# Patient Record
Sex: Male | Born: 1970 | Race: White | Hispanic: No | Marital: Married | Smoking: Never smoker
Health system: Southern US, Community
[De-identification: ages and names within clinical notes are randomized; demographics above are authoritative.]

## PROBLEM LIST (undated history)

## (undated) DIAGNOSIS — E119 Type 2 diabetes mellitus without complications: Secondary | ICD-10-CM

## (undated) DIAGNOSIS — F32A Depression, unspecified: Secondary | ICD-10-CM

## (undated) DIAGNOSIS — K219 Gastro-esophageal reflux disease without esophagitis: Secondary | ICD-10-CM

## (undated) DIAGNOSIS — N189 Chronic kidney disease, unspecified: Secondary | ICD-10-CM

## (undated) DIAGNOSIS — J189 Pneumonia, unspecified organism: Secondary | ICD-10-CM

## (undated) DIAGNOSIS — F419 Anxiety disorder, unspecified: Secondary | ICD-10-CM

## (undated) DIAGNOSIS — D649 Anemia, unspecified: Secondary | ICD-10-CM

## (undated) DIAGNOSIS — Z8489 Family history of other specified conditions: Secondary | ICD-10-CM

## (undated) DIAGNOSIS — Z9289 Personal history of other medical treatment: Secondary | ICD-10-CM

## (undated) DIAGNOSIS — I10 Essential (primary) hypertension: Secondary | ICD-10-CM

## (undated) DIAGNOSIS — I4891 Unspecified atrial fibrillation: Secondary | ICD-10-CM

## (undated) DIAGNOSIS — U071 COVID-19: Secondary | ICD-10-CM

## (undated) HISTORY — PX: VITRECTOMY: SHX106

## (undated) HISTORY — PX: TRACHEOSTOMY: SUR1362

---

## 2008-11-12 ENCOUNTER — Encounter: Admission: RE | Admit: 2008-11-12 | Discharge: 2008-11-12 | Payer: Self-pay | Admitting: Emergency Medicine

## 2021-05-19 ENCOUNTER — Emergency Department (HOSPITAL_COMMUNITY): Payer: BC Managed Care – PPO

## 2021-05-19 ENCOUNTER — Inpatient Hospital Stay (HOSPITAL_COMMUNITY)
Admission: EM | Admit: 2021-05-19 | Discharge: 2021-05-21 | DRG: 065 | Disposition: A | Discharge status: Home / Self Care | Payer: BC Managed Care – PPO | Attending: Internal Medicine | Admitting: Internal Medicine

## 2021-05-19 DIAGNOSIS — E119 Type 2 diabetes mellitus without complications: Secondary | ICD-10-CM

## 2021-05-19 DIAGNOSIS — E872 Acidosis: Secondary | ICD-10-CM | POA: Diagnosis present

## 2021-05-19 DIAGNOSIS — I639 Cerebral infarction, unspecified: Secondary | ICD-10-CM

## 2021-05-19 DIAGNOSIS — I6381 Other cerebral infarction due to occlusion or stenosis of small artery: Principal | ICD-10-CM | POA: Diagnosis present

## 2021-05-19 DIAGNOSIS — E785 Hyperlipidemia, unspecified: Secondary | ICD-10-CM | POA: Diagnosis present

## 2021-05-19 DIAGNOSIS — D649 Anemia, unspecified: Secondary | ICD-10-CM | POA: Diagnosis present

## 2021-05-19 DIAGNOSIS — I6521 Occlusion and stenosis of right carotid artery: Secondary | ICD-10-CM | POA: Diagnosis present

## 2021-05-19 DIAGNOSIS — E669 Obesity, unspecified: Secondary | ICD-10-CM | POA: Diagnosis present

## 2021-05-19 DIAGNOSIS — N179 Acute kidney failure, unspecified: Secondary | ICD-10-CM | POA: Diagnosis present

## 2021-05-19 DIAGNOSIS — Z6832 Body mass index (BMI) 32.0-32.9, adult: Secondary | ICD-10-CM

## 2021-05-19 DIAGNOSIS — E871 Hypo-osmolality and hyponatremia: Secondary | ICD-10-CM | POA: Diagnosis present

## 2021-05-19 DIAGNOSIS — Z20822 Contact with and (suspected) exposure to covid-19: Secondary | ICD-10-CM | POA: Diagnosis present

## 2021-05-19 DIAGNOSIS — R29700 NIHSS score 0: Secondary | ICD-10-CM | POA: Diagnosis present

## 2021-05-19 DIAGNOSIS — G8194 Hemiplegia, unspecified affecting left nondominant side: Secondary | ICD-10-CM | POA: Diagnosis present

## 2021-05-19 DIAGNOSIS — E1159 Type 2 diabetes mellitus with other circulatory complications: Secondary | ICD-10-CM

## 2021-05-19 DIAGNOSIS — Z7984 Long term (current) use of oral hypoglycemic drugs: Secondary | ICD-10-CM

## 2021-05-19 DIAGNOSIS — N289 Disorder of kidney and ureter, unspecified: Secondary | ICD-10-CM

## 2021-05-19 DIAGNOSIS — R29810 Facial weakness: Secondary | ICD-10-CM | POA: Diagnosis present

## 2021-05-19 DIAGNOSIS — Z7982 Long term (current) use of aspirin: Secondary | ICD-10-CM

## 2021-05-19 DIAGNOSIS — R809 Proteinuria, unspecified: Secondary | ICD-10-CM | POA: Diagnosis present

## 2021-05-19 DIAGNOSIS — Z823 Family history of stroke: Secondary | ICD-10-CM

## 2021-05-19 DIAGNOSIS — R1310 Dysphagia, unspecified: Secondary | ICD-10-CM | POA: Diagnosis present

## 2021-05-19 DIAGNOSIS — Z79899 Other long term (current) drug therapy: Secondary | ICD-10-CM

## 2021-05-19 DIAGNOSIS — I10 Essential (primary) hypertension: Secondary | ICD-10-CM | POA: Diagnosis present

## 2021-05-19 HISTORY — DX: Type 2 diabetes mellitus without complications: E11.9

## 2021-05-19 HISTORY — DX: Essential (primary) hypertension: I10

## 2021-05-19 HISTORY — DX: Cerebral infarction, unspecified: I63.9

## 2021-05-19 LAB — COMPREHENSIVE METABOLIC PANEL
ALT: 20 U/L (ref 0–44)
AST: 19 U/L (ref 15–41)
Albumin: 3.4 g/dL — ABNORMAL LOW (ref 3.5–5.0)
Alkaline Phosphatase: 94 U/L (ref 38–126)
Anion gap: 9 (ref 5–15)
BUN: 44 mg/dL — ABNORMAL HIGH (ref 6–20)
CO2: 20 mmol/L — ABNORMAL LOW (ref 22–32)
Calcium: 8.9 mg/dL (ref 8.9–10.3)
Chloride: 103 mmol/L (ref 98–111)
Creatinine, Ser: 4.6 mg/dL — ABNORMAL HIGH (ref 0.61–1.24)
GFR, Estimated: 15 mL/min — ABNORMAL LOW (ref >60)
Glucose, Bld: 174 mg/dL — ABNORMAL HIGH (ref 70–99)
Potassium: 4.6 mmol/L (ref 3.5–5.1)
Sodium: 132 mmol/L — ABNORMAL LOW (ref 135–145)
Total Bilirubin: 0.6 mg/dL (ref 0.3–1.2)
Total Protein: 6.9 g/dL (ref 6.5–8.1)

## 2021-05-19 LAB — I-STAT CHEM 8, ED
BUN: 43 mg/dL — ABNORMAL HIGH (ref 6–20)
Calcium, Ion: 1.22 mmol/L (ref 1.15–1.40)
Chloride: 106 mmol/L (ref 98–111)
Creatinine, Ser: 5 mg/dL — ABNORMAL HIGH (ref 0.61–1.24)
Glucose, Bld: 173 mg/dL — ABNORMAL HIGH (ref 70–99)
HCT: 32 % — ABNORMAL LOW (ref 39.0–52.0)
Hemoglobin: 10.9 g/dL — ABNORMAL LOW (ref 13.0–17.0)
Potassium: 4.7 mmol/L (ref 3.5–5.1)
Sodium: 136 mmol/L (ref 135–145)
TCO2: 20 mmol/L — ABNORMAL LOW (ref 22–32)

## 2021-05-19 LAB — URINALYSIS, ROUTINE W REFLEX MICROSCOPIC
Bacteria, UA: NONE SEEN
Bilirubin Urine: NEGATIVE
Glucose, UA: 50 mg/dL — AB
Hgb urine dipstick: NEGATIVE
Ketones, ur: NEGATIVE mg/dL
Leukocytes,Ua: NEGATIVE
Nitrite: NEGATIVE
Protein, ur: >=300 mg/dL — AB
Specific Gravity, Urine: 1.018 (ref 1.005–1.030)
pH: 5 (ref 5.0–8.0)

## 2021-05-19 LAB — DIFFERENTIAL
Abs Immature Granulocytes: 0.06 10*3/uL (ref 0.00–0.07)
Basophils Absolute: 0.1 10*3/uL (ref 0.0–0.1)
Basophils Relative: 1 %
Eosinophils Absolute: 0.8 10*3/uL — ABNORMAL HIGH (ref 0.0–0.5)
Eosinophils Relative: 8 %
Immature Granulocytes: 1 %
Lymphocytes Relative: 20 %
Lymphs Abs: 2 10*3/uL (ref 0.7–4.0)
Monocytes Absolute: 0.7 10*3/uL (ref 0.1–1.0)
Monocytes Relative: 7 %
Neutro Abs: 6.1 10*3/uL (ref 1.7–7.7)
Neutrophils Relative %: 63 %

## 2021-05-19 LAB — CBC
HCT: 32.1 % — ABNORMAL LOW (ref 39.0–52.0)
Hemoglobin: 11 g/dL — ABNORMAL LOW (ref 13.0–17.0)
MCH: 29.3 pg (ref 26.0–34.0)
MCHC: 34.3 g/dL (ref 30.0–36.0)
MCV: 85.4 fL (ref 80.0–100.0)
Platelets: 280 10*3/uL (ref 150–400)
RBC: 3.76 MIL/uL — ABNORMAL LOW (ref 4.22–5.81)
RDW: 12.4 % (ref 11.5–15.5)
WBC: 9.7 10*3/uL (ref 4.0–10.5)
nRBC: 0 % (ref 0.0–0.2)

## 2021-05-19 LAB — RAPID URINE DRUG SCREEN, HOSP PERFORMED
Amphetamines: NOT DETECTED
Barbiturates: NOT DETECTED
Benzodiazepines: NOT DETECTED
Cocaine: NOT DETECTED
Opiates: NOT DETECTED
Tetrahydrocannabinol: NOT DETECTED

## 2021-05-19 LAB — PROTIME-INR
INR: 0.9 (ref 0.8–1.2)
Prothrombin Time: 11.9 seconds (ref 11.4–15.2)

## 2021-05-19 LAB — CBG MONITORING, ED: Glucose-Capillary: 177 mg/dL — ABNORMAL HIGH (ref 70–99)

## 2021-05-19 LAB — ETHANOL: Alcohol, Ethyl (B): <10 mg/dL (ref <10)

## 2021-05-19 LAB — APTT: aPTT: 25 seconds (ref 24–36)

## 2021-05-19 MED ORDER — LORAZEPAM 1 MG PO TABS
1.0000 mg | ORAL_TABLET | Freq: Once | ORAL | Status: AC
  Administered 2021-05-19: 1 mg via ORAL
  Filled 2021-05-19: qty 1

## 2021-05-19 NOTE — ED Triage Notes (Signed)
Pt c/o weakness since yesterday, worsening since this AM 0500; states L side feels weaker than R. Slight L droop noted to mouth, pt feels he is slurring.No drop/drift noted in extremities. Familial hx stroke  HTN, compliant w medication

## 2021-05-19 NOTE — ED Provider Notes (Signed)
Emergency Medicine Provider Triage Evaluation Note  Dakwan Redondo Muscat , a 50 y.o. male  was evaluated in triage.  Pt complains of left sided weakness. Pt states he woke up with his sx around 5 am. He states his "head was racing". He noticed his speech felt slurred and he felt more weak than normal in his left leg and arm. He states he slept all day but his sx didn't resolve so he came to the ED. No numbness, tingling, visual changes.   Physical Exam  BP (!) 178/109 (BP Location: Right Arm)   Pulse (!) 101   Temp 98.3 F (36.8 C) (Oral)   Resp 16   Ht '6\' 2"'$  (1.88 m)   Wt 113.4 kg   SpO2 100%   BMI 32.10 kg/m  Gen:   Awake and alert Resp:  Normal effort  MSK:   Moves extremities without difficulty  Other:  Mild droop noted at the left corner of the mouth. EOMI. Strength is 5/5 is all four extremities. Visual fields grossly intact.   Medical Decision Making  Medically screening exam initiated at 7:45 PM.  Appropriate orders placed.  Manley B Doto was informed that the remainder of the evaluation will be completed by another provider, this initial triage assessment does not replace that evaluation, and the importance of remaining in the ED until their evaluation is complete.  Only objective finding at this time is small amount of droop at left corner of mouth. Outside of TPA window. Exam not consistent with LVO.  Will obtain blood work as well as an MRI of the brain. Discussed with attending physician who is in agreement.    Rayna Sexton, PA-C 05/19/21 1955    Lucrezia Starch, MD 05/23/21 1116

## 2021-05-20 ENCOUNTER — Inpatient Hospital Stay (HOSPITAL_COMMUNITY): Payer: BC Managed Care – PPO

## 2021-05-20 ENCOUNTER — Encounter (HOSPITAL_COMMUNITY): Payer: Self-pay | Admitting: Internal Medicine

## 2021-05-20 DIAGNOSIS — R29700 NIHSS score 0: Secondary | ICD-10-CM | POA: Diagnosis present

## 2021-05-20 DIAGNOSIS — Z7982 Long term (current) use of aspirin: Secondary | ICD-10-CM | POA: Diagnosis not present

## 2021-05-20 DIAGNOSIS — E1159 Type 2 diabetes mellitus with other circulatory complications: Secondary | ICD-10-CM

## 2021-05-20 DIAGNOSIS — I10 Essential (primary) hypertension: Secondary | ICD-10-CM | POA: Diagnosis present

## 2021-05-20 DIAGNOSIS — Z823 Family history of stroke: Secondary | ICD-10-CM | POA: Diagnosis not present

## 2021-05-20 DIAGNOSIS — Z79899 Other long term (current) drug therapy: Secondary | ICD-10-CM | POA: Diagnosis not present

## 2021-05-20 DIAGNOSIS — I639 Cerebral infarction, unspecified: Secondary | ICD-10-CM

## 2021-05-20 DIAGNOSIS — E785 Hyperlipidemia, unspecified: Secondary | ICD-10-CM | POA: Diagnosis present

## 2021-05-20 DIAGNOSIS — I6521 Occlusion and stenosis of right carotid artery: Secondary | ICD-10-CM | POA: Diagnosis present

## 2021-05-20 DIAGNOSIS — E872 Acidosis: Secondary | ICD-10-CM | POA: Diagnosis present

## 2021-05-20 DIAGNOSIS — N289 Disorder of kidney and ureter, unspecified: Secondary | ICD-10-CM

## 2021-05-20 DIAGNOSIS — R809 Proteinuria, unspecified: Secondary | ICD-10-CM | POA: Diagnosis present

## 2021-05-20 DIAGNOSIS — E871 Hypo-osmolality and hyponatremia: Secondary | ICD-10-CM | POA: Diagnosis present

## 2021-05-20 DIAGNOSIS — R1310 Dysphagia, unspecified: Secondary | ICD-10-CM | POA: Diagnosis present

## 2021-05-20 DIAGNOSIS — I6389 Other cerebral infarction: Secondary | ICD-10-CM

## 2021-05-20 DIAGNOSIS — N179 Acute kidney failure, unspecified: Secondary | ICD-10-CM | POA: Diagnosis present

## 2021-05-20 DIAGNOSIS — E119 Type 2 diabetes mellitus without complications: Secondary | ICD-10-CM | POA: Diagnosis present

## 2021-05-20 DIAGNOSIS — I160 Hypertensive urgency: Secondary | ICD-10-CM | POA: Diagnosis not present

## 2021-05-20 DIAGNOSIS — I6381 Other cerebral infarction due to occlusion or stenosis of small artery: Secondary | ICD-10-CM | POA: Diagnosis present

## 2021-05-20 DIAGNOSIS — G8194 Hemiplegia, unspecified affecting left nondominant side: Secondary | ICD-10-CM | POA: Diagnosis present

## 2021-05-20 DIAGNOSIS — Z20822 Contact with and (suspected) exposure to covid-19: Secondary | ICD-10-CM | POA: Diagnosis present

## 2021-05-20 DIAGNOSIS — R29810 Facial weakness: Secondary | ICD-10-CM | POA: Diagnosis present

## 2021-05-20 DIAGNOSIS — D649 Anemia, unspecified: Secondary | ICD-10-CM | POA: Diagnosis present

## 2021-05-20 DIAGNOSIS — Z7984 Long term (current) use of oral hypoglycemic drugs: Secondary | ICD-10-CM | POA: Diagnosis not present

## 2021-05-20 DIAGNOSIS — Z6832 Body mass index (BMI) 32.0-32.9, adult: Secondary | ICD-10-CM | POA: Diagnosis not present

## 2021-05-20 DIAGNOSIS — E669 Obesity, unspecified: Secondary | ICD-10-CM | POA: Diagnosis present

## 2021-05-20 HISTORY — DX: Cerebral infarction, unspecified: I63.9

## 2021-05-20 LAB — CBG MONITORING, ED
Glucose-Capillary: 180 mg/dL — ABNORMAL HIGH (ref 70–99)
Glucose-Capillary: 221 mg/dL — ABNORMAL HIGH (ref 70–99)
Glucose-Capillary: 224 mg/dL — ABNORMAL HIGH (ref 70–99)

## 2021-05-20 LAB — ECHOCARDIOGRAM COMPLETE
Height: 74 in
S' Lateral: 3.9 cm
Weight: 4000 oz

## 2021-05-20 LAB — HEMOGLOBIN A1C
Hgb A1c MFr Bld: 7.4 % — ABNORMAL HIGH (ref 4.8–5.6)
Mean Plasma Glucose: 165.68 mg/dL

## 2021-05-20 LAB — HIV ANTIBODY (ROUTINE TESTING W REFLEX): HIV Screen 4th Generation wRfx: NONREACTIVE

## 2021-05-20 LAB — SODIUM, URINE, RANDOM: Sodium, Ur: 73 mmol/L

## 2021-05-20 LAB — PROTEIN / CREATININE RATIO, URINE
Creatinine, Urine: 68.97 mg/dL
Protein Creatinine Ratio: 4.36 mg/mg{Cre} — ABNORMAL HIGH (ref 0.00–0.15)
Total Protein, Urine: 301 mg/dL

## 2021-05-20 LAB — PROTEIN, URINE, RANDOM: Total Protein, Urine: 291 mg/dL

## 2021-05-20 LAB — CREATININE, URINE, RANDOM: Creatinine, Urine: 70.34 mg/dL

## 2021-05-20 LAB — GLUCOSE, CAPILLARY: Glucose-Capillary: 199 mg/dL — ABNORMAL HIGH (ref 70–99)

## 2021-05-20 LAB — CK: Total CK: 253 U/L (ref 49–397)

## 2021-05-20 MED ORDER — ACETAMINOPHEN 160 MG/5ML PO SOLN: 650.0000 mg | ORAL | Status: DC | PRN

## 2021-05-20 MED ORDER — LABETALOL HCL 5 MG/ML IV SOLN
5.0000 mg | INTRAVENOUS | Status: DC | PRN
  Administered 2021-05-21: 5 mg via INTRAVENOUS
  Filled 2021-05-20: qty 4

## 2021-05-20 MED ORDER — CLOPIDOGREL BISULFATE 75 MG PO TABS
75.0000 mg | ORAL_TABLET | Freq: Every day | ORAL | Status: DC
  Administered 2021-05-20: 75 mg via ORAL
  Filled 2021-05-20: qty 1

## 2021-05-20 MED ORDER — SODIUM CHLORIDE 0.9 % IV BOLUS
1000.0000 mL | Freq: Once | INTRAVENOUS | Status: AC
  Administered 2021-05-20: 1000 mL via INTRAVENOUS

## 2021-05-20 MED ORDER — STROKE: EARLY STAGES OF RECOVERY BOOK
Freq: Once | Status: AC
  Filled 2021-05-20: qty 1

## 2021-05-20 MED ORDER — PERFLUTREN LIPID MICROSPHERE
1.0000 mL | INTRAVENOUS | Status: AC | PRN
Start: 2021-05-20 — End: 2021-05-20
  Administered 2021-05-20: 2 mL via INTRAVENOUS
  Filled 2021-05-20: qty 10

## 2021-05-20 MED ORDER — HEPARIN SODIUM (PORCINE) 5000 UNIT/ML IJ SOLN
5000.0000 [IU] | Freq: Three times a day (TID) | INTRAMUSCULAR | Status: DC
  Administered 2021-05-20 – 2021-05-21 (×5): 5000 [IU] via SUBCUTANEOUS
  Filled 2021-05-20 (×5): qty 1

## 2021-05-20 MED ORDER — ASPIRIN 300 MG RE SUPP: 300.0000 mg | Freq: Every day | RECTAL | Status: DC

## 2021-05-20 MED ORDER — LIRAGLUTIDE 18 MG/3ML ~~LOC~~ SOPN: 1.8000 mg | PEN_INJECTOR | Freq: Every day | SUBCUTANEOUS | Status: DC

## 2021-05-20 MED ORDER — HYDROXYZINE HCL 25 MG PO TABS: 12.5000 mg | ORAL_TABLET | Freq: Three times a day (TID) | ORAL | Status: DC | PRN

## 2021-05-20 MED ORDER — ASPIRIN 325 MG PO TABS
325.0000 mg | ORAL_TABLET | Freq: Every day | ORAL | Status: DC
  Administered 2021-05-20: 325 mg via ORAL
  Filled 2021-05-20: qty 1

## 2021-05-20 MED ORDER — ZOLPIDEM TARTRATE 5 MG PO TABS: 5.0000 mg | ORAL_TABLET | Freq: Every evening | ORAL | Status: DC | PRN

## 2021-05-20 MED ORDER — ACETAMINOPHEN 325 MG PO TABS: 650.0000 mg | ORAL_TABLET | ORAL | Status: DC | PRN

## 2021-05-20 MED ORDER — ADULT MULTIVITAMIN W/MINERALS CH
1.0000 | ORAL_TABLET | Freq: Every day | ORAL | Status: DC
  Administered 2021-05-20 – 2021-05-21 (×2): 1 via ORAL
  Filled 2021-05-20 (×2): qty 1

## 2021-05-20 MED ORDER — ENOXAPARIN SODIUM 40 MG/0.4ML IJ SOSY: 40.0000 mg | PREFILLED_SYRINGE | INTRAMUSCULAR | Status: DC

## 2021-05-20 MED ORDER — ASPIRIN 81 MG PO CHEW
324.0000 mg | CHEWABLE_TABLET | Freq: Once | ORAL | Status: AC
  Administered 2021-05-20: 324 mg via ORAL
  Filled 2021-05-20: qty 4

## 2021-05-20 MED ORDER — ALBUTEROL SULFATE (2.5 MG/3ML) 0.083% IN NEBU: 2.5000 mg | INHALATION_SOLUTION | Freq: Four times a day (QID) | RESPIRATORY_TRACT | Status: DC | PRN

## 2021-05-20 MED ORDER — ACETAMINOPHEN 650 MG RE SUPP: 650.0000 mg | RECTAL | Status: DC | PRN

## 2021-05-20 MED ORDER — ASPIRIN EC 81 MG PO TBEC
81.0000 mg | DELAYED_RELEASE_TABLET | Freq: Every day | ORAL | Status: DC
  Administered 2021-05-21: 81 mg via ORAL
  Filled 2021-05-20: qty 1

## 2021-05-20 MED ORDER — INSULIN ASPART 100 UNIT/ML IJ SOLN
0.0000 [IU] | Freq: Three times a day (TID) | INTRAMUSCULAR | Status: DC
  Administered 2021-05-20 (×3): 3 [IU] via SUBCUTANEOUS
  Administered 2021-05-21 (×2): 2 [IU] via SUBCUTANEOUS

## 2021-05-20 MED ORDER — PANTOPRAZOLE SODIUM 40 MG PO TBEC
40.0000 mg | DELAYED_RELEASE_TABLET | Freq: Every day | ORAL | Status: DC
  Administered 2021-05-20 – 2021-05-21 (×2): 40 mg via ORAL
  Filled 2021-05-20 (×2): qty 1

## 2021-05-20 NOTE — ED Notes (Signed)
Pt provided snack bag per request

## 2021-05-20 NOTE — ED Notes (Signed)
Patient last urinated at 1400 hours with 700 ml out. 24 hour urine collection started at 1400 hours.

## 2021-05-20 NOTE — Consult Note (Addendum)
NEURO HOSPITALIST CONSULT NOTE   Requestig physician: Dr. Sedonia Small  Reason for Consult: Left sided weakness  History obtained from:  Patient and Chart     HPI:                                                                                                                                          Bruce Little is an 50 y.o. male with a PMHx of DM2 and HTN who presented to the ED on Monday evening with left sided weakness since Sunday. He stated that the weakness started to further worsen starting at about 0500 on Monday. He also complained of slurred speech. In Triage, he was noted to have a slight left facial droop. He tried to "sleep it off" during a portion of the day, and also did some shopping and drove to [place], but the symptoms persisted, so he came to the ED for evaluation. He denied visual changes, numbness or tingling. BP was 178/109 on presentation, with pulse of 101, RR 16 and O2 saturation of 100%.   Of note, the patient's BUN is 43 with an elevated Cr of 5 and eGFR of 15. Calcium and Na are normal. Blood glucose is 173. Transaminases are normal. Normal white count. Mild anemia. Tox screen is negative. EtOH < 10.   The patient states that he has not been as focused on his DM and HTN as he should have been, but after this stroke he has decided to put in a high level of effort towards improving his health.   PMHx DM2 HTN  FHx Stroke (mother)            Social History:  has no history on file for tobacco use, alcohol use, and drug use.  No Known Allergies  HOME MEDICATIONS:                                                                                                                     No current facility-administered medications on file prior to encounter.   Current Outpatient Medications on File Prior to Encounter  Medication Sig Dispense Refill   albuterol (VENTOLIN HFA) 108 (90 Base) MCG/ACT inhaler Inhale 1-2 puffs into the lungs every 6 (six) hours  as needed for wheezing or shortness of  breath.     aspirin EC 81 MG tablet Take 81 mg by mouth daily. Swallow whole.     glyBURIDE-metformin (GLUCOVANCE) 5-500 MG tablet Take 2 tablets by mouth 2 (two) times daily.     hydrOXYzine (ATARAX/VISTARIL) 25 MG tablet Take 12.5-25 mg by mouth 3 (three) times daily as needed for anxiety.     losartan (COZAAR) 100 MG tablet Take 100 mg by mouth daily.     multivitamin (ONE-A-DAY MEN'S) TABS tablet Take 1 tablet by mouth daily.     omeprazole (PRILOSEC OTC) 20 MG tablet Take 20 mg by mouth daily.     VICTOZA 18 MG/3ML SOPN Inject 1.8 mg into the skin daily.     zolpidem (AMBIEN) 10 MG tablet Take 5-10 mg by mouth at bedtime as needed for sleep.       ROS:                                                                                                                                       Positive for difficulty swallowing. No headache. Other symptoms as per HPI, with comprehensive ROS otherwise negative.     Blood pressure (!) 195/133, pulse 96, temperature 98.1 F (36.7 C), temperature source Oral, resp. rate 18, height _0  (1.88 m), weight 113.4 kg, SpO2 100 %.   General Examination:                                                                                                       Physical Exam  HEENT-  [place]/AT   Lungs- Respirations unlabored Extremities- No edema. Varicose veins are noted to BLE.   Neurological Examination Mental Status: Alert, fully oriented, thought content appropriate.  Speech fluent without evidence of aphasia.  Able to follow all commands without difficulty. Subtle dysarthria.  Cranial Nerves: II: Visual fields intact with no extinction to DSS.   III,IV, VI: Mild left ptosis. EOMI. No nystagmus. Saccadic pursuits noted when gazing to the left.  V,VII: Temp sensation equal bilaterally. Subtle left facial droop.  VIII: Hearing intact to conversation IX,X: No hypophonia XI: Head is midline XII: Midline tongue  extension Motor: RUE and RLE 5/5 LUE 4/5 LLE 4+/5 Sensory: Temp and light touch intact throughout, bilaterally. No extinction to DSS.  Deep Tendon Reflexes: 2+ and symmetric throughout Plantars: Right: downgoing   Left: downgoing Cerebellar: Right FNF normal. Left FNF with mild dystaxia.  Gait: Deferred   Lab Results: Basic Metabolic Panel: Recent Labs  Lab  05/19/21 1950 05/19/21 2044  NA 132* 136  K 4.6 4.7  CL 103 106  CO2 20*  --   GLUCOSE 174* 173*  BUN 44* 43*  CREATININE 4.60* 5.00*  CALCIUM 8.9  --     CBC: Recent Labs  Lab 05/19/21 1950 05/19/21 2044  WBC 9.7  --   NEUTROABS 6.1  --   HGB 11.0* 10.9*  HCT 32.1* 32.0*  MCV 85.4  --   PLT 280  --     Cardiac Enzymes: No results for input(s): CKTOTAL, CKMB, CKMBINDEX, TROPONINI in the last 168 hours.  Lipid Panel: No results for input(s): CHOL, TRIG, HDL, CHOLHDL, VLDL, LDLCALC in the last 168 hours.  Imaging: MR ANGIO HEAD WO CONTRAST  Result Date: 05/20/2021 CLINICAL DATA:  Left facial droop EXAM: MRI HEAD WITHOUT CONTRAST MRA HEAD WITHOUT CONTRAST TECHNIQUE: Multiplanar, multi-echo pulse sequences of the brain and surrounding structures were acquired without intravenous contrast. Angiographic images of the Circle of Willis were acquired using MRA technique without intravenous contrast. COMPARISON:  No pertinent prior exam. FINDINGS: MRI HEAD FINDINGS Brain: Small acute/early subacute infarct the right caudate body. 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 There is multifocal hyperintense T2-weighted signal within the white matter. Parenchymal volume and CSF spaces are normal. 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 The midline structures are normal. Vascular: Major flow voids are preserved. Skull and upper cervical spine: Normal calvarium and skull base. Visualized upper cervical spine and soft tissues are normal. Sinuses/Orbits:No paranasal sinus fluid levels  or advanced mucosal thickening. No mastoid or middle ear effusion. Normal orbits. MRA HEAD FINDINGS POSTERIOR CIRCULATION: --Vertebral arteries: Normal --Inferior cerebellar arteries: Normal. --Basilar artery: Normal. --Superior cerebellar arteries: Normal. --Posterior cerebral arteries: Mild stenosis of the proximal left P2 segment. Normal right PCA. ANTERIOR CIRCULATION: --Intracranial internal carotid arteries: Normal. --Anterior cerebral arteries (ACA): Normal. --Middle cerebral arteries (MCA): Normal. ANATOMIC VARIANTS: None IMPRESSION: 1. Small acute/early subacute infarct of the right caudate body. No hemorrhage or mass effect. 2. 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 3. Mild stenosis of the proximal left P2 segment. Electronically Signed   By: Ulyses Jarred M.D.   On: 05/20/2021 00:08   MR BRAIN WO CONTRAST  Result Date: 05/20/2021 CLINICAL DATA:  Left facial droop EXAM: MRI HEAD WITHOUT CONTRAST MRA HEAD WITHOUT CONTRAST TECHNIQUE: Multiplanar, multi-echo pulse sequences of the brain and surrounding structures were acquired without intravenous contrast. Angiographic images of the Circle of Willis were acquired using MRA technique without intravenous contrast. COMPARISON:  No pertinent prior exam. FINDINGS: MRI HEAD FINDINGS Brain: Small acute/early subacute infarct the right caudate body. 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 There is multifocal hyperintense T2-weighted signal within the white matter. Parenchymal volume and CSF spaces are normal. 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 The midline structures are normal. Vascular: Major flow voids are preserved. Skull and upper cervical spine: Normal calvarium and skull base. Visualized upper cervical spine and soft tissues are normal. Sinuses/Orbits:No paranasal sinus fluid levels or advanced mucosal thickening. No mastoid or middle ear effusion. Normal orbits. MRA HEAD FINDINGS  POSTERIOR CIRCULATION: --Vertebral arteries: Normal --Inferior cerebellar arteries: Normal. --Basilar artery: Normal. --Superior cerebellar arteries: Normal. --Posterior cerebral arteries: Mild stenosis of the proximal left P2 segment. Normal right PCA. ANTERIOR CIRCULATION: --Intracranial internal carotid arteries: Normal. --Anterior cerebral arteries (ACA): Normal. --Middle cerebral arteries (MCA): Normal. ANATOMIC VARIANTS: None IMPRESSION: 1. Small acute/early subacute  infarct of the right caudate body. No hemorrhage or mass effect. 2. 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 3. Mild stenosis of the proximal left P2 segment. Electronically Signed   By: Ulyses Jarred M.D.   On: 05/20/2021 00:08      Assessment: 50 year old male presenting with left sided numbness and weakness. MRI reveals a subacute ischemic infarction in the right caudate body.  1. Exam reveals left sided motor deficits.  2. MRI brain: Small early subacute infarct of the right caudate body. 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 Chronic small vessel ischemic changes. 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  3. MRA head: Mild stenosis of the proximal left P2 segment. 4. EKG: Sinus tachycardia. Minimal voltage criteria for LVH, may be normal variant.  5. BUN/Cr most consistent with AKI, possibly on undiagnosed CKD 6. NIHSS: 4  Recommendations: 1. TTE 2. Carotid ultrasound 3. PT consult, OT consult, Speech consult 4. HgbA1c, fasting lipid panel 5. Start atorvastatin 40 mg po qd 6. Prophylactic therapy- Add Plavix to ASA  7. Risk factor modification 8. Telemetry monitoring 9. Frequent neuro checks 10. NPO until passes stroke swallow screen 11. Management of AKI and work up for underlying etiology.    Electronically signed: Dr. Kerney Elbe 05/20/2021, 5:34 AM

## 2021-05-20 NOTE — H&P (Signed)
History and Physical    Bruce Little [id number] DOB: 14-Jun-1971 DOA: 05/19/2021  PCP: [facility]  Patient coming from: Home  I have personally briefly reviewed patient's old medical records in [place]  Chief Complaint: L sided weakness  HPI: Bruce Little is a 50 y.o. male with medical history significant of DM2, HTN.  Pt woke up 5AM Monday morning with L sided numbness and weakness.  Symptoms were mild.  Decided to monitor symptoms and go about his day.  Symptoms persisted without improving, began to develop intermittent fatigue.  Presents to ED for eval.  No pain, no headache, no vision change.   ED Course: Pt with acute ischemic stroke on MRI.  BPs running [id number] systolic.  Also has creat of 5, no PMH of renal issues and pt is on metformin.   Review of Systems: As per HPI, otherwise all review of systems negative.  Past Medical History:  Diagnosis Date   DM2 (diabetes mellitus, type 2) ([place])    HTN (hypertension)     History reviewed. No pertinent surgical history.   reports current alcohol use. He reports that he does not use drugs. No history on file for tobacco use.  No Known Allergies  Family History  Problem Relation Age of Onset   Stroke Mother      Prior to Admission medications   Medication Sig Start Date End Date Taking? Authorizing Provider  albuterol (VENTOLIN HFA) 108 (90 Base) MCG/ACT inhaler Inhale 1-2 puffs into the lungs every 6 (six) hours as needed for wheezing or shortness of breath. 12/09/20  Yes [provider]  aspirin EC 81 MG tablet Take 81 mg by mouth daily. Swallow whole.   Yes [provider]  glyBURIDE-metformin (GLUCOVANCE) 5-500 MG tablet Take 2 tablets by mouth 2 (two) times daily. 03/01/21  Yes [provider]  hydrOXYzine (ATARAX/VISTARIL) 25 MG tablet Take 12.5-25 mg by mouth 3 (three) times daily as needed for anxiety. 03/30/21  Yes [provider]  losartan (COZAAR)  100 MG tablet Take 100 mg by mouth daily. 03/01/21  Yes [provider]  multivitamin (ONE-A-DAY MEN'S) TABS tablet Take 1 tablet by mouth daily.   Yes [provider]  omeprazole (PRILOSEC OTC) 20 MG tablet Take 20 mg by mouth daily.   Yes [provider]  VICTOZA 18 MG/3ML SOPN Inject 1.8 mg into the skin daily. 05/07/21  Yes [provider]  zolpidem (AMBIEN) 10 MG tablet Take 5-10 mg by mouth at bedtime as needed for sleep. 11/28/20  Yes [provider]    Physical Exam: Vitals:   05/20/21 0419 05/20/21 0430 05/20/21 0445 05/20/21 0500  BP: (!) 201/109 (!) 200/122 (!) 203/115 (!) 195/133  Pulse: 98 92 91 96  Resp: '18 18 20 18  '$ Temp:      TempSrc:      SpO2: 98% 98% 100% 100%  Weight:      Height:        Constitutional: NAD, calm, comfortable Eyes: PERRL, lids and conjunctivae normal ENMT: Mucous membranes are moist. Posterior pharynx clear of any exudate or lesions.Normal dentition.  Neck: normal, supple, no masses, no thyromegaly Respiratory: clear to auscultation bilaterally, no wheezing, no crackles. Normal respiratory effort. No accessory muscle use.  Cardiovascular: Regular rate and rhythm, no murmurs / rubs / gallops. No extremity edema. 2+ pedal pulses. No carotid bruits.  Abdomen: no tenderness, no masses palpated. No hepatosplenomegaly. Bowel sounds positive.  Musculoskeletal: no clubbing /  cyanosis. No joint deformity upper and lower extremities. Good ROM, no contractures. Normal muscle tone.  Skin: no rashes, lesions, ulcers. No induration Neurologic: Mild L facial droop, Mild LUE and LLE weakness and mildly decreased sensation. Psychiatric: Normal judgment and insight. Alert and oriented x 3. Normal mood.    Labs on Admission: I have personally reviewed following labs and imaging studies  CBC: Recent Labs  Lab 05/19/21 1950 05/19/21 2044  WBC 9.7  --   NEUTROABS 6.1  --   HGB 11.0* 10.9*  HCT 32.1* 32.0*  MCV 85.4   --   PLT 280  --    Basic Metabolic Panel: Recent Labs  Lab 05/19/21 1950 05/19/21 2044  NA 132* 136  K 4.6 4.7  CL 103 106  CO2 20*  --   GLUCOSE 174* 173*  BUN 44* 43*  CREATININE 4.60* 5.00*  CALCIUM 8.9  --    GFR: Estimated Creatinine Clearance: 24.2 mL/min (A) (by C-G formula based on SCr of 5 mg/dL (H)). Liver Function Tests: Recent Labs  Lab 05/19/21 1950  AST 19  ALT 20  ALKPHOS 94  BILITOT 0.6  PROT 6.9  ALBUMIN 3.4*   No results for input(s): LIPASE, AMYLASE in the last 168 hours. No results for input(s): AMMONIA in the last 168 hours. Coagulation Profile: Recent Labs  Lab 05/19/21 1950  INR 0.9   Cardiac Enzymes: No results for input(s): CKTOTAL, CKMB, CKMBINDEX, TROPONINI in the last 168 hours. BNP (last 3 results) No results for input(s): PROBNP in the last 8760 hours. HbA1C: No results for input(s): HGBA1C in the last 72 hours. CBG: Recent Labs  Lab 05/19/21 1949  GLUCAP 177*   Lipid Profile: No results for input(s): CHOL, HDL, LDLCALC, TRIG, CHOLHDL, LDLDIRECT in the last 72 hours. Thyroid Function Tests: No results for input(s): TSH, T4TOTAL, FREET4, T3FREE, THYROIDAB in the last 72 hours. Anemia Panel: No results for input(s): VITAMINB12, FOLATE, FERRITIN, TIBC, IRON, RETICCTPCT in the last 72 hours. Urine analysis:    Component Value Date/Time   COLORURINE YELLOW 05/19/2021 1950   APPEARANCEUR HAZY (A) 05/19/2021 1950   LABSPEC 1.018 05/19/2021 1950   PHURINE 5.0 05/19/2021 1950   GLUCOSEU 50 (A) 05/19/2021 1950   HGBUR NEGATIVE 05/19/2021 1950   BILIRUBINUR NEGATIVE 05/19/2021 1950   KETONESUR NEGATIVE 05/19/2021 1950   PROTEINUR >=300 (A) 05/19/2021 1950   NITRITE NEGATIVE 05/19/2021 1950   LEUKOCYTESUR NEGATIVE 05/19/2021 1950    Radiological Exams on Admission: MR ANGIO HEAD WO CONTRAST  Result Date: 05/20/2021 CLINICAL DATA:  Left facial droop EXAM: MRI HEAD WITHOUT CONTRAST MRA HEAD WITHOUT CONTRAST TECHNIQUE:  Multiplanar, multi-echo pulse sequences of the brain and surrounding structures were acquired without intravenous contrast. Angiographic images of the Circle of Willis were acquired using MRA technique without intravenous contrast. COMPARISON:  No pertinent prior exam. FINDINGS: MRI HEAD FINDINGS Brain: Small acute/early subacute infarct the right caudate body. 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 There is multifocal hyperintense T2-weighted signal within the white matter. Parenchymal volume and CSF spaces are normal. 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 The midline structures are normal. Vascular: Major flow voids are preserved. Skull and upper cervical spine: Normal calvarium and skull base. Visualized upper cervical spine and soft tissues are normal. Sinuses/Orbits:No paranasal sinus fluid levels or advanced mucosal thickening. No mastoid or middle ear effusion. Normal orbits. MRA HEAD FINDINGS POSTERIOR CIRCULATION: --Vertebral arteries: Normal --Inferior cerebellar arteries: Normal. --Basilar artery: Normal. --Superior  cerebellar arteries: Normal. --Posterior cerebral arteries: Mild stenosis of the proximal left P2 segment. Normal right PCA. ANTERIOR CIRCULATION: --Intracranial internal carotid arteries: Normal. --Anterior cerebral arteries (ACA): Normal. --Middle cerebral arteries (MCA): Normal. ANATOMIC VARIANTS: None IMPRESSION: 1. Small acute/early subacute infarct of the right caudate body. No hemorrhage or mass effect. 2. 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 3. Mild stenosis of the proximal left P2 segment. Electronically Signed   By: Ulyses Jarred M.D.   On: 05/20/2021 00:08   MR BRAIN WO CONTRAST  Result Date: 05/20/2021 CLINICAL DATA:  Left facial droop EXAM: MRI HEAD WITHOUT CONTRAST MRA HEAD WITHOUT CONTRAST TECHNIQUE: Multiplanar, multi-echo pulse sequences of the brain and surrounding structures were acquired without  intravenous contrast. Angiographic images of the Circle of Willis were acquired using MRA technique without intravenous contrast. COMPARISON:  No pertinent prior exam. FINDINGS: MRI HEAD FINDINGS Brain: Small acute/early subacute infarct the right caudate body. 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 There is multifocal hyperintense T2-weighted signal within the white matter. Parenchymal volume and CSF spaces are normal. 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 The midline structures are normal. Vascular: Major flow voids are preserved. Skull and upper cervical spine: Normal calvarium and skull base. Visualized upper cervical spine and soft tissues are normal. Sinuses/Orbits:No paranasal sinus fluid levels or advanced mucosal thickening. No mastoid or middle ear effusion. Normal orbits. MRA HEAD FINDINGS POSTERIOR CIRCULATION: --Vertebral arteries: Normal --Inferior cerebellar arteries: Normal. --Basilar artery: Normal. --Superior cerebellar arteries: Normal. --Posterior cerebral arteries: Mild stenosis of the proximal left P2 segment. Normal right PCA. ANTERIOR CIRCULATION: --Intracranial internal carotid arteries: Normal. --Anterior cerebral arteries (ACA): Normal. --Middle cerebral arteries (MCA): Normal. ANATOMIC VARIANTS: None IMPRESSION: 1. Small acute/early subacute infarct of the right caudate body. No hemorrhage or mass effect. 2. 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 3. Mild stenosis of the proximal left P2 segment. Electronically Signed   By: Ulyses Jarred M.D.   On: 05/20/2021 00:08    EKG: Independently reviewed.  Assessment/Plan Principal Problem:   Acute ischemic stroke [facility]) Active Problems:   Renal insufficiency   DM2 (diabetes mellitus, type 2) (HCC)    Acute ischemic stroke - Stroke pathway Neuro consult Tele monitor 2d echo MRA head, US carotid dopplers PT/OT/SLP ASA 325 for the moment Renal insufficiency  - Never been told he had renal insufficiency in past Not sure how much of this is acute vs chronic. UA shows proteinuria Getting renal US Strict intake and output Consult nephrology in AM Hold Losartan and metformin DM2 - Hold metformin Cont victoza Sensitive SSI AC HTN - Holding home meds and allowing permissive HTN in setting of acute stroke PRN labetalol if SBP > 220 or DBP > 110  DVT prophylaxis: Heparin [place] Code Status: Full Family Communication: No family in room Disposition Plan: Home after stroke work up and Renal work up / treatment Consults called: EDP called neuro for consult Admission status: Admit to inpatient  Severity of Illness: The appropriate patient status for this patient is INPATIENT. Inpatient status is judged to be reasonable and necessary in order to provide the required intensity of service to ensure the patient's safety. The patient's presenting symptoms, physical exam findings, and initial radiographic and laboratory data in the context of their chronic comorbidities is felt to place them at high risk for further clinical deterioration. Furthermore, it is not anticipated that the patient will be medically stable for discharge from the hospital  within 2 midnights of admission. The following factors support the patient status of inpatient.   Patient has acute kidney injury.  Patient has one of the following: Increase in Serum Creatinine >0.3 mg/dL within 48h Increase in Serum Creatinine > 1.5 times baseline known or presumed to have been within the last 7 days Urine volume < 0.5 ml/kg/hr for 6 hours  Pt with acute ischemic stroke demonstrated on MRI.   * I certify that at the point of admission it is my clinical judgment that the patient will require inpatient hospital care spanning beyond 2 midnights from the point of admission due to high intensity of service, high risk for further deterioration and high frequency of surveillance required.*   Sohum Delillo,  Maxden Naji M. DO [facility]  How to contact the [facility] Attending or Consulting provider [place] or covering provider during after hours [place], for this patient?  Check the care team in [facility] and look for a) attending/consulting [facility] provider listed and b) the [facility] team listed Log into [email or web address]  Amion Physician Scheduling and messaging for groups and whole hospitals  On call and physician scheduling software for group practices, residents, hospitalists and other medical providers for call, clinic, rotation and shift schedules. OnCall [employer] is a hospital-wide system for scheduling doctors and paging doctors on call. EasyPlot is for scientific plotting and data analysis.  [email or web address]  and use [place]'s universal password to access. If you do not have the password, please contact the hospital operator.  Locate the [facility] provider you are looking for under [facility] and page to a number that you can be directly reached. If you still have difficulty reaching the provider, please page the [facility] (Director on Call) for the Hospitalists listed on amion for assistance.  05/20/2021, 5:36 AM

## 2021-05-20 NOTE — ED Provider Notes (Signed)
[facility] Emergency Department Provider Note MRN:  [id number]  Arrival date & time: 05/20/21     Chief Complaint   Weakness   History of Present Illness   Bruce Little is a 50 y.o. year-old male with no pertinent past medical history presenting to the ED with chief complaint of weakness.  Went to bed Sunday evening at midnight.  Woke up at 5 AM Monday morning with left sided numbness and weakness, symptoms mild.  Decided to monitor symptoms and go on with his day, did some shopping, drove to [place].  Symptoms not improving.  Intermittently having some general malaise and fatigue.  Here for evaluation.  Denies any pain, no headache.  Had some trouble swallowing earlier in the day.  No vision change.  Some left facial droop.  Review of Systems  A complete 10 system review of systems was obtained and all systems are negative except as noted in the HPI and PMH.   Patient's Health History   No past medical history on file.    No family history on file.  Social History   Socioeconomic History   Marital status: Unknown    Spouse name: Not on file   Number of children: Not on file   Years of education: Not on file   Highest education level: Not on file  Occupational History   Not on file  Tobacco Use   Smoking status: Not on file   Smokeless tobacco: Not on file  Substance and Sexual Activity   Alcohol use: Not on file   Drug use: Not on file   Sexual activity: Not on file  Other Topics Concern   Not on file  Social History Narrative   Not on file   Social Determinants of Health   Financial Resource Strain: Not on file  Food Insecurity: Not on file  Transportation Needs: Not on file  Physical Activity: Not on file  Stress: Not on file  Social Connections: Not on file  Intimate Partner Violence: Not on file     Physical Exam   Vitals:   05/20/21 0419 05/20/21 0430  BP: (!) 201/109 (!) 200/122  Pulse: 98 92  Resp: 18 18  Temp:     SpO2: 98% 98%    CONSTITUTIONAL: Well-appearing, NAD NEURO:  Alert and oriented x 3, left facial droop is mild, left arm and leg weakness is mild, left arm and leg decreased sensation is also mild EYES:  eyes equal and reactive ENT/NECK:  no LAD, no JVD CARDIO: Regular rate, well-perfused, normal S1 and S2 PULM:  CTAB no wheezing or rhonchi GI/GU:  normal bowel sounds, non-distended, non-tender MSK/SPINE:  No gross deformities, no edema SKIN:  no rash, atraumatic PSYCH:  Appropriate speech and behavior  *Additional and/or pertinent findings included in MDM below  Diagnostic and Interventional Summary    EKG Interpretation  Date/Time:  Monday May 19 2021 19:44:49 EDT Ventricular Rate:  103 PR Interval:  152 QRS Duration: 82 QT Interval:  340 QTC Calculation: 445 R Axis:   28 Text Interpretation: Sinus tachycardia Minimal voltage criteria for LVH, may be normal variant ( R in aVL ) Borderline ECG Confirmed by Gerlene Fee [phone]) on 05/20/2021 4:11:55 AM        Labs Reviewed  CBC - Abnormal; Notable for the following components:      Result Value   RBC 3.76 (*)    Hemoglobin 11.0 (*)    HCT 32.1 (*)    All  other components within normal limits  DIFFERENTIAL - Abnormal; Notable for the following components:   Eosinophils Absolute 0.8 (*)    All other components within normal limits  COMPREHENSIVE METABOLIC PANEL - Abnormal; Notable for the following components:   Sodium 132 (*)    CO2 20 (*)    Glucose, Bld 174 (*)    BUN 44 (*)    Creatinine, Ser 4.60 (*)    Albumin 3.4 (*)    GFR, Estimated 15 (*)    All other components within normal limits  URINALYSIS, ROUTINE W REFLEX MICROSCOPIC - Abnormal; Notable for the following components:   APPearance HAZY (*)    Glucose, UA 50 (*)    Protein, ur >=300 (*)    All other components within normal limits  CBG MONITORING, ED - Abnormal; Notable for the following components:   Glucose-Capillary 177 (*)    All other  components within normal limits  I-STAT CHEM 8, ED - Abnormal; Notable for the following components:   BUN 43 (*)    Creatinine, Ser 5.00 (*)    Glucose, Bld 173 (*)    TCO2 20 (*)    Hemoglobin 10.9 (*)    HCT 32.0 (*)    All other components within normal limits  ETHANOL  PROTIME-INR  APTT  RAPID URINE DRUG SCREEN, HOSP PERFORMED    MR BRAIN WO CONTRAST  Final Result    MR ANGIO HEAD WO CONTRAST  Final Result      Medications  sodium chloride 0.9 % bolus 1,000 mL (has no administration in time range)  aspirin chewable tablet 324 mg (has no administration in time range)  LORazepam (ATIVAN) tablet 1 mg (1 mg Oral Given 05/19/21 2208)     Procedures  /  Critical Care .Critical Care  Date/Time: 05/20/2021 4:57 AM Performed by: Maudie Flakes, MD Authorized by: Maudie Flakes, MD   Critical care provider statement:    Critical care time (minutes):  32   Critical care was necessary to treat or prevent imminent or life-threatening deterioration of the following conditions: Acute ischemic stroke.   Critical care was time spent personally by me on the following activities:  Discussions with consultants, evaluation of patient's response to treatment, examination of patient, ordering and performing treatments and interventions, ordering and review of laboratory studies, ordering and review of radiographic studies, pulse oximetry, re-evaluation of patient's condition, obtaining history from patient or surrogate and review of old charts  ED Course and Medical Decision Making  I have reviewed the triage vital signs, the nursing notes, and pertinent available records from the EMR.  Listed above are laboratory and imaging tests that I personally ordered, reviewed, and interpreted and then considered in my medical decision making (see below for details).  History concerning for stroke, outside of any timetable for intervention.  Confirmed on MRI, will consult neurology and admit to  medicine.  Notably, patient also has creatinine of 5, no priors in our system, denies any history of CKD.       Barth Kirks. [place]onia Small, [place] [email or web address]  Final Clinical Impressions(s) / ED Diagnoses     ICD-10-CM   1. Acute ischemic stroke (HCC)  I63.9     2. Impaired renal function  N28.9       ED Discharge Orders     None        Discharge Instructions Discussed with and Provided to Patient:   Discharge Instructions  None       Maudie Flakes, MD 05/20/21 [phone]

## 2021-05-20 NOTE — Progress Notes (Signed)
Carotid duplex has been completed.   Preliminary results in CV Proc.   Abram Sander 05/20/2021 10:02 AM

## 2021-05-20 NOTE — Progress Notes (Signed)
  Echocardiogram 2D Echocardiogram has been performed.  Bruce Little 05/20/2021, 11:36 AM

## 2021-05-20 NOTE — Consult Note (Addendum)
[facility]  RESIDENT INPATIENT CONSULTATION  Reason for Consultation: AKI Requesting Provider: Dr Domenic Polite  HPI: Bruce Little is an 50 y.o. male with PMHx of diabetes mellitus and hypertension admitted for subacute ischemic infarction in the right caudate body noted to have acute kidney injury with serum creatinine up to 5 on admission for which nephrology consulted.  Patient reports that he was previously taking an antihypertensive previously that he was informed was also to help his kidneys but is unable to recall the name of this medication. He does note that he has not taken this "for a while". He was in his usual state of health until Sunday evening when he started having left sided numbness and weakness that persisted through Monday, prompting him to seek medical care. He denies any other symptoms; however, does note decreased PO intake over the past two days. 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 No other concerns at this time.   PMH: Past Medical History:  Diagnosis Date   DM2 (diabetes mellitus, type 2) ([place])    HTN (hypertension)     Past Medical History:  Diagnosis Date   DM2 (diabetes mellitus, type 2) ([place])    HTN (hypertension)     Medications: No current facility-administered medications on file prior to encounter.   Current Outpatient Medications on File Prior to Encounter  Medication Sig Dispense Refill   albuterol (VENTOLIN HFA) 108 (90 Base) MCG/ACT inhaler Inhale 1-2 puffs into the lungs every 6 (six) hours as needed for wheezing or shortness of breath.     aspirin EC 81 MG tablet Take 81 mg by mouth daily. Swallow whole.     glyBURIDE-metformin (GLUCOVANCE) 5-500 MG tablet Take 2 tablets by mouth 2 (two) times daily.     hydrOXYzine (ATARAX/VISTARIL) 25 MG tablet Take 12.5-25 mg by mouth 3 (three) times daily as needed for  anxiety.     losartan (COZAAR) 100 MG tablet Take 100 mg by mouth daily.     multivitamin (ONE-A-DAY MEN'S) TABS tablet Take 1 tablet by mouth daily.     omeprazole (PRILOSEC OTC) 20 MG tablet Take 20 mg by mouth daily.     VICTOZA 18 MG/3ML SOPN Inject 1.8 mg into the skin daily.     zolpidem (AMBIEN) 10 MG tablet Take 5-10 mg by mouth at bedtime as needed for sleep.      ALLERGIES:  No Known Allergies  FAM HX: Family History  Problem Relation Age of Onset   Stroke Mother   Mother - renal cancer  Father - heart disease, hypertension  Grandfather - colon cancer  Social History:  No history of tobacco use. Occasional alcohol use. No illicit drug use.   ROS: Negative except as stated in HPI.   Blood pressure (!) 159/77, pulse 96, temperature 98.1 F (36.7 C), temperature source Oral, resp. rate (!) 9, height '6\' 2"'$  (1.88 m), weight 113.4 kg, SpO2 99 %. PHYSICAL EXAM: Gen: middle aged male, resting comfortably in bed, no acute distress  Eyes: EOMI, anicteric sclerae, PERRL ENT: MMM, normal dentition, posterior pharynx without exudate or lesions Neck: supple, no thyromegaly CV: RRR, S1 and S2 present, no m/r/g Abd: Distended, soft, nontender, +BS Extr: no peripheral edema noted; distal pulses present  Neuro: Aox3, CNII-XII intact, strength 5/5 in all extremities Skin:no rashes or lesions noted    Results for orders placed or  performed during the hospital encounter of 05/19/21 (from the past 48 hour(s))  CBG monitoring, ED     Status: Abnormal   Collection Time: 05/19/21  7:49 PM  Result Value Ref Range   Glucose-Capillary 177 (H) 70 - 99 mg/dL    Comment: Glucose reference range applies only to samples taken after fasting for at least 8 hours.  Ethanol     Status: None   Collection Time: 05/19/21  7:50 PM  Result Value Ref Range   Alcohol, Ethyl (B) <10 <10 mg/dL    Comment: (NOTE) Lowest detectable limit for serum alcohol is 10 mg/dL.  For medical purposes  only. Performed at [facility], [place] [address] [place], [place] [address]   Protime-INR     Status: None   Collection Time: 05/19/21  7:50 PM  Result Value Ref Range   Prothrombin Time 11.9 11.4 - 15.2 seconds   INR 0.9 0.8 - 1.2    Comment: (NOTE) INR goal varies based on device and disease states. Performed at [facility], [place] [address], [place], [place] [address]   APTT     Status: None   Collection Time: 05/19/21  7:50 PM  Result Value Ref Range   aPTT 25 24 - 36 seconds    Comment: Performed at [facility] [address] [place], [place] [address]  CBC     Status: Abnormal   Collection Time: 05/19/21  7:50 PM  Result Value Ref Range   WBC 9.7 4.0 - 10.5 K/uL   RBC 3.76 (L) 4.22 - 5.81 MIL/uL   Hemoglobin 11.0 (L) 13.0 - 17.0 g/dL   HCT 32.1 (L) 39.0 - 52.0 %   MCV 85.4 80.0 - 100.0 fL   MCH 29.3 26.0 - 34.0 pg   MCHC 34.3 30.0 - 36.0 g/dL   RDW 12.4 11.5 - 15.5 %   Platelets 280 150 - 400 K/uL   nRBC 0.0 0.0 - 0.2 %    Comment: Performed at [facility], [place] [address], [place], [place] [address]  Differential     Status: Abnormal   Collection Time: 05/19/21  7:50 PM  Result Value Ref Range   Neutrophils Relative % 63 %   Neutro Abs 6.1 1.7 - 7.7 K/uL   Lymphocytes Relative 20 %   Lymphs Abs 2.0 0.7 - 4.0 K/uL   Monocytes Relative 7 %   Monocytes Absolute 0.7 0.1 - 1.0 K/uL   Eosinophils Relative 8 %   Eosinophils Absolute 0.8 (H) 0.0 - 0.5 K/uL   Basophils Relative 1 %   Basophils Absolute 0.1 0.0 - 0.1 K/uL   Immature Granulocytes 1 %   Abs Immature Granulocytes 0.06 0.00 - 0.07 K/uL    Comment: Performed at [facility] [address], [place], [place] [address]  Comprehensive metabolic panel     Status: Abnormal   Collection Time: 05/19/21  7:50 PM  Result Value Ref Range   Sodium 132 (L) 135 - 145 mmol/L   Potassium 4.6 3.5 - 5.1 mmol/L   Chloride 103 98 - 111 mmol/L   CO2 20 (L) 22 - 32 mmol/L   Glucose, Bld 174  (H) 70 - 99 mg/dL    Comment: Glucose reference range applies only to samples taken after fasting for at least 8 hours.   BUN 44 (H) 6 - 20 mg/dL   Creatinine, Ser 4.60 (H) 0.61 - 1.24 mg/dL   Calcium 8.9 8.9 -  10.3 mg/dL   Total Protein 6.9 6.5 - 8.1 g/dL   Albumin 3.4 (L) 3.5 - 5.0 g/dL   AST 19 15 - 41 U/L   ALT 20 0 - 44 U/L   Alkaline Phosphatase 94 38 - 126 U/L   Total Bilirubin 0.6 0.3 - 1.2 mg/dL   GFR, Estimated 15 (L) >60 mL/min    Comment: (NOTE) Calculated using the CKD-EPI Creatinine Equation (2021)    Anion gap 9 5 - 15    Comment: Performed at [facility] [address] [place], [place] [address]  Urine rapid drug screen (hosp performed)     Status: None   Collection Time: 05/19/21  7:50 PM  Result Value Ref Range   Opiates NONE DETECTED NONE DETECTED   Cocaine NONE DETECTED NONE DETECTED   Benzodiazepines NONE DETECTED NONE DETECTED   Amphetamines NONE DETECTED NONE DETECTED   Tetrahydrocannabinol NONE DETECTED NONE DETECTED   Barbiturates NONE DETECTED NONE DETECTED    Comment: (NOTE) DRUG SCREEN FOR MEDICAL PURPOSES ONLY.  IF CONFIRMATION IS NEEDED FOR ANY PURPOSE, NOTIFY LAB WITHIN 5 DAYS.  LOWEST DETECTABLE LIMITS FOR URINE DRUG SCREEN Drug Class                     Cutoff (ng/mL) Amphetamine and metabolites    1000 Barbiturate and metabolites    200 Benzodiazepine                 [id number] Tricyclics and metabolites     300 Opiates and metabolites        300 Cocaine and metabolites        300 THC                            50 Performed at [facility], [place] [address] [place], [place] [address]   Urinalysis, Routine w reflex microscopic Urine, Clean Catch     Status: Abnormal   Collection Time: 05/19/21  7:50 PM  Result Value Ref Range   Color, Urine YELLOW YELLOW   APPearance HAZY (A) CLEAR   Specific Gravity, Urine 1.018 1.005 - 1.030   pH 5.0 5.0 - 8.0   Glucose, UA 50 (A) NEGATIVE mg/dL   Hgb urine dipstick NEGATIVE NEGATIVE    Bilirubin Urine NEGATIVE NEGATIVE   Ketones, ur NEGATIVE NEGATIVE mg/dL   Protein, ur >=300 (A) NEGATIVE mg/dL   Nitrite NEGATIVE NEGATIVE   Leukocytes,Ua NEGATIVE NEGATIVE   RBC / HPF 0-5 0 - 5 RBC/hpf   WBC, UA 0-5 0 - 5 WBC/hpf   Bacteria, UA NONE SEEN NONE SEEN   Squamous Epithelial / LPF 0-5 0 - 5    Comment: Performed at [facility], [place] [address], [place], [place] [address]  I-stat chem 8, ED     Status: Abnormal   Collection Time: 05/19/21  8:44 PM  Result Value Ref Range   Sodium 136 135 - 145 mmol/L   Potassium 4.7 3.5 - 5.1 mmol/L   Chloride 106 98 - 111 mmol/L   BUN 43 (H) 6 - 20 mg/dL   Creatinine, Ser 5.00 (H) 0.61 - 1.24 mg/dL   Glucose, Bld 173 (H) 70 - 99 mg/dL    Comment: Glucose reference range applies only to samples taken after fasting for at least 8 hours.   Calcium, Ion 1.22 1.15 - 1.40 mmol/L   TCO2 20 (L) 22 - 32 mmol/L  Hemoglobin 10.9 (L) 13.0 - 17.0 g/dL   HCT 32.0 (L) 39.0 - 52.0 %  Hemoglobin A1c     Status: Abnormal   Collection Time: 05/20/21  5:27 AM  Result Value Ref Range   Hgb A1c MFr Bld 7.4 (H) 4.8 - 5.6 %    Comment: (NOTE) Pre diabetes:          5.7%-6.4%  Diabetes:              >6.4%  Glycemic control for   <7.0% adults with diabetes    Mean Plasma Glucose 165.68 mg/dL    Comment: Performed at [facility] [address], [place], [place] [address]  CBG monitoring, ED     Status: Abnormal   Collection Time: 05/20/21  7:41 AM  Result Value Ref Range   Glucose-Capillary 180 (H) 70 - 99 mg/dL    Comment: Glucose reference range applies only to samples taken after fasting for at least 8 hours.  CBG monitoring, ED     Status: Abnormal   Collection Time: 05/20/21 11:54 AM  Result Value Ref Range   Glucose-Capillary 221 (H) 70 - 99 mg/dL    Comment: Glucose reference range applies only to samples taken after fasting for at least 8 hours.   Comment 1 Notify RN    Comment 2 Document in Chart     MR ANGIO HEAD WO  CONTRAST  Result Date: 05/20/2021 CLINICAL DATA:  Left facial droop EXAM: MRI HEAD WITHOUT CONTRAST MRA HEAD WITHOUT CONTRAST TECHNIQUE: Multiplanar, multi-echo pulse sequences of the brain and surrounding structures were acquired without intravenous contrast. Angiographic images of the Circle of Willis were acquired using MRA technique without intravenous contrast. COMPARISON:  No pertinent prior exam. FINDINGS: MRI HEAD FINDINGS Brain: Small acute/early subacute infarct the right caudate body. 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 There is multifocal hyperintense T2-weighted signal within the white matter. Parenchymal volume and CSF spaces are normal. 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 The midline structures are normal. Vascular: Major flow voids are preserved. Skull and upper cervical spine: Normal calvarium and skull base. Visualized upper cervical spine and soft tissues are normal. Sinuses/Orbits:No paranasal sinus fluid levels or advanced mucosal thickening. No mastoid or middle ear effusion. Normal orbits. MRA HEAD FINDINGS POSTERIOR CIRCULATION: --Vertebral arteries: Normal --Inferior cerebellar arteries: Normal. --Basilar artery: Normal. --Superior cerebellar arteries: Normal. --Posterior cerebral arteries: Mild stenosis of the proximal left P2 segment. Normal right PCA. ANTERIOR CIRCULATION: --Intracranial internal carotid arteries: Normal. --Anterior cerebral arteries (ACA): Normal. --Middle cerebral arteries (MCA): Normal. ANATOMIC VARIANTS: None IMPRESSION: 1. Small acute/early subacute infarct of the right caudate body. No hemorrhage or mass effect. 2. 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 3. Mild stenosis of the proximal left P2 segment. Electronically Signed   By: Ulyses Jarred M.D.   On: 05/20/2021 00:08   MR BRAIN WO CONTRAST  Result Date: 05/20/2021 CLINICAL DATA:  Left facial droop EXAM: MRI HEAD WITHOUT CONTRAST MRA  HEAD WITHOUT CONTRAST TECHNIQUE: Multiplanar, multi-echo pulse sequences of the brain and surrounding structures were acquired without intravenous contrast. Angiographic images of the Circle of Willis were acquired using MRA technique without intravenous contrast. COMPARISON:  No pertinent prior exam. FINDINGS: MRI HEAD FINDINGS Brain: Small acute/early subacute infarct the right caudate body. 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 There is multifocal hyperintense T2-weighted signal within the white matter. Parenchymal volume and CSF spaces  are normal. 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 The midline structures are normal. Vascular: Major flow voids are preserved. Skull and upper cervical spine: Normal calvarium and skull base. Visualized upper cervical spine and soft tissues are normal. Sinuses/Orbits:No paranasal sinus fluid levels or advanced mucosal thickening. No mastoid or middle ear effusion. Normal orbits. MRA HEAD FINDINGS POSTERIOR CIRCULATION: --Vertebral arteries: Normal --Inferior cerebellar arteries: Normal. --Basilar artery: Normal. --Superior cerebellar arteries: Normal. --Posterior cerebral arteries: Mild stenosis of the proximal left P2 segment. Normal right PCA. ANTERIOR CIRCULATION: --Intracranial internal carotid arteries: Normal. --Anterior cerebral arteries (ACA): Normal. --Middle cerebral arteries (MCA): Normal. ANATOMIC VARIANTS: None IMPRESSION: 1. Small acute/early subacute infarct of the right caudate body. No hemorrhage or mass effect. 2. 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 3. Mild stenosis of the proximal left P2 segment. Electronically Signed   By: Ulyses Jarred M.D.   On: 05/20/2021 00:08   US RENAL  Result Date: 05/20/2021 CLINICAL DATA:  50 year old male with renal insufficiency. Diabetes and hypertension. EXAM: RENAL / URINARY TRACT ULTRASOUND COMPLETE COMPARISON:  None. FINDINGS: Right Kidney: Renal  measurements: 13.4 x 5.3 x 5.4 cm = volume: 202 mL. Echogenicity within normal limits. No mass or hydronephrosis visualized. Left Kidney: Renal measurements: 13.5 x 7.5 x 7.1 cm = volume: 375 mL. Echogenicity within normal limits. No mass or hydronephrosis visualized. Bladder: Appears normal for degree of bladder distention. Both ureteral jets detected with Doppler. Other: Echogenic liver (image 2). IMPRESSION: 1. Normal ultrasound appearance of both kidneys and the urinary bladder. 2. Hepatic steatosis. Electronically Signed   By: Genevie Ann M.D.   On: 05/20/2021 06:45   ECHOCARDIOGRAM COMPLETE  Result Date: 05/20/2021    ECHOCARDIOGRAM REPORT   Patient Name:   Bruce Little Date of Exam: 05/20/2021 Medical Rec #:  [id number]    Height:       74.0 in Accession #:    [id number]   Weight:       250.0 lb Date of Birth:  1971-07-24    BSA:          2.390 m Patient Age:    82 years     BP:           183/119 mmHg Patient Gender: M            HR:           94 bpm. Exam Location:  Inpatient Procedure: 2D Echo, Color Doppler, Cardiac Doppler and Intracardiac            Opacification Agent Indications:    Stroke I63.9  History:        Patient has no prior history of Echocardiogram examinations.                 Risk Factors:Diabetes and Hypertension.  Sonographer:    Bernadene Person RDCS Referring Phys: [place]  1. Left ventricular ejection fraction, by estimation, is 60 to 65%. The left ventricle has normal function. The left ventricle has no regional wall motion abnormalities. There is mild concentric left ventricular hypertrophy. Left ventricular diastolic function could not be evaluated.  2. Right ventricular systolic function is normal. The right ventricular size is normal. Tricuspid regurgitation signal is inadequate for assessing PA pressure.  3. Left atrial size was mildly dilated.  4. The mitral valve is normal in structure. Trivial mitral valve regurgitation. No evidence of mitral stenosis.  5.  The aortic valve is normal in structure. Aortic  valve regurgitation is not visualized. No aortic stenosis is present.  6. The inferior vena cava is normal in size with <50% respiratory variability, suggesting right atrial pressure of 8 mmHg. FINDINGS  Left Ventricle: Left ventricular ejection fraction, by estimation, is 60 to 65%. The left ventricle has normal function. The left ventricle has no regional wall motion abnormalities. Definity contrast agent was given IV to delineate the left ventricular  endocardial borders. The left ventricular internal cavity size was normal in size. There is mild concentric left ventricular hypertrophy. Left ventricular diastolic function could not be evaluated. Right Ventricle: The right ventricular size is normal. No increase in right ventricular wall thickness. Right ventricular systolic function is normal. Tricuspid regurgitation signal is inadequate for assessing PA pressure. Left Atrium: Left atrial size was mildly dilated. Right Atrium: Right atrial size was normal in size. Pericardium: There is no evidence of pericardial effusion. Mitral Valve: The mitral valve is normal in structure. Trivial mitral valve regurgitation. No evidence of mitral valve stenosis. Tricuspid Valve: The tricuspid valve is normal in structure. Tricuspid valve regurgitation is trivial. No evidence of tricuspid stenosis. Aortic Valve: The aortic valve is normal in structure. Aortic valve regurgitation is not visualized. No aortic stenosis is present. Pulmonic Valve: The pulmonic valve was normal in structure. Pulmonic valve regurgitation is not visualized. No evidence of pulmonic stenosis. Aorta: The aortic root is normal in size and structure. Venous: The inferior vena cava is normal in size with less than 50% respiratory variability, suggesting right atrial pressure of 8 mmHg. IAS/Shunts: No atrial level shunt detected by color flow Doppler.  LEFT VENTRICLE PLAX 2D LVIDd:         5.30 cm  Diastology  LVIDs:         3.90 cm  LV e' lateral: 8.76 cm/s LV PW:         1.20 cm LV IVS:        1.20 cm LVOT diam:     2.30 cm LV SV:         80 LV SV Index:   34 LVOT Area:     4.15 cm  RIGHT VENTRICLE RV S prime:     10.40 cm/s TAPSE (M-mode): 2.1 cm LEFT ATRIUM             Index       RIGHT ATRIUM           Index LA diam:        4.70 cm 1.97 cm/m  RA Area:     13.00 cm LA Vol (A2C):   85.5 ml 35.78 ml/m RA Volume:   26.10 ml  10.92 ml/m LA Vol (A4C):   89.6 ml 37.49 ml/m LA Biplane Vol: 93.3 ml 39.04 ml/m  AORTIC VALVE LVOT Vmax:   95.60 cm/s LVOT Vmean:  67.000 cm/s LVOT VTI:    0.193 m  AORTA Ao Root diam: 3.40 cm Ao Asc diam:  3.50 cm  SHUNTS Systemic VTI:  0.19 m Systemic Diam: 2.30 cm Fransico Him MD Electronically signed by Fransico Him MD Signature Date/Time: 05/20/2021/11:42:26 AM    Final    VAS US CAROTID (at [facility] and [facility] only)  Result Date: 05/20/2021 Carotid Arterial Duplex Study Patient Name:  Bruce Little  Date of Exam:   05/20/2021 Medical Rec #: [id number]     Accession #:    [id number] Date of Birth: 1971/01/02     Patient Gender: M Patient Age:   17Y Exam Location:  [facility] Procedure:      VAS US CAROTID Referring Phys: [id number] JARED M GARDNER --------------------------------------------------------------------------------  Indications:       CVA. Risk Factors:      Hyperlipidemia, Diabetes. Comparison Study:  no prior Performing Technologist: Archie Patten RVS  Examination Guidelines: A complete evaluation includes B-mode imaging, spectral Doppler, color Doppler, and power Doppler as needed of all accessible portions of each vessel. Bilateral testing is considered an integral part of a complete examination. Limited examinations for reoccurring indications may be performed as noted.  Right Carotid Findings: +----------+--------+--------+--------+------------------+--------+           PSV cm/sEDV cm/sStenosisPlaque DescriptionComments  +----------+--------+--------+--------+------------------+--------+ CCA Prox  54      10              heterogenous               +----------+--------+--------+--------+------------------+--------+ CCA Distal71      16              heterogenous               +----------+--------+--------+--------+------------------+--------+ ICA Prox  126     51      40-59%  heterogenous               +----------+--------+--------+--------+------------------+--------+ ICA Mid   108     42                                         +----------+--------+--------+--------+------------------+--------+ ICA Distal90      35                                         +----------+--------+--------+--------+------------------+--------+ ECA       151                                                +----------+--------+--------+--------+------------------+--------+ +----------+--------+-------+--------+-------------------+           PSV cm/sEDV cmsDescribeArm Pressure (mmHG) +----------+--------+-------+--------+-------------------+ [id number]                                         +----------+--------+-------+--------+-------------------+ +---------+--------+--+--------+--+---------+ VertebralPSV cm/s38EDV cm/s10Antegrade +---------+--------+--+--------+--+---------+  Left Carotid Findings: +----------+--------+--------+--------+------------------+--------+           PSV cm/sEDV cm/sStenosisPlaque DescriptionComments +----------+--------+--------+--------+------------------+--------+ CCA Prox  74      10              heterogenous               +----------+--------+--------+--------+------------------+--------+ CCA Distal50      12              heterogenous               +----------+--------+--------+--------+------------------+--------+ ICA Prox  70      22      1-39%   heterogenous                +----------+--------+--------+--------+------------------+--------+ ICA Distal81      29                                         +----------+--------+--------+--------+------------------+--------+  ECA       113     8                                          +----------+--------+--------+--------+------------------+--------+ +----------+--------+--------+--------+-------------------+           PSV cm/sEDV cm/sDescribeArm Pressure (mmHG) +----------+--------+--------+--------+-------------------+ [id number]                                          +----------+--------+--------+--------+-------------------+ +---------+--------+--+--------+--+---------+ VertebralPSV cm/s29EDV cm/s10Antegrade +---------+--------+--+--------+--+---------+   Summary: Right Carotid: Velocities in the right ICA are consistent with a 40-59%                stenosis. Left Carotid: Velocities in the left ICA are consistent with a 1-39% stenosis. Vertebrals: Bilateral vertebral arteries demonstrate antegrade flow. *See table(s) above for measurements and observations.     Preliminary     Assessment/Plan Mr Bruce Little is a 50 year old male with PMHx of diabetes mellitus and hypertension admitted for subacute ischemic infarct in R caudate body and renal insufficiency.   Elevated sCr, presumably AKI: Patient presented with BUN/sCr 43/5.0. Unknown baseline; however, patient does not recall any history of CKD. Renal US without any acute findings noted. UA without evidence of infection but does show isolated proteinuria (see below). Obtaining further urine studies; however, suspect that this is pre-renal in setting of decreased oral intake over the past two days due to travel. Will start fluid resuscitation and trend renal function.  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 He does have a history of diabetes and hypertension which could be contributing; however,  notes that his chronic illnesses are generally well controlled. No history of abnormal rashes. Will order further work up including ANA, C3/C4 complement, HIV, RPR, Hep B/C labs, and anti-PLA2R antibodies, SPEP/UPEP/IFE.  Acute ischemic stroke - symptoms are resolved at this time. Currently on DAPT. Stroke work up per neurology and primary team.  Hypertension - currently allowing for permissive hypertension given CVA.  Avoid resumption of ARB in setting of AKI.  Diabetes: HbA1c 7.4. Continued on Victoza. Metformin held in setting of AKI as noted above. On SSI.    Harvie Heck, MD Internal Medicine, PGY-3 05/20/21 11:56 AM Pager # [phone]  I personally saw and evaluated the patient and agree with the assessment and plan by Dr. Wilfred Curtis well controlled uncomplicated DM, HTN who presents with subacute CVA found to have Cr 5.  Has nephrotic range proteinuria on UP/C, serum albumin 3.4 but no edema.  Renal US unremarkable.  We will see how his renal function trends with volume expansion given the report of decreased PO intake a few days prior.  If it doesn't improve quickly I favor a renal biopsy.  Serologic eval underway for the proteinuria.  A renal biopsy may be challenging with his dual antiplatelet therapy.     Jannifer Hick MD [facility] Pager [phone]

## 2021-05-20 NOTE — Progress Notes (Signed)
Patient seen and examined, admitted earlier this morning by Dr. 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 in the ER blood pressure was in the 200s, MRI was positive for acute ischemic infarct, creatinine was noted to be 5.  Acute ischemic stroke -Symptoms improving -Neurology consulting -Follow-up carotid duplex and echo -Received aspirin 325 mg yesterday, neuro recommended to add Plavix -Hemoglobin A1c 7.4, LDL is pending  Renal failure -Chronicity unknown -Patient does not recall knowing about abnormal kidney function in the past -Will request records from PCPs office at [facility] in [place] -Losartan held, history of intermittent ibuprofen use -Urinalysis with 300+ proteinuria, suggesting diabetic nephropathy -Will request nephrology input  Type 2 diabetes mellitus -Glyburide, metformin on hold -Continue sensitive sliding scale  Hypertension -Permissive hypertension in the setting of acute stroke   Domenic Polite, MD

## 2021-05-20 NOTE — ED Notes (Signed)
Messaged Dr. Erlinda Hong in regards to patient's BP 190/110. Still allowing for permissive HTN at this time. No new orders at this time.

## 2021-05-21 ENCOUNTER — Inpatient Hospital Stay (HOSPITAL_COMMUNITY): Payer: BC Managed Care – PPO

## 2021-05-21 ENCOUNTER — Other Ambulatory Visit: Payer: Self-pay | Admitting: Neurology

## 2021-05-21 DIAGNOSIS — I160 Hypertensive urgency: Secondary | ICD-10-CM

## 2021-05-21 DIAGNOSIS — I639 Cerebral infarction, unspecified: Secondary | ICD-10-CM

## 2021-05-21 LAB — CBC
HCT: 28.8 % — ABNORMAL LOW (ref 39.0–52.0)
Hemoglobin: 10 g/dL — ABNORMAL LOW (ref 13.0–17.0)
MCH: 29.1 pg (ref 26.0–34.0)
MCHC: 34.7 g/dL (ref 30.0–36.0)
MCV: 83.7 fL (ref 80.0–100.0)
Platelets: 222 10*3/uL (ref 150–400)
RBC: 3.44 MIL/uL — ABNORMAL LOW (ref 4.22–5.81)
RDW: 12.3 % (ref 11.5–15.5)
WBC: 8.9 10*3/uL (ref 4.0–10.5)
nRBC: 0 % (ref 0.0–0.2)

## 2021-05-21 LAB — HEPATITIS B SURFACE ANTIGEN: Hepatitis B Surface Ag: NONREACTIVE

## 2021-05-21 LAB — C4 COMPLEMENT: Complement C4, Body Fluid: 20 mg/dL (ref 12–38)

## 2021-05-21 LAB — SARS CORONAVIRUS 2 (TAT 6-24 HRS): SARS Coronavirus 2: NEGATIVE

## 2021-05-21 LAB — RPR: RPR Ser Ql: NONREACTIVE

## 2021-05-21 LAB — RENAL FUNCTION PANEL
Albumin: 2.9 g/dL — ABNORMAL LOW (ref 3.5–5.0)
Anion gap: 10 (ref 5–15)
BUN: 45 mg/dL — ABNORMAL HIGH (ref 6–20)
CO2: 21 mmol/L — ABNORMAL LOW (ref 22–32)
Calcium: 8.9 mg/dL (ref 8.9–10.3)
Chloride: 104 mmol/L (ref 98–111)
Creatinine, Ser: 4.4 mg/dL — ABNORMAL HIGH (ref 0.61–1.24)
GFR, Estimated: 16 mL/min — ABNORMAL LOW (ref >60)
Glucose, Bld: 208 mg/dL — ABNORMAL HIGH (ref 70–99)
Phosphorus: 4.5 mg/dL (ref 2.5–4.6)
Potassium: 4.5 mmol/L (ref 3.5–5.1)
Sodium: 135 mmol/L (ref 135–145)

## 2021-05-21 LAB — HIV ANTIBODY (ROUTINE TESTING W REFLEX): HIV Screen 4th Generation wRfx: NONREACTIVE

## 2021-05-21 LAB — LIPID PANEL
Cholesterol: 271 mg/dL — ABNORMAL HIGH (ref 0–200)
HDL: 31 mg/dL — ABNORMAL LOW (ref >40)
LDL Cholesterol: UNDETERMINED mg/dL (ref 0–99)
Total CHOL/HDL Ratio: 8.7 RATIO
Triglycerides: 456 mg/dL — ABNORMAL HIGH (ref <150)
VLDL: UNDETERMINED mg/dL (ref 0–40)

## 2021-05-21 LAB — GLUCOSE, CAPILLARY
Glucose-Capillary: 160 mg/dL — ABNORMAL HIGH (ref 70–99)
Glucose-Capillary: 200 mg/dL — ABNORMAL HIGH (ref 70–99)

## 2021-05-21 LAB — UREA NITROGEN, URINE: Urea Nitrogen, Ur: 368 mg/dL

## 2021-05-21 LAB — LDL CHOLESTEROL, DIRECT: Direct LDL: 122.9 mg/dL — ABNORMAL HIGH (ref 0–99)

## 2021-05-21 LAB — C3 COMPLEMENT: C3 Complement: 103 mg/dL (ref 82–167)

## 2021-05-21 LAB — ANA W/REFLEX IF POSITIVE: Anti Nuclear Antibody (ANA): NEGATIVE

## 2021-05-21 MED ORDER — PANTOPRAZOLE SODIUM 40 MG PO TBEC: 40.0000 mg | DELAYED_RELEASE_TABLET | Freq: Every day | ORAL | 0 refills | Status: AC

## 2021-05-21 MED ORDER — CLOPIDOGREL BISULFATE 75 MG PO TABS: 75.0000 mg | ORAL_TABLET | Freq: Every day | ORAL | 0 refills | Status: DC

## 2021-05-21 MED ORDER — AMLODIPINE BESYLATE 5 MG PO TABS
5.0000 mg | ORAL_TABLET | Freq: Every day | ORAL | Status: DC
  Administered 2021-05-21: 5 mg via ORAL
  Filled 2021-05-21: qty 1

## 2021-05-21 MED ORDER — HYDRALAZINE HCL 20 MG/ML IJ SOLN
10.0000 mg | Freq: Once | INTRAMUSCULAR | Status: AC
  Administered 2021-05-21: 10 mg via INTRAVENOUS
  Filled 2021-05-21: qty 1

## 2021-05-21 MED ORDER — CLOPIDOGREL BISULFATE 75 MG PO TABS: 75.0000 mg | ORAL_TABLET | Freq: Every day | ORAL | Status: DC

## 2021-05-21 MED ORDER — ATORVASTATIN CALCIUM 80 MG PO TABS: 80.0000 mg | ORAL_TABLET | Freq: Every day | ORAL | 0 refills | Status: DC

## 2021-05-21 MED ORDER — FENOFIBRATE 160 MG PO TABS: 160.0000 mg | ORAL_TABLET | Freq: Every day | ORAL | 0 refills | Status: DC

## 2021-05-21 MED ORDER — AMLODIPINE BESYLATE 10 MG PO TABS: 10.0000 mg | ORAL_TABLET | Freq: Every day | ORAL | 0 refills | Status: DC

## 2021-05-21 MED ORDER — ACETAMINOPHEN 325 MG PO TABS: 650.0000 mg | ORAL_TABLET | ORAL | 0 refills | Status: DC | PRN

## 2021-05-21 MED ORDER — GLIPIZIDE 5 MG PO TABS: 5.0000 mg | ORAL_TABLET | Freq: Every day | ORAL | 0 refills | Status: DC

## 2021-05-21 MED ORDER — FENOFIBRATE 160 MG PO TABS
160.0000 mg | ORAL_TABLET | Freq: Every day | ORAL | Status: DC
  Administered 2021-05-21: 160 mg via ORAL
  Filled 2021-05-21: qty 1

## 2021-05-21 MED ORDER — ATORVASTATIN CALCIUM 80 MG PO TABS
80.0000 mg | ORAL_TABLET | Freq: Every day | ORAL | Status: DC
  Administered 2021-05-21: 80 mg via ORAL
  Filled 2021-05-21: qty 1

## 2021-05-21 NOTE — Discharge Summary (Signed)
Physician Discharge Summary  Bruce Little [id number] DOB: 04-13-1971 DOA: 05/19/2021  PCP: [facility] [place] date: 05/19/2021 Discharge date: 05/21/2021  Admitted From: Home Disposition: Home  Recommendations for Outpatient Follow-up:  Follow up with PCP in 1-2 weeks Follow up with Nephrology as an outpatient for Renal Bx Follow up with Neurology in 4 weeks  Follow up with Vascular Surgery as an outpatient for Right ICA Stenosis  Please obtain BMP/CBC in one week Please follow up on the following pending results: ANA, C3/C4 complement wnl, HIV negative. Hepatitis labs pending. Anti-PLA2R Ab and SPEP pending.  Home Health: No Equipment/Devices: None  Discharge Condition: Stable CODE STATUS: FULL CODE Diet recommendation: Heart Healthy Carb Modified Diet  Brief/Interim Summary: The patient is a 50 year old obese Caucasian male with a past medical history significant for but not limited to diabetes mellitus type 2 and hypertension who woke up 5 AM Monday morning with left-sided numbness and weakness.  Symptoms were mild and he just decided to monitor symptoms and go over the day but since his symptoms persisted and he began to develop intermittent fatigue he presented to the ED for further evaluation.  Is noted to have an ischemic stroke on MRI and blood pressure was elevated.  Also had a creatinine of 5 with no real past medical history of renal issues but he was on metformin which has been held.  Nephrology and neurology have both been consulted and nephrology recommending outpatient renal biopsy and give the patient a fluid hydration challenge with improved his creatinine mildly.  Neurology signed off the case and recommended dual antiplatelet therapy given that they felt that he has small vessel disease.  He has been deemed stable to be discharged only to follow-up with PCP, vascular surgery, nephrology, neurology in the outpatient setting and IR is going to schedule him  renal biopsy on the outpatient setting for the patient.  Discharge Diagnoses:  Principal Problem:   Acute ischemic stroke [facility]) Active Problems:   Renal insufficiency   DM2 (diabetes mellitus, type 2) (HCC)   HTN (hypertension)  Acute ischemic stroke -Symptoms improving -MRI Brain and MRA head done and showed "Small acute/early subacute infarct of the right caudate body. No hemorrhage or mass effect. 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 Mild stenosis of the proximal left P2 segment." -Neurology consulting -Follow-up carotid duplex and echo; Carotid Duplex Showed "Right Carotid: Velocities in the right ICA are consistent with a 40-59% stenosis. Left Carotid: Velocities in the left ICA are consistent with a 1-39%  stenosis. Vertebrals: Bilateral vertebral arteries demonstrate antegrade flow.  -ECHO done and showed EF of 60-65% with a Normal Right Ventricular Systolic Fxn -Received aspirin 325 mg yesterday, neuro recommended to add Plavix; Now on ASA and Plavix x3 weeks and then just Plavix daily  -Hemoglobin A1c 7.4, LDL is unable to be calculated -Now on Atorvastatin 80 mg po Daily  -PT/OT recommending no follow up  HLD -Lipid panel done and showed total cholesterol/HDL ratio of 8.7, cholesterol of 271, HDL 31, LDL unable to be calculated however direct LDL is 122.9 and triglycerides were 456 with VLDL not be able to be calculated for -C/w Statin and Fenofibrate 160 mg po Daily   Right ICA Stenosis -Noted to be 50-59% on the Right  -Neurology recommending CTA Head and Neck and outpatient Vascular Follow up    Renal failure, Suspect AKI  Proteinuria Metabolic acidosis -Chronicity unknown -Patient does not recall knowing about abnormal kidney function in  the past -Will request records from PCPs office at [facility] in [place] -Losartan held, history of intermittent ibuprofen use -Urinalysis with 300+ proteinuria, suggesting diabetic  nephropathy -Patient does have a mild metabolic acidosis with a CO2 21, anion gap of 10, chloride level of 104 -Nephrology has been consulted and his BUN/creatinine is improved s slightly and has gone from 43/5.0 is now 45/4.40 -His Fina is 3.9% suggestive of intrinsic etiology but possible concern for glomerulonephritis in the setting of his proteinuria -Renal ultrasound showed no acute findings and vascular ultrasound renal arteries showed no stenosis -Nephrology recommending renal biopsy however patient is to be off of antiplatelet therapy for 5 days prior to procedure and they have scheduled him for outpatient.  Given his good urine output nephrology recommends outpatient follow-up and discharging home -For proteinuria he does have nephrotic range proteinuria with urine protein/creatinine ratio 4.36.  Given his history of diabetes and hypertension this could be contributing but nephrology is doing further work-up with ANA, C3-C4 complement levels which have been normal, HIV and RPR also been obtained which was negative.  Hepatitis labs are negative so far but rest are still pending given that his hepatitis B surface antigen was nonreactive.  He also has an intact PLA 2R antibody and SPEP pending   Type 2 diabetes mellitus -Glyburide, metformin on hold given renal function will discontinue at discharge and start glipizide 5 mg daily and have PCP titrate up -Continue Victoza in outpatient setting -Continue sensitive sliding scale while hospitalized -Hemoglobin A1c was 7.4 -Follow-up with PCP -CBGs ranging from 160-224   Hypertension -Permissive hypertension in the setting of acute stroke -Gradually allow blood pressures to trend back down to normal and he is started on amlodipine 5 mg daily will titrate up to 10 tomorrow -Have PCP add hydralazine if necessary but avoid ARB in setting of his elevated creatinine -Blood pressure prior to discharge was 199/127 but he was given IV hydralazine and on  repeat was improved  Normocytic anemia -Patient's hemoglobin/hematocrit is now 10.0/28.8 -In the setting of possible renal dysfunction-continue monitor for signs or symptoms of bleeding; currently no overt bleeding noted -Repeat CBC within 1 week    Obesity -Complicates overall prognosis and care -Estimated body mass index is 32.1 kg/m as calculated from the following:   Height as of this encounter: '6\' 2"'$  (1.88 m).   Weight as of this encounter: 113.4 kg. -Weight Loss and Dietary Counseling given   Discharge Instructions  Discharge Instructions     Call MD for:  difficulty breathing, headache or visual disturbances   Complete by: As directed    Call MD for:  extreme fatigue   Complete by: As directed    Call MD for:  hives   Complete by: As directed    Call MD for:  persistant dizziness or light-headedness   Complete by: As directed    Call MD for:  persistant nausea and vomiting   Complete by: As directed    Call MD for:  redness, tenderness, or signs of infection (pain, swelling, redness, odor or green/yellow discharge around incision site)   Complete by: As directed    Call MD for:  severe uncontrolled pain   Complete by: As directed    Call MD for:  temperature >100.4   Complete by: As directed    Diet - low sodium heart healthy   Complete by: As directed    Diet Carb Modified   Complete by: As directed  Discharge instructions   Complete by: As directed    You were cared for by a hospitalist during your hospital stay. If you have any questions about your discharge medications or the care you received while you were in the hospital after you are discharged, you can call the unit and ask to speak with the hospitalist on call if the hospitalist that took care of you is not available. Once you are discharged, your primary care physician will handle any further medical issues. Please note that NO REFILLS for any discharge medications will be authorized once you are  discharged, as it is imperative that you return to your primary care physician (or establish a relationship with a primary care physician if you do not have one) for your aftercare needs so that they can reassess your need for medications and monitor your lab values.  Follow up with PCP, Nephrology, Neurology, and Vascular Surgery as an outpatient. Take all medications as prescribed. If symptoms change or worsen please return to the ED for evaluation   Increase activity slowly   Complete by: As directed       Allergies as of 05/21/2021   No Known Allergies      Medication List     STOP taking these medications    glyBURIDE-metformin 5-500 MG tablet Commonly known as: GLUCOVANCE   losartan 100 MG tablet Commonly known as: COZAAR   omeprazole 20 MG tablet Commonly known as: PRILOSEC OTC Replaced by: pantoprazole 40 MG tablet       TAKE these medications    acetaminophen 325 MG tablet Commonly known as: TYLENOL Take 2 tablets (650 mg total) by mouth every 4 (four) hours as needed for mild pain (or temp > 37.5 C (99.5 F)).   albuterol 108 (90 Base) MCG/ACT inhaler Commonly known as: VENTOLIN HFA Inhale 1-2 puffs into the lungs every 6 (six) hours as needed for wheezing or shortness of breath.   amLODipine 10 MG tablet Commonly known as: NORVASC Take 1 tablet (10 mg total) by mouth daily.   aspirin EC 81 MG tablet Take 81 mg by mouth daily. Swallow whole.   atorvastatin 80 MG tablet Commonly known as: LIPITOR Take 1 tablet (80 mg total) by mouth daily. Start taking on: May 22, 2021   clopidogrel 75 MG tablet Commonly known as: PLAVIX Take 1 tablet (75 mg total) by mouth daily. Start taking on: May 22, 2021   fenofibrate 160 MG tablet Take 1 tablet (160 mg total) by mouth daily.   glipiZIDE 5 MG tablet Commonly known as: Glucotrol Take 1 tablet (5 mg total) by mouth daily.   hydrOXYzine 25 MG tablet Commonly known as: ATARAX/VISTARIL Take 12.5-25 mg by  mouth 3 (three) times daily as needed for anxiety.   multivitamin Tabs tablet Take 1 tablet by mouth daily.   pantoprazole 40 MG tablet Commonly known as: PROTONIX Take 1 tablet (40 mg total) by mouth daily. Start taking on: May 22, 2021 Replaces: omeprazole 20 MG tablet   Victoza 18 MG/3ML Sopn Generic drug: liraglutide Inject 1.8 mg into the skin daily.   zolpidem 10 MG tablet Commonly known as: AMBIEN Take 5-10 mg by mouth at bedtime as needed for sleep.        No Known Allergies  Consultations: Nephrology Neurology  Procedures/Studies: MR ANGIO HEAD WO CONTRAST  Result Date: 05/20/2021 CLINICAL DATA:  Left facial droop EXAM: MRI HEAD WITHOUT CONTRAST MRA HEAD WITHOUT CONTRAST TECHNIQUE: Multiplanar, multi-echo pulse sequences of the brain and  surrounding structures were acquired without intravenous contrast. Angiographic images of the Circle of Willis were acquired using MRA technique without intravenous contrast. COMPARISON:  No pertinent prior exam. FINDINGS: MRI HEAD FINDINGS Brain: Small acute/early subacute infarct the right caudate body. 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 There is multifocal hyperintense T2-weighted signal within the white matter. Parenchymal volume and CSF spaces are normal. 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 The midline structures are normal. Vascular: Major flow voids are preserved. Skull and upper cervical spine: Normal calvarium and skull base. Visualized upper cervical spine and soft tissues are normal. Sinuses/Orbits:No paranasal sinus fluid levels or advanced mucosal thickening. No mastoid or middle ear effusion. Normal orbits. MRA HEAD FINDINGS POSTERIOR CIRCULATION: --Vertebral arteries: Normal --Inferior cerebellar arteries: Normal. --Basilar artery: Normal. --Superior cerebellar arteries: Normal. --Posterior cerebral arteries: Mild stenosis of the proximal left P2 segment. Normal right  PCA. ANTERIOR CIRCULATION: --Intracranial internal carotid arteries: Normal. --Anterior cerebral arteries (ACA): Normal. --Middle cerebral arteries (MCA): Normal. ANATOMIC VARIANTS: None IMPRESSION: 1. Small acute/early subacute infarct of the right caudate body. No hemorrhage or mass effect. 2. 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 3. Mild stenosis of the proximal left P2 segment. Electronically Signed   By: Ulyses Jarred M.D.   On: 05/20/2021 00:08   MR BRAIN WO CONTRAST  Result Date: 05/20/2021 CLINICAL DATA:  Left facial droop EXAM: MRI HEAD WITHOUT CONTRAST MRA HEAD WITHOUT CONTRAST TECHNIQUE: Multiplanar, multi-echo pulse sequences of the brain and surrounding structures were acquired without intravenous contrast. Angiographic images of the Circle of Willis were acquired using MRA technique without intravenous contrast. COMPARISON:  No pertinent prior exam. FINDINGS: MRI HEAD FINDINGS Brain: Small acute/early subacute infarct the right caudate body. 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 There is multifocal hyperintense T2-weighted signal within the white matter. Parenchymal volume and CSF spaces are normal. 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 The midline structures are normal. Vascular: Major flow voids are preserved. Skull and upper cervical spine: Normal calvarium and skull base. Visualized upper cervical spine and soft tissues are normal. Sinuses/Orbits:No paranasal sinus fluid levels or advanced mucosal thickening. No mastoid or middle ear effusion. Normal orbits. MRA HEAD FINDINGS POSTERIOR CIRCULATION: --Vertebral arteries: Normal --Inferior cerebellar arteries: Normal. --Basilar artery: Normal. --Superior cerebellar arteries: Normal. --Posterior cerebral arteries: Mild stenosis of the proximal left P2 segment. Normal right PCA. ANTERIOR CIRCULATION: --Intracranial internal carotid arteries: Normal. --Anterior cerebral arteries  (ACA): Normal. --Middle cerebral arteries (MCA): Normal. ANATOMIC VARIANTS: None IMPRESSION: 1. Small acute/early subacute infarct of the right caudate body. No hemorrhage or mass effect. 2. 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 3. Mild stenosis of the proximal left P2 segment. Electronically Signed   By: Ulyses Jarred M.D.   On: 05/20/2021 00:08   US RENAL  Result Date: 05/20/2021 CLINICAL DATA:  50 year old male with renal insufficiency. Diabetes and hypertension. EXAM: RENAL / URINARY TRACT ULTRASOUND COMPLETE COMPARISON:  None. FINDINGS: Right Kidney: Renal measurements: 13.4 x 5.3 x 5.4 cm = volume: 202 mL. Echogenicity within normal limits. No mass or hydronephrosis visualized. Left Kidney: Renal measurements: 13.5 x 7.5 x 7.1 cm = volume: 375 mL. Echogenicity within normal limits. No mass or hydronephrosis visualized. Bladder: Appears normal for degree of bladder distention. Both ureteral jets detected with Doppler. Other: Echogenic liver (image 2). IMPRESSION: 1. Normal ultrasound appearance of both kidneys and the urinary bladder. 2. Hepatic steatosis. Electronically Signed  By: Genevie Ann M.D.   On: 05/20/2021 06:45   ECHOCARDIOGRAM COMPLETE  Result Date: 05/20/2021    ECHOCARDIOGRAM REPORT   Patient Name:   KAMDEN LEAPER Turpin Date of Exam: 05/20/2021 Medical Rec #:  [id number]    Height:       74.0 in Accession #:    [id number]   Weight:       250.0 lb Date of Birth:  1971/02/05    BSA:          2.390 m Patient Age:    49 years     BP:           183/119 mmHg Patient Gender: M            HR:           94 bpm. Exam Location:  Inpatient Procedure: 2D Echo, Color Doppler, Cardiac Doppler and Intracardiac            Opacification Agent Indications:    Stroke I63.9  History:        Patient has no prior history of Echocardiogram examinations.                 Risk Factors:Diabetes and Hypertension.  Sonographer:    Bernadene Person RDCS Referring Phys: Myrtle  1. Left  ventricular ejection fraction, by estimation, is 60 to 65%. The left ventricle has normal function. The left ventricle has no regional wall motion abnormalities. There is mild concentric left ventricular hypertrophy. Left ventricular diastolic function could not be evaluated.  2. Right ventricular systolic function is normal. The right ventricular size is normal. Tricuspid regurgitation signal is inadequate for assessing PA pressure.  3. Left atrial size was mildly dilated.  4. The mitral valve is normal in structure. Trivial mitral valve regurgitation. No evidence of mitral stenosis.  5. The aortic valve is normal in structure. Aortic valve regurgitation is not visualized. No aortic stenosis is present.  6. The inferior vena cava is normal in size with <50% respiratory variability, suggesting right atrial pressure of 8 mmHg. FINDINGS  Left Ventricle: Left ventricular ejection fraction, by estimation, is 60 to 65%. The left ventricle has normal function. The left ventricle has no regional wall motion abnormalities. Definity contrast agent was given IV to delineate the left ventricular  endocardial borders. The left ventricular internal cavity size was normal in size. There is mild concentric left ventricular hypertrophy. Left ventricular diastolic function could not be evaluated. Right Ventricle: The right ventricular size is normal. No increase in right ventricular wall thickness. Right ventricular systolic function is normal. Tricuspid regurgitation signal is inadequate for assessing PA pressure. Left Atrium: Left atrial size was mildly dilated. Right Atrium: Right atrial size was normal in size. Pericardium: There is no evidence of pericardial effusion. Mitral Valve: The mitral valve is normal in structure. Trivial mitral valve regurgitation. No evidence of mitral valve stenosis. Tricuspid Valve: The tricuspid valve is normal in structure. Tricuspid valve regurgitation is trivial. No evidence of tricuspid  stenosis. Aortic Valve: The aortic valve is normal in structure. Aortic valve regurgitation is not visualized. No aortic stenosis is present. Pulmonic Valve: The pulmonic valve was normal in structure. Pulmonic valve regurgitation is not visualized. No evidence of pulmonic stenosis. Aorta: The aortic root is normal in size and structure. Venous: The inferior vena cava is normal in size with less than 50% respiratory variability, suggesting right atrial pressure of 8 mmHg. IAS/Shunts: No atrial level shunt detected by color  flow Doppler.  LEFT VENTRICLE PLAX 2D LVIDd:         5.30 cm  Diastology LVIDs:         3.90 cm  LV e' lateral: 8.76 cm/s LV PW:         1.20 cm LV IVS:        1.20 cm LVOT diam:     2.30 cm LV SV:         80 LV SV Index:   34 LVOT Area:     4.15 cm  RIGHT VENTRICLE RV S prime:     10.40 cm/s TAPSE (M-mode): 2.1 cm LEFT ATRIUM             Index       RIGHT ATRIUM           Index LA diam:        4.70 cm 1.97 cm/m  RA Area:     13.00 cm LA Vol (A2C):   85.5 ml 35.78 ml/m RA Volume:   26.10 ml  10.92 ml/m LA Vol (A4C):   89.6 ml 37.49 ml/m LA Biplane Vol: 93.3 ml 39.04 ml/m  AORTIC VALVE LVOT Vmax:   95.60 cm/s LVOT Vmean:  67.000 cm/s LVOT VTI:    0.193 m  AORTA Ao Root diam: 3.40 cm Ao Asc diam:  3.50 cm  SHUNTS Systemic VTI:  0.19 m Systemic Diam: 2.30 cm Fransico Him MD Electronically signed by Fransico Him MD Signature Date/Time: 05/20/2021/11:42:26 AM    Final    VAS US CAROTID (at [facility] and [facility] only)  Result Date: 05/20/2021 Carotid Arterial Duplex Study Patient Name:  LAZERICK SCHULZE  Date of Exam:   05/20/2021 Medical Rec #: [id number]     Accession #:    [id number] Date of Birth: 04/30/1971     Patient Gender: M Patient Age:   63Y Exam Location:  [facility] Procedure:      VAS US CAROTID Referring Phys: [id number] [facility] --------------------------------------------------------------------------------  Indications:       CVA. Risk Factors:      Hyperlipidemia, Diabetes.  Comparison Study:  no prior Performing Technologist: Archie Patten RVS  Examination Guidelines: A complete evaluation includes B-mode imaging, spectral Doppler, color Doppler, and power Doppler as needed of all accessible portions of each vessel. Bilateral testing is considered an integral part of a complete examination. Limited examinations for reoccurring indications may be performed as noted.  Right Carotid Findings: +----------+--------+--------+--------+------------------+--------+           PSV cm/sEDV cm/sStenosisPlaque DescriptionComments +----------+--------+--------+--------+------------------+--------+ CCA Prox  54      10              heterogenous               +----------+--------+--------+--------+------------------+--------+ CCA Distal71      16              heterogenous               +----------+--------+--------+--------+------------------+--------+ ICA Prox  126     51      40-59%  heterogenous               +----------+--------+--------+--------+------------------+--------+ ICA Mid   108     42                                         +----------+--------+--------+--------+------------------+--------+ ICA  Distal90      35                                         +----------+--------+--------+--------+------------------+--------+ ECA       151                                                +----------+--------+--------+--------+------------------+--------+ +----------+--------+-------+--------+-------------------+           PSV cm/sEDV cmsDescribeArm Pressure (mmHG) +----------+--------+-------+--------+-------------------+ [id number]                                         +----------+--------+-------+--------+-------------------+ +---------+--------+--+--------+--+---------+ VertebralPSV cm/s38EDV cm/s10Antegrade +---------+--------+--+--------+--+---------+  Left Carotid Findings:  +----------+--------+--------+--------+------------------+--------+           PSV cm/sEDV cm/sStenosisPlaque DescriptionComments +----------+--------+--------+--------+------------------+--------+ CCA Prox  74      10              heterogenous               +----------+--------+--------+--------+------------------+--------+ CCA Distal50      12              heterogenous               +----------+--------+--------+--------+------------------+--------+ ICA Prox  70      22      1-39%   heterogenous               +----------+--------+--------+--------+------------------+--------+ ICA Distal81      29                                         +----------+--------+--------+--------+------------------+--------+ ECA       113     8                                          +----------+--------+--------+--------+------------------+--------+ +----------+--------+--------+--------+-------------------+           PSV cm/sEDV cm/sDescribeArm Pressure (mmHG) +----------+--------+--------+--------+-------------------+ [id number]                                          +----------+--------+--------+--------+-------------------+ +---------+--------+--+--------+--+---------+ VertebralPSV cm/s29EDV cm/s10Antegrade +---------+--------+--+--------+--+---------+   Summary: Right Carotid: Velocities in the right ICA are consistent with a 40-59%                stenosis. Left Carotid: Velocities in the left ICA are consistent with a 1-39% stenosis. Vertebrals: Bilateral vertebral arteries demonstrate antegrade flow. *See table(s) above for measurements and observations.     Preliminary    VAS US RENAL ARTERY DUPLEX  Result Date: 05/21/2021 ABDOMINAL VISCERAL Patient Name:  JAYVIAN PESTA  Date of Exam:   05/21/2021 Medical Rec #: [id number]     Accession #:    [id number] Date of Birth: [birth year]     Patient Gender: M Patient Age:   8Y Exam Location:  [facility]  Procedure:  VAS US RENAL ARTERY DUPLEX Referring Phys: [id number] [facility] -------------------------------------------------------------------------------- Indications: Hypertension High Risk Factors: Diabetes. Limitations: Air/bowel gas. Comparison Study: No prior study Performing Technologist: Maudry Mayhew MHA, RDMS, RVT, RDCS  Examination Guidelines: A complete evaluation includes B-mode imaging, spectral Doppler, color Doppler, and power Doppler as needed of all accessible portions of each vessel. Bilateral testing is considered an integral part of a complete examination. Limited examinations for reoccurring indications may be performed as noted.  Duplex Findings: +--------------------+--------+--------+------+--------+ Mesenteric          PSV cm/sEDV cm/sPlaqueComments +--------------------+--------+--------+------+--------+ Aorta Prox             91                          +--------------------+--------+--------+------+--------+ Celiac Artery Origin  338      94                  +--------------------+--------+--------+------+--------+ SMA Proximal          235      24                  +--------------------+--------+--------+------+--------+    +------------------+--------+--------+-------+ Right Renal ArteryPSV cm/sEDV cm/sComment +------------------+--------+--------+-------+ Origin              103      15           +------------------+--------+--------+-------+ Proximal             69      9            +------------------+--------+--------+-------+ Mid                  43      9            +------------------+--------+--------+-------+ Distal               42      10           +------------------+--------+--------+-------+ +-----------------+--------+--------+-------+ Left Renal ArteryPSV cm/sEDV cm/sComment +-----------------+--------+--------+-------+ Origin             106      27            +-----------------+--------+--------+-------+ Proximal            17      5            +-----------------+--------+--------+-------+ Mid                 23      9            +-----------------+--------+--------+-------+ Distal              20      5            +-----------------+--------+--------+-------+ +------------+--------+--------+----+-----------+--------+--------+----+ Right KidneyPSV cm/sEDV cm/sRI  Left KidneyPSV cm/sEDV cm/sRI   +------------+--------+--------+----+-----------+--------+--------+----+ Upper Pole  20      8       0.62Upper Pole 34      6       0.82 +------------+--------+--------+----+-----------+--------+--------+----+ Mid         38      7       0.82Mid        21      5       0.78 +------------+--------+--------+----+-----------+--------+--------+----+ Lower Pole  68      8       0.88Lower Pole 27  5       0.82 +------------+--------+--------+----+-----------+--------+--------+----+ Hilar       21      5       0.78Hilar      24      6       0.75 +------------+--------+--------+----+-----------+--------+--------+----+ +------------------+---------+------------------+---------+ Right Kidney               Left Kidney                 +------------------+---------+------------------+---------+ RAR                        RAR                         +------------------+---------+------------------+---------+ RAR (manual)      1.13     RAR (manual)      1.16      +------------------+---------+------------------+---------+ Cortex            10/5 cm/sCortex            17/4 cm/s +------------------+---------+------------------+---------+ Cortex thickness           Corex thickness             +------------------+---------+------------------+---------+ Kidney length (cm)12.60    Kidney length (cm)12.80     +------------------+---------+------------------+---------+  Summary: Renal:  Right: No evidence of  right renal artery stenosis. RRV flow present. Left:  No evidence of left renal artery stenosis. LRV flow present. Mesenteric: Normal Superior Mesenteric artery findings. 70 to 99% stenosis in the celiac artery.  *See table(s) above for measurements and observations.     Preliminary     Subjective: Seen and examined at bedside and was wanting to go home.  Denies any chest pain, lightheadedness or dizziness.  No shortness of breath.  Felt okay and is urinating well.  Nephrology cleared the patient for discharge and recommend outpatient renal biopsy and neurology also cleared the patient for discharge given that the patient will need to be on dual antiplatelet therapy for 3 weeks and then just Plavix alone.  All questions were answered to the patient's satisfaction and he is deemed stable to be discharged at this time  Discharge Exam: Vitals:   05/21/21 1103 05/21/21 1513  BP: (!) 193/108 (!) 199/127  Pulse: 94 96  Resp: 18   Temp: 98 F (36.7 C) 98.5 F (36.9 C)  SpO2:  100%   Vitals:   05/21/21 0503 05/21/21 0731 05/21/21 1103 05/21/21 1513  BP: (!) 196/123 (!) 188/113 (!) 193/108 (!) 199/127  Pulse: (!) 105 96 94 96  Resp: '20 18 18   '$ Temp: 98.5 F (36.9 C) 98.2 F (36.8 C) 98 F (36.7 C) 98.5 F (36.9 C)  TempSrc: Oral Oral Oral Oral  SpO2: 99% 98%  100%  Weight:      Height:       General: Pt is alert, awake, not in acute distress Cardiovascular: RRR, S1/S2 +, no rubs, no gallops Respiratory: Diminished  bilaterally, no wheezing, no rhonchi Abdominal: Soft, NT, distended secondary to body habitus, bowel sounds + Extremities: Mild 1+ edema, no cyanosis  The results of significant diagnostics from this hospitalization (including imaging, microbiology, ancillary and laboratory) are listed below for reference.    Microbiology: Recent Results (from the past 240 hour(s))  SARS CORONAVIRUS 2 (TAT 6-24 HRS) Nasopharyngeal Nasopharyngeal Swab     Status: None   Collection Time:  05/20/21  9:11 PM  Specimen: Nasopharyngeal Swab  Result Value Ref Range Status   SARS Coronavirus 2 NEGATIVE NEGATIVE Final    Comment: (NOTE) SARS-CoV-2 target nucleic acids are NOT DETECTED.  The SARS-CoV-2 RNA is generally detectable in upper and lower respiratory specimens during the acute phase of infection. Negative results do not preclude SARS-CoV-2 infection, do not rule out co-infections with other pathogens, and should not be used as the sole basis for treatment or other patient management decisions. Negative results must be combined with clinical observations, patient history, and epidemiological information. The expected result is Negative.  Fact Sheet for Patients: SugarRoll.be  Fact Sheet for Healthcare Providers: https://www.woods-mathews.com/  This test is not yet approved or cleared by the [place] FDA and  has been authorized for detection and/or diagnosis of SARS-CoV-2 by FDA under an Emergency Use Authorization (EUA). This EUA will remain  in effect (meaning this test can be used) for the duration of the COVID-19 declaration under Se ction 564(b)(1) of the Act, 21 U.S.C. section 360bbb-3(b)(1), unless the authorization is terminated or revoked sooner.  Performed at [facility], [place] [address], [place], [place] [address]      Labs: BNP (last 3 results) No results for input(s): BNP in the last 8760 hours. Basic Metabolic Panel: Recent Labs  Lab 05/19/21 1950 05/19/21 2044 05/21/21 0257  NA 132* 136 135  K 4.6 4.7 4.5  CL 103 106 104  CO2 20*  --  21*  GLUCOSE 174* 173* 208*  BUN 44* 43* 45*  CREATININE 4.60* 5.00* 4.40*  CALCIUM 8.9  --  8.9  PHOS  --   --  4.5   Liver Function Tests: Recent Labs  Lab 05/19/21 1950 05/21/21 0257  AST 19  --   ALT 20  --   ALKPHOS 94  --   BILITOT 0.6  --   PROT 6.9  --   ALBUMIN 3.4* 2.9*   No results for input(s): LIPASE, AMYLASE in the last 168  hours. No results for input(s): AMMONIA in the last 168 hours. CBC: Recent Labs  Lab 05/19/21 1950 05/19/21 2044 05/21/21 0257  WBC 9.7  --  8.9  NEUTROABS 6.1  --   --   HGB 11.0* 10.9* 10.0*  HCT 32.1* 32.0* 28.8*  MCV 85.4  --  83.7  PLT 280  --  222   Cardiac Enzymes: Recent Labs  Lab 05/20/21 1417  CKTOTAL 253   BNP: Invalid input(s): POCBNP CBG: Recent Labs  Lab 05/20/21 1154 05/20/21 1848 05/20/21 2140 05/21/21 0604 05/21/21 1215  GLUCAP 221* 224* 199* 160* 200*   D-Dimer No results for input(s): DDIMER in the last 72 hours. Hgb A1c Recent Labs    05/20/21 0527  HGBA1C 7.4*   Lipid Profile Recent Labs    05/21/21 0257  CHOL 271*  HDL 31*  LDLCALC UNABLE TO CALCULATE IF TRIGLYCERIDE OVER 400 mg/dL  TRIG 456*  CHOLHDL 8.7  LDLDIRECT 122.9*   Thyroid function studies No results for input(s): TSH, T4TOTAL, T3FREE, THYROIDAB in the last 72 hours.  Invalid input(s): FREET3 Anemia work up No results for input(s): VITAMINB12, FOLATE, FERRITIN, TIBC, IRON, RETICCTPCT in the last 72 hours. Urinalysis    Component Value Date/Time   COLORURINE YELLOW 05/19/2021 1950   APPEARANCEUR HAZY (A) 05/19/2021 1950   LABSPEC 1.018 05/19/2021 1950   PHURINE 5.0 05/19/2021 1950   GLUCOSEU 50 (A) 05/19/2021 1950   HGBUR NEGATIVE 05/19/2021 [place] NEGATIVE 05/19/2021 1950   KETONESUR  NEGATIVE 05/19/2021 1950   PROTEINUR >=300 (A) 05/19/2021 1950   NITRITE NEGATIVE 05/19/2021 1950   LEUKOCYTESUR NEGATIVE 05/19/2021 1950   Sepsis Labs Invalid input(s): PROCALCITONIN,  WBC,  LACTICIDVEN Microbiology Recent Results (from the past 240 hour(s))  SARS CORONAVIRUS 2 (TAT 6-24 HRS) Nasopharyngeal Nasopharyngeal Swab     Status: None   Collection Time: 05/20/21  9:11 PM   Specimen: Nasopharyngeal Swab  Result Value Ref Range Status   SARS Coronavirus 2 NEGATIVE NEGATIVE Final    Comment: (NOTE) SARS-CoV-2 target nucleic acids are NOT DETECTED.  The  SARS-CoV-2 RNA is generally detectable in upper and lower respiratory specimens during the acute phase of infection. Negative results do not preclude SARS-CoV-2 infection, do not rule out co-infections with other pathogens, and should not be used as the sole basis for treatment or other patient management decisions. Negative results must be combined with clinical observations, patient history, and epidemiological information. The expected result is Negative.  Fact Sheet for Patients: SugarRoll.be  Fact Sheet for Healthcare Providers: https://www.woods-mathews.com/  This test is not yet approved or cleared by the [place] FDA and  has been authorized for detection and/or diagnosis of SARS-CoV-2 by FDA under an Emergency Use Authorization (EUA). This EUA will remain  in effect (meaning this test can be used) for the duration of the COVID-19 declaration under Se ction 564(b)(1) of the Act, 21 U.S.C. section 360bbb-3(b)(1), unless the authorization is terminated or revoked sooner.  Performed at [facility], [place] [address] [place], [place] [address]    Time coordinating discharge: 35 minutes  SIGNED:  Kerney Elbe, DO [facility] 05/21/2021, 4:11 PM Pager is on [phone]  If 7PM-7AM, please contact night-coverage [email or web address]

## 2021-05-21 NOTE — Progress Notes (Signed)
[name] Resident Progress Note   Subjective:   HD 1 Overnight, no acute events reported. Continues to be hypertensive.   This morning, patient evaluated at bedside. Reports overall improvement in symptoms but is concerned about persistent hypertension. He expressed strong desires to go home today.   Objective Vitals:   05/20/21 2139 05/21/21 0011 05/21/21 0503 05/21/21 0731  BP: (!) 198/113 (!) 194/112 (!) 196/123 (!) 188/113  Pulse: (!) 102 97 (!) 105 96  Resp: '17 20 20 18  '$ Temp: 98.3 F (36.8 C) 98.5 F (36.9 C) 98.5 F (36.9 C) 98.2 F (36.8 C)  TempSrc: Oral Oral Oral Oral  SpO2: 100% 99% 99% 98%  Weight:      Height:       Physical Exam General: middle aged male, no acute distress Heart: RRR, nl S1 and S2 present, no m/r/g Lungs: CTAB, on room air Abdomen: obese, soft, nontender, +BS Extremities: no peripheral edema, no rashes or lesions, distal pulses present; strength 5/5 in all extremities   Additional Objective Labs: Basic Metabolic Panel: Recent Labs  Lab 05/19/21 1950 05/19/21 2044 05/21/21 0257  NA 132* 136 135  K 4.6 4.7 4.5  CL 103 106 104  CO2 20*  --  21*  GLUCOSE 174* 173* 208*  BUN 44* 43* 45*  CREATININE 4.60* 5.00* 4.40*  CALCIUM 8.9  --  8.9  PHOS  --   --  4.5   Liver Function Tests: Recent Labs  Lab 05/19/21 1950 05/21/21 0257  AST 19  --   ALT 20  --   ALKPHOS 94  --   BILITOT 0.6  --   PROT 6.9  --   ALBUMIN 3.4* 2.9*   No results for input(s): LIPASE, AMYLASE in the last 168 hours. CBC: Recent Labs  Lab 05/19/21 1950 05/19/21 2044 05/21/21 0257  WBC 9.7  --  8.9  NEUTROABS 6.1  --   --   HGB 11.0* 10.9* 10.0*  HCT 32.1* 32.0* 28.8*  MCV 85.4  --  83.7  PLT 280  --  222    Cardiac Enzymes: Recent Labs  Lab 05/20/21 1417  CKTOTAL 253   CBG: Recent Labs  Lab 05/20/21 0741 05/20/21 1154 05/20/21 1848 05/20/21 2140 05/21/21 0604  GLUCAP 180* 221* 224* 199* 160*     Studies/Results: MR ANGIO HEAD WO CONTRAST  Result Date: 05/20/2021 CLINICAL DATA:  Left facial droop EXAM: MRI HEAD WITHOUT CONTRAST MRA HEAD WITHOUT CONTRAST TECHNIQUE: Multiplanar, multi-echo pulse sequences of the brain and surrounding structures were acquired without intravenous contrast. Angiographic images of the Circle of Willis were acquired using MRA technique without intravenous contrast. COMPARISON:  No pertinent prior exam. FINDINGS: MRI HEAD FINDINGS Brain: Small acute/early subacute infarct the right caudate body. 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 There is multifocal hyperintense T2-weighted signal within the white matter. Parenchymal volume and CSF spaces are normal. 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 The midline structures are normal. Vascular: Major flow voids are preserved. Skull and upper cervical spine: Normal calvarium and skull base. Visualized upper cervical spine and soft tissues are normal. Sinuses/Orbits:No paranasal sinus fluid levels or advanced mucosal thickening. No mastoid or middle ear effusion. Normal orbits. MRA HEAD FINDINGS POSTERIOR CIRCULATION: --Vertebral arteries: Normal --Inferior cerebellar arteries: Normal. --Basilar artery: Normal. --Superior cerebellar arteries: Normal. --Posterior cerebral arteries: Mild stenosis of the proximal left P2 segment. Normal right PCA. ANTERIOR CIRCULATION: --Intracranial internal carotid arteries: Normal. --Anterior cerebral  arteries (ACA): Normal. --Middle cerebral arteries (MCA): Normal. ANATOMIC VARIANTS: None IMPRESSION: 1. Small acute/early subacute infarct of the right caudate body. No hemorrhage or mass effect. 2. 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 3. Mild stenosis of the proximal left P2 segment. Electronically Signed   By: Ulyses Jarred M.D.   On: 05/20/2021 00:08   MR BRAIN WO CONTRAST  Result Date: 05/20/2021 CLINICAL DATA:  Left facial  droop EXAM: MRI HEAD WITHOUT CONTRAST MRA HEAD WITHOUT CONTRAST TECHNIQUE: Multiplanar, multi-echo pulse sequences of the brain and surrounding structures were acquired without intravenous contrast. Angiographic images of the Circle of Willis were acquired using MRA technique without intravenous contrast. COMPARISON:  No pertinent prior exam. FINDINGS: MRI HEAD FINDINGS Brain: Small acute/early subacute infarct the right caudate body. 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 There is multifocal hyperintense T2-weighted signal within the white matter. Parenchymal volume and CSF spaces are normal. 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 The midline structures are normal. Vascular: Major flow voids are preserved. Skull and upper cervical spine: Normal calvarium and skull base. Visualized upper cervical spine and soft tissues are normal. Sinuses/Orbits:No paranasal sinus fluid levels or advanced mucosal thickening. No mastoid or middle ear effusion. Normal orbits. MRA HEAD FINDINGS POSTERIOR CIRCULATION: --Vertebral arteries: Normal --Inferior cerebellar arteries: Normal. --Basilar artery: Normal. --Superior cerebellar arteries: Normal. --Posterior cerebral arteries: Mild stenosis of the proximal left P2 segment. Normal right PCA. ANTERIOR CIRCULATION: --Intracranial internal carotid arteries: Normal. --Anterior cerebral arteries (ACA): Normal. --Middle cerebral arteries (MCA): Normal. ANATOMIC VARIANTS: None IMPRESSION: 1. Small acute/early subacute infarct of the right caudate body. No hemorrhage or mass effect. 2. 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 3. Mild stenosis of the proximal left P2 segment. Electronically Signed   By: Ulyses Jarred M.D.   On: 05/20/2021 00:08   US RENAL  Result Date: 05/20/2021 CLINICAL DATA:  51 year old male with renal insufficiency. Diabetes and hypertension. EXAM: RENAL / URINARY TRACT ULTRASOUND COMPLETE COMPARISON:   None. FINDINGS: Right Kidney: Renal measurements: 13.4 x 5.3 x 5.4 cm = volume: 202 mL. Echogenicity within normal limits. No mass or hydronephrosis visualized. Left Kidney: Renal measurements: 13.5 x 7.5 x 7.1 cm = volume: 375 mL. Echogenicity within normal limits. No mass or hydronephrosis visualized. Bladder: Appears normal for degree of bladder distention. Both ureteral jets detected with Doppler. Other: Echogenic liver (image 2). IMPRESSION: 1. Normal ultrasound appearance of both kidneys and the urinary bladder. 2. Hepatic steatosis. Electronically Signed   By: Genevie Ann M.D.   On: 05/20/2021 06:45   ECHOCARDIOGRAM COMPLETE  Result Date: 05/20/2021    ECHOCARDIOGRAM REPORT   Patient Name:   Bruce Little Mclin Date of Exam: 05/20/2021 Medical Rec #:  [id number]    Height:       74.0 in Accession #:    [id number]   Weight:       250.0 lb Date of Birth:  27-Dec-1970    BSA:          2.390 m Patient Age:    59 years     BP:           183/119 mmHg Patient Gender: M            HR:           94 bpm. Exam Location:  Inpatient Procedure: 2D Echo, Color Doppler, Cardiac Doppler and Intracardiac  Opacification Agent Indications:    Stroke I63.9  History:        Patient has no prior history of Echocardiogram examinations.                 Risk Factors:Diabetes and Hypertension.  Sonographer:    Bernadene Person RDCS Referring Phys: [place]  1. Left ventricular ejection fraction, by estimation, is 60 to 65%. The left ventricle has normal function. The left ventricle has no regional wall motion abnormalities. There is mild concentric left ventricular hypertrophy. Left ventricular diastolic function could not be evaluated.  2. Right ventricular systolic function is normal. The right ventricular size is normal. Tricuspid regurgitation signal is inadequate for assessing PA pressure.  3. Left atrial size was mildly dilated.  4. The mitral valve is normal in structure. Trivial mitral valve regurgitation.  No evidence of mitral stenosis.  5. The aortic valve is normal in structure. Aortic valve regurgitation is not visualized. No aortic stenosis is present.  6. The inferior vena cava is normal in size with <50% respiratory variability, suggesting right atrial pressure of 8 mmHg. FINDINGS  Left Ventricle: Left ventricular ejection fraction, by estimation, is 60 to 65%. The left ventricle has normal function. The left ventricle has no regional wall motion abnormalities. Definity contrast agent was given IV to delineate the left ventricular  endocardial borders. The left ventricular internal cavity size was normal in size. There is mild concentric left ventricular hypertrophy. Left ventricular diastolic function could not be evaluated. Right Ventricle: The right ventricular size is normal. No increase in right ventricular wall thickness. Right ventricular systolic function is normal. Tricuspid regurgitation signal is inadequate for assessing PA pressure. Left Atrium: Left atrial size was mildly dilated. Right Atrium: Right atrial size was normal in size. Pericardium: There is no evidence of pericardial effusion. Mitral Valve: The mitral valve is normal in structure. Trivial mitral valve regurgitation. No evidence of mitral valve stenosis. Tricuspid Valve: The tricuspid valve is normal in structure. Tricuspid valve regurgitation is trivial. No evidence of tricuspid stenosis. Aortic Valve: The aortic valve is normal in structure. Aortic valve regurgitation is not visualized. No aortic stenosis is present. Pulmonic Valve: The pulmonic valve was normal in structure. Pulmonic valve regurgitation is not visualized. No evidence of pulmonic stenosis. Aorta: The aortic root is normal in size and structure. Venous: The inferior vena cava is normal in size with less than 50% respiratory variability, suggesting right atrial pressure of 8 mmHg. IAS/Shunts: No atrial level shunt detected by color flow Doppler.  LEFT VENTRICLE PLAX 2D  LVIDd:         5.30 cm  Diastology LVIDs:         3.90 cm  LV e' lateral: 8.76 cm/s LV PW:         1.20 cm LV IVS:        1.20 cm LVOT diam:     2.30 cm LV SV:         80 LV SV Index:   34 LVOT Area:     4.15 cm  RIGHT VENTRICLE RV S prime:     10.40 cm/s TAPSE (M-mode): 2.1 cm LEFT ATRIUM             Index       RIGHT ATRIUM           Index LA diam:        4.70 cm 1.97 cm/m  RA Area:     13.00 cm  LA Vol (A2C):   85.5 ml 35.78 ml/m RA Volume:   26.10 ml  10.92 ml/m LA Vol (A4C):   89.6 ml 37.49 ml/m LA Biplane Vol: 93.3 ml 39.04 ml/m  AORTIC VALVE LVOT Vmax:   95.60 cm/s LVOT Vmean:  67.000 cm/s LVOT VTI:    0.193 m  AORTA Ao Root diam: 3.40 cm Ao Asc diam:  3.50 cm  SHUNTS Systemic VTI:  0.19 m Systemic Diam: 2.30 cm Fransico Him MD Electronically signed by Fransico Him MD Signature Date/Time: 05/20/2021/11:42:26 AM    Final    VAS US CAROTID (at [facility] and [facility] only)  Result Date: 05/20/2021 Carotid Arterial Duplex Study Patient Name:  Bruce Little  Date of Exam:   05/20/2021 Medical Rec #: [id number]     Accession #:    [id number] Date of Birth: December 09, 1970     Patient Gender: M Patient Age:   43Y Exam Location:  [facility] Procedure:      VAS US CAROTID Referring Phys: [id number] [employer] [facility] --------------------------------------------------------------------------------  Indications:       CVA. Risk Factors:      Hyperlipidemia, Diabetes. Comparison Study:  no prior Performing Technologist: Archie Patten RVS  Examination Guidelines: A complete evaluation includes B-mode imaging, spectral Doppler, color Doppler, and power Doppler as needed of all accessible portions of each vessel. Bilateral testing is considered an integral part of a complete examination. Limited examinations for reoccurring indications may be performed as noted.  Right Carotid Findings: +----------+--------+--------+--------+------------------+--------+           PSV cm/sEDV cm/sStenosisPlaque DescriptionComments  +----------+--------+--------+--------+------------------+--------+ CCA Prox  54      10              heterogenous               +----------+--------+--------+--------+------------------+--------+ CCA Distal71      16              heterogenous               +----------+--------+--------+--------+------------------+--------+ ICA Prox  126     51      40-59%  heterogenous               +----------+--------+--------+--------+------------------+--------+ ICA Mid   108     42                                         +----------+--------+--------+--------+------------------+--------+ ICA Distal90      35                                         +----------+--------+--------+--------+------------------+--------+ ECA       151                                                +----------+--------+--------+--------+------------------+--------+ +----------+--------+-------+--------+-------------------+           PSV cm/sEDV cmsDescribeArm Pressure (mmHG) +----------+--------+-------+--------+-------------------+ [id number]                                         +----------+--------+-------+--------+-------------------+ +---------+--------+--+--------+--+---------+ VertebralPSV  cm/s38EDV cm/s10Antegrade +---------+--------+--+--------+--+---------+  Left Carotid Findings: +----------+--------+--------+--------+------------------+--------+           PSV cm/sEDV cm/sStenosisPlaque DescriptionComments +----------+--------+--------+--------+------------------+--------+ CCA Prox  74      10              heterogenous               +----------+--------+--------+--------+------------------+--------+ CCA Distal50      12              heterogenous               +----------+--------+--------+--------+------------------+--------+ ICA Prox  70      22      1-39%   heterogenous                +----------+--------+--------+--------+------------------+--------+ ICA Distal81      29                                         +----------+--------+--------+--------+------------------+--------+ ECA       113     8                                          +----------+--------+--------+--------+------------------+--------+ +----------+--------+--------+--------+-------------------+           PSV cm/sEDV cm/sDescribeArm Pressure (mmHG) +----------+--------+--------+--------+-------------------+ [id number]                                          +----------+--------+--------+--------+-------------------+ +---------+--------+--+--------+--+---------+ VertebralPSV cm/s29EDV cm/s10Antegrade +---------+--------+--+--------+--+---------+   Summary: Right Carotid: Velocities in the right ICA are consistent with a 40-59%                stenosis. Left Carotid: Velocities in the left ICA are consistent with a 1-39% stenosis. Vertebrals: Bilateral vertebral arteries demonstrate antegrade flow. *See table(s) above for measurements and observations.     Preliminary    Medications:   aspirin EC  81 mg Oral Daily   clopidogrel  75 mg Oral Daily   heparin  5,000 Units Subcutaneous Q8H   insulin aspart  0-9 Units Subcutaneous TID WC   liraglutide  1.8 mg Subcutaneous Daily   multivitamin with minerals  1 tablet Oral Daily   pantoprazole  40 mg Oral Daily   Assessment/Plan: 1. Elevated sCr, presumably AKI: Presented with BUN/Cr 43/5.0 (unknown baseline) that only slightly improved to 45/4.4 with fluid resuscitation. FeNa 3.9% suggestive of intrinsic etiology, concerning for possible glomerulonephritis in setting of proteinuria. Renal US without any acute findings noted. Vascular US of renal arteries also without stenosis. Patient would benefit from renal biopsy for further evaluation; however, will need to be off antiplatelet therapy for 5 days prior to procedure. Will have him  scheduled for outpatient IR guided renal biopsy. Continuing to have good urine output. 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 He does have a history of diabetes and hypertension which could be contributing; however, notes that his chronic illnesses are generally well controlled. No abnormal rashes noted. ANA, C3/C4 complement wnl, HIV negative. Hepatitis labs pending. Anti-PLA2R Ab and SPEP pending. Recommend outpatient renal biopsy as above.  Acute ischemic stroke - symptoms are resolved at this  time. Currently on DAPT. Stroke work up per neurology and primary team. Hypertension - Started on amlodipine '5mg'$  daily. Would recommend holding ARB in setting of elevated sCr as above.  Hyperlipidemia: Atorvastatin '80mg'$  daily.  Diabetes: HbA1c 7.4. Continued on Victoza. Metformin held in setting of AKI as noted above. On SSI.   Harvie Heck, MD Internal Medicine, PGY-2 05/21/21 4:32 PM Pager # [phone]

## 2021-05-21 NOTE — Progress Notes (Signed)
OT Cancellation Note  Patient Details Name: Bruce Little MRN: [id number] DOB: [birth year]   Cancelled Treatment:    Reason Eval/Treat Not Completed: OT screened, no needs identified, will sign off  Metta Clines 05/21/2021, 11:18 AM 05/21/2021  Denice Paradise, OTR/L  Acute Rehabilitation Services  Office:  [phone]

## 2021-05-21 NOTE — Progress Notes (Signed)
neuro

## 2021-05-21 NOTE — Evaluation (Signed)
Physical Therapy Evaluation & Discharge Patient Details Name: Bruce Little MRN: [id number] DOB: 03/17/1971 Today's Date: 05/21/2021   History of Present Illness  50 y/o male presented to ED on 7/11 for L sided weakness and slight L facial droop since 7/10. MRI showed small acute/early subacute infarct of R caudate body. PMH: DM, HTN, renal insufficiency.  Clinical Impression  PTA, patient lives with wife and teenage daughter and reports independence with mobility. Patient currently functioning at independent level with no AD. Educated patient on importance of mobility at discharge to assist with return to [facility], patient verbalized understanding. No further skilled PT needs required acutely. No PT follow up recommended at this time.     Follow Up Recommendations No PT follow up    Equipment Recommendations  None recommended by PT    Recommendations for Other Services       Precautions / Restrictions Precautions Precautions: None Restrictions Weight Bearing Restrictions: No      Mobility  Bed Mobility Overal bed mobility: Independent                  Transfers Overall transfer level: Independent                  Ambulation/Gait Ambulation/Gait assistance: Independent Gait Distance (Feet): 200 Feet Assistive device: None Gait Pattern/deviations: WFL(Within Functional Limits);Drifts right/left   Gait velocity interpretation: >4.37 ft/sec, indicative of normal walking speed    Stairs Stairs: Yes Stairs assistance: Independent Stair Management: One rail Right;Alternating pattern;Forwards Number of Stairs: 12    Wheelchair Mobility    Modified Rankin (Stroke Patients Only) Modified Rankin (Stroke Patients Only) Pre-Morbid Rankin Score: No symptoms Modified Rankin: No symptoms     Balance Overall balance assessment: No apparent balance deficits (not formally assessed)                                           Pertinent  Vitals/Pain Pain Assessment: No/denies pain    Home Living Family/patient expects to be discharged to:: Private residence Living Arrangements: Spouse/significant other;Children Available Help at Discharge: Family;Available 24 hours/day Type of Home: House Home Access: Stairs to enter   [employer] of Steps: 1 Home Layout: Two level Home Equipment: None      Prior Function Level of Independence: Independent         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     Hand Dominance        Extremity/Trunk Assessment   Upper Extremity Assessment Upper Extremity Assessment: Overall WFL for tasks assessed    Lower Extremity Assessment Lower Extremity Assessment: Overall WFL for tasks assessed    Cervical / Trunk Assessment Cervical / Trunk Assessment: Normal  Communication   Communication: No difficulties  Cognition Arousal/Alertness: Awake/alert Behavior During Therapy: WFL for tasks assessed/performed Overall Cognitive Status: Within Functional Limits for tasks assessed                                        General Comments      Exercises     Assessment/Plan    PT Assessment Patent does not need any further PT services  PT Problem List         PT Treatment Interventions      PT Goals (Current goals can be found in the Care  Plan section)  Acute Rehab PT Goals Patient Stated Goal: to go home PT Goal Formulation: All assessment and education complete, DC therapy    Frequency     Barriers to discharge        Co-evaluation               AM-PAC PT "6 Clicks" Mobility  Outcome Measure Help needed turning from your back to your side while in a flat bed without using bedrails?: None Help needed moving from lying on your back to sitting on the side of a flat bed without using bedrails?: None Help needed moving to and from a bed to a chair (including a wheelchair)?: None Help needed standing up from a chair using your arms (e.g.,  wheelchair or bedside chair)?: None Help needed to walk in hospital room?: None Help needed climbing 3-5 steps with a railing? : None 6 Click Score: 24    End of Session   Activity Tolerance: Patient tolerated treatment well Patient left: in bed;with call bell/phone within reach Nurse Communication: Mobility status PT Visit Diagnosis: Muscle weakness (generalized) (M62.81)    Time: [id number] PT Time Calculation (min) (ACUTE ONLY): 10 min   Charges:   PT Evaluation $PT Eval Low Complexity: 1 Low          Alysah Carton A. Gilford Rile PT, DPT Acute Rehabilitation Services Pager [phone] Office [phone]   Linna Hoff 05/21/2021, 9:45 AM

## 2021-05-21 NOTE — Evaluation (Addendum)
Speech Language Pathology Evaluation Patient Details Name: Bruce Little MRN: [id number] DOB: 1971/04/15 Today's Date: 05/21/2021 Time: [id number] SLP Time Calculation (min) (ACUTE ONLY): 22 min  Problem List:  Patient Active Problem List   Diagnosis Date Noted   Renal insufficiency 05/20/2021   DM2 (diabetes mellitus, type 2) ([place]) 05/20/2021   Acute ischemic stroke ([place]) 05/20/2021   HTN (hypertension) 05/20/2021   Past Medical History:  Past Medical History:  Diagnosis Date   DM2 (diabetes mellitus, type 2) ([place])    HTN (hypertension)    Past Surgical History: History reviewed. No pertinent surgical history. HPI:  Bruce Little is a 50 y.o. male with medical history significant of DM2, HTN.     Pt woke up 5AM Monday morning with L sided numbness and weakness.  Symptoms were mild.  Decided to monitor symptoms and go about his day.  Symptoms persisted without improving, began to develop intermittent fatigue.  Presents to ED for eval. MRI on 05/20/21 indicated small early subacute infarct of R caudate body; no hemorrhage or mass effect. Passed [facility] swallow screen on 05/20/21.  Speech/language evaluation initiated.  Assessment / Plan / Recommendation Clinical Impression  Pt assessed via SLUMS ([place] Mental Status Examination) with a score obtained of 30/30 indicating cognitive functioning within a typical range for this assessment.  No deficits noted in areas of orientation, attention, memory and/or graphic expression/visuospatial skills.  Pt's speech was intelligible within complex conversation.  Slight left facial asymmetry noted at rest, but this is not impacting pt's swallow function and/or speech intelligibility.  ST will s/o at this time as pt is functioning WNL for cognition.  Thank you for this consult.    SLP Assessment  SLP Recommendation/Assessment: Patient does not need any further Speech Language Pathology Services SLP Visit Diagnosis: Cognitive communication  deficit (R41.841)    Follow Up Recommendations  None    Frequency and Duration Other (Comment) (evaluation only)         SLP Evaluation Cognition  Overall Cognitive Status: Within Functional Limits for tasks assessed Arousal/Alertness: Awake/alert Orientation Level: Oriented X4 Attention: Sustained Sustained Attention: Appears intact Memory: Appears intact Immediate Memory Recall: Sock;Blue;Bed Memory Recall Sock: Without Cue Memory Recall Blue: Without Cue Memory Recall Bed: Without Cue Awareness: Appears intact Problem Solving: Appears intact Safety/Judgment: Appears intact       Comprehension  Auditory Comprehension Overall Auditory Comprehension: Appears within functional limits for tasks assessed Conversation: Complex Visual Recognition/Discrimination Discrimination: Within Function Limits Reading Comprehension Reading Status: Within funtional limits    Expression Expression Primary Mode of Expression: Verbal Verbal Expression Overall Verbal Expression: Appears within functional limits for tasks assessed Initiation: No impairment Level of Generative/Spontaneous Verbalization: Conversation Repetition: No impairment Naming: No impairment Pragmatics: No impairment Non-Verbal Means of Communication: Not applicable Written Expression Dominant Hand: Right Written Expression: Within Functional Limits   Oral / Motor  Oral Motor/Sensory Function Overall Oral Motor/Sensory Function: Mild impairment Facial ROM: Within Functional Limits Facial Symmetry: Abnormal symmetry left (slight at rest) Facial Strength: Within Functional Limits Facial Sensation: Within Functional Limits Lingual ROM: Within Functional Limits Lingual Symmetry: Within Functional Limits Lingual Strength: Within Functional Limits Lingual Sensation: Within Functional Limits Velum: Within Functional Limits Mandible: Within Functional Limits Motor Speech Overall Motor Speech: Appears within  functional limits for tasks assessed Respiration: Within functional limits Phonation: Normal Resonance: Within functional limits Articulation: Within functional limitis Intelligibility: Intelligible Motor Planning: Witnin functional limits Motor Speech Errors: Not applicable  Elvina Sidle, M.S., [place] 05/21/2021, 11:05 AM

## 2021-05-21 NOTE — Progress Notes (Signed)
Per request, IR has placed an outpatient order for a random renal biopsy. A scheduler from our office will call the patient with the date/time of procedure.   Soyla Dryer, [place] [phone] 05/21/2021, 4:43 PM

## 2021-05-21 NOTE — Progress Notes (Addendum)
STROKE TEAM PROGRESS NOTE   INTERVAL HISTORY No one is at the bedside. Nephrology consulted as Cr continues to rise. Left side symptoms have improved. Reviewed stroke finding and stroke risks with pt as well as tc plan thus far.   Vitals:   05/20/21 2139 05/21/21 0011 05/21/21 0503 05/21/21 0731  BP: (!) 198/113 (!) 194/112 (!) 196/123 (!) 188/113  Pulse: (!) 102 97 (!) 105 96  Resp: '17 20 20 18  '$ Temp: 98.3 F (36.8 C) 98.5 F (36.9 C) 98.5 F (36.9 C) 98.2 F (36.8 C)  TempSrc: Oral Oral Oral Oral  SpO2: 100% 99% 99% 98%  Weight:      Height:       CBC:  Recent Labs  Lab 05/19/21 1950 05/19/21 2044 05/21/21 0257  WBC 9.7  --  8.9  NEUTROABS 6.1  --   --   HGB 11.0* 10.9* 10.0*  HCT 32.1* 32.0* 28.8*  MCV 85.4  --  83.7  PLT 280  --  [id number]   Basic Metabolic Panel:  Recent Labs  Lab 05/19/21 1950 05/19/21 2044 05/21/21 0257  NA 132* 136 135  K 4.6 4.7 4.5  CL 103 106 104  CO2 20*  --  21*  GLUCOSE 174* 173* 208*  BUN 44* 43* 45*  CREATININE 4.60* 5.00* 4.40*  CALCIUM 8.9  --  8.9  PHOS  --   --  4.5   Lipid Panel:  Recent Labs  Lab 05/21/21 0257  CHOL 271*  TRIG 456*  HDL 31*  CHOLHDL 8.7  VLDL UNABLE TO CALCULATE IF TRIGLYCERIDE OVER 400 mg/dL  LDLCALC UNABLE TO CALCULATE IF TRIGLYCERIDE OVER 400 mg/dL   HgbA1c:  Recent Labs  Lab 05/20/21 0527  HGBA1C 7.4*   Urine Drug Screen:  Recent Labs  Lab 05/19/21 1950  LABOPIA NONE DETECTED  COCAINSCRNUR NONE DETECTED  LABBENZ NONE DETECTED  AMPHETMU NONE DETECTED  THCU NONE DETECTED  LABBARB NONE DETECTED    Alcohol Level  Recent Labs  Lab 05/19/21 1950  ETH <10    IMAGING past 24 hours ECHOCARDIOGRAM COMPLETE  Result Date: 05/20/2021    ECHOCARDIOGRAM REPORT   Patient Name:   Bruce Little Date of Exam: 05/20/2021 Medical Rec #:  [id number]    Height:       74.0 in Accession #:    [id number]   Weight:       250.0 lb Date of Birth:  04/07/1971    BSA:          2.390 m Patient Age:    50 years      BP:           183/119 mmHg Patient Gender: M            HR:           94 bpm. Exam Location:  Inpatient Procedure: 2D Echo, Color Doppler, Cardiac Doppler and Intracardiac            Opacification Agent Indications:    Stroke I63.9  History:        Patient has no prior history of Echocardiogram examinations.                 Risk Factors:Diabetes and Hypertension.  Sonographer:    Bernadene Person RDCS Referring Phys: [place]  1. Left ventricular ejection fraction, by estimation, is 60 to 65%. The left ventricle has normal function. The left ventricle has no regional wall motion abnormalities. There is  mild concentric left ventricular hypertrophy. Left ventricular diastolic function could not be evaluated.  2. Right ventricular systolic function is normal. The right ventricular size is normal. Tricuspid regurgitation signal is inadequate for assessing PA pressure.  3. Left atrial size was mildly dilated.  4. The mitral valve is normal in structure. Trivial mitral valve regurgitation. No evidence of mitral stenosis.  5. The aortic valve is normal in structure. Aortic valve regurgitation is not visualized. No aortic stenosis is present.  6. The inferior vena cava is normal in size with <50% respiratory variability, suggesting right atrial pressure of 8 mmHg. FINDINGS  Left Ventricle: Left ventricular ejection fraction, by estimation, is 60 to 65%. The left ventricle has normal function. The left ventricle has no regional wall motion abnormalities. Definity contrast agent was given IV to delineate the left ventricular  endocardial borders. The left ventricular internal cavity size was normal in size. There is mild concentric left ventricular hypertrophy. Left ventricular diastolic function could not be evaluated. Right Ventricle: The right ventricular size is normal. No increase in right ventricular wall thickness. Right ventricular systolic function is normal. Tricuspid regurgitation signal is  inadequate for assessing PA pressure. Left Atrium: Left atrial size was mildly dilated. Right Atrium: Right atrial size was normal in size. Pericardium: There is no evidence of pericardial effusion. Mitral Valve: The mitral valve is normal in structure. Trivial mitral valve regurgitation. No evidence of mitral valve stenosis. Tricuspid Valve: The tricuspid valve is normal in structure. Tricuspid valve regurgitation is trivial. No evidence of tricuspid stenosis. Aortic Valve: The aortic valve is normal in structure. Aortic valve regurgitation is not visualized. No aortic stenosis is present. Pulmonic Valve: The pulmonic valve was normal in structure. Pulmonic valve regurgitation is not visualized. No evidence of pulmonic stenosis. Aorta: The aortic root is normal in size and structure. Venous: The inferior vena cava is normal in size with less than 50% respiratory variability, suggesting right atrial pressure of 8 mmHg. IAS/Shunts: No atrial level shunt detected by color flow Doppler.  LEFT VENTRICLE PLAX 2D LVIDd:         5.30 cm  Diastology LVIDs:         3.90 cm  LV e' lateral: 8.76 cm/s LV PW:         1.20 cm LV IVS:        1.20 cm LVOT diam:     2.30 cm LV SV:         80 LV SV Index:   34 LVOT Area:     4.15 cm  RIGHT VENTRICLE RV S prime:     10.40 cm/s TAPSE (M-mode): 2.1 cm LEFT ATRIUM             Index       RIGHT ATRIUM           Index LA diam:        4.70 cm 1.97 cm/m  RA Area:     13.00 cm LA Vol (A2C):   85.5 ml 35.78 ml/m RA Volume:   26.10 ml  10.92 ml/m LA Vol (A4C):   89.6 ml 37.49 ml/m LA Biplane Vol: 93.3 ml 39.04 ml/m  AORTIC VALVE LVOT Vmax:   95.60 cm/s LVOT Vmean:  67.000 cm/s LVOT VTI:    0.193 m  AORTA Ao Root diam: 3.40 cm Ao Asc diam:  3.50 cm  SHUNTS Systemic VTI:  0.19 m Systemic Diam: 2.30 cm Fransico Him MD Electronically signed by Fransico Him MD Signature Date/Time:  05/20/2021/11:42:26 AM    Final    VAS US CAROTID (at [facility] and [facility] only)  Result Date: 05/20/2021 Carotid  Arterial Duplex Study Patient Name:  Bruce Little  Date of Exam:   05/20/2021 Medical Rec #: [id number]     Accession #:    [id number] Date of Birth: [birth year]     Patient Gender: M Patient Age:   66Y Exam Location:  [facility] Procedure:      VAS US CAROTID Referring Phys: [id number] [employer] [facility] --------------------------------------------------------------------------------  Indications:       CVA. Risk Factors:      Hyperlipidemia, Diabetes. Comparison Study:  no prior Performing Technologist: Archie Patten RVS  Examination Guidelines: A complete evaluation includes B-mode imaging, spectral Doppler, color Doppler, and power Doppler as needed of all accessible portions of each vessel. Bilateral testing is considered an integral part of a complete examination. Limited examinations for reoccurring indications may be performed as noted.  Right Carotid Findings: +----------+--------+--------+--------+------------------+--------+           PSV cm/sEDV cm/sStenosisPlaque DescriptionComments +----------+--------+--------+--------+------------------+--------+ CCA Prox  54      10              heterogenous               +----------+--------+--------+--------+------------------+--------+ CCA Distal71      16              heterogenous               +----------+--------+--------+--------+------------------+--------+ ICA Prox  126     51      40-59%  heterogenous               +----------+--------+--------+--------+------------------+--------+ ICA Mid   108     42                                         +----------+--------+--------+--------+------------------+--------+ ICA Distal90      35                                         +----------+--------+--------+--------+------------------+--------+ ECA       151                                                +----------+--------+--------+--------+------------------+--------+  +----------+--------+-------+--------+-------------------+           PSV cm/sEDV cmsDescribeArm Pressure (mmHG) +----------+--------+-------+--------+-------------------+ [id number]                                         +----------+--------+-------+--------+-------------------+ +---------+--------+--+--------+--+---------+ VertebralPSV cm/s38EDV cm/s10Antegrade +---------+--------+--+--------+--+---------+  Left Carotid Findings: +----------+--------+--------+--------+------------------+--------+           PSV cm/sEDV cm/sStenosisPlaque DescriptionComments +----------+--------+--------+--------+------------------+--------+ CCA Prox  74      10              heterogenous               +----------+--------+--------+--------+------------------+--------+ CCA Distal50      12              heterogenous               +----------+--------+--------+--------+------------------+--------+  ICA Prox  70      22      1-39%   heterogenous               +----------+--------+--------+--------+------------------+--------+ ICA Distal81      29                                         +----------+--------+--------+--------+------------------+--------+ ECA       113     8                                          +----------+--------+--------+--------+------------------+--------+ +----------+--------+--------+--------+-------------------+           PSV cm/sEDV cm/sDescribeArm Pressure (mmHG) +----------+--------+--------+--------+-------------------+ [id number]                                          +----------+--------+--------+--------+-------------------+ +---------+--------+--+--------+--+---------+ VertebralPSV cm/s29EDV cm/s10Antegrade +---------+--------+--+--------+--+---------+   Summary: Right Carotid: Velocities in the right ICA are consistent with a 40-59%                stenosis. Left Carotid: Velocities in the left ICA are consistent with a  1-39% stenosis. Vertebrals: Bilateral vertebral arteries demonstrate antegrade flow. *See table(s) above for measurements and observations.     Preliminary     PHYSICAL EXAM General: Appears well-developed; no acute distress. Psych: Affect appropriate to situation Eyes: No scleral injection HENT: No OP obstrucion Head: Normocephalic.  Cardiovascular: Normal rate and regular rhythm.  Respiratory: Effort normal and breath sounds normal to anterior ascultation GI: Soft.  No distension. There is no tenderness.  Skin: WDI    Neurological Examination Mental Status: Alert, oriented, thought content appropriate.  Speech fluent without evidence of aphasia. Able to follow 3 step commands without difficulty. Cranial Nerves: II: Visual fields grossly normal,  III,IV, VI: ptosis seen in left eye slightly. Extra-ocular motions intact bilaterally, pupils equal, round, reactive to light and accommodation V,VII: smile symmetric, but some flattening on left nasolabial fold noted. facial light touch sensation normal bilaterally VIII: hearing normal bilaterally IX,X: uvula rises symmetrically XI: bilateral shoulder shrug XII: midline tongue extension Motor: Right : Upper extremity   5/5    Left:     Upper extremity   5/5  Lower extremity   5/5     Lower extremity   5/5 Tone and bulk:normal tone throughout; no atrophy noted Sensory: Pinprick and light touch intact throughout, bilaterally Deep Tendon Reflexes: 2+ and symmetric throughout Plantars: Right: downgoing   Left: downgoing Cerebellar: normal finger-to-nose, normal rapid alternating movements and normal heel-to-shin test Gait: normal gait and station   ASSESSMENT/PLAN Bruce Little is a 50 y.o. male with history of DM2, HTN, presenting with Left side weakness and numbness since Saturday. Out of time window for any acute stroke tx's.   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  2D Echo  LDL UNABLE TO CALCULATE IF TRIGLYCERIDE OVER 400 mg/dL HgbA1c 7.4 VTE prophylaxis - heparin Wink    Diet   Diet Carb Modified Fluid consistency: Thin; Room service appropriate? Yes   aspirin 81 mg daily prior to admission, now on aspirin 81 mg daily and clopidogrel 75 mg daily. For 3 weeks, then monotherapy with Plavix alone since he failed ASA alone.  Therapy recommendations:  pending Disposition:  pending  Hypertension Home meds:  Hydroxyzine, cozaar Stable Permissive hypertension (OK if < 220/120) but gradually normalize in 5-7 days Long-term BP goal normotensive  Hyperlipidemia Home meds:  none LDL UNABLE TO CALCULATE IF TRIGLYCERIDE OVER 400 mg/dL, goal < 70 Add Lipitor '80mg'$  + Finofibrate '160mg'$   High intensity statin  Continue statin at discharge  Diabetes type II Controlled Home meds:  Glucovance, Victoza HgbA1c 7.4, goal < 7.0 CBGs Recent Labs    05/20/21 1848 05/20/21 2140 05/21/21 0604  GLUCAP 224* 199* 160*    SSI  Other Stroke Risk Factors Obesity, Body mass index is 32.1 kg/m., BMI >/= 30 associated with increased stroke risk, recommend weight loss, diet and exercise as appropriate  Hx stroke/TIA Family hx stroke    Other Active Problems Insomnia- ok to continue home Ambien GERD- ok to continue home PPI AKI? Cr as hight as 5; Nephrology consulted  Hospital day # 1  Desiree Metzger-Cihelka, ARNP-C, ANVP-BC Pager: [phone]   ATTENDING NOTE: I reviewed above note and agree with the assessment and plan. Pt was seen and examined.   50 year old male with history of diabetes, hypertension admitted for left facial droop, left-sided weakness and slurred speech.   Currently symptoms resolved.  MRI showed right BG/CR infarct, but also old right thalamus infarct and right BG ICH.  MRA head showed left P2 mild stenosis.  Carotid Doppler right ICA 40 to 59% stenosis.  EF 60 to 65%.  LDL 122.9, TG 456, A1c 7.4.  However, patient was found to have severe elevated creatinine, 5.0-4.4.  Also continue to have significantly elevated BP.  On exam, patient neurologically intact, no focal deficit.  Etiology patient stroke likely due to vessel disease.  Risk factor including uncontrolled diabetes, hypertension, hyperlipidemia and severe CKD.  Nephrology on board, recommend renal biopsy by IR team.  However, patient eager to go home, will do outpatient renal biopsy.  Per IR team, patient will be off aspirin and Plavix for 5 days prior to biopsy, and able to resume aspirin and Plavix 2 days after biopsy.  That will be arranged as outpatient.  Regarding patient right ICA 40 to 59% stenosis on carotid Doppler, ideally will recommend CT head and neck to further evaluation, however patient elevated creatinine, will recommend follow-up as outpatient with neurology and vascular surgery to monitor carotid stenosis.  Continue Lipitor 80 on discharge.  For detailed assessment and plan, please refer to above as I have made changes wherever appropriate.   Neurology will sign off. Please call with questions. Pt will follow up with stroke clinic NP at [facility] in about 4 weeks. Thanks for the consult.   Rosalin Hawking, MD PhD Stroke Neurology 05/21/2021 8:44 PM    To contact Stroke Continuity provider, please refer to [email or web address] After hours, contact General Neurology

## 2021-05-21 NOTE — Plan of Care (Signed)
  Problem: Education: Goal: Knowledge of disease or condition will improve Outcome: Adequate for Discharge Goal: Knowledge of secondary prevention will improve Outcome: Adequate for Discharge Goal: Knowledge of patient specific risk factors addressed and post discharge goals established will improve Outcome: Adequate for Discharge Goal: Individualized Educational Video(s) Outcome: Adequate for Discharge   Problem: Health Behavior/Discharge Planning: Goal: Ability to manage health-related needs will improve Outcome: Adequate for Discharge   Problem: Health Behavior/Discharge Planning: Goal: Ability to manage health-related needs will improve Outcome: Adequate for Discharge   Problem: Clinical Measurements: Goal: Ability to maintain clinical measurements within normal limits will improve Outcome: Adequate for Discharge Goal: Will remain free from infection Outcome: Adequate for Discharge Goal: Diagnostic test results will improve Outcome: Adequate for Discharge Goal: Respiratory complications will improve Outcome: Adequate for Discharge Goal: Cardiovascular complication will be avoided Outcome: Adequate for Discharge   Problem: Skin Integrity: Goal: Risk for impaired skin integrity will decrease Outcome: Adequate for Discharge

## 2021-05-21 NOTE — Progress Notes (Signed)
Renal artery duplex completed. Refer to "CV Proc" under chart review to view preliminary results.  05/21/2021 2:18 PM Kelby Aline., MHA, RVT, RDCS, RDMS

## 2021-05-22 ENCOUNTER — Telehealth (HOSPITAL_COMMUNITY): Payer: Self-pay

## 2021-05-22 LAB — HEPATITIS B SURFACE ANTIBODY, QUANTITATIVE: Hep B S AB Quant (Post): <3.1 m[IU]/mL — ABNORMAL LOW (ref >9.9)

## 2021-05-22 LAB — HCV AB W REFLEX TO QUANT PCR: HCV Ab: 0.1 s/co ratio (ref 0.0–0.9)

## 2021-05-22 LAB — HCV INTERPRETATION

## 2021-05-22 NOTE — ED Notes (Signed)
Pt seen drinking water from home, no difficulty noted w swallowing

## 2021-05-22 NOTE — Telephone Encounter (Signed)
Called to schedule renal biopsy, no answer, left vm. AW

## 2021-05-23 LAB — MISC LABCORP TEST (SEND OUT): Labcorp test code: 141330

## 2021-05-23 LAB — PROTEIN ELECTROPHORESIS, SERUM
A/G Ratio: 1 (ref 0.7–1.7)
Albumin ELP: 3.1 g/dL (ref 2.9–4.4)
Alpha-1-Globulin: 0.2 g/dL (ref 0.0–0.4)
Alpha-2-Globulin: 1.2 g/dL — ABNORMAL HIGH (ref 0.4–1.0)
Beta Globulin: 0.8 g/dL (ref 0.7–1.3)
Gamma Globulin: 1.1 g/dL (ref 0.4–1.8)
Globulin, Total: 3.2 g/dL (ref 2.2–3.9)
M-Spike, %: 0.3 g/dL — ABNORMAL HIGH
Total Protein ELP: 6.3 g/dL (ref 6.0–8.5)

## 2021-06-03 ENCOUNTER — Other Ambulatory Visit: Payer: Self-pay | Admitting: Radiology

## 2021-06-03 ENCOUNTER — Other Ambulatory Visit: Payer: Self-pay | Admitting: Student

## 2021-06-03 DIAGNOSIS — R809 Proteinuria, unspecified: Secondary | ICD-10-CM

## 2021-06-03 NOTE — H&P (Signed)
Chief Complaint: Patient was seen in consultation today for image guided random renal biopsy at the request of Dr. Vale Haven. L.    Referring Physician(s): Dr. Vale Haven. Bruce Little    Supervising Physician: Sandi Mariscal  Patient Status: [facility] - In-pt  History of Present Illness: Bruce Little is a 50 y.o. male with PMH of HTN and T2DM, who was recently hospitalized from 05/20/21 to 05/21/21 due to subacute CVA and renal insufficiency. Renal function test showed BUN 44, Creatinine 4.6, and GFR 15 on 05/19/21.  Patient underwent US renal on 05/20/21 which showed normal kidneys and urinay bladder. Vascular US for renal artery showed no renal artery stenosis bilaterally. Patient was ultimately discharged in sable condition with outpatient follow up with PCP, nephrology, neurology, vascular surgery and IR.   IR was requested for image guided random renal biopsy.   Patient laying in bed, not in acute distress.  Denise headache, fever, chills, shortness of breath, cough, chest pain, abdominal pain, nausea ,vomiting, and bleeding.   Past Medical History:  Diagnosis Date   DM2 (diabetes mellitus, type 2) ([place])    HTN (hypertension)     History reviewed. No pertinent surgical history.  Allergies: Patient has no known allergies.  Medications: Prior to Admission medications   Medication Sig Start Date End Date Taking? Authorizing Provider  acetaminophen (TYLENOL) 325 MG tablet Take 2 tablets (650 mg total) by mouth every 4 (four) hours as needed for mild pain (or temp > 37.5 C (99.5 F)). 05/21/21  Yes Sheikh, Omair Latif, DO  amLODipine (NORVASC) 10 MG tablet Take 1 tablet (10 mg total) by mouth daily. 05/21/21 06/20/21 Yes Sheikh, Omair Latif, DO  atorvastatin (LIPITOR) 80 MG tablet Take 1 tablet (80 mg total) by mouth daily. 05/22/21  Yes Sheikh, Omair Latif, DO  clopidogrel (PLAVIX) 75 MG tablet Take 1 tablet (75 mg total) by mouth daily. 05/22/21  Yes Sheikh, Omair Latif, DO  fenofibrate 160 MG tablet  Take 1 tablet (160 mg total) by mouth daily. 05/21/21  Yes Sheikh, Omair Latif, DO  glipiZIDE (GLUCOTROL) 5 MG tablet Take 1 tablet (5 mg total) by mouth daily. 05/21/21 05/21/22 Yes Sheikh, Omair Latif, DO  hydrocortisone cream 1 % Apply 1 application topically 2 (two) times daily as needed for itching (rash).   Yes [provider]  hydrOXYzine (ATARAX/VISTARIL) 25 MG tablet Take 12.5-25 mg by mouth daily as needed for anxiety. 03/30/21  Yes [provider]  loratadine (CLARITIN) 10 MG tablet Take 10 mg by mouth daily as needed for allergies.   Yes [provider]  multivitamin (ONE-A-DAY MEN'S) TABS tablet Take 1 tablet by mouth daily.   Yes [provider]  pantoprazole (PROTONIX) 40 MG tablet Take 1 tablet (40 mg total) by mouth daily. 05/22/21  Yes Sheikh, Omair Latif, DO  VICTOZA 18 MG/3ML SOPN Inject 1.8 mg into the skin daily. 05/07/21  Yes [provider]  zolpidem (AMBIEN) 10 MG tablet Take 5-10 mg by mouth at bedtime as needed for sleep. 11/28/20  Yes [provider]     Family History  Problem Relation Age of Onset   Stroke Mother     Social History   Socioeconomic History   Marital status: Unknown    Spouse name: Not on file   Number of children: Not on file   Years of education: Not on file   Highest education level: Not on file  Occupational History   Not on file  Tobacco Use   Smoking  status: Never   Smokeless tobacco: Not on file  Substance and Sexual Activity   Alcohol use: Yes    Comment: occ   Drug use: Never   Sexual activity: Not on file  Other Topics Concern   Not on file  Social History Narrative   Not on file   Social Determinants of Health   Financial Resource Strain: Not on file  Food Insecurity: Not on file  Transportation Needs: Not on file  Physical Activity: Not on file  Stress: Not on file  Social Connections: Not on file     Review of Systems: A 12 point ROS discussed and pertinent  positives are indicated in the HPI above.  All other systems are negative.   Vital Signs: BP (!) 156/88   Pulse 99   Temp 98.6 F (37 C) (Oral)   Ht '6\' 2"'$  (1.88 m)   Wt 250 lb (113.4 kg)   SpO2 100%   BMI 32.10 kg/m   Physical Exam Vitals reviewed.  Constitutional:      General: He is not in acute distress.    Appearance: Normal appearance. He is not ill-appearing.  HENT:     Head: Normocephalic and atraumatic.     Mouth/Throat:     Mouth: Mucous membranes are moist.  Cardiovascular:     Rate and Rhythm: Normal rate and regular rhythm.     Pulses: Normal pulses.     Heart sounds: Normal heart sounds.  Pulmonary:     Effort: Pulmonary effort is normal.     Breath sounds: Normal breath sounds.  Abdominal:     General: Abdomen is flat.     Palpations: Abdomen is soft.  Musculoskeletal:        General: No swelling.     Cervical back: Neck supple.  Skin:    General: Skin is warm and dry.     Coloration: Skin is not jaundiced or pale.  Neurological:     Mental Status: He is alert and oriented to person, place, and time.  Psychiatric:        Mood and Affect: Mood normal.        Behavior: Behavior normal.        Judgment: Judgment normal.    MD Evaluation Airway: WNL Heart: WNL Abdomen: WNL Chest/ Lungs: WNL ASA  Classification: 2 Mallampati/Airway Score: One  Imaging: MR ANGIO HEAD WO CONTRAST  Result Date: 05/20/2021 CLINICAL DATA:  Left facial droop EXAM: MRI HEAD WITHOUT CONTRAST MRA HEAD WITHOUT CONTRAST TECHNIQUE: Multiplanar, multi-echo pulse sequences of the brain and surrounding structures were acquired without intravenous contrast. Angiographic images of the Circle of Willis were acquired using MRA technique without intravenous contrast. COMPARISON:  No pertinent prior exam. FINDINGS: MRI HEAD FINDINGS Brain: Small acute/early subacute infarct the right caudate body. 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 There is  multifocal hyperintense T2-weighted signal within the white matter. Parenchymal volume and CSF spaces are normal. 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 The midline structures are normal. Vascular: Major flow voids are preserved. Skull and upper cervical spine: Normal calvarium and skull base. Visualized upper cervical spine and soft tissues are normal. Sinuses/Orbits:No paranasal sinus fluid levels or advanced mucosal thickening. No mastoid or middle ear effusion. Normal orbits. MRA HEAD FINDINGS POSTERIOR CIRCULATION: --Vertebral arteries: Normal --Inferior cerebellar arteries: Normal. --Basilar artery: Normal. --Superior cerebellar arteries: Normal. --Posterior cerebral arteries: Mild stenosis of the proximal left P2 segment. Normal right PCA. ANTERIOR CIRCULATION: --  Intracranial internal carotid arteries: Normal. --Anterior cerebral arteries (ACA): Normal. --Middle cerebral arteries (MCA): Normal. ANATOMIC VARIANTS: None IMPRESSION: 1. Small acute/early subacute infarct of the right caudate body. No hemorrhage or mass effect. 2. 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 3. Mild stenosis of the proximal left P2 segment. Electronically Signed   By: Ulyses Jarred M.D.   On: 05/20/2021 00:08   MR BRAIN WO CONTRAST  Result Date: 05/20/2021 CLINICAL DATA:  Left facial droop EXAM: MRI HEAD WITHOUT CONTRAST MRA HEAD WITHOUT CONTRAST TECHNIQUE: Multiplanar, multi-echo pulse sequences of the brain and surrounding structures were acquired without intravenous contrast. Angiographic images of the Circle of Willis were acquired using MRA technique without intravenous contrast. COMPARISON:  No pertinent prior exam. FINDINGS: MRI HEAD FINDINGS Brain: Small acute/early subacute infarct the right caudate body. 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 There is multifocal hyperintense T2-weighted signal within the white matter. Parenchymal volume and CSF spaces are  normal. 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 The midline structures are normal. Vascular: Major flow voids are preserved. Skull and upper cervical spine: Normal calvarium and skull base. Visualized upper cervical spine and soft tissues are normal. Sinuses/Orbits:No paranasal sinus fluid levels or advanced mucosal thickening. No mastoid or middle ear effusion. Normal orbits. MRA HEAD FINDINGS POSTERIOR CIRCULATION: --Vertebral arteries: Normal --Inferior cerebellar arteries: Normal. --Basilar artery: Normal. --Superior cerebellar arteries: Normal. --Posterior cerebral arteries: Mild stenosis of the proximal left P2 segment. Normal right PCA. ANTERIOR CIRCULATION: --Intracranial internal carotid arteries: Normal. --Anterior cerebral arteries (ACA): Normal. --Middle cerebral arteries (MCA): Normal. ANATOMIC VARIANTS: None IMPRESSION: 1. Small acute/early subacute infarct of the right caudate body. No hemorrhage or mass effect. 2. 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 3. Mild stenosis of the proximal left P2 segment. Electronically Signed   By: Ulyses Jarred M.D.   On: 05/20/2021 00:08   US RENAL  Result Date: 05/20/2021 CLINICAL DATA:  50 year old male with renal insufficiency. Diabetes and hypertension. EXAM: RENAL / URINARY TRACT ULTRASOUND COMPLETE COMPARISON:  None. FINDINGS: Right Kidney: Renal measurements: 13.4 x 5.3 x 5.4 cm = volume: 202 mL. Echogenicity within normal limits. No mass or hydronephrosis visualized. Left Kidney: Renal measurements: 13.5 x 7.5 x 7.1 cm = volume: 375 mL. Echogenicity within normal limits. No mass or hydronephrosis visualized. Bladder: Appears normal for degree of bladder distention. Both ureteral jets detected with Doppler. Other: Echogenic liver (image 2). IMPRESSION: 1. Normal ultrasound appearance of both kidneys and the urinary bladder. 2. Hepatic steatosis. Electronically Signed   By: Genevie Ann M.D.   On: 05/20/2021 06:45   ECHOCARDIOGRAM  COMPLETE  Result Date: 05/20/2021    ECHOCARDIOGRAM REPORT   Patient Name:   MADDEN CAVIN Donn Date of Exam: 05/20/2021 Medical Rec #:  [id number]    Height:       74.0 in Accession #:    [id number]   Weight:       250.0 lb Date of Birth:  [birth year]    BSA:          2.390 m Patient Age:    41 years     BP:           183/119 mmHg Patient Gender: M            HR:           94 bpm. Exam Location:  Inpatient Procedure: 2D Echo, Color Doppler, Cardiac Doppler and Intracardiac  Opacification Agent Indications:    Stroke I63.9  History:        Patient has no prior history of Echocardiogram examinations.                 Risk Factors:Diabetes and Hypertension.  Sonographer:    Bernadene Person RDCS Referring Phys: [place]  1. Left ventricular ejection fraction, by estimation, is 60 to 65%. The left ventricle has normal function. The left ventricle has no regional wall motion abnormalities. There is mild concentric left ventricular hypertrophy. Left ventricular diastolic function could not be evaluated.  2. Right ventricular systolic function is normal. The right ventricular size is normal. Tricuspid regurgitation signal is inadequate for assessing PA pressure.  3. Left atrial size was mildly dilated.  4. The mitral valve is normal in structure. Trivial mitral valve regurgitation. No evidence of mitral stenosis.  5. The aortic valve is normal in structure. Aortic valve regurgitation is not visualized. No aortic stenosis is present.  6. The inferior vena cava is normal in size with <50% respiratory variability, suggesting right atrial pressure of 8 mmHg. FINDINGS  Left Ventricle: Left ventricular ejection fraction, by estimation, is 60 to 65%. The left ventricle has normal function. The left ventricle has no regional wall motion abnormalities. Definity contrast agent was given IV to delineate the left ventricular  endocardial borders. The left ventricular internal cavity size was normal in size.  There is mild concentric left ventricular hypertrophy. Left ventricular diastolic function could not be evaluated. Right Ventricle: The right ventricular size is normal. No increase in right ventricular wall thickness. Right ventricular systolic function is normal. Tricuspid regurgitation signal is inadequate for assessing PA pressure. Left Atrium: Left atrial size was mildly dilated. Right Atrium: Right atrial size was normal in size. Pericardium: There is no evidence of pericardial effusion. Mitral Valve: The mitral valve is normal in structure. Trivial mitral valve regurgitation. No evidence of mitral valve stenosis. Tricuspid Valve: The tricuspid valve is normal in structure. Tricuspid valve regurgitation is trivial. No evidence of tricuspid stenosis. Aortic Valve: The aortic valve is normal in structure. Aortic valve regurgitation is not visualized. No aortic stenosis is present. Pulmonic Valve: The pulmonic valve was normal in structure. Pulmonic valve regurgitation is not visualized. No evidence of pulmonic stenosis. Aorta: The aortic root is normal in size and structure. Venous: The inferior vena cava is normal in size with less than 50% respiratory variability, suggesting right atrial pressure of 8 mmHg. IAS/Shunts: No atrial level shunt detected by color flow Doppler.  LEFT VENTRICLE PLAX 2D LVIDd:         5.30 cm  Diastology LVIDs:         3.90 cm  LV e' lateral: 8.76 cm/s LV PW:         1.20 cm LV IVS:        1.20 cm LVOT diam:     2.30 cm LV SV:         80 LV SV Index:   34 LVOT Area:     4.15 cm  RIGHT VENTRICLE RV S prime:     10.40 cm/s TAPSE (M-mode): 2.1 cm LEFT ATRIUM             Index       RIGHT ATRIUM           Index LA diam:        4.70 cm 1.97 cm/m  RA Area:     13.00 cm LA  Vol [facility]):   85.5 ml 35.78 ml/m RA Volume:   26.10 ml  10.92 ml/m LA Vol (A4C):   89.6 ml 37.49 ml/m LA Biplane Vol: 93.3 ml 39.04 ml/m  AORTIC VALVE LVOT Vmax:   95.60 cm/s LVOT Vmean:  67.000 cm/s LVOT VTI:     0.193 m  AORTA Ao Root diam: 3.40 cm Ao Asc diam:  3.50 cm  SHUNTS Systemic VTI:  0.19 m Systemic Diam: 2.30 cm Fransico Him MD Electronically signed by Fransico Him MD Signature Date/Time: 05/20/2021/11:42:26 AM    Final    VAS US CAROTID (at [facility] and [facility] only)  Result Date: 05/22/2021 Carotid Arterial Duplex Study Patient Name:  TIMMIE DINSDALE  Date of Exam:   05/20/2021 Medical Rec #: [id number]     Accession #:    [id number] Date of Birth: June 01, 1971     Patient Gender: M Patient Age:   10Y Exam Location:  [facility] Procedure:      VAS US CAROTID Referring Phys: [id number] [employer] [facility] --------------------------------------------------------------------------------  Indications:       CVA. Risk Factors:      Hyperlipidemia, Diabetes. Comparison Study:  no prior Performing Technologist: Archie Patten RVS  Examination Guidelines: A complete evaluation includes B-mode imaging, spectral Doppler, color Doppler, and power Doppler as needed of all accessible portions of each vessel. Bilateral testing is considered an integral part of a complete examination. Limited examinations for reoccurring indications may be performed as noted.  Right Carotid Findings: +----------+--------+--------+--------+------------------+--------+           PSV cm/sEDV cm/sStenosisPlaque DescriptionComments +----------+--------+--------+--------+------------------+--------+ CCA Prox  1       0               heterogenous               +----------+--------+--------+--------+------------------+--------+ CCA Distal1       0               heterogenous               +----------+--------+--------+--------+------------------+--------+ ICA Prox  1       1       40-59%  heterogenous               +----------+--------+--------+--------+------------------+--------+ ICA Mid   1       0                                          +----------+--------+--------+--------+------------------+--------+ ICA Distal1       0                                           +----------+--------+--------+--------+------------------+--------+ ECA       2                                                  +----------+--------+--------+--------+------------------+--------+ +----------+--------+-------+--------+-------------------+           PSV cm/sEDV cmsDescribeArm Pressure (mmHG) +----------+--------+-------+--------+-------------------+ Subclavian1                                          +----------+--------+-------+--------+-------------------+ +---------+--------+-+--------+-+---------+  VertebralPSV cm/s0EDV cm/s0Antegrade +---------+--------+-+--------+-+---------+  Left Carotid Findings: +----------+--------+--------+--------+------------------+--------+           PSV cm/sEDV cm/sStenosisPlaque DescriptionComments +----------+--------+--------+--------+------------------+--------+ CCA Prox  1       0               heterogenous               +----------+--------+--------+--------+------------------+--------+ CCA Distal0       0               heterogenous               +----------+--------+--------+--------+------------------+--------+ ICA Prox  1       0       1-39%   heterogenous               +----------+--------+--------+--------+------------------+--------+ ICA Distal1       0                                          +----------+--------+--------+--------+------------------+--------+ ECA       1       0                                          +----------+--------+--------+--------+------------------+--------+ +----------+--------+--------+--------+-------------------+           PSV cm/sEDV cm/sDescribeArm Pressure (mmHG) +----------+--------+--------+--------+-------------------+ Subclavian1                                           +----------+--------+--------+--------+-------------------+ +---------+--------+-+--------+-+---------+ VertebralPSV  cm/s0EDV cm/s0Antegrade +---------+--------+-+--------+-+---------+   Summary: Right Carotid: Velocities in the right ICA are consistent with a 40-59%                stenosis. Left Carotid: Velocities in the left ICA are consistent with a 1-39% stenosis. Vertebrals: Bilateral vertebral arteries demonstrate antegrade flow. *See table(s) above for measurements and observations.  Electronically signed by Antony Contras MD on 05/22/2021 at 8:16:44 AM.    Final    VAS US RENAL ARTERY DUPLEX  Result Date: 05/21/2021 ABDOMINAL VISCERAL Patient Name:  ROYD DEWING  Date of Exam:   05/21/2021 Medical Rec #: [id number]     Accession #:    [id number] Date of Birth: March 03, 1971     Patient Gender: M Patient Age:   16Y Exam Location:  [facility] Procedure:      VAS US RENAL ARTERY DUPLEX Referring Phys: [id number] Georgina Quint LATIF [facility] -------------------------------------------------------------------------------- Indications: Hypertension High Risk Factors: Diabetes. Limitations: Air/bowel gas. Comparison Study: No prior study Performing Technologist: Maudry Mayhew MHA, RDMS, RVT, RDCS  Examination Guidelines: A complete evaluation includes B-mode imaging, spectral Doppler, color Doppler, and power Doppler as needed of all accessible portions of each vessel. Bilateral testing is considered an integral part of a complete examination. Limited examinations for reoccurring indications may be performed as noted.  Duplex Findings: +--------------------+--------+--------+------+--------+ Mesenteric          PSV cm/sEDV cm/sPlaqueComments +--------------------+--------+--------+------+--------+ Aorta Prox             91                          +--------------------+--------+--------+------+--------+ Celiac Artery Origin  338      94                  +--------------------+--------+--------+------+--------+ SMA Proximal          235      24                   +--------------------+--------+--------+------+--------+    +------------------+--------+--------+-------+ Right Renal ArteryPSV cm/sEDV cm/sComment +------------------+--------+--------+-------+ Origin              103      15           +------------------+--------+--------+-------+ Proximal             69      9            +------------------+--------+--------+-------+ Mid                  43      9            +------------------+--------+--------+-------+ Distal               42      10           +------------------+--------+--------+-------+ +-----------------+--------+--------+-------+ Left Renal ArteryPSV cm/sEDV cm/sComment +-----------------+--------+--------+-------+ Origin             106      27           +-----------------+--------+--------+-------+ Proximal            17      5            +-----------------+--------+--------+-------+ Mid                 23      9            +-----------------+--------+--------+-------+ Distal              20      5            +-----------------+--------+--------+-------+ +------------+--------+--------+----+-----------+--------+--------+----+ Right KidneyPSV cm/sEDV cm/sRI  Left KidneyPSV cm/sEDV cm/sRI   +------------+--------+--------+----+-----------+--------+--------+----+ Upper Pole  20      8       0.62Upper Pole 34      6       0.82 +------------+--------+--------+----+-----------+--------+--------+----+ Mid         38      7       0.82Mid        21      5       0.78 +------------+--------+--------+----+-----------+--------+--------+----+ Lower Pole  68      8       0.88Lower Pole 27      5       0.82 +------------+--------+--------+----+-----------+--------+--------+----+ Hilar       21      5       0.78Hilar      24      6       0.75 +------------+--------+--------+----+-----------+--------+--------+----+ +------------------+---------+------------------+---------+  Right Kidney               Left Kidney                 +------------------+---------+------------------+---------+ RAR                        RAR                         +------------------+---------+------------------+---------+ RAR (manual)  1.13     RAR (manual)      1.16      +------------------+---------+------------------+---------+ Cortex            10/5 cm/sCortex            17/4 cm/s +------------------+---------+------------------+---------+ Cortex thickness           Corex thickness             +------------------+---------+------------------+---------+ Kidney length (cm)12.60    Kidney length (cm)12.80     +------------------+---------+------------------+---------+  Summary: Renal:  Right: No evidence of right renal artery stenosis. RRV flow present. Left:  No evidence of left renal artery stenosis. LRV flow present. Mesenteric: Normal Superior Mesenteric artery findings. 70 to 99% stenosis in the celiac artery.  *See table(s) above for measurements and observations.  Diagnosing physician: Jamelle Haring  Electronically signed by Jamelle Haring on 05/21/2021 at 7:22:17 PM.    Final     Labs:  CBC: Recent Labs    05/19/21 1950 05/19/21 2044 05/21/21 0257 06/04/21 0702  WBC 9.7  --  8.9 12.3*  HGB 11.0* 10.9* 10.0* 9.6*  HCT 32.1* 32.0* 28.8* 28.7*  PLT 280  --  222 247    COAGS: Recent Labs    05/19/21 1950 06/04/21 0702  INR 0.9 1.0  APTT 25  --     BMP: Recent Labs    05/19/21 1950 05/19/21 2044 05/21/21 0257 06/04/21 0702  NA 132* 136 135 133*  K 4.6 4.7 4.5 3.7  CL 103 106 104 102  CO2 20*  --  21* 22  GLUCOSE 174* 173* 208* 165*  BUN 44* 43* 45* 44*  CALCIUM 8.9  --  8.9 8.9  CREATININE 4.60* 5.00* 4.40* 5.28*  GFRNONAA 15*  --  16* 13*    LIVER FUNCTION TESTS: Recent Labs    05/19/21 1950 05/21/21 0257 06/04/21 0702  BILITOT 0.6  --   --   AST 19  --   --   ALT 20  --   --   ALKPHOS 94  --   --   PROT 6.9  --   --    ALBUMIN 3.4* 2.9* 2.9*    TUMOR MARKERS: No results for input(s): AFPTM, CEA, CA199, CHROMGRNA in the last 8760 hours.  Assessment and Plan: 50 y.o. male who was recently hospitalized from 05/20/21 to 05/21/21 due to subacute CVA and renal insufficiency. He had no kidney issues before, had a US renal and duplex for renal artery stenosis, which was all normal. Patient was discharged with nephrology f/u and referral to IR for random kidney bx.   Patient presents to [facility] IR today for the procedure.  Npo since midnight VSS CBC with mildly elevated WBC 12.3 - Dr. Pascal Lux notified. Mild anemia, stable  INR 1.0 RF worse than 7/13  Last Plavix and ASA 7/21   Risks and benefits of random renal biopsy  was discussed with the patient and/or patient's family including, but not limited to bleeding, infection, damage to adjacent structures or low yield requiring additional tests.  All of the questions were answered and there is agreement to proceed.  Consent signed and in chart.   Thank you for this interesting consult.  I greatly enjoyed meeting [employer] and look forward to participating in their care.  A copy of this report was sent to the requesting provider on this date.  Electronically Signed: Tera Mater, PA-C 06/04/2021, 8:14 AM   I spent a total of  30 Minutes   in face to face in clinical consultation, greater than 50% of which was counseling/coordinating care for random renal biopsy

## 2021-06-04 ENCOUNTER — Other Ambulatory Visit: Payer: Self-pay | Admitting: Student

## 2021-06-04 ENCOUNTER — Encounter (HOSPITAL_COMMUNITY): Payer: Self-pay

## 2021-06-04 ENCOUNTER — Ambulatory Visit (HOSPITAL_COMMUNITY)
Admission: RE | Admit: 2021-06-04 | Discharge: 2021-06-04 | Disposition: A | Discharge status: Home / Self Care | Payer: BC Managed Care – PPO | Source: Ambulatory Visit | Attending: Student | Admitting: Student

## 2021-06-04 DIAGNOSIS — R809 Proteinuria, unspecified: Secondary | ICD-10-CM | POA: Diagnosis present

## 2021-06-04 DIAGNOSIS — I7090 Unspecified atherosclerosis: Secondary | ICD-10-CM | POA: Insufficient documentation

## 2021-06-04 DIAGNOSIS — N269 Renal sclerosis, unspecified: Secondary | ICD-10-CM | POA: Insufficient documentation

## 2021-06-04 DIAGNOSIS — N289 Disorder of kidney and ureter, unspecified: Secondary | ICD-10-CM

## 2021-06-04 LAB — CBC
HCT: 28.7 % — ABNORMAL LOW (ref 39.0–52.0)
Hemoglobin: 9.6 g/dL — ABNORMAL LOW (ref 13.0–17.0)
MCH: 28.6 pg (ref 26.0–34.0)
MCHC: 33.4 g/dL (ref 30.0–36.0)
MCV: 85.4 fL (ref 80.0–100.0)
Platelets: 247 10*3/uL (ref 150–400)
RBC: 3.36 MIL/uL — ABNORMAL LOW (ref 4.22–5.81)
RDW: 12.1 % (ref 11.5–15.5)
WBC: 12.3 10*3/uL — ABNORMAL HIGH (ref 4.0–10.5)
nRBC: 0 % (ref 0.0–0.2)

## 2021-06-04 LAB — RENAL FUNCTION PANEL
Albumin: 2.9 g/dL — ABNORMAL LOW (ref 3.5–5.0)
Anion gap: 9 (ref 5–15)
BUN: 44 mg/dL — ABNORMAL HIGH (ref 6–20)
CO2: 22 mmol/L (ref 22–32)
Calcium: 8.9 mg/dL (ref 8.9–10.3)
Chloride: 102 mmol/L (ref 98–111)
Creatinine, Ser: 5.28 mg/dL — ABNORMAL HIGH (ref 0.61–1.24)
GFR, Estimated: 13 mL/min — ABNORMAL LOW (ref >60)
Glucose, Bld: 165 mg/dL — ABNORMAL HIGH (ref 70–99)
Phosphorus: 4.6 mg/dL (ref 2.5–4.6)
Potassium: 3.7 mmol/L (ref 3.5–5.1)
Sodium: 133 mmol/L — ABNORMAL LOW (ref 135–145)

## 2021-06-04 LAB — PROTIME-INR
INR: 1 (ref 0.8–1.2)
Prothrombin Time: 12.7 seconds (ref 11.4–15.2)

## 2021-06-04 LAB — GLUCOSE, CAPILLARY
Glucose-Capillary: 172 mg/dL — ABNORMAL HIGH (ref 70–99)
Glucose-Capillary: 167 mg/dL — ABNORMAL HIGH (ref 70–99)

## 2021-06-04 MED ORDER — FENTANYL CITRATE (PF) 100 MCG/2ML IJ SOLN
INTRAMUSCULAR | Status: AC
  Filled 2021-06-04: qty 2

## 2021-06-04 MED ORDER — MIDAZOLAM HCL 2 MG/2ML IJ SOLN
INTRAMUSCULAR | Status: AC | PRN
  Administered 2021-06-04: 1 mg via INTRAVENOUS

## 2021-06-04 MED ORDER — GELATIN ABSORBABLE 12-7 MM EX MISC
CUTANEOUS | Status: AC
  Filled 2021-06-04: qty 1

## 2021-06-04 MED ORDER — LIDOCAINE-EPINEPHRINE 1 %-1:100000 IJ SOLN
INTRAMUSCULAR | Status: AC
  Filled 2021-06-04: qty 1

## 2021-06-04 MED ORDER — SODIUM CHLORIDE 0.9 % IV SOLN: INTRAVENOUS | Status: DC

## 2021-06-04 MED ORDER — SODIUM CHLORIDE 0.9 % IV SOLN
INTRAVENOUS | Status: AC | PRN
  Administered 2021-06-04: 10 mL/h via INTRAVENOUS

## 2021-06-04 MED ORDER — FENTANYL CITRATE (PF) 100 MCG/2ML IJ SOLN
INTRAMUSCULAR | Status: AC | PRN
  Administered 2021-06-04: 50 ug via INTRAVENOUS

## 2021-06-04 MED ORDER — MIDAZOLAM HCL 2 MG/2ML IJ SOLN
INTRAMUSCULAR | Status: AC
  Filled 2021-06-04: qty 2

## 2021-06-04 NOTE — Procedures (Signed)
Pre Procedure Dx: Proteinuria Post Procedural Dx: Same  Technically successful US guided biopsy of inferior pole of the left kidney.  EBL: None  No immediate complications.   Jay Jove Beyl, MD Pager #: [phone]   

## 2021-06-04 NOTE — Sedation Documentation (Signed)
Dr Pascal Lux aware current BP 171/104 and elevated white count12.3, normal BP

## 2021-06-05 LAB — KAPPA/LAMBDA LIGHT CHAINS
Kappa free light chain: 118.6 mg/L — ABNORMAL HIGH (ref 3.3–19.4)
Kappa, lambda light chain ratio: 1.61 (ref 0.26–1.65)
Lambda free light chains: 73.8 mg/L — ABNORMAL HIGH (ref 5.7–26.3)

## 2021-06-11 ENCOUNTER — Telehealth: Payer: Self-pay | Admitting: Adult Health

## 2021-06-11 NOTE — Telephone Encounter (Signed)
I called Caryl Pina NP with [facility] back and advised Clearance would need to wait until f/u in the clinic took place. She verbalized understanding/agreement to this plan.  Pt will keep f/u as scheduled for 07/09/2021 and we can assess at this time.  Caryl Pina states the pt is scheduled for a Retina surgery, not for retinal detachment though. Caryl Pina states she will call the pt and let him know this procedure will have to be postponed.

## 2021-06-11 NOTE — Telephone Encounter (Signed)
Caryl Pina NP from [place] called wanting to get clearance for pt to have surgery on August 9th, she is aware he has an appt with [place] on August 31st. She says if she needs to postpone the surgery until after he is seen here then that is fine. Caryl Pina is requesting a call back at [phone] ext [phone]

## 2021-06-12 LAB — SURGICAL PATHOLOGY

## 2021-06-13 ENCOUNTER — Encounter (HOSPITAL_COMMUNITY): Payer: Self-pay

## 2021-07-01 ENCOUNTER — Emergency Department (HOSPITAL_COMMUNITY): Payer: BC Managed Care – PPO

## 2021-07-01 ENCOUNTER — Inpatient Hospital Stay (HOSPITAL_COMMUNITY)
Admission: EM | Admit: 2021-07-01 | Discharge: 2021-07-11 | DRG: 854 | Disposition: A | Discharge status: Home Health | Payer: BC Managed Care – PPO | Attending: Internal Medicine | Admitting: Internal Medicine

## 2021-07-01 ENCOUNTER — Other Ambulatory Visit: Payer: Self-pay

## 2021-07-01 DIAGNOSIS — Z6834 Body mass index (BMI) 34.0-34.9, adult: Secondary | ICD-10-CM

## 2021-07-01 DIAGNOSIS — I480 Paroxysmal atrial fibrillation: Secondary | ICD-10-CM | POA: Diagnosis not present

## 2021-07-01 DIAGNOSIS — I69354 Hemiplegia and hemiparesis following cerebral infarction affecting left non-dominant side: Secondary | ICD-10-CM

## 2021-07-01 DIAGNOSIS — E11621 Type 2 diabetes mellitus with foot ulcer: Secondary | ICD-10-CM | POA: Diagnosis present

## 2021-07-01 DIAGNOSIS — E782 Mixed hyperlipidemia: Secondary | ICD-10-CM | POA: Diagnosis present

## 2021-07-01 DIAGNOSIS — N179 Acute kidney failure, unspecified: Secondary | ICD-10-CM | POA: Diagnosis present

## 2021-07-01 DIAGNOSIS — L03116 Cellulitis of left lower limb: Secondary | ICD-10-CM | POA: Diagnosis present

## 2021-07-01 DIAGNOSIS — N185 Chronic kidney disease, stage 5: Secondary | ICD-10-CM | POA: Diagnosis present

## 2021-07-01 DIAGNOSIS — D62 Acute posthemorrhagic anemia: Secondary | ICD-10-CM | POA: Diagnosis not present

## 2021-07-01 DIAGNOSIS — E872 Acidosis: Secondary | ICD-10-CM | POA: Diagnosis not present

## 2021-07-01 DIAGNOSIS — E119 Type 2 diabetes mellitus without complications: Secondary | ICD-10-CM

## 2021-07-01 DIAGNOSIS — E1169 Type 2 diabetes mellitus with other specified complication: Secondary | ICD-10-CM | POA: Diagnosis present

## 2021-07-01 DIAGNOSIS — Z7984 Long term (current) use of oral hypoglycemic drugs: Secondary | ICD-10-CM

## 2021-07-01 DIAGNOSIS — I120 Hypertensive chronic kidney disease with stage 5 chronic kidney disease or end stage renal disease: Secondary | ICD-10-CM | POA: Diagnosis present

## 2021-07-01 DIAGNOSIS — L03119 Cellulitis of unspecified part of limb: Secondary | ICD-10-CM | POA: Diagnosis present

## 2021-07-01 DIAGNOSIS — E1165 Type 2 diabetes mellitus with hyperglycemia: Secondary | ICD-10-CM | POA: Diagnosis present

## 2021-07-01 DIAGNOSIS — A419 Sepsis, unspecified organism: Principal | ICD-10-CM | POA: Diagnosis present

## 2021-07-01 DIAGNOSIS — L089 Local infection of the skin and subcutaneous tissue, unspecified: Secondary | ICD-10-CM | POA: Diagnosis present

## 2021-07-01 DIAGNOSIS — Z79899 Other long term (current) drug therapy: Secondary | ICD-10-CM

## 2021-07-01 DIAGNOSIS — G5601 Carpal tunnel syndrome, right upper limb: Secondary | ICD-10-CM

## 2021-07-01 DIAGNOSIS — M869 Osteomyelitis, unspecified: Secondary | ICD-10-CM | POA: Diagnosis present

## 2021-07-01 DIAGNOSIS — Z823 Family history of stroke: Secondary | ICD-10-CM

## 2021-07-01 DIAGNOSIS — Z20822 Contact with and (suspected) exposure to covid-19: Secondary | ICD-10-CM | POA: Diagnosis present

## 2021-07-01 DIAGNOSIS — E1122 Type 2 diabetes mellitus with diabetic chronic kidney disease: Secondary | ICD-10-CM | POA: Diagnosis present

## 2021-07-01 DIAGNOSIS — E11628 Type 2 diabetes mellitus with other skin complications: Secondary | ICD-10-CM | POA: Diagnosis present

## 2021-07-01 DIAGNOSIS — A280 Pasteurellosis: Secondary | ICD-10-CM | POA: Diagnosis present

## 2021-07-01 DIAGNOSIS — E669 Obesity, unspecified: Secondary | ICD-10-CM | POA: Diagnosis present

## 2021-07-01 DIAGNOSIS — Z7902 Long term (current) use of antithrombotics/antiplatelets: Secondary | ICD-10-CM

## 2021-07-01 DIAGNOSIS — R0683 Snoring: Secondary | ICD-10-CM | POA: Diagnosis present

## 2021-07-01 DIAGNOSIS — R6520 Severe sepsis without septic shock: Secondary | ICD-10-CM | POA: Diagnosis present

## 2021-07-01 DIAGNOSIS — E871 Hypo-osmolality and hyponatremia: Secondary | ICD-10-CM | POA: Diagnosis present

## 2021-07-01 DIAGNOSIS — I10 Essential (primary) hypertension: Secondary | ICD-10-CM | POA: Diagnosis present

## 2021-07-01 LAB — CBC WITH DIFFERENTIAL/PLATELET
Abs Immature Granulocytes: 0 10*3/uL (ref 0.00–0.07)
Basophils Absolute: 0 10*3/uL (ref 0.0–0.1)
Basophils Relative: 0 %
Eosinophils Absolute: 0 10*3/uL (ref 0.0–0.5)
Eosinophils Relative: 0 %
HCT: 25.7 % — ABNORMAL LOW (ref 39.0–52.0)
Hemoglobin: 8.4 g/dL — ABNORMAL LOW (ref 13.0–17.0)
Lymphocytes Relative: 0 %
Lymphs Abs: 0 10*3/uL — ABNORMAL LOW (ref 0.7–4.0)
MCH: 27.7 pg (ref 26.0–34.0)
MCHC: 32.7 g/dL (ref 30.0–36.0)
MCV: 84.8 fL (ref 80.0–100.0)
Monocytes Absolute: 1.1 10*3/uL — ABNORMAL HIGH (ref 0.1–1.0)
Monocytes Relative: 4 %
Neutro Abs: 25.5 10*3/uL — ABNORMAL HIGH (ref 1.7–7.7)
Neutrophils Relative %: 96 %
Platelets: 411 10*3/uL — ABNORMAL HIGH (ref 150–400)
RBC: 3.03 MIL/uL — ABNORMAL LOW (ref 4.22–5.81)
RDW: 12.2 % (ref 11.5–15.5)
WBC: 26.6 10*3/uL — ABNORMAL HIGH (ref 4.0–10.5)
nRBC: 0 % (ref 0.0–0.2)
nRBC: 0 /100 WBC

## 2021-07-01 LAB — COMPREHENSIVE METABOLIC PANEL
ALT: 72 U/L — ABNORMAL HIGH (ref 0–44)
AST: 80 U/L — ABNORMAL HIGH (ref 15–41)
Albumin: 2.7 g/dL — ABNORMAL LOW (ref 3.5–5.0)
Alkaline Phosphatase: 82 U/L (ref 38–126)
Anion gap: 16 — ABNORMAL HIGH (ref 5–15)
BUN: 60 mg/dL — ABNORMAL HIGH (ref 6–20)
CO2: 17 mmol/L — ABNORMAL LOW (ref 22–32)
Calcium: 9.1 mg/dL (ref 8.9–10.3)
Chloride: 95 mmol/L — ABNORMAL LOW (ref 98–111)
Creatinine, Ser: 6.58 mg/dL — ABNORMAL HIGH (ref 0.61–1.24)
GFR, Estimated: 10 mL/min — ABNORMAL LOW (ref >60)
Glucose, Bld: 254 mg/dL — ABNORMAL HIGH (ref 70–99)
Potassium: 4.1 mmol/L (ref 3.5–5.1)
Sodium: 128 mmol/L — ABNORMAL LOW (ref 135–145)
Total Bilirubin: 0.5 mg/dL (ref 0.3–1.2)
Total Protein: 8 g/dL (ref 6.5–8.1)

## 2021-07-01 LAB — APTT: aPTT: 34 seconds (ref 24–36)

## 2021-07-01 LAB — PROTIME-INR
INR: 1.5 — ABNORMAL HIGH (ref 0.8–1.2)
Prothrombin Time: 18.2 seconds — ABNORMAL HIGH (ref 11.4–15.2)

## 2021-07-01 LAB — LACTIC ACID, PLASMA: Lactic Acid, Venous: 1.2 mmol/L (ref 0.5–1.9)

## 2021-07-01 NOTE — ED Triage Notes (Signed)
Pt reports his doctor sent him to the ED for an infection in his left foot. Pt reports pain, fever and tightness in his foot.

## 2021-07-01 NOTE — ED Provider Notes (Signed)
Emergency Medicine Provider Triage Evaluation Note  Bruce Little , a 50 y.o. male  was evaluated in triage.  Pt complains of concern for infection in his left foot.  He states that he went to his doctor was concerned and sent him here.  He denies any known fevers prior to arrival.  He states that he has generally been feeling poorly.  He noted an odor in the foot over the weekend..  Review of Systems  Positive: Foot pain, odor Negative: vomiting  Physical Exam  BP (!) 158/86 (BP Location: Right Arm)   Pulse (!) 111   Temp 100.3 F (37.9 C) (Oral)   Resp 20   Ht '6\' 2"'$  (1.88 m)   Wt 111.1 kg   SpO2 98%   BMI 31.46 kg/m  Gen:   Awake, no distress   Resp:  Normal effort  MSK:   Moves extremities without difficulty  Other:  Foot is not undressed, obvious odor.  Medical Decision Making  Medically screening exam initiated at 8:37 PM.  Appropriate orders placed.  Bruce Little was informed that the remainder of the evaluation will be completed by another provider, this initial triage assessment does not replace that evaluation, and the importance of remaining in the ED until their evaluation is complete.  Patient is borderline febrile with elevated heart rate in the setting of concern for infection. X-rays ordered to evaluate for gas or other acute abnormalities.  Sepsis type orders are placed without antibiotics at this point as patient is in triage.   Note: Portions of this report may have been transcribed using voice recognition software. Every effort was made to ensure accuracy; however, inadvertent computerized transcription errors may be present    Ollen Gross 07/01/21 2039    Godfrey Pick, MD 07/02/21 [phone]

## 2021-07-02 ENCOUNTER — Inpatient Hospital Stay (HOSPITAL_COMMUNITY): Payer: BC Managed Care – PPO

## 2021-07-02 ENCOUNTER — Encounter (HOSPITAL_COMMUNITY): Payer: Self-pay | Admitting: Internal Medicine

## 2021-07-02 DIAGNOSIS — E1165 Type 2 diabetes mellitus with hyperglycemia: Secondary | ICD-10-CM | POA: Diagnosis present

## 2021-07-02 DIAGNOSIS — L03119 Cellulitis of unspecified part of limb: Secondary | ICD-10-CM

## 2021-07-02 DIAGNOSIS — I480 Paroxysmal atrial fibrillation: Secondary | ICD-10-CM | POA: Diagnosis not present

## 2021-07-02 DIAGNOSIS — E1159 Type 2 diabetes mellitus with other circulatory complications: Secondary | ICD-10-CM | POA: Diagnosis not present

## 2021-07-02 DIAGNOSIS — Z6834 Body mass index (BMI) 34.0-34.9, adult: Secondary | ICD-10-CM | POA: Diagnosis not present

## 2021-07-02 DIAGNOSIS — I69354 Hemiplegia and hemiparesis following cerebral infarction affecting left non-dominant side: Secondary | ICD-10-CM | POA: Diagnosis not present

## 2021-07-02 DIAGNOSIS — D62 Acute posthemorrhagic anemia: Secondary | ICD-10-CM | POA: Diagnosis not present

## 2021-07-02 DIAGNOSIS — R6520 Severe sepsis without septic shock: Secondary | ICD-10-CM

## 2021-07-02 DIAGNOSIS — Z20822 Contact with and (suspected) exposure to covid-19: Secondary | ICD-10-CM | POA: Diagnosis present

## 2021-07-02 DIAGNOSIS — E1122 Type 2 diabetes mellitus with diabetic chronic kidney disease: Secondary | ICD-10-CM | POA: Diagnosis present

## 2021-07-02 DIAGNOSIS — E11621 Type 2 diabetes mellitus with foot ulcer: Secondary | ICD-10-CM | POA: Diagnosis present

## 2021-07-02 DIAGNOSIS — L03116 Cellulitis of left lower limb: Secondary | ICD-10-CM | POA: Diagnosis present

## 2021-07-02 DIAGNOSIS — E1169 Type 2 diabetes mellitus with other specified complication: Secondary | ICD-10-CM | POA: Diagnosis present

## 2021-07-02 DIAGNOSIS — E872 Acidosis: Secondary | ICD-10-CM | POA: Diagnosis not present

## 2021-07-02 DIAGNOSIS — E871 Hypo-osmolality and hyponatremia: Secondary | ICD-10-CM | POA: Diagnosis present

## 2021-07-02 DIAGNOSIS — G5601 Carpal tunnel syndrome, right upper limb: Secondary | ICD-10-CM | POA: Diagnosis not present

## 2021-07-02 DIAGNOSIS — M869 Osteomyelitis, unspecified: Secondary | ICD-10-CM | POA: Diagnosis present

## 2021-07-02 DIAGNOSIS — N185 Chronic kidney disease, stage 5: Secondary | ICD-10-CM

## 2021-07-02 DIAGNOSIS — E782 Mixed hyperlipidemia: Secondary | ICD-10-CM | POA: Diagnosis present

## 2021-07-02 DIAGNOSIS — A419 Sepsis, unspecified organism: Secondary | ICD-10-CM | POA: Diagnosis present

## 2021-07-02 DIAGNOSIS — I120 Hypertensive chronic kidney disease with stage 5 chronic kidney disease or end stage renal disease: Secondary | ICD-10-CM | POA: Diagnosis present

## 2021-07-02 DIAGNOSIS — R0683 Snoring: Secondary | ICD-10-CM | POA: Diagnosis present

## 2021-07-02 DIAGNOSIS — L089 Local infection of the skin and subcutaneous tissue, unspecified: Principal | ICD-10-CM

## 2021-07-02 DIAGNOSIS — N179 Acute kidney failure, unspecified: Secondary | ICD-10-CM | POA: Diagnosis present

## 2021-07-02 DIAGNOSIS — E11628 Type 2 diabetes mellitus with other skin complications: Secondary | ICD-10-CM | POA: Diagnosis present

## 2021-07-02 DIAGNOSIS — Z79899 Other long term (current) drug therapy: Secondary | ICD-10-CM | POA: Diagnosis not present

## 2021-07-02 DIAGNOSIS — I10 Essential (primary) hypertension: Secondary | ICD-10-CM | POA: Diagnosis not present

## 2021-07-02 DIAGNOSIS — A280 Pasteurellosis: Secondary | ICD-10-CM | POA: Diagnosis present

## 2021-07-02 DIAGNOSIS — Z823 Family history of stroke: Secondary | ICD-10-CM | POA: Diagnosis not present

## 2021-07-02 DIAGNOSIS — E669 Obesity, unspecified: Secondary | ICD-10-CM | POA: Diagnosis present

## 2021-07-02 HISTORY — DX: Chronic kidney disease, stage 5: N18.5

## 2021-07-02 LAB — CBC WITH DIFFERENTIAL/PLATELET
Abs Immature Granulocytes: 0.25 10*3/uL — ABNORMAL HIGH (ref 0.00–0.07)
Basophils Absolute: 0.1 10*3/uL (ref 0.0–0.1)
Basophils Relative: 0 %
Eosinophils Absolute: 0.1 10*3/uL (ref 0.0–0.5)
Eosinophils Relative: 0 %
HCT: 27.4 % — ABNORMAL LOW (ref 39.0–52.0)
Hemoglobin: 9.2 g/dL — ABNORMAL LOW (ref 13.0–17.0)
Immature Granulocytes: 1 %
Lymphocytes Relative: 7 %
Lymphs Abs: 1.8 10*3/uL (ref 0.7–4.0)
MCH: 28.3 pg (ref 26.0–34.0)
MCHC: 33.6 g/dL (ref 30.0–36.0)
MCV: 84.3 fL (ref 80.0–100.0)
Monocytes Absolute: 2.4 10*3/uL — ABNORMAL HIGH (ref 0.1–1.0)
Monocytes Relative: 9 %
Neutro Abs: 22.1 10*3/uL — ABNORMAL HIGH (ref 1.7–7.7)
Neutrophils Relative %: 83 %
Platelets: 435 10*3/uL — ABNORMAL HIGH (ref 150–400)
RBC: 3.25 MIL/uL — ABNORMAL LOW (ref 4.22–5.81)
RDW: 12.2 % (ref 11.5–15.5)
WBC: 26.7 10*3/uL — ABNORMAL HIGH (ref 4.0–10.5)
nRBC: 0 % (ref 0.0–0.2)

## 2021-07-02 LAB — SURGICAL PCR SCREEN
MRSA, PCR: NEGATIVE
Staphylococcus aureus: NEGATIVE

## 2021-07-02 LAB — RESP PANEL BY RT-PCR (FLU A&B, COVID) ARPGX2
Influenza A by PCR: NEGATIVE
Influenza B by PCR: NEGATIVE
SARS Coronavirus 2 by RT PCR: NEGATIVE

## 2021-07-02 LAB — HEPATIC FUNCTION PANEL
ALT: 82 U/L — ABNORMAL HIGH (ref 0–44)
AST: 94 U/L — ABNORMAL HIGH (ref 15–41)
Albumin: 2.7 g/dL — ABNORMAL LOW (ref 3.5–5.0)
Alkaline Phosphatase: 90 U/L (ref 38–126)
Bilirubin, Direct: <0.1 mg/dL (ref 0.0–0.2)
Total Bilirubin: 0.8 mg/dL (ref 0.3–1.2)
Total Protein: 8.4 g/dL — ABNORMAL HIGH (ref 6.5–8.1)

## 2021-07-02 LAB — BASIC METABOLIC PANEL
Anion gap: 16 — ABNORMAL HIGH (ref 5–15)
BUN: 61 mg/dL — ABNORMAL HIGH (ref 6–20)
CO2: 21 mmol/L — ABNORMAL LOW (ref 22–32)
Calcium: 9.9 mg/dL (ref 8.9–10.3)
Chloride: 93 mmol/L — ABNORMAL LOW (ref 98–111)
Creatinine, Ser: 6.63 mg/dL — ABNORMAL HIGH (ref 0.61–1.24)
GFR, Estimated: 10 mL/min — ABNORMAL LOW (ref >60)
Glucose, Bld: 178 mg/dL — ABNORMAL HIGH (ref 70–99)
Potassium: 4.1 mmol/L (ref 3.5–5.1)
Sodium: 130 mmol/L — ABNORMAL LOW (ref 135–145)

## 2021-07-02 LAB — CBG MONITORING, ED
Glucose-Capillary: 168 mg/dL — ABNORMAL HIGH (ref 70–99)
Glucose-Capillary: 149 mg/dL — ABNORMAL HIGH (ref 70–99)
Glucose-Capillary: 141 mg/dL — ABNORMAL HIGH (ref 70–99)

## 2021-07-02 LAB — HEMOGLOBIN A1C
Hgb A1c MFr Bld: 8.4 % — ABNORMAL HIGH (ref 4.8–5.6)
Mean Plasma Glucose: 194.38 mg/dL

## 2021-07-02 LAB — URINALYSIS, ROUTINE W REFLEX MICROSCOPIC
Bilirubin Urine: NEGATIVE
Glucose, UA: 150 mg/dL — AB
Ketones, ur: NEGATIVE mg/dL
Leukocytes,Ua: NEGATIVE
Nitrite: NEGATIVE
Protein, ur: 100 mg/dL — AB
Specific Gravity, Urine: 1.016 (ref 1.005–1.030)
pH: 5 (ref 5.0–8.0)

## 2021-07-02 LAB — LACTIC ACID, PLASMA
Lactic Acid, Venous: 1.1 mmol/L (ref 0.5–1.9)
Lactic Acid, Venous: 0.7 mmol/L (ref 0.5–1.9)

## 2021-07-02 LAB — GLUCOSE, CAPILLARY
Glucose-Capillary: 203 mg/dL — ABNORMAL HIGH (ref 70–99)
Glucose-Capillary: 161 mg/dL — ABNORMAL HIGH (ref 70–99)

## 2021-07-02 LAB — ABO/RH: ABO/RH(D): B POS

## 2021-07-02 MED ORDER — FENTANYL CITRATE (PF) 100 MCG/2ML IJ SOLN
25.0000 ug | INTRAMUSCULAR | Status: DC | PRN
  Administered 2021-07-03 – 2021-07-04 (×2): 25 ug via INTRAVENOUS
  Filled 2021-07-02 (×2): qty 2

## 2021-07-02 MED ORDER — INSULIN GLARGINE-YFGN 100 UNIT/ML ~~LOC~~ SOLN
10.0000 [IU] | Freq: Every day | SUBCUTANEOUS | Status: DC
  Administered 2021-07-02 – 2021-07-11 (×10): 10 [IU] via SUBCUTANEOUS
  Filled 2021-07-02 (×10): qty 0.1

## 2021-07-02 MED ORDER — LORAZEPAM 2 MG/ML IJ SOLN
1.0000 mg | Freq: Once | INTRAMUSCULAR | Status: AC | PRN
  Administered 2021-07-02: 1 mg via INTRAVENOUS
  Filled 2021-07-02: qty 1

## 2021-07-02 MED ORDER — PIPERACILLIN-TAZOBACTAM IN DEX 2-0.25 GM/50ML IV SOLN
2.2500 g | Freq: Three times a day (TID) | INTRAVENOUS | Status: DC
  Administered 2021-07-02 – 2021-07-05 (×9): 2.25 g via INTRAVENOUS
  Filled 2021-07-02 (×12): qty 50

## 2021-07-02 MED ORDER — FENTANYL CITRATE PF 50 MCG/ML IJ SOSY
25.0000 ug | PREFILLED_SYRINGE | INTRAMUSCULAR | Status: DC | PRN
Start: 2021-07-02 — End: 2021-07-02

## 2021-07-02 MED ORDER — LACTATED RINGERS IV SOLN: INTRAVENOUS | Status: DC

## 2021-07-02 MED ORDER — VANCOMYCIN HCL IN DEXTROSE 1-5 GM/200ML-% IV SOLN: 1000.0000 mg | Freq: Once | INTRAVENOUS | Status: DC

## 2021-07-02 MED ORDER — AMLODIPINE BESYLATE 10 MG PO TABS
10.0000 mg | ORAL_TABLET | Freq: Every day | ORAL | Status: DC
  Administered 2021-07-02 – 2021-07-03 (×2): 10 mg via ORAL
  Filled 2021-07-02: qty 2
  Filled 2021-07-02: qty 1

## 2021-07-02 MED ORDER — FENTANYL CITRATE PF 50 MCG/ML IJ SOSY: 25.0000 ug | PREFILLED_SYRINGE | INTRAMUSCULAR | Status: DC | PRN

## 2021-07-02 MED ORDER — ACETAMINOPHEN 650 MG RE SUPP: 650.0000 mg | Freq: Four times a day (QID) | RECTAL | Status: DC | PRN

## 2021-07-02 MED ORDER — FENOFIBRATE 160 MG PO TABS
160.0000 mg | ORAL_TABLET | Freq: Every day | ORAL | Status: DC
  Administered 2021-07-02 – 2021-07-11 (×10): 160 mg via ORAL
  Filled 2021-07-02 (×10): qty 1

## 2021-07-02 MED ORDER — CEFAZOLIN IN SODIUM CHLORIDE 3-0.9 GM/100ML-% IV SOLN
3.0000 g | INTRAVENOUS | Status: DC
  Filled 2021-07-02: qty 100

## 2021-07-02 MED ORDER — ATORVASTATIN CALCIUM 80 MG PO TABS
80.0000 mg | ORAL_TABLET | Freq: Every day | ORAL | Status: DC
  Administered 2021-07-02 – 2021-07-11 (×10): 80 mg via ORAL
  Filled 2021-07-02 (×10): qty 1

## 2021-07-02 MED ORDER — INSULIN ASPART 100 UNIT/ML IJ SOLN
0.0000 [IU] | INTRAMUSCULAR | Status: DC
  Administered 2021-07-02: 2 [IU] via SUBCUTANEOUS
  Administered 2021-07-02 – 2021-07-03 (×5): 1 [IU] via SUBCUTANEOUS
  Administered 2021-07-04: 3 [IU] via SUBCUTANEOUS

## 2021-07-02 MED ORDER — SODIUM CHLORIDE 0.9 % IV SOLN: INTRAVENOUS | Status: DC

## 2021-07-02 MED ORDER — HYDROXYZINE HCL 25 MG PO TABS
25.0000 mg | ORAL_TABLET | Freq: Once | ORAL | Status: AC
  Administered 2021-07-02: 25 mg via ORAL
  Filled 2021-07-02: qty 1

## 2021-07-02 MED ORDER — VANCOMYCIN VARIABLE DOSE PER UNSTABLE RENAL FUNCTION (PHARMACIST DOSING): Status: DC

## 2021-07-02 MED ORDER — VANCOMYCIN HCL 2000 MG/400ML IV SOLN
2000.0000 mg | Freq: Once | INTRAVENOUS | Status: AC
  Administered 2021-07-02: 2000 mg via INTRAVENOUS
  Filled 2021-07-02: qty 400

## 2021-07-02 MED ORDER — PIPERACILLIN-TAZOBACTAM 3.375 G IVPB
3.3750 g | Freq: Once | INTRAVENOUS | Status: AC
  Administered 2021-07-02: 3.375 g via INTRAVENOUS
  Filled 2021-07-02: qty 50

## 2021-07-02 MED ORDER — ACETAMINOPHEN 325 MG PO TABS
650.0000 mg | ORAL_TABLET | Freq: Four times a day (QID) | ORAL | Status: DC | PRN
  Administered 2021-07-02 – 2021-07-10 (×16): 650 mg via ORAL
  Filled 2021-07-02 (×16): qty 2

## 2021-07-02 NOTE — H&P (Signed)
History and Physical    Bruce Little [id number] DOB: [birth year] DOA: 07/01/2021  PCP: [facility]  Patient coming from: Home.  Chief Complaint: Left foot wound getting worse.  HPI: Bruce Little is a 50 y.o. male with history of diabetes mellitus type 2, chronic kidney disease stage V, anemia, hypertension recently admitted for stroke discharged on May 21, 2021 had a recent kidney biopsy presents to the ER with complaints of worsening infection of the left foot with increasing pain.  Has subjective feeling of fever chills.  Patient states he developed a wound on the plantar aspect of the left foot about few days weeks ago.  5 days ago he was in the swimming pool when he started developing discoloration of his left small toe which has become more discolored gangrenous looking.  He presents to the ER.  ED Course: In the ER his left foot on the lateral aspect around the small toe appears gangrenous and also involves metatarsal heads.  Has good pulses.  Patient is tachycardic with labs showing WBC count of 26,000 creatinine has worsened from 5.2 last month it is around 6.5 anion gap of 16 blood glucose of 254 temperature 100.3.  X-ray of the left foot does not show any bony involvement but did show some gas.  The ER physician discussed with on-call orthopedic surgeon Dr. Erlinda Hong to be seeing patient in consult.  Patient started on antibiotics after cultures obtained.  Admitted for cellulitis of left foot with necrotic looking toes.  COVID test was negative.  Review of Systems: As per HPI, rest all negative.   Past Medical History:  Diagnosis Date   DM2 (diabetes mellitus, type 2) ([place])    HTN (hypertension)     History reviewed. No pertinent surgical history.   reports that he has never smoked. He has never used smokeless tobacco. He reports current alcohol use. He reports that he does not use drugs.  No Known Allergies  Family History  Problem Relation Age of Onset   Stroke  Mother     Prior to Admission medications   Medication Sig Start Date End Date Taking? Authorizing Provider  acetaminophen (TYLENOL) 325 MG tablet Take 2 tablets (650 mg total) by mouth every 4 (four) hours as needed for mild pain (or temp > 37.5 C (99.5 F)). 05/21/21   Sheikh, Omair Latif, DO  amLODipine (NORVASC) 10 MG tablet Take 1 tablet (10 mg total) by mouth daily. 05/21/21 06/20/21  Raiford Noble Latif, DO  atorvastatin (LIPITOR) 80 MG tablet Take 1 tablet (80 mg total) by mouth daily. 05/22/21   Raiford Noble Latif, DO  clopidogrel (PLAVIX) 75 MG tablet Take 1 tablet (75 mg total) by mouth daily. 05/22/21   Raiford Noble Latif, DO  fenofibrate 160 MG tablet Take 1 tablet (160 mg total) by mouth daily. 05/21/21   Sheikh, Omair Latif, DO  glipiZIDE (GLUCOTROL) 5 MG tablet Take 1 tablet (5 mg total) by mouth daily. 05/21/21 05/21/22  Raiford Noble Latif, DO  hydrocortisone cream 1 % Apply 1 application topically 2 (two) times daily as needed for itching (rash).    [provider]  hydrOXYzine (ATARAX/VISTARIL) 25 MG tablet Take 12.5-25 mg by mouth daily as needed for anxiety. 03/30/21   [provider]  loratadine (CLARITIN) 10 MG tablet Take 10 mg by mouth daily as needed for allergies.    [provider]  multivitamin (ONE-A-DAY MEN'S) TABS tablet Take 1 tablet by mouth daily.    [provider]  pantoprazole (PROTONIX) 40 MG tablet Take 1 tablet (40 mg total) by mouth daily. 05/22/21   Sheikh, Omair Latif, DO  VICTOZA 18 MG/3ML SOPN Inject 1.8 mg into the skin daily. 05/07/21   [provider]  zolpidem (AMBIEN) 10 MG tablet Take 5-10 mg by mouth at bedtime as needed for sleep. 11/28/20   [provider]    Physical Exam: Constitutional: Moderately built and nourished. Vitals:   07/02/21 0211 07/02/21 0312 07/02/21 0434 07/02/21 0459  BP: (!) 141/117 112/84  (!) 178/102  Pulse: (!) 105 (!) 104  (!) 109  Resp: '18 16  20  '$ Temp:    99.6 F  (37.6 C)  TempSrc:    Oral  SpO2: 100% 98%  99%  Weight:   122 kg   Height:   '6\' 2"'$  (1.88 m)    Eyes: Anicteric no pallor. ENMT: No discharge from the ears eyes nose and mouth. Neck: No mass felt.  No neck rigidity. Respiratory: No rhonchi or crepitations. Cardiovascular: S1-S2 heard. Abdomen: Soft nontender bowel sound present. Musculoskeletal: Left foot on the lateral aspect near the small toe looks necrotic but I do not see any active discharge.  Has good pulses. Skin: Left foot around the small toe looks necrotic. Neurologic: Alert awake oriented to time place and person.  Moves all extremities. Psychiatric: Appears normal.  Normal affect.   Labs on Admission: I have personally reviewed following labs and imaging studies  CBC: Recent Labs  Lab 07/01/21 2055  WBC 26.6*  NEUTROABS 25.5*  HGB 8.4*  HCT 25.7*  MCV 84.8  PLT 123456*   Basic Metabolic Panel: Recent Labs  Lab 07/01/21 2055  NA 128*  K 4.1  CL 95*  CO2 17*  GLUCOSE 254*  BUN 60*  CREATININE 6.58*  CALCIUM 9.1   GFR: Estimated Creatinine Clearance: 18.8 mL/min (A) (by C-G formula based on SCr of 6.58 mg/dL (H)). Liver Function Tests: Recent Labs  Lab 07/01/21 2055  AST 80*  ALT 72*  ALKPHOS 82  BILITOT 0.5  PROT 8.0  ALBUMIN 2.7*   No results for input(s): LIPASE, AMYLASE in the last 168 hours. No results for input(s): AMMONIA in the last 168 hours. Coagulation Profile: Recent Labs  Lab 07/01/21 2055  INR 1.5*   Cardiac Enzymes: No results for input(s): CKTOTAL, CKMB, CKMBINDEX, TROPONINI in the last 168 hours. BNP (last 3 results) No results for input(s): PROBNP in the last 8760 hours. HbA1C: No results for input(s): HGBA1C in the last 72 hours. CBG: No results for input(s): GLUCAP in the last 168 hours. Lipid Profile: No results for input(s): CHOL, HDL, LDLCALC, TRIG, CHOLHDL, LDLDIRECT in the last 72 hours. Thyroid Function Tests: No results for input(s): TSH, T4TOTAL, FREET4,  T3FREE, THYROIDAB in the last 72 hours. Anemia Panel: No results for input(s): VITAMINB12, FOLATE, FERRITIN, TIBC, IRON, RETICCTPCT in the last 72 hours. Urine analysis:    Component Value Date/Time   COLORURINE YELLOW 07/01/2021 2036   APPEARANCEUR HAZY (A) 07/01/2021 2036   LABSPEC 1.016 07/01/2021 2036   PHURINE 5.0 07/01/2021 2036   GLUCOSEU 150 (A) 07/01/2021 2036   HGBUR MODERATE (A) 07/01/2021 2036   BILIRUBINUR NEGATIVE 07/01/2021 2036   [place] NEGATIVE 07/01/2021 2036   PROTEINUR 100 (A) 07/01/2021 2036   NITRITE NEGATIVE 07/01/2021 2036   LEUKOCYTESUR NEGATIVE 07/01/2021 2036   Sepsis Labs: '@LABRCNTIP'$ (procalcitonin:4,lacticidven:4) ) Recent Results (from the past 240 hour(s))  Resp Panel by RT-PCR (Flu A&B, Covid) Nasopharyngeal Swab  Status: None   Collection Time: 07/02/21 12:51 AM   Specimen: Nasopharyngeal Swab; Nasopharyngeal(NP) swabs in vial transport medium  Result Value Ref Range Status   SARS Coronavirus 2 by RT PCR NEGATIVE NEGATIVE Final    Comment: (NOTE) SARS-CoV-2 target nucleic acids are NOT DETECTED.  The SARS-CoV-2 RNA is generally detectable in upper respiratory specimens during the acute phase of infection. The lowest concentration of SARS-CoV-2 viral copies this assay can detect is 138 copies/mL. A negative result does not preclude SARS-Cov-2 infection and should not be used as the sole basis for treatment or other patient management decisions. A negative result may occur with  improper specimen collection/handling, submission of specimen other than nasopharyngeal swab, presence of viral mutation(s) within the areas targeted by this assay, and inadequate number of viral copies(<138 copies/mL). A negative result must be combined with clinical observations, patient history, and epidemiological information. The expected result is Negative.  Fact Sheet for Patients:  EntrepreneurPulse.com.au  Fact Sheet for Healthcare  Providers:  IncredibleEmployment.be  This test is no t yet approved or cleared by the [place] FDA and  has been authorized for detection and/or diagnosis of SARS-CoV-2 by FDA under an Emergency Use Authorization (EUA). This EUA will remain  in effect (meaning this test can be used) for the duration of the COVID-19 declaration under Section 564(b)(1) of the Act, 21 U.S.C.section 360bbb-3(b)(1), unless the authorization is terminated  or revoked sooner.       Influenza A by PCR NEGATIVE NEGATIVE Final   Influenza B by PCR NEGATIVE NEGATIVE Final    Comment: (NOTE) The Xpert Xpress SARS-CoV-2/FLU/RSV plus assay is intended as an aid in the diagnosis of influenza from Nasopharyngeal swab specimens and should not be used as a sole basis for treatment. Nasal washings and aspirates are unacceptable for Xpert Xpress SARS-CoV-2/FLU/RSV testing.  Fact Sheet for Patients: EntrepreneurPulse.com.au  Fact Sheet for Healthcare Providers: IncredibleEmployment.be  This test is not yet approved or cleared by the [place] FDA and has been authorized for detection and/or diagnosis of SARS-CoV-2 by FDA under an Emergency Use Authorization (EUA). This EUA will remain in effect (meaning this test can be used) for the duration of the COVID-19 declaration under Section 564(b)(1) of the Act, 21 U.S.C. section 360bbb-3(b)(1), unless the authorization is terminated or revoked.  Performed at [facility], [place] [address] [place], [place] [address]      Radiological Exams on Admission: DG Chest 1 View  Result Date: 07/01/2021 CLINICAL DATA:  Sepsis.  Foot infection. EXAM: CHEST  1 VIEW COMPARISON:  None. FINDINGS: Low lung volumes.The cardiomediastinal contours are normal. The lungs are clear. Pulmonary vasculature is normal. No consolidation, pleural effusion, or pneumothorax. No acute osseous abnormalities are seen. IMPRESSION: Low  lung volumes without acute chest finding. Electronically Signed   By: Keith Rake M.D.   On: 07/01/2021 21:34   DG Foot Complete Left  Result Date: 07/01/2021 CLINICAL DATA:  Questionable sepsis. EXAM: LEFT FOOT - COMPLETE 3+ VIEW COMPARISON:  None. FINDINGS: There is no acute fracture or dislocation. The bones are mildly osteopenic. No bone erosion or periosteal elevation to suggest osteomyelitis. There is soft tissue swelling of the forefoot. There is soft tissue gas centered at the fifth MTP joint consistent with gas-forming infectious/necrotizing fasciitis. MRI may provide better evaluation if there is clinical concern for acute osteomyelitis. IMPRESSION: 1. No acute fracture or dislocation. 2. Soft tissue gas centered at the fifth MTP joint. No radiographic evidence of osteomyelitis. Electronically  Signed   By: Anner Crete M.D.   On: 07/01/2021 21:36    EKG: Independently reviewed.  Sinus tachycardia.  Assessment/Plan Principal Problem:   Cellulitis of foot Active Problems:   DM2 (diabetes mellitus, type 2) (HCC)   HTN (hypertension)   CKD (chronic kidney disease) stage 5, GFR less than 15 ml/min (HCC)    Cellulitis of the left foot with necrotic looking small toe -patient is on empiric antibiotics and orthopedic surgeon on-call Dr. Erlinda Hong has been consulted.  We will continue on fluids keep patient n.p.o. in anticipation of possible surgery if needed.  Pain relief medications. Diabetes mellitus type 2 uncontrolled patient's anion gap is mildly elevated we will recheck metabolic panel.  I have ordered Lantus 10 units for now with sliding scale coverage.  If repeat anion gap is elevated or shows signs of DKA will need to be on insulin infusion. Hypertension uncontrolled amlodipine..  IV hydralazine has been added. Recent stroke on Plavix.  Presently we will keep n.p.o. and if no surgery anticipated restart Plavix.  Patient also takes statins. Acute on chronic kidney disease stage V  creatinine has further worsened from 5.2-6.5.  May need nephrology input.  Has had recent kidney biopsy. Worsening anemia follow CBC type and screen transfuse if hemoglobin falls less than 7.  This patient has symptoms concerning for possible developing sepsis with cellulitis and necrotic looking toes will need close monitoring for any further worsening inpatient status.   DVT prophylaxis: SCDs.  Avoiding anticoagulation in anticipation of possible surgery. Code Status: Full code. Family Communication: Discussed with patient. Disposition Plan: Home. Consults called: Orthopedics. Admission status: Inpatient.   Rise Patience MD [facility] Pager [phone].  If 7PM-7AM, please contact night-coverage [email or web address] Password [facility]  07/02/2021, 5:53 AM

## 2021-07-02 NOTE — ED Notes (Signed)
Patient transported to MRI 

## 2021-07-02 NOTE — ED Notes (Signed)
Attempted to give reportx1 

## 2021-07-02 NOTE — Progress Notes (Signed)
   07/02/21 1956  Assess: MEWS Score  Temp 99.3 F (37.4 C)  BP (!) 162/90  Pulse Rate (!) 118  Resp 20  SpO2 93 %  O2 Device Room Air  Assess: MEWS Score  MEWS Temp 0  MEWS Systolic 0  MEWS Pulse 2  MEWS RR 0  MEWS LOC 0  MEWS Score 2  MEWS Score Color Yellow  Assess: if the MEWS score is Yellow or Red  Were vital signs taken at a resting state? Yes  Focused Assessment No change from prior assessment  Early Detection of Sepsis Score *See Row Information* Medium  MEWS guidelines implemented *See Row Information* Yes  Take Vital Signs  Increase Vital Sign Frequency  Yellow: Q 2hr X 2 then Q 4hr X 2, if remains yellow, continue Q 4hrs  Escalate  MEWS: Escalate Yellow: discuss with charge nurse/RN and consider discussing with provider and RRT  Notify: Charge Nurse/RN  Name of Charge Nurse/RN Notified O[place]dock, Rn  Notify: Provider  Provider Name/Title Gershon Cull, NP  Date Provider Notified 07/02/21  Time Provider Notified 2010  Notification Type Page (Secure chat)  Notification Reason Other (Comment) (HR 118)  Provider response No new orders  Date of Provider Response 07/02/21  Time of Provider Response 2014

## 2021-07-02 NOTE — Consult Note (Signed)
Bruce Little Admit Date: 07/01/2021 07/02/2021 Bruce Little Requesting Physician:  Sloan Leiter MD  Reason for Consult:  Progressive CKD HPI:  76M admitted overnight with worsening left foot ulcer.  PMH includes recent admission for acute ischemic CVA, longstanding DM2, hypertension, hyperlipidemia.  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  He follows with Dr. Johnney Ou and recently was referred for transplant as well as first  steps for dialysis preparation.  Work-up regarding his foot ulcer has included MRI demonstrating osteomyelitis at the fifth metatarsal head.  He has been placed on vancomycin and Zosyn.  Orthopedics is evaluating, likely for surgery/amputation.  Creatinine at presentation was 6.6, repeated and similar today.  Electrolytes are stable.  Hemoglobin is mildly depressed at 9.2.  He denies significant uremic symptoms other than some anorexia over the past week which is not surprising given his infection.  Blood pressures are currently at target.  He is receiving normal saline at 150 mL/h.   Creatinine, Ser (mg/dL)  Date Value  07/02/2021 6.63 (H)  07/01/2021 6.58 (H)  06/04/2021 5.28 (H)  05/21/2021 4.40 (H)  05/19/2021 5.00 (H)  05/19/2021 4.60 (H)  ]  ROS Balance of 12 systems is negative w/ exceptions as above  PMH  Past Medical History:  Diagnosis Date   DM2 (diabetes mellitus, type 2) (HCC)    HTN (hypertension)    [place] History reviewed. No pertinent surgical history. FH  Family History  Problem Relation Age of Onset   Stroke Mother    SH  reports that he has never smoked. He has never used smokeless tobacco. He reports current alcohol use. He reports that he does not use drugs. Allergies No Known Allergies Home medications Prior to Admission medications   Medication Sig Start Date End  Date Taking? Authorizing Provider  acetaminophen (TYLENOL) 325 MG tablet Take 2 tablets (650 mg total) by mouth every 4 (four) hours as needed for mild pain (or temp > 37.5 C (99.5 F)). 05/21/21  Yes Sheikh, Omair Latif, DO  amLODipine (NORVASC) 10 MG tablet Take 1 tablet (10 mg total) by mouth daily. 05/21/21 08/16/21 Yes Sheikh, Omair Latif, DO  atorvastatin (LIPITOR) 80 MG tablet Take 1 tablet (80 mg total) by mouth daily. Patient taking differently: Take 80 mg by mouth every evening. 05/22/21  Yes Sheikh, Omair Latif, DO  clopidogrel (PLAVIX) 75 MG tablet Take 1 tablet (75 mg total) by mouth daily. 05/22/21  Yes Sheikh, Omair Latif, DO  fenofibrate 160 MG tablet Take 1 tablet (160 mg total) by mouth daily. Patient taking differently: Take 160 mg by mouth every evening. 05/21/21  Yes Sheikh, Omair Latif, DO  glipiZIDE (GLUCOTROL) 5 MG tablet Take 1 tablet (5 mg total) by mouth daily. 05/21/21 05/21/22 Yes Sheikh, Omair Latif, DO  hydrOXYzine (ATARAX/VISTARIL) 25 MG tablet Take 25 mg by mouth every evening. 03/30/21  Yes [provider]  loratadine (CLARITIN) 10 MG tablet Take 10 mg by mouth daily as needed for allergies.   Yes [provider]  multivitamin (ONE-A-DAY MEN'S) TABS tablet Take 1 tablet by mouth daily.   Yes [provider]  pantoprazole (PROTONIX) 40 MG tablet Take 1 tablet (40 mg total) by mouth daily. 05/22/21  Yes Sheikh, Omair Latif, DO  VICTOZA 18 MG/3ML SOPN Inject 1.8 mg into the skin daily. 05/07/21  Yes [provider]  zolpidem (AMBIEN) 10 MG tablet  Take 5-10 mg by mouth at bedtime as needed for sleep. 11/28/20  Yes [provider]    Current Medications Scheduled Meds:  amLODipine  10 mg Oral Daily   atorvastatin  80 mg Oral Daily   fenofibrate  160 mg Oral Daily   insulin aspart  0-6 Units Subcutaneous Q4H   insulin glargine-yfgn  10 Units Subcutaneous Daily   vancomycin variable dose per unstable renal function (pharmacist dosing)    Does not apply See admin instructions   Continuous Infusions:  sodium chloride 150 mL/hr at 07/02/21 0559   piperacillin-tazobactam (ZOSYN)  IV     PRN Meds:.acetaminophen **OR** acetaminophen, fentaNYL (SUBLIMAZE) injection  CBC Recent Labs  Lab 07/01/21 2055 07/02/21 0445  WBC 26.6* 26.7*  NEUTROABS 25.5* 22.1*  HGB 8.4* 9.2*  HCT 25.7* 27.4*  MCV 84.8 84.3  PLT 411* [id number]*   Basic Metabolic Panel Recent Labs  Lab 07/01/21 2055 07/02/21 0445  NA 128* 130*  K 4.1 4.1  CL 95* 93*  CO2 17* 21*  GLUCOSE 254* 178*  BUN 60* 61*  CREATININE 6.58* 6.63*  CALCIUM 9.1 9.9    Physical Exam  Blood pressure 134/75, pulse 100, temperature 99.2 F (37.3 C), temperature source Oral, resp. rate (!) 21, height '6\' 2"'$  (1.88 m), weight 122 kg, SpO2 95 %. GEN: NAD ENT: NCAT EYES: EOMI CV: Regular, normal S1 and S2 PULM: Clear bilaterally, normal work of breathing ABD: Soft, nontender SKIN: No rashes or lesions EXT: No edema, deferred evaluation of left foot  Assessment 64M CKD5 2/2 DKD admitted with osteomyelitis and necrotizing infection of left foot; has worsened creatinine/GFR compared to recent baseline.  Could be progression or some acute component in the setting of current issues.  He is not uremic.  No immediate indication for dialysis initiation.  CKD 5, recent worsening in creatinine, unclear if acute or progressive.  Biopsy-proven DKD.  No immediate indication to initiate dialysis but patient is aware of its likelihood in the future. Left fifth metatarsal head osteomyelitis and likely necrotizing infection/cellulitis, on vancomycin and Zosyn, orthopedics following and likely to receive ray amputation Hypertension: On amlodipine, monitor Anemia: Mild, normocytic, likely related to #1 and #2, trend for now Mild hyponatremia, limit free water intake, trend Leukocytosis secondary to #1  Plan As above, would only continue IVFs for the next 24 hours No indications for  RRT Continue supportive care, will follow along Daily weights, Daily Renal Panel, Strict I/Os, Avoid nephrotoxins (NSAIDs, judicious IV Contrast)   Bruce Little  07/02/2021, 1:39 PM

## 2021-07-02 NOTE — Progress Notes (Addendum)
Pharmacy Antibiotic Note  Bruce Little is a 50 y.o. male admitted on 07/01/2021 with cellulitis.  Pharmacy has been consulted for vancomycin dosing.  Plan: Vancomycin 2gm IV x 1 then dose based on random levels F/u cultures and clinical course  Height: '6\' 2"'$  (188 cm) Weight: 122 kg (268 lb 15.4 oz) IBW/kg (Calculated) : 82.2  Temp (24hrs), Avg:100 F (37.8 C), Min:99.6 F (37.6 C), Max:100.3 F (37.9 C)  Recent Labs  Lab 07/01/21 2055  WBC 26.6*  CREATININE 6.58*  LATICACIDVEN 1.2    Estimated Creatinine Clearance: 18.8 mL/min (A) (by C-G formula based on SCr of 6.58 mg/dL (H)).    No Known Allergies  Thank you for allowing pharmacy to be a part of this patient's care.  Excell Seltzer Poteet 07/02/2021 5:08 AM  ADDENDUM (0600) Adding Zosyn  Zosyn 2.25gm IV q8h Will f/u renal function, micro data, and pt's clinical condition  Sherlon Handing, PharmD, BCPS Please see amion for complete clinical pharmacist phone list 07/02/2021 5:56 AM

## 2021-07-02 NOTE — Sepsis Progress Note (Signed)
Following per sepsis protocol   

## 2021-07-02 NOTE — ED Notes (Addendum)
Pt has 1+ left pedal pulse, warm to touch, pt able to wiggle toes. Pt states he's unable to feel me touching foot. Left anterior of foot and toes are erythematous. The left 5th digit is ecchymotic. The anterior area next to the 5th digit is necrotic. The 3rd digit has 1+ swelling. The left 4th and 5th digit has 3+ swelling. Pt has a growth on the side of the 5th digit. The toes have a very foul smell to them.

## 2021-07-02 NOTE — Progress Notes (Signed)
Cleansed left foot with saline, applied xeroform to outer aspect of the foot, wrapped in kerlix, and ace wrap. Foot has a foul odor.

## 2021-07-02 NOTE — Consult Note (Signed)
Reason for Consult:Foot ulcer Referring Physician: Oren Binet Time called: 0730 Time at bedside: Bruce Little is an 50 y.o. male.  HPI: Bruce Little was at a pool about 10d ago and suffered some abrasions on his left foot from the rough concrete on the pool deck. He had been treating this at home with Aquacel dressings and it seemed to be improving but over the weekend the wound on the lateral side of his foot got much worse. He saw his PCP who sent him to the ED for further evaluation. He c/o pain in that foot but denies fevers, chills, sweats, N/V. He had a previous ulceration 10y ago that he was able to treat successfully.  Past Medical History:  Diagnosis Date   DM2 (diabetes mellitus, type 2) ([place])    HTN (hypertension)     History reviewed. No pertinent surgical history.  Family History  Problem Relation Age of Onset   Stroke Mother     Social History:  reports that he has never smoked. He has never used smokeless tobacco. He reports current alcohol use. He reports that he does not use drugs.  Allergies: No Known Allergies  Medications: I have reviewed the patient's current medications.  Results for orders placed or performed during the hospital encounter of 07/01/21 (from the past 48 hour(s))  Urinalysis, Routine w reflex microscopic Urine, Clean Catch     Status: Abnormal   Collection Time: 07/01/21  8:36 PM  Result Value Ref Range   Color, Urine YELLOW YELLOW   APPearance HAZY (A) CLEAR   Specific Gravity, Urine 1.016 1.005 - 1.030   pH 5.0 5.0 - 8.0   Glucose, UA 150 (A) NEGATIVE mg/dL   Hgb urine dipstick MODERATE (A) NEGATIVE   Bilirubin Urine NEGATIVE NEGATIVE   Ketones, ur NEGATIVE NEGATIVE mg/dL   Protein, ur 100 (A) NEGATIVE mg/dL   Nitrite NEGATIVE NEGATIVE   Leukocytes,Ua NEGATIVE NEGATIVE   RBC / HPF 0-5 0 - 5 RBC/hpf   WBC, UA 0-5 0 - 5 WBC/hpf   Bacteria, UA RARE (A) NONE SEEN   Squamous Epithelial / LPF 0-5 0 - 5   Mucus PRESENT    Sperm, UA  PRESENT     Comment: Performed at [facility], [address] [place], [place] [address]  Blood Culture (routine x 2)     Status: None (Preliminary result)   Collection Time: 07/01/21  8:45 PM   Specimen: BLOOD  Result Value Ref Range   Specimen Description BLOOD RIGHT ANTECUBITAL    Special Requests      BOTTLES DRAWN AEROBIC AND ANAEROBIC Blood Culture adequate volume   Culture      NO GROWTH < 12 HOURS Performed at [facility], [place] [address] [place], [place] [address]    Report Status PENDING   Lactic acid, plasma     Status: None   Collection Time: 07/01/21  8:55 PM  Result Value Ref Range   Lactic Acid, Venous 1.2 0.5 - 1.9 mmol/L    Comment: Performed at [facility] [address], [place], [place] [address]  Comprehensive metabolic panel     Status: Abnormal   Collection Time: 07/01/21  8:55 PM  Result Value Ref Range   Sodium 128 (L) 135 - 145 mmol/L   Potassium 4.1 3.5 - 5.1 mmol/L   Chloride 95 (L) 98 - 111 mmol/L   CO2 17 (L) 22 - 32 mmol/L   Glucose, Bld  254 (H) 70 - 99 mg/dL    Comment: Glucose reference range applies only to samples taken after fasting for at least 8 hours.   BUN 60 (H) 6 - 20 mg/dL   Creatinine, Ser 6.58 (H) 0.61 - 1.24 mg/dL   Calcium 9.1 8.9 - 10.3 mg/dL   Total Protein 8.0 6.5 - 8.1 g/dL   Albumin 2.7 (L) 3.5 - 5.0 g/dL   AST 80 (H) 15 - 41 U/L   ALT 72 (H) 0 - 44 U/L   Alkaline Phosphatase 82 38 - 126 U/L   Total Bilirubin 0.5 0.3 - 1.2 mg/dL   GFR, Estimated 10 (L) >60 mL/min    Comment: (NOTE) Calculated using the CKD-EPI Creatinine Equation (2021)    Anion gap 16 (H) 5 - 15    Comment: Performed at [facility], [place] [address], [place], [place] [address]  CBC WITH DIFFERENTIAL     Status: Abnormal   Collection Time: 07/01/21  8:55 PM  Result Value Ref Range   WBC 26.6 (H) 4.0 - 10.5 K/uL   RBC 3.03 (L) 4.22 - 5.81 MIL/uL   Hemoglobin 8.4 (L) 13.0 - 17.0 g/dL   HCT 25.7 (L) 39.0 - 52.0 %   MCV 84.8  80.0 - 100.0 fL   MCH 27.7 26.0 - 34.0 pg   MCHC 32.7 30.0 - 36.0 g/dL   RDW 12.2 11.5 - 15.5 %   Platelets 411 (H) 150 - 400 K/uL   nRBC 0.0 0.0 - 0.2 %   Neutrophils Relative % 96 %   Neutro Abs 25.5 (H) 1.7 - 7.7 K/uL   Lymphocytes Relative 0 %   Lymphs Abs 0.0 (L) 0.7 - 4.0 K/uL   Monocytes Relative 4 %   Monocytes Absolute 1.1 (H) 0.1 - 1.0 K/uL   Eosinophils Relative 0 %   Eosinophils Absolute 0.0 0.0 - 0.5 K/uL   Basophils Relative 0 %   Basophils Absolute 0.0 0.0 - 0.1 K/uL   nRBC 0 0 /100 WBC   Abs Immature Granulocytes 0.00 0.00 - 0.07 K/uL    Comment: Performed at [facility], [place] [address] [place], [place] [address]  Protime-INR     Status: Abnormal   Collection Time: 07/01/21  8:55 PM  Result Value Ref Range   Prothrombin Time 18.2 (H) 11.4 - 15.2 seconds   INR 1.5 (H) 0.8 - 1.2    Comment: (NOTE) INR goal varies based on device and disease states. Performed at [facility], [place] [address] [place], [place] [address]   APTT     Status: None   Collection Time: 07/01/21  8:55 PM  Result Value Ref Range   aPTT 34 24 - 36 seconds    Comment: Performed at [facility] [address], [place], [place] [address]  Blood Culture (routine x 2)     Status: None (Preliminary result)   Collection Time: 07/01/21  8:55 PM   Specimen: BLOOD  Result Value Ref Range   Specimen Description BLOOD LEFT ANTECUBITAL    Special Requests      BOTTLES DRAWN AEROBIC AND ANAEROBIC Blood Culture adequate volume   Culture      NO GROWTH < 12 HOURS Performed at [facility], [place] [address] [place], [place] [address]    Report Status PENDING   Resp Panel by RT-PCR (Flu A&B, Covid) Nasopharyngeal Swab     Status: None   Collection Time: 07/02/21 12:51  AM   Specimen: Nasopharyngeal Swab; Nasopharyngeal(NP) swabs in vial transport medium  Result Value Ref Range   SARS Coronavirus 2 by RT PCR NEGATIVE NEGATIVE    Comment: (NOTE) SARS-CoV-2 target nucleic  acids are NOT DETECTED.  The SARS-CoV-2 RNA is generally detectable in upper respiratory specimens during the acute phase of infection. The lowest concentration of SARS-CoV-2 viral copies this assay can detect is 138 copies/mL. A negative result does not preclude SARS-Cov-2 infection and should not be used as the sole basis for treatment or other patient management decisions. A negative result may occur with  improper specimen collection/handling, submission of specimen other than nasopharyngeal swab, presence of viral mutation(s) within the areas targeted by this assay, and inadequate number of viral copies(<138 copies/mL). A negative result must be combined with clinical observations, patient history, and epidemiological information. The expected result is Negative.  Fact Sheet for Patients:  EntrepreneurPulse.com.au  Fact Sheet for Healthcare Providers:  IncredibleEmployment.be  This test is no t yet approved or cleared by the [place] FDA and  has been authorized for detection and/or diagnosis of SARS-CoV-2 by FDA under an Emergency Use Authorization (EUA). This EUA will remain  in effect (meaning this test can be used) for the duration of the COVID-19 declaration under Section 564(b)(1) of the Act, 21 U.S.C.section 360bbb-3(b)(1), unless the authorization is terminated  or revoked sooner.       Influenza A by PCR NEGATIVE NEGATIVE   Influenza B by PCR NEGATIVE NEGATIVE    Comment: (NOTE) The Xpert Xpress SARS-CoV-2/FLU/RSV plus assay is intended as an aid in the diagnosis of influenza from Nasopharyngeal swab specimens and should not be used as a sole basis for treatment. Nasal washings and aspirates are unacceptable for Xpert Xpress SARS-CoV-2/FLU/RSV testing.  Fact Sheet for Patients: EntrepreneurPulse.com.au  Fact Sheet for Healthcare Providers: IncredibleEmployment.be  This test is not  yet approved or cleared by the [place] FDA and has been authorized for detection and/or diagnosis of SARS-CoV-2 by FDA under an Emergency Use Authorization (EUA). This EUA will remain in effect (meaning this test can be used) for the duration of the COVID-19 declaration under Section 564(b)(1) of the Act, 21 U.S.C. section 360bbb-3(b)(1), unless the authorization is terminated or revoked.  Performed at [facility], [place] [address] [place], [place] [address]   Lactic acid, plasma     Status: None   Collection Time: 07/02/21  4:45 AM  Result Value Ref Range   Lactic Acid, Venous 1.1 0.5 - 1.9 mmol/L    Comment: Performed at [facility] [address], [place], [place] [address]  Hemoglobin A1c     Status: Abnormal   Collection Time: 07/02/21  4:45 AM  Result Value Ref Range   Hgb A1c MFr Bld 8.4 (H) 4.8 - 5.6 %    Comment: (NOTE) Pre diabetes:          5.7%-6.4%  Diabetes:              >6.4%  Glycemic control for   <7.0% adults with diabetes    Mean Plasma Glucose 194.38 mg/dL    Comment: Performed at [facility] [address], [place], [place] [id number]  Basic metabolic panel     Status: Abnormal   Collection Time: 07/02/21  4:45 AM  Result Value Ref Range   Sodium 130 (L) 135 - 145 mmol/L   Potassium 4.1 3.5 - 5.1 mmol/L   Chloride 93 (L) 98 - 111  mmol/L   CO2 21 (L) 22 - 32 mmol/L   Glucose, Bld 178 (H) 70 - 99 mg/dL    Comment: Glucose reference range applies only to samples taken after fasting for at least 8 hours.   BUN 61 (H) 6 - 20 mg/dL   Creatinine, Ser 6.63 (H) 0.61 - 1.24 mg/dL   Calcium 9.9 8.9 - 10.3 mg/dL   GFR, Estimated 10 (L) >60 mL/min    Comment: (NOTE) Calculated using the CKD-EPI Creatinine Equation (2021)    Anion gap 16 (H) 5 - 15    Comment: Performed at [facility] [address] [place], [place] [address]  Hepatic function panel     Status: Abnormal   Collection Time: 07/02/21  4:45 AM  Result Value Ref  Range   Total Protein 8.4 (H) 6.5 - 8.1 g/dL   Albumin 2.7 (L) 3.5 - 5.0 g/dL   AST 94 (H) 15 - 41 U/L   ALT 82 (H) 0 - 44 U/L   Alkaline Phosphatase 90 38 - 126 U/L   Total Bilirubin 0.8 0.3 - 1.2 mg/dL   Bilirubin, Direct <0.1 0.0 - 0.2 mg/dL   Indirect Bilirubin NOT CALCULATED 0.3 - 0.9 mg/dL    Comment: Performed at [facility] [address] [place], [place] [address]  CBC WITH DIFFERENTIAL     Status: Abnormal   Collection Time: 07/02/21  4:45 AM  Result Value Ref Range   WBC 26.7 (H) 4.0 - 10.5 K/uL   RBC 3.25 (L) 4.22 - 5.81 MIL/uL   Hemoglobin 9.2 (L) 13.0 - 17.0 g/dL   HCT 27.4 (L) 39.0 - 52.0 %   MCV 84.3 80.0 - 100.0 fL   MCH 28.3 26.0 - 34.0 pg   MCHC 33.6 30.0 - 36.0 g/dL   RDW 12.2 11.5 - 15.5 %   Platelets 435 (H) 150 - 400 K/uL   nRBC 0.0 0.0 - 0.2 %   Neutrophils Relative % 83 %   Neutro Abs 22.1 (H) 1.7 - 7.7 K/uL   Lymphocytes Relative 7 %   Lymphs Abs 1.8 0.7 - 4.0 K/uL   Monocytes Relative 9 %   Monocytes Absolute 2.4 (H) 0.1 - 1.0 K/uL   Eosinophils Relative 0 %   Eosinophils Absolute 0.1 0.0 - 0.5 K/uL   Basophils Relative 0 %   Basophils Absolute 0.1 0.0 - 0.1 K/uL   WBC Morphology MORPHOLOGY UNREMARKABLE    RBC Morphology MORPHOLOGY UNREMARKABLE    Smear Review MORPHOLOGY UNREMARKABLE    Immature Granulocytes 1 %   Abs Immature Granulocytes 0.25 (H) 0.00 - 0.07 K/uL    Comment: Performed at [facility], [place] [address], [place], [place] [address]  CBG monitoring, ED     Status: Abnormal   Collection Time: 07/02/21  6:04 AM  Result Value Ref Range   Glucose-Capillary 168 (H) 70 - 99 mg/dL    Comment: Glucose reference range applies only to samples taken after fasting for at least 8 hours.  Lactic acid, plasma     Status: None   Collection Time: 07/02/21  6:16 AM  Result Value Ref Range   Lactic Acid, Venous 0.7 0.5 - 1.9 mmol/L    Comment: Performed at [facility] [address] [place], [place] [address]  Type and  screen [place]     Status: None   Collection Time: 07/02/21  6:16 AM  Result Value Ref Range  ABO/RH(D) B POS    Antibody Screen NEG    Sample Expiration      07/05/2021,2359 Performed at [facility], [place] [address] [place], [place] [address]   ABO/Rh     Status: None   Collection Time: 07/02/21  6:25 AM  Result Value Ref Range   ABO/RH(D)      B POS Performed at [facility] [address], [place], [place] [address]     DG Chest 1 View  Result Date: 07/01/2021 CLINICAL DATA:  Sepsis.  Foot infection. EXAM: CHEST  1 VIEW COMPARISON:  None. FINDINGS: Low lung volumes.The cardiomediastinal contours are normal. The lungs are clear. Pulmonary vasculature is normal. No consolidation, pleural effusion, or pneumothorax. No acute osseous abnormalities are seen. IMPRESSION: Low lung volumes without acute chest finding. Electronically Signed   By: Keith Rake M.D.   On: 07/01/2021 21:34   MR FOOT LEFT WO CONTRAST  Result Date: 07/02/2021 CLINICAL DATA:  Left foot pain for few days. Wound along the plantar aspect of the left foot. EXAM: MRI OF THE LEFT FOOT WITHOUT CONTRAST TECHNIQUE: Multiplanar, multisequence MR imaging of the left foot was performed. No intravenous contrast was administered. COMPARISON:  None. FINDINGS: Bones/Joint/Cartilage Wound along the plantar aspect of the fifth metatarsal head. Severe bone marrow edema in the fifth metatarsal head. Small fifth MTP joint effusion. Bone marrow edema in the plantar aspect of the fourth metatarsal head which may be reactive versus secondary to early osteomyelitis. Normal alignment. No joint effusion. No acute fracture or dislocation. Ligaments Collateral ligaments are intact.  Lisfranc ligament is intact. Muscles and Tendons Flexor, peroneal and extensor compartment tendons are intact. T2 hyperintensity throughout the plantar musculature likely neurogenic. Soft tissue No fluid collection or hematoma. No  soft tissue mass. Severe soft tissue edema involving the lateral aspect of the fifth metatarsal around the soft tissue wound with soft tissue emphysema as can be seen with a necrotizing infection. 7 mm fluid collection in the subcutaneous fat along the dorsal lateral aspect of the fifth metatarsal shaft concerning for a tiny abscess. IMPRESSION: 1. Wound along the plantar aspect of the fifth metatarsal head. Severe bone marrow edema in the fifth metatarsal head concerning for osteomyelitis. Small fifth MTP joint effusion which may be reactive versus secondary to septic arthritis. 2. Cellulitis along the lateral aspect of the forefoot with air seen within the soft tissues around the fifth MTP joint concerning for necrotizing infection. 3. Bone marrow edema in the plantar aspect of the fourth metatarsal head which may be reactive versus secondary to early osteomyelitis. Electronically Signed   By: Kathreen Devoid M.D.   On: 07/02/2021 10:00   DG Foot Complete Left  Result Date: 07/01/2021 CLINICAL DATA:  Questionable sepsis. EXAM: LEFT FOOT - COMPLETE 3+ VIEW COMPARISON:  None. FINDINGS: There is no acute fracture or dislocation. The bones are mildly osteopenic. No bone erosion or periosteal elevation to suggest osteomyelitis. There is soft tissue swelling of the forefoot. There is soft tissue gas centered at the fifth MTP joint consistent with gas-forming infectious/necrotizing fasciitis. MRI may provide better evaluation if there is clinical concern for acute osteomyelitis. IMPRESSION: 1. No acute fracture or dislocation. 2. Soft tissue gas centered at the fifth MTP joint. No radiographic evidence of osteomyelitis. Electronically Signed   By: Anner Crete M.D.   On: 07/01/2021 21:36    Review of Systems  Constitutional:  Negative for chills, diaphoresis and fever.  HENT:  Negative for  ear discharge, ear pain, hearing loss and tinnitus.   Eyes:  Negative for photophobia and pain.  Respiratory:  Negative  for cough and shortness of breath.   Cardiovascular:  Negative for chest pain.  Gastrointestinal:  Negative for abdominal pain, nausea and vomiting.  Genitourinary:  Negative for dysuria, flank pain, frequency and urgency.  Musculoskeletal:  Positive for arthralgias (Left foot). Negative for back pain, myalgias and neck pain.  Neurological:  Negative for dizziness and headaches.  Hematological:  Does not bruise/bleed easily.  Psychiatric/Behavioral:  The patient is not nervous/anxious.   Blood pressure (!) 158/94, pulse (!) 102, temperature 99.2 F (37.3 C), temperature source Oral, resp. rate (!) 21, height '6\' 2"'$  (1.88 m), weight 122 kg, SpO2 97 %. Physical Exam Constitutional:      General: He is not in acute distress.    Appearance: He is well-developed. He is not diaphoretic.  HENT:     Head: Normocephalic and atraumatic.  Eyes:     General: No scleral icterus.       Right eye: No discharge.        Left eye: No discharge.     Conjunctiva/sclera: Conjunctivae normal.  Cardiovascular:     Rate and Rhythm: Normal rate and regular rhythm.  Pulmonary:     Effort: Pulmonary effort is normal. No respiratory distress.  Musculoskeletal:     Cervical back: Normal range of motion.     Comments: Necrotic ulceration over 5th MTP joint, foul odor. Surrounding erythema. 2+ DP, 0 PT. DPN/TN paresthetic, SPN absent.  Skin:    General: Skin is warm and dry.  Neurological:     Mental Status: He is alert.  Psychiatric:        Mood and Affect: Mood normal.        Behavior: Behavior normal.    Assessment/Plan: Left foot ulceration -- Given osteo on MRI will need 5th ray amputation. Likely tomorrow with Dr. Erlinda Hong.    Lisette Abu, PA-C Orthopedic Surgery [phone] 07/02/2021, 10:21 AM

## 2021-07-02 NOTE — ED Provider Notes (Signed)
[facility] EMERGENCY DEPARTMENT Provider Note   CSN: [id number] Arrival date & time: 07/01/21  1814     History Chief Complaint  Patient presents with   Wound Infection    Bruce Little is a 50 y.o. male.  HPI     This is a 50 year old male with history of diabetes, hypertension, renal failure who presents with a wound infection.  Patient reports that he has had a wound on his foot.  He states approximately month ago he noted a blister on his foot after getting out of the pool.  He has been dressing at home.  Since Sunday he has noted increasing pain, redness, and purulence from the left fifth digit.  He also has noted a foul smell.  He reports his blood sugars have been under control.  He reports chills and he was febrile to 100.3 upon arrival.  Denies chest pain, shortness of breath.  No known sick contacts or COVID exposures.  Past Medical History:  Diagnosis Date   DM2 (diabetes mellitus, type 2) ([place])    HTN (hypertension)     Patient Active Problem List   Diagnosis Date Noted   Renal insufficiency 05/20/2021   DM2 (diabetes mellitus, type 2) ([place]) 05/20/2021   Acute ischemic stroke ([employer]) 05/20/2021   HTN (hypertension) 05/20/2021    No past surgical history on file.     Family History  Problem Relation Age of Onset   Stroke Mother     Social History   Tobacco Use   Smoking status: Never  Substance Use Topics   Alcohol use: Yes    Comment: occ   Drug use: Never    Home Medications Prior to Admission medications   Medication Sig Start Date End Date Taking? Authorizing Provider  acetaminophen (TYLENOL) 325 MG tablet Take 2 tablets (650 mg total) by mouth every 4 (four) hours as needed for mild pain (or temp > 37.5 C (99.5 F)). 05/21/21   Sheikh, Omair Latif, DO  amLODipine (NORVASC) 10 MG tablet Take 1 tablet (10 mg total) by mouth daily. 05/21/21 06/20/21  Raiford Noble Latif, DO  atorvastatin (LIPITOR) 80 MG tablet Take 1 tablet (80 mg  total) by mouth daily. 05/22/21   Raiford Noble Latif, DO  clopidogrel (PLAVIX) 75 MG tablet Take 1 tablet (75 mg total) by mouth daily. 05/22/21   Raiford Noble Latif, DO  fenofibrate 160 MG tablet Take 1 tablet (160 mg total) by mouth daily. 05/21/21   Sheikh, Omair Latif, DO  glipiZIDE (GLUCOTROL) 5 MG tablet Take 1 tablet (5 mg total) by mouth daily. 05/21/21 05/21/22  Raiford Noble Latif, DO  hydrocortisone cream 1 % Apply 1 application topically 2 (two) times daily as needed for itching (rash).    [provider]  hydrOXYzine (ATARAX/VISTARIL) 25 MG tablet Take 12.5-25 mg by mouth daily as needed for anxiety. 03/30/21   [provider]  loratadine (CLARITIN) 10 MG tablet Take 10 mg by mouth daily as needed for allergies.    [provider]  multivitamin (ONE-A-DAY MEN'S) TABS tablet Take 1 tablet by mouth daily.    [provider]  pantoprazole (PROTONIX) 40 MG tablet Take 1 tablet (40 mg total) by mouth daily. 05/22/21   Sheikh, Omair Latif, DO  VICTOZA 18 MG/3ML SOPN Inject 1.8 mg into the skin daily. 05/07/21   [provider]  zolpidem (AMBIEN) 10 MG tablet Take 5-10 mg by mouth at bedtime as needed for sleep. 11/28/20   [provider]    Allergies    Patient has no known allergies.  Review of Systems   Review of Systems  Constitutional:  Positive for chills and fever.  Respiratory:  Negative for shortness of breath.   Cardiovascular:  Negative for chest pain.  Gastrointestinal:  Negative for abdominal pain.  Musculoskeletal:        Toe pain  Skin:  Positive for color change and wound.  All other systems reviewed and are negative.  Physical Exam Updated Vital Signs BP 112/84 (BP Location: Left Arm)   Pulse (!) 104   Temp 100.3 F (37.9 C) (Oral)   Resp 16   Ht 1.88 m ('6\' 2"'$ )   Wt 122 kg   SpO2 98%   BMI 34.53 kg/m   Physical Exam Vitals and nursing note reviewed.  Constitutional:      Appearance: He is well-developed.  He is obese. He is not ill-appearing.  HENT:     Head: Normocephalic and atraumatic.     Mouth/Throat:     Mouth: Mucous membranes are moist.  Eyes:     Pupils: Pupils are equal, round, and reactive to light.  Cardiovascular:     Rate and Rhythm: Normal rate and regular rhythm.     Heart sounds: Normal heart sounds. No murmur heard. Pulmonary:     Effort: Pulmonary effort is normal. No respiratory distress.     Breath sounds: Normal breath sounds. No wheezing.  Abdominal:     General: Bowel sounds are normal.     Palpations: Abdomen is soft.     Tenderness: There is no abdominal tenderness. There is no rebound.  Musculoskeletal:     Cervical back: Neck supple.     Comments: Focused examination of the left foot with swelling and tenderness noted mostly over the fifth digit but extending into the fourth digit, there is necrosis and crepitus noted, erythema extending proximally into the midfoot with associated foot swelling, desquamation of the tissue on the plantar aspect of the foot, 1+ DP pulse  Lymphadenopathy:     Cervical: No cervical adenopathy.  Skin:    General: Skin is warm and dry.  Neurological:     Mental Status: He is alert and oriented to person, place, and time.  Psychiatric:        Mood and Affect: Mood normal.     ED Results / Procedures / Treatments   Labs (all labs ordered are listed, but only abnormal results are displayed) Labs Reviewed  COMPREHENSIVE METABOLIC PANEL - Abnormal; Notable for the following components:      Result Value   Sodium 128 (*)    Chloride 95 (*)    CO2 17 (*)    Glucose, Bld 254 (*)    BUN 60 (*)    Creatinine, Ser 6.58 (*)    Albumin 2.7 (*)    AST 80 (*)    ALT 72 (*)    GFR, Estimated 10 (*)    Anion gap 16 (*)    All other components within normal limits  CBC WITH DIFFERENTIAL/PLATELET - Abnormal; Notable for the following components:   WBC 26.6 (*)    RBC 3.03 (*)    Hemoglobin 8.4 (*)    HCT 25.7 (*)    Platelets  411 (*)    Neutro Abs 25.5 (*)    Lymphs Abs 0.0 (*)    Monocytes Absolute 1.1 (*)    All other components within normal limits  PROTIME-INR - Abnormal; Notable  for the following components:   Prothrombin Time 18.2 (*)    INR 1.5 (*)    All other components within normal limits  URINALYSIS, ROUTINE W REFLEX MICROSCOPIC - Abnormal; Notable for the following components:   APPearance HAZY (*)    Glucose, UA 150 (*)    Hgb urine dipstick MODERATE (*)    Protein, ur 100 (*)    Bacteria, UA RARE (*)    All other components within normal limits  RESP PANEL BY RT-PCR (FLU A&B, COVID) ARPGX2  URINE CULTURE  CULTURE, BLOOD (ROUTINE X 2)  CULTURE, BLOOD (ROUTINE X 2)  LACTIC ACID, PLASMA  APTT  LACTIC ACID, PLASMA    EKG EKG Interpretation  Date/Time:  Wednesday July 02 2021 04:55:51 EDT Ventricular Rate:  111 PR Interval:  158 QRS Duration: 100 QT Interval:  330 QTC Calculation: 449 R Axis:   42 Text Interpretation: Sinus tachycardia Confirmed by Thayer Jew [phone]) on 07/02/2021 4:59:40 AM  Radiology DG Chest 1 View  Result Date: 07/01/2021 CLINICAL DATA:  Sepsis.  Foot infection. EXAM: CHEST  1 VIEW COMPARISON:  None. FINDINGS: Low lung volumes.The cardiomediastinal contours are normal. The lungs are clear. Pulmonary vasculature is normal. No consolidation, pleural effusion, or pneumothorax. No acute osseous abnormalities are seen. IMPRESSION: Low lung volumes without acute chest finding. Electronically Signed   By: Keith Rake M.D.   On: 07/01/2021 21:34   DG Foot Complete Left  Result Date: 07/01/2021 CLINICAL DATA:  Questionable sepsis. EXAM: LEFT FOOT - COMPLETE 3+ VIEW COMPARISON:  None. FINDINGS: There is no acute fracture or dislocation. The bones are mildly osteopenic. No bone erosion or periosteal elevation to suggest osteomyelitis. There is soft tissue swelling of the forefoot. There is soft tissue gas centered at the fifth MTP joint consistent with  gas-forming infectious/necrotizing fasciitis. MRI may provide better evaluation if there is clinical concern for acute osteomyelitis. IMPRESSION: 1. No acute fracture or dislocation. 2. Soft tissue gas centered at the fifth MTP joint. No radiographic evidence of osteomyelitis. Electronically Signed   By: Anner Crete M.D.   On: 07/01/2021 21:36    Procedures .Critical Care  Date/Time: 07/02/2021 5:00 AM Performed by: Merryl Hacker, MD Authorized by: Merryl Hacker, MD   Critical care provider statement:    Critical care time (minutes):  45   Critical care was necessary to treat or prevent imminent or life-threatening deterioration of the following conditions:  Sepsis   Critical care was time spent personally by me on the following activities:  Discussions with consultants, evaluation of patient's response to treatment, examination of patient, ordering and performing treatments and interventions, ordering and review of laboratory studies, ordering and review of radiographic studies, pulse oximetry, re-evaluation of patient's condition, obtaining history from patient or surrogate and review of old charts   Medications Ordered in ED Medications  lactated ringers infusion (has no administration in time range)  vancomycin (VANCOREADY) IVPB 2000 mg/400 mL (has no administration in time range)  piperacillin-tazobactam (ZOSYN) IVPB 3.375 g (3.375 g Intravenous New Bag/Given 07/02/21 0458)    ED Course  I have reviewed the triage vital signs and the nursing notes.  Pertinent labs & imaging results that were available during my care of the patient were reviewed by me and considered in my medical decision making (see chart for details).  Clinical Course as of 07/02/21 0459  Wed Jul 02, 2021  0454 Spoke to Dr. Erlinda Hong.  He will evaluate the patient in consult.   [  CH]    Clinical Course User Index [CH] Leiloni Smithers, Barbette Hair, MD   MDM Rules/Calculators/A&P                           Patient  presents with wound infection.  Febrile to 100.3.  Slightly tachycardic.  On my evaluation, patient had been waiting in the waiting room approximately 10 hours.  He is ill-appearing but nontoxic.  Septic work-up was initiated.  Lactate was added to blood cultures.  He has a white count of 26.  He is not hypotensive.  We will start fluids, vancomycin, and Zosyn with concern for necrotic wound infection.  History of diabetes and renal insufficiency.  Creatinine today is 6.5.  Most recent 5.2.  He continues to make urine.  Spoke with orthopedics.  He will be evaluated in consult.  Will likely need at minimum debridement if not amputation.  We will plan for admission to the hospitalist for ongoing antibiotics and management.   Final Clinical Impression(s) / ED Diagnoses Final diagnoses:  Wound infection  Severe sepsis [facility])    Rx / DC Orders ED Discharge Orders     None        Levonia Wolfley, Barbette Hair, MD 07/02/21 0500

## 2021-07-02 NOTE — Progress Notes (Signed)
PROGRESS NOTE        PATIENT DETAILS Name: Bruce Little Age: 50 y.o. Sex: male Date of Birth: May 22, 1971 Admit Date: 07/01/2021 Admitting Physician Rise Patience, MD [id number], [facility]  Brief Narrative: Patient is a 50 y.o. male DM-2, CKD stage V, HTN-who presented with worsening left foot ulceration-found to have necrotizing left foot infection with underlying osteomyelitis and AKI on CKD stage V.  Significant events: 8/23>> admit for left foot necrotizing infection-osteomyelitis-AKI  Significant studies: 8/24>> MRI left foot: Osteomyelitis fifth metatarsal head-possible osteomyelitis of the fourth metatarsal head cellulitis with air in the forefoot-concern for necrotizing infection.  Antimicrobial therapy: Vancomycin: 8/23>> Zosyn: 8/23>>  Microbiology data: 8/23>> blood cultures: No growth  Procedures : None  Consults: Orthopedics  DVT Prophylaxis : SCDs Start: 07/02/21 0551   Subjective: Left foot-mostly distal aspect-swollen-foul-smelling odor.  No chest pain or shortness of breath.   Assessment/Plan: Sepsis due to left foot necrotizing infection with underlying acute osteomyelitis: Continue Vanco/Zosyn-blood cultures negative so far-orthopedics following with plans for amputation on 8/25.  AKI on CKD stage V: AKI likely hemodynamically mediated-avoid nephrotoxic agents.  Continue to hydrate-follow renal function closely.  We will get nephrology involved.  Recently had a renal biopsy done as well.  Recent CVA (July 2022): Antiplatelets on hold-plan to restart once orthopedic surgery has been done.  No focal deficits evident on exam.  DM-2 (A1c 8.4 on 8/24): CBGs relatively stable-continue Lantus 10 units-SSI-follow and adjust.  Recent Labs    07/02/21 0604 07/02/21 1026  GLUCAP 168* 149*    HTN: BP stable-continue amlodipine.  Normocytic anemia: Due to a combination of CKD stage V and acute illness.  Continue  to follow CBC.  Agree.  Obesity: Estimated body mass index is 34.53 kg/m as calculated from the following:   Height as of this encounter: '6\' 2"'$  (1.88 m).   Weight as of this encounter: 122 kg.    Diet: Diet Order             Diet NPO time specified Except for: Sips with Meds  Diet effective now                    Code Status: Full code  Family Communication: None at bedside.  Disposition Plan: Status is: Inpatient  Remains inpatient appropriate because:Inpatient level of care appropriate due to severity of illness  Dispo: The patient is from: Home              Anticipated d/c is to: Home              Patient currently is not medically stable to d/c.   Difficult to place patient No   Barriers to Discharge: Necrotizing left foot infection with acute osteomyelitis-on IV antibiotics-amputation scheduled for 8/25.  Antimicrobial agents: Anti-infectives (From admission, onward)    Start     Dose/Rate Route Frequency Ordered Stop   07/02/21 1400  piperacillin-tazobactam (ZOSYN) IVPB 2.25 g        2.25 g 100 mL/hr over 30 Minutes Intravenous Every 8 hours 07/02/21 0558     07/02/21 0600  vancomycin (VANCOCIN) IVPB 1000 mg/200 mL premix  Status:  Discontinued        1,000 mg 200 mL/hr over 60 Minutes Intravenous  Once 07/02/21 0553 07/02/21 0700   07/02/21 0507  vancomycin variable dose per  unstable renal function (pharmacist dosing)         Does not apply See admin instructions 07/02/21 0507     07/02/21 0500  vancomycin (VANCOREADY) IVPB 2000 mg/400 mL        2,000 mg 200 mL/hr over 120 Minutes Intravenous  Once 07/02/21 0446 07/02/21 0739   07/02/21 0500  piperacillin-tazobactam (ZOSYN) IVPB 3.375 g        3.375 g 12.5 mL/hr over 240 Minutes Intravenous Once 07/02/21 0446 07/02/21 0850        Time spent: 35 minutes-Greater than 50% of this time was spent in counseling, explanation of diagnosis, planning of further management, and coordination of  care.  MEDICATIONS: Scheduled Meds:  amLODipine  10 mg Oral Daily   atorvastatin  80 mg Oral Daily   fenofibrate  160 mg Oral Daily   insulin aspart  0-6 Units Subcutaneous Q4H   insulin glargine-yfgn  10 Units Subcutaneous Daily   vancomycin variable dose per unstable renal function (pharmacist dosing)   Does not apply See admin instructions   Continuous Infusions:  sodium chloride 150 mL/hr at 07/02/21 0559   piperacillin-tazobactam (ZOSYN)  IV     PRN Meds:.acetaminophen **OR** acetaminophen, fentaNYL (SUBLIMAZE) injection   PHYSICAL EXAM: Vital signs: Vitals:   07/02/21 0835 07/02/21 0841 07/02/21 1023 07/02/21 1100  BP: (!) 158/94  (!) 173/98 (!) 170/98  Pulse: (!) 102   (!) 105  Resp: (!) 21   19  Temp:  99.2 F (37.3 C)    TempSrc:  Oral    SpO2: 97%   96%  Weight:      Height:       Filed Weights   07/01/21 1936 07/02/21 0434  Weight: 111.1 kg 122 kg   Body mass index is 34.53 kg/m.   Gen Exam:Alert awake-not in any distress HEENT:atraumatic, normocephalic Chest: B/L clear to auscultation anteriorly CVS:S1S2 regular Abdomen:soft non tender, non distended Extremities:no edema-left foot-swollen-mostly in the lateral aspect distally-discoloration-foul-smelling discharge present. Neurology: Non focal Skin: no rash  I have personally reviewed following labs and imaging studies  LABORATORY DATA: CBC: Recent Labs  Lab 07/01/21 2055 07/02/21 0445  WBC 26.6* 26.7*  NEUTROABS 25.5* 22.1*  HGB 8.4* 9.2*  HCT 25.7* 27.4*  MCV 84.8 84.3  PLT 411* 435*    Basic Metabolic Panel: Recent Labs  Lab 07/01/21 2055 07/02/21 0445  NA 128* 130*  K 4.1 4.1  CL 95* 93*  CO2 17* 21*  GLUCOSE 254* 178*  BUN 60* 61*  CREATININE 6.58* 6.63*  CALCIUM 9.1 9.9    GFR: Estimated Creatinine Clearance: 18.7 mL/min (A) (by C-G formula based on SCr of 6.63 mg/dL (H)).  Liver Function Tests: Recent Labs  Lab 07/01/21 2055 07/02/21 0445  AST 80* 94*  ALT 72*  82*  ALKPHOS 82 90  BILITOT 0.5 0.8  PROT 8.0 8.4*  ALBUMIN 2.7* 2.7*   No results for input(s): LIPASE, AMYLASE in the last 168 hours. No results for input(s): AMMONIA in the last 168 hours.  Coagulation Profile: Recent Labs  Lab 07/01/21 2055  INR 1.5*    Cardiac Enzymes: No results for input(s): CKTOTAL, CKMB, CKMBINDEX, TROPONINI in the last 168 hours.  BNP (last 3 results) No results for input(s): PROBNP in the last 8760 hours.  Lipid Profile: No results for input(s): CHOL, HDL, LDLCALC, TRIG, CHOLHDL, LDLDIRECT in the last 72 hours.  Thyroid Function Tests: No results for input(s): TSH, T4TOTAL, FREET4, T3FREE, THYROIDAB in the last 72 hours.  Anemia Panel: No results for input(s): VITAMINB12, FOLATE, FERRITIN, TIBC, IRON, RETICCTPCT in the last 72 hours.  Urine analysis:    Component Value Date/Time   COLORURINE YELLOW 07/01/2021 2036   APPEARANCEUR HAZY (A) 07/01/2021 2036   LABSPEC 1.016 07/01/2021 2036   PHURINE 5.0 07/01/2021 2036   GLUCOSEU 150 (A) 07/01/2021 2036   HGBUR MODERATE (A) 07/01/2021 2036   BILIRUBINUR NEGATIVE 07/01/2021 2036   KETONESUR NEGATIVE 07/01/2021 2036   PROTEINUR 100 (A) 07/01/2021 2036   NITRITE NEGATIVE 07/01/2021 2036   LEUKOCYTESUR NEGATIVE 07/01/2021 2036    Sepsis Labs: Lactic Acid, Venous    Component Value Date/Time   LATICACIDVEN 0.7 07/02/2021 0616    MICROBIOLOGY: Recent Results (from the past 240 hour(s))  Blood Culture (routine x 2)     Status: None (Preliminary result)   Collection Time: 07/01/21  8:45 PM   Specimen: BLOOD  Result Value Ref Range Status   Specimen Description BLOOD RIGHT ANTECUBITAL  Final   Special Requests   Final    BOTTLES DRAWN AEROBIC AND ANAEROBIC Blood Culture adequate volume   Culture   Final    NO GROWTH < 12 HOURS Performed at [facility], [place] [address] [place], [place] [address]    Report Status PENDING  Incomplete  Blood Culture (routine x 2)     Status: None  (Preliminary result)   Collection Time: 07/01/21  8:55 PM   Specimen: BLOOD  Result Value Ref Range Status   Specimen Description BLOOD LEFT ANTECUBITAL  Final   Special Requests   Final    BOTTLES DRAWN AEROBIC AND ANAEROBIC Blood Culture adequate volume   Culture   Final    NO GROWTH < 12 HOURS Performed at [facility], [place] [address], [place], [place] [address]    Report Status PENDING  Incomplete  Resp Panel by RT-PCR (Flu A&B, Covid) Nasopharyngeal Swab     Status: None   Collection Time: 07/02/21 12:51 AM   Specimen: Nasopharyngeal Swab; Nasopharyngeal(NP) swabs in vial transport medium  Result Value Ref Range Status   SARS Coronavirus 2 by RT PCR NEGATIVE NEGATIVE Final    Comment: (NOTE) SARS-CoV-2 target nucleic acids are NOT DETECTED.  The SARS-CoV-2 RNA is generally detectable in upper respiratory specimens during the acute phase of infection. The lowest concentration of SARS-CoV-2 viral copies this assay can detect is 138 copies/mL. A negative result does not preclude SARS-Cov-2 infection and should not be used as the sole basis for treatment or other patient management decisions. A negative result may occur with  improper specimen collection/handling, submission of specimen other than nasopharyngeal swab, presence of viral mutation(s) within the areas targeted by this assay, and inadequate number of viral copies(<138 copies/mL). A negative result must be combined with clinical observations, patient history, and epidemiological information. The expected result is Negative.  Fact Sheet for Patients:  EntrepreneurPulse.com.au  Fact Sheet for Healthcare Providers:  IncredibleEmployment.be  This test is no t yet approved or cleared by the [place] FDA and  has been authorized for detection and/or diagnosis of SARS-CoV-2 by FDA under an Emergency Use Authorization (EUA). This EUA will remain  in effect (meaning this test  can be used) for the duration of the COVID-19 declaration under Section 564(b)(1) of the Act, 21 U.S.C.section 360bbb-3(b)(1), unless the authorization is terminated  or revoked sooner.       Influenza A by PCR NEGATIVE NEGATIVE Final   Influenza B by PCR NEGATIVE NEGATIVE Final  Comment: (NOTE) The Xpert Xpress SARS-CoV-2/FLU/RSV plus assay is intended as an aid in the diagnosis of influenza from Nasopharyngeal swab specimens and should not be used as a sole basis for treatment. Nasal washings and aspirates are unacceptable for Xpert Xpress SARS-CoV-2/FLU/RSV testing.  Fact Sheet for Patients: EntrepreneurPulse.com.au  Fact Sheet for Healthcare Providers: IncredibleEmployment.be  This test is not yet approved or cleared by the [place] FDA and has been authorized for detection and/or diagnosis of SARS-CoV-2 by FDA under an Emergency Use Authorization (EUA). This EUA will remain in effect (meaning this test can be used) for the duration of the COVID-19 declaration under Section 564(b)(1) of the Act, 21 U.S.C. section 360bbb-3(b)(1), unless the authorization is terminated or revoked.  Performed at [facility], [place] [address], [place], [place] [address]     RADIOLOGY STUDIES/RESULTS: DG Chest 1 View  Result Date: 07/01/2021 CLINICAL DATA:  Sepsis.  Foot infection. EXAM: CHEST  1 VIEW COMPARISON:  None. FINDINGS: Low lung volumes.The cardiomediastinal contours are normal. The lungs are clear. Pulmonary vasculature is normal. No consolidation, pleural effusion, or pneumothorax. No acute osseous abnormalities are seen. IMPRESSION: Low lung volumes without acute chest finding. Electronically Signed   By: Keith Rake M.D.   On: 07/01/2021 21:34   MR FOOT LEFT WO CONTRAST  Result Date: 07/02/2021 CLINICAL DATA:  Left foot pain for few days. Wound along the plantar aspect of the left foot. EXAM: MRI OF THE LEFT FOOT WITHOUT  CONTRAST TECHNIQUE: Multiplanar, multisequence MR imaging of the left foot was performed. No intravenous contrast was administered. COMPARISON:  None. FINDINGS: Bones/Joint/Cartilage Wound along the plantar aspect of the fifth metatarsal head. Severe bone marrow edema in the fifth metatarsal head. Small fifth MTP joint effusion. Bone marrow edema in the plantar aspect of the fourth metatarsal head which may be reactive versus secondary to early osteomyelitis. Normal alignment. No joint effusion. No acute fracture or dislocation. Ligaments Collateral ligaments are intact.  Lisfranc ligament is intact. Muscles and Tendons Flexor, peroneal and extensor compartment tendons are intact. T2 hyperintensity throughout the plantar musculature likely neurogenic. Soft tissue No fluid collection or hematoma. No soft tissue mass. Severe soft tissue edema involving the lateral aspect of the fifth metatarsal around the soft tissue wound with soft tissue emphysema as can be seen with a necrotizing infection. 7 mm fluid collection in the subcutaneous fat along the dorsal lateral aspect of the fifth metatarsal shaft concerning for a tiny abscess. IMPRESSION: 1. Wound along the plantar aspect of the fifth metatarsal head. Severe bone marrow edema in the fifth metatarsal head concerning for osteomyelitis. Small fifth MTP joint effusion which may be reactive versus secondary to septic arthritis. 2. Cellulitis along the lateral aspect of the forefoot with air seen within the soft tissues around the fifth MTP joint concerning for necrotizing infection. 3. Bone marrow edema in the plantar aspect of the fourth metatarsal head which may be reactive versus secondary to early osteomyelitis. Electronically Signed   By: Kathreen Devoid M.D.   On: 07/02/2021 10:00   DG Foot Complete Left  Result Date: 07/01/2021 CLINICAL DATA:  Questionable sepsis. EXAM: LEFT FOOT - COMPLETE 3+ VIEW COMPARISON:  None. FINDINGS: There is no acute fracture or  dislocation. The bones are mildly osteopenic. No bone erosion or periosteal elevation to suggest osteomyelitis. There is soft tissue swelling of the forefoot. There is soft tissue gas centered at the fifth MTP joint consistent with gas-forming infectious/necrotizing fasciitis. MRI may provide better evaluation  if there is clinical concern for acute osteomyelitis. IMPRESSION: 1. No acute fracture or dislocation. 2. Soft tissue gas centered at the fifth MTP joint. No radiographic evidence of osteomyelitis. Electronically Signed   By: Anner Crete M.D.   On: 07/01/2021 21:36     LOS: 0 days   Oren Binet, MD  [facility]    To contact the attending provider between 7A-7P or the covering provider during after hours 7P-7A, please log into the web site [email or web address] and access using universal [place] password for that web site. If you do not have the password, please call the hospital operator.  07/02/2021, 11:27 AM

## 2021-07-03 ENCOUNTER — Inpatient Hospital Stay (HOSPITAL_COMMUNITY): Payer: BC Managed Care – PPO | Admitting: Certified Registered"

## 2021-07-03 ENCOUNTER — Encounter (HOSPITAL_COMMUNITY): Payer: Self-pay | Admitting: Internal Medicine

## 2021-07-03 ENCOUNTER — Encounter (HOSPITAL_COMMUNITY)
Admission: EM | Disposition: A | Discharge status: Home Health | Payer: Self-pay | Source: Home / Self Care | Attending: Internal Medicine

## 2021-07-03 DIAGNOSIS — L089 Local infection of the skin and subcutaneous tissue, unspecified: Secondary | ICD-10-CM | POA: Diagnosis not present

## 2021-07-03 DIAGNOSIS — E11628 Type 2 diabetes mellitus with other skin complications: Secondary | ICD-10-CM | POA: Diagnosis not present

## 2021-07-03 DIAGNOSIS — I4891 Unspecified atrial fibrillation: Secondary | ICD-10-CM

## 2021-07-03 HISTORY — DX: Unspecified atrial fibrillation: I48.91

## 2021-07-03 HISTORY — PX: I & D EXTREMITY: SHX5045

## 2021-07-03 LAB — COMPREHENSIVE METABOLIC PANEL
ALT: 72 U/L — ABNORMAL HIGH (ref 0–44)
AST: 85 U/L — ABNORMAL HIGH (ref 15–41)
Albumin: 1.9 g/dL — ABNORMAL LOW (ref 3.5–5.0)
Alkaline Phosphatase: 76 U/L (ref 38–126)
Anion gap: 10 (ref 5–15)
BUN: 58 mg/dL — ABNORMAL HIGH (ref 6–20)
CO2: 17 mmol/L — ABNORMAL LOW (ref 22–32)
Calcium: 8.3 mg/dL — ABNORMAL LOW (ref 8.9–10.3)
Chloride: 100 mmol/L (ref 98–111)
Creatinine, Ser: 6.11 mg/dL — ABNORMAL HIGH (ref 0.61–1.24)
GFR, Estimated: 11 mL/min — ABNORMAL LOW (ref >60)
Glucose, Bld: 147 mg/dL — ABNORMAL HIGH (ref 70–99)
Potassium: 3.8 mmol/L (ref 3.5–5.1)
Sodium: 127 mmol/L — ABNORMAL LOW (ref 135–145)
Total Bilirubin: 0.7 mg/dL (ref 0.3–1.2)
Total Protein: 6.4 g/dL — ABNORMAL LOW (ref 6.5–8.1)

## 2021-07-03 LAB — POCT I-STAT, CHEM 8
BUN: 52 mg/dL — ABNORMAL HIGH (ref 6–20)
Calcium, Ion: 1.1 mmol/L — ABNORMAL LOW (ref 1.15–1.40)
Chloride: 103 mmol/L (ref 98–111)
Creatinine, Ser: 6.2 mg/dL — ABNORMAL HIGH (ref 0.61–1.24)
Glucose, Bld: 181 mg/dL — ABNORMAL HIGH (ref 70–99)
HCT: 20 % — ABNORMAL LOW (ref 39.0–52.0)
Hemoglobin: 6.8 g/dL — CL (ref 13.0–17.0)
Potassium: 3.7 mmol/L (ref 3.5–5.1)
Sodium: 130 mmol/L — ABNORMAL LOW (ref 135–145)
TCO2: 17 mmol/L — ABNORMAL LOW (ref 22–32)

## 2021-07-03 LAB — GLUCOSE, CAPILLARY
Glucose-Capillary: 122 mg/dL — ABNORMAL HIGH (ref 70–99)
Glucose-Capillary: 155 mg/dL — ABNORMAL HIGH (ref 70–99)
Glucose-Capillary: 154 mg/dL — ABNORMAL HIGH (ref 70–99)
Glucose-Capillary: 199 mg/dL — ABNORMAL HIGH (ref 70–99)
Glucose-Capillary: 176 mg/dL — ABNORMAL HIGH (ref 70–99)
Glucose-Capillary: 159 mg/dL — ABNORMAL HIGH (ref 70–99)
Glucose-Capillary: 168 mg/dL — ABNORMAL HIGH (ref 70–99)

## 2021-07-03 LAB — CBC
HCT: 21 % — ABNORMAL LOW (ref 39.0–52.0)
Hemoglobin: 7.1 g/dL — ABNORMAL LOW (ref 13.0–17.0)
MCH: 28.2 pg (ref 26.0–34.0)
MCHC: 33.8 g/dL (ref 30.0–36.0)
MCV: 83.3 fL (ref 80.0–100.0)
Platelets: 334 10*3/uL (ref 150–400)
RBC: 2.52 MIL/uL — ABNORMAL LOW (ref 4.22–5.81)
RDW: 12.4 % (ref 11.5–15.5)
WBC: 20.3 10*3/uL — ABNORMAL HIGH (ref 4.0–10.5)
nRBC: 0 % (ref 0.0–0.2)

## 2021-07-03 LAB — MAGNESIUM: Magnesium: 1.7 mg/dL (ref 1.7–2.4)

## 2021-07-03 LAB — PREPARE RBC (CROSSMATCH)

## 2021-07-03 LAB — URINE CULTURE: Culture: NO GROWTH

## 2021-07-03 SURGERY — IRRIGATION AND DEBRIDEMENT EXTREMITY
Anesthesia: Monitor Anesthesia Care | Laterality: Left

## 2021-07-03 MED ORDER — 0.9 % SODIUM CHLORIDE (POUR BTL) OPTIME
TOPICAL | Status: DC | PRN
  Administered 2021-07-03: 1000 mL

## 2021-07-03 MED ORDER — ROPIVACAINE HCL 5 MG/ML IJ SOLN
INTRAMUSCULAR | Status: DC | PRN
  Administered 2021-07-03: 30 mL via PERINEURAL

## 2021-07-03 MED ORDER — MIDAZOLAM HCL 2 MG/2ML IJ SOLN
INTRAMUSCULAR | Status: AC
  Administered 2021-07-03: 1 mg via INTRAVENOUS
  Filled 2021-07-03: qty 2

## 2021-07-03 MED ORDER — DILTIAZEM HCL-DEXTROSE 125-5 MG/125ML-% IV SOLN (PREMIX)
5.0000 mg/h | INTRAVENOUS | Status: DC
  Administered 2021-07-03: 5 mg/h via INTRAVENOUS
  Administered 2021-07-03: 7.5 mg/h via INTRAVENOUS
  Filled 2021-07-03: qty 125

## 2021-07-03 MED ORDER — CHLORHEXIDINE GLUCONATE 0.12 % MT SOLN: 15.0000 mL | Freq: Once | OROMUCOSAL | Status: AC

## 2021-07-03 MED ORDER — SODIUM CHLORIDE 0.9 % IV SOLN: INTRAVENOUS | Status: DC

## 2021-07-03 MED ORDER — FENTANYL CITRATE (PF) 100 MCG/2ML IJ SOLN
INTRAMUSCULAR | Status: AC
  Administered 2021-07-03: 50 ug via INTRAVENOUS
  Filled 2021-07-03: qty 2

## 2021-07-03 MED ORDER — METOPROLOL TARTRATE 5 MG/5ML IV SOLN
INTRAVENOUS | Status: AC
  Administered 2021-07-03: 2.5 mg via INTRAVENOUS
  Filled 2021-07-03: qty 5

## 2021-07-03 MED ORDER — CHLORHEXIDINE GLUCONATE CLOTH 2 % EX PADS
6.0000 | MEDICATED_PAD | Freq: Every day | CUTANEOUS | Status: DC
  Administered 2021-07-03 – 2021-07-11 (×7): 6 via TOPICAL

## 2021-07-03 MED ORDER — MIDAZOLAM HCL 2 MG/2ML IJ SOLN: 1.0000 mg | Freq: Once | INTRAMUSCULAR | Status: AC

## 2021-07-03 MED ORDER — SODIUM CHLORIDE 0.9% IV SOLUTION: Freq: Once | INTRAVENOUS | Status: DC

## 2021-07-03 MED ORDER — FENTANYL CITRATE (PF) 100 MCG/2ML IJ SOLN: 50.0000 ug | Freq: Once | INTRAMUSCULAR | Status: AC

## 2021-07-03 MED ORDER — ONDANSETRON HCL 4 MG/2ML IJ SOLN
INTRAMUSCULAR | Status: DC | PRN
  Administered 2021-07-03: 4 mg via INTRAVENOUS

## 2021-07-03 MED ORDER — PROPOFOL 500 MG/50ML IV EMUL
INTRAVENOUS | Status: DC | PRN
  Administered 2021-07-03: 100 ug/kg/min via INTRAVENOUS

## 2021-07-03 MED ORDER — ORAL CARE MOUTH RINSE: 15.0000 mL | Freq: Once | OROMUCOSAL | Status: AC

## 2021-07-03 MED ORDER — METOPROLOL TARTRATE 5 MG/5ML IV SOLN: 2.5000 mg | Freq: Once | INTRAVENOUS | Status: AC

## 2021-07-03 MED ORDER — SODIUM CHLORIDE 0.9 % IR SOLN
Status: DC | PRN
  Administered 2021-07-03: 3000 mL

## 2021-07-03 MED ORDER — METOPROLOL TARTRATE 5 MG/5ML IV SOLN
2.5000 mg | Freq: Once | INTRAVENOUS | Status: AC
  Administered 2021-07-03: 2.5 mg via INTRAVENOUS

## 2021-07-03 MED ORDER — CHLORHEXIDINE GLUCONATE 0.12 % MT SOLN
OROMUCOSAL | Status: AC
  Administered 2021-07-03: 15 mL via OROMUCOSAL
  Filled 2021-07-03: qty 15

## 2021-07-03 SURGICAL SUPPLY — 51 items
BAG COUNTER SPONGE SURGICOUNT (BAG) ×2 IMPLANT
BLADE OSCILLATING/SAGITTAL (BLADE) ×1
BLADE SW THK.38XMED NAR THN (BLADE) IMPLANT
BNDG COHESIVE 4X5 TAN STRL (GAUZE/BANDAGES/DRESSINGS) ×2 IMPLANT
BNDG COHESIVE 6X5 TAN STRL LF (GAUZE/BANDAGES/DRESSINGS) ×3 IMPLANT
BNDG ELASTIC 3X5.8 VLCR STR LF (GAUZE/BANDAGES/DRESSINGS) ×1 IMPLANT
BNDG GAUZE ELAST 4 BULKY (GAUZE/BANDAGES/DRESSINGS) ×1 IMPLANT
CANISTER WOUND CARE 500ML ATS (WOUND CARE) ×1 IMPLANT
COVER SURGICAL LIGHT HANDLE (MISCELLANEOUS) ×2 IMPLANT
CUFF TOURN SGL QUICK 24 (TOURNIQUET CUFF)
CUFF TOURN SGL QUICK 34 (TOURNIQUET CUFF) ×1
CUFF TOURN SGL QUICK 42 (TOURNIQUET CUFF) IMPLANT
CUFF TRNQT CYL 24X4X16.5-23 (TOURNIQUET CUFF) IMPLANT
CUFF TRNQT CYL 34X4.125X (TOURNIQUET CUFF) IMPLANT
DRAPE U-SHAPE 47X51 STRL (DRAPES) ×2 IMPLANT
DRSG PAD ABDOMINAL 8X10 ST (GAUZE/BANDAGES/DRESSINGS) ×2 IMPLANT
DRSG VAC ATS MED SENSATRAC (GAUZE/BANDAGES/DRESSINGS) ×1 IMPLANT
DURAPREP 26ML APPLICATOR (WOUND CARE) ×2 IMPLANT
ELECT REM PT RETURN 9FT ADLT (ELECTROSURGICAL)
ELECTRODE REM PT RTRN 9FT ADLT (ELECTROSURGICAL) IMPLANT
GAUZE SPONGE 4X4 12PLY STRL (GAUZE/BANDAGES/DRESSINGS) ×4 IMPLANT
GAUZE XEROFORM 5X9 LF (GAUZE/BANDAGES/DRESSINGS) ×2 IMPLANT
GLOVE SURG LTX SZ7 (GLOVE) ×2 IMPLANT
GLOVE SURG NEOP MICRO LF SZ7.5 (GLOVE) ×4 IMPLANT
GLOVE SURG UNDER POLY LF SZ7 (GLOVE) ×10 IMPLANT
GOWN STRL REIN XL XLG (GOWN DISPOSABLE) ×4 IMPLANT
HANDPIECE INTERPULSE COAX TIP (DISPOSABLE)
KIT BASIN OR (CUSTOM PROCEDURE TRAY) ×2 IMPLANT
KIT DRSG VAC SLVR GRANUFM (MISCELLANEOUS) IMPLANT
KIT TURNOVER KIT B (KITS) ×2 IMPLANT
MANIFOLD NEPTUNE II (INSTRUMENTS) ×1 IMPLANT
PACK ORTHO EXTREMITY (CUSTOM PROCEDURE TRAY) ×2 IMPLANT
PAD ARMBOARD 7.5X6 YLW CONV (MISCELLANEOUS) ×4 IMPLANT
PADDING CAST ABS 4INX4YD NS (CAST SUPPLIES) ×1
PADDING CAST ABS COTTON 4X4 ST (CAST SUPPLIES) ×1 IMPLANT
PADDING CAST COTTON 6X4 STRL (CAST SUPPLIES) ×2 IMPLANT
SET CYSTO W/LG BORE CLAMP LF (SET/KITS/TRAYS/PACK) ×1 IMPLANT
SET HNDPC FAN SPRY TIP SCT (DISPOSABLE) IMPLANT
SNARE SHORT THROW 13M SML OVAL (MISCELLANEOUS) IMPLANT
SPONGE T-LAP 18X18 ~~LOC~~+RFID (SPONGE) ×2 IMPLANT
STOCKINETTE IMPERVIOUS 9X36 MD (GAUZE/BANDAGES/DRESSINGS) ×2 IMPLANT
SUT ETHILON 2 0 FS 18 (SUTURE) ×2 IMPLANT
SUT ETHILON 2 0 PSLX (SUTURE) IMPLANT
SUT VIC AB 2-0 FS1 27 (SUTURE) ×4 IMPLANT
SWAB CULTURE ESWAB REG 1ML (MISCELLANEOUS) IMPLANT
TOWEL GREEN STERILE (TOWEL DISPOSABLE) ×2 IMPLANT
TOWEL GREEN STERILE FF (TOWEL DISPOSABLE) ×2 IMPLANT
TUBE CONNECTING 12X1/4 (SUCTIONS) ×2 IMPLANT
UNDERPAD 30X36 HEAVY ABSORB (UNDERPADS AND DIAPERS) ×4 IMPLANT
WATER STERILE IRR 1000ML POUR (IV SOLUTION) ×2 IMPLANT
YANKAUER SUCT BULB TIP NO VENT (SUCTIONS) ×2 IMPLANT

## 2021-07-03 NOTE — Transfer of Care (Signed)
Immediate Anesthesia Transfer of Care Note  Patient: Bruce Little  Procedure(s) Performed: IRRIGATION AND DEBRIDEMENT ,FIFTH RAY  AMPUTATION LEFT FOOT, , WOUND VAC PLACEMENT (Left)  Patient Location: PACU  Anesthesia Type:MAC combined with regional for post-op pain  Level of Consciousness: alert , patient cooperative and pateint uncooperative  Airway & Oxygen Therapy: Patient Spontanous Breathing  Post-op Assessment: Report given to RN and Post -op Vital signs reviewed and stable  Post vital signs: Reviewed and stable  Last Vitals:  Vitals Value Taken Time  BP 113/77 07/03/21 1504  Temp    Pulse 85 07/03/21 1504  Resp    SpO2 93 % 07/03/21 1504  Vitals shown include unvalidated device data.  Last Pain:  Vitals:   07/03/21 1224  TempSrc: Oral  PainSc: 0-No pain      Patients Stated Pain Goal: 1 (58/52/77 [id number])  Complications: No notable events documented.

## 2021-07-03 NOTE — Progress Notes (Signed)
PROGRESS NOTE        PATIENT DETAILS Name: Bruce Little Age: 50 y.o. Sex: male Date of Birth: 1971/01/25 Admit Date: 07/01/2021 Admitting Physician Rise Patience, MD [id number], [facility]  Brief Narrative: Patient is a 50 y.o. male DM-2, CKD stage V, HTN-who presented with worsening left foot ulceration-found to have necrotizing left foot infection with underlying osteomyelitis and AKI on CKD stage V.  Significant events: 8/23>> admit for left foot necrotizing infection-osteomyelitis-AKI  Significant studies: 8/24>> MRI left foot: Osteomyelitis fifth metatarsal head-possible osteomyelitis of the fourth metatarsal head cellulitis with air in the forefoot-concern for necrotizing infection.  Antimicrobial therapy: Vancomycin: 8/23>> Zosyn: 8/23>>  Microbiology data: 8/23>> blood cultures: No growth  Procedures : None  Consults: Orthopedics  DVT Prophylaxis : Sequential compression device to OR Start: 07/02/21 1611 SCDs Start: 07/02/21 0551   Subjective: Feels better-no chest pain or shortness of breath.  Going to the OR today for amputation.   Assessment/Plan: Sepsis due to left foot necrotizing infection with underlying acute osteomyelitis: Seems to have stabilized overnight-leukocytosis downtrending-cultures negative so far-orthopedics planning to proceed with amputation today.   AKI on CKD stage V: AKI likely hemodynamically mediated-improved with IVF and treatment of underlying sepsis physiology.  Nephrology following  Recent CVA (July 2022): Antiplatelets on hold-plan to restart once orthopedic surgery has been done.  No focal deficits evident on exam.  DM-2 (A1c 8.4 on 8/24): CBGs relatively stable-continue Lantus 10 units-SSI-follow and adjust.  Recent Labs    07/03/21 0826 07/03/21 1022 07/03/21 1224  GLUCAP 154* 199* 176*     HTN: BP stable-continue amlodipine.  Normocytic anemia: Due to a combination of CKD  stage V and acute illness.  Continue to follow CBC.    Obesity: Estimated body mass index is 34.53 kg/m as calculated from the following:   Height as of this encounter: '6\' 2"'$  (1.88 m).   Weight as of this encounter: 122 kg.    Diet: Diet Order             Diet NPO time specified  Diet effective midnight                    Code Status: Full code  Family Communication: None at bedside.  Disposition Plan: Status is: Inpatient  Remains inpatient appropriate because:Inpatient level of care appropriate due to severity of illness  Dispo: The patient is from: Home              Anticipated d/c is to: Home              Patient currently is not medically stable to d/c.   Difficult to place patient No   Barriers to Discharge: Necrotizing left foot infection with acute osteomyelitis-on IV antibiotics-amputation scheduled for 8/25.  Antimicrobial agents: Anti-infectives (From admission, onward)    Start     Dose/Rate Route Frequency Ordered Stop   07/03/21 0600  ceFAZolin (ANCEF) IVPB 3g/100 mL premix        3 g 200 mL/hr over 30 Minutes Intravenous To Short Stay 07/02/21 1610 07/04/21 0600   07/02/21 1400  [MAR Hold]  piperacillin-tazobactam (ZOSYN) IVPB 2.25 g        (MAR Hold since Thu 07/03/2021 at 1210.Hold Reason: Transfer to a Procedural area)   2.25 g 100 mL/hr over 30 Minutes Intravenous Every 8  hours 07/02/21 0558     07/02/21 0600  vancomycin (VANCOCIN) IVPB 1000 mg/200 mL premix  Status:  Discontinued        1,000 mg 200 mL/hr over 60 Minutes Intravenous  Once 07/02/21 0553 07/02/21 0700   07/02/21 0507  [MAR Hold]  vancomycin variable dose per unstable renal function (pharmacist dosing)        (MAR Hold since Thu 07/03/2021 at 1210.Hold Reason: Transfer to a Procedural area)    Does not apply See admin instructions 07/02/21 0507     07/02/21 0500  vancomycin (VANCOREADY) IVPB 2000 mg/400 mL        2,000 mg 200 mL/hr over 120 Minutes Intravenous  Once 07/02/21  0446 07/02/21 0739   07/02/21 0500  piperacillin-tazobactam (ZOSYN) IVPB 3.375 g        3.375 g 12.5 mL/hr over 240 Minutes Intravenous Once 07/02/21 0446 07/02/21 0850        Time spent: 35 minutes-Greater than 50% of this time was spent in counseling, explanation of diagnosis, planning of further management, and coordination of care.  MEDICATIONS: Scheduled Meds:  [MAR Hold] amLODipine  10 mg Oral Daily   [MAR Hold] atorvastatin  80 mg Oral Daily   [MAR Hold] Chlorhexidine Gluconate Cloth  6 each Topical Daily   [MAR Hold] fenofibrate  160 mg Oral Daily   fentaNYL       [MAR Hold] insulin aspart  0-6 Units Subcutaneous Q4H   [MAR Hold] insulin glargine-yfgn  10 Units Subcutaneous Daily   midazolam       [MAR Hold] vancomycin variable dose per unstable renal function (pharmacist dosing)   Does not apply See admin instructions   Continuous Infusions:  sodium chloride 10 mL/hr at 07/03/21 1242    ceFAZolin (ANCEF) IV     diltiazem (CARDIZEM) infusion     lactated ringers 100 mL/hr at 07/03/21 1050   [MAR Hold] piperacillin-tazobactam (ZOSYN)  IV 2.25 g (07/03/21 0519)   PRN Meds:.[MAR Hold] acetaminophen **OR** [MAR Hold] acetaminophen, [MAR Hold] fentaNYL (SUBLIMAZE) injection   PHYSICAL EXAM: Vital signs: Vitals:   07/03/21 0455 07/03/21 0700 07/03/21 1143 07/03/21 1224  BP:  (!) 155/87 137/77 (!) 168/81  Pulse: (!) 103 (!) 102 80 (!) 166  Resp:  '18 18 19  '$ Temp:  99.1 F (37.3 C) (!) 101.1 F (38.4 C) 98.1 F (36.7 C)  TempSrc:  Oral  Oral  SpO2:  95% 97% 96%  Weight:      Height:       Filed Weights   07/01/21 1936 07/02/21 0434  Weight: 111.1 kg 122 kg   Body mass index is 34.53 kg/m.   Gen Exam:Alert awake-not in any distress HEENT:atraumatic, normocephalic Chest: B/L clear to auscultation anteriorly CVS:S1S2 regular Abdomen:soft non tender, non distended Extremities:no edema-foul-smelling left foot wound-in dressing.  Did not open. Neurology: Non  focal Skin: no rash   I have personally reviewed following labs and imaging studies  LABORATORY DATA: CBC: Recent Labs  Lab 07/01/21 2055 07/02/21 0445 07/03/21 0024 07/03/21 1240  WBC 26.6* 26.7* 20.3*  --   NEUTROABS 25.5* 22.1*  --   --   HGB 8.4* 9.2* 7.1* 6.8*  HCT 25.7* 27.4* 21.0* 20.0*  MCV 84.8 84.3 83.3  --   PLT 411* 435* 334  --      Basic Metabolic Panel: Recent Labs  Lab 07/01/21 2055 07/02/21 0445 07/03/21 0024 07/03/21 1240  NA 128* 130* 127* 130*  K 4.1 4.1 3.8 3.7  CL 95* 93* 100 103  CO2 17* 21* 17*  --   GLUCOSE 254* 178* 147* 181*  BUN 60* 61* 58* 52*  CREATININE 6.58* 6.63* 6.11* 6.20*  CALCIUM 9.1 9.9 8.3*  --   MG  --   --  1.7  --      GFR: Estimated Creatinine Clearance: 20 mL/min (A) (by C-G formula based on SCr of 6.2 mg/dL (H)).  Liver Function Tests: Recent Labs  Lab 07/01/21 2055 07/02/21 0445 07/03/21 0024  AST 80* 94* 85*  ALT 72* 82* 72*  ALKPHOS 82 90 76  BILITOT 0.5 0.8 0.7  PROT 8.0 8.4* 6.4*  ALBUMIN 2.7* 2.7* 1.9*    No results for input(s): LIPASE, AMYLASE in the last 168 hours. No results for input(s): AMMONIA in the last 168 hours.  Coagulation Profile: Recent Labs  Lab 07/01/21 2055  INR 1.5*     Cardiac Enzymes: No results for input(s): CKTOTAL, CKMB, CKMBINDEX, TROPONINI in the last 168 hours.  BNP (last 3 results) No results for input(s): PROBNP in the last 8760 hours.  Lipid Profile: No results for input(s): CHOL, HDL, LDLCALC, TRIG, CHOLHDL, LDLDIRECT in the last 72 hours.  Thyroid Function Tests: No results for input(s): TSH, T4TOTAL, FREET4, T3FREE, THYROIDAB in the last 72 hours.  Anemia Panel: No results for input(s): VITAMINB12, FOLATE, FERRITIN, TIBC, IRON, RETICCTPCT in the last 72 hours.  Urine analysis:    Component Value Date/Time   COLORURINE YELLOW 07/01/2021 2036   APPEARANCEUR HAZY (A) 07/01/2021 2036   LABSPEC 1.016 07/01/2021 2036   PHURINE 5.0 07/01/2021 2036    GLUCOSEU 150 (A) 07/01/2021 2036   HGBUR MODERATE (A) 07/01/2021 2036   BILIRUBINUR NEGATIVE 07/01/2021 2036   KETONESUR NEGATIVE 07/01/2021 2036   PROTEINUR 100 (A) 07/01/2021 2036   NITRITE NEGATIVE 07/01/2021 2036   LEUKOCYTESUR NEGATIVE 07/01/2021 2036    Sepsis Labs: Lactic Acid, Venous    Component Value Date/Time   LATICACIDVEN 0.7 07/02/2021 0616    MICROBIOLOGY: Recent Results (from the past 240 hour(s))  Blood Culture (routine x 2)     Status: None (Preliminary result)   Collection Time: 07/01/21  8:45 PM   Specimen: BLOOD  Result Value Ref Range Status   Specimen Description BLOOD RIGHT ANTECUBITAL  Final   Special Requests   Final    BOTTLES DRAWN AEROBIC AND ANAEROBIC Blood Culture adequate volume   Culture   Final    NO GROWTH 2 DAYS Performed at [facility], [place] [address], [place], [place] [address]    Report Status PENDING  Incomplete  Blood Culture (routine x 2)     Status: None (Preliminary result)   Collection Time: 07/01/21  8:55 PM   Specimen: BLOOD  Result Value Ref Range Status   Specimen Description BLOOD LEFT ANTECUBITAL  Final   Special Requests   Final    BOTTLES DRAWN AEROBIC AND ANAEROBIC Blood Culture adequate volume   Culture   Final    NO GROWTH 2 DAYS Performed at [facility], [place] [address] [place], [place] [address]    Report Status PENDING  Incomplete  Resp Panel by RT-PCR (Flu A&B, Covid) Nasopharyngeal Swab     Status: None   Collection Time: 07/02/21 12:51 AM   Specimen: Nasopharyngeal Swab; Nasopharyngeal(NP) swabs in vial transport medium  Result Value Ref Range Status   SARS Coronavirus 2 by RT PCR NEGATIVE NEGATIVE Final    Comment: (NOTE) SARS-CoV-2 target nucleic acids are  NOT DETECTED.  The SARS-CoV-2 RNA is generally detectable in upper respiratory specimens during the acute phase of infection. The lowest concentration of SARS-CoV-2 viral copies this assay can detect is 138 copies/mL. A negative result  does not preclude SARS-Cov-2 infection and should not be used as the sole basis for treatment or other patient management decisions. A negative result may occur with  improper specimen collection/handling, submission of specimen other than nasopharyngeal swab, presence of viral mutation(s) within the areas targeted by this assay, and inadequate number of viral copies(<138 copies/mL). A negative result must be combined with clinical observations, patient history, and epidemiological information. The expected result is Negative.  Fact Sheet for Patients:  EntrepreneurPulse.com.au  Fact Sheet for Healthcare Providers:  IncredibleEmployment.be  This test is no t yet approved or cleared by the [place] FDA and  has been authorized for detection and/or diagnosis of SARS-CoV-2 by FDA under an Emergency Use Authorization (EUA). This EUA will remain  in effect (meaning this test can be used) for the duration of the COVID-19 declaration under Section 564(b)(1) of the Act, 21 U.S.C.section 360bbb-3(b)(1), unless the authorization is terminated  or revoked sooner.       Influenza A by PCR NEGATIVE NEGATIVE Final   Influenza B by PCR NEGATIVE NEGATIVE Final    Comment: (NOTE) The Xpert Xpress SARS-CoV-2/FLU/RSV plus assay is intended as an aid in the diagnosis of influenza from Nasopharyngeal swab specimens and should not be used as a sole basis for treatment. Nasal washings and aspirates are unacceptable for Xpert Xpress SARS-CoV-2/FLU/RSV testing.  Fact Sheet for Patients: EntrepreneurPulse.com.au  Fact Sheet for Healthcare Providers: IncredibleEmployment.be  This test is not yet approved or cleared by the [place] FDA and has been authorized for detection and/or diagnosis of SARS-CoV-2 by FDA under an Emergency Use Authorization (EUA). This EUA will remain in effect (meaning this test can be used) for  the duration of the COVID-19 declaration under Section 564(b)(1) of the Act, 21 U.S.C. section 360bbb-3(b)(1), unless the authorization is terminated or revoked.  Performed at [facility], [place] [address], [place], [place] [address]   Urine Culture     Status: None   Collection Time: 07/02/21  2:55 AM   Specimen: In/Out Cath Urine  Result Value Ref Range Status   Specimen Description IN/OUT CATH URINE  Final   Special Requests NONE  Final   Culture   Final    NO GROWTH Performed at [facility], [place] [address] [place], [place] [address]    Report Status 07/03/2021 FINAL  Final  Surgical pcr screen     Status: None   Collection Time: 07/02/21  8:00 PM   Specimen: Nasal Mucosa; Nasal Swab  Result Value Ref Range Status   MRSA, PCR NEGATIVE NEGATIVE Final   Staphylococcus aureus NEGATIVE NEGATIVE Final    Comment: (NOTE) The Xpert SA Assay (FDA approved for NASAL specimens in patients 76 years of age and older), is one component of a comprehensive surveillance program. It is not intended to diagnose infection nor to guide or monitor treatment. Performed at [facility], [place] [address], [place], [place] [address]     RADIOLOGY STUDIES/RESULTS: DG Chest 1 View  Result Date: 07/01/2021 CLINICAL DATA:  Sepsis.  Foot infection. EXAM: CHEST  1 VIEW COMPARISON:  None. FINDINGS: Low lung volumes.The cardiomediastinal contours are normal. The lungs are clear. Pulmonary vasculature is normal. No consolidation, pleural effusion, or pneumothorax. No acute osseous abnormalities are  seen. IMPRESSION: Low lung volumes without acute chest finding. Electronically Signed   By: Keith Rake M.D.   On: 07/01/2021 21:34   MR FOOT LEFT WO CONTRAST  Result Date: 07/02/2021 CLINICAL DATA:  Left foot pain for few days. Wound along the plantar aspect of the left foot. EXAM: MRI OF THE LEFT FOOT WITHOUT CONTRAST TECHNIQUE: Multiplanar, multisequence MR imaging of the left foot was  performed. No intravenous contrast was administered. COMPARISON:  None. FINDINGS: Bones/Joint/Cartilage Wound along the plantar aspect of the fifth metatarsal head. Severe bone marrow edema in the fifth metatarsal head. Small fifth MTP joint effusion. Bone marrow edema in the plantar aspect of the fourth metatarsal head which may be reactive versus secondary to early osteomyelitis. Normal alignment. No joint effusion. No acute fracture or dislocation. Ligaments Collateral ligaments are intact.  Lisfranc ligament is intact. Muscles and Tendons Flexor, peroneal and extensor compartment tendons are intact. T2 hyperintensity throughout the plantar musculature likely neurogenic. Soft tissue No fluid collection or hematoma. No soft tissue mass. Severe soft tissue edema involving the lateral aspect of the fifth metatarsal around the soft tissue wound with soft tissue emphysema as can be seen with a necrotizing infection. 7 mm fluid collection in the subcutaneous fat along the dorsal lateral aspect of the fifth metatarsal shaft concerning for a tiny abscess. IMPRESSION: 1. Wound along the plantar aspect of the fifth metatarsal head. Severe bone marrow edema in the fifth metatarsal head concerning for osteomyelitis. Small fifth MTP joint effusion which may be reactive versus secondary to septic arthritis. 2. Cellulitis along the lateral aspect of the forefoot with air seen within the soft tissues around the fifth MTP joint concerning for necrotizing infection. 3. Bone marrow edema in the plantar aspect of the fourth metatarsal head which may be reactive versus secondary to early osteomyelitis. Electronically Signed   By: Kathreen Devoid M.D.   On: 07/02/2021 10:00   DG Foot Complete Left  Result Date: 07/01/2021 CLINICAL DATA:  Questionable sepsis. EXAM: LEFT FOOT - COMPLETE 3+ VIEW COMPARISON:  None. FINDINGS: There is no acute fracture or dislocation. The bones are mildly osteopenic. No bone erosion or periosteal  elevation to suggest osteomyelitis. There is soft tissue swelling of the forefoot. There is soft tissue gas centered at the fifth MTP joint consistent with gas-forming infectious/necrotizing fasciitis. MRI may provide better evaluation if there is clinical concern for acute osteomyelitis. IMPRESSION: 1. No acute fracture or dislocation. 2. Soft tissue gas centered at the fifth MTP joint. No radiographic evidence of osteomyelitis. Electronically Signed   By: Anner Crete M.D.   On: 07/01/2021 21:36     LOS: 1 day   Oren Binet, MD  [facility]    To contact the attending provider between 7A-7P or the covering provider during after hours 7P-7A, please log into the web site [email or web address] and access using universal [place] password for that web site. If you do not have the password, please call the hospital operator.  07/03/2021, 12:52 PM

## 2021-07-03 NOTE — Op Note (Addendum)
   Date of Surgery: 07/03/2021  INDICATIONS: Mr. Blew is a 50 y.o.-year-old male with a left diabetic foot infection.  The Patient did consent to the procedure after discussion of the risks and benefits.  PREOPERATIVE DIAGNOSIS: Left diabetic foot infection, small toe gangrene, osteomyelitis  POSTOPERATIVE DIAGNOSIS: Same.  PROCEDURE:  Excisional debridement of diabetic left foot infection including skin, subcutaneous tissue, muscle, tendon, bone 10 x 10 cm Left foot fifth ray amputation Application of wound VAC greater than 50 cm  Debridement type: Excisional Debridement  Side: left  Body Location: Foot  Tools used for debridement: scalpel, curette, and rongeur  Pre-debridement Wound size (cm):   Length: 0        Width: 0     Depth: 0   Post-debridement Wound size (cm):   Length: 10        Width: 10     Depth: 3   Debridement depth beyond dead/damaged tissue down to healthy viable tissue: yes  Tissue layer involved: skin, subcutaneous tissue, muscle / fascia, bone  Nature of tissue removed: Necrotic, Devitalized Tissue, and Purulence  Irrigation volume: 3 L     Irrigation fluid type: Normal Saline   SURGEON: N. Eduard Roux, M.D.  ASSIST: Ciro Backer [place], [place]; necessary for the timely completion of procedure and due to complexity of procedure.  ANESTHESIA: Popliteal block, MAC  IV FLUIDS AND URINE: See anesthesia.  ESTIMATED BLOOD LOSS: 50 mL.  IMPLANTS: None  DRAINS: Wound VAC  COMPLICATIONS: see description of procedure.  DESCRIPTION OF PROCEDURE: The patient was brought to the operating room.  The patient had been signed prior to the procedure and this was documented. The patient had the anesthesia placed by the anesthesiologist.  A time-out was performed to confirm that this was the correct patient, site, side and location. The patient did receive antibiotics prior to the incision and was re-dosed during the procedure as needed at indicated intervals.   A tourniquet was placed on the upper calf.   The patient had the operative extremity prepped and draped in the standard surgical fashion.    We first began with sharp excisional debridement with a scalpel of all the devitalized skin and the underlying subcutaneous tissue muscle.  There was a foul odor coming from this area.  The debridement was carried down all the way to the fifth metatarsal.  I carried out my debridement until I found viable tissue and punctate bleeding skin edges.  After I was pleased with the debridement given the massive amount of soft tissue loss it was evident that I needed to perform 1/5 ray amputation as well.  Therefore I used an oscillating saw to osteotomize the fifth metatarsal about halfway down in a beveled fashion.  After this I then thoroughly irrigated the entire wound with 3 L normal saline.  The tourniquet was then deflated and hemostasis was obtained.  Given the orientation of the wound and the soft tissue loss wound VAC was applied.  Patient tolerated procedure well and no immediate complications.  Tawanna Cooler was necessary for opening, closing, retracting, limb positioning and overall facilitation and timely completion of the procedure.  POSTOPERATIVE PLAN: Patient will need to return back to the operating room on Monday for repeat I&D.  Azucena Cecil, MD 9:28 PM

## 2021-07-03 NOTE — Anesthesia Postprocedure Evaluation (Signed)
Anesthesia Post Note  Patient: Bruce Little  Procedure(s) Performed: IRRIGATION AND DEBRIDEMENT ,FIFTH RAY  AMPUTATION LEFT FOOT, , WOUND VAC PLACEMENT (Left)     Patient location during evaluation: PACU Anesthesia Type: Regional Level of consciousness: awake and alert Pain management: pain level controlled Vital Signs Assessment: post-procedure vital signs reviewed and stable Respiratory status: spontaneous breathing, nonlabored ventilation, respiratory function stable and patient connected to nasal cannula oxygen Cardiovascular status: stable and blood pressure returned to baseline Postop Assessment: no apparent nausea or vomiting Anesthetic complications: no   No notable events documented.  Last Vitals:  Vitals:   07/03/21 1619 07/03/21 1649  BP: 134/73 118/85  Pulse: 100 66  Resp: 19 15  Temp:    SpO2: 96% 95%    Last Pain:  Vitals:   07/03/21 1619  TempSrc:   PainSc: Tyler Deis

## 2021-07-03 NOTE — Anesthesia Procedure Notes (Signed)
Anesthesia Regional Block: Popliteal block   Pre-Anesthetic Checklist: , timeout performed,  Correct Patient, Correct Site, Correct Laterality,  Correct Procedure, Correct Position, site marked,  Risks and benefits discussed,  Surgical consent,  Pre-op evaluation,  At surgeon's request and post-op pain management  Laterality: Left  Prep: chloraprep       Needles:  Injection technique: Single-shot  Needle Type: Echogenic Needle     Needle Length: 9cm  Needle Gauge: 21     Additional Needles:   Procedures:,,,, ultrasound used (permanent image in chart),,    Narrative:  Start time: 07/03/2021 1:07 PM End time: 07/03/2021 1:14 PM Injection made incrementally with aspirations every 5 mL.  Performed by: Personally  Anesthesiologist: Suzette Battiest, MD

## 2021-07-03 NOTE — Plan of Care (Signed)
  Problem: Education: Goal: Knowledge of General Education information will improve Description: Including pain rating scale, medication(s)/side effects and non-pharmacologic comfort measures Outcome: Progressing   Problem: Coping: Goal: Level of anxiety will decrease Outcome: Progressing   

## 2021-07-03 NOTE — Anesthesia Procedure Notes (Signed)
Procedure Name: MAC Date/Time: 07/03/2021 2:10 PM Performed by: Griffin Dakin, CRNA Pre-anesthesia Checklist: Patient identified, Emergency Drugs available, Suction available, Patient being monitored and Timeout performed Patient Re-evaluated:Patient Re-evaluated prior to induction Oxygen Delivery Method: Simple face mask Induction Type: IV induction Placement Confirmation: positive ETCO2 and breath sounds checked- equal and bilateral Dental Injury: Teeth and Oropharynx as per pre-operative assessment

## 2021-07-03 NOTE — Progress Notes (Signed)
I-stat results reviewed by anesthesia: Dr. Rodman Comp at [phone]

## 2021-07-03 NOTE — Progress Notes (Signed)
Admit: 07/01/2021 LOS: 1  27M CKD5 2/2 DKD admitted with osteomyelitis and necrotizing infection of left foot; has worsened creatinine/GFR compared to recent baseline.    Subjective:  For surgery today Creatinine 6.1, serum sodium down to 127 Blood pressures stable, on room air 1 L UOP yesterday No complaints at the current time  08/24 0701 - 08/25 0700 In: 3588 [P.O.:680; I.V.:2708; IV Piggyback:200] Out: 1050 [Urine:1050]  Filed Weights   07/01/21 1936 07/02/21 0434  Weight: 111.1 kg 122 kg    Scheduled Meds:  amLODipine  10 mg Oral Daily   atorvastatin  80 mg Oral Daily   Chlorhexidine Gluconate Cloth  6 each Topical Daily   fenofibrate  160 mg Oral Daily   insulin aspart  0-6 Units Subcutaneous Q4H   insulin glargine-yfgn  10 Units Subcutaneous Daily   vancomycin variable dose per unstable renal function (pharmacist dosing)   Does not apply See admin instructions   Continuous Infusions:   ceFAZolin (ANCEF) IV     lactated ringers 100 mL/hr at 07/03/21 1050   piperacillin-tazobactam (ZOSYN)  IV 2.25 g (07/03/21 0519)   PRN Meds:.acetaminophen **OR** acetaminophen, fentaNYL (SUBLIMAZE) injection  Current Labs: reviewed    Physical Exam:  Blood pressure 137/77, pulse 80, temperature (!) 101.1 F (38.4 C), resp. rate 18, height '6\' 2"'$  (1.88 m), weight 122 kg, SpO2 97 %. GEN: NAD ENT: NCAT EYES: EOMI CV: Regular, normal S1 and S2 PULM: Clear bilaterally, normal work of breathing ABD: Soft, nontender SKIN: No rashes or lesions EXT: No edema, deferred evaluation of left foot  A CKD 5, recent worsening in creatinine, unclear if acute or progressive.  Biopsy-proven DKD.  No immediate indication to initiate dialysis but patient is aware of its likelihood in the future. Left fifth metatarsal head osteomyelitis and likely necrotizing infection/cellulitis, on vancomycin and Zosyn, orthopedics following and likely to receive ray amputation or TMA today 8/25 Hypertension: On  amlodipine, monitor Anemia: Mild, normocytic, likely related to #1 and #2, trend for now Mild hyponatremia, limit free water intake, trend Leukocytosis secondary to #1  P I do not think we need any more IV fluids Would restrict total fluid intake to 1.5 L/day moving forward No uremic symptoms, does not need HD.  See how GFR changes in the next 24 to 48 hours Medication Issues; Preferred narcotic agents for pain control are hydromorphone, fentanyl, and methadone. Morphine should not be used.  Baclofen should be avoided Avoid oral sodium phosphate and magnesium citrate based laxatives / bowel preps    Pearson Grippe MD 07/03/2021, 12:00 PM  Recent Labs  Lab 07/01/21 2055 07/02/21 0445 07/03/21 0024  NA 128* 130* 127*  K 4.1 4.1 3.8  CL 95* 93* 100  CO2 17* 21* 17*  GLUCOSE 254* 178* 147*  BUN 60* 61* 58*  CREATININE 6.58* 6.63* 6.11*  CALCIUM 9.1 9.9 8.3*   Recent Labs  Lab 07/01/21 2055 07/02/21 0445 07/03/21 0024  WBC 26.6* 26.7* 20.3*  NEUTROABS 25.5* 22.1*  --   HGB 8.4* 9.2* 7.1*  HCT 25.7* 27.4* 21.0*  MCV 84.8 84.3 83.3  PLT 411* 435* 334

## 2021-07-03 NOTE — H&P (Signed)

## 2021-07-03 NOTE — Anesthesia Preprocedure Evaluation (Signed)
Anesthesia Evaluation  Patient identified by MRN, date of birth, ID band Patient awake    Reviewed: Allergy & Precautions, NPO status , Patient's Chart, lab work & pertinent test results  Airway Mallampati: III  TM Distance: >3 FB     Dental  (+) Dental Advisory Given   Pulmonary neg pulmonary ROS,    breath sounds clear to auscultation       Cardiovascular hypertension, Pt. on medications  Rhythm:Irregular Rate:Tachycardia  afib with rvr   Neuro/Psych CVA    GI/Hepatic negative GI ROS, Neg liver ROS,   Endo/Other  diabetes, Type 2, Oral Hypoglycemic Agents  Renal/GU CRF and ARFRenal disease     Musculoskeletal   Abdominal   Peds  Hematology  (+) anemia ,   Anesthesia Other Findings   Reproductive/Obstetrics                             Anesthesia Physical Anesthesia Plan  ASA: 4  Anesthesia Plan: Regional and MAC   Post-op Pain Management:    Induction:   PONV Risk Score and Plan: 1 and Propofol infusion, Ondansetron and Treatment may vary due to age or medical condition  Airway Management Planned: Natural Airway and Simple Face Mask  Additional Equipment:   Intra-op Plan:   Post-operative Plan:   Informed Consent: I have reviewed the patients History and Physical, chart, labs and discussed the procedure including the risks, benefits and alternatives for the proposed anesthesia with the patient or authorized representative who has indicated his/her understanding and acceptance.       Plan Discussed with: CRNA  Anesthesia Plan Comments:         Anesthesia Quick Evaluation

## 2021-07-04 ENCOUNTER — Inpatient Hospital Stay (HOSPITAL_COMMUNITY): Payer: BC Managed Care – PPO

## 2021-07-04 ENCOUNTER — Encounter (HOSPITAL_COMMUNITY): Payer: Self-pay | Admitting: Orthopaedic Surgery

## 2021-07-04 DIAGNOSIS — I480 Paroxysmal atrial fibrillation: Secondary | ICD-10-CM | POA: Diagnosis not present

## 2021-07-04 LAB — CBC
HCT: 28.8 % — ABNORMAL LOW (ref 39.0–52.0)
Hemoglobin: 9.9 g/dL — ABNORMAL LOW (ref 13.0–17.0)
MCH: 28.7 pg (ref 26.0–34.0)
MCHC: 34.4 g/dL (ref 30.0–36.0)
MCV: 83.5 fL (ref 80.0–100.0)
Platelets: 381 10*3/uL (ref 150–400)
RBC: 3.45 MIL/uL — ABNORMAL LOW (ref 4.22–5.81)
RDW: 13.1 % (ref 11.5–15.5)
WBC: 23 10*3/uL — ABNORMAL HIGH (ref 4.0–10.5)
nRBC: 0 % (ref 0.0–0.2)

## 2021-07-04 LAB — VANCOMYCIN, RANDOM: Vancomycin Rm: 12

## 2021-07-04 LAB — TYPE AND SCREEN
ABO/RH(D): B POS
Antibody Screen: NEGATIVE
Unit division: 0
Unit division: 0

## 2021-07-04 LAB — ECHOCARDIOGRAM COMPLETE
AR max vel: 3.42 cm2
AV Area VTI: 3.44 cm2
AV Area mean vel: 3.39 cm2
AV Mean grad: 5 mmHg
AV Peak grad: 9.7 mmHg
Ao pk vel: 1.56 m/s
Area-P 1/2: 3.55 cm2
Height: 74 in
MV M vel: 4.78 m/s
MV Peak grad: 91.4 mmHg
S' Lateral: 3.5 cm
Single Plane A4C EF: 55.2 %
Weight: 3749.58 oz

## 2021-07-04 LAB — GLUCOSE, CAPILLARY
Glucose-Capillary: 147 mg/dL — ABNORMAL HIGH (ref 70–99)
Glucose-Capillary: 144 mg/dL — ABNORMAL HIGH (ref 70–99)
Glucose-Capillary: 181 mg/dL — ABNORMAL HIGH (ref 70–99)
Glucose-Capillary: 258 mg/dL — ABNORMAL HIGH (ref 70–99)
Glucose-Capillary: 192 mg/dL — ABNORMAL HIGH (ref 70–99)

## 2021-07-04 LAB — RENAL FUNCTION PANEL
Albumin: 2 g/dL — ABNORMAL LOW (ref 3.5–5.0)
Anion gap: 13 (ref 5–15)
BUN: 54 mg/dL — ABNORMAL HIGH (ref 6–20)
CO2: 15 mmol/L — ABNORMAL LOW (ref 22–32)
Calcium: 8.9 mg/dL (ref 8.9–10.3)
Chloride: 102 mmol/L (ref 98–111)
Creatinine, Ser: 5.7 mg/dL — ABNORMAL HIGH (ref 0.61–1.24)
GFR, Estimated: 11 mL/min — ABNORMAL LOW (ref >60)
Glucose, Bld: 147 mg/dL — ABNORMAL HIGH (ref 70–99)
Phosphorus: 5.2 mg/dL — ABNORMAL HIGH (ref 2.5–4.6)
Potassium: 3.9 mmol/L (ref 3.5–5.1)
Sodium: 130 mmol/L — ABNORMAL LOW (ref 135–145)

## 2021-07-04 LAB — BPAM RBC
Blood Product Expiration Date: 202209212359
Blood Product Expiration Date: 202209212359
ISSUE DATE / TIME: 202208251351
ISSUE DATE / TIME: 202208251351
Unit Type and Rh: 7300
Unit Type and Rh: 7300

## 2021-07-04 MED ORDER — VANCOMYCIN HCL 1250 MG/250ML IV SOLN
1250.0000 mg | Freq: Once | INTRAVENOUS | Status: AC
  Administered 2021-07-04: 1250 mg via INTRAVENOUS
  Filled 2021-07-04: qty 250

## 2021-07-04 MED ORDER — SODIUM BICARBONATE 650 MG PO TABS
650.0000 mg | ORAL_TABLET | Freq: Two times a day (BID) | ORAL | Status: DC
Start: 2021-07-04 — End: 2021-07-11
  Administered 2021-07-04 – 2021-07-11 (×15): 650 mg via ORAL
  Filled 2021-07-04 (×15): qty 1

## 2021-07-04 MED ORDER — OXYCODONE HCL 5 MG PO TABS
5.0000 mg | ORAL_TABLET | Freq: Four times a day (QID) | ORAL | Status: DC | PRN
  Administered 2021-07-04 – 2021-07-11 (×26): 5 mg via ORAL
  Filled 2021-07-04 (×26): qty 1

## 2021-07-04 MED ORDER — HEPARIN (PORCINE) 25000 UT/250ML-% IV SOLN
2500.0000 [IU]/h | INTRAVENOUS | Status: DC
  Administered 2021-07-04: 1450 [IU]/h via INTRAVENOUS
  Administered 2021-07-06: 2500 [IU]/h via INTRAVENOUS
  Filled 2021-07-04 (×5): qty 250

## 2021-07-04 MED ORDER — METOPROLOL TARTRATE 50 MG PO TABS
50.0000 mg | ORAL_TABLET | Freq: Two times a day (BID) | ORAL | Status: DC
  Administered 2021-07-04 – 2021-07-07 (×7): 50 mg via ORAL
  Filled 2021-07-04 (×7): qty 1

## 2021-07-04 MED ORDER — INSULIN ASPART 100 UNIT/ML IJ SOLN
0.0000 [IU] | Freq: Three times a day (TID) | INTRAMUSCULAR | Status: DC
  Administered 2021-07-05 (×2): 2 [IU] via SUBCUTANEOUS
  Administered 2021-07-05 – 2021-07-10 (×7): 1 [IU] via SUBCUTANEOUS

## 2021-07-04 MED ORDER — CLOPIDOGREL BISULFATE 75 MG PO TABS: 75.0000 mg | ORAL_TABLET | Freq: Every day | ORAL | Status: DC

## 2021-07-04 MED ORDER — INSULIN ASPART 100 UNIT/ML IJ SOLN: 0.0000 [IU] | INTRAMUSCULAR | Status: DC

## 2021-07-04 NOTE — Progress Notes (Signed)
Admit: 07/01/2021 LOS: 2  67M CKD5 2/2 DKD admitted with osteomyelitis and necrotizing infection of left foot; has worsened creatinine/GFR compared to recent baseline.    Subjective:  Status post debridement of left foot infection and left fifth ray amputation with wound VAC placement yesterday Possibly for subsequent surgery early next week for repeat I&D Creatinine improved to 5.7, 1.2 L urine output Had atrial fibrillation with RVR and required Cardizem drip  08/25 0701 - 08/26 0700 In: 1709.2 [P.O.:630; I.V.:349.2; Blood:630; IV Piggyback:100] Out: [id number] [Urine:1200; Drains:25; Blood:20]  Filed Weights   07/01/21 1936 07/02/21 0434 07/03/21 1801  Weight: 111.1 kg 122 kg 106.3 kg    Scheduled Meds:  atorvastatin  80 mg Oral Daily   Chlorhexidine Gluconate Cloth  6 each Topical Daily   fenofibrate  160 mg Oral Daily   insulin aspart  0-6 Units Subcutaneous Q4H   insulin glargine-yfgn  10 Units Subcutaneous Daily   metoprolol tartrate  50 mg Oral BID   sodium bicarbonate  650 mg Oral BID   vancomycin variable dose per unstable renal function (pharmacist dosing)   Does not apply See admin instructions   Continuous Infusions:  piperacillin-tazobactam (ZOSYN)  IV 2.25 g (07/04/21 0659)   PRN Meds:.acetaminophen **OR** acetaminophen, fentaNYL (SUBLIMAZE) injection, oxyCODONE  Current Labs: reviewed    Physical Exam:  Blood pressure (!) 163/78, pulse 98, temperature 97.6 F (36.4 C), temperature source Oral, resp. rate 18, height '6\' 2"'$  (1.88 m), weight 106.3 kg, SpO2 96 %. GEN: NAD ENT: NCAT EYES: EOMI CV: Regular, normal S1 and S2 PULM: Clear bilaterally, normal work of breathing ABD: Soft, nontender SKIN: No rashes or lesions EXT: No edema, deferred evaluation of left foot  A CKD 5, recent worsening in creatinine, unclear if acute or progressive.  Biopsy-proven DKD.  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 Morphine should not be used.  Baclofen should be avoided Avoid oral sodium phosphate and magnesium citrate based laxatives / bowel preps    Pearson Grippe MD 07/04/2021, 11:23 AM  Recent Labs  Lab 07/02/21 0445 07/03/21 0024 07/03/21 1240 07/04/21 0601  NA 130* 127* 130* 130*  K 4.1 3.8 3.7 3.9  CL 93* 100 103 102  CO2 21* 17*  --  15*  GLUCOSE 178* 147* 181* 147*  BUN 61* 58* 52* 54*  CREATININE 6.63* 6.11* 6.20* 5.70*  CALCIUM 9.9 8.3*  --  8.9  PHOS  --   --   --  5.2*    Recent Labs  Lab 07/01/21 2055 07/02/21 0445 07/03/21 0024 07/03/21 1240 07/04/21 0601  WBC 26.6* 26.7* 20.3*  --  23.0*  NEUTROABS 25.5* 22.1*  --   --   --   HGB 8.4* 9.2* 7.1* 6.8* 9.9*  HCT 25.7* 27.4* 21.0* 20.0* 28.8*  MCV 84.8 84.3 83.3  --  83.5  PLT 411* 435* 334  --  381

## 2021-07-04 NOTE — Consult Note (Signed)
CARDIOLOGY CONSULT NOTE  Patient ID: Bruce Little MRN: [id number] DOB/AGE: December 12, 1970 50 y.o.  Admit date: 07/01/2021 Referring Physician: [facility] hospitalist Reason for Consultation:  Atrial fibrillation  HPI:   50 y.o. Caucasian male  with hypertension, type 2 DM, h/o stroke, CKD V, admitted with foot ulceration-found to have necrotizing left foot infection with underlying osteomyelitis and AKI on CKD stage V, now with paroxysmal Afib  Patient is a middle Education officer, museum, has mildly uncontrolled diabetes at baseline.  He had an inverted injury to his fifth toe which turned into necrotizing fasciitis and osteomyelitis.  Patient underwent fifth toe amputation by orthopedics, currently has a wound VAC in place.  There is plan for debridement at some point during this hospitalization.  On 07/03/2021, patient had an episode of A. fib associated with no symptoms, with ventricular rate in 150s.  Patient has self converted and has stayed in sinus rhythm since then.  At baseline, patient denies any chest pain, shortness of breath, palpitation symptoms.  Wife endorses that patient snores at night.  Patient was recently hospitalized in July 2022 with left-sided numbness and weakness.  MRI brain showed for small acute/early subacute infarct of the right caudate body, no large vessel occlusion.  He was discharged on aspirin 325 mg daily, and Plavix 75 mg daily.  There was no prior evidence of A. fib up until now.  Past Medical History:  Diagnosis Date   DM2 (diabetes mellitus, type 2) ([place])    HTN (hypertension)      Past Surgical History:  Procedure Laterality Date   I & D EXTREMITY Left 07/03/2021   Procedure: IRRIGATION AND DEBRIDEMENT ,FIFTH RAY  AMPUTATION LEFT FOOT, , WOUND VAC PLACEMENT;  Surgeon: Leandrew Koyanagi, MD;  Location: [place];  Service: Orthopedics;  Laterality: Left;      Family History  Problem Relation Age of Onset   Stroke Mother      Social History: Social History    Socioeconomic History   Marital status: Unknown    Spouse name: Not on file   Number of children: Not on file   Years of education: Not on file   Highest education level: Not on file  Occupational History   Not on file  Tobacco Use   Smoking status: Never   Smokeless tobacco: Never  Substance and Sexual Activity   Alcohol use: Yes    Comment: occ   Drug use: Never   Sexual activity: Not on file  Other Topics Concern   Not on file  Social History Narrative   Not on file   Social Determinants of Health   Financial Resource Strain: Not on file  Food Insecurity: Not on file  Transportation Needs: Not on file  Physical Activity: Not on file  Stress: Not on file  Social Connections: Not on file  Intimate Partner Violence: Not on file     Medications Prior to Admission  Medication Sig Dispense Refill Last Dose   acetaminophen (TYLENOL) 325 MG tablet Take 2 tablets (650 mg total) by mouth every 4 (four) hours as needed for mild pain (or temp > 37.5 C (99.5 F)). 30 tablet 0 07/01/2021   amLODipine (NORVASC) 10 MG tablet Take 1 tablet (10 mg total) by mouth daily. 30 tablet 0 07/01/2021   atorvastatin (LIPITOR) 80 MG tablet Take 1 tablet (80 mg total) by mouth daily. (Patient taking differently: Take 80 mg by mouth every evening.) 30 tablet 0 07/01/2021   clopidogrel (PLAVIX) 75 MG tablet  Take 1 tablet (75 mg total) by mouth daily. 30 tablet 0 07/01/2021 at 0700   fenofibrate 160 MG tablet Take 1 tablet (160 mg total) by mouth daily. (Patient taking differently: Take 160 mg by mouth every evening.) 30 tablet 0 07/01/2021   glipiZIDE (GLUCOTROL) 5 MG tablet Take 1 tablet (5 mg total) by mouth daily. 30 tablet 0 07/01/2021   hydrOXYzine (ATARAX/VISTARIL) 25 MG tablet Take 25 mg by mouth every evening.   07/01/2021   loratadine (CLARITIN) 10 MG tablet Take 10 mg by mouth daily as needed for allergies.   07/01/2021   multivitamin (ONE-A-DAY MEN'S) TABS tablet Take 1 tablet by mouth daily.    07/01/2021   pantoprazole (PROTONIX) 40 MG tablet Take 1 tablet (40 mg total) by mouth daily. 30 tablet 0 07/01/2021   VICTOZA 18 MG/3ML SOPN Inject 1.8 mg into the skin daily.   07/01/2021   zolpidem (AMBIEN) 10 MG tablet Take 5-10 mg by mouth at bedtime as needed for sleep.   Past Month    Review of Systems  Constitutional: Negative for decreased appetite, malaise/fatigue, weight gain and weight loss.  HENT:  Negative for congestion.   Eyes:  Negative for visual disturbance.  Cardiovascular:  Negative for chest pain, dyspnea on exertion, leg swelling, palpitations and syncope.  Respiratory:  Negative for cough.   Endocrine: Negative for cold intolerance.  Hematologic/Lymphatic: Does not bruise/bleed easily.  Skin:  Negative for itching and rash.  Musculoskeletal:  Negative for myalgias.       No significant pain at amputation site  Gastrointestinal:  Negative for abdominal pain, nausea and vomiting.  Genitourinary:  Negative for dysuria.  Neurological:  Negative for dizziness and weakness.  Psychiatric/Behavioral:  The patient is not nervous/anxious.   All other systems reviewed and are negative.    Physical Exam: Physical Exam Vitals and nursing note reviewed.  Constitutional:      General: He is not in acute distress.    Appearance: He is well-developed.  HENT:     Head: Normocephalic and atraumatic.  Eyes:     Conjunctiva/sclera: Conjunctivae normal.     Pupils: Pupils are equal, round, and reactive to light.  Neck:     Vascular: No JVD.  Cardiovascular:     Rate and Rhythm: Normal rate and regular rhythm.     Pulses: Intact distal pulses.          Dorsalis pedis pulses are 1+ on the right side and 1+ on the left side.       Posterior tibial pulses are 0 on the right side and 0 on the left side.     Heart sounds: No murmur heard. Pulmonary:     Effort: Pulmonary effort is normal.     Breath sounds: Normal breath sounds. No wheezing or rales.  Abdominal:     General:  Bowel sounds are normal.     Palpations: Abdomen is soft.     Tenderness: There is no rebound.  Musculoskeletal:        General: No tenderness. Normal range of motion.     Right lower leg: No edema.     Left lower leg: No edema.     Comments: S/p left fifth toe amputation, wound VAC in place  Lymphadenopathy:     Cervical: No cervical adenopathy.  Skin:    General: Skin is warm and dry.  Neurological:     Mental Status: He is alert and oriented to person, place, and time.  Cranial Nerves: No cranial nerve deficit.     Labs:   Lab Results  Component Value Date   WBC 23.0 (H) 07/04/2021   HGB 9.9 (L) 07/04/2021   HCT 28.8 (L) 07/04/2021   MCV 83.5 07/04/2021   PLT 381 07/04/2021    Recent Labs  Lab 07/03/21 0024 07/03/21 1240 07/04/21 0601  NA 127*   < > 130*  K 3.8   < > 3.9  CL 100   < > 102  CO2 17*  --  15*  BUN 58*   < > 54*  CREATININE 6.11*   < > 5.70*  CALCIUM 8.3*  --  8.9  PROT 6.4*  --   --   BILITOT 0.7  --   --   ALKPHOS 76  --   --   ALT 72*  --   --   AST 85*  --   --   GLUCOSE 147*   < > 147*   < > = values in this interval not displayed.    Lipid Panel     Component Value Date/Time   CHOL 271 (H) 05/21/2021 0257   TRIG 456 (H) 05/21/2021 0257   HDL 31 (L) 05/21/2021 0257   CHOLHDL 8.7 05/21/2021 0257   VLDL UNABLE TO CALCULATE IF TRIGLYCERIDE OVER 400 mg/dL 05/21/2021 0257   LDLCALC UNABLE TO CALCULATE IF TRIGLYCERIDE OVER 400 mg/dL 05/21/2021 0257    BNP (last 3 results) No results for input(s): BNP in the last 8760 hours.  HEMOGLOBIN A1C Lab Results  Component Value Date   HGBA1C 8.4 (H) 07/02/2021   MPG 194.38 07/02/2021    Cardiac Panel (last 3 results) Recent Labs    05/20/21 1417  CKTOTAL 253    Lab Results  Component Value Date   CKTOTAL 253 05/20/2021     TSH No results for input(s): TSH in the last 8760 hours.    Radiology: No results found.  Scheduled Meds:  atorvastatin  80 mg Oral Daily    Chlorhexidine Gluconate Cloth  6 each Topical Daily   fenofibrate  160 mg Oral Daily   insulin aspart  0-6 Units Subcutaneous Q4H   insulin glargine-yfgn  10 Units Subcutaneous Daily   metoprolol tartrate  50 mg Oral BID   sodium bicarbonate  650 mg Oral BID   vancomycin variable dose per unstable renal function (pharmacist dosing)   Does not apply See admin instructions   Continuous Infusions:  piperacillin-tazobactam (ZOSYN)  IV 2.25 g (07/04/21 0659)   vancomycin 1,250 mg (07/04/21 0931)   PRN Meds:.acetaminophen **OR** acetaminophen, fentaNYL (SUBLIMAZE) injection, oxyCODONE  CARDIAC STUDIES:  EKG 07/03/2021: Afib w/RVR 152 bpm Nonspecific ST-T abnormality  Echocardiogram 05/20/2021:  1. Left ventricular ejection fraction, by estimation, is 60 to 65%. The  left ventricle has normal function. The left ventricle has no regional  wall motion abnormalities. There is mild concentric left ventricular  hypertrophy. Left ventricular diastolic  function could not be evaluated.   2. Right ventricular systolic function is normal. The right ventricular  size is normal. Tricuspid regurgitation signal is inadequate for assessing  PA pressure.   3. Left atrial size was mildly dilated.   4. The mitral valve is normal in structure. Trivial mitral valve  regurgitation. No evidence of mitral stenosis.   5. The aortic valve is normal in structure. Aortic valve regurgitation is  not visualized. No aortic stenosis is present.   6. The inferior vena cava is normal in size with <50%  respiratory  variability, suggesting right atrial pressure of 8 mmHg.     Assessment & Recommendations:  50 y.o. Caucasian male  with hypertension, type 2 DM, h/o stroke, CKD V, admitted with foot ulceration-found to have necrotizing left foot infection with underlying osteomyelitis and AKI on CKD stage V, now with paroxysmal Afib  Paroxysmal A. fib: Currently in sinus rhythm. Agree with metoprolol tartrate 50 mg  twice daily. CHA2DS2-VASc score 4, annual stroke risk 4.6%.  Recommend anticoagulation. Given upcoming surgical debridement, recommend heparin for anticoagulation while hospitalized.   Recommend Eliquis 5 mg twice daily on discharge. With initiation of Eliquis, recommend stopping Plavix and aspirin.  In absence of large vessel occlusion, A. fib very likely etiology of his recent stroke. Recommend outpatient sleep study, will consider ischemia testing although suspicion is low.  Abnormal vascular exam: DP 2+ bilaterally, absent PT.  With his diabetes and recent amputation, will obtain outpatient lower extremity duplex to evaluate for PAD.  Mixed hyperlipidemia: High LDL as well as triglycerides.  Recommend renally adjusted statin.  Currently on Lipitor 80 mg daily.  Dose may need adjustment based on renal function.   Cardiology will sign off.  We will arrange outpatient follow-up.  Please call us back in case of any questions.   Nigel Mormon, MD Pager: [phone] Office: [phone]

## 2021-07-04 NOTE — Progress Notes (Signed)
PROGRESS NOTE        PATIENT DETAILS Name: Bruce Little Age: 50 y.o. Sex: male Date of Birth: 10-25-1971 Admit Date: 07/01/2021 Admitting Physician Rise Patience, MD [id number], [facility]  Brief Narrative: Patient is a 50 y.o. male DM-2, CKD stage V, HTN-who presented with worsening left foot ulceration-found to have necrotizing left foot infection with underlying osteomyelitis and AKI on CKD stage V.  Significant events: 8/23>> admit for left foot necrotizing infection-osteomyelitis-AKI 8/25>> underwent left fifth ray amputation-developed transient A. fib postoperatively requiring Cardizem infusion  Significant studies: 8/24>> MRI left foot: Osteomyelitis fifth metatarsal head-possible osteomyelitis of the fourth metatarsal head cellulitis with air in the forefoot-concern for necrotizing infection.  Antimicrobial therapy: Vancomycin: 8/23>> Zosyn: 8/23>>  Microbiology data: 8/23>> blood cultures: No growth  Procedures : None  Consults: Orthopedics  DVT Prophylaxis : SCDs Start: 07/03/21 1540 SCDs Start: 07/02/21 0551   Subjective: Feels overall better-no chest pain or shortness of breath.  Assessment/Plan: Sepsis due to left foot necrotizing infection with underlying acute osteomyelitis: Sepsis physiology continues to improve-leukocytosis downtrending-cultures negative so far-discussed with orthopedics MD-continue antibiotics-await intraoperative cultures-plan is to take the patient back to the operating room for I&D on Monday.  PAF with RVR on 8/25: Developed RVR postoperatively-required Cardizem infusion.  Back in sinus rhythm.  Echo pending-has CHA2DS2-VASc score of at least 4.  Given that the A. fib was very transient and in the setting of sepsis/postop state-unclear whether we should initiate anticoagulation.  Discussed with cardiology-Dr. Vonda Antigua will evaluate and provide further recommendations.    Note-discussed  with orthopedic MD Dr. Johnsie Cancel to initiate IV heparin if needed.  AKI on CKD stage V: AKI likely hemodynamically mediated-improved with IVF and treatment of underlying sepsis physiology.  Nephrology following  Recent CVA (July 2022): Continue Plavix-if anticoagulation started for PAF-May need to stop Plavix.  DM-2 (A1c 8.4 on 8/24): CBGs relatively stable-continue Lantus 10 units-SSI-follow and adjust.  Recent Labs    07/04/21 0332 07/04/21 0617 07/04/21 1303  GLUCAP 147* 144* 181*     HTN: BP stable-continue amlodipine.  Normocytic anemia: Due to a combination of CKD stage V and acute illness.  PRBC transfusion on 8/25 while in the operating room-hemoglobin currently stable.  Continue to follow CBC.    Obesity: Estimated body mass index is 30.09 kg/m as calculated from the following:   Height as of this encounter: '6\' 2"'$  (1.88 m).   Weight as of this encounter: 106.3 kg.    Diet: Diet Order             Diet heart healthy/carb modified Room service appropriate? Yes; Fluid consistency: Thin  Diet effective now                    Code Status: Full code  Family Communication: None at bedside.  Disposition Plan: Status is: Inpatient  Remains inpatient appropriate because:Inpatient level of care appropriate due to severity of illness  Dispo: The patient is from: Home              Anticipated d/c is to: Home              Patient currently is not medically stable to d/c.   Difficult to place patient No   Barriers to Discharge: Necrotizing left foot infection with acute osteomyelitis-on IV antibiotics-left fifth ray amputation on  8/25-scheduled to go back to the OR on Monday for repeat I&D.  Remains on IV antibiotics.   Antimicrobial agents: Anti-infectives (From admission, onward)    Start     Dose/Rate Route Frequency Ordered Stop   07/04/21 0900  vancomycin (VANCOREADY) IVPB 1250 mg/250 mL        1,250 mg 166.7 mL/hr over 90 Minutes Intravenous  Once  07/04/21 0748 07/04/21 1101   07/03/21 0600  ceFAZolin (ANCEF) IVPB 3g/100 mL premix  Status:  Discontinued        3 g 200 mL/hr over 30 Minutes Intravenous To Short Stay 07/02/21 1610 07/03/21 1756   07/02/21 1400  piperacillin-tazobactam (ZOSYN) IVPB 2.25 g        2.25 g 100 mL/hr over 30 Minutes Intravenous Every 8 hours 07/02/21 0558     07/02/21 0600  vancomycin (VANCOCIN) IVPB 1000 mg/200 mL premix  Status:  Discontinued        1,000 mg 200 mL/hr over 60 Minutes Intravenous  Once 07/02/21 0553 07/02/21 0700   07/02/21 0507  vancomycin variable dose per unstable renal function (pharmacist dosing)         Does not apply See admin instructions 07/02/21 0507     07/02/21 0500  vancomycin (VANCOREADY) IVPB 2000 mg/400 mL        2,000 mg 200 mL/hr over 120 Minutes Intravenous  Once 07/02/21 0446 07/02/21 0739   07/02/21 0500  piperacillin-tazobactam (ZOSYN) IVPB 3.375 g        3.375 g 12.5 mL/hr over 240 Minutes Intravenous Once 07/02/21 0446 07/02/21 0850        Time spent: 35 minutes-Greater than 50% of this time was spent in counseling, explanation of diagnosis, planning of further management, and coordination of care.  MEDICATIONS: Scheduled Meds:  atorvastatin  80 mg Oral Daily   Chlorhexidine Gluconate Cloth  6 each Topical Daily   fenofibrate  160 mg Oral Daily   insulin aspart  0-6 Units Subcutaneous Q4H   insulin glargine-yfgn  10 Units Subcutaneous Daily   metoprolol tartrate  50 mg Oral BID   sodium bicarbonate  650 mg Oral BID   vancomycin variable dose per unstable renal function (pharmacist dosing)   Does not apply See admin instructions   Continuous Infusions:  piperacillin-tazobactam (ZOSYN)  IV 2.25 g (07/04/21 1515)   PRN Meds:.acetaminophen **OR** acetaminophen, fentaNYL (SUBLIMAZE) injection, oxyCODONE   PHYSICAL EXAM: Vital signs: Vitals:   07/04/21 0153 07/04/21 0330 07/04/21 0852 07/04/21 1120  BP: (!) 154/81 (!) 156/83 (!) 170/86 (!) 163/78   Pulse: 84 93 97 98  Resp: 20 19 (!) 21 18  Temp: 99 F (37.2 C) 98.5 F (36.9 C) 98.8 F (37.1 C) 97.6 F (36.4 C)  TempSrc: Oral Oral Oral Oral  SpO2: 95% 100% 97% 96%  Weight:      Height:       Filed Weights   07/01/21 1936 07/02/21 0434 07/03/21 1801  Weight: 111.1 kg 122 kg 106.3 kg   Body mass index is 30.09 kg/m.   Gen Exam:Alert awake-not in any distress HEENT:atraumatic, normocephalic Chest: B/L clear to auscultation anteriorly CVS:S1S2 regular Abdomen:soft non tender, non distended Extremities:no edema Neurology: Non focal Skin: no rash   I have personally reviewed following labs and imaging studies  LABORATORY DATA: CBC: Recent Labs  Lab 07/01/21 2055 07/02/21 0445 07/03/21 0024 07/03/21 1240 07/04/21 0601  WBC 26.6* 26.7* 20.3*  --  23.0*  NEUTROABS 25.5* 22.1*  --   --   --  HGB 8.4* 9.2* 7.1* 6.8* 9.9*  HCT 25.7* 27.4* 21.0* 20.0* 28.8*  MCV 84.8 84.3 83.3  --  83.5  PLT 411* 435* 334  --  381     Basic Metabolic Panel: Recent Labs  Lab 07/01/21 2055 07/02/21 0445 07/03/21 0024 07/03/21 1240 07/04/21 0601  NA 128* 130* 127* 130* 130*  K 4.1 4.1 3.8 3.7 3.9  CL 95* 93* 100 103 102  CO2 17* 21* 17*  --  15*  GLUCOSE 254* 178* 147* 181* 147*  BUN 60* 61* 58* 52* 54*  CREATININE 6.58* 6.63* 6.11* 6.20* 5.70*  CALCIUM 9.1 9.9 8.3*  --  8.9  MG  --   --  1.7  --   --   PHOS  --   --   --   --  5.2*     GFR: Estimated Creatinine Clearance: 20.4 mL/min (A) (by C-G formula based on SCr of 5.7 mg/dL (H)).  Liver Function Tests: Recent Labs  Lab 07/01/21 2055 07/02/21 0445 07/03/21 0024 07/04/21 0601  AST 80* 94* 85*  --   ALT 72* 82* 72*  --   ALKPHOS 82 90 76  --   BILITOT 0.5 0.8 0.7  --   PROT 8.0 8.4* 6.4*  --   ALBUMIN 2.7* 2.7* 1.9* 2.0*    No results for input(s): LIPASE, AMYLASE in the last 168 hours. No results for input(s): AMMONIA in the last 168 hours.  Coagulation Profile: Recent Labs  Lab 07/01/21 2055   INR 1.5*     Cardiac Enzymes: No results for input(s): CKTOTAL, CKMB, CKMBINDEX, TROPONINI in the last 168 hours.  BNP (last 3 results) No results for input(s): PROBNP in the last 8760 hours.  Lipid Profile: No results for input(s): CHOL, HDL, LDLCALC, TRIG, CHOLHDL, LDLDIRECT in the last 72 hours.  Thyroid Function Tests: No results for input(s): TSH, T4TOTAL, FREET4, T3FREE, THYROIDAB in the last 72 hours.  Anemia Panel: No results for input(s): VITAMINB12, FOLATE, FERRITIN, TIBC, IRON, RETICCTPCT in the last 72 hours.  Urine analysis:    Component Value Date/Time   COLORURINE YELLOW 07/01/2021 2036   APPEARANCEUR HAZY (A) 07/01/2021 2036   LABSPEC 1.016 07/01/2021 2036   PHURINE 5.0 07/01/2021 2036   GLUCOSEU 150 (A) 07/01/2021 2036   HGBUR MODERATE (A) 07/01/2021 2036   BILIRUBINUR NEGATIVE 07/01/2021 2036   KETONESUR NEGATIVE 07/01/2021 2036   PROTEINUR 100 (A) 07/01/2021 2036   NITRITE NEGATIVE 07/01/2021 2036   LEUKOCYTESUR NEGATIVE 07/01/2021 2036    Sepsis Labs: Lactic Acid, Venous    Component Value Date/Time   LATICACIDVEN 0.7 07/02/2021 0616    MICROBIOLOGY: Recent Results (from the past 240 hour(s))  Blood Culture (routine x 2)     Status: None (Preliminary result)   Collection Time: 07/01/21  8:45 PM   Specimen: BLOOD  Result Value Ref Range Status   Specimen Description BLOOD RIGHT ANTECUBITAL  Final   Special Requests   Final    BOTTLES DRAWN AEROBIC AND ANAEROBIC Blood Culture adequate volume   Culture   Final    NO GROWTH 3 DAYS Performed at [facility], [place] [address] [place], [place] [address]    Report Status PENDING  Incomplete  Blood Culture (routine x 2)     Status: None (Preliminary result)   Collection Time: 07/01/21  8:55 PM   Specimen: BLOOD  Result Value Ref Range Status   Specimen Description BLOOD LEFT ANTECUBITAL  Final   Special Requests  Final    BOTTLES DRAWN AEROBIC AND ANAEROBIC Blood Culture adequate volume    Culture   Final    NO GROWTH 3 DAYS Performed at [facility], [place] [address], [place], [place] [address]    Report Status PENDING  Incomplete  Resp Panel by RT-PCR (Flu A&B, Covid) Nasopharyngeal Swab     Status: None   Collection Time: 07/02/21 12:51 AM   Specimen: Nasopharyngeal Swab; Nasopharyngeal(NP) swabs in vial transport medium  Result Value Ref Range Status   SARS Coronavirus 2 by RT PCR NEGATIVE NEGATIVE Final    Comment: (NOTE) SARS-CoV-2 target nucleic acids are NOT DETECTED.  The SARS-CoV-2 RNA is generally detectable in upper respiratory specimens during the acute phase of infection. The lowest concentration of SARS-CoV-2 viral copies this assay can detect is 138 copies/mL. A negative result does not preclude SARS-Cov-2 infection and should not be used as the sole basis for treatment or other patient management decisions. A negative result may occur with  improper specimen collection/handling, submission of specimen other than nasopharyngeal swab, presence of viral mutation(s) within the areas targeted by this assay, and inadequate number of viral copies(<138 copies/mL). A negative result must be combined with clinical observations, patient history, and epidemiological information. The expected result is Negative.  Fact Sheet for Patients:  EntrepreneurPulse.com.au  Fact Sheet for Healthcare Providers:  IncredibleEmployment.be  This test is no t yet approved or cleared by the [place] FDA and  has been authorized for detection and/or diagnosis of SARS-CoV-2 by FDA under an Emergency Use Authorization (EUA). This EUA will remain  in effect (meaning this test can be used) for the duration of the COVID-19 declaration under Section 564(b)(1) of the Act, 21 U.S.C.section 360bbb-3(b)(1), unless the authorization is terminated  or revoked sooner.       Influenza A by PCR NEGATIVE NEGATIVE Final   Influenza B by PCR  NEGATIVE NEGATIVE Final    Comment: (NOTE) The Xpert Xpress SARS-CoV-2/FLU/RSV plus assay is intended as an aid in the diagnosis of influenza from Nasopharyngeal swab specimens and should not be used as a sole basis for treatment. Nasal washings and aspirates are unacceptable for Xpert Xpress SARS-CoV-2/FLU/RSV testing.  Fact Sheet for Patients: EntrepreneurPulse.com.au  Fact Sheet for Healthcare Providers: IncredibleEmployment.be  This test is not yet approved or cleared by the [place] FDA and has been authorized for detection and/or diagnosis of SARS-CoV-2 by FDA under an Emergency Use Authorization (EUA). This EUA will remain in effect (meaning this test can be used) for the duration of the COVID-19 declaration under Section 564(b)(1) of the Act, 21 U.S.C. section 360bbb-3(b)(1), unless the authorization is terminated or revoked.  Performed at [facility], [place] [address], [place], [place] [address]   Urine Culture     Status: None   Collection Time: 07/02/21  2:55 AM   Specimen: In/Out Cath Urine  Result Value Ref Range Status   Specimen Description IN/OUT CATH URINE  Final   Special Requests NONE  Final   Culture   Final    NO GROWTH Performed at [facility], [place] [address], [place], [place] [address]    Report Status 07/03/2021 FINAL  Final  Surgical pcr screen     Status: None   Collection Time: 07/02/21  8:00 PM   Specimen: Nasal Mucosa; Nasal Swab  Result Value Ref Range Status   MRSA, PCR NEGATIVE NEGATIVE Final   Staphylococcus aureus NEGATIVE NEGATIVE Final    Comment: (  NOTE) The Xpert SA Assay (FDA approved for NASAL specimens in patients 27 years of age and older), is one component of a comprehensive surveillance program. It is not intended to diagnose infection nor to guide or monitor treatment. Performed at [facility], [place] [address], [place], [place] [address]   Aerobic/Anaerobic Culture w  Gram Stain (surgical/deep wound)     Status: None (Preliminary result)   Collection Time: 07/03/21  3:18 PM   Specimen: Soft Tissue, Other  Result Value Ref Range Status   Specimen Description TISSUE LEFT FOOT  Final   Special Requests NONE  Final   Gram Stain   Final    FEW SQUAMOUS EPITHELIAL CELLS PRESENT MODERATE WBC SEEN NO ORGANISMS SEEN    Culture   Final    CULTURE REINCUBATED FOR BETTER GROWTH Performed at [facility], [place] [address] [place], [place] [address]    Report Status PENDING  Incomplete    RADIOLOGY STUDIES/RESULTS: No results found.   LOS: 2 days   Oren Binet, MD  [facility]    To contact the attending provider between 7A-7P or the covering provider during after hours 7P-7A, please log into the web site [email or web address] and access using universal [place] password for that web site. If you do not have the password, please call the hospital operator.  07/04/2021, 3:24 PM

## 2021-07-04 NOTE — Progress Notes (Signed)
Patient has converted back to normal sinus rhythm.  Informed on call physician.  Will continue to monitor.  Lupita Dawn, RN

## 2021-07-04 NOTE — Progress Notes (Signed)
Pharmacy Antibiotic Note  Bruce Little is a 50 y.o. male admitted on 07/01/2021 with nectrotizing left foot infection. Pharmacy has been consulted for Vancomycin and Zosyn dosing. S/p debridement and amputation of 5th toe on 8/25. More debridement planned on 8/29.   CKD stage V, creatinine up from baseline but some trend down. Vancomycin 2gm IV given on 8/24 and planning intermittent doses based on renal function and vancomycin levels.  Random level this morning 12 mcg/ml.   Plan: Vancomycin 1250 mg IV x 1 today. Will follow renal function and plan to re-dose intermittently when Vanc levels are 15-20 mcg/ml. Current request is for 7 days of Vancomycin but can extend if indicated. Continue Zosyn 2.25 gm IV q8hrs. Will follow culture data, clinical progress, antibiotic plans.   Height: '6\' 2"'$  (188 cm) Weight: 106.3 kg (234 lb 5.6 oz) IBW/kg (Calculated) : 82.2  Temp (24hrs), Avg:98.5 F (36.9 C), Min:97.6 F (36.4 C), Max:99.3 F (37.4 C)  Recent Labs  Lab 07/01/21 2055 07/02/21 0445 07/02/21 0616 07/03/21 0024 07/03/21 1240 07/04/21 0601  WBC 26.6* 26.7*  --  20.3*  --  23.0*  CREATININE 6.58* 6.63*  --  6.11* 6.20* 5.70*  LATICACIDVEN 1.2 1.1 0.7  --   --   --   VANCORANDOM  --   --   --   --   --  12    Estimated Creatinine Clearance: 20.4 mL/min (A) (by C-G formula based on SCr of 5.7 mg/dL (H)).    No Known Allergies  Antimicrobials this admission: Vancomycin 8/24 >> (8/30) Zosyn 8/24 >>  Dose adjustments this admission:  8/26: vanc random level 12 mcg/ml > Vanc 1250 mg IV x 1 given  Microbiology results: 8/24 Blood: no growth x 3 days to date 8/24 urine: negative 8/24 MRSA PCR: negative 8/24 COVID and flu: negative 8/25 left foot tissue: no organisms on gram stain, reincubated   Thank you for allowing pharmacy to be a part of this patient's care.  Arty Baumgartner, [place] 07/04/2021 3:55 PM

## 2021-07-04 NOTE — Progress Notes (Addendum)
ANTICOAGULATION CONSULT NOTE - Initial Consult  Pharmacy Consult for IV Heparin Indication: atrial fibrillation  No Known Allergies  Patient Measurements: Height: '6\' 2"'$  (188 cm) Weight: 106.3 kg (234 lb 5.6 oz) IBW/kg (Calculated) : 82.2 Heparin Dosing Weight: 103.8 kg  Vital Signs: Temp: 97.6 F (36.4 C) (08/26 1120) Temp Source: Oral (08/26 1120) BP: 163/78 (08/26 1120) Pulse Rate: 98 (08/26 1120)  Labs: Recent Labs    07/01/21 2055 07/02/21 0445 07/03/21 0024 07/03/21 1240 07/04/21 0601  HGB 8.4* 9.2* 7.1* 6.8* 9.9*  HCT 25.7* 27.4* 21.0* 20.0* 28.8*  PLT 411* 435* 334  --  381  APTT 34  --   --   --   --   LABPROT 18.2*  --   --   --   --   INR 1.5*  --   --   --   --   CREATININE 6.58* 6.63* 6.11* 6.20* 5.70*    Estimated Creatinine Clearance: 20.4 mL/min (A) (by C-G formula based on SCr of 5.7 mg/dL (H)).  Medical History: Past Medical History:  Diagnosis Date   DM2 (diabetes mellitus, type 2) ([place])    HTN (hypertension)     Assessment: 50 yr old man with CKD stage V was admitted on 07/01/21 with diabetic foot infection, S/P excisional debridement (including skin, SQ tissues, muscle, tendon, bone) and amputation of 5th L toe on 8/25 evening, with further debridement planned for 8/29. Pt developed atrial fibrillation with RVR postoperatively requiring diltiazem infusion. Cardiology has consulted pharmacy to dose IV heparin for atrial fibrillation. Pt was on no anticoagulants PTA or during this admission (was on Plavix, which is on hold for procedures).  H/H  9.9/28.8, plt 381; INR (8/23) 1.5. Per RN, no bleeding observed post procedure.  Goal of Therapy:  Heparin level 0.3-0.7 units/ml Monitor platelets by anticoagulation protocol: Yes   Plan:  Start heparin infusion at 1450 units/hr (will omit bolus since pt <24 hrs out from surgery) Check 8-hr heparin level Monitor daily heparin level, CBC Monitor for bleeding F/U plans for further debridement on  Monday, 8.29  Gillermina Hu, PharmD, BCPS, [facility] Clinical Pharmacist 07/04/2021,4:09 PM

## 2021-07-04 NOTE — Progress Notes (Signed)
Subjective: 1 Day Post-Op Procedure(s) (LRB): IRRIGATION AND DEBRIDEMENT ,FIFTH RAY  AMPUTATION LEFT FOOT, , WOUND VAC PLACEMENT (Left) Patient reports pain as mild.  No pain to left foot this am  Objective: Vital signs in last 24 hours: Temp:  [97.6 F (36.4 C)-101.1 F (38.4 C)] 98.5 F (36.9 C) (08/26 0330) Pulse Rate:  [37-166] 93 (08/26 0330) Resp:  [12-22] 19 (08/26 0330) BP: (113-173)/(72-113) 156/83 (08/26 0330) SpO2:  [93 %-100 %] 100 % (08/26 0330) Weight:  [106.3 kg] 106.3 kg (08/25 1801)  Intake/Output from previous day: 08/25 0701 - 08/26 0700 In: 1229.2 [P.O.:150; I.V.:349.2; Blood:630; IV Piggyback:100] Out: [id number] [Urine:1200; Drains:25; Blood:20] Intake/Output this shift: No intake/output data recorded.  Recent Labs    07/01/21 2055 07/02/21 0445 07/03/21 0024 07/03/21 1240 07/04/21 0601  HGB 8.4* 9.2* 7.1* 6.8* 9.9*   Recent Labs    07/03/21 0024 07/03/21 1240 07/04/21 0601  WBC 20.3*  --  23.0*  RBC 2.52*  --  3.45*  HCT 21.0* 20.0* 28.8*  PLT 334  --  381   Recent Labs    07/03/21 0024 07/03/21 1240 07/04/21 0601  NA 127* 130* 130*  K 3.8 3.7 3.9  CL 100 103 102  CO2 17*  --  15*  BUN 58* 52* 54*  CREATININE 6.11* 6.20* 5.70*  GLUCOSE 147* 181* 147*  CALCIUM 8.3*  --  8.9   Recent Labs    07/01/21 2055  INR 1.5*    Neurologically intact Sensation intact distally Wound vac in place and functioning properly.  25 cc blood in canister   Assessment/Plan: 1 Day Post-Op Procedure(s) (LRB): IRRIGATION AND DEBRIDEMENT ,FIFTH RAY  AMPUTATION LEFT FOOT, , WOUND VAC PLACEMENT (Left) PLAN NWB LLE Continue wound vac Continue broad spectrum abx per primary/ID Plan to return to OR Monday for repeat I&D.  Please make NPO after midnight [place] 07/04/2021, 8:13 AM

## 2021-07-05 LAB — BASIC METABOLIC PANEL
Anion gap: 10 (ref 5–15)
BUN: 57 mg/dL — ABNORMAL HIGH (ref 6–20)
CO2: 16 mmol/L — ABNORMAL LOW (ref 22–32)
Calcium: 8.4 mg/dL — ABNORMAL LOW (ref 8.9–10.3)
Chloride: 103 mmol/L (ref 98–111)
Creatinine, Ser: 5.47 mg/dL — ABNORMAL HIGH (ref 0.61–1.24)
GFR, Estimated: 12 mL/min — ABNORMAL LOW (ref >60)
Glucose, Bld: 179 mg/dL — ABNORMAL HIGH (ref 70–99)
Potassium: 4.2 mmol/L (ref 3.5–5.1)
Sodium: 129 mmol/L — ABNORMAL LOW (ref 135–145)

## 2021-07-05 LAB — HEPARIN LEVEL (UNFRACTIONATED)
Heparin Unfractionated: 0.17 IU/mL — ABNORMAL LOW (ref 0.30–0.70)
Heparin Unfractionated: <0.1 IU/mL — ABNORMAL LOW (ref 0.30–0.70)
Heparin Unfractionated: <0.1 IU/mL — ABNORMAL LOW (ref 0.30–0.70)

## 2021-07-05 LAB — GLUCOSE, CAPILLARY
Glucose-Capillary: 215 mg/dL — ABNORMAL HIGH (ref 70–99)
Glucose-Capillary: 158 mg/dL — ABNORMAL HIGH (ref 70–99)
Glucose-Capillary: 202 mg/dL — ABNORMAL HIGH (ref 70–99)
Glucose-Capillary: 185 mg/dL — ABNORMAL HIGH (ref 70–99)

## 2021-07-05 LAB — CBC
HCT: 26.7 % — ABNORMAL LOW (ref 39.0–52.0)
Hemoglobin: 9 g/dL — ABNORMAL LOW (ref 13.0–17.0)
MCH: 28.4 pg (ref 26.0–34.0)
MCHC: 33.7 g/dL (ref 30.0–36.0)
MCV: 84.2 fL (ref 80.0–100.0)
Platelets: 385 10*3/uL (ref 150–400)
RBC: 3.17 MIL/uL — ABNORMAL LOW (ref 4.22–5.81)
RDW: 13.2 % (ref 11.5–15.5)
WBC: 18.8 10*3/uL — ABNORMAL HIGH (ref 4.0–10.5)
nRBC: 0 % (ref 0.0–0.2)

## 2021-07-05 LAB — TSH: TSH: 2.791 u[IU]/mL (ref 0.350–4.500)

## 2021-07-05 LAB — VANCOMYCIN, RANDOM: Vancomycin Rm: 21

## 2021-07-05 MED ORDER — VANCOMYCIN HCL IN DEXTROSE 1-5 GM/200ML-% IV SOLN
1000.0000 mg | Freq: Once | INTRAVENOUS | Status: AC
  Administered 2021-07-05: 1000 mg via INTRAVENOUS
  Filled 2021-07-05: qty 200

## 2021-07-05 MED ORDER — POLYETHYLENE GLYCOL 3350 17 G PO PACK
17.0000 g | PACK | Freq: Every day | ORAL | Status: DC
  Administered 2021-07-05: 17 g via ORAL
  Filled 2021-07-05 (×6): qty 1

## 2021-07-05 MED ORDER — PIPERACILLIN-TAZOBACTAM 3.375 G IVPB
3.3750 g | Freq: Three times a day (TID) | INTRAVENOUS | Status: DC
  Administered 2021-07-05 – 2021-07-07 (×7): 3.375 g via INTRAVENOUS
  Filled 2021-07-05 (×7): qty 50

## 2021-07-05 NOTE — Progress Notes (Addendum)
PROGRESS NOTE        PATIENT DETAILS Name: Bruce Little Age: 50 y.o. Sex: male Date of Birth: 1971/01/30 Admit Date: 07/01/2021 Admitting Physician Rise Patience, MD [id number], [facility]  Brief Narrative: Patient is a 50 y.o. male DM-2, CKD stage V, HTN-who presented with worsening left foot ulceration-found to have necrotizing left foot infection with underlying osteomyelitis and AKI on CKD stage V.  Evaluated by orthopedics-underwent debridement and subsequent left fifth ray amputation-postoperatively-went into A. fib with RVR (briefly).  Orthopedic plans on repeat incision/debridement on 8/29.  See below for further details.  Significant events: 8/23>> admit for left foot necrotizing infection-osteomyelitis-AKI 8/25>> underwent left fifth ray amputation-developed transient A. fib postoperatively requiring Cardizem infusion  Significant studies: 8/24>> MRI left foot: Osteomyelitis fifth metatarsal head-possible osteomyelitis of the fourth metatarsal head cellulitis with air in the forefoot-concern for necrotizing infection. 8/26>> Echo: EF 55-60%.  Antimicrobial therapy: Vancomycin: 8/23>> Zosyn: 8/23>>  Microbiology data: 8/23>> blood cultures: No growth 8/24>> urine culture: No growth 8/25>> Tissue culture (intraoperative): No growth  Procedures : 8/25>> excisional debridement,left foot fifth ray amputation-application of wound VAC.   Consults: Orthopedics, nephrology, cardiology ([place])  DVT Prophylaxis : SCDs Start: 07/03/21 1540 SCDs Start: 07/02/21 0551 V heparin.   Subjective: Feels overall better-no chest pain or shortness of breath.  Assessment/Plan: Sepsis due to left foot necrotizing infection with underlying acute osteomyelitis: Sepsis physiology has resolved-leukocytosis downtrending-cultures negative so far.  Remains on empiric Vanco/Zosyn-orthopedics planning on repeat I&D on 8/29.   PAF with RVR on  8/25: Developed RVR postoperatively on 8/25-briefly required Cardizem infusion.  Maintaining sinus rhythm-currently on metoprolol.  Echo with preserved EF-however CHA2DS2-VASc score from 4-after discussion with cardiology-currently on IV heparin with plans to transition to Eliquis on discharge.  Cardiology (Dr. Virgina Jock) will arrange for outpatient follow-up in his office.  Heparin drip will need to be placed on hold early Monday morning in anticipation of repeat I&D.   AKI on CKD stage V: AKI likely hemodynamically mediated-renal function has improved with IVF and now close to baseline.  Recent renal biopsy showed diabetic nephropathy.  No further recommendations from nephrology apart from outpatient follow-up with his primary nephrologist.   Non anion gap metabolic acidosis: Due to CKD-has been started on oral bicarb-would give it a few more days before adjusting dosage.  Hyponatremia: Mild-levels fluctuating-patient is asymptomatic-watch closely for now.  Will initiate work-up or treatment if it worsens significantly.  Recent CVA (July 2022): No longer on Plavix-as will be on anticoagulation.  Nonfocal exam currently.    DM-2 (A1c 8.4 on 8/24): CBGs relatively stable-continue Lantus 10 units-SSI-follow and adjust.  Recent Labs    07/04/21 1635 07/04/21 2107 07/05/21 0619  GLUCAP 258* 192* 215*     HTN: BP on the higher side-has just been switched to metoprolol due to A. fib (previously on amlodipine.  We will await another 24 hours before adjusting-reassess on 8/28.  Normocytic anemia: Due to a combination of CKD stage V and acute illness.  PRBC transfusion on 8/25 while in the operating room-hemoglobin currently stable.  Continue to follow CBC.    Obesity: Estimated body mass index is 30.09 kg/m as calculated from the following:   Height as of this encounter: '6\' 2"'$  (1.88 m).   Weight as of this encounter: 106.3 kg.    Diet: Diet Order  Diet heart healthy/carb  modified Room service appropriate? Yes; Fluid consistency: Thin  Diet effective now                    Code Status: Full code  Family Communication: None at bedside.  Disposition Plan: Status is: Inpatient  Remains inpatient appropriate because:Inpatient level of care appropriate due to severity of illness  Dispo: The patient is from: Home              Anticipated d/c is to: Home              Patient currently is not medically stable to d/c.   Difficult to place patient No   Barriers to Discharge: Necrotizing left foot infection with acute osteomyelitis-on IV antibiotics-left fifth ray amputation on 8/25-scheduled to go back to the OR on Monday for repeat I&D.    Antimicrobial agents: Anti-infectives (From admission, onward)    Start     Dose/Rate Route Frequency Ordered Stop   07/05/21 1400  piperacillin-tazobactam (ZOSYN) IVPB 3.375 g        3.375 g 12.5 mL/hr over 240 Minutes Intravenous Every 8 hours 07/05/21 0831     07/04/21 0900  vancomycin (VANCOREADY) IVPB 1250 mg/250 mL        1,250 mg 166.7 mL/hr over 90 Minutes Intravenous  Once 07/04/21 0748 07/04/21 1101   07/03/21 0600  ceFAZolin (ANCEF) IVPB 3g/100 mL premix  Status:  Discontinued        3 g 200 mL/hr over 30 Minutes Intravenous To Short Stay 07/02/21 1610 07/03/21 1756   07/02/21 1400  piperacillin-tazobactam (ZOSYN) IVPB 2.25 g  Status:  Discontinued        2.25 g 100 mL/hr over 30 Minutes Intravenous Every 8 hours 07/02/21 0558 07/05/21 0831   07/02/21 0600  vancomycin (VANCOCIN) IVPB 1000 mg/200 mL premix  Status:  Discontinued        1,000 mg 200 mL/hr over 60 Minutes Intravenous  Once 07/02/21 0553 07/02/21 0700   07/02/21 0507  vancomycin variable dose per unstable renal function (pharmacist dosing)         Does not apply See admin instructions 07/02/21 0507     07/02/21 0500  vancomycin (VANCOREADY) IVPB 2000 mg/400 mL        2,000 mg 200 mL/hr over 120 Minutes Intravenous  Once 07/02/21 0446  07/02/21 0739   07/02/21 0500  piperacillin-tazobactam (ZOSYN) IVPB 3.375 g        3.375 g 12.5 mL/hr over 240 Minutes Intravenous Once 07/02/21 0446 07/02/21 0850        Time spent: 35 minutes-Greater than 50% of this time was spent in counseling, explanation of diagnosis, planning of further management, and coordination of care.  MEDICATIONS: Scheduled Meds:  atorvastatin  80 mg Oral Daily   Chlorhexidine Gluconate Cloth  6 each Topical Daily   fenofibrate  160 mg Oral Daily   insulin aspart  0-6 Units Subcutaneous TID WC   insulin glargine-yfgn  10 Units Subcutaneous Daily   metoprolol tartrate  50 mg Oral BID   sodium bicarbonate  650 mg Oral BID   vancomycin variable dose per unstable renal function (pharmacist dosing)   Does not apply See admin instructions   Continuous Infusions:  heparin 1,700 Units/hr (07/05/21 0134)   piperacillin-tazobactam (ZOSYN)  IV     PRN Meds:.acetaminophen **OR** acetaminophen, fentaNYL (SUBLIMAZE) injection, oxyCODONE   PHYSICAL EXAM: Vital signs: Vitals:   07/04/21 1925 07/04/21 2317 07/05/21 0354 07/05/21 0719  BP: (!) 146/91 (!) 160/90 (!) 154/64 (!) 160/88  Pulse: 83 91 86 84  Resp: '13 20 20 17  '$ Temp: 98.5 F (36.9 C) 98.2 F (36.8 C) 98.7 F (37.1 C) 98.6 F (37 C)  TempSrc: Oral Oral Oral Oral  SpO2: 95% 91% 90% 92%  Weight:      Height:       Filed Weights   07/01/21 1936 07/02/21 0434 07/03/21 1801  Weight: 111.1 kg 122 kg 106.3 kg   Body mass index is 30.09 kg/m.   Gen Exam:Alert awake-not in any distress HEENT:atraumatic, normocephalic Chest: B/L clear to auscultation anteriorly CVS:S1S2 regular Abdomen:soft non tender, non distended Extremities:no edema-left foot wound dry-wound VAC in place. Neurology: Non focal Skin: no rash   I have personally reviewed following labs and imaging studies  LABORATORY DATA: CBC: Recent Labs  Lab 07/01/21 2055 07/02/21 0445 07/03/21 0024 07/03/21 1240 07/04/21 0601  07/05/21 0028  WBC 26.6* 26.7* 20.3*  --  23.0* 18.8*  NEUTROABS 25.5* 22.1*  --   --   --   --   HGB 8.4* 9.2* 7.1* 6.8* 9.9* 9.0*  HCT 25.7* 27.4* 21.0* 20.0* 28.8* 26.7*  MCV 84.8 84.3 83.3  --  83.5 84.2  PLT 411* 435* 334  --  381 385     Basic Metabolic Panel: Recent Labs  Lab 07/01/21 2055 07/02/21 0445 07/03/21 0024 07/03/21 1240 07/04/21 0601 07/05/21 0028  NA 128* 130* 127* 130* 130* 129*  K 4.1 4.1 3.8 3.7 3.9 4.2  CL 95* 93* 100 103 102 103  CO2 17* 21* 17*  --  15* 16*  GLUCOSE 254* 178* 147* 181* 147* 179*  BUN 60* 61* 58* 52* 54* 57*  CREATININE 6.58* 6.63* 6.11* 6.20* 5.70* 5.47*  CALCIUM 9.1 9.9 8.3*  --  8.9 8.4*  MG  --   --  1.7  --   --   --   PHOS  --   --   --   --  5.2*  --      GFR: Estimated Creatinine Clearance: 21.2 mL/min (A) (by C-G formula based on SCr of 5.47 mg/dL (H)).  Liver Function Tests: Recent Labs  Lab 07/01/21 2055 07/02/21 0445 07/03/21 0024 07/04/21 0601  AST 80* 94* 85*  --   ALT 72* 82* 72*  --   ALKPHOS 82 90 76  --   BILITOT 0.5 0.8 0.7  --   PROT 8.0 8.4* 6.4*  --   ALBUMIN 2.7* 2.7* 1.9* 2.0*    No results for input(s): LIPASE, AMYLASE in the last 168 hours. No results for input(s): AMMONIA in the last 168 hours.  Coagulation Profile: Recent Labs  Lab 07/01/21 2055  INR 1.5*     Cardiac Enzymes: No results for input(s): CKTOTAL, CKMB, CKMBINDEX, TROPONINI in the last 168 hours.  BNP (last 3 results) No results for input(s): PROBNP in the last 8760 hours.  Lipid Profile: No results for input(s): CHOL, HDL, LDLCALC, TRIG, CHOLHDL, LDLDIRECT in the last 72 hours.  Thyroid Function Tests: Recent Labs    07/05/21 0028  TSH 2.791    Anemia Panel: No results for input(s): VITAMINB12, FOLATE, FERRITIN, TIBC, IRON, RETICCTPCT in the last 72 hours.  Urine analysis:    Component Value Date/Time   COLORURINE YELLOW 07/01/2021 2036   APPEARANCEUR HAZY (A) 07/01/2021 2036   LABSPEC 1.016  07/01/2021 2036   PHURINE 5.0 07/01/2021 2036   GLUCOSEU 150 (A) 07/01/2021 2036   HGBUR MODERATE (  A) 07/01/2021 2036   BILIRUBINUR NEGATIVE 07/01/2021 2036   KETONESUR NEGATIVE 07/01/2021 2036   PROTEINUR 100 (A) 07/01/2021 2036   NITRITE NEGATIVE 07/01/2021 2036   LEUKOCYTESUR NEGATIVE 07/01/2021 2036    Sepsis Labs: Lactic Acid, Venous    Component Value Date/Time   LATICACIDVEN 0.7 07/02/2021 [id number]    MICROBIOLOGY: Recent Results (from the past 240 hour(s))  Blood Culture (routine x 2)     Status: None (Preliminary result)   Collection Time: 07/01/21  8:45 PM   Specimen: BLOOD  Result Value Ref Range Status   Specimen Description BLOOD RIGHT ANTECUBITAL  Final   Special Requests   Final    BOTTLES DRAWN AEROBIC AND ANAEROBIC Blood Culture adequate volume   Culture   Final    NO GROWTH 3 DAYS Performed at [facility], [place] [address] [place], [place] [address]    Report Status PENDING  Incomplete  Blood Culture (routine x 2)     Status: None (Preliminary result)   Collection Time: 07/01/21  8:55 PM   Specimen: BLOOD  Result Value Ref Range Status   Specimen Description BLOOD LEFT ANTECUBITAL  Final   Special Requests   Final    BOTTLES DRAWN AEROBIC AND ANAEROBIC Blood Culture adequate volume   Culture   Final    NO GROWTH 3 DAYS Performed at [facility], [place] [address] [place], [place] [address]    Report Status PENDING  Incomplete  Resp Panel by RT-PCR (Flu A&B, Covid) Nasopharyngeal Swab     Status: None   Collection Time: 07/02/21 12:51 AM   Specimen: Nasopharyngeal Swab; Nasopharyngeal(NP) swabs in vial transport medium  Result Value Ref Range Status   SARS Coronavirus 2 by RT PCR NEGATIVE NEGATIVE Final    Comment: (NOTE) SARS-CoV-2 target nucleic acids are NOT DETECTED.  The SARS-CoV-2 RNA is generally detectable in upper respiratory specimens during the acute phase of infection. The lowest concentration of SARS-CoV-2 viral copies this  assay can detect is 138 copies/mL. A negative result does not preclude SARS-Cov-2 infection and should not be used as the sole basis for treatment or other patient management decisions. A negative result may occur with  improper specimen collection/handling, submission of specimen other than nasopharyngeal swab, presence of viral mutation(s) within the areas targeted by this assay, and inadequate number of viral copies(<138 copies/mL). A negative result must be combined with clinical observations, patient history, and epidemiological information. The expected result is Negative.  Fact Sheet for Patients:  EntrepreneurPulse.com.au  Fact Sheet for Healthcare Providers:  IncredibleEmployment.be  This test is no t yet approved or cleared by the [place] FDA and  has been authorized for detection and/or diagnosis of SARS-CoV-2 by FDA under an Emergency Use Authorization (EUA). This EUA will remain  in effect (meaning this test can be used) for the duration of the COVID-19 declaration under Section 564(b)(1) of the Act, 21 U.S.C.section 360bbb-3(b)(1), unless the authorization is terminated  or revoked sooner.       Influenza A by PCR NEGATIVE NEGATIVE Final   Influenza B by PCR NEGATIVE NEGATIVE Final    Comment: (NOTE) The Xpert Xpress SARS-CoV-2/FLU/RSV plus assay is intended as an aid in the diagnosis of influenza from Nasopharyngeal swab specimens and should not be used as a sole basis for treatment. Nasal washings and aspirates are unacceptable for Xpert Xpress SARS-CoV-2/FLU/RSV testing.  Fact Sheet for Patients: EntrepreneurPulse.com.au  Fact Sheet for Healthcare Providers: IncredibleEmployment.be  This test is not  yet approved or cleared by the [place] and has been authorized for detection and/or diagnosis of SARS-CoV-2 by FDA under an Emergency Use Authorization (EUA). This EUA will  remain in effect (meaning this test can be used) for the duration of the COVID-19 declaration under Section 564(b)(1) of the Act, 21 U.S.C. section 360bbb-3(b)(1), unless the authorization is terminated or revoked.  Performed at [facility], [place] [address] [place], [place] [address]   Urine Culture     Status: None   Collection Time: 07/02/21  2:55 AM   Specimen: In/Out Cath Urine  Result Value Ref Range Status   Specimen Description IN/OUT CATH URINE  Final   Special Requests NONE  Final   Culture   Final    NO GROWTH Performed at [facility], [place] [address], [place], [place] [address]    Report Status 07/03/2021 FINAL  Final  Surgical pcr screen     Status: None   Collection Time: 07/02/21  8:00 PM   Specimen: Nasal Mucosa; Nasal Swab  Result Value Ref Range Status   MRSA, PCR NEGATIVE NEGATIVE Final   Staphylococcus aureus NEGATIVE NEGATIVE Final    Comment: (NOTE) The Xpert SA Assay (FDA approved for NASAL specimens in patients 50 years of age and older), is one component of a comprehensive surveillance program. It is not intended to diagnose infection nor to guide or monitor treatment. Performed at [facility], [place] [address] [place], [place] [address]   Aerobic/Anaerobic Culture w Gram Stain (surgical/deep wound)     Status: None (Preliminary result)   Collection Time: 07/03/21  3:18 PM   Specimen: Soft Tissue, Other  Result Value Ref Range Status   Specimen Description TISSUE LEFT FOOT  Final   Special Requests NONE  Final   Gram Stain   Final    FEW SQUAMOUS EPITHELIAL CELLS PRESENT MODERATE WBC SEEN NO ORGANISMS SEEN    Culture   Final    CULTURE REINCUBATED FOR BETTER GROWTH Performed at [facility], [place] [address] [place], [place] [address]    Report Status PENDING  Incomplete    RADIOLOGY STUDIES/RESULTS: ECHOCARDIOGRAM COMPLETE  Result Date: 07/04/2021    ECHOCARDIOGRAM REPORT   Patient Name:   DRAVIN SCHELLIN Date of  Exam: 07/04/2021 Medical Rec #:  [id number]    Height:       74.0 in Accession #:    [id number]   Weight:       234.3 lb Date of Birth:  06-Aug-1971    BSA:          2.325 m Patient Age:    77 years     BP:           163/78 mmHg Patient Gender: M            HR:           99 bpm. Exam Location:  Inpatient Procedure: 2D Echo, Cardiac Doppler and Color Doppler Indications:    Atrial fibrilation, DM, HTN.  History:        Patient has prior history of Echocardiogram examinations, most                 recent 05/20/2021.  Sonographer:    Tawnya Crook Referring Phys: [place]  1. Left ventricular ejection fraction, by estimation, is 55 to 60%. The left ventricle has normal function. The left ventricle has no regional wall motion abnormalities. There  is mild left ventricular hypertrophy. Left ventricular diastolic parameters were normal.  2. Right ventricular systolic function is normal. The right ventricular size is normal. Tricuspid regurgitation signal is inadequate for assessing PA pressure.  3. The mitral valve is normal in structure. Mild mitral valve regurgitation. No evidence of mitral stenosis.  4. The aortic valve is grossly normal. Aortic valve regurgitation is trivial. No aortic stenosis is present.  5. The inferior vena cava is normal in size with greater than 50% respiratory variability, suggesting right atrial pressure of 3 mmHg. FINDINGS  Left Ventricle: Left ventricular ejection fraction, by estimation, is 55 to 60%. The left ventricle has normal function. The left ventricle has no regional wall motion abnormalities. The left ventricular internal cavity size was normal in size. There is  mild left ventricular hypertrophy. Left ventricular diastolic parameters were normal. Right Ventricle: The right ventricular size is normal. No increase in right ventricular wall thickness. Right ventricular systolic function is normal. Tricuspid regurgitation signal is inadequate for assessing PA  pressure. Left Atrium: Left atrial size was normal in size. Right Atrium: Right atrial size was normal in size. Pericardium: Trivial pericardial effusion is present. Mitral Valve: The mitral valve is normal in structure. Mild mitral valve regurgitation. No evidence of mitral valve stenosis. Tricuspid Valve: The tricuspid valve is normal in structure. Tricuspid valve regurgitation is trivial. No evidence of tricuspid stenosis. Aortic Valve: The aortic valve is grossly normal. Aortic valve regurgitation is trivial. No aortic stenosis is present. Aortic valve mean gradient measures 5.0 mmHg. Aortic valve peak gradient measures 9.7 mmHg. Aortic valve area, by VTI measures 3.44 cm. Pulmonic Valve: The pulmonic valve was normal in structure. Pulmonic valve regurgitation is not visualized. No evidence of pulmonic stenosis. Aorta: The aortic root is normal in size and structure. Venous: The inferior vena cava is normal in size with greater than 50% respiratory variability, suggesting right atrial pressure of 3 mmHg. IAS/Shunts: No atrial level shunt detected by color flow Doppler.  LEFT VENTRICLE PLAX 2D LVIDd:         5.60 cm     Diastology LVIDs:         3.50 cm     LV e' medial:  11.30 cm/s LV PW:         1.40 cm     LV e' lateral: 10.10 cm/s LV IVS:        1.30 cm LVOT diam:     2.40 cm LV SV:         100 LV SV Index:   43 LVOT Area:     4.52 cm  LV Volumes (MOD) LV vol d, MOD A4C: 93.0 ml LV vol s, MOD A4C: 41.7 ml LV SV MOD A4C:     93.0 ml RIGHT VENTRICLE             IVC RV S prime:     11.90 cm/s  IVC diam: 2.00 cm TAPSE (M-mode): 2.2 cm LEFT ATRIUM            Index       RIGHT ATRIUM           Index LA diam:      4.30 cm  1.85 cm/m  RA Area:     19.10 cm LA Vol (A2C): 142.0 ml 61.08 ml/m RA Volume:   52.60 ml  22.62 ml/m LA Vol (A4C): 55.3 ml  23.79 ml/m  AORTIC VALVE  PULMONIC VALVE AV Area (Vmax):    3.42 cm     PV Vmax:       0.87 m/s AV Area (Vmean):   3.39 cm     PV Peak grad:  3.0  mmHg AV Area (VTI):     3.44 cm AV Vmax:           156.00 cm/s AV Vmean:          109.000 cm/s AV VTI:            0.289 m AV Peak Grad:      9.7 mmHg AV Mean Grad:      5.0 mmHg LVOT Vmax:         118.00 cm/s LVOT Vmean:        81.600 cm/s LVOT VTI:          0.220 m LVOT/AV VTI ratio: 0.76  AORTA Ao Root diam: 3.40 cm Ao Asc diam:  3.30 cm MR Peak grad: 91.4 mmHg MR Vmax:      478.00 cm/s  SHUNTS MV A velocity: 77.10 cm/s  Systemic VTI:  0.22 m                            Systemic Diam: 2.40 cm Cherlynn Kaiser MD Electronically signed by Cherlynn Kaiser MD Signature Date/Time: 07/04/2021/4:07:53 PM    Final      LOS: 3 days   Oren Binet, MD  [facility]    To contact the attending provider between 7A-7P or the covering provider during after hours 7P-7A, please log into the web site [email or web address] and access using universal [place] password for that web site. If you do not have the password, please call the hospital operator.  07/05/2021, 10:29 AM

## 2021-07-05 NOTE — Progress Notes (Addendum)
Pharmacy Antibiotic Note  Bruce Little is a 50 y.o. male admitted on 07/01/2021 with nectrotizing left foot infection. Pharmacy has been consulted for Vancomycin and Zosyn dosing. On day 4 of therapy. S/p debridement and amputation of 5th toe on 8/25. More debridement planned on 8/29.  CKD stage V, creatinine up from baseline but trending down. Will adjust Zosyn dosing based on improved renal function. Vancomycin is currently being dosed per levels. Patient received 1.25 grams on 8/26, resulting in an approximate 24 hour concentration of 21. Vancomycin concentration is expected to fall below 20 today, so plan to dose again this evening. Will evaluate Scr and urine output on 8/27 to determine the need for further doses and vancomycin concentrations. WBC is trending down and patient remains afebrile.   Plan: Vancomycin 1000 mg IV x 1 this evening. Will follow renal function and plan to re-dose intermittently when Vanc levels are 15-20 mcg/ml. Current request is for 7 days of Vancomycin  Increase Zosyn to 3.375 gm IV q8hrs. Will follow culture data, clinical progress, antibiotic plans.   Height: '6\' 2"'$  (188 cm) Weight: 106.3 kg (234 lb 5.6 oz) IBW/kg (Calculated) : 82.2  Temp (24hrs), Avg:98.4 F (36.9 C), Min:97.6 F (36.4 C), Max:98.8 F (37.1 C)  Recent Labs  Lab 07/01/21 2055 07/02/21 0445 07/02/21 0616 07/03/21 0024 07/03/21 1240 07/04/21 0601 07/05/21 0028  WBC 26.6* 26.7*  --  20.3*  --  23.0* 18.8*  CREATININE 6.58* 6.63*  --  6.11* 6.20* 5.70* 5.47*  LATICACIDVEN 1.2 1.1 0.7  --   --   --   --   VANCORANDOM  --   --   --   --   --  12  --      Estimated Creatinine Clearance: 21.2 mL/min (A) (by C-G formula based on SCr of 5.47 mg/dL (H)).    No Known Allergies  Antimicrobials this admission: Vancomycin 8/24 >> (8/30) Zosyn 8/24 >>  Dose adjustments this admission: 8/26: vanc random level 12 mcg/ml > Vanc 1250 mg IV x 1 given 8/27: vanc random level 21 > Vanc 1g x  1 given following evening  Microbiology results: 8/24 Blood: no growth x 3 days to date 8/24 urine: negative 8/24 MRSA PCR: negative 8/24 COVID and flu: negative 8/25 left foot tissue: no organisms on gram stain, reincubated   Thank you for allowing pharmacy to participate in this patient's care.  Reatha Harps, PharmD PGY1 Pharmacy Resident 07/05/2021 12:50 PM Check [email or web address] for unit specific pharmacy number

## 2021-07-05 NOTE — Progress Notes (Signed)
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    Subjective:  No interval events Good UOP Slowly downtrending SCr  08/26 0701 - 08/27 0700 In: 782.9 [P.O.:480; I.V.:152.9; IV Piggyback:150] Out: 2400 [Urine:2400]  Filed Weights   07/01/21 1936 07/02/21 0434 07/03/21 1801  Weight: 111.1 kg 122 kg 106.3 kg    Scheduled Meds:  atorvastatin  80 mg Oral Daily   Chlorhexidine Gluconate Cloth  6 each Topical Daily   fenofibrate  160 mg Oral Daily   insulin aspart  0-6 Units Subcutaneous TID WC   insulin glargine-yfgn  10 Units Subcutaneous Daily   metoprolol tartrate  50 mg Oral BID   polyethylene glycol  17 g Oral Daily   sodium bicarbonate  650 mg Oral BID   vancomycin variable dose per unstable renal function (pharmacist dosing)   Does not apply See admin instructions   Continuous Infusions:  heparin 1,700 Units/hr (07/05/21 0134)   piperacillin-tazobactam (ZOSYN)  IV     PRN Meds:.acetaminophen **OR** acetaminophen, fentaNYL (SUBLIMAZE) injection, oxyCODONE  Current Labs: reviewed    Physical Exam:  Blood pressure (!) 160/88, pulse 84, temperature 98.6 F (37 C), temperature source Oral, resp. rate 17, height '6\' 2"'$  (1.88 m), weight 106.3 kg, SpO2 92 %. GEN: NAD ENT: NCAT EYES: EOMI CV: Regular, normal S1 and S2 PULM: Clear bilaterally, normal work of breathing ABD: Soft, nontender SKIN: No rashes or lesions EXT: No edema, deferred evaluation of left foot  A CKD 5, recent worsening in creatinine, unclear if acute or progressive.  Biopsy-proven DKD.  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  Please call with any questions or concerns.  Pt has follow up with nephrology already with Dr Johnney Ou in near future.  medication Issues; Preferred narcotic agents for pain control are hydromorphone, fentanyl, and methadone. Morphine should not be used.  Baclofen should be avoided Avoid oral sodium phosphate and magnesium citrate based laxatives / bowel preps    Pearson Grippe MD 07/05/2021, 10:46 AM  Recent Labs  Lab 07/03/21 0024 07/03/21 1240 07/04/21 0601 07/05/21 0028  NA 127* 130* 130* 129*  K 3.8 3.7 3.9 4.2  CL 100 103 102 103  CO2 17*  --  15* 16*  GLUCOSE 147* 181* 147* 179*  BUN 58* 52* 54* 57*  CREATININE 6.11* 6.20* 5.70* 5.47*  CALCIUM 8.3*  --  8.9 8.4*  PHOS  --   --  5.2*  --     Recent Labs  Lab 07/01/21 2055 07/02/21 0445 07/03/21 0024 07/03/21 1240 07/04/21 0601 07/05/21 0028  WBC 26.6* 26.7* 20.3*  --  23.0* 18.8*  NEUTROABS 25.5* 22.1*  --   --   --   --   HGB 8.4* 9.2* 7.1* 6.8* 9.9* 9.0*  HCT 25.7* 27.4* 21.0* 20.0* 28.8* 26.7*  MCV 84.8 84.3 83.3  --  83.5 84.2  PLT  411* 435* 334  --  381 385              

## 2021-07-05 NOTE — Progress Notes (Signed)
Rn notified by microbiology of positive tissue culture results. Attending notified by RN. Fuller Canada, RN

## 2021-07-05 NOTE — Plan of Care (Signed)

## 2021-07-05 NOTE — Progress Notes (Signed)
[place] for IV Heparin Indication: atrial fibrillation  No Known Allergies  Patient Measurements: Height: '6\' 2"'$  (188 cm) Weight: 106.3 kg (234 lb 5.6 oz) IBW/kg (Calculated) : 82.2 Heparin Dosing Weight: 103.8 kg  Vital Signs: Temp: 98.2 F (36.8 C) (08/26 2317) Temp Source: Oral (08/26 2317) BP: 160/90 (08/26 2317) Pulse Rate: 91 (08/26 2317)  Labs: Recent Labs    07/03/21 0024 07/03/21 1240 07/04/21 0601 07/05/21 0028  HGB 7.1* 6.8* 9.9* 9.0*  HCT 21.0* 20.0* 28.8* 26.7*  PLT 334  --  381 385  HEPARINUNFRC  --   --   --  <0.10*  CREATININE 6.11* 6.20* 5.70* 5.47*     Estimated Creatinine Clearance: 21.2 mL/min (A) (by C-G formula based on SCr of 5.47 mg/dL (H)).  Medical History: Past Medical History:  Diagnosis Date   DM2 (diabetes mellitus, type 2) ([place])    HTN (hypertension)     Assessment: 50 yr old man with CKD stage V was admitted on 07/01/21 with diabetic foot infection, S/P excisional debridement (including skin, SQ tissues, muscle, tendon, bone) and amputation of 5th L toe on 8/25 evening, with further debridement planned for 8/29. Pt developed atrial fibrillation with RVR postoperatively requiring diltiazem infusion. Cardiology has consulted pharmacy to dose IV heparin for atrial fibrillation. Pt was on no anticoagulants PTA or during this admission (was on Plavix, which is on hold for procedures).  H/H  9.9/28.8, plt 381; INR (8/23) 1.5. Per RN, no bleeding observed post procedure.  8/27 AM update:  Heparin level low  Goal of Therapy:  Heparin level 0.3-0.7 units/ml Monitor platelets by anticoagulation protocol: Yes   Plan:  Inc heparin to 1700 units/hr Re-check heparin level in 8 hours  Gillermina Hu, PharmD, BCPS, [facility] Clinical Pharmacist 07/05/2021,1:13 AM

## 2021-07-05 NOTE — Progress Notes (Signed)
ANTICOAGULATION CONSULT NOTE  Pharmacy Consult for IV heparin Indication: atrial fibrillation  No Known Allergies  Patient Measurements: Height: '6\' 2"'$  (188 cm) Weight: 106.3 kg (234 lb 5.6 oz) IBW/kg (Calculated) : 82.2 Heparin Dosing Weight: 103.8 kg  Vital Signs: Temp: 98.3 F (36.8 C) (08/27 1938) Temp Source: Oral (08/27 1938) BP: 161/89 (08/27 1938) Pulse Rate: 89 (08/27 1938)  Labs: Recent Labs    07/03/21 0024 07/03/21 1240 07/04/21 0601 07/05/21 0028 07/05/21 0946 07/05/21 1956  HGB 7.1* 6.8* 9.9* 9.0*  --   --   HCT 21.0* 20.0* 28.8* 26.7*  --   --   PLT 334  --  381 385  --   --   HEPARINUNFRC  --   --   --  <0.10* <0.10* 0.17*  CREATININE 6.11* 6.20* 5.70* 5.47*  --   --      Estimated Creatinine Clearance: 21.2 mL/min (A) (by C-G formula based on SCr of 5.47 mg/dL (H)).   Medications:  Medications Prior to Admission  Medication Sig Dispense Refill Last Dose   acetaminophen (TYLENOL) 325 MG tablet Take 2 tablets (650 mg total) by mouth every 4 (four) hours as needed for mild pain (or temp > 37.5 C (99.5 F)). 30 tablet 0 07/01/2021   amLODipine (NORVASC) 10 MG tablet Take 1 tablet (10 mg total) by mouth daily. 30 tablet 0 07/01/2021   atorvastatin (LIPITOR) 80 MG tablet Take 1 tablet (80 mg total) by mouth daily. (Patient taking differently: Take 80 mg by mouth every evening.) 30 tablet 0 07/01/2021   clopidogrel (PLAVIX) 75 MG tablet Take 1 tablet (75 mg total) by mouth daily. 30 tablet 0 07/01/2021 at 0700   fenofibrate 160 MG tablet Take 1 tablet (160 mg total) by mouth daily. (Patient taking differently: Take 160 mg by mouth every evening.) 30 tablet 0 07/01/2021   glipiZIDE (GLUCOTROL) 5 MG tablet Take 1 tablet (5 mg total) by mouth daily. 30 tablet 0 07/01/2021   hydrOXYzine (ATARAX/VISTARIL) 25 MG tablet Take 25 mg by mouth every evening.   07/01/2021   loratadine (CLARITIN) 10 MG tablet Take 10 mg by mouth daily as needed for allergies.   07/01/2021    multivitamin (ONE-A-DAY MEN'S) TABS tablet Take 1 tablet by mouth daily.   07/01/2021   pantoprazole (PROTONIX) 40 MG tablet Take 1 tablet (40 mg total) by mouth daily. 30 tablet 0 07/01/2021   VICTOZA 18 MG/3ML SOPN Inject 1.8 mg into the skin daily.   07/01/2021   zolpidem (AMBIEN) 10 MG tablet Take 5-10 mg by mouth at bedtime as needed for sleep.   Past Month   Scheduled:   atorvastatin  80 mg Oral Daily   Chlorhexidine Gluconate Cloth  6 each Topical Daily   fenofibrate  160 mg Oral Daily   insulin aspart  0-6 Units Subcutaneous TID WC   insulin glargine-yfgn  10 Units Subcutaneous Daily   metoprolol tartrate  50 mg Oral BID   polyethylene glycol  17 g Oral Daily   sodium bicarbonate  650 mg Oral BID   vancomycin variable dose per unstable renal function (pharmacist dosing)   Does not apply See admin instructions    Assessment: 50 yr old man with CKD stage V was admitted on 07/01/21 with diabetic foot infection, S/P excisional debridement (including skin, SQ tissues, muscle, tendon, bone) and amputation of 5th L toe on 8/25 evening, with further debridement planned for 8/29. Pt developed atrial fibrillation with RVR postoperatively requiring diltiazem infusion. Cardiology  has consulted pharmacy to dose IV heparin for atrial fibrillation. Pt was on no anticoagulants PTA or during this admission (was on Plavix, which is on hold for procedures).   Heparin level tonight subtherapeutic at 0.17, drawn slightly early.  Goal of Therapy:  Heparin level 0.3-0.7 units/ml Monitor platelets by anticoagulation protocol: Yes   Plan:  Increase heparin infusion to 2200 units/h Recheck heparin level in 8h  Arrie Senate, PharmD, [place], [facility] Clinical Pharmacist [phone] Please check AMION for all [place] numbers 07/05/2021

## 2021-07-05 NOTE — Progress Notes (Signed)
ANTICOAGULATION CONSULT NOTE - Follow Up Consult  Pharmacy Consult for IV heparin Indication: atrial fibrillation  No Known Allergies  Patient Measurements: Height: '6\' 2"'$  (188 cm) Weight: 106.3 kg (234 lb 5.6 oz) IBW/kg (Calculated) : 82.2 Heparin Dosing Weight: 103.8 kg  Vital Signs: Temp: 98.7 F (37.1 C) (08/27 1100) Temp Source: Oral (08/27 1100) BP: 137/83 (08/27 1100) Pulse Rate: 78 (08/27 1100)  Labs: Recent Labs    07/03/21 0024 07/03/21 1240 07/04/21 0601 07/05/21 0028 07/05/21 0946  HGB 7.1* 6.8* 9.9* 9.0*  --   HCT 21.0* 20.0* 28.8* 26.7*  --   PLT 334  --  381 385  --   HEPARINUNFRC  --   --   --  <0.10* <0.10*  CREATININE 6.11* 6.20* 5.70* 5.47*  --     Estimated Creatinine Clearance: 21.2 mL/min (A) (by C-G formula based on SCr of 5.47 mg/dL (H)).   Medications:  Medications Prior to Admission  Medication Sig Dispense Refill Last Dose   acetaminophen (TYLENOL) 325 MG tablet Take 2 tablets (650 mg total) by mouth every 4 (four) hours as needed for mild pain (or temp > 37.5 C (99.5 F)). 30 tablet 0 07/01/2021   amLODipine (NORVASC) 10 MG tablet Take 1 tablet (10 mg total) by mouth daily. 30 tablet 0 07/01/2021   atorvastatin (LIPITOR) 80 MG tablet Take 1 tablet (80 mg total) by mouth daily. (Patient taking differently: Take 80 mg by mouth every evening.) 30 tablet 0 07/01/2021   clopidogrel (PLAVIX) 75 MG tablet Take 1 tablet (75 mg total) by mouth daily. 30 tablet 0 07/01/2021 at 0700   fenofibrate 160 MG tablet Take 1 tablet (160 mg total) by mouth daily. (Patient taking differently: Take 160 mg by mouth every evening.) 30 tablet 0 07/01/2021   glipiZIDE (GLUCOTROL) 5 MG tablet Take 1 tablet (5 mg total) by mouth daily. 30 tablet 0 07/01/2021   hydrOXYzine (ATARAX/VISTARIL) 25 MG tablet Take 25 mg by mouth every evening.   07/01/2021   loratadine (CLARITIN) 10 MG tablet Take 10 mg by mouth daily as needed for allergies.   07/01/2021   multivitamin (ONE-A-DAY  MEN'S) TABS tablet Take 1 tablet by mouth daily.   07/01/2021   pantoprazole (PROTONIX) 40 MG tablet Take 1 tablet (40 mg total) by mouth daily. 30 tablet 0 07/01/2021   VICTOZA 18 MG/3ML SOPN Inject 1.8 mg into the skin daily.   07/01/2021   zolpidem (AMBIEN) 10 MG tablet Take 5-10 mg by mouth at bedtime as needed for sleep.   Past Month   Scheduled:   atorvastatin  80 mg Oral Daily   Chlorhexidine Gluconate Cloth  6 each Topical Daily   fenofibrate  160 mg Oral Daily   insulin aspart  0-6 Units Subcutaneous TID WC   insulin glargine-yfgn  10 Units Subcutaneous Daily   metoprolol tartrate  50 mg Oral BID   polyethylene glycol  17 g Oral Daily   sodium bicarbonate  650 mg Oral BID   vancomycin variable dose per unstable renal function (pharmacist dosing)   Does not apply See admin instructions    Assessment: 50 yr old man with CKD stage V was admitted on 07/01/21 with diabetic foot infection, S/P excisional debridement (including skin, SQ tissues, muscle, tendon, bone) and amputation of 5th L toe on 8/25 evening, with further debridement planned for 8/29. Pt developed atrial fibrillation with RVR postoperatively requiring diltiazem infusion. Cardiology has consulted pharmacy to dose IV heparin for atrial fibrillation. Pt was  on no anticoagulants PTA or during this admission (was on Plavix, which is on hold for procedures). Hemoglobin and hematocrit trending down, platelets are stable. Heparin level after adjustment remains subtherapeutic on 1700 units/hour. Spoke with nursing who reported no issues with the line and no stop times. Patient reported no signs or symptoms of bleeding.  Goal of Therapy:  Heparin level 0.3-0.7 units/ml Monitor platelets by anticoagulation protocol: Yes   Plan:  Increase heparin infusion to 2050 units/hr Check anti-Xa level in 8 hours and daily while on heparin Continue to monitor H&H and platelets  Thank you for allowing pharmacy to participate in this patient's  care.  Reatha Harps, PharmD PGY1 Pharmacy Resident 07/05/2021 11:59 AM Check [email or web address] for unit specific pharmacy number

## 2021-07-06 DIAGNOSIS — I480 Paroxysmal atrial fibrillation: Secondary | ICD-10-CM

## 2021-07-06 DIAGNOSIS — I10 Essential (primary) hypertension: Secondary | ICD-10-CM

## 2021-07-06 LAB — BASIC METABOLIC PANEL
Anion gap: 9 (ref 5–15)
BUN: 59 mg/dL — ABNORMAL HIGH (ref 6–20)
CO2: 17 mmol/L — ABNORMAL LOW (ref 22–32)
Calcium: 8.5 mg/dL — ABNORMAL LOW (ref 8.9–10.3)
Chloride: 103 mmol/L (ref 98–111)
Creatinine, Ser: 5.48 mg/dL — ABNORMAL HIGH (ref 0.61–1.24)
GFR, Estimated: 12 mL/min — ABNORMAL LOW (ref >60)
Glucose, Bld: 141 mg/dL — ABNORMAL HIGH (ref 70–99)
Potassium: 4 mmol/L (ref 3.5–5.1)
Sodium: 129 mmol/L — ABNORMAL LOW (ref 135–145)

## 2021-07-06 LAB — CULTURE, BLOOD (ROUTINE X 2)
Culture: NO GROWTH
Culture: NO GROWTH
Special Requests: ADEQUATE
Special Requests: ADEQUATE

## 2021-07-06 LAB — VANCOMYCIN, RANDOM: Vancomycin Rm: 25

## 2021-07-06 LAB — CBC
HCT: 27.1 % — ABNORMAL LOW (ref 39.0–52.0)
Hemoglobin: 8.9 g/dL — ABNORMAL LOW (ref 13.0–17.0)
MCH: 28.3 pg (ref 26.0–34.0)
MCHC: 32.8 g/dL (ref 30.0–36.0)
MCV: 86 fL (ref 80.0–100.0)
Platelets: 379 10*3/uL (ref 150–400)
RBC: 3.15 MIL/uL — ABNORMAL LOW (ref 4.22–5.81)
RDW: 13.3 % (ref 11.5–15.5)
WBC: 16.4 10*3/uL — ABNORMAL HIGH (ref 4.0–10.5)
nRBC: 0 % (ref 0.0–0.2)

## 2021-07-06 LAB — HEPARIN LEVEL (UNFRACTIONATED)
Heparin Unfractionated: 0.18 IU/mL — ABNORMAL LOW (ref 0.30–0.70)
Heparin Unfractionated: 0.35 IU/mL (ref 0.30–0.70)

## 2021-07-06 LAB — GLUCOSE, CAPILLARY
Glucose-Capillary: 135 mg/dL — ABNORMAL HIGH (ref 70–99)
Glucose-Capillary: 126 mg/dL — ABNORMAL HIGH (ref 70–99)
Glucose-Capillary: 157 mg/dL — ABNORMAL HIGH (ref 70–99)
Glucose-Capillary: 143 mg/dL — ABNORMAL HIGH (ref 70–99)

## 2021-07-06 NOTE — Progress Notes (Addendum)
[place] for IV heparin Indication: atrial fibrillation  No Known Allergies  Patient Measurements: Height: '6\' 2"'$  (188 cm) Weight: 106.3 kg (234 lb 5.6 oz) IBW/kg (Calculated) : 82.2 Heparin Dosing Weight: 103.8 kg  Vital Signs: Temp: 98 F (36.7 C) (08/28 0412) Temp Source: Oral (08/28 0412) BP: 150/92 (08/28 0412) Pulse Rate: 77 (08/28 0412)  Labs: Recent Labs    07/04/21 0601 07/04/21 0601 07/05/21 0028 07/05/21 0946 07/05/21 1956 07/06/21 0606  HGB 9.9*  --  9.0*  --   --  8.9*  HCT 28.8*  --  26.7*  --   --  27.1*  PLT 381  --  385  --   --  379  HEPARINUNFRC  --    < > <0.10* <0.10* 0.17* 0.18*  CREATININE 5.70*  --  5.47*  --   --  5.48*   < > = values in this interval not displayed.     Estimated Creatinine Clearance: 21.2 mL/min (A) (by C-G formula based on SCr of 5.48 mg/dL (H)).   Medications:  Medications Prior to Admission  Medication Sig Dispense Refill Last Dose   acetaminophen (TYLENOL) 325 MG tablet Take 2 tablets (650 mg total) by mouth every 4 (four) hours as needed for mild pain (or temp > 37.5 C (99.5 F)). 30 tablet 0 07/01/2021   amLODipine (NORVASC) 10 MG tablet Take 1 tablet (10 mg total) by mouth daily. 30 tablet 0 07/01/2021   atorvastatin (LIPITOR) 80 MG tablet Take 1 tablet (80 mg total) by mouth daily. (Patient taking differently: Take 80 mg by mouth every evening.) 30 tablet 0 07/01/2021   clopidogrel (PLAVIX) 75 MG tablet Take 1 tablet (75 mg total) by mouth daily. 30 tablet 0 07/01/2021 at 0700   fenofibrate 160 MG tablet Take 1 tablet (160 mg total) by mouth daily. (Patient taking differently: Take 160 mg by mouth every evening.) 30 tablet 0 07/01/2021   glipiZIDE (GLUCOTROL) 5 MG tablet Take 1 tablet (5 mg total) by mouth daily. 30 tablet 0 07/01/2021   hydrOXYzine (ATARAX/VISTARIL) 25 MG tablet Take 25 mg by mouth every evening.   07/01/2021   loratadine (CLARITIN) 10 MG tablet Take 10 mg by mouth  daily as needed for allergies.   07/01/2021   multivitamin (ONE-A-DAY MEN'S) TABS tablet Take 1 tablet by mouth daily.   07/01/2021   pantoprazole (PROTONIX) 40 MG tablet Take 1 tablet (40 mg total) by mouth daily. 30 tablet 0 07/01/2021   VICTOZA 18 MG/3ML SOPN Inject 1.8 mg into the skin daily.   07/01/2021   zolpidem (AMBIEN) 10 MG tablet Take 5-10 mg by mouth at bedtime as needed for sleep.   Past Month   Scheduled:   atorvastatin  80 mg Oral Daily   Chlorhexidine Gluconate Cloth  6 each Topical Daily   fenofibrate  160 mg Oral Daily   insulin aspart  0-6 Units Subcutaneous TID WC   insulin glargine-yfgn  10 Units Subcutaneous Daily   metoprolol tartrate  50 mg Oral BID   polyethylene glycol  17 g Oral Daily   sodium bicarbonate  650 mg Oral BID   vancomycin variable dose per unstable renal function (pharmacist dosing)   Does not apply See admin instructions    Assessment: 49 yr old man with CKD stage V was admitted on 07/01/21 with diabetic foot infection, S/P excisional debridement (including skin, SQ tissues, muscle, tendon, bone) and amputation of 5th L toe on 8/25 evening, with  further debridement planned for 8/29. Pt developed atrial fibrillation with RVR postoperatively. Cardiology has consulted pharmacy to dose IV heparin for atrial fibrillation. Pt was on no anticoagulants PTA or during this admission (was on Plavix, which is on hold for procedures).   Heparin level subtherapeutic at 0.18 after aggressive increase. H/H trending down and platelets are stable. No bleeding or issues with the line per nursing.  Goal of Therapy:  Heparin level 0.3-0.7 units/ml Monitor platelets by anticoagulation protocol: Yes   Plan:  Increase heparin infusion to 2500 units/h Recheck heparin level in 8h Monitor daily CBC and heparin level  Thank you for allowing pharmacy to participate in this patient's care.  Reatha Harps, PharmD PGY1 Pharmacy Resident 07/06/2021 7:09 AM Check [email or web address] for  unit specific pharmacy number

## 2021-07-06 NOTE — Progress Notes (Signed)
Plan is for repeat I&D of left foot tomorrow.  Will have a better idea of salvage options at that time.  Continue broad spectrum abx for now.  Cultures have been noted.  NPO after midnight.

## 2021-07-06 NOTE — Progress Notes (Signed)
PROGRESS NOTE        PATIENT DETAILS Name: Bruce Little Age: 50 y.o. Sex: male Date of Birth: Apr 04, 1971 Admit Date: 07/01/2021 Admitting Physician Rise Patience, MD [id number], [facility]  Brief Narrative: Patient is a 50 y.o. male DM-2, CKD stage V, HTN-who presented with worsening left foot ulceration-found to have necrotizing left foot infection with underlying osteomyelitis and AKI on CKD stage V.  Evaluated by orthopedics-underwent debridement and subsequent left fifth ray amputation-postoperatively-went into A. fib with RVR (briefly).  Orthopedic plans on repeat incision/debridement on 8/29.  See below for further details.  Significant events: 8/23>> admit for left foot necrotizing infection-osteomyelitis-AKI 8/25>> underwent left fifth ray amputation-developed transient A. fib postoperatively requiring Cardizem infusion  Significant studies: 8/24>> MRI left foot: Osteomyelitis fifth metatarsal head-possible osteomyelitis of the fourth metatarsal head cellulitis with air in the forefoot-concern for necrotizing infection. 8/26>> Echo: EF 55-60%.  Antimicrobial therapy: Vancomycin: 8/23>> Zosyn: 8/23>>  Microbiology data: 8/23>> blood cultures: No growth 8/24>> urine culture: No growth 8/25>> Tissue culture (intraoperative): No growth  Procedures : 8/25>> excisional debridement,left foot fifth ray amputation-application of wound VAC.   Consults: Orthopedics, nephrology, cardiology (Patwardhan)  DVT Prophylaxis : SCDs Start: 07/03/21 1540 SCDs Start: 07/02/21 0551 V heparin.   Subjective: Patient continues to feel fatigued but better than the last few days.  Denies any pain in the left foot.  Understands that he needs to undergo another irrigation and drainage procedure tomorrow.  Assessment/Plan:  Sepsis due to left foot necrotizing infection with underlying acute osteomyelitis: Sepsis physiology has resolved-leukocytosis  downtrending. Patient underwent fifth ray amputation on 8/25 along with excisional debridement Remains on empiric Vanco/Zosyn-orthopedics planning on repeat I&D on 8/29.  Surgical cultures sent on 8/25 growing multiple organisms including Pasteurella multocida, strep agalactiae, strep group G and rare staph aureus. Continue current antibiotics for now till final identification and sensitivities are available. Will also discuss with orthopedics regarding duration of antibiotic treatment since the area of concern appears to have been removed.  PAF with RVR on 8/25: Developed RVR postoperatively on 8/25-briefly required Cardizem infusion.  Maintaining sinus rhythm-currently on metoprolol.  Echo with preserved EF-however CHA2DS2-VASc score from 4-after discussion with cardiology-currently on IV heparin with plans to transition to Eliquis on discharge.  Cardiology (Dr. Virgina Jock) will arrange for outpatient follow-up in his office.  Heparin drip will need to be placed on hold early Monday morning in anticipation of repeat I&D.   AKI on CKD stage V: It appears that baseline creatinine is between 4.5 and 5.5.  Presented with a creatinine of 6.5.  AKI likely hemodynamically mediated-renal function has improved with IVF and now close to baseline.  Recent renal biopsy showed diabetic nephropathy.  No further recommendations from nephrology apart from outpatient follow-up with his primary nephrologist.  Renal function is stable.  Continue to monitor urine output.  Non anion gap metabolic acidosis: Due to CKD-has been started on oral bicarb.  Bicarbonate level is stable.  Continue to monitor.  Hyponatremia: Mild-levels fluctuating-patient is asymptomatic-watch closely for now.    Recent CVA (July 2022): No longer on Plavix-as will be on anticoagulation.   DM-2 (A1c 8.4 on 8/24): CBGs relatively stable-continue Lantus 10 units-SSI-follow and adjust.  Recent Labs    07/05/21 1557 07/05/21 2104  07/06/21 0614  GLUCAP 202* 185* 135*     Essential hypertension: BP on the higher side-has just been  switched to metoprolol due to A. fib (previously on amlodipine).  Occasional high readings noted.  Continue to monitor for now.  Leave him on current dose of metoprolol for now.    Normocytic anemia: Due to a combination of CKD stage V and acute illness.  PRBC transfusion on 8/25 while in the operating room.  Hemoglobin remained stable.  No evidence for overt blood loss.  Continue to monitor.  TSH 2.79.  Obesity: Estimated body mass index is 30.09 kg/m as calculated from the following:   Height as of this encounter: '6\' 2"'$  (1.88 m).   Weight as of this encounter: 106.3 kg.    Diet: Diet Order             Diet heart healthy/carb modified Room service appropriate? Yes; Fluid consistency: Thin  Diet effective now                    Code Status: Full code  Family Communication: None at bedside.  Disposition Plan: Status is: Inpatient  Remains inpatient appropriate because:Inpatient level of care appropriate due to severity of illness  Dispo: The patient is from: Home              Anticipated d/c is to: Home              Patient currently is not medically stable to d/c.   Difficult to place patient No   Barriers to Discharge: -scheduled to go back to the OR on Monday for repeat I&D.    Antimicrobial agents: Anti-infectives (From admission, onward)    Start     Dose/Rate Route Frequency Ordered Stop   07/05/21 1800  vancomycin (VANCOCIN) IVPB 1000 mg/200 mL premix        1,000 mg 200 mL/hr over 60 Minutes Intravenous  Once 07/05/21 1311 07/05/21 1934   07/05/21 1400  piperacillin-tazobactam (ZOSYN) IVPB 3.375 g        3.375 g 12.5 mL/hr over 240 Minutes Intravenous Every 8 hours 07/05/21 0831     07/04/21 0900  vancomycin (VANCOREADY) IVPB 1250 mg/250 mL        1,250 mg 166.7 mL/hr over 90 Minutes Intravenous  Once 07/04/21 0748 07/04/21 1101   07/03/21 0600   ceFAZolin (ANCEF) IVPB 3g/100 mL premix  Status:  Discontinued        3 g 200 mL/hr over 30 Minutes Intravenous To Short Stay 07/02/21 1610 07/03/21 1756   07/02/21 1400  piperacillin-tazobactam (ZOSYN) IVPB 2.25 g  Status:  Discontinued        2.25 g 100 mL/hr over 30 Minutes Intravenous Every 8 hours 07/02/21 0558 07/05/21 0831   07/02/21 0600  vancomycin (VANCOCIN) IVPB 1000 mg/200 mL premix  Status:  Discontinued        1,000 mg 200 mL/hr over 60 Minutes Intravenous  Once 07/02/21 0553 07/02/21 0700   07/02/21 0507  vancomycin variable dose per unstable renal function (pharmacist dosing)         Does not apply See admin instructions 07/02/21 0507     07/02/21 0500  vancomycin (VANCOREADY) IVPB 2000 mg/400 mL        2,000 mg 200 mL/hr over 120 Minutes Intravenous  Once 07/02/21 0446 07/02/21 0739   07/02/21 0500  piperacillin-tazobactam (ZOSYN) IVPB 3.375 g        3.375 g 12.5 mL/hr over 240 Minutes Intravenous Once 07/02/21 0446 07/02/21 0850         MEDICATIONS: Scheduled Meds:  atorvastatin  80  mg Oral Daily   Chlorhexidine Gluconate Cloth  6 each Topical Daily   fenofibrate  160 mg Oral Daily   insulin aspart  0-6 Units Subcutaneous TID WC   insulin glargine-yfgn  10 Units Subcutaneous Daily   metoprolol tartrate  50 mg Oral BID   polyethylene glycol  17 g Oral Daily   sodium bicarbonate  650 mg Oral BID   vancomycin variable dose per unstable renal function (pharmacist dosing)   Does not apply See admin instructions   Continuous Infusions:  heparin 2,500 Units/hr (07/06/21 0741)   piperacillin-tazobactam (ZOSYN)  IV 3.375 g (07/06/21 0628)   PRN Meds:.acetaminophen **OR** acetaminophen, fentaNYL (SUBLIMAZE) injection, oxyCODONE   PHYSICAL EXAM: Vital signs: Vitals:   07/05/21 1938 07/05/21 2336 07/06/21 0412 07/06/21 0716  BP: (!) 161/89 (!) 153/88 (!) 150/92 (!) 165/91  Pulse: 89 74 77 77  Resp: '14 20 15 17  '$ Temp: 98.3 F (36.8 C) 98.1 F (36.7 C) 98 F  (36.7 C) 97.8 F (36.6 C)  TempSrc: Oral Oral Oral Oral  SpO2: 95% 95% 94% 97%  Weight:      Height:       Filed Weights   07/01/21 1936 07/02/21 0434 07/03/21 1801  Weight: 111.1 kg 122 kg 106.3 kg   Body mass index is 30.09 kg/m.   General appearance: Awake alert.  In no distress Resp: Clear to auscultation bilaterally.  Normal effort Cardio: S1-S2 is normal regular.  No S3-S4.  No rubs murmurs or bruit GI: Abdomen is soft.  Nontender nondistended.  Bowel sounds are present normal.  No masses organomegaly Extremities: Left foot covered in dressing along with a wound VAC which is in place.  No significant erythema appreciated. Neurologic: Alert and oriented x3.  No focal neurological deficits.     I have personally reviewed following labs and imaging studies  LABORATORY DATA: CBC: Recent Labs  Lab 07/01/21 2055 07/02/21 0445 07/03/21 0024 07/03/21 1240 07/04/21 0601 07/05/21 0028 07/06/21 0606  WBC 26.6* 26.7* 20.3*  --  23.0* 18.8* 16.4*  NEUTROABS 25.5* 22.1*  --   --   --   --   --   HGB 8.4* 9.2* 7.1* 6.8* 9.9* 9.0* 8.9*  HCT 25.7* 27.4* 21.0* 20.0* 28.8* 26.7* 27.1*  MCV 84.8 84.3 83.3  --  83.5 84.2 86.0  PLT 411* 435* 334  --  381 385 379     Basic Metabolic Panel: Recent Labs  Lab 07/02/21 0445 07/03/21 0024 07/03/21 1240 07/04/21 0601 07/05/21 0028 07/06/21 0606  NA 130* 127* 130* 130* 129* 129*  K 4.1 3.8 3.7 3.9 4.2 4.0  CL 93* 100 103 102 103 103  CO2 21* 17*  --  15* 16* 17*  GLUCOSE 178* 147* 181* 147* 179* 141*  BUN 61* 58* 52* 54* 57* 59*  CREATININE 6.63* 6.11* 6.20* 5.70* 5.47* 5.48*  CALCIUM 9.9 8.3*  --  8.9 8.4* 8.5*  MG  --  1.7  --   --   --   --   PHOS  --   --   --  5.2*  --   --      GFR: Estimated Creatinine Clearance: 21.2 mL/min (A) (by C-G formula based on SCr of 5.48 mg/dL (H)).  Liver Function Tests: Recent Labs  Lab 07/01/21 2055 07/02/21 0445 07/03/21 0024 07/04/21 0601  AST 80* 94* 85*  --   ALT 72* 82*  72*  --   ALKPHOS 82 90 76  --  BILITOT 0.5 0.8 0.7  --   PROT 8.0 8.4* 6.4*  --   ALBUMIN 2.7* 2.7* 1.9* 2.0*      Coagulation Profile: Recent Labs  Lab 07/01/21 2055  INR 1.5*     Thyroid Function Tests: Recent Labs    07/05/21 0028  TSH 2.791      Urine analysis:    Component Value Date/Time   COLORURINE YELLOW 07/01/2021 2036   APPEARANCEUR HAZY (A) 07/01/2021 2036   LABSPEC 1.016 07/01/2021 2036   PHURINE 5.0 07/01/2021 2036   GLUCOSEU 150 (A) 07/01/2021 2036   HGBUR MODERATE (A) 07/01/2021 2036   BILIRUBINUR NEGATIVE 07/01/2021 2036   KETONESUR NEGATIVE 07/01/2021 2036   PROTEINUR 100 (A) 07/01/2021 2036   NITRITE NEGATIVE 07/01/2021 2036   LEUKOCYTESUR NEGATIVE 07/01/2021 2036    Sepsis Labs: Lactic Acid, Venous    Component Value Date/Time   LATICACIDVEN 0.7 07/02/2021 0616    MICROBIOLOGY: Recent Results (from the past 240 hour(s))  Blood Culture (routine x 2)     Status: None (Preliminary result)   Collection Time: 07/01/21  8:45 PM   Specimen: BLOOD  Result Value Ref Range Status   Specimen Description BLOOD RIGHT ANTECUBITAL  Final   Special Requests   Final    BOTTLES DRAWN AEROBIC AND ANAEROBIC Blood Culture adequate volume   Culture   Final    NO GROWTH 4 DAYS Performed at [facility], [place] [address] [place], [place] [address]    Report Status PENDING  Incomplete  Blood Culture (routine x 2)     Status: None (Preliminary result)   Collection Time: 07/01/21  8:55 PM   Specimen: BLOOD  Result Value Ref Range Status   Specimen Description BLOOD LEFT ANTECUBITAL  Final   Special Requests   Final    BOTTLES DRAWN AEROBIC AND ANAEROBIC Blood Culture adequate volume   Culture   Final    NO GROWTH 4 DAYS Performed at [facility], [place] [address], [place], [place] [address]    Report Status PENDING  Incomplete  Resp Panel by RT-PCR (Flu A&B, Covid) Nasopharyngeal Swab     Status: None   Collection Time: 07/02/21 12:51 AM    Specimen: Nasopharyngeal Swab; Nasopharyngeal(NP) swabs in vial transport medium  Result Value Ref Range Status   SARS Coronavirus 2 by RT PCR NEGATIVE NEGATIVE Final    Comment: (NOTE) SARS-CoV-2 target nucleic acids are NOT DETECTED.  The SARS-CoV-2 RNA is generally detectable in upper respiratory specimens during the acute phase of infection. The lowest concentration of SARS-CoV-2 viral copies this assay can detect is 138 copies/mL. A negative result does not preclude SARS-Cov-2 infection and should not be used as the sole basis for treatment or other patient management decisions. A negative result may occur with  improper specimen collection/handling, submission of specimen other than nasopharyngeal swab, presence of viral mutation(s) within the areas targeted by this assay, and inadequate number of viral copies(<138 copies/mL). A negative result must be combined with clinical observations, patient history, and epidemiological information. The expected result is Negative.  Fact Sheet for Patients:  EntrepreneurPulse.com.au  Fact Sheet for Healthcare Providers:  IncredibleEmployment.be  This test is no t yet approved or cleared by the [place] FDA and  has been authorized for detection and/or diagnosis of SARS-CoV-2 by FDA under an Emergency Use Authorization (EUA). This EUA will remain  in effect (meaning this test can be used) for the duration of the COVID-19 declaration under Section  564(b)(1) of the Act, 21 U.S.C.section 360bbb-3(b)(1), unless the authorization is terminated  or revoked sooner.       Influenza A by PCR NEGATIVE NEGATIVE Final   Influenza B by PCR NEGATIVE NEGATIVE Final    Comment: (NOTE) The Xpert Xpress SARS-CoV-2/FLU/RSV plus assay is intended as an aid in the diagnosis of influenza from Nasopharyngeal swab specimens and should not be used as a sole basis for treatment. Nasal washings and aspirates are  unacceptable for Xpert Xpress SARS-CoV-2/FLU/RSV testing.  Fact Sheet for Patients: EntrepreneurPulse.com.au  Fact Sheet for Healthcare Providers: IncredibleEmployment.be  This test is not yet approved or cleared by the [place] FDA and has been authorized for detection and/or diagnosis of SARS-CoV-2 by FDA under an Emergency Use Authorization (EUA). This EUA will remain in effect (meaning this test can be used) for the duration of the COVID-19 declaration under Section 564(b)(1) of the Act, 21 U.S.C. section 360bbb-3(b)(1), unless the authorization is terminated or revoked.  Performed at [facility], [place] [address], [place], [place] [address]   Urine Culture     Status: None   Collection Time: 07/02/21  2:55 AM   Specimen: In/Out Cath Urine  Result Value Ref Range Status   Specimen Description IN/OUT CATH URINE  Final   Special Requests NONE  Final   Culture   Final    NO GROWTH Performed at [facility], [place] [address], [place], [place] [address]    Report Status 07/03/2021 FINAL  Final  Surgical pcr screen     Status: None   Collection Time: 07/02/21  8:00 PM   Specimen: Nasal Mucosa; Nasal Swab  Result Value Ref Range Status   MRSA, PCR NEGATIVE NEGATIVE Final   Staphylococcus aureus NEGATIVE NEGATIVE Final    Comment: (NOTE) The Xpert SA Assay (FDA approved for NASAL specimens in patients 20 years of age and older), is one component of a comprehensive surveillance program. It is not intended to diagnose infection nor to guide or monitor treatment. Performed at [facility], [place] [address] [place], [place] [address]   Aerobic/Anaerobic Culture w Gram Stain (surgical/deep wound)     Status: None (Preliminary result)   Collection Time: 07/03/21  3:18 PM   Specimen: Soft Tissue, Other  Result Value Ref Range Status   Specimen Description TISSUE LEFT FOOT  Final   Special Requests NONE  Final   Gram Stain    Final    FEW SQUAMOUS EPITHELIAL CELLS PRESENT MODERATE WBC SEEN NO ORGANISMS SEEN    Culture   Final    FEW PASTEURELLA MULTOCIDA Usually susceptible to penicillin and other beta lactam agents,quinolones,macrolides and tetracyclines. RARE GROUP B STREP(S.AGALACTIAE)ISOLATED TESTING AGAINST S. AGALACTIAE NOT ROUTINELY PERFORMED DUE TO PREDICTABILITY OF AMP/PEN/VAN SUSCEPTIBILITY. RARE STREPTOCOCCUS GROUP G Beta hemolytic streptococci are predictably susceptible to penicillin and other beta lactams. Susceptibility testing not routinely performed. RARE STAPHYLOCOCCUS AUREUS CULTURE REINCUBATED FOR BETTER GROWTH CRITICAL RESULT CALLED TO, READ BACK BY AND VERIFIED WITH: T,BLACK RN '@1513'$  07/05/21 EB Performed at [facility], [address] [place],  [address]    Report Status PENDING  Incomplete    RADIOLOGY STUDIES/RESULTS: ECHOCARDIOGRAM COMPLETE  Result Date: 07/04/2021    ECHOCARDIOGRAM REPORT   Patient Name:   Bruce Little Date of Exam: 07/04/2021 Medical Rec #:  [id number]    Height:       74.0 in Accession #:    [id number]   Weight:  234.3 lb Date of Birth:  08/15/1971    BSA:          2.325 m Patient Age:    59 years     BP:           163/78 mmHg Patient Gender: M            HR:           99 bpm. Exam Location:  Inpatient Procedure: 2D Echo, Cardiac Doppler and Color Doppler Indications:    Atrial fibrilation, DM, HTN.  History:        Patient has prior history of Echocardiogram examinations, most                 recent 05/20/2021.  Sonographer:    Tawnya Crook Referring Phys: [place]  1. Left ventricular ejection fraction, by estimation, is 55 to 60%. The left ventricle has normal function. The left ventricle has no regional wall motion abnormalities. There is mild left ventricular hypertrophy. Left ventricular diastolic parameters were normal.  2. Right ventricular systolic function is normal. The right ventricular size is normal. Tricuspid  regurgitation signal is inadequate for assessing PA pressure.  3. The mitral valve is normal in structure. Mild mitral valve regurgitation. No evidence of mitral stenosis.  4. The aortic valve is grossly normal. Aortic valve regurgitation is trivial. No aortic stenosis is present.  5. The inferior vena cava is normal in size with greater than 50% respiratory variability, suggesting right atrial pressure of 3 mmHg. FINDINGS  Left Ventricle: Left ventricular ejection fraction, by estimation, is 55 to 60%. The left ventricle has normal function. The left ventricle has no regional wall motion abnormalities. The left ventricular internal cavity size was normal in size. There is  mild left ventricular hypertrophy. Left ventricular diastolic parameters were normal. Right Ventricle: The right ventricular size is normal. No increase in right ventricular wall thickness. Right ventricular systolic function is normal. Tricuspid regurgitation signal is inadequate for assessing PA pressure. Left Atrium: Left atrial size was normal in size. Right Atrium: Right atrial size was normal in size. Pericardium: Trivial pericardial effusion is present. Mitral Valve: The mitral valve is normal in structure. Mild mitral valve regurgitation. No evidence of mitral valve stenosis. Tricuspid Valve: The tricuspid valve is normal in structure. Tricuspid valve regurgitation is trivial. No evidence of tricuspid stenosis. Aortic Valve: The aortic valve is grossly normal. Aortic valve regurgitation is trivial. No aortic stenosis is present. Aortic valve mean gradient measures 5.0 mmHg. Aortic valve peak gradient measures 9.7 mmHg. Aortic valve area, by VTI measures 3.44 cm. Pulmonic Valve: The pulmonic valve was normal in structure. Pulmonic valve regurgitation is not visualized. No evidence of pulmonic stenosis. Aorta: The aortic root is normal in size and structure. Venous: The inferior vena cava is normal in size with greater than 50% respiratory  variability, suggesting right atrial pressure of 3 mmHg. IAS/Shunts: No atrial level shunt detected by color flow Doppler.  LEFT VENTRICLE PLAX 2D LVIDd:         5.60 cm     Diastology LVIDs:         3.50 cm     LV e' medial:  11.30 cm/s LV PW:         1.40 cm     LV e' lateral: 10.10 cm/s LV IVS:        1.30 cm LVOT diam:     2.40 cm LV SV:  100 LV SV Index:   43 LVOT Area:     4.52 cm  LV Volumes (MOD) LV vol d, MOD A4C: 93.0 ml LV vol s, MOD A4C: 41.7 ml LV SV MOD A4C:     93.0 ml RIGHT VENTRICLE             IVC RV S prime:     11.90 cm/s  IVC diam: 2.00 cm TAPSE (M-mode): 2.2 cm LEFT ATRIUM            Index       RIGHT ATRIUM           Index LA diam:      4.30 cm  1.85 cm/m  RA Area:     19.10 cm LA Vol (A2C): 142.0 ml 61.08 ml/m RA Volume:   52.60 ml  22.62 ml/m LA Vol (A4C): 55.3 ml  23.79 ml/m  AORTIC VALVE                    PULMONIC VALVE AV Area (Vmax):    3.42 cm     PV Vmax:       0.87 m/s AV Area (Vmean):   3.39 cm     PV Peak grad:  3.0 mmHg AV Area (VTI):     3.44 cm AV Vmax:           156.00 cm/s AV Vmean:          109.000 cm/s AV VTI:            0.289 m AV Peak Grad:      9.7 mmHg AV Mean Grad:      5.0 mmHg LVOT Vmax:         118.00 cm/s LVOT Vmean:        81.600 cm/s LVOT VTI:          0.220 m LVOT/AV VTI ratio: 0.76  AORTA Ao Root diam: 3.40 cm Ao Asc diam:  3.30 cm MR Peak grad: 91.4 mmHg MR Vmax:      478.00 cm/s  SHUNTS MV A velocity: 77.10 cm/s  Systemic VTI:  0.22 m                            Systemic Diam: 2.40 cm Cherlynn Kaiser MD Electronically signed by Cherlynn Kaiser MD Signature Date/Time: 07/04/2021/4:07:53 PM    Final      LOS: 4 days   Bonnielee Haff, MD  [facility]    To contact the attending provider between 7A-7P or the covering provider during after hours 7P-7A, please log into the web site [email or web address] and access using universal [place] password for that web site. If you do not have the password, please call the hospital  operator.  07/06/2021, 9:54 AM

## 2021-07-06 NOTE — Progress Notes (Signed)
ANTICOAGULATION CONSULT NOTE  Pharmacy Consult for IV heparin Indication: atrial fibrillation  No Known Allergies  Patient Measurements: Height: '6\' 2"'$  (188 cm) Weight: 106.3 kg (234 lb 5.6 oz) IBW/kg (Calculated) : 82.2 Heparin Dosing Weight: 103.8 kg  Vital Signs: Temp: 98.6 F (37 C) (08/28 1633) Temp Source: Oral (08/28 1633) BP: 162/89 (08/28 1633) Pulse Rate: 75 (08/28 1633)  Labs: Recent Labs    07/04/21 0601 07/05/21 0028 07/05/21 0946 07/05/21 1956 07/06/21 0606 07/06/21 1636  HGB 9.9* 9.0*  --   --  8.9*  --   HCT 28.8* 26.7*  --   --  27.1*  --   PLT 381 385  --   --  379  --   HEPARINUNFRC  --  <0.10*   < > 0.17* 0.18* 0.35  CREATININE 5.70* 5.47*  --   --  5.48*  --    < > = values in this interval not displayed.     Estimated Creatinine Clearance: 21.2 mL/min (A) (by C-G formula based on SCr of 5.48 mg/dL (H)).   Medications:  Medications Prior to Admission  Medication Sig Dispense Refill Last Dose   acetaminophen (TYLENOL) 325 MG tablet Take 2 tablets (650 mg total) by mouth every 4 (four) hours as needed for mild pain (or temp > 37.5 C (99.5 F)). 30 tablet 0 07/01/2021   amLODipine (NORVASC) 10 MG tablet Take 1 tablet (10 mg total) by mouth daily. 30 tablet 0 07/01/2021   atorvastatin (LIPITOR) 80 MG tablet Take 1 tablet (80 mg total) by mouth daily. (Patient taking differently: Take 80 mg by mouth every evening.) 30 tablet 0 07/01/2021   clopidogrel (PLAVIX) 75 MG tablet Take 1 tablet (75 mg total) by mouth daily. 30 tablet 0 07/01/2021 at 0700   fenofibrate 160 MG tablet Take 1 tablet (160 mg total) by mouth daily. (Patient taking differently: Take 160 mg by mouth every evening.) 30 tablet 0 07/01/2021   glipiZIDE (GLUCOTROL) 5 MG tablet Take 1 tablet (5 mg total) by mouth daily. 30 tablet 0 07/01/2021   hydrOXYzine (ATARAX/VISTARIL) 25 MG tablet Take 25 mg by mouth every evening.   07/01/2021   loratadine (CLARITIN) 10 MG tablet Take 10 mg by mouth daily  as needed for allergies.   07/01/2021   multivitamin (ONE-A-DAY MEN'S) TABS tablet Take 1 tablet by mouth daily.   07/01/2021   pantoprazole (PROTONIX) 40 MG tablet Take 1 tablet (40 mg total) by mouth daily. 30 tablet 0 07/01/2021   VICTOZA 18 MG/3ML SOPN Inject 1.8 mg into the skin daily.   07/01/2021   zolpidem (AMBIEN) 10 MG tablet Take 5-10 mg by mouth at bedtime as needed for sleep.   Past Month   Scheduled:   atorvastatin  80 mg Oral Daily   Chlorhexidine Gluconate Cloth  6 each Topical Daily   fenofibrate  160 mg Oral Daily   insulin aspart  0-6 Units Subcutaneous TID WC   insulin glargine-yfgn  10 Units Subcutaneous Daily   metoprolol tartrate  50 mg Oral BID   polyethylene glycol  17 g Oral Daily   sodium bicarbonate  650 mg Oral BID   vancomycin variable dose per unstable renal function (pharmacist dosing)   Does not apply See admin instructions    Assessment: 50 yr old man with CKD stage V was admitted on 07/01/21 with diabetic foot infection, S/P excisional debridement (including skin, SQ tissues, muscle, tendon, bone) and amputation of 5th L toe on 8/25 evening, with  further debridement planned for 8/29. Pt developed atrial fibrillation with RVR postoperatively. Cardiology has consulted pharmacy to dose IV heparin for atrial fibrillation. Pt was on no anticoagulants PTA or during this admission (was on Plavix, which is on hold for procedures).   Heparin level now therapeutic at 0.35 on 2500 units/h.  Goal of Therapy:  Heparin level 0.3-0.7 units/ml Monitor platelets by anticoagulation protocol: Yes   Plan:  Continue heparin 2500 units/h Daily heparin level and CBC  Arrie Senate, PharmD, [place], [facility] Clinical Pharmacist [phone] Please check AMION for all [place] numbers 07/06/2021

## 2021-07-06 NOTE — Progress Notes (Signed)
Pharmacy Antibiotic Note  Bruce Little is a 49 y.o. male admitted on 07/01/2021 with nectrotizing left foot infection. Pharmacy has been consulted for Vancomycin and Zosyn dosing. Pt is s/p debridement and amputation of 5th toe on 8/25. More debridement planned on 8/29.  Pt has CKD V, Cr has improved from admission but remains ~5.5 mg/dl. Random vancomycin level ~22h after last dose is 25 mcg/ml.    Plan: Hold vancomycin tonight - repeat VR in ~12h   Height: '6\' 2"'$  (188 cm) Weight: 106.3 kg (234 lb 5.6 oz) IBW/kg (Calculated) : 82.2  Temp (24hrs), Avg:98.2 F (36.8 C), Min:97.8 F (36.6 C), Max:98.6 F (37 C)  Recent Labs  Lab 07/01/21 2055 07/02/21 0445 07/02/21 0616 07/03/21 0024 07/03/21 1240 07/04/21 0601 07/04/21 0601 07/05/21 0028 07/05/21 0946 07/06/21 0606 07/06/21 1636  WBC 26.6* 26.7*  --  20.3*  --  23.0*  --  18.8*  --  16.4*  --   CREATININE 6.58* 6.63*  --  6.11* 6.20* 5.70*  --  5.47*  --  5.48*  --   LATICACIDVEN 1.2 1.1 0.7  --   --   --   --   --   --   --   --   VANCORANDOM  --   --   --   --   --  12   < >  --  21  --  25   < > = values in this interval not displayed.     Estimated Creatinine Clearance: 21.2 mL/min (A) (by C-G formula based on SCr of 5.48 mg/dL (H)).    No Known Allergies  Antimicrobials this admission: Vancomycin 8/24 >> (8/30) Zosyn 8/24 >>  Dose adjustments this admission: 8/26: vanc random level 12 mcg/ml > Vanc 1250 mg IV x 1 given 8/27: vanc random level 21 > Vanc 1g x 1 given following evening 8/28: vanc random 25 > hold vancomycin tonight  Microbiology results: 8/24 Blood: no growth x 3 days to date 8/24 urine: negative 8/24 MRSA PCR: negative 8/24 COVID and flu: negative 8/25 left foot tissue: no organisms on gram stain, reincubated   Thank you for allowing pharmacy to participate in this patient's care.  Arrie Senate, PharmD, BCPS, [facility] Clinical Pharmacist [phone] Please check AMION for all [place]  numbers 07/06/2021

## 2021-07-06 NOTE — Progress Notes (Signed)
Wound vac track pad replaced.  No leaks/no blockage

## 2021-07-07 ENCOUNTER — Encounter (HOSPITAL_COMMUNITY): Payer: Self-pay | Admitting: Internal Medicine

## 2021-07-07 ENCOUNTER — Inpatient Hospital Stay (HOSPITAL_COMMUNITY): Payer: BC Managed Care – PPO | Admitting: Anesthesiology

## 2021-07-07 ENCOUNTER — Encounter (HOSPITAL_COMMUNITY)
Admission: EM | Disposition: A | Discharge status: Home Health | Payer: Self-pay | Source: Home / Self Care | Attending: Internal Medicine

## 2021-07-07 DIAGNOSIS — E11628 Type 2 diabetes mellitus with other skin complications: Secondary | ICD-10-CM | POA: Diagnosis not present

## 2021-07-07 DIAGNOSIS — L089 Local infection of the skin and subcutaneous tissue, unspecified: Secondary | ICD-10-CM | POA: Diagnosis not present

## 2021-07-07 HISTORY — PX: I & D EXTREMITY: SHX5045

## 2021-07-07 HISTORY — PX: APPLICATION OF WOUND VAC: SHX5189

## 2021-07-07 LAB — AEROBIC/ANAEROBIC CULTURE W GRAM STAIN (SURGICAL/DEEP WOUND)

## 2021-07-07 LAB — CBC
HCT: 27.4 % — ABNORMAL LOW (ref 39.0–52.0)
Hemoglobin: 9.2 g/dL — ABNORMAL LOW (ref 13.0–17.0)
MCH: 28.5 pg (ref 26.0–34.0)
MCHC: 33.6 g/dL (ref 30.0–36.0)
MCV: 84.8 fL (ref 80.0–100.0)
Platelets: 404 10*3/uL — ABNORMAL HIGH (ref 150–400)
RBC: 3.23 MIL/uL — ABNORMAL LOW (ref 4.22–5.81)
RDW: 13.2 % (ref 11.5–15.5)
WBC: 14.9 10*3/uL — ABNORMAL HIGH (ref 4.0–10.5)
nRBC: 0 % (ref 0.0–0.2)

## 2021-07-07 LAB — BASIC METABOLIC PANEL
Anion gap: 9 (ref 5–15)
BUN: 66 mg/dL — ABNORMAL HIGH (ref 6–20)
CO2: 17 mmol/L — ABNORMAL LOW (ref 22–32)
Calcium: 8.5 mg/dL — ABNORMAL LOW (ref 8.9–10.3)
Chloride: 105 mmol/L (ref 98–111)
Creatinine, Ser: 5.42 mg/dL — ABNORMAL HIGH (ref 0.61–1.24)
GFR, Estimated: 12 mL/min — ABNORMAL LOW (ref >60)
Glucose, Bld: 214 mg/dL — ABNORMAL HIGH (ref 70–99)
Potassium: 4.2 mmol/L (ref 3.5–5.1)
Sodium: 131 mmol/L — ABNORMAL LOW (ref 135–145)

## 2021-07-07 LAB — GLUCOSE, CAPILLARY
Glucose-Capillary: 200 mg/dL — ABNORMAL HIGH (ref 70–99)
Glucose-Capillary: 156 mg/dL — ABNORMAL HIGH (ref 70–99)
Glucose-Capillary: 143 mg/dL — ABNORMAL HIGH (ref 70–99)
Glucose-Capillary: 162 mg/dL — ABNORMAL HIGH (ref 70–99)
Glucose-Capillary: 171 mg/dL — ABNORMAL HIGH (ref 70–99)

## 2021-07-07 LAB — HEPARIN LEVEL (UNFRACTIONATED): Heparin Unfractionated: 0.38 IU/mL (ref 0.30–0.70)

## 2021-07-07 LAB — VANCOMYCIN, RANDOM: Vancomycin Rm: 19

## 2021-07-07 SURGERY — IRRIGATION AND DEBRIDEMENT EXTREMITY
Anesthesia: General | Site: Foot | Laterality: Left

## 2021-07-07 MED ORDER — VANCOMYCIN HCL IN DEXTROSE 1-5 GM/200ML-% IV SOLN
1000.0000 mg | Freq: Once | INTRAVENOUS | Status: DC
  Filled 2021-07-07: qty 200

## 2021-07-07 MED ORDER — SODIUM CHLORIDE 0.9 % IV SOLN: INTRAVENOUS | Status: DC

## 2021-07-07 MED ORDER — AMISULPRIDE (ANTIEMETIC) 5 MG/2ML IV SOLN: 5.0000 mg | Freq: Once | INTRAVENOUS | Status: DC | PRN

## 2021-07-07 MED ORDER — SODIUM CHLORIDE 0.9 % IR SOLN
Status: DC | PRN
  Administered 2021-07-07: 1000 mL
  Administered 2021-07-07: 3000 mL

## 2021-07-07 MED ORDER — OXYCODONE HCL 5 MG/5ML PO SOLN: 5.0000 mg | Freq: Once | ORAL | Status: DC | PRN

## 2021-07-07 MED ORDER — OXYCODONE HCL 5 MG PO TABS: 5.0000 mg | ORAL_TABLET | Freq: Once | ORAL | Status: DC | PRN

## 2021-07-07 MED ORDER — LIDOCAINE 2% (20 MG/ML) 5 ML SYRINGE
INTRAMUSCULAR | Status: DC | PRN
  Administered 2021-07-07: 60 mg via INTRAVENOUS

## 2021-07-07 MED ORDER — MIDAZOLAM HCL 2 MG/2ML IJ SOLN
INTRAMUSCULAR | Status: AC
  Filled 2021-07-07: qty 2

## 2021-07-07 MED ORDER — PROPOFOL 500 MG/50ML IV EMUL
INTRAVENOUS | Status: DC | PRN
  Administered 2021-07-07: 200 mg via INTRAVENOUS

## 2021-07-07 MED ORDER — FENTANYL CITRATE (PF) 100 MCG/2ML IJ SOLN: 25.0000 ug | INTRAMUSCULAR | Status: DC | PRN

## 2021-07-07 MED ORDER — FENTANYL CITRATE (PF) 100 MCG/2ML IJ SOLN
INTRAMUSCULAR | Status: DC | PRN
  Administered 2021-07-07: 50 ug via INTRAVENOUS

## 2021-07-07 MED ORDER — VANCOMYCIN HCL IN DEXTROSE 1-5 GM/200ML-% IV SOLN
1000.0000 mg | INTRAVENOUS | Status: DC
  Filled 2021-07-07: qty 200

## 2021-07-07 MED ORDER — CEFAZOLIN SODIUM-DEXTROSE 2-3 GM-%(50ML) IV SOLR
INTRAVENOUS | Status: DC | PRN
  Administered 2021-07-07: 2 g via INTRAVENOUS

## 2021-07-07 MED ORDER — CHLORHEXIDINE GLUCONATE 0.12 % MT SOLN
OROMUCOSAL | Status: AC
  Administered 2021-07-07: 15 mL via OROMUCOSAL
  Filled 2021-07-07: qty 15

## 2021-07-07 MED ORDER — ORAL CARE MOUTH RINSE: 15.0000 mL | Freq: Once | OROMUCOSAL | Status: AC

## 2021-07-07 MED ORDER — CEFAZOLIN SODIUM 1 G IJ SOLR
INTRAMUSCULAR | Status: AC
  Filled 2021-07-07: qty 20

## 2021-07-07 MED ORDER — METOPROLOL TARTRATE 50 MG PO TABS
75.0000 mg | ORAL_TABLET | Freq: Two times a day (BID) | ORAL | Status: DC
  Administered 2021-07-07 – 2021-07-09 (×4): 75 mg via ORAL
  Filled 2021-07-07 (×4): qty 1

## 2021-07-07 MED ORDER — ACETAMINOPHEN 10 MG/ML IV SOLN: 1000.0000 mg | Freq: Once | INTRAVENOUS | Status: DC | PRN

## 2021-07-07 MED ORDER — APIXABAN 5 MG PO TABS
5.0000 mg | ORAL_TABLET | Freq: Two times a day (BID) | ORAL | Status: DC
  Administered 2021-07-08 – 2021-07-09 (×3): 5 mg via ORAL
  Filled 2021-07-07 (×3): qty 1

## 2021-07-07 MED ORDER — CEFAZOLIN SODIUM-DEXTROSE 2-4 GM/100ML-% IV SOLN
2.0000 g | Freq: Two times a day (BID) | INTRAVENOUS | Status: DC
  Filled 2021-07-07: qty 100

## 2021-07-07 MED ORDER — FENTANYL CITRATE (PF) 250 MCG/5ML IJ SOLN
INTRAMUSCULAR | Status: AC
  Filled 2021-07-07: qty 5

## 2021-07-07 MED ORDER — LIDOCAINE 2% (20 MG/ML) 5 ML SYRINGE
INTRAMUSCULAR | Status: AC
  Filled 2021-07-07: qty 5

## 2021-07-07 MED ORDER — PROMETHAZINE HCL 25 MG/ML IJ SOLN: 6.2500 mg | INTRAMUSCULAR | Status: DC | PRN

## 2021-07-07 MED ORDER — MIDAZOLAM HCL 5 MG/5ML IJ SOLN
INTRAMUSCULAR | Status: DC | PRN
  Administered 2021-07-07: 2 mg via INTRAVENOUS

## 2021-07-07 MED ORDER — CEFAZOLIN SODIUM-DEXTROSE 2-4 GM/100ML-% IV SOLN
2.0000 g | Freq: Two times a day (BID) | INTRAVENOUS | Status: DC
  Administered 2021-07-07 – 2021-07-11 (×8): 2 g via INTRAVENOUS
  Filled 2021-07-07 (×9): qty 100

## 2021-07-07 MED ORDER — CHLORHEXIDINE GLUCONATE 0.12 % MT SOLN: 15.0000 mL | Freq: Once | OROMUCOSAL | Status: AC

## 2021-07-07 SURGICAL SUPPLY — 33 items
BAG COUNTER SPONGE SURGICOUNT (BAG) ×3 IMPLANT
BLADE LONG MED 31X9 (MISCELLANEOUS) ×1 IMPLANT
BNDG COHESIVE 4X5 TAN STRL (GAUZE/BANDAGES/DRESSINGS) ×3 IMPLANT
BNDG ELASTIC 3X5.8 VLCR STR LF (GAUZE/BANDAGES/DRESSINGS) IMPLANT
BNDG GAUZE ELAST 4 BULKY (GAUZE/BANDAGES/DRESSINGS) ×3 IMPLANT
CANISTER WOUND CARE 500ML ATS (WOUND CARE) ×1 IMPLANT
COVER SURGICAL LIGHT HANDLE (MISCELLANEOUS) ×3 IMPLANT
DRAPE INCISE IOBAN 66X45 STRL (DRAPES) ×1 IMPLANT
DRAPE U-SHAPE 47X51 STRL (DRAPES) ×3 IMPLANT
DRSG PAD ABDOMINAL 8X10 ST (GAUZE/BANDAGES/DRESSINGS) ×3 IMPLANT
DRSG VAC ATS SM SENSATRAC (GAUZE/BANDAGES/DRESSINGS) ×2 IMPLANT
ELECT REM PT RETURN 9FT ADLT (ELECTROSURGICAL)
ELECTRODE REM PT RTRN 9FT ADLT (ELECTROSURGICAL) IMPLANT
GAUZE SPONGE 4X4 12PLY STRL (GAUZE/BANDAGES/DRESSINGS) ×6 IMPLANT
GLOVE SURG LTX SZ7 (GLOVE) ×3 IMPLANT
GLOVE SURG NEOP MICRO LF SZ7.5 (GLOVE) ×6 IMPLANT
GLOVE SURG UNDER POLY LF SZ7 (GLOVE) ×15 IMPLANT
GOWN STRL REIN XL XLG (GOWN DISPOSABLE) ×6 IMPLANT
KIT BASIN OR (CUSTOM PROCEDURE TRAY) ×3 IMPLANT
KIT TURNOVER KIT B (KITS) ×3 IMPLANT
MANIFOLD NEPTUNE II (INSTRUMENTS) ×3 IMPLANT
PACK ORTHO EXTREMITY (CUSTOM PROCEDURE TRAY) ×3 IMPLANT
PAD ARMBOARD 7.5X6 YLW CONV (MISCELLANEOUS) ×6 IMPLANT
PADDING CAST ABS 4INX4YD NS (CAST SUPPLIES) ×1
PADDING CAST ABS COTTON 4X4 ST (CAST SUPPLIES) ×2 IMPLANT
PADDING CAST COTTON 6X4 STRL (CAST SUPPLIES) ×3 IMPLANT
SPONGE T-LAP 18X18 ~~LOC~~+RFID (SPONGE) ×3 IMPLANT
TOWEL GREEN STERILE (TOWEL DISPOSABLE) ×3 IMPLANT
TOWEL GREEN STERILE FF (TOWEL DISPOSABLE) ×3 IMPLANT
TUBE CONNECTING 12X1/4 (SUCTIONS) ×3 IMPLANT
TUBING TUR DISP (UROLOGICAL SUPPLIES) ×1 IMPLANT
UNDERPAD 30X36 HEAVY ABSORB (UNDERPADS AND DIAPERS) ×6 IMPLANT
YANKAUER SUCT BULB TIP NO VENT (SUCTIONS) ×3 IMPLANT

## 2021-07-07 NOTE — Anesthesia Postprocedure Evaluation (Signed)
Anesthesia Post Note  Patient: Renne Crigler Buchberger  Procedure(s) Performed: IRRIGATION AND DEBRIDEMENT LEFT FOOT (Left) APPLICATION OF WOUND VAC (Left: Foot)     Patient location during evaluation: PACU Anesthesia Type: General Level of consciousness: awake Pain management: pain level controlled Vital Signs Assessment: post-procedure vital signs reviewed and stable Respiratory status: spontaneous breathing, nonlabored ventilation, respiratory function stable and patient connected to nasal cannula oxygen Cardiovascular status: blood pressure returned to baseline and stable Postop Assessment: no apparent nausea or vomiting Anesthetic complications: no   No notable events documented.  Last Vitals:  Vitals:   07/07/21 1534 07/07/21 1953  BP: (!) 162/84 (!) 149/82  Pulse: 67 77  Resp: 20 18  Temp: 36.6 C 36.7 C  SpO2: 97% 99%    Last Pain:  Vitals:   07/07/21 1953  TempSrc: Oral  PainSc:                  Karyl Kinnier Kerstin Crusoe

## 2021-07-07 NOTE — Anesthesia Preprocedure Evaluation (Signed)
Anesthesia Evaluation  Patient identified by MRN, date of birth, ID band Patient awake    Reviewed: Allergy & Precautions, NPO status , Patient's Chart, lab work & pertinent test results  Airway Mallampati: II  TM Distance: >3 FB Neck ROM: Full    Dental no notable dental hx.    Pulmonary neg pulmonary ROS,    Pulmonary exam normal breath sounds clear to auscultation       Cardiovascular hypertension, Pt. on medications Normal cardiovascular exam Rhythm:Regular Rate:Normal  ECHO: Left ventricular ejection fraction, by estimation, is 55 to 60%. The left ventricle has normal function. The left ventricle has no regional wall motion abnormalities. There is mild left ventricular hypertrophy. Left ventricular diastolic parameters were normal. Right ventricular systolic function is normal. The right ventricular size is normal. Tricuspid regurgitation signal is inadequate for assessing PA pressure. The mitral valve is normal in structure. Mild mitral valve regurgitation. No evidence of mitral stenosis. The aortic valve is grossly normal. Aortic valve regurgitation is trivial. No aortic stenosis is present. The inferior vena cava is normal in size with greater than 50% respiratory variability, suggesting right atrial pressure of 3 mmHg.   Neuro/Psych CVA negative psych ROS   GI/Hepatic negative GI ROS, Neg liver ROS,   Endo/Other  diabetes, Oral Hypoglycemic Agents  Renal/GU CRFRenal disease     Musculoskeletal negative musculoskeletal ROS (+)   Abdominal   Peds  Hematology  (+) anemia , HLD   Anesthesia Other Findings Left foot infection  Reproductive/Obstetrics                             Anesthesia Physical Anesthesia Plan  ASA: 3  Anesthesia Plan: General   Post-op Pain Management:    Induction: Intravenous  PONV Risk Score and Plan: 2 and Ondansetron, Dexamethasone and Treatment may vary  due to age or medical condition  Airway Management Planned: LMA  Additional Equipment:   Intra-op Plan:   Post-operative Plan: Extubation in OR  Informed Consent: I have reviewed the patients History and Physical, chart, labs and discussed the procedure including the risks, benefits and alternatives for the proposed anesthesia with the patient or authorized representative who has indicated his/her understanding and acceptance.     Dental advisory given  Plan Discussed with: CRNA  Anesthesia Plan Comments:         Anesthesia Quick Evaluation

## 2021-07-07 NOTE — Transfer of Care (Signed)
Immediate Anesthesia Transfer of Care Note  Patient: Bruce Little  Procedure(s) Performed: IRRIGATION AND DEBRIDEMENT LEFT FOOT (Left) APPLICATION OF WOUND VAC (Left: Foot)  Patient Location: PACU  Anesthesia Type:General  Level of Consciousness: drowsy, patient cooperative and responds to stimulation  Airway & Oxygen Therapy: Patient Spontanous Breathing and Patient connected to face mask oxygen  Post-op Assessment: Report given to RN, Post -op Vital signs reviewed and stable and Patient moving all extremities X 4  Post vital signs: Reviewed and stable  Last Vitals:  Vitals Value Taken Time  BP 131/74 07/07/21 1407  Temp    Pulse 63 07/07/21 1410  Resp 12 07/07/21 1410  SpO2 100 % 07/07/21 1410  Vitals shown include unvalidated device data.  Last Pain:  Vitals:   07/07/21 1224  TempSrc:   PainSc: 0-No pain      Patients Stated Pain Goal: 2 ([id number])  Complications: No notable events documented.

## 2021-07-07 NOTE — Op Note (Signed)
   Date of Surgery: 07/07/2021  INDICATIONS: Mr. Silverstone is a 50 y.o.-year-old male with a left diabetic foot infection status post left 5th ray amputation.  The Patient did consent to the procedure after discussion of the risks and benefits.  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  PROCEDURE:  Revision amputation of left 5th metatarsal Excisional debridement of left foot wound including skin, muscle, bone 10 x 10 cm Application of wound VAC greater than 50 cm  Debridement type: Excisional Debridement  Side: left  Body Location: foot   SURGEON: N. Eduard Roux, M.D.  ASSIST: Ciro Backer [place], [place]; necessary for the timely completion of procedure and due to complexity of procedure.  ANESTHESIA:  general  IV FLUIDS AND URINE: See anesthesia.  ESTIMATED BLOOD LOSS: minimal mL.  IMPLANTS: none  DRAINS: wound vac  COMPLICATIONS: see description of procedure.  DESCRIPTION OF PROCEDURE: The patient was brought to the operating room.  The patient had been signed prior to the procedure and this was documented. The patient had the anesthesia placed by the anesthesiologist.  A time-out was performed to confirm that this was the correct patient, site, side and location. The patient did receive antibiotics prior to the incision and was re-dosed during the procedure as needed at indicated intervals.  The patient had the operative extremity prepped and draped in the standard surgical fashion.    We first inspected the surgical wound bed which showed for the most part excellent beefy red granulation tissue with punctate bleeding.  He did have some small areas towards the skin edge near the fourth toe that did not appear viable therefore this was debrided.  The subcutaneous tissue deep to the fourth toe was also debrided.  Gentle debridement was continued throughout the surgical wound with a rondure back to  bleeding tissue including skin, subcutaneous tissue, muscle and the fifth metatarsal.  Because the end of the fifth metatarsal was prominent in the wound bed I performed revision fifth ray amputation with an oscillating saw about a centimeter proximal to the previous osteotomy site in order to increase chances of forming granulation tissue in the wound bed.  After thorough debridement 3 L of normal saline was irrigated throughout the wound.  A wound VAC was then placed.  Patient tolerated procedure well had no many complications.  Tawanna Cooler was necessary for opening, closing, retracting, limb positioning and overall facilitation and timely completion of the procedure.  POSTOPERATIVE PLAN: Patient will need to begin wound VAC changes every Monday Wednesday Friday and the plan is to allow the wound to heal by secondary intention.  The patient will need to remain on IV antibiotics for another 48 hours and then can be transitioned to p.o. antibiotics for another month.  Azucena Cecil, MD 2:19 PM

## 2021-07-07 NOTE — Anesthesia Procedure Notes (Signed)
Procedure Name: LMA Insertion Date/Time: 07/07/2021 1:26 PM Performed by: Cathren Harsh, CRNA Pre-anesthesia Checklist: Patient identified, Emergency Drugs available, Suction available and Patient being monitored Patient Re-evaluated:Patient Re-evaluated prior to induction Oxygen Delivery Method: Circle System Utilized Preoxygenation: Pre-oxygenation with 100% oxygen Induction Type: IV induction LMA: LMA inserted LMA Size: 5.0 Number of attempts: 1 Airway Equipment and Method: Bite block Placement Confirmation: positive ETCO2 Tube secured with: Tape Dental Injury: Teeth and Oropharynx as per pre-operative assessment

## 2021-07-07 NOTE — H&P (Signed)

## 2021-07-07 NOTE — Progress Notes (Addendum)
Pharmacy Antibiotic Note  Bruce Little is a 50 y.o. male admitted on 07/01/2021 with nectrotizing left foot infection. Pharmacy has been consulted for Vancomycin and Zosyn dosing. Pt is s/p debridement and amputation of 5th toe on 8/25. More debridement planned on 8/29.  Pt has CKD V, Cr has improved from admission but  remains elevated, at 5.42 today.  Vancomycin 1000 mg IV dose was last given on 8/27 at 18:34.  This morning 8/29 the random vancomycin level  is 19 mcg/ml about 36 hour after his most recent vancomycin dose on 8/27.  I estimate that level will be ~15 mcg/ml or less around  18:30 tonight , which is ~48 hours after the last vancomycin dose was given.   S/p debridement of left foot,  revision amputation of left 5th metatarsal and applicatio of wound VAC done today 8/29.  Ortho noted wants to continue IV antibiotics x 48 hours then to transition to PO antibiotics for another month,   Plan: Give Vancomycin 1000 mg IV today at 18:30 continue 1000 mg IV every 48 hours thru 8/31. Then ortho plans to transition to PO antibiotics for another month, per Dr. Phoebe Sharps note.  Continue Zosyn 3.375 g IV q8h (4 hr infusion rate).  Monitor clinical status, renal function and culture results daily.   Vancomycin levels per protocol if continued.  ADDENDUM :   Tissue culture + MSSA I have discussed antibiotics and culture results with Dr. [place] Pink to consider narrowing antibiotic.  He said to discontinue Vancomycin and Zosyn then start Cefazolin and continue x 48 hours.  Then ortho has noted plan to transition to oral antibiotic fpr another month.   Height: '6\' 2"'$  (188 cm) Weight: 106.3 kg (234 lb 5.6 oz) IBW/kg (Calculated) : 82.2  Temp (24hrs), Avg:98.2 F (36.8 C), Min:97.6 F (36.4 C), Max:98.6 F (37 C)  Recent Labs  Lab 07/01/21 2055 07/02/21 0445 07/02/21 0616 07/03/21 0024 07/03/21 1240 07/04/21 0601 07/05/21 0028 07/05/21 0946 07/06/21 0606 07/06/21 1636 07/07/21 0632  WBC  26.6* 26.7*  --  20.3*  --  23.0* 18.8*  --  16.4*  --  14.9*  CREATININE 6.58* 6.63*  --  6.11* 6.20* 5.70* 5.47*  --  5.48*  --  5.42*  LATICACIDVEN 1.2 1.1 0.7  --   --   --   --   --   --   --   --   VANCORANDOM  --   --   --   --   --  12  --    < >  --  25 19   < > = values in this interval not displayed.     Estimated Creatinine Clearance: 21.4 mL/min (A) (by C-G formula based on SCr of 5.42 mg/dL (H)).    No Known Allergies  Antimicrobials this admission: Vancomycin 8/24 >> 8/29 Zosyn 8/24 >>8/29 Cefazolin 8/29>8/31  Dose adjustments this admission: 8/26: vanc random level 12 mcg/ml > Vanc 1250 mg IV x 1 given 8/27: vanc random level 21 > Vanc 1g x 1 given following evening 8/28: vanc random 25 > hold vancomycin tonight 8/29 VR 19 (0630 AM),  give vanc 1g IV q48 hr starting at 18:30 on 8/29 x 2 doses (thru 8/31). > DC'd vanc> changed to cefazolin  Microbiology results: 8/24 Blood: negative  8/24 urine: negative 8/24 MRSA PCR: negative 8/24 COVID and flu: negative 8/25 left foot tissue: no organisms on gram stain, reincubated> MSSA   Thank you for allowing pharmacy to  participate in this patient's care.  Arrie Senate, PharmD, BCPS, [facility] Clinical Pharmacist [phone] Please check AMION for all [place] numbers 07/07/2021

## 2021-07-07 NOTE — Progress Notes (Signed)
PT received from PACU, VSS, Wound vac clean dry and intact, orders released, will continue to monitor.   Chrisandra Carota, RN 07/07/2021 3:38 PM

## 2021-07-07 NOTE — Consult Note (Signed)
[facility] Nurse Consult Note:  [facility] team requested to change left foot Vac on Wed.  Pt is currently in the perioperative setting and had surgery today with the ortho service. Our team will change the dressing beginning on Wed as requested. Julien Girt MSN, RN, [place], [place], [place]

## 2021-07-07 NOTE — Discharge Instructions (Addendum)
Left foot Non-weight bearing.  Continue wound vac at all times.  Follow up with Dr. Erlinda Hong 2 weeks after second surgery   Information on my medicine - ELIQUIS (apixaban)  Why was Eliquis prescribed for you? Eliquis was prescribed for you to reduce the risk of a blood clot forming that can cause a stroke if you have a medical condition called atrial fibrillation (a type of irregular heartbeat).  What do You need to know about Eliquis ? Take your Eliquis TWICE DAILY - one tablet in the morning and one tablet in the evening with or without food. If you have difficulty swallowing the tablet whole please discuss with your pharmacist how to take the medication safely.  Take Eliquis exactly as prescribed by your doctor and DO NOT stop taking Eliquis without talking to the doctor who prescribed the medication.  Stopping may increase your risk of developing a stroke.  Refill your prescription before you run out.  After discharge, you should have regular check-up appointments with your healthcare provider that is prescribing your Eliquis.  In the future your dose may need to be changed if your kidney function or weight changes by a significant amount or as you get older.  What do you do if you miss a dose? If you miss a dose, take it as soon as you remember on the same day and resume taking twice daily.  Do not take more than one dose of ELIQUIS at the same time to make up a missed dose.  Important Safety Information   Follow with Primary MD Physicians, [place] [facility] in 7 days   Get CBC, CMP, 2 view Chest X ray -  checked next visit within 1 week by Primary MD   Activity: No weightbearing on the left foot, you can touch your L. foot heel with the assistance of a walker if needed but no weight on L. Foot toes,  Full fall precautions use walker/cane & assistance as needed.  Disposition Home    Diet: Heart Healthy Low Carb, check CBGs QAC-HS  Special Instructions: If you have smoked or  chewed Tobacco  in the last 2 yrs please stop smoking, stop any regular Alcohol  and or any Recreational drug use.  On your next visit with your primary care physician please Get Medicines reviewed and adjusted.  Please request your Prim.MD to go over all Hospital Tests and Procedure/Radiological results at the follow up, please get all Hospital records sent to your Prim MD by signing hospital release before you go home.  If you experience worsening of your admission symptoms, develop shortness of breath, life threatening emergency, suicidal or homicidal thoughts you must seek medical attention immediately by calling 911 or calling your MD immediately  if symptoms less severe.  You Must read complete instructions/literature along with all the possible adverse reactions/side effects for all the Medicines you take and that have been prescribed to you. Take any new Medicines after you have completely understood and accpet all the possible adverse reactions/side effects.        A possible side effect of Eliquis is bleeding. You should call your healthcare provider right away if you experience any of the following: Bleeding from an injury or your nose that does not stop. Unusual colored urine (red or dark brown) or unusual colored stools (red or black). Unusual bruising for unknown reasons. A serious fall or if you hit your head (even if there is no bleeding).  Some medicines may interact with Eliquis  and might increase your risk of bleeding or clotting while on Eliquis. To help avoid this, consult your healthcare provider or pharmacist prior to using any new prescription or non-prescription medications, including herbals, vitamins, non-steroidal anti-inflammatory drugs (NSAIDs) and supplements.  This website has more information on Eliquis (apixaban): http://www.eliquis.com/eliquis/home

## 2021-07-07 NOTE — Progress Notes (Addendum)
PROGRESS NOTE        PATIENT DETAILS Name: Bruce Little Age: 50 y.o. Sex: male Date of Birth: [birth year] Admit Date: 07/01/2021 Admitting Physician Rise Patience, MD [id number], [facility]  Brief Narrative: Patient is a 50 y.o. male DM-2, CKD stage V, HTN-who presented with worsening left foot ulceration-found to have necrotizing left foot infection with underlying osteomyelitis and AKI on CKD stage V.  Evaluated by orthopedics-underwent debridement and subsequent left fifth ray amputation-postoperatively-went into A. fib with RVR (briefly).  Orthopedic plans on repeat incision/debridement on 8/29.  See below for further details.  Significant events: 8/23>> admit for left foot necrotizing infection-osteomyelitis-AKI 8/25>> underwent left fifth ray amputation-developed transient A. fib postoperatively requiring Cardizem infusion  Significant studies: 8/24>> MRI left foot: Osteomyelitis fifth metatarsal head-possible osteomyelitis of the fourth metatarsal head cellulitis with air in the forefoot-concern for necrotizing infection. 8/26>> Echo: EF 55-60%.  Antimicrobial therapy: Vancomycin: 8/23>> Zosyn: 8/23>>  Microbiology data: 8/23>> blood cultures: No growth 8/24>> urine culture: No growth 8/25>> Tissue culture (intraoperative): No growth  Procedures : 8/25>> excisional debridement,left foot fifth ray amputation-application of wound VAC.   Consults: Orthopedics, nephrology, cardiology (Patwardhan)  DVT Prophylaxis : SCDs Start: 07/03/21 1540 SCDs Start: 07/02/21 0551 V heparin.   Subjective: Patient denies any pain in his left foot.  Apparently plan is for irrigation and drainage at noon.  Heparin was discontinued this morning.  Patient also denies any shortness of breath or chest pain.  Assessment/Plan:  Sepsis due to left foot necrotizing infection with underlying acute osteomyelitis:  Sepsis physiology has  resolved-leukocytosis downtrending. Patient underwent fifth ray amputation on 8/25 along with excisional debridement.  Wound VAC has been placed as well. Remains on empiric Vanco/Zosyn  Surgical cultures sent on 8/25 growing multiple organisms including Pasteurella multocida, strep agalactiae, strep group G and rare staph aureus. Final report reviewed and shows only MSSA. Will change to Cefazolin for 48 hrs as recommended by Ortho and then change to Oral antibiotics for 1 month. Plan is for repeat I&D today.  Heparin discontinued this morning.  Procedure planned for 245 this afternoon.  PAF with RVR on 8/25:  Developed RVR postoperatively on 8/25-briefly required Cardizem infusion.  Maintaining sinus rhythm-currently on metoprolol.  Echo with preserved EF-however CHA2DS2-VASc score from 4-after discussion with cardiology-currently on IV heparin with plans to transition to Eliquis on discharge.  Cardiology (Dr. Virgina Jock) will arrange for outpatient follow-up in his office.   Heparin was placed on hold for procedure. Ortho ok with restarting anticoagulation 12hrs after procedure. No further surgery is planned. Will initiate Eliquis from AM. Discussed with Pharmacy.  AKI on CKD stage V: It appears that baseline creatinine is between 4.5 and 5.5.  Presented with a creatinine of 6.5.  AKI likely hemodynamically mediated-renal function has improved with IVF and now close to baseline.   Recent renal biopsy showed diabetic nephropathy.  No further recommendations from nephrology apart from outpatient follow-up with his primary nephrologist.  Renal function remained stable.  Monitor urine output.  Non anion gap metabolic acidosis: Due to CKD-has been started on oral bicarb.  Bicarbonate level is stable.  Continue to monitor.  Hyponatremia: Levels are stable for the most part.  Some fluctuation is noted.  Recent CVA (July 2022): No longer on Plavix-as he will be on anticoagulation.   DM-2 (A1c 8.4 on  8/24): CBGs relatively stable-continue Lantus 10  units-SSI-follow and adjust.  Recent Labs    07/06/21 1643 07/06/21 2052 07/07/21 0628  GLUCAP 157* 143* 200*     Essential hypertension: Previously on amlodipine.  Due to atrial fibrillation he was switched over to metoprolol.  Blood pressure remains somewhat elevated.  No significant pain issues noted.  Could go up on the dose of metoprolol.    Normocytic anemia: Due to a combination of CKD stage V and acute illness.  PRBC transfusion on 8/25 while in the operating room.   Hemoglobin remains stable.  No evidence of overt blood loss.   TSH 2.79.  Obesity: Estimated body mass index is 30.09 kg/m as calculated from the following:   Height as of this encounter: '6\' 2"'$  (1.88 m).   Weight as of this encounter: 106.3 kg.    Diet: Diet Order             Diet NPO time specified  Diet effective midnight                    Code Status: Full code  Family Communication: None at bedside.  Disposition Plan: Status is: Inpatient  Remains inpatient appropriate because:Inpatient level of care appropriate due to severity of illness  Dispo: The patient is from: Home              Anticipated d/c is to: Home              Patient currently is not medically stable to d/c.   Difficult to place patient No   Barriers to Discharge: -scheduled to go back to the OR on Monday for repeat I&D.  PT and OT to determine how much assistance he is going to need at discharge.  Antimicrobial agents: Anti-infectives (From admission, onward)    Start     Dose/Rate Route Frequency Ordered Stop   07/05/21 1800  vancomycin (VANCOCIN) IVPB 1000 mg/200 mL premix        1,000 mg 200 mL/hr over 60 Minutes Intravenous  Once 07/05/21 1311 07/05/21 1934   07/05/21 1400  piperacillin-tazobactam (ZOSYN) IVPB 3.375 g        3.375 g 12.5 mL/hr over 240 Minutes Intravenous Every 8 hours 07/05/21 0831     07/04/21 0900  vancomycin (VANCOREADY) IVPB 1250 mg/250  mL        1,250 mg 166.7 mL/hr over 90 Minutes Intravenous  Once 07/04/21 0748 07/04/21 1101   07/03/21 0600  ceFAZolin (ANCEF) IVPB 3g/100 mL premix  Status:  Discontinued        3 g 200 mL/hr over 30 Minutes Intravenous To Short Stay 07/02/21 1610 07/03/21 1756   07/02/21 1400  piperacillin-tazobactam (ZOSYN) IVPB 2.25 g  Status:  Discontinued        2.25 g 100 mL/hr over 30 Minutes Intravenous Every 8 hours 07/02/21 0558 07/05/21 0831   07/02/21 0600  vancomycin (VANCOCIN) IVPB 1000 mg/200 mL premix  Status:  Discontinued        1,000 mg 200 mL/hr over 60 Minutes Intravenous  Once 07/02/21 0553 07/02/21 0700   07/02/21 0507  vancomycin variable dose per unstable renal function (pharmacist dosing)         Does not apply See admin instructions 07/02/21 0507     07/02/21 0500  vancomycin (VANCOREADY) IVPB 2000 mg/400 mL        2,000 mg 200 mL/hr over 120 Minutes Intravenous  Once 07/02/21 0446 07/02/21 0739   07/02/21 0500  piperacillin-tazobactam (ZOSYN) IVPB 3.375 g  3.375 g 12.5 mL/hr over 240 Minutes Intravenous Once 07/02/21 0446 07/02/21 0850         MEDICATIONS: Scheduled Meds:  atorvastatin  80 mg Oral Daily   Chlorhexidine Gluconate Cloth  6 each Topical Daily   fenofibrate  160 mg Oral Daily   insulin aspart  0-6 Units Subcutaneous TID WC   insulin glargine-yfgn  10 Units Subcutaneous Daily   metoprolol tartrate  50 mg Oral BID   polyethylene glycol  17 g Oral Daily   sodium bicarbonate  650 mg Oral BID   vancomycin variable dose per unstable renal function (pharmacist dosing)   Does not apply See admin instructions   Continuous Infusions:  piperacillin-tazobactam (ZOSYN)  IV 12.5 mL/hr at 07/07/21 0643   PRN Meds:.acetaminophen **OR** acetaminophen, fentaNYL (SUBLIMAZE) injection, oxyCODONE   PHYSICAL EXAM: Vital signs: Vitals:   07/06/21 2036 07/06/21 2352 07/07/21 0359 07/07/21 0739  BP: (!) 168/89 (!) 162/87 (!) 159/86 (!) 153/85  Pulse: 81 79 77  74  Resp: '20 14 15 16  '$ Temp: 97.9 F (36.6 C) 98.1 F (36.7 C) 98.3 F (36.8 C) 98.4 F (36.9 C)  TempSrc: Oral Oral Oral Oral  SpO2: 94% 94% 95% 97%  Weight:      Height:       Filed Weights   07/01/21 1936 07/02/21 0434 07/03/21 1801  Weight: 111.1 kg 122 kg 106.3 kg   Body mass index is 30.09 kg/m.    General appearance: Awake alert.  In no distress Resp: Clear to auscultation bilaterally.  Normal effort Cardio: S1-S2 is normal regular.  No S3-S4.  No rubs murmurs or bruit GI: Abdomen is soft.  Nontender nondistended.  Bowel sounds are present normal.  No masses organomegaly Extremities: Left foot covered in dressing along with a wound VAC.  No significant erythema noted. Neurologic: Alert and oriented x3.  No focal neurological deficits.     I have personally reviewed following labs and imaging studies  LABORATORY DATA: CBC: Recent Labs  Lab 07/01/21 2055 07/02/21 0445 07/03/21 0024 07/03/21 1240 07/04/21 0601 07/05/21 0028 07/06/21 0606 07/07/21 0632  WBC 26.6* 26.7* 20.3*  --  23.0* 18.8* 16.4* 14.9*  NEUTROABS 25.5* 22.1*  --   --   --   --   --   --   HGB 8.4* 9.2* 7.1* 6.8* 9.9* 9.0* 8.9* 9.2*  HCT 25.7* 27.4* 21.0* 20.0* 28.8* 26.7* 27.1* 27.4*  MCV 84.8 84.3 83.3  --  83.5 84.2 86.0 84.8  PLT 411* 435* 334  --  381 385 379 404*     Basic Metabolic Panel: Recent Labs  Lab 07/03/21 0024 07/03/21 1240 07/04/21 0601 07/05/21 0028 07/06/21 0606 07/07/21 0632  NA 127* 130* 130* 129* 129* 131*  K 3.8 3.7 3.9 4.2 4.0 4.2  CL 100 103 102 103 103 105  CO2 17*  --  15* 16* 17* 17*  GLUCOSE 147* 181* 147* 179* 141* 214*  BUN 58* 52* 54* 57* 59* 66*  CREATININE 6.11* 6.20* 5.70* 5.47* 5.48* 5.42*  CALCIUM 8.3*  --  8.9 8.4* 8.5* 8.5*  MG 1.7  --   --   --   --   --   PHOS  --   --  5.2*  --   --   --      GFR: Estimated Creatinine Clearance: 21.4 mL/min (A) (by C-G formula based on SCr of 5.42 mg/dL (H)).  Liver Function Tests: Recent Labs   Lab 07/01/21 2055 07/02/21 0445  07/03/21 0024 07/04/21 0601  AST 80* 94* 85*  --   ALT 72* 82* 72*  --   ALKPHOS 82 90 76  --   BILITOT 0.5 0.8 0.7  --   PROT 8.0 8.4* 6.4*  --   ALBUMIN 2.7* 2.7* 1.9* 2.0*      Coagulation Profile: Recent Labs  Lab 07/01/21 2055  INR 1.5*     Thyroid Function Tests: Recent Labs    07/05/21 0028  TSH 2.791      Urine analysis:    Component Value Date/Time   COLORURINE YELLOW 07/01/2021 2036   APPEARANCEUR HAZY (A) 07/01/2021 2036   LABSPEC 1.016 07/01/2021 2036   PHURINE 5.0 07/01/2021 2036   GLUCOSEU 150 (A) 07/01/2021 2036   HGBUR MODERATE (A) 07/01/2021 2036   BILIRUBINUR NEGATIVE 07/01/2021 2036   KETONESUR NEGATIVE 07/01/2021 2036   PROTEINUR 100 (A) 07/01/2021 2036   NITRITE NEGATIVE 07/01/2021 2036   LEUKOCYTESUR NEGATIVE 07/01/2021 2036    Sepsis Labs: Lactic Acid, Venous    Component Value Date/Time   LATICACIDVEN 0.7 07/02/2021 0616    MICROBIOLOGY: Recent Results (from the past 240 hour(s))  Blood Culture (routine x 2)     Status: None   Collection Time: 07/01/21  8:45 PM   Specimen: BLOOD  Result Value Ref Range Status   Specimen Description BLOOD RIGHT ANTECUBITAL  Final   Special Requests   Final    BOTTLES DRAWN AEROBIC AND ANAEROBIC Blood Culture adequate volume   Culture   Final    NO GROWTH 5 DAYS Performed at [facility], [place] [address], [place], [place] [address]    Report Status 07/06/2021 FINAL  Final  Blood Culture (routine x 2)     Status: None   Collection Time: 07/01/21  8:55 PM   Specimen: BLOOD  Result Value Ref Range Status   Specimen Description BLOOD LEFT ANTECUBITAL  Final   Special Requests   Final    BOTTLES DRAWN AEROBIC AND ANAEROBIC Blood Culture adequate volume   Culture   Final    NO GROWTH 5 DAYS Performed at [facility], [place] [address] [place], [place] [address]    Report Status 07/06/2021 FINAL  Final  Resp Panel by RT-PCR (Flu A&B, Covid)  Nasopharyngeal Swab     Status: None   Collection Time: 07/02/21 12:51 AM   Specimen: Nasopharyngeal Swab; Nasopharyngeal(NP) swabs in vial transport medium  Result Value Ref Range Status   SARS Coronavirus 2 by RT PCR NEGATIVE NEGATIVE Final    Comment: (NOTE) SARS-CoV-2 target nucleic acids are NOT DETECTED.  The SARS-CoV-2 RNA is generally detectable in upper respiratory specimens during the acute phase of infection. The lowest concentration of SARS-CoV-2 viral copies this assay can detect is 138 copies/mL. A negative result does not preclude SARS-Cov-2 infection and should not be used as the sole basis for treatment or other patient management decisions. A negative result may occur with  improper specimen collection/handling, submission of specimen other than nasopharyngeal swab, presence of viral mutation(s) within the areas targeted by this assay, and inadequate number of viral copies(<138 copies/mL). A negative result must be combined with clinical observations, patient history, and epidemiological information. The expected result is Negative.  Fact Sheet for Patients:  EntrepreneurPulse.com.au  Fact Sheet for Healthcare Providers:  IncredibleEmployment.be  This test is no t yet approved or cleared by the [place] FDA and  has been authorized for detection and/or diagnosis of SARS-CoV-2 by FDA under  an Emergency Use Authorization (EUA). This EUA will remain  in effect (meaning this test can be used) for the duration of the COVID-19 declaration under Section 564(b)(1) of the Act, 21 U.S.C.section 360bbb-3(b)(1), unless the authorization is terminated  or revoked sooner.       Influenza A by PCR NEGATIVE NEGATIVE Final   Influenza B by PCR NEGATIVE NEGATIVE Final    Comment: (NOTE) The Xpert Xpress SARS-CoV-2/FLU/RSV plus assay is intended as an aid in the diagnosis of influenza from Nasopharyngeal swab specimens and should not be  used as a sole basis for treatment. Nasal washings and aspirates are unacceptable for Xpert Xpress SARS-CoV-2/FLU/RSV testing.  Fact Sheet for Patients: EntrepreneurPulse.com.au  Fact Sheet for Healthcare Providers: IncredibleEmployment.be  This test is not yet approved or cleared by the [place] FDA and has been authorized for detection and/or diagnosis of SARS-CoV-2 by FDA under an Emergency Use Authorization (EUA). This EUA will remain in effect (meaning this test can be used) for the duration of the COVID-19 declaration under Section 564(b)(1) of the Act, 21 U.S.C. section 360bbb-3(b)(1), unless the authorization is terminated or revoked.  Performed at [facility], [place] [address] [place], [place] [address]   Urine Culture     Status: None   Collection Time: 07/02/21  2:55 AM   Specimen: In/Out Cath Urine  Result Value Ref Range Status   Specimen Description IN/OUT CATH URINE  Final   Special Requests NONE  Final   Culture   Final    NO GROWTH Performed at [facility], [place] [address], [place], [place] [address]    Report Status 07/03/2021 FINAL  Final  Surgical pcr screen     Status: None   Collection Time: 07/02/21  8:00 PM   Specimen: Nasal Mucosa; Nasal Swab  Result Value Ref Range Status   MRSA, PCR NEGATIVE NEGATIVE Final   Staphylococcus aureus NEGATIVE NEGATIVE Final    Comment: (NOTE) The Xpert SA Assay (FDA approved for NASAL specimens in patients 68 years of age and older), is one component of a comprehensive surveillance program. It is not intended to diagnose infection nor to guide or monitor treatment. Performed at [facility], [place] [address] [place], [place] [address]   Aerobic/Anaerobic Culture w Gram Stain (surgical/deep wound)     Status: None (Preliminary result)   Collection Time: 07/03/21  3:18 PM   Specimen: Soft Tissue, Other  Result Value Ref Range Status   Specimen Description  TISSUE LEFT FOOT  Final   Special Requests NONE  Final   Gram Stain   Final    FEW SQUAMOUS EPITHELIAL CELLS PRESENT MODERATE WBC SEEN NO ORGANISMS SEEN    Culture   Final    FEW PASTEURELLA MULTOCIDA Usually susceptible to penicillin and other beta lactam agents,quinolones,macrolides and tetracyclines. RARE GROUP B STREP(S.AGALACTIAE)ISOLATED RARE STREPTOCOCCUS GROUP G Beta hemolytic streptococci are predictably susceptible to penicillin and other beta lactams. Susceptibility testing not routinely performed. RARE STAPHYLOCOCCUS AUREUS HOLDING FOR POSSIBLE ANAEROBE SUSCEPTIBILITIES TO FOLLOW CRITICAL RESULT CALLED TO, READ BACK BY AND VERIFIED WITH: T,BLACK RN '@[id number]'$  07/05/21 EB Performed at [facility], [address] [place], [place] [address]    Report Status PENDING  Incomplete    RADIOLOGY STUDIES/RESULTS: No results found.   LOS: 5 days   Bonnielee Haff, MD  [facility]    To contact the attending provider between 7A-7P or the covering provider during after hours 7P-7A, please log into  the web site [email or web address] and access using universal Regino Ramirez password for that web site. If you do not have the password, please call the hospital operator.  07/07/2021, 9:38 AM

## 2021-07-08 ENCOUNTER — Other Ambulatory Visit (HOSPITAL_COMMUNITY): Payer: Self-pay

## 2021-07-08 ENCOUNTER — Encounter (HOSPITAL_COMMUNITY): Payer: Self-pay | Admitting: Orthopaedic Surgery

## 2021-07-08 DIAGNOSIS — L089 Local infection of the skin and subcutaneous tissue, unspecified: Secondary | ICD-10-CM | POA: Diagnosis not present

## 2021-07-08 DIAGNOSIS — E11628 Type 2 diabetes mellitus with other skin complications: Secondary | ICD-10-CM | POA: Diagnosis not present

## 2021-07-08 LAB — GLUCOSE, CAPILLARY
Glucose-Capillary: 122 mg/dL — ABNORMAL HIGH (ref 70–99)
Glucose-Capillary: 105 mg/dL — ABNORMAL HIGH (ref 70–99)
Glucose-Capillary: 149 mg/dL — ABNORMAL HIGH (ref 70–99)
Glucose-Capillary: 172 mg/dL — ABNORMAL HIGH (ref 70–99)

## 2021-07-08 LAB — CBC
HCT: 25.4 % — ABNORMAL LOW (ref 39.0–52.0)
Hemoglobin: 8.5 g/dL — ABNORMAL LOW (ref 13.0–17.0)
MCH: 28.5 pg (ref 26.0–34.0)
MCHC: 33.5 g/dL (ref 30.0–36.0)
MCV: 85.2 fL (ref 80.0–100.0)
Platelets: 379 10*3/uL (ref 150–400)
RBC: 2.98 MIL/uL — ABNORMAL LOW (ref 4.22–5.81)
RDW: 13.4 % (ref 11.5–15.5)
WBC: 14 10*3/uL — ABNORMAL HIGH (ref 4.0–10.5)
nRBC: 0 % (ref 0.0–0.2)

## 2021-07-08 LAB — BASIC METABOLIC PANEL
Anion gap: 9 (ref 5–15)
BUN: 62 mg/dL — ABNORMAL HIGH (ref 6–20)
CO2: 18 mmol/L — ABNORMAL LOW (ref 22–32)
Calcium: 8.5 mg/dL — ABNORMAL LOW (ref 8.9–10.3)
Chloride: 105 mmol/L (ref 98–111)
Creatinine, Ser: 5.33 mg/dL — ABNORMAL HIGH (ref 0.61–1.24)
GFR, Estimated: 12 mL/min — ABNORMAL LOW (ref >60)
Glucose, Bld: 136 mg/dL — ABNORMAL HIGH (ref 70–99)
Potassium: 4.4 mmol/L (ref 3.5–5.1)
Sodium: 132 mmol/L — ABNORMAL LOW (ref 135–145)

## 2021-07-08 MED ORDER — METRONIDAZOLE 500 MG PO TABS
500.0000 mg | ORAL_TABLET | Freq: Two times a day (BID) | ORAL | Status: DC
  Administered 2021-07-08 – 2021-07-11 (×7): 500 mg via ORAL
  Filled 2021-07-08 (×7): qty 1

## 2021-07-08 NOTE — Progress Notes (Addendum)
Subjective: 1 Day Post-Op Procedure(s) (LRB): IRRIGATION AND DEBRIDEMENT LEFT FOOT (Left) APPLICATION OF WOUND VAC (Left) Patient reports pain as mild.    Objective: Vital signs in last 24 hours: Temp:  [97.6 F (36.4 C)-98.6 F (37 C)] 97.8 F (36.6 C) (08/30 0400) Pulse Rate:  [63-77] 73 (08/30 0400) Resp:  [13-20] 20 (08/30 0400) BP: (111-179)/(59-99) 169/95 (08/30 0400) SpO2:  [94 %-100 %] 97 % (08/30 0400) Weight:  [100.6 kg] 100.6 kg (08/30 0538)  Intake/Output from previous day: 08/29 0701 - 08/30 0700 In: 924.9 [P.O.:418; I.V.:343.8; IV Piggyback:163.1] Out: 2500 [Urine:2500] Intake/Output this shift: No intake/output data recorded.  Recent Labs    07/06/21 0606 07/07/21 0632 07/08/21 0115  HGB 8.9* 9.2* 8.5*   Recent Labs    07/07/21 0632 07/08/21 0115  WBC 14.9* 14.0*  RBC 3.23* 2.98*  HCT 27.4* 25.4*  PLT 404* 379   Recent Labs    07/07/21 0632 07/08/21 0115  NA 131* 132*  K 4.2 4.4  CL 105 105  CO2 17* 18*  BUN 66* 62*  CREATININE 5.42* 5.33*  GLUCOSE 214* 136*  CALCIUM 8.5* 8.5*   No results for input(s): LABPT, INR in the last 72 hours.  Physical Exam LLE- wound vac in place and properly functioning.  Approximately 38m blood in canister.   Assessment/Plan: 1 Day Post-Op Procedure(s) (LRB): IRRIGATION AND DEBRIDEMENT LEFT FOOT (Left) APPLICATION OF WOUND VAC (Left) Advance diet NWB LLE ABLA- mild and stable Continue wound vac- will need home wound vac unit and hh nurse for wound vac changes every MWF  Continue abx per primary/ID F/u with Dr. XErlinda Hong2 weeks post-op      MAundra Dubin8/30/2022, 8:01 AM

## 2021-07-08 NOTE — Progress Notes (Signed)
PHARMACY CONSULT NOTE FOR:  OUTPATIENT  PARENTERAL ANTIBIOTIC THERAPY (OPAT)  Indication: Diabetic Foot infection Regimen: Ancef 2gm IV q12h End date: 07/28/2021  IV antibiotic discharge orders are pended. To discharging provider:  please sign these orders via discharge navigator,  Select New Orders & click on the button choice - Manage This Unsigned Work.     Thank you for allowing pharmacy to be a part of this patient's care.  Hildred Laser, PharmD Clinical Pharmacist **Pharmacist phone directory can now be found on [email or web address] (PW [email or web address]).  Listed under [place].

## 2021-07-08 NOTE — Consult Note (Addendum)
I have seen and examined the patient. I have personally reviewed the clinical findings, laboratory findings, microbiological data and imaging studies. The assessment and treatment plan was discussed with the  Advance Practice Provider, Mauricio Po  I agree with her/his recommendations except following additions/corrections.  50 Year old male with PMH of Type 2 DM ( a1c 8.4) and CKD admitted for left foot DFU with MRI left foot concerning for soft tissue necrotizing infection/osteomyelitis of 5th metatarsal/possible septic arthritis of 5th MTP joint/Possible osteomyelitis of left 4th metatarsal. Patient is s/p excisional debridement and left 5th ray amputation/wound vac application on 7/32( OR cultures growing Pastuerella multocida, Group B Strep , Group G Strep, MSSA and Bacteroides vulagtus) and s/p revision amputation of left 5th metatarsal and excisional debridement on 07/07/21.   Febrile with leukocytosis on admission but since has been afebrile with down trending leukocytosis  Blood cultures 8/23  2/2 sets no growth in 5 days  Vancomycin and Zosyn has been de-escalated to cefazolin and PO metronidazole appropriately. Good pedal pulses bilaterally   Plan  Will need 6 weeks of antibiotics for concerns of Osteomyelitis given MRI findings and positive bone cultures ( initially 2-3 weeks IV cefazolin and PO metronidazole followed by PO Augmentin thereafter). Avoiding IV Unasyn for OPAT given frequent dosing requirements Will need central line for IV abtx, likely tunneled line given CKD- Defer to primary Baseline ESR and CRP  Monitor CBC and BMP BG control and wound care  OPAT orders to be placed by ID pharmacist  A follow up with [facility] has been made.  ID will sign off for now. Please call with questions.   Rosiland Oz, [place] [facility] [place] [facility]    Date of Admission:  07/01/2021     Total days of  antibiotics 8               Reason for Consult: Diabetic foot infection   Referring Provider: Dr. Candiss Norse Primary Care Provider: Physicians, [facility]   ASSESSMENT:  Mr. Lust is a pleasant 50 y/o caucasian gentlemen with diabetic foot ulcer s/p fifth ray amputation and I&D with polymicrobial infection with Pasturella multocida, Group B Strep, Group G Strep, MSSA, and Bacteroides vulgatus. Given concern for possible early osteomyelitis of the fourth metatarsal head recommend treating with IV antibiotics for at least 3 weeks and then consider change to oral medication. Will need central line placement by IR given CKD Stage V. Continue current dose of cefazolin and add renally dosed metronidazole. Continue wound care per Dr. Erlinda Hong. Discussed importance of maintaining blood sugar control (most recent 8.4) to reduce risk of complicated healing and further infection. OPAT / Home Health orders below.   PLAN:  Continue cefazolin Add metronidazole 500 mg PO q 12 IR central line placement  OPAT / Home Health orders  Wound care per Dr. Erlinda Hong. Blood sugar control and supportive care per primary team.    Diagnosis:  Polymicrobial diabetic foot infection s/p fifth ray amputation and I&D  Culture Result: Pasturella multocida, Group B Streptococcus, Group G Streptococcus, MSSA and Bacteroides vulgatus  No Known Allergies  OPAT Orders Discharge antibiotics to be given via PICC line Discharge antibiotics: Cefazolin and oral Metronidazole Per pharmacy protocol  Duration: 3 weeks End Date: 07/28/21   [facility] Care Per Protocol:  Home health RN for IV administration and teaching; PICC line care and labs.    Labs weekly while on IV antibiotics:  _X_ CBC with differential __X BMP __ CMP _X_ CRP _X_ ESR __ Vancomycin trough __ CK  __ Please pull PIC at completion of IV antibiotics _X_ Please leave PIC in place until doctor has seen patient or been notified  Fax weekly labs to [phone]  Clinic Follow Up Appt:  07/28/21 at 11:15am with Dr. West Bali  Principal Problem:   Diabetic infection of left foot ([place]) Active Problems:   DM2 (diabetes mellitus, type 2) ([place])   HTN (hypertension)   CKD (chronic kidney disease) stage 5, GFR less than 15 ml/min (HCC)    apixaban  5 mg Oral BID   atorvastatin  80 mg Oral Daily   Chlorhexidine Gluconate Cloth  6 each Topical Daily   fenofibrate  160 mg Oral Daily   insulin aspart  0-6 Units Subcutaneous TID WC   insulin glargine-yfgn  10 Units Subcutaneous Daily   metoprolol tartrate  75 mg Oral BID   metroNIDAZOLE  500 mg Oral Q12H   polyethylene glycol  17 g Oral Daily   sodium bicarbonate  650 mg Oral BID     HPI: Shamar Engelmann Burnsed is a 50 y.o. male with previous medical history of Type 2 diabetes, hypertension, and chronic kidney disease Stage V admitted with worsening left foot ulceration.   Mr. Pfiffner had a blister located on the lateral aspect of his left foot starting about 1 month ago when scarping his foot on outside of a pool and over the past week had developed pain, redness, odor and purulence. Elevated temperature on arrival of 100.3. Initial x-ray left foot with soft tissue gas centered at the fifth MTP joint and no radiographic evidence of osteomyelitis. MRI left foot with wound along the plantar aspect of the fifth metatarsal head and severe bone marrow edema in the fifth metatarsal concerning for osteomyelitis along with bone marrow edema in the plantar surface of the fourth metatarsal head concerning for early osteomyelitis. Dr. Erlinda Hong brought to the OR on 07/03/21 for excisional debridement, left fifth ray amputation, and application of wound VAC. Specimen obtained during surgery grew Pasteurella multiocida, Group B Streptococcus, Group G Streptococcus, MSSA, and Bacteroides vulgatus. Brought back to the OR on 07/07/21 for revision and excisional debridement.   Mr. Wohler has been afebrile since 07/03/21 with improving to  stable leukocytosis with most recent WBC count of 14. Currently on Day 8 of antimicrobial therapy. Has received vancomycin, piperacillin/tazobactam, and narrowed down to cefazolin on 8/29. Most recent A1c of 8.2 and creatinine 5.33. ID has been asked for antibiotic recommendations.   Review of Systems: Review of Systems  Constitutional:  Negative for chills, fever and weight loss.  Respiratory:  Negative for cough, shortness of breath and wheezing.   Cardiovascular:  Negative for chest pain and leg swelling.  Gastrointestinal:  Negative for abdominal pain, constipation, diarrhea, nausea and vomiting.  Skin:  Negative for rash.    Past Medical History:  Diagnosis Date   DM2 (diabetes mellitus, type 2) (HCC)    HTN (hypertension)     Social History   Tobacco Use   Smoking status: Never   Smokeless tobacco: Never  Vaping Use   Vaping Use: Never used  Substance Use Topics   Alcohol use: Yes    Comment: occ   Drug use: Never    Family History  Problem Relation Age of Onset   Stroke Mother     No Known Allergies  OBJECTIVE: Blood pressure (!) 171/96, pulse 70, temperature 97.8 F (  36.6 C), temperature source Oral, resp. rate 15, height _0  (1.88 m), weight 100.6 kg, SpO2 100 %.  Physical Exam Constitutional:      General: He is not in acute distress.    Appearance: He is well-developed.     Comments: Seated in the chair next to the bed; pleasant.   Cardiovascular:     Rate and Rhythm: Normal rate and regular rhythm.     Heart sounds: Normal heart sounds.  Pulmonary:     Effort: Pulmonary effort is normal.     Breath sounds: Normal breath sounds.  Musculoskeletal:     Comments: Wound vac in place on left foot.   Skin:    General: Skin is warm and dry.  Neurological:     Mental Status: He is alert and oriented to person, place, and time.  Psychiatric:        Mood and Affect: Mood normal.    Lab Results Lab Results  Component Value Date   WBC 14.0 (H)  07/08/2021   HGB 8.5 (L) 07/08/2021   HCT 25.4 (L) 07/08/2021   MCV 85.2 07/08/2021   PLT 379 07/08/2021    Lab Results  Component Value Date   CREATININE 5.33 (H) 07/08/2021   BUN 62 (H) 07/08/2021   NA 132 (L) 07/08/2021   K 4.4 07/08/2021   CL 105 07/08/2021   CO2 18 (L) 07/08/2021    Lab Results  Component Value Date   ALT 72 (H) 07/03/2021   AST 85 (H) 07/03/2021   ALKPHOS 76 07/03/2021   BILITOT 0.7 07/03/2021     Microbiology: Recent Results (from the past 240 hour(s))  Blood Culture (routine x 2)     Status: None   Collection Time: 07/01/21  8:45 PM   Specimen: BLOOD  Result Value Ref Range Status   Specimen Description BLOOD RIGHT ANTECUBITAL  Final   Special Requests   Final    BOTTLES DRAWN AEROBIC AND ANAEROBIC Blood Culture adequate volume   Culture   Final    NO GROWTH 5 DAYS Performed at [facility], [address], [place], [place] [address]    Report Status 07/06/2021 FINAL  Final  Blood Culture (routine x 2)     Status: None   Collection Time: 07/01/21  8:55 PM   Specimen: BLOOD  Result Value Ref Range Status   Specimen Description BLOOD LEFT ANTECUBITAL  Final   Special Requests   Final    BOTTLES DRAWN AEROBIC AND ANAEROBIC Blood Culture adequate volume   Culture   Final    NO GROWTH 5 DAYS Performed at [facility], [place] [address], [place], [place] [address]    Report Status 07/06/2021 FINAL  Final  Resp Panel by RT-PCR (Flu A&B, Covid) Nasopharyngeal Swab     Status: None   Collection Time: 07/02/21 12:51 AM   Specimen: Nasopharyngeal Swab; Nasopharyngeal(NP) swabs in vial transport medium  Result Value Ref Range Status   SARS Coronavirus 2 by RT PCR NEGATIVE NEGATIVE Final    Comment: (NOTE) SARS-CoV-2 target nucleic acids are NOT DETECTED.  The SARS-CoV-2 RNA is generally detectable in upper respiratory specimens during the acute phase of infection. The lowest concentration of SARS-CoV-2 viral copies this assay can detect  is 138 copies/mL. A negative result does not preclude SARS-Cov-2 infection and should not be used as the sole basis for treatment or other patient management decisions. A negative result may occur with  improper specimen collection/handling, submission  of specimen other than nasopharyngeal swab, presence of viral mutation(s) within the areas targeted by this assay, and inadequate number of viral copies(<138 copies/mL). A negative result must be combined with clinical observations, patient history, and epidemiological information. The expected result is Negative.  Fact Sheet for Patients:  EntrepreneurPulse.com.au  Fact Sheet for Healthcare Providers:  IncredibleEmployment.be  This test is no t yet approved or cleared by the [place] FDA and  has been authorized for detection and/or diagnosis of SARS-CoV-2 by FDA under an Emergency Use Authorization (EUA). This EUA will remain  in effect (meaning this test can be used) for the duration of the COVID-19 declaration under Section 564(b)(1) of the Act, 21 U.S.C.section 360bbb-3(b)(1), unless the authorization is terminated  or revoked sooner.       Influenza A by PCR NEGATIVE NEGATIVE Final   Influenza B by PCR NEGATIVE NEGATIVE Final    Comment: (NOTE) The Xpert Xpress SARS-CoV-2/FLU/RSV plus assay is intended as an aid in the diagnosis of influenza from Nasopharyngeal swab specimens and should not be used as a sole basis for treatment. Nasal washings and aspirates are unacceptable for Xpert Xpress SARS-CoV-2/FLU/RSV testing.  Fact Sheet for Patients: EntrepreneurPulse.com.au  Fact Sheet for Healthcare Providers: IncredibleEmployment.be  This test is not yet approved or cleared by the [place] FDA and has been authorized for detection and/or diagnosis of SARS-CoV-2 by FDA under an Emergency Use Authorization (EUA). This EUA will remain in effect  (meaning this test can be used) for the duration of the COVID-19 declaration under Section 564(b)(1) of the Act, 21 U.S.C. section 360bbb-3(b)(1), unless the authorization is terminated or revoked.  Performed at [facility], [place] [address] [place], [place] [address]   Urine Culture     Status: None   Collection Time: 07/02/21  2:55 AM   Specimen: In/Out Cath Urine  Result Value Ref Range Status   Specimen Description IN/OUT CATH URINE  Final   Special Requests NONE  Final   Culture   Final    NO GROWTH Performed at [facility], [place] [address] [place], [place] [address]    Report Status 07/03/2021 FINAL  Final  Surgical pcr screen     Status: None   Collection Time: 07/02/21  8:00 PM   Specimen: Nasal Mucosa; Nasal Swab  Result Value Ref Range Status   MRSA, PCR NEGATIVE NEGATIVE Final   Staphylococcus aureus NEGATIVE NEGATIVE Final    Comment: (NOTE) The Xpert SA Assay (FDA approved for NASAL specimens in patients 7 years of age and older), is one component of a comprehensive surveillance program. It is not intended to diagnose infection nor to guide or monitor treatment. Performed at [facility], [place] [address] [place], [place] [address]   Aerobic/Anaerobic Culture w Gram Stain (surgical/deep wound)     Status: None   Collection Time: 07/03/21  3:18 PM   Specimen: Soft Tissue, Other  Result Value Ref Range Status   Specimen Description TISSUE LEFT FOOT  Final   Special Requests NONE  Final   Gram Stain   Final    FEW SQUAMOUS EPITHELIAL CELLS PRESENT MODERATE WBC SEEN NO ORGANISMS SEEN    Culture   Final    FEW PASTEURELLA MULTOCIDA Usually susceptible to penicillin and other beta lactam agents,quinolones,macrolides and tetracyclines. RARE GROUP B STREP(S.AGALACTIAE)ISOLATED RARE STREPTOCOCCUS GROUP G Beta hemolytic streptococci are predictably susceptible to penicillin and other beta lactams. Susceptibility testing not routinely  performed. RARE STAPHYLOCOCCUS AUREUS  FEW BACTEROIDES VULGATUS CRITICAL RESULT CALLED TO, READ BACK BY AND VERIFIED WITH: T,BLACK RN _0  07/05/21 EB Performed at [place] [address] [place], [place] [address]    Report Status 07/07/2021 FINAL  Final   Organism ID, Bacteria STAPHYLOCOCCUS AUREUS  Final      Susceptibility   Staphylococcus aureus - MIC*    CIPROFLOXACIN <=0.5 SENSITIVE Sensitive     ERYTHROMYCIN <=0.25 SENSITIVE Sensitive     GENTAMICIN <=0.5 SENSITIVE Sensitive     OXACILLIN 0.5 SENSITIVE Sensitive     TETRACYCLINE <=1 SENSITIVE Sensitive     VANCOMYCIN 1 SENSITIVE Sensitive     TRIMETH/SULFA <=10 SENSITIVE Sensitive     CLINDAMYCIN <=0.25 SENSITIVE Sensitive     RIFAMPIN <=0.5 SENSITIVE Sensitive     Inducible Clindamycin NEGATIVE Sensitive     * RARE STAPHYLOCOCCUS AUREUS     Terri Piedra, NP [place] [facility]  07/08/2021  1:52 PM

## 2021-07-08 NOTE — Progress Notes (Signed)
PROGRESS NOTE        PATIENT DETAILS Name: Bruce Little Age: 50 y.o. Sex: male Date of Birth: [birth year] Admit Date: 07/01/2021 Admitting Physician Rise Patience, MD [id number], [facility]  Brief Narrative: Patient is a 50 y.o. male DM-2, CKD stage V, HTN-who presented with worsening left foot ulceration-found to have necrotizing left foot infection with underlying osteomyelitis and AKI on CKD stage V.  Evaluated by orthopedics-underwent debridement and subsequent left fifth ray amputation-postoperatively-went into A. fib with RVR (briefly).  Orthopedic plans on repeat incision/debridement on 8/29.  See below for further details.  Significant events: 8/23>> admit for left foot necrotizing infection-osteomyelitis-AKI 8/25>> underwent left fifth ray amputation-developed transient A. fib postoperatively requiring Cardizem infusion  Significant studies: 8/24>> MRI left foot: Osteomyelitis fifth metatarsal head-possible osteomyelitis of the fourth metatarsal head cellulitis with air in the forefoot-concern for necrotizing infection. 8/26>> Echo: EF 55-60%.  Antimicrobial therapy: Vancomycin: 8/23>> Zosyn: 8/23>>  Microbiology data: 8/23>> blood cultures: No growth 8/24>> urine culture: No growth 8/25>> Tissue culture (intraoperative): No growth  Procedures : 8/25>> excisional debridement,left foot fifth ray amputation-application of wound VAC.   Consults: Orthopedics, nephrology, cardiology ([place])  DVT Prophylaxis : SCDs Start: 07/03/21 1540 SCDs Start: 07/02/21 0551 V heparin. apixaban (ELIQUIS) tablet 5 mg   Subjective:  Patient in bed, appears comfortable, denies any headache, no fever, no chest pain or pressure, no shortness of breath , no abdominal pain. No new focal weakness.   Assessment/Plan:  Sepsis due to left foot necrotizing infection with underlying acute osteomyelitis:  Sepsis physiology has resolved, he underwent  fifth ray amputation on 8/25 + I&D again on 8/29 -   Wound VAC has been placed, on IV Ancef - Surgical cultures sent on 8/25 growing multiple organisms including Pasteurella multocida, strep agalactiae, strep group G and rare staph aureus.  Discussed with orthopedic surgeon on 07/09/2019, partial weightbearing on the left leg with normal gait on the left toes, 1 month consult ID as well.  He Case management consulted for arranging home wound VAC and dressing changes.  PAF with RVR on 8/25:  Developed RVR postoperatively on 8/25-briefly required Cardizem infusion.  Maintaining sinus rhythm-currently on metoprolol.  Stable TSH, stable echo with preserved EF-however CHA2DS2-VASc score from 4-after discussion with cardiology-currently on IV heparin with plans to transition to Eliquis on discharge.  Cardiology (Dr. Virgina Jock) will arrange for outpatient follow-up in his office.  Patient stroke advised as well question if he has had paroxysmal A. fib in the past.  Case management to arrange for 1 month Eliquis coupon.  AKI on CKD stage V: It appears that baseline creatinine is between 4.5 and 5.5.  Presented with a creatinine of 6.5.  AKI likely hemodynamically mediated-renal function has improved with IVF and now close to baseline. Recent renal biopsy showed diabetic nephropathy.  He has been seen by nephrology this admission, will need outpatient follow-up with Dr. Joelyn Oms.  Non anion gap metabolic acidosis: Due to CKD-has been started on oral bicarb.  Bicarbonate level is stable.  Continue to monitor.  Hyponatremia: Levels are stable for the most part.  Some fluctuation is noted.  Recent CVA (July 2022): No longer on Plavix-as he will be on anticoagulation.   DM-2 (A1c 8.4 on 8/24): CBGs relatively stable-continue Lantus 10 units-SSI-follow and adjust.  Recent Labs    07/07/21 1822 07/07/21 2032 07/08/21 0642  GLUCAP 162*  171* 122*    Essential hypertension: Continue beta-blocker.  Normocytic  anemia: Due to a combination of CKD stage V and acute illness.  PRBC transfusion on 8/25 while in the operating room. Hemoglobin remains stable.  No evidence of overt blood loss.     Obesity: Estimated body mass index is 28.48 kg/m as calculated from the following:   Height as of this encounter: '6\' 2"'$  (1.88 m).   Weight as of this encounter: 100.6 kg.    Diet: Diet Order             Diet heart healthy/carb modified Room service appropriate? Yes; Fluid consistency: Thin  Diet effective now                    Code Status: Full code  Family Communication: None at bedside.  Disposition Plan: Status is: Inpatient  Remains inpatient appropriate because:Inpatient level of care appropriate due to severity of illness  Dispo: The patient is from: Home              Anticipated d/c is to: Home              Patient currently is not medically stable to d/c.   Difficult to place patient No   Barriers to Discharge: -scheduled to go back to the OR on Monday for repeat I&D.  PT and OT to determine how much assistance he is going to need at discharge.   MEDICATIONS: Scheduled Meds:  apixaban  5 mg Oral BID   atorvastatin  80 mg Oral Daily   Chlorhexidine Gluconate Cloth  6 each Topical Daily   fenofibrate  160 mg Oral Daily   insulin aspart  0-6 Units Subcutaneous TID WC   insulin glargine-yfgn  10 Units Subcutaneous Daily   metoprolol tartrate  75 mg Oral BID   polyethylene glycol  17 g Oral Daily   sodium bicarbonate  650 mg Oral BID   Continuous Infusions:   ceFAZolin (ANCEF) IV 2 g (07/08/21 1008)   PRN Meds:.acetaminophen **OR** [DISCONTINUED] acetaminophen, fentaNYL (SUBLIMAZE) injection, oxyCODONE   PHYSICAL EXAM: Vital signs: Vitals:   07/07/21 2343 07/08/21 0400 07/08/21 0538 07/08/21 0755  BP: (!) 146/88 (!) 169/95  (!) 170/88  Pulse: 75 73  77  Resp: '18 20  19  '$ Temp: 97.8 F (36.6 C) 97.8 F (36.6 C)  97.9 F (36.6 C)  TempSrc: Oral Oral  Oral  SpO2:  99% 97%    Weight:   100.6 kg   Height:       Filed Weights   07/02/21 0434 07/03/21 1801 07/08/21 0538  Weight: 122 kg 106.3 kg 100.6 kg   Body mass index is 28.48 kg/m.    Exam   Awake Alert, No new F.N deficits, Normal affect [place].AT,PERRAL Supple Neck,No JVD, No cervical lymphadenopathy appriciated.  Symmetrical Chest wall movement, Good air movement bilaterally, CTAB RRR,No Gallops, Rubs or new Murmurs, No Parasternal Heave +ve B.Sounds, Abd Soft, No tenderness, No organomegaly appriciated, No rebound - guarding or rigidity. L.foot 5th Ray amputation with W Vac    I have personally reviewed following labs and imaging studies  LABORATORY DATA: CBC: Recent Labs  Lab 07/01/21 2055 07/02/21 0445 07/03/21 0024 07/04/21 0601 07/05/21 0028 07/06/21 0606 07/07/21 0632 07/08/21 0115  WBC 26.6* 26.7*   < > 23.0* 18.8* 16.4* 14.9* 14.0*  NEUTROABS 25.5* 22.1*  --   --   --   --   --   --   HGB  8.4* 9.2*   < > 9.9* 9.0* 8.9* 9.2* 8.5*  HCT 25.7* 27.4*   < > 28.8* 26.7* 27.1* 27.4* 25.4*  MCV 84.8 84.3   < > 83.5 84.2 86.0 84.8 85.2  PLT 411* 435*   < > 381 385 379 404* 379   < > = values in this interval not displayed.    Basic Metabolic Panel: Recent Labs  Lab 07/03/21 0024 07/03/21 1240 07/04/21 0601 07/05/21 0028 07/06/21 0606 07/07/21 0632 07/08/21 0115  NA 127*   < > 130* 129* 129* 131* 132*  K 3.8   < > 3.9 4.2 4.0 4.2 4.4  CL 100   < > 102 103 103 105 105  CO2 17*  --  15* 16* 17* 17* 18*  GLUCOSE 147*   < > 147* 179* 141* 214* 136*  BUN 58*   < > 54* 57* 59* 66* 62*  CREATININE 6.11*   < > 5.70* 5.47* 5.48* 5.42* 5.33*  CALCIUM 8.3*  --  8.9 8.4* 8.5* 8.5* 8.5*  MG 1.7  --   --   --   --   --   --   PHOS  --   --  5.2*  --   --   --   --    < > = values in this interval not displayed.    GFR: Estimated Creatinine Clearance: 21.2 mL/min (A) (by C-G formula based on SCr of 5.33 mg/dL (H)).  Liver Function Tests: Recent Labs  Lab 07/01/21 2055  07/02/21 0445 07/03/21 0024 07/04/21 0601  AST 80* 94* 85*  --   ALT 72* 82* 72*  --   ALKPHOS 82 90 76  --   BILITOT 0.5 0.8 0.7  --   PROT 8.0 8.4* 6.4*  --   ALBUMIN 2.7* 2.7* 1.9* 2.0*     Coagulation Profile: Recent Labs  Lab 07/01/21 2055  INR 1.5*    Thyroid Function Tests: No results for input(s): TSH, T4TOTAL, FREET4, T3FREE, THYROIDAB in the last 72 hours.   Urine analysis:    Component Value Date/Time   COLORURINE YELLOW 07/01/2021 2036   APPEARANCEUR HAZY (A) 07/01/2021 2036   LABSPEC 1.016 07/01/2021 2036   PHURINE 5.0 07/01/2021 2036   GLUCOSEU 150 (A) 07/01/2021 2036   HGBUR MODERATE (A) 07/01/2021 2036   BILIRUBINUR NEGATIVE 07/01/2021 2036   KETONESUR NEGATIVE 07/01/2021 2036   PROTEINUR 100 (A) 07/01/2021 2036   NITRITE NEGATIVE 07/01/2021 2036   LEUKOCYTESUR NEGATIVE 07/01/2021 2036    Sepsis Labs: Lactic Acid, Venous    Component Value Date/Time   LATICACIDVEN 0.7 07/02/2021 0616     RADIOLOGY STUDIES/RESULTS: No results found.   LOS: 6 days   Signature  Lala Lund M.D on 07/08/2021 at 10:50 AM   -  To page go to [email or web address]

## 2021-07-08 NOTE — Progress Notes (Addendum)
Physical Therapy Treatment Patient Details Name: Bruce Little MRN: [id number] DOB: 1971/03/03 Today's Date: 07/08/2021    History of Present Illness Pt is a 50 y/o male who presented with worsening L foot ulceraton found to have necrotizing left foot infection with underlying osteomyelitis and AKI. 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 PMH: DM II, CKD, HTN    PT Comments    Patient received in recliner, wife present in room. Brought knee scooter for patient to try out. Cues given for safety and use of scooter. He ambulated 50 feet in room with knee scooter with min guard. Patient demonstrates good safety awareness with this device and will benefit from continued practice with transfers, short ambulation distances and use of knee scooter prior to returning home.        Follow Up Recommendations  No PT follow up;Supervision for mobility/OOB;Supervision - Intermittent     Equipment Recommendations  Rolling walker with 5" wheels;Other (comment) (knee scooter) if he is going to be NWB for extended time may need wheelchair.   Recommendations for Other Services       Precautions / Restrictions Precautions Precautions: Fall Precaution Comments: L foot wound vac Restrictions Weight Bearing Restrictions: Yes LLE Weight Bearing: Non weight bearing    Mobility  Bed Mobility Overal bed mobility: Modified Independent             General bed mobility comments: patient in recliner this pm    Transfers Overall transfer level: Needs assistance Equipment used: Ambulation equipment used Transfers: Sit to/from Stand Sit to Stand: Min guard         General transfer comment: Cues needed and assist to steady knee scooter.  Ambulation/Gait Ambulation/Gait assistance: Min guard Gait Distance (Feet): 50 Feet Assistive device:  (knee scooter) Gait Pattern/deviations: Step-to pattern Gait velocity: decr   General Gait Details:  patient demonstrates good use and safety with knee scooter. Will benefit from additional practice prior to discharge.   Stairs             Wheelchair Mobility    Modified Rankin (Stroke Patients Only)       Balance Overall balance assessment: Needs assistance Sitting-balance support: Feet supported Sitting balance-Leahy Scale: Normal     Standing balance support: Bilateral upper extremity supported;During functional activity Standing balance-Leahy Scale: Fair Standing balance comment: reliant on UE support for mobility due to WB precautions                            Cognition Arousal/Alertness: Awake/alert Behavior During Therapy: WFL for tasks assessed/performed Overall Cognitive Status: Within Functional Limits for tasks assessed                                        Exercises      General Comments        Pertinent Vitals/Pain Pain Assessment: Faces Faces Pain Scale: Hurts a little bit Pain Location: L foot Pain Descriptors / Indicators: Discomfort Pain Intervention(s): Monitored during session;Premedicated before session    Home Living Family/patient expects to be discharged to:: Private residence Living Arrangements: Spouse/significant other Available Help at Discharge: Family;Available 24 hours/day Type of Home: House Home Access: Stairs to enter   Home Layout: Two level;Able to live on main level with bedroom/bathroom Home Equipment: None Additional Comments: patient is able  to stay on first floor as needed with 1/2 bath    Prior Function Level of Independence: Independent      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   PT Goals (current goals can now be found in the care plan section) Acute Rehab PT Goals Patient Stated Goal: to return home PT Goal Formulation: With patient/family Time For Goal Achievement: 07/15/21 Potential to Achieve Goals: Good Progress towards PT goals: Progressing toward goals    Frequency     Min 3X/week      PT Plan Current plan remains appropriate    Co-evaluation              AM-PAC PT "6 Clicks" Mobility   Outcome Measure  Help needed turning from your back to your side while in a flat bed without using bedrails?: None Help needed moving from lying on your back to sitting on the side of a flat bed without using bedrails?: None Help needed moving to and from a bed to a chair (including a wheelchair)?: A Little Help needed standing up from a chair using your arms (e.g., wheelchair or bedside chair)?: A Little Help needed to walk in hospital room?: A Little Help needed climbing 3-5 steps with a railing? : A Lot 6 Click Score: 19    End of Session Equipment Utilized During Treatment: Gait belt Activity Tolerance: Patient tolerated treatment well Patient left: in chair;with call bell/phone within reach;with family/visitor present Nurse Communication: Mobility status PT Visit Diagnosis: Unsteadiness on feet (R26.81);Other abnormalities of gait and mobility (R26.89);Pain;Difficulty in walking, not elsewhere classified (R26.2) Pain - Right/Left: Left Pain - part of body: Ankle and joints of foot     Time: [id number] PT Time Calculation (min) (ACUTE ONLY): 20 min  Charges:  $Gait Training: 8-22 mins                    Brittini Brubeck, PT, GCS 07/08/21,2:05 PM

## 2021-07-08 NOTE — Evaluation (Signed)
Physical Therapy Evaluation Patient Details Name: Bruce Little MRN: [id number] DOB: 07/08/1971 Today's Date: 07/08/2021   History of Present Illness  Pt is a 50 y/o male who presented with worsening L foot ulceraton found to have necrotizing left foot infection with underlying osteomyelitis and AKI. 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 PMH: DM II, CKD, HTN   Clinical Impression  Patient received in bed, agreeable to mobility. He is pleasant and cooperative. Performed bed mobility with mod independence. Transfers with min guard. Cues for hand placement and WB status. Patient ambulated in room with RW and min guard/supervision. Good balance but difficulty maintaining true NWB, putting weight through heel of L foot. Patient will continue to benefit from skilled PT while here to improve mobility, safety and independence.         Follow Up Recommendations      Equipment Recommendations  Rolling walker with 5" wheels;Other (comment) (possibly knee scooter if truly NWB)    Recommendations for Other Services       Precautions / Restrictions Precautions Precautions: Fall Precaution Comments: L foot wound vac Restrictions Weight Bearing Restrictions: Yes LLE Weight Bearing: Non weight bearing      Mobility  Bed Mobility Overal bed mobility: Modified Independent             General bed mobility comments: HOB elevated    Transfers Overall transfer level: Needs assistance Equipment used: Rolling walker (2 wheeled) Transfers: Sit to/from Stand Sit to Stand: Min guard         General transfer comment: min guard for safety, cues for NWB L LE, cues for hand placement  Ambulation/Gait Ambulation/Gait assistance: Min guard;Supervision Gait Distance (Feet): 25 Feet Assistive device: Rolling walker (2 wheeled) Gait Pattern/deviations: Step-to pattern Gait velocity: decr   General Gait Details: patient ambulating with heel  weight bearing on left. Attempted NWB but had increased difficulty with this. Attempting to clarify WB status with MD. Patient reports he was told he can bear weight through heel.  Stairs            Wheelchair Mobility    Modified Rankin (Stroke Patients Only)       Balance Overall balance assessment: Needs assistance Sitting-balance support: Feet supported Sitting balance-Leahy Scale: Normal     Standing balance support: Bilateral upper extremity supported;During functional activity Standing balance-Leahy Scale: Fair Standing balance comment: reliant on UE support for mobility due to WB precautions                             Pertinent Vitals/Pain Pain Assessment: Faces Faces Pain Scale: Hurts little more Pain Location: L foot Pain Descriptors / Indicators: Guarding;Grimacing Pain Intervention(s): Monitored during session;Repositioned    Home Living Family/patient expects to be discharged to:: Private residence Living Arrangements: Spouse/significant other Available Help at Discharge: Family;Available 24 hours/day Type of Home: House Home Access: Stairs to enter   [employer] of Steps: 1 Home Layout: Two level;Able to live on main level with bedroom/bathroom Home Equipment: None Additional Comments: patient is able to stay on first floor as needed with 1/2 bath    Prior Function Level of Independence: Independent         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     Hand Dominance   Dominant Hand: Right    Extremity/Trunk Assessment   Upper Extremity Assessment Upper Extremity Assessment: Overall WFL for tasks assessed  Lower Extremity Assessment Lower Extremity Assessment: LLE deficits/detail LLE Coordination: decreased gross motor    Cervical / Trunk Assessment Cervical / Trunk Assessment: Normal  Communication   Communication: No difficulties  Cognition Arousal/Alertness: Awake/alert Behavior During Therapy: WFL for tasks  assessed/performed Overall Cognitive Status: Within Functional Limits for tasks assessed                                        General Comments General comments (skin integrity, edema, etc.): VSS on RA. Pt under impression that WB through heel ok--proceeded to do this during session though educated on MD orders in system    Exercises     Assessment/Plan    PT Assessment Patient needs continued PT services  PT Problem List Decreased mobility;Decreased activity tolerance;Decreased balance;Pain;Decreased knowledge of use of DME;Decreased knowledge of precautions       PT Treatment Interventions DME instruction;Gait training;Balance training;Stair training;Functional mobility training;Therapeutic activities;Patient/family education    PT Goals (Current goals can be found in the Care Plan section)  Acute Rehab PT Goals Patient Stated Goal: to return home PT Goal Formulation: With patient Time For Goal Achievement: 07/15/21 Potential to Achieve Goals: Good    Frequency Min 3X/week   Barriers to discharge Inaccessible home environment      Co-evaluation               AM-PAC PT "6 Clicks" Mobility  Outcome Measure Help needed turning from your back to your side while in a flat bed without using bedrails?: None Help needed moving from lying on your back to sitting on the side of a flat bed without using bedrails?: None Help needed moving to and from a bed to a chair (including a wheelchair)?: A Little Help needed standing up from a chair using your arms (e.g., wheelchair or bedside chair)?: A Little Help needed to walk in hospital room?: A Little Help needed climbing 3-5 steps with a railing? : A Little 6 Click Score: 20    End of Session Equipment Utilized During Treatment: Gait belt Activity Tolerance: Patient tolerated treatment well Patient left: with call bell/phone within reach Nurse Communication: Mobility status PT Visit Diagnosis: Unsteadiness  on feet (R26.81);Other abnormalities of gait and mobility (R26.89);Pain;Difficulty in walking, not elsewhere classified (R26.2) Pain - Right/Left: Left Pain - part of body: Ankle and joints of foot    Time: [id number] PT Time Calculation (min) (ACUTE ONLY): 20 min   Charges:   PT Evaluation $PT Eval Moderate Complexity: 1 Mod          Alecia Doi, PT, GCS 07/08/21,10:19 AM

## 2021-07-08 NOTE — Evaluation (Signed)
Occupational Therapy Evaluation Patient Details Name: Bruce Little MRN: [id number] DOB: [birth year] Today's Date: 07/08/2021    History of Present Illness Pt is a 50 y/o male who presented with worsening L foot ulceraton found to have necrotizing left foot infection with underlying osteomyelitis and AKI. 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 PMH: DM II, CKD, HTN   Clinical Impression   PTA, pt lives with family and reports Independence in all daily tasks without use of AD. Pt presents now with expected post op pain of L LE. Pt overall Modified Independent with bed mobility and min guard for mobility using RW. Pt under impression that WB through heel is ok post-op though therapists educated on NWB L LE orders in chart (awaiting clarification from MD/PA). Pt overall Independent for UB ADLs and Min A for LB ADLs. Began education on appropriate DME to maintain precautions, as well as compensatory strategies for LB ADLs with further reinforcement needed.     Follow Up Recommendations  No OT follow up;Supervision - Intermittent    Equipment Recommendations  3 in 1 bedside commode;Other (comment) (Rolling walker)    Recommendations for Other Services       Precautions / Restrictions Precautions Precautions: Fall Precaution Comments: L foot wound vac Restrictions Weight Bearing Restrictions: Yes LLE Weight Bearing: Non weight bearing      Mobility Bed Mobility Overal bed mobility: Modified Independent             General bed mobility comments: HOB elevated    Transfers Overall transfer level: Needs assistance Equipment used: Rolling walker (2 wheeled) Transfers: Sit to/from Stand Sit to Stand: Min guard         General transfer comment: min guard for safety, cues for NWB L LE    Balance Overall balance assessment: Needs assistance Sitting-balance support: No upper extremity supported;Feet supported Sitting  balance-Leahy Scale: Good     Standing balance support: Bilateral upper extremity supported;During functional activity Standing balance-Leahy Scale: Poor Standing balance comment: reliant on UE support for mobility due to WB precautions                           ADL either performed or assessed with clinical judgement   ADL Overall ADL's : Needs assistance/impaired Eating/Feeding: Independent   Grooming: Min guard;Standing   Upper Body Bathing: Independent;Sitting   Lower Body Bathing: Minimal assistance;Sit to/from stand;Sitting/lateral leans   Upper Body Dressing : Independent;Sitting   Lower Body Dressing: Minimal assistance;Sit to/from stand;Sitting/lateral leans       Toileting- Clothing Manipulation and Hygiene: Min guard;Sit to/from stand;Sitting/lateral lean         General ADL Comments: Pt with expected post op pain, noted NWB L LE in orders though pt reports being told WB thru heel ok (awaiting clarification from MDs) - will need increased assist for balance when following NWB L LE consistently.     Vision Baseline Vision/History: 0 No visual deficits Ability to See in Adequate Light: 0 Adequate Vision Assessment?: No apparent visual deficits     Perception     Praxis      Pertinent Vitals/Pain Pain Assessment: Faces Faces Pain Scale: Hurts little more Pain Location: L foot Pain Descriptors / Indicators: Guarding;Grimacing Pain Intervention(s): Monitored during session     Hand Dominance Right   Extremity/Trunk Assessment Upper Extremity Assessment Upper Extremity Assessment: Overall WFL for tasks assessed   Lower Extremity Assessment  Lower Extremity Assessment: Defer to PT evaluation   Cervical / Trunk Assessment Cervical / Trunk Assessment: Normal   Communication Communication Communication: No difficulties   Cognition Arousal/Alertness: Awake/alert Behavior During Therapy: WFL for tasks assessed/performed Overall Cognitive  Status: Within Functional Limits for tasks assessed                                     General Comments  VSS on RA. Pt under impression that WB through heel ok--proceeded to do this during session though educated on MD orders in system    Exercises     Shoulder Instructions      Home Living Family/patient expects to be discharged to:: Private residence Living Arrangements: Spouse/significant other Available Help at Discharge: Family;Available 24 hours/day Type of Home: House Home Access: Stairs to enter [employer] of Steps: 1   Home Layout: Two level Alternate Level Stairs-Number of Steps: flight Alternate Level Stairs-Rails: Right Bathroom Shower/Tub: Teacher, early years/pre: Standard     Home Equipment: None          Prior Functioning/Environment Level of Independence: Independent        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        OT Problem List: Impaired balance (sitting and/or standing);Pain;Decreased knowledge of use of DME or AE;Decreased knowledge of precautions      OT Treatment/Interventions: Self-care/ADL training;Therapeutic exercise;DME and/or AE instruction;Therapeutic activities;Patient/family education    OT Goals(Current goals can be found in the care plan section) Acute Rehab OT Goals Patient Stated Goal: go home, recover and have wound vac removed soon OT Goal Formulation: With patient Time For Goal Achievement: 07/21/21 Potential to Achieve Goals: Good  OT Frequency: Min 2X/week   Barriers to D/C:            Co-evaluation              AM-PAC OT "6 Clicks" Daily Activity     Outcome Measure Help from another person eating meals?: None Help from another person taking care of personal grooming?: A Little Help from another person toileting, which includes using toliet, bedpan, or urinal?: A Little Help from another person bathing (including washing, rinsing, drying)?: A Little Help from another person  to put on and taking off regular upper body clothing?: None Help from another person to put on and taking off regular lower body clothing?: A Little 6 Click Score: 20   End of Session Equipment Utilized During Treatment: Gait belt;Rolling walker Nurse Communication: Mobility status;Weight bearing status  Activity Tolerance: Patient tolerated treatment well Patient left: in chair;with call bell/phone within reach  OT Visit Diagnosis: Unsteadiness on feet (R26.81);Other abnormalities of gait and mobility (R26.89);Pain Pain - Right/Left: Left Pain - part of body: Ankle and joints of foot                Time: [id number] OT Time Calculation (min): 17 min Charges:  OT General Charges $OT Visit: 1 Visit OT Evaluation $OT Eval Moderate Complexity: 1 Mod  Malachy Chamber, OTR/L Acute Rehab Services Office: [phone]   Layla Maw 07/08/2021, 9:53 AM

## 2021-07-08 NOTE — TOC Benefit Eligibility Note (Signed)
Patient Teacher, English as a foreign language completed.    The patient is currently admitted and upon discharge could be taking Eliquis 5 mg.  The current 30 day co-pay is, $0.00.   The patient is insured through [place] Patient [place] Team Direct Number: [phone]  Fax: [phone]

## 2021-07-08 NOTE — TOC Initial Note (Signed)
Transition of Care (TOC) - Initial/Assessment Note  Marvetta Gibbons RN, BSN Transitions of Care Unit 4E- RN Case Manager See Treatment Team for direct phone #    Patient Details  Name: Bruce Little MRN: [id number] Date of Birth: Jun 28, 1971  Transition of Care [facility]) CM/SW Contact:    Dawayne Patricia, RN Phone Number: 07/08/2021, 3:27 PM  Clinical Narrative:                 Pt s/p I&D and revision of left foot toe amputation site. Wound VAC placed. From home w/ wife. Per MD pt will need HHRN and home wound VAC.  MD has singed [employer] home vac order form and this has been faxed into [place] at [facility] to start process for insurance approval. Once approved- home VAC will be released for delivery here to the hospital.   CM spoke with pt and wife at the bedside for transition of care needs. List provided for [facility] choice Per CMS guidelines from medicare.gov website with star ratings (copy placed in shadow chart) - per conversation they do not have a preference and will defer to [facility] to secure a [place] for needed services on patient's behalf.  Discussed DME needs- wife getting shower chair and possibly a scooter. Only DME request for discharge is a RW- agreeable to use in house DME provider to deliver RW to room prior to discharge. Also informed pt regarding home wound VAC arrangements with [employer] and pending insurance approval.   1500- on chart review noted ID now recommending 3 wks of IV abx with end date 9/19- pt will need PICC line. In addition to home wound VAC pt will also need HH for home IV abx needs.  Call made to [facility] with [place] to see if they can assist with needed [place]- vac and abx needs- after review- informed by Stacie that [facility] is unable to accept due to low RN staffing in pt's area. TOC will continue to reach out to other [facility] agencies   1515-Call made to [facility] with [facility] for [facility] abx needs- awaiting return call.   Expected Discharge Plan: [place] Barriers  to Discharge: Continued Medical Work up   Patient Goals and CMS Choice Patient states their goals for this hospitalization and ongoing recovery are:: return home CMS Medicare.gov Compare Post Acute Care list provided to:: Patient Choice offered to / list presented to : Patient, Spouse  Expected Discharge Plan and Services Expected Discharge Plan: [place]   Discharge Planning Services: CM Consult Post Acute Care Choice: Durable Medical Equipment, Home Health Living arrangements for the past 2 months: Single Family Home                 DME Arranged: Gilford Rile rolling, Vac DME Agency: Rachelle Hora Date DME Agency Contacted: 07/08/21 Time DME Agency Contacted: 1207 Representative spoke with at DME Agency: Olivia Mackie HH Arranged: RN          Prior Living Arrangements/Services Living arrangements for the past 2 months: [place] with:: Self, Spouse Patient language and need for interpreter reviewed:: Yes Do you feel safe going back to the place where you live?: Yes      Need for Family Participation in Patient Care: Yes (Comment) Care giver support system in place?: Yes (comment) Current home services: DME Criminal Activity/Legal Involvement Pertinent to Current Situation/Hospitalization: No - Comment as needed  Activities of Daily Living Home Assistive Devices/Equipment: CBG Meter ADL Screening (  condition at time of admission) Patient's cognitive ability adequate to safely complete daily activities?: Yes Is the patient deaf or have difficulty hearing?: No Does the patient have difficulty seeing, even when wearing glasses/contacts?: No Does the patient have difficulty concentrating, remembering, or making decisions?: No Patient able to express need for assistance with ADLs?: Yes Does the patient have difficulty dressing or bathing?: No Independently performs ADLs?: Yes (appropriate for developmental age) Does the patient have difficulty walking  or climbing stairs?: No Weakness of Legs: None Weakness of Arms/Hands: None  Permission Sought/Granted Permission sought to share information with : Chartered certified accountant granted to share information with : Yes, Verbal Permission Granted  Share Information with NAME: Mohib Bertram  Permission granted to share info w AGENCY: HH/DME  Permission granted to share info w Relationship: wife     Emotional Assessment Appearance:: Appears stated age Attitude/Demeanor/Rapport: Engaged Affect (typically observed): Appropriate, Accepting, Pleasant Orientation: : Oriented to Self, Oriented to Place, Oriented to  Time, Oriented to Situation Alcohol / Substance Use: Not Applicable Psych Involvement: No (comment)  Admission diagnosis:  Cellulitis of foot [L03.119] Wound infection [T14.8XXA, L08.9] Sepsis ([place]) [A41.9] Severe sepsis ([place]) [A41.9, R65.20] Patient Active Problem List   Diagnosis Date Noted   Diabetic infection of left foot ([place]) 07/03/2021   CKD (chronic kidney disease) stage 5, GFR less than 15 ml/min ([place]) 07/02/2021   Renal insufficiency 05/20/2021   DM2 (diabetes mellitus, type 2) ([place]) 05/20/2021   Acute ischemic stroke ([place]) 05/20/2021   HTN (hypertension) 05/20/2021   PCP:  Physicians, [place]:   [facility] [place], [place] [facility] [place] [id number] [place] [id number] Phone: [phone] Fax: [phone]     Social Determinants of Health (SDOH) Interventions    Readmission Risk Interventions No flowsheet data found.

## 2021-07-09 ENCOUNTER — Inpatient Hospital Stay: Payer: BC Managed Care – PPO | Admitting: Adult Health

## 2021-07-09 ENCOUNTER — Inpatient Hospital Stay (HOSPITAL_COMMUNITY): Payer: BC Managed Care – PPO

## 2021-07-09 HISTORY — PX: IR US GUIDE VASC ACCESS RIGHT: IMG2390

## 2021-07-09 HISTORY — PX: IR FLUORO GUIDE CV LINE RIGHT: IMG2283

## 2021-07-09 LAB — CBC WITH DIFFERENTIAL/PLATELET
Abs Immature Granulocytes: 0.21 10*3/uL — ABNORMAL HIGH (ref 0.00–0.07)
Basophils Absolute: 0 10*3/uL (ref 0.0–0.1)
Basophils Relative: 0 %
Eosinophils Absolute: 0.4 10*3/uL (ref 0.0–0.5)
Eosinophils Relative: 4 %
HCT: 27.2 % — ABNORMAL LOW (ref 39.0–52.0)
Hemoglobin: 9.2 g/dL — ABNORMAL LOW (ref 13.0–17.0)
Immature Granulocytes: 2 %
Lymphocytes Relative: 12 %
Lymphs Abs: 1.4 10*3/uL (ref 0.7–4.0)
MCH: 28.7 pg (ref 26.0–34.0)
MCHC: 33.8 g/dL (ref 30.0–36.0)
MCV: 84.7 fL (ref 80.0–100.0)
Monocytes Absolute: 0.7 10*3/uL (ref 0.1–1.0)
Monocytes Relative: 6 %
Neutro Abs: 9.5 10*3/uL — ABNORMAL HIGH (ref 1.7–7.7)
Neutrophils Relative %: 76 %
Platelets: 379 10*3/uL (ref 150–400)
RBC: 3.21 MIL/uL — ABNORMAL LOW (ref 4.22–5.81)
RDW: 13.5 % (ref 11.5–15.5)
WBC: 12.3 10*3/uL — ABNORMAL HIGH (ref 4.0–10.5)
nRBC: 0 % (ref 0.0–0.2)

## 2021-07-09 LAB — COMPREHENSIVE METABOLIC PANEL
ALT: 17 U/L (ref 0–44)
AST: 23 U/L (ref 15–41)
Albumin: 1.8 g/dL — ABNORMAL LOW (ref 3.5–5.0)
Alkaline Phosphatase: 77 U/L (ref 38–126)
Anion gap: 10 (ref 5–15)
BUN: 58 mg/dL — ABNORMAL HIGH (ref 6–20)
CO2: 17 mmol/L — ABNORMAL LOW (ref 22–32)
Calcium: 8.6 mg/dL — ABNORMAL LOW (ref 8.9–10.3)
Chloride: 104 mmol/L (ref 98–111)
Creatinine, Ser: 5.1 mg/dL — ABNORMAL HIGH (ref 0.61–1.24)
GFR, Estimated: 13 mL/min — ABNORMAL LOW (ref >60)
Glucose, Bld: 176 mg/dL — ABNORMAL HIGH (ref 70–99)
Potassium: 4 mmol/L (ref 3.5–5.1)
Sodium: 131 mmol/L — ABNORMAL LOW (ref 135–145)
Total Bilirubin: 0.3 mg/dL (ref 0.3–1.2)
Total Protein: 6.5 g/dL (ref 6.5–8.1)

## 2021-07-09 LAB — GLUCOSE, CAPILLARY
Glucose-Capillary: 139 mg/dL — ABNORMAL HIGH (ref 70–99)
Glucose-Capillary: 131 mg/dL — ABNORMAL HIGH (ref 70–99)
Glucose-Capillary: 144 mg/dL — ABNORMAL HIGH (ref 70–99)
Glucose-Capillary: 132 mg/dL — ABNORMAL HIGH (ref 70–99)

## 2021-07-09 LAB — MAGNESIUM: Magnesium: 2.1 mg/dL (ref 1.7–2.4)

## 2021-07-09 MED ORDER — METOPROLOL TARTRATE 100 MG PO TABS
100.0000 mg | ORAL_TABLET | Freq: Two times a day (BID) | ORAL | Status: DC
  Administered 2021-07-09 – 2021-07-11 (×4): 100 mg via ORAL
  Filled 2021-07-09 (×4): qty 1

## 2021-07-09 MED ORDER — FENTANYL CITRATE PF 50 MCG/ML IJ SOSY
PREFILLED_SYRINGE | INTRAMUSCULAR | Status: AC
  Filled 2021-07-09: qty 1

## 2021-07-09 MED ORDER — HYDRALAZINE HCL 25 MG PO TABS: 25.0000 mg | ORAL_TABLET | Freq: Four times a day (QID) | ORAL | Status: DC | PRN

## 2021-07-09 MED ORDER — LIDOCAINE-EPINEPHRINE 1 %-1:100000 IJ SOLN
INTRAMUSCULAR | Status: AC
  Filled 2021-07-09: qty 1

## 2021-07-09 MED ORDER — AMLODIPINE BESYLATE 10 MG PO TABS
10.0000 mg | ORAL_TABLET | Freq: Every day | ORAL | Status: DC
  Administered 2021-07-09 – 2021-07-11 (×3): 10 mg via ORAL
  Filled 2021-07-09 (×3): qty 1

## 2021-07-09 MED ORDER — FENTANYL CITRATE PF 50 MCG/ML IJ SOSY: 25.0000 ug | PREFILLED_SYRINGE | INTRAMUSCULAR | Status: DC | PRN

## 2021-07-09 NOTE — Progress Notes (Signed)
Dressing placed on Wound. Calcium alginate place in wound bed with wet to dry 4x4, Kerlix and Ace wrap. Dr.XU called for update on dressing status . New order change pressure dressing q12Hours and can remove calcium alginate next dressing change. Family updated   Will continue to monitor  Phoebe Sharps, RN

## 2021-07-09 NOTE — Consult Note (Signed)
WOC contacted by bedside RN, the site was oozing with my dressing change this am.  I applied pressure but it continued to ooze and blood collected under the drape.  I was able to obtain seal without difficulty.  Bedside nurse has trouble shot with canister change about 3 hours later when the machine began to alarm clotted.  She appropriately changed the canister first.  1-2 hours later she changed dressing appropriately and we discussed how she reapplied the dressing. She has performed the dressing change appropriately.  I then requested that she order new VAC dressing and and ostomy barrier rings to use to aid in the seal of the dressing.   The seal of the dressing is not the issue, the current issue is active bleeding under the VAC sponge that is clotting off in the foam dressing. We have tried to place the [facility] pad at different locations as well.    I received a call at 215 about the VAC dressing again alarming and the need to address. The bedside nurse has contacted the orthopedic surgeon and discussed. I am planning to DC the NPWT and replace with topical wound care orders.   I received a second call at 245 that the staff would like me to contact the orthopedic surgeon to discuss further.  I have contacted Dr. Erlinda Hong, and discussed the use of calcium alginate (hemostatic properties and absorptive) and fluff gauze.  His direction was to hold pressure which he have done.  He agrees with plan to DC NPWT and apply topical care.   I have sent a message to the bedside staff as well and I have updated the wound care orders.   [place], [place], [place]

## 2021-07-09 NOTE — Procedures (Signed)
Interventional Radiology Procedure Note  Procedure: Tunneled central venous catheter placement  Findings: Please refer to procedural dictation for full description. Right IJ 22 cm 6 Fr Powerline placed, tip at cavoatrial junction.  Complications: None immediate  Estimated Blood Loss: < 5 mL  Recommendations: Catheter ready for immediate use.   Ruthann Cancer, MD

## 2021-07-09 NOTE — Consult Note (Signed)
[place] Nurse Consult Note: Reason for Consult: first post op NPWT dressing change  Wound type: surgical; S/P debridement and amputation of 5th toe Pressure Injury POA: NA Measurement: 6cm x 5cm x 3cm  Wound [id number], pink, moist, some oozing of blood at the medial edge of the wound edge  Drainage (amount, consistency, odor) bloody Periwound: dry skin  Dressing procedure/placement/frequency: Removed old NPWT dressing Filled wound with  _1__ piece of black foam Sealed NPWT dressing at 156m HG Patient received IV pain medication per bedside nurse prior to dressing change Patient tolerated procedure well WSistersvillenurse will continue to provide NPWT dressing changed M/W/F due to the complexity of the dressing change.   [place]on [place]

## 2021-07-09 NOTE — Progress Notes (Signed)
B.P. is 176/90 with Heart rate 66-70 Patient in bed with no complaints. Ivin Booty R.N. aware and text paged Dr. Jeronimo Greaves. Orders to call for SBP > 160.

## 2021-07-09 NOTE — Progress Notes (Signed)
Occupational Therapy Treatment Patient Details Name: Bruce Little MRN: [id number] DOB: 1971/06/30 Today's Date: 07/09/2021    History of present illness Pt is a 50 y/o male who presented with worsening L foot ulceraton found to have necrotizing left foot infection with underlying osteomyelitis and AKI. 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 PMH: DM II, CKD, HTN   OT comments  Pt progressing towards OT goals. Session focused on maneuvering knee scooter in small spaces as half-bath at home similar size to current bathroom in hospital room. Pt did require Min A for mobility using knee scooter w/ assist for wound vac mgmt, cues for L foot safety and techniques. Educated pt on compensatory strategies for LB ADLs, mgmt of smaller wound vac during daily routine and problem solving tub transfer abilities as pt w/ desire for showering tasks eventually at home.   Follow Up Recommendations  No OT follow up;Supervision - Intermittent    Equipment Recommendations  3 in 1 bedside commode;Other (comment) (Rolling walker; plans to obtain knee scooter online)    Recommendations for Other Services      Precautions / Restrictions Precautions Precautions: Fall Precaution Comments: L foot wound vac Restrictions Weight Bearing Restrictions: Yes LLE Weight Bearing: Non weight bearing       Mobility Bed Mobility Overal bed mobility: Modified Independent             General bed mobility comments: HOB elevated    Transfers Overall transfer level: Needs assistance Equipment used:  (knee scooter) Transfers: Sit to/from Stand Sit to Stand: Min guard         General transfer comment: Cues needed and assist to steady knee scooter.    Balance Overall balance assessment: Needs assistance Sitting-balance support: Feet supported Sitting balance-Leahy Scale: Normal     Standing balance support: Bilateral upper extremity supported;During  functional activity Standing balance-Leahy Scale: Poor Standing balance comment: reliant on UE support for mobility due to WB precautions                           ADL either performed or assessed with clinical judgement   ADL Overall ADL's : Needs assistance/impaired                         Toilet Transfer: Minimal assistance;Ambulation Toilet Transfer Details (indicate cue type and reason): with knee scooter; assessed ability to manuever in small spaces , cues needed for technique and wound vac mgmt           General ADL Comments: Educated on mgmt of smaller wound vac for LB ADLs, compensatory strategies (easier clothing to don, threading wound vac through pants and donning affected LE first). Discussed tub transfer plans with pt reporting wife purchased small stool - educated on possible tub transfer strategies without placing weight through LE and initial supervision for safety. Educated on some strategies for knee scooter use in the home, gathering items from kitchen, etc.     Vision   Vision Assessment?: No apparent visual deficits   Perception     Praxis      Cognition Arousal/Alertness: Awake/alert Behavior During Therapy: WFL for tasks assessed/performed Overall Cognitive Status: Within Functional Limits for tasks assessed  General Comments: does need intermittent cues for WB and safety with wound vac        Exercises     Shoulder Instructions       General Comments      Pertinent Vitals/ Pain       Pain Assessment: Faces Faces Pain Scale: Hurts a little bit Pain Location: back Pain Descriptors / Indicators: Discomfort Pain Intervention(s): Monitored during session;Repositioned  Home Living                                          Prior Functioning/Environment              Frequency  Min 2X/week        Progress Toward Goals  OT Goals(current goals can now  be found in the care plan section)  Progress towards OT goals: Progressing toward goals  Acute Rehab OT Goals Patient Stated Goal: to return home OT Goal Formulation: With patient Time For Goal Achievement: 07/21/21 Potential to Achieve Goals: Good ADL Goals Pt Will Perform Grooming: with modified independence;standing Pt Will Perform Lower Body Dressing: with modified independence;sit to/from stand;sitting/lateral leans Pt Will Transfer to Toilet: with modified independence;ambulating Pt Will Perform Toileting - Clothing Manipulation and hygiene: with modified independence;sit to/from stand;sitting/lateral leans  Plan Discharge plan remains appropriate    Co-evaluation                 AM-PAC OT "6 Clicks" Daily Activity     Outcome Measure   Help from another person eating meals?: None Help from another person taking care of personal grooming?: A Little Help from another person toileting, which includes using toliet, bedpan, or urinal?: A Little Help from another person bathing (including washing, rinsing, drying)?: A Little Help from another person to put on and taking off regular upper body clothing?: None Help from another person to put on and taking off regular lower body clothing?: A Little 6 Click Score: 20    End of Session Equipment Utilized During Treatment: Gait belt;Other (comment) (knee scooter)  OT Visit Diagnosis: Unsteadiness on feet (R26.81);Other abnormalities of gait and mobility (R26.89);Pain Pain - Right/Left: Left Pain - part of body: Ankle and joints of foot   Activity Tolerance Patient tolerated treatment well   Patient Left in bed;with call bell/phone within reach   Nurse Communication Mobility status;Weight bearing status (discussed with NT)        Time: [id number] OT Time Calculation (min): 16 min  Charges: OT General Charges $OT Visit: 1 Visit OT Treatments $Self Care/Home Management : 8-22 mins  Bruce Little, OTR/L Acute Rehab  Services Office: [phone]    Bruce Little 07/09/2021, 8:35 AM

## 2021-07-09 NOTE — TOC Progression Note (Signed)
Transition of Care (TOC) - Progression Note  Marvetta Gibbons RN, BSN Transitions of Care Unit 4E- RN Case Manager See Treatment Team for direct phone #    Patient Details  Name: Bruce Little MRN: [id number] Date of Birth: 08-25-1971  Transition of Care [facility]) CM/SW Contact  Dahlia Client, Romeo Rabon, RN Phone Number: 07/09/2021, 12:19 PM  Clinical Narrative:    Have reached out to [facility] this am regarding [place] needs - spoke with [place]. [employer] home wound VAC approval remains in process- hopeful to have approval later today.  1215- received notification from [facility] with [facility] that they are able to accept referral for needed [facility] services- home wound VAC and home IV abx needs. Tentative start of care for Friday 9/2.   Also spoke with Jeannene Patella ([facility] home infusion) she will plan to connect with pt/wife for bedside education regarding iv abx either later today or in the am.   TOC to continue to follow.   Pt will need HHRN orders placed for home wound VAC needs.     Expected Discharge Plan: [place] Barriers to Discharge: Continued Medical Work up  Expected Discharge Plan and Services Expected Discharge Plan: [place]   Discharge Planning Services: CM Consult Post Acute Care Choice: Durable Medical Equipment, Home Health Living arrangements for the past 2 months: Single Family Home                 DME Arranged: Gilford Rile rolling, Vac DME Agency: Rachelle Hora Date DME Agency Contacted: 07/08/21 Time DME Agency Contacted: 1207 Representative spoke with at DME Agency: Olivia Mackie HH Arranged: RN [place] Agency: [place] Date [place]: 07/09/21 Time [place]: 1000 Representative spoke with at [place]: [place] ([place]) Interventions    Readmission Risk Interventions No flowsheet data found.

## 2021-07-09 NOTE — Progress Notes (Signed)
PROGRESS NOTE        PATIENT DETAILS Name: Bruce Little Age: 50 y.o. Sex: male Date of Birth: 05-Feb-1971 Admit Date: 07/01/2021 Admitting Physician Rise Patience, MD [id number], [facility] Family  Brief Narrative: Patient is a 50 y.o. male DM-2, CKD stage V, HTN-who presented with worsening left foot ulceration-found to have necrotizing left foot infection with underlying osteomyelitis and AKI on CKD stage V.  Evaluated by orthopedics-underwent debridement and subsequent left fifth ray amputation-postoperatively-went into A. fib with RVR (briefly).  Orthopedic plans on repeat incision/debridement on 8/29.  See below for further details.  Significant events: 8/23>> admit for left foot necrotizing infection-osteomyelitis-AKI 8/25>> underwent left fifth ray amputation-developed transient A. fib postoperatively requiring Cardizem infusion  Significant studies: 8/24>> MRI left foot: Osteomyelitis fifth metatarsal head-possible osteomyelitis of the fourth metatarsal head cellulitis with air in the forefoot-concern for necrotizing infection. 8/26>> Echo: EF 55-60%.  Antimicrobial therapy: Vancomycin: 8/23>> Zosyn: 8/23>>  Microbiology data: 8/23>> blood cultures: No growth 8/24>> urine culture: No growth 8/25>> Tissue culture (intraoperative): No growth  Procedures : 8/25>> excisional debridement,left foot fifth ray amputation-application of wound VAC.   Consults: Orthopedics, nephrology, cardiology (Patwardhan)  DVT Prophylaxis : SCDs Start: 07/03/21 1540 SCDs Start: 07/02/21 0551 V heparin. apixaban (ELIQUIS) tablet 5 mg   Subjective:  Patient in bed, appears comfortable, denies any headache, no fever, no chest pain or pressure, no shortness of breath , no abdominal pain. No new focal weakness.    Assessment/Plan:  Sepsis due to left foot necrotizing infection with underlying acute osteomyelitis:  Sepsis physiology has resolved, he underwent  fifth ray amputation on 8/25 + I&D again on 8/29 -   Wound VAC has been placed, on IV Ancef - Surgical cultures sent on 8/25 growing multiple organisms including Pasteurella multocida, strep agalactiae, strep group G and rare staph aureus.  Discussed with orthopedic surgeon on 07/09/2019, partial weightbearing on the left leg with normal gait on the left toes, ID consulted, IJ PICC to be placed for IV ABX at home for 2-3 weeks then PO Aumentin.  Case management consulted for arranging home wound VAC and dressing changes.  PAF with RVR on 8/25:  Developed RVR postoperatively on 8/25-briefly required Cardizem infusion.  Maintaining sinus rhythm-currently on metoprolol.  Stable TSH, stable echo with preserved EF-however CHA2DS2-VASc score from 4-after discussion with cardiology-currently on IV heparin with plans to transition to Eliquis on discharge.  Cardiology (Dr. Virgina Jock) will arrange for outpatient follow-up in his office.  Patient stroke advised as well question if he has had paroxysmal A. fib in the past.  Case management to arrange for 1 month Eliquis coupon.  AKI on CKD stage V: It appears that baseline creatinine is between 4.5 and 5.5.  Presented with a creatinine of 6.5.  AKI likely hemodynamically mediated-renal function has improved with IVF and now close to baseline. Recent renal biopsy showed diabetic nephropathy.  He has been seen by nephrology this admission, will need outpatient follow-up with Dr. Joelyn Oms.  Non anion gap metabolic acidosis: Due to CKD-has been started on oral bicarb.  Bicarbonate level is stable.  Continue to monitor.  Hyponatremia: Levels are stable for the most part.  Some fluctuation is noted.  Recent CVA (July 2022): No longer on Plavix- as he is now on anticoagulation.   DM-2 (A1c 8.4 on 8/24): CBGs relatively stable-continue Lantus 10 units-SSI-follow and adjust.  Recent  Labs    07/08/21 1623 07/08/21 2024 07/09/21 0643  GLUCAP 149* 172* 139*    Essential  hypertension: Continue beta-blocker dose increased and added Norvasc for better control.  Normocytic anemia: Due to a combination of CKD stage V and acute illness.  PRBC transfusion on 8/25 while in the operating room. Hemoglobin remains stable.  No evidence of overt blood loss.     Obesity: Estimated body mass index is 28.84 kg/m as calculated from the following:   Height as of this encounter: '6\' 2"'$  (1.88 m).   Weight as of this encounter: 101.9 kg.    Diet: Diet Order             Diet heart healthy/carb modified Room service appropriate? Yes; Fluid consistency: Thin  Diet effective now                    Code Status: Full code  Family Communication: None at bedside.  Disposition Plan: Status is: Inpatient  Remains inpatient appropriate because:Inpatient level of care appropriate due to severity of illness  Dispo: The patient is from: Home              Anticipated d/c is to: Home              Patient currently is not medically stable to d/c.   Difficult to place patient No   Barriers to Discharge: -scheduled to go back to the OR on Monday for repeat I&D.  PT and OT to determine how much assistance he is going to need at discharge.   MEDICATIONS: Scheduled Meds:  amLODipine  10 mg Oral Daily   apixaban  5 mg Oral BID   atorvastatin  80 mg Oral Daily   Chlorhexidine Gluconate Cloth  6 each Topical Daily   fenofibrate  160 mg Oral Daily   insulin aspart  0-6 Units Subcutaneous TID WC   insulin glargine-yfgn  10 Units Subcutaneous Daily   metoprolol tartrate  100 mg Oral BID   metroNIDAZOLE  500 mg Oral Q12H   polyethylene glycol  17 g Oral Daily   sodium bicarbonate  650 mg Oral BID   Continuous Infusions:   ceFAZolin (ANCEF) IV 2 g (07/08/21 2144)   PRN Meds:.acetaminophen **OR** [DISCONTINUED] acetaminophen, fentaNYL (SUBLIMAZE) injection, oxyCODONE   PHYSICAL EXAM: Vital signs: Vitals:   07/08/21 1951 07/08/21 2316 07/09/21 0355 07/09/21 0900  BP:  (!) 176/87 (!) 176/90 (!) 175/88 (!) 186/96  Pulse: 80 75 70 68  Resp: '20 18 17 16  '$ Temp: 97.9 F (36.6 C) 97.9 F (36.6 C) 97.9 F (36.6 C) 97.7 F (36.5 C)  TempSrc: Oral Oral Oral Oral  SpO2: 100% 100% 98% 96%  Weight:   101.9 kg   Height:       Filed Weights   07/03/21 1801 07/08/21 0538 07/09/21 0355  Weight: 106.3 kg 100.6 kg 101.9 kg   Body mass index is 28.84 kg/m.    Exam   Awake Alert, No new F.N deficits, Normal affect Lorenzo.AT,PERRAL Supple Neck,No JVD, No cervical lymphadenopathy appriciated.  Symmetrical Chest wall movement, Good air movement bilaterally, CTAB RRR,No Gallops, Rubs or new Murmurs, No Parasternal Heave +ve B.Sounds, Abd Soft, No tenderness, No organomegaly appriciated, No rebound - guarding or rigidity. L.foot 5th Ray amputation with W Vac    I have personally reviewed following labs and imaging studies  LABORATORY DATA: CBC: Recent Labs  Lab 07/05/21 0028 07/06/21 0606 07/07/21 0632 07/08/21 0115 07/09/21 0146  WBC  18.8* 16.4* 14.9* 14.0* 12.3*  NEUTROABS  --   --   --   --  9.5*  HGB 9.0* 8.9* 9.2* 8.5* 9.2*  HCT 26.7* 27.1* 27.4* 25.4* 27.2*  MCV 84.2 86.0 84.8 85.2 84.7  PLT 385 379 404* 379 [id number]    Basic Metabolic Panel: Recent Labs  Lab 07/03/21 0024 07/03/21 1240 07/04/21 0601 07/05/21 0028 07/06/21 0606 07/07/21 0632 07/08/21 0115 07/09/21 0146  NA 127*   < > 130* 129* 129* 131* 132* 131*  K 3.8   < > 3.9 4.2 4.0 4.2 4.4 4.0  CL 100   < > 102 103 103 105 105 104  CO2 17*  --  15* 16* 17* 17* 18* 17*  GLUCOSE 147*   < > 147* 179* 141* 214* 136* 176*  BUN 58*   < > 54* 57* 59* 66* 62* 58*  CREATININE 6.11*   < > 5.70* 5.47* 5.48* 5.42* 5.33* 5.10*  CALCIUM 8.3*  --  8.9 8.4* 8.5* 8.5* 8.5* 8.6*  MG 1.7  --   --   --   --   --   --  2.1  PHOS  --   --  5.2*  --   --   --   --   --    < > = values in this interval not displayed.    GFR: Estimated Creatinine Clearance: 22.3 mL/min (A) (by C-G formula based on SCr  of 5.1 mg/dL (H)).  Liver Function Tests: Recent Labs  Lab 07/03/21 0024 07/04/21 0601 07/09/21 0146  AST 85*  --  23  ALT 72*  --  17  ALKPHOS 76  --  77  BILITOT 0.7  --  0.3  PROT 6.4*  --  6.5  ALBUMIN 1.9* 2.0* 1.8*     Coagulation Profile: No results for input(s): INR, PROTIME in the last 168 hours.   Thyroid Function Tests: No results for input(s): TSH, T4TOTAL, FREET4, T3FREE, THYROIDAB in the last 72 hours.   Urine analysis:    Component Value Date/Time   COLORURINE YELLOW 07/01/2021 2036   APPEARANCEUR HAZY (A) 07/01/2021 2036   LABSPEC 1.016 07/01/2021 2036   PHURINE 5.0 07/01/2021 2036   GLUCOSEU 150 (A) 07/01/2021 2036   HGBUR MODERATE (A) 07/01/2021 2036   BILIRUBINUR NEGATIVE 07/01/2021 2036   KETONESUR NEGATIVE 07/01/2021 2036   PROTEINUR 100 (A) 07/01/2021 2036   NITRITE NEGATIVE 07/01/2021 2036   LEUKOCYTESUR NEGATIVE 07/01/2021 2036    Sepsis Labs: Lactic Acid, Venous    Component Value Date/Time   LATICACIDVEN 0.7 07/02/2021 0616     RADIOLOGY STUDIES/RESULTS: No results found.   LOS: 7 days   Signature  Lala Lund M.D on 07/09/2021 at 10:27 AM   -  To page go to [email or web address]

## 2021-07-09 NOTE — Progress Notes (Signed)
   Patient Status: [facility] - In-pt  Assessment and Plan: Patient in need of venous access.   Tunneled central catheter placement  ______________________________________________________________________   History of Present Illness: Bruce Little is a 50 y.o. male   Osteomyelitis Left foot; 5th metatarsal Need for weeks of IV antibiotics at home CKD stage 5 Preserving arm veins Request made for tunneled central catheter placement in IR  Allergies and medications reviewed.   Review of Systems: A 12 point ROS discussed and pertinent positives are indicated in the HPI above.  All other systems are negative.    Vital Signs: BP (!) 175/88 (BP Location: Left Arm)   Pulse 70   Temp 97.9 F (36.6 C) (Oral)   Resp 17   Ht '6\' 2"'$  (1.88 m)   Wt 224 lb 10.4 oz (101.9 kg)   SpO2 98%   BMI 28.84 kg/m   Physical Exam HENT:     Mouth/Throat:     Mouth: Mucous membranes are moist.  Cardiovascular:     Rate and Rhythm: Normal rate and regular rhythm.  Pulmonary:     Breath sounds: Normal breath sounds.  Skin:    General: Skin is warm.  Neurological:     Mental Status: He is alert and oriented to person, place, and time.  Psychiatric:        Behavior: Behavior normal.     Imaging reviewed.   Labs:  COAGS: Recent Labs    05/19/21 1950 06/04/21 0702 07/01/21 2055  INR 0.9 1.0 1.5*  APTT 25  --  34    BMP: Recent Labs    07/06/21 0606 07/07/21 0632 07/08/21 0115 07/09/21 0146  NA 129* 131* 132* 131*  K 4.0 4.2 4.4 4.0  CL 103 105 105 104  CO2 17* 17* 18* 17*  GLUCOSE 141* 214* 136* 176*  BUN 59* 66* 62* 58*  CALCIUM 8.5* 8.5* 8.5* 8.6*  CREATININE 5.48* 5.42* 5.33* 5.10*  GFRNONAA 12* 12* 12* 13*    Scheduled for tunneled central catheter placement Pt is aware of procedure benefits and risks- including but not limited to Infection; vessel damage; bleeding Agreeable to proceed Consent signed and in chart   Electronically Signed: Lavonia Drafts,  PA-C 07/09/2021, 8:14 AM   I spent a total of 15 minutes in face to face in clinical consultation, greater than 50% of which was counseling/coordinating care for venous access.

## 2021-07-09 NOTE — Progress Notes (Signed)
Patient with recent wound vac change to left foot by WOC RN, wound vac alarming with blockage alert and seal alert.  appearance of wound bleeding with appearance of clots in canister. Canister changed with out success of resolving blockage alert. Wound vac dressing changed, noticed bleeding at the wound base. . At this time seal intact. Bedside RN in room and aware. Eller Sweis, Bettina Gavia RN

## 2021-07-09 NOTE — Progress Notes (Signed)
PT Cancellation Note  Patient Details Name: Bruce Little MRN: [id number] DOB: 17-Mar-1971   Cancelled Treatment:    Reason Eval/Treat Not Completed: Medical issues which prohibited therapy  Spoke with RN and pt's surgical site bleeding and wound vac not working.  Not stable to participate with therapy due to bleeding.   Abran Richard, PT Acute Rehab Services Pager [phone] [facility] [place] 07/09/2021, 4:57 PM

## 2021-07-10 DIAGNOSIS — G5601 Carpal tunnel syndrome, right upper limb: Secondary | ICD-10-CM

## 2021-07-10 LAB — CBC WITH DIFFERENTIAL/PLATELET
Abs Immature Granulocytes: 0.18 10*3/uL — ABNORMAL HIGH (ref 0.00–0.07)
Basophils Absolute: 0.1 10*3/uL (ref 0.0–0.1)
Basophils Relative: 0 %
Eosinophils Absolute: 0.3 10*3/uL (ref 0.0–0.5)
Eosinophils Relative: 3 %
HCT: 27.8 % — ABNORMAL LOW (ref 39.0–52.0)
Hemoglobin: 9.1 g/dL — ABNORMAL LOW (ref 13.0–17.0)
Immature Granulocytes: 2 %
Lymphocytes Relative: 13 %
Lymphs Abs: 1.4 10*3/uL (ref 0.7–4.0)
MCH: 28 pg (ref 26.0–34.0)
MCHC: 32.7 g/dL (ref 30.0–36.0)
MCV: 85.5 fL (ref 80.0–100.0)
Monocytes Absolute: 0.7 10*3/uL (ref 0.1–1.0)
Monocytes Relative: 6 %
Neutro Abs: 8.6 10*3/uL — ABNORMAL HIGH (ref 1.7–7.7)
Neutrophils Relative %: 76 %
Platelets: 404 10*3/uL — ABNORMAL HIGH (ref 150–400)
RBC: 3.25 MIL/uL — ABNORMAL LOW (ref 4.22–5.81)
RDW: 13.4 % (ref 11.5–15.5)
WBC: 11.2 10*3/uL — ABNORMAL HIGH (ref 4.0–10.5)
nRBC: 0 % (ref 0.0–0.2)

## 2021-07-10 LAB — COMPREHENSIVE METABOLIC PANEL
ALT: 9 U/L (ref 0–44)
AST: 20 U/L (ref 15–41)
Albumin: 1.9 g/dL — ABNORMAL LOW (ref 3.5–5.0)
Alkaline Phosphatase: 78 U/L (ref 38–126)
Anion gap: 10 (ref 5–15)
BUN: 53 mg/dL — ABNORMAL HIGH (ref 6–20)
CO2: 19 mmol/L — ABNORMAL LOW (ref 22–32)
Calcium: 8.7 mg/dL — ABNORMAL LOW (ref 8.9–10.3)
Chloride: 104 mmol/L (ref 98–111)
Creatinine, Ser: 4.77 mg/dL — ABNORMAL HIGH (ref 0.61–1.24)
GFR, Estimated: 14 mL/min — ABNORMAL LOW (ref >60)
Glucose, Bld: 197 mg/dL — ABNORMAL HIGH (ref 70–99)
Potassium: 3.9 mmol/L (ref 3.5–5.1)
Sodium: 133 mmol/L — ABNORMAL LOW (ref 135–145)
Total Bilirubin: 0.6 mg/dL (ref 0.3–1.2)
Total Protein: 6.7 g/dL (ref 6.5–8.1)

## 2021-07-10 LAB — GLUCOSE, CAPILLARY
Glucose-Capillary: 166 mg/dL — ABNORMAL HIGH (ref 70–99)
Glucose-Capillary: 174 mg/dL — ABNORMAL HIGH (ref 70–99)
Glucose-Capillary: 198 mg/dL — ABNORMAL HIGH (ref 70–99)
Glucose-Capillary: 179 mg/dL — ABNORMAL HIGH (ref 70–99)

## 2021-07-10 LAB — MAGNESIUM: Magnesium: 2.1 mg/dL (ref 1.7–2.4)

## 2021-07-10 MED ORDER — HYDRALAZINE HCL 25 MG PO TABS
25.0000 mg | ORAL_TABLET | Freq: Three times a day (TID) | ORAL | Status: DC
  Administered 2021-07-10 – 2021-07-11 (×4): 25 mg via ORAL
  Filled 2021-07-10 (×4): qty 1

## 2021-07-10 NOTE — Progress Notes (Signed)
OT Cancellation Note  Patient Details Name: Bruce Little MRN: [id number] DOB: 1971/09/05   Cancelled Treatment:    Reason Eval/Treat Not Completed: Patient declined, was sleeping and family asked for OT to return at a later time. OT will re-attempt as schedule allows, and continue to follow acutely. Noted discharge plans for tomorrow. Will communicate with PT  Merri Ray Kien Mirsky 07/10/2021, 12:48 PM  Jesse Sans OTR/L Acute Rehabilitation Services Pager: [phone] Office: [phone]

## 2021-07-10 NOTE — Progress Notes (Signed)
Physical Therapy Treatment Patient Details Name: Bruce Little MRN: [id number] DOB: [birth year] Today's Date: 07/10/2021    History of Present Illness Pt is a 50 y/o male who presented with worsening L foot ulceraton found to have necrotizing left foot infection with underlying osteomyelitis and AKI. 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 PMH: DM II, CKD, HTN    PT Comments    Pt in good spirits this afternoon, reports anticipating d/c home tomorrow. Pt declining OOB mobility at this time, but was educated at length on transfers to include car transfers, stair navigation, and use of knee scooter. The pt was agreeable to all education. The pt was also instructed on exercises to improve strength in RLE and general tolerance for OOB mobility, to recover muscle mass lost during hospitalization. Pt feels confident with mobility and voices understanding of all education regarding mobility at home.    Follow Up Recommendations  No PT follow up;Supervision for mobility/OOB;Supervision - Intermittent     Equipment Recommendations  Rolling walker with 5" wheels;Other (comment) (knee scooter)    Recommendations for Other Services       Precautions / Restrictions Precautions Precautions: Fall Precaution Comments: L foot wound vac Restrictions Weight Bearing Restrictions: Yes LLE Weight Bearing: Non weight bearing    Mobility  Bed Mobility Overal bed mobility: Modified Independent             General bed mobility comments: pt moving in bed without assist    Transfers                 General transfer comment: pt declined OOB mobility at this time stating he would like to rest prior to d/c home. session spent discussing technique for use of DME at home         Cognition Arousal/Alertness: Awake/alert Behavior During Therapy: [facility] [place] for tasks assessed/performed Overall Cognitive Status: Within Functional Limits for tasks  assessed                                 General Comments: pt demos good safety awarenes, planning for d/c home      Exercises      General Comments General comments (skin integrity, edema, etc.): VSS on RA      Pertinent Vitals/Pain Pain Assessment: Faces Faces Pain Scale: Hurts a little bit Pain Location: back Pain Descriptors / Indicators: Discomfort Pain Intervention(s): Limited activity within patient's tolerance;Monitored during session     PT Goals (current goals can now be found in the care plan section) Acute Rehab PT Goals Patient Stated Goal: to return home PT Goal Formulation: With patient/family Time For Goal Achievement: 07/15/21 Potential to Achieve Goals: Good Progress towards PT goals: Progressing toward goals    Frequency    Min 3X/week      PT Plan Current plan remains appropriate       AM-PAC PT "6 Clicks" Mobility   Outcome Measure  Help needed turning from your back to your side while in a flat bed without using bedrails?: None Help needed moving from lying on your back to sitting on the side of a flat bed without using bedrails?: None Help needed moving to and from a bed to a chair (including a wheelchair)?: A Little Help needed standing up from a chair using your arms (e.g., wheelchair or bedside chair)?: A Little Help needed to walk in hospital room?:  A Little Help needed climbing 3-5 steps with a railing? : A Lot 6 Click Score: 19    End of Session   Activity Tolerance: Patient tolerated treatment well Patient left: in bed;with call bell/phone within reach Nurse Communication: Mobility status PT Visit Diagnosis: Unsteadiness on feet (R26.81);Other abnormalities of gait and mobility (R26.89);Pain;Difficulty in walking, not elsewhere classified (R26.2) Pain - Right/Left: Left Pain - part of body: Ankle and joints of foot     Time: 1529-1540 PT Time Calculation (min) (ACUTE ONLY): 11 min  Charges:  $Self Care/Home  Management: 8-22                     West Carbo, PT, DPT   Acute Rehabilitation Department Pager #: [phone]   Sandra Cockayne 07/10/2021, 3:50 PM

## 2021-07-10 NOTE — Progress Notes (Signed)
I examined the wound and it is hemostatic.  Will go ahead and restart wound VAC to the left foot.  He will need to go home with home wound vac and wound nurse to change every MWF.  Follow up with me in 2 weeks in the office.   Azucena Cecil, MD [facility] 8:17 AM

## 2021-07-10 NOTE — Progress Notes (Signed)
PROGRESS NOTE        PATIENT DETAILS Name: Bruce Little Age: 50 y.o. Sex: male Date of Birth: 05-05-1971 Admit Date: 07/01/2021 Admitting Physician Rise Patience, MD BCW:[id number], [facility]  Brief Narrative: Patient is a 50 y.o. male DM-2, CKD stage V, HTN-who presented with worsening left foot ulceration-found to have necrotizing left foot infection with underlying osteomyelitis and AKI on CKD stage V.  Evaluated by orthopedics-underwent debridement and subsequent left fifth ray amputation-postoperatively-went into A. fib with RVR (briefly).  Orthopedic plans on repeat incision/debridement on 8/29.  See below for further details.  Significant events: 8/23>> admit for left foot necrotizing infection-osteomyelitis-AKI 8/25>> underwent left fifth ray amputation-developed transient A. fib postoperatively requiring Cardizem infusion  Significant studies: 8/24>> MRI left foot: Osteomyelitis fifth metatarsal head-possible osteomyelitis of the fourth metatarsal head cellulitis with air in the forefoot-concern for necrotizing infection. 8/26>> Echo: EF 55-60%.  Antimicrobial therapy: Vancomycin: 8/23>> Zosyn: 8/23>>  Microbiology data: 8/23>> blood cultures: No growth 8/24>> urine culture: No growth 8/25>> Tissue culture (intraoperative): No growth  Procedures : 8/25>> excisional debridement,left foot fifth ray amputation-application of wound VAC.   Consults: Orthopedics, nephrology, cardiology (Patwardhan)  DVT Prophylaxis : SCDs Start: 07/03/21 1540 SCDs Start: 07/02/21 0551 V heparin.   Subjective:  Patient in bed, appears comfortable, denies any headache, no fever, no chest pain or pressure, no shortness of breath , no abdominal pain. No new focal weakness.   Assessment/Plan:  Sepsis due to left foot necrotizing infection with underlying acute osteomyelitis:  Sepsis physiology has resolved, he underwent fifth ray amputation on 8/25 +  I&D again on 8/29 -   Wound VAC has been placed, on IV Ancef - Surgical cultures sent on 8/25 growing multiple organisms including Pasteurella multocida, strep agalactiae, strep group G and rare staph aureus.  Discussed with orthopedic surgeon on 07/09/2019, partial weightbearing on the left leg with normal gait on the left toes, ID consulted, IJ PICC to be placed for IV ABX at home for 2-3 weeks then PO Aumentin.  Case management consulted for arranging home wound VAC and dressing changes.  Note patient had some bleeding from the wound site on 07/09/2021, wound VAC was discontinued for 12 hours, bleeding seems to have subsided, have discussed with orthopedics, wound VAC to be resumed today if no further bleeding then likely discharge home on 07/11/2021.  PAF with RVR on 8/25:  Developed RVR postoperatively on 8/25-briefly required Cardizem infusion.  Maintaining sinus rhythm-currently on metoprolol.  Stable TSH, stable echo with preserved EF-however CHA2DS2-VASc score from 4-after discussion with cardiology-currently on IV heparin with plans to transition to Eliquis on discharge.  Cardiology (Dr. Virgina Jock) will arrange for outpatient follow-up in his office.  Patient stroke advised as well question if he has had paroxysmal A. fib in the past.  Case management to arrange for 1 month Eliquis coupon.  AKI on CKD stage V: It appears that baseline creatinine is between 4.5 and 5.5.  Presented with a creatinine of 6.5.  AKI likely hemodynamically mediated-renal function has improved with IVF and now close to baseline. Recent renal biopsy showed diabetic nephropathy.  He has been seen by nephrology this admission, will need outpatient follow-up with Dr. Joelyn Oms.  Non anion gap metabolic acidosis: Due to CKD-has been started on oral bicarb.  Bicarbonate level is stable.  Continue to monitor.  Hyponatremia: Levels are stable for the most  part.  Some fluctuation is noted.  Recent CVA (July 2022): No longer on Plavix-  as he is now on anticoagulation.   DM-2 (A1c 8.4 on 8/24): CBGs relatively stable-continue Lantus 10 units-SSI-follow and adjust.  Recent Labs    07/09/21 1643 07/09/21 2051 07/10/21 0550  GLUCAP 144* 132* 166*    Essential hypertension: Continue beta-blocker dose increased and added Norvasc for better control.  Normocytic anemia: Due to a combination of CKD stage V and acute illness.  PRBC transfusion on 8/25 while in the operating room. Hemoglobin remains stable.  No evidence of overt blood loss.     Obesity: Estimated body mass index is 28.79 kg/m as calculated from the following:   Height as of this encounter: _0  (1.88 m).   Weight as of this encounter: 101.7 kg.    Diet: Diet Order             Diet heart healthy/carb modified Room service appropriate? Yes; Fluid consistency: Thin  Diet effective now                    Code Status: Full code  Family Communication: None at bedside.  Disposition Plan: Status is: Inpatient  Remains inpatient appropriate because:Inpatient level of care appropriate due to severity of illness  Dispo: The patient is from: Home              Anticipated d/c is to: Home              Patient currently is not medically stable to d/c.   Difficult to place patient No   Barriers to Discharge: -scheduled to go back to the OR on Monday for repeat I&D.  PT and OT to determine how much assistance he is going to need at discharge.   MEDICATIONS: Scheduled Meds:  amLODipine  10 mg Oral Daily   atorvastatin  80 mg Oral Daily   Chlorhexidine Gluconate Cloth  6 each Topical Daily   fenofibrate  160 mg Oral Daily   hydrALAZINE  25 mg Oral Q8H   insulin aspart  0-6 Units Subcutaneous TID WC   insulin glargine-yfgn  10 Units Subcutaneous Daily   metoprolol tartrate  100 mg Oral BID   metroNIDAZOLE  500 mg Oral Q12H   polyethylene glycol  17 g Oral Daily   sodium bicarbonate  650 mg Oral BID   Continuous Infusions:   ceFAZolin  (ANCEF) IV 2 g (07/10/21 0941)   PRN Meds:.acetaminophen **OR** [DISCONTINUED] acetaminophen, fentaNYL (SUBLIMAZE) injection, oxyCODONE   PHYSICAL EXAM: Vital signs: Vitals:   07/09/21 1950 07/09/21 2348 07/10/21 0405 07/10/21 0839  BP: (!) 167/90 (!) 163/88 (!) 163/82 (!) 178/88  Pulse: 75 67 62 66  Resp: _1 Temp: 97.8 F (36.6 C) 98.5 F (36.9 C) 98.5 F (36.9 C) 97.9 F (36.6 C)  TempSrc: Oral Oral Oral Oral  SpO2: 100% 100% 99% 100%  Weight:   101.7 kg   Height:       Filed Weights   07/08/21 0538 07/09/21 0355 07/10/21 0405  Weight: 100.6 kg 101.9 kg 101.7 kg   Body mass index is 28.79 kg/m.    Exam   Awake Alert, No new F.N deficits, Normal affect [place].AT,PERRAL Supple Neck,No JVD, No cervical lymphadenopathy appriciated.  Symmetrical Chest wall movement, Good air movement bilaterally, CTAB RRR,No Gallops, Rubs or new Murmurs, No Parasternal Heave +ve B.Sounds, Abd Soft, No tenderness, No organomegaly appriciated, No rebound - guarding or rigidity. L.foot  5th Ray amputation with bandage, R IJ PICC    I have personally reviewed following labs and imaging studies  LABORATORY DATA: CBC: Recent Labs  Lab 07/06/21 0606 07/07/21 0632 07/08/21 0115 07/09/21 0146 07/10/21 0121  WBC 16.4* 14.9* 14.0* 12.3* 11.2*  NEUTROABS  --   --   --  9.5* 8.6*  HGB 8.9* 9.2* 8.5* 9.2* 9.1*  HCT 27.1* 27.4* 25.4* 27.2* 27.8*  MCV 86.0 84.8 85.2 84.7 85.5  PLT 379 404* 379 379 404*    Basic Metabolic Panel: Recent Labs  Lab 07/04/21 0601 07/05/21 0028 07/06/21 0606 07/07/21 7741 07/08/21 0115 07/09/21 0146 07/10/21 0121  NA 130*   < > 129* 131* 132* 131* 133*  K 3.9   < > 4.0 4.2 4.4 4.0 3.9  CL 102   < > 103 105 105 104 104  CO2 15*   < > 17* 17* 18* 17* 19*  GLUCOSE 147*   < > 141* 214* 136* 176* 197*  BUN 54*   < > 59* 66* 62* 58* 53*  CREATININE 5.70*   < > 5.48* 5.42* 5.33* 5.10* 4.77*  CALCIUM 8.9   < > 8.5* 8.5* 8.5* 8.6* 8.7*  MG  --   --    --   --   --  2.1 2.1  PHOS 5.2*  --   --   --   --   --   --    < > = values in this interval not displayed.    GFR: Estimated Creatinine Clearance: 23.8 mL/min (A) (by C-G formula based on SCr of 4.77 mg/dL (H)).  Liver Function Tests: Recent Labs  Lab 07/04/21 0601 07/09/21 0146 07/10/21 0121  AST  --  23 20  ALT  --  17 9  ALKPHOS  --  77 78  BILITOT  --  0.3 0.6  PROT  --  6.5 6.7  ALBUMIN 2.0* 1.8* 1.9*     Coagulation Profile: No results for input(s): INR, PROTIME in the last 168 hours.   Thyroid Function Tests: No results for input(s): TSH, T4TOTAL, FREET4, T3FREE, THYROIDAB in the last 72 hours.   Urine analysis:    Component Value Date/Time   COLORURINE YELLOW 07/01/2021 2036   APPEARANCEUR HAZY (A) 07/01/2021 2036   LABSPEC 1.016 07/01/2021 2036   PHURINE 5.0 07/01/2021 2036   GLUCOSEU 150 (A) 07/01/2021 2036   HGBUR MODERATE (A) 07/01/2021 2036   BILIRUBINUR NEGATIVE 07/01/2021 2036   KETONESUR NEGATIVE 07/01/2021 2036   PROTEINUR 100 (A) 07/01/2021 2036   NITRITE NEGATIVE 07/01/2021 2036   LEUKOCYTESUR NEGATIVE 07/01/2021 2036    Sepsis Labs: Lactic Acid, Venous    Component Value Date/Time   LATICACIDVEN 0.7 07/02/2021 4239     RADIOLOGY STUDIES/RESULTS: IR Fluoro Guide CV Line Right  Result Date: 07/09/2021 INDICATION: 50 year old male with history of CKD5 and lower extremity osteomyelitis requiring central venous access for long term antibiotiocs. EXAM: 1. Ultrasound-guided venipuncture of the jugular vein 2. Fluoroscopic guided placement of tunneled central venous catheter MEDICATIONS: None. ANESTHESIA/SEDATION: Local anesthesia only. FLUOROSCOPY TIME:  0 minutes 6 seconds (3 mGy). COMPLICATIONS: None immediate. PROCEDURE: Informed written consent was obtained from the patient after a discussion of the risks, benefits, and alternatives to treatment. Questions regarding the procedure were encouraged and answered. The right neck and chest were  prepped with chlorhexidine in a sterile fashion, and a sterile drape was applied covering the operative field. Maximum barrier sterile technique with sterile gowns and  gloves were used for the procedure. A timeout was performed prior to the initiation of the procedure. After creating a small venotomy incision, a 21 gauge micropuncture kit was utilized to access the internal jugular vein. Real-time ultrasound guidance was utilized for vascular access including the acquisition of a permanent ultrasound image documenting patency of the accessed vessel. A Mandril wire to the level of the cavoatrial junction. A 6 Fr tunneled central venous catheter measuring 22 cm from tip to cuff was tunneled in a retrograde fashion from the anterior chest wall to the venotomy incision. A peel-away sheath was placed over the wire. The catheter was then placed through the peel-away sheath with the catheter tip ultimately positioned at the cavoatrial junction. Final catheter positioning was confirmed and documented with a spot radiographic image. The catheter aspirates and flushes normally. The catheter was flushed with appropriate volume heparin dwells. The catheter exit site was secured with a 2-0 Ethilon retention suture. The venotomy incision was closed with Dermabond. Sterile dressings were applied. The patient tolerated the procedure well without immediate post procedural complication. IMPRESSION: Successful placement of 22 cm tip to cuff tunneled central venous catheter via the right internal jugular vein with catheter tip terminating at the cavoatrial junction. The catheter is ready for immediate use. Ruthann Cancer, MD [facility] Radiology Electronically Signed   By: Ruthann Cancer M.D.   On: 07/09/2021 11:28   IR US Guide Vasc Access Right  Result Date: 07/09/2021 INDICATION: 50 year old male with history of CKD5 and lower extremity osteomyelitis requiring central venous access  for long term antibiotiocs. EXAM: 1. Ultrasound-guided venipuncture of the jugular vein 2. Fluoroscopic guided placement of tunneled central venous catheter MEDICATIONS: None. ANESTHESIA/SEDATION: Local anesthesia only. FLUOROSCOPY TIME:  0 minutes 6 seconds (3 mGy). COMPLICATIONS: None immediate. PROCEDURE: Informed written consent was obtained from the patient after a discussion of the risks, benefits, and alternatives to treatment. Questions regarding the procedure were encouraged and answered. The right neck and chest were prepped with chlorhexidine in a sterile fashion, and a sterile drape was applied covering the operative field. Maximum barrier sterile technique with sterile gowns and gloves were used for the procedure. A timeout was performed prior to the initiation of the procedure. After creating a small venotomy incision, a 21 gauge micropuncture kit was utilized to access the internal jugular vein. Real-time ultrasound guidance was utilized for vascular access including the acquisition of a permanent ultrasound image documenting patency of the accessed vessel. A Mandril wire to the level of the cavoatrial junction. A 6 Fr tunneled central venous catheter measuring 22 cm from tip to cuff was tunneled in a retrograde fashion from the anterior chest wall to the venotomy incision. A peel-away sheath was placed over the wire. The catheter was then placed through the peel-away sheath with the catheter tip ultimately positioned at the cavoatrial junction. Final catheter positioning was confirmed and documented with a spot radiographic image. The catheter aspirates and flushes normally. The catheter was flushed with appropriate volume heparin dwells. The catheter exit site was secured with a 2-0 Ethilon retention suture. The venotomy incision was closed with Dermabond. Sterile dressings were applied. The patient tolerated the procedure well without immediate post procedural complication. IMPRESSION: Successful  placement of 22 cm tip to cuff tunneled central venous catheter via the right internal jugular vein with catheter tip terminating at the cavoatrial junction. The catheter is ready for immediate use. Ruthann Cancer, MD [facility]  Radiology Electronically Signed   By: Ruthann Cancer M.D.   On: 07/09/2021 11:28     LOS: 8 days   Signature  Lala Lund M.D on 07/10/2021 at 12:08 PM   -  To page go to [email or web address]

## 2021-07-10 NOTE — Consult Note (Addendum)
WOC reviewed chart from notes from afternoon 07/09/21.  [place] nurse will sign off at this time bc topical care is ordered per orthopedics.  Bedside nurse can perform wound care.  If NPWT desired would need new consult.   Re consult if needed, will not follow at this time. Thanks  Enos Muhl [employer], RN,CWOCN, CNS, [place] [phone])

## 2021-07-10 NOTE — Consult Note (Signed)
WOC Nurse Consult Note: Reason for Consult: re-application of NPWT dressing  Wound type: surgical; s/p amputation Pressure Injury POA: NA Measurement: see WOC notes 07/09/21 Wound [id number], no active bleeding, fibrinous, small clot centrally  Drainage (amount, consistency, odor) none Periwound: intact; large callous along the plantar surface at wound edge; will leave intact at this point. Dr. Erlinda Hong can address in his office  Dressing procedure/placement/frequency: Removed previous dressing; alginate dry; no active bleeding Apply 1pc of Mepitel to the wound bed to protect area from aggressive suction Cut to fit 1pc of black foam and placed over Mepitel.  Used 1/4 pc. Of ostomy barrier ring around the 4th toe to aid in seal Sealed at 169mHG, patient tolerated well Secured dressing and tubing with ACE wrap.  [facility].  CM and I discussed that [facility] would need to change tomorrow and HHRN to begin on Monday for dressing changes.   MSan Bernardino [place] CRadnor

## 2021-07-11 DIAGNOSIS — G5601 Carpal tunnel syndrome, right upper limb: Secondary | ICD-10-CM

## 2021-07-11 LAB — COMPREHENSIVE METABOLIC PANEL
ALT: 5 U/L (ref 0–44)
AST: 19 U/L (ref 15–41)
Albumin: 2 g/dL — ABNORMAL LOW (ref 3.5–5.0)
Alkaline Phosphatase: 71 U/L (ref 38–126)
Anion gap: 8 (ref 5–15)
BUN: 49 mg/dL — ABNORMAL HIGH (ref 6–20)
CO2: 19 mmol/L — ABNORMAL LOW (ref 22–32)
Calcium: 8.5 mg/dL — ABNORMAL LOW (ref 8.9–10.3)
Chloride: 102 mmol/L (ref 98–111)
Creatinine, Ser: 4.44 mg/dL — ABNORMAL HIGH (ref 0.61–1.24)
GFR, Estimated: 15 mL/min — ABNORMAL LOW (ref >60)
Glucose, Bld: 154 mg/dL — ABNORMAL HIGH (ref 70–99)
Potassium: 3.9 mmol/L (ref 3.5–5.1)
Sodium: 129 mmol/L — ABNORMAL LOW (ref 135–145)
Total Bilirubin: 0.5 mg/dL (ref 0.3–1.2)
Total Protein: 6.6 g/dL (ref 6.5–8.1)

## 2021-07-11 LAB — CBC WITH DIFFERENTIAL/PLATELET
Abs Immature Granulocytes: 0.14 10*3/uL — ABNORMAL HIGH (ref 0.00–0.07)
Basophils Absolute: 0.1 10*3/uL (ref 0.0–0.1)
Basophils Relative: 0 %
Eosinophils Absolute: 0.4 10*3/uL (ref 0.0–0.5)
Eosinophils Relative: 3 %
HCT: 28.5 % — ABNORMAL LOW (ref 39.0–52.0)
Hemoglobin: 9.3 g/dL — ABNORMAL LOW (ref 13.0–17.0)
Immature Granulocytes: 1 %
Lymphocytes Relative: 13 %
Lymphs Abs: 1.8 10*3/uL (ref 0.7–4.0)
MCH: 28.4 pg (ref 26.0–34.0)
MCHC: 32.6 g/dL (ref 30.0–36.0)
MCV: 86.9 fL (ref 80.0–100.0)
Monocytes Absolute: 0.8 10*3/uL (ref 0.1–1.0)
Monocytes Relative: 5 %
Neutro Abs: 11 10*3/uL — ABNORMAL HIGH (ref 1.7–7.7)
Neutrophils Relative %: 78 %
Platelets: 434 10*3/uL — ABNORMAL HIGH (ref 150–400)
RBC: 3.28 MIL/uL — ABNORMAL LOW (ref 4.22–5.81)
RDW: 13.4 % (ref 11.5–15.5)
WBC: 14.1 10*3/uL — ABNORMAL HIGH (ref 4.0–10.5)
nRBC: 0.1 % (ref 0.0–0.2)

## 2021-07-11 LAB — GLUCOSE, CAPILLARY: Glucose-Capillary: 135 mg/dL — ABNORMAL HIGH (ref 70–99)

## 2021-07-11 LAB — MAGNESIUM: Magnesium: 2.1 mg/dL (ref 1.7–2.4)

## 2021-07-11 MED ORDER — METOPROLOL TARTRATE 100 MG PO TABS: 100.0000 mg | ORAL_TABLET | Freq: Two times a day (BID) | ORAL | 0 refills | Status: DC

## 2021-07-11 MED ORDER — OXYCODONE HCL 5 MG PO TABS: 5.0000 mg | ORAL_TABLET | Freq: Four times a day (QID) | ORAL | 0 refills | Status: DC | PRN

## 2021-07-11 MED ORDER — CEFAZOLIN IV (FOR PTA / DISCHARGE USE ONLY): 2.0000 g | Freq: Two times a day (BID) | INTRAVENOUS | 0 refills | Status: AC

## 2021-07-11 MED ORDER — APIXABAN 5 MG PO TABS: 5.0000 mg | ORAL_TABLET | Freq: Two times a day (BID) | ORAL | 0 refills | Status: DC

## 2021-07-11 MED ORDER — METRONIDAZOLE 500 MG PO TABS: 500.0000 mg | ORAL_TABLET | Freq: Two times a day (BID) | ORAL | 0 refills | Status: DC

## 2021-07-11 MED ORDER — SODIUM BICARBONATE 650 MG PO TABS: 650.0000 mg | ORAL_TABLET | Freq: Two times a day (BID) | ORAL | 0 refills | Status: DC

## 2021-07-11 MED ORDER — FUROSEMIDE 40 MG PO TABS
40.0000 mg | ORAL_TABLET | Freq: Once | ORAL | Status: AC
  Administered 2021-07-11: 40 mg via ORAL
  Filled 2021-07-11: qty 1

## 2021-07-11 MED ORDER — APIXABAN 5 MG PO TABS
5.0000 mg | ORAL_TABLET | Freq: Two times a day (BID) | ORAL | Status: DC
  Administered 2021-07-11: 5 mg via ORAL
  Filled 2021-07-11: qty 1

## 2021-07-11 MED ORDER — CEFAZOLIN IV (FOR PTA / DISCHARGE USE ONLY): 2.0000 g | Freq: Two times a day (BID) | INTRAVENOUS | 0 refills | Status: DC

## 2021-07-11 MED ORDER — HYDRALAZINE HCL 50 MG PO TABS
50.0000 mg | ORAL_TABLET | Freq: Three times a day (TID) | ORAL | Status: DC
  Administered 2021-07-11: 50 mg via ORAL
  Filled 2021-07-11: qty 1

## 2021-07-11 NOTE — Progress Notes (Signed)
PHARMACY CONSULT NOTE FOR:  OUTPATIENT  PARENTERAL ANTIBIOTIC THERAPY (OPAT)  Indication: Diabetic Foot infection (Cellulitis/ concern for osteomyelitis)  Regimen: Ancef 2gm IV q12h and Metronidazole '500mg'$  po q12h End date: 07/28/2021   Per ID note 07/07/21 from Dr. West Bali:  patient Will need 6 weeks of antibiotics for concerns of Osteomyelitis given MRI findings and positive bone cultures ( initially 2-3 weeks IV cefazolin and PO metronidazole followed by PO Augmentin thereafter).   IV antibiotic discharge orders are pended. To discharging provider:  please sign these orders via discharge navigator,  Select New Orders & click on the button choice - Manage This Unsigned Work.     Thank you for allowing pharmacy to be a part of this patient's care.  Nicole Cella, RPh Clinical Pharmacist **Pharmacist phone directory can now be found on [email or web address] ([email or web address]).  Listed under [place].

## 2021-07-11 NOTE — TOC Transition Note (Signed)
Transition of Care (TOC) - CM/SW Discharge Note Marvetta Gibbons RN, BSN Transitions of Care Unit 4E- RN Case Manager See Treatment Team for direct phone #    Patient Details  Name: Bruce Little MRN: [id number] Date of Birth: 10-15-1971  Transition of Care [facility]) CM/SW Contact:  Dawayne Patricia, RN Phone Number: 07/11/2021, 3:04 PM   Clinical Narrative:    Pt stable for transition home today w/ wife. [employer] home wound VAC at the bedside, RW has been delivered by [employer] for home.   Pt getting AM dose of Abx here prior to discharge, have alerted Pam with [employer] for home abx needs- they will make delivery this afternoon for home abx needs tonight. Bedside education has been completed.  Notified Christy with [facility] for [facility] start of care- they will plan to see pt tomorrow for home wound vAC drsg change.   Wife to transport home- Eliquis cards provided for both 30 day free and copay assit. No further TOC needs noted.    Final next level of care: [place] Barriers to Discharge: Barriers Resolved   Patient Goals and CMS Choice Patient states their goals for this hospitalization and ongoing recovery are:: return home CMS Medicare.gov Compare Post Acute Care list provided to:: Patient Choice offered to / list presented to : Patient, Spouse  Discharge Placement                 Home w/ [facility]      Discharge Plan and Services   Discharge Planning Services: CM Consult Post Acute Care Choice: Durable Medical Equipment, Home Health          DME Arranged: Gilford Rile rolling, Vac DME Agency: Rachelle Hora Date DME Agency Contacted: 07/08/21 Time DME Agency Contacted: 1207 Representative spoke with at DME Agency: Olivia Mackie HH Arranged: RN [place] Agency: [facility] Date [place]: 07/09/21 Time [place]: 1000 Representative spoke with at [place]: [place] ([place]) Interventions     Readmission Risk  Interventions Readmission Risk Prevention Plan 07/11/2021  Transportation Screening Complete  PCP or Specialist Appt within 5-7 Days Complete  Home Care Screening Complete  Medication Review (RN CM) Complete  Some recent data might be hidden

## 2021-07-11 NOTE — Progress Notes (Signed)
Pt being D/C, VSS, IV removed, Wound vac transitioned, education done, Telebox returned, education complete.   Chrisandra Carota, RN 07/11/2021 12:18 PM

## 2021-07-11 NOTE — Discharge Summary (Signed)
Bruce Little KGY:[id number] DOB: Apr 06, 1971 DOA: 07/01/2021  PCP: [facility] [place] date: 07/01/2021  Discharge date: 07/11/2021  Admitted From: Home  Disposition:  Home   Recommendations for Outpatient Follow-up:   Follow up with PCP in 1-2 weeks  PCP Please obtain BMP/CBC, 2 view CXR in 1week,  (see Discharge instructions)   PCP Please follow up on the following pending results:    Home Health: RN   Equipment/Devices: W.Vac, Walker  Consultations: ID, Ortho, Cards, Renal Discharge Condition: Stable   CODE STATUS: Full   Diet Recommendation: Heart Healthy Low Carb  Diet Order             Diet heart healthy/carb modified Room service appropriate? Yes; Fluid consistency: Thin  Diet effective now                    Chief Complaint  Patient presents with   Wound Infection     Brief history of present illness from the day of admission and additional interim summary    Patient is a 50 y.o. male DM-2, CKD stage V, HTN-who presented with worsening left foot ulceration-found to have necrotizing left foot infection with underlying osteomyelitis and AKI on CKD stage V.  Evaluated by orthopedics-underwent debridement and subsequent left fifth ray amputation-postoperatively-went into A. fib with RVR (briefly).  Orthopedic plans on repeat incision/debridement on 8/29.  See below for further details.                                                                 Hospital Course   Sepsis due to left foot necrotizing infection with underlying acute osteomyelitis:  Sepsis physiology has resolved, he underwent fifth ray amputation on 8/25 + I&D again on 8/29 -   Wound VAC has been placed, on IV Ancef - Surgical cultures sent on 8/25 growing multiple organisms including Pasteurella multocida, strep  agalactiae, strep group G and rare staph aureus.  Discussed with orthopedic surgeon on 07/09/2019, 07/10/21, partial weightbearing on the left leg with no weight on the left toes, ID was also consulted, he had R.IJ PICC placed for IV ABX at home for 2 weeks then PO Augmentin per ID, he has an appointment with ID 07/28/21. Case management consulted for arranging home wound VAC and dressing changes.  Note patient had some bleeding from the wound site on 07/09/2021, wound VAC was discontinued for 12 hours, bleeding has completely subsided, will be DC'd home today with Ortho and ID follow up.   PAF with RVR on 8/25:  Developed RVR postoperatively on 8/25-briefly required Cardizem infusion.  He was seen by cardiologist Dr. Virgina Jock, briefly required IV Cardizem and heparin drip.  Now in sinus rhythm, on beta-blocker and stable, has been placed on Eliquis for anticoagulation, TSH and  echocardiogram were stable, post discharge can follow-up with cardiologist within 1 to 2 weeks.   AKI on CKD stage V: It appears that baseline creatinine is between 4.5 and 5.5.  Presented with a creatinine of 6.5.  AKI likely hemodynamically mediated-renal function has improved with IVF and now close to baseline. Recent renal biopsy showed diabetic nephropathy.  He has been seen by nephrology this admission, will need outpatient follow-up with Dr. Joelyn Oms.   Non anion gap metabolic acidosis: Due to CKD-has been started on oral bicarb.  Bicarbonate level is stable.  Continue to monitor.   Hyponatremia: Levels are stable for the most part.  Some fluctuation is noted from time to time, exam remains euvolemic, will be discharged with outpatient PCP and nephrology follow-up.   Recent CVA (July 2022): No longer on Plavix- as he is now on anticoagulation.    DM-2 (A1c 8.4 on 8/24): CBGs relatively stable-continue home regimen upon discharge.  PCP to monitor glycemic control.   Essential hypertension: Stable on Norvasc and beta-blocker  combination.   Normocytic anemia: Due to a combination of CKD stage V and acute illness.  PRBC transfusion on 8/25 while in the operating room. Hemoglobin remains stable.  PCP to monitor.   Obesity: BMI of 28, follow with PCP.  Discharge diagnosis     Principal Problem:   Carpal tunnel syndrome on right Active Problems:   DM2 (diabetes mellitus, type 2) (HCC)   HTN (hypertension)   CKD (chronic kidney disease) stage 5, GFR less than 15 ml/min (HCC)   Diabetic infection of left foot [facility])    Discharge instructions    Discharge Instructions     [facility] pharmacist to adjust dose for Vancomycin, Aminoglycosides and other anti-infective therapies as requested by physician.   Complete by: As directed    [facility] to provide Cath Flo 27m   Complete by: As directed    Administer for PICC line occlusion and as ordered by physician for other access device issues.   Amb referral to [facility]   Complete by: As directed    Anaphylaxis Kit: Provided to treat any anaphylactic reaction to the medication being provided to the patient if First Dose or when requested by physician   Complete by: As directed    Epinephrine 158mml vial / amp: Administer 0.19m64m0.19ml30mubcutaneously once for moderate to severe anaphylaxis, nurse to call physician and pharmacy when reaction occurs and call 911 if needed for immediate care   Diphenhydramine 50mg89mIV vial: Administer 25-50mg 619mM PRN for first dose reaction, rash, itching, mild reaction, nurse to call physician and pharmacy when reaction occurs   Sodium Chloride 0.9% NS 500ml [id number] if needed for hypovolemic blood pressure drop or as ordered by physician after call to physician with anaphylactic reaction   Change dressing on IV access line weekly and PRN   Complete by: As directed    Discharge instructions   Complete by: As directed    Follow with Primary MD [facility] [place] in 7 days   Get CBC,  CMP, 2 view Chest X ray -  checked next visit within 1 week by Primary MD   Activity: No weightbearing on the left foot, you can touch your L. foot heel with the assistance of a walker if needed but no weight on L. Foot toes,  Full fall precautions use walker/cane & assistance as needed  Disposition Home    Diet: Heart Healthy Low Carb, check CBGs QAC-HS  Special Instructions: If you have smoked or chewed Tobacco  in the last 2 yrs please stop smoking, stop any regular Alcohol  and or any Recreational drug use.  On your next visit with your primary care physician please Get Medicines reviewed and adjusted.  Please request your Prim.MD to go over all Hospital Tests and Procedure/Radiological results at the follow up, please get all Hospital records sent to your Prim MD by signing hospital release before you go home.  If you experience worsening of your admission symptoms, develop shortness of breath, life threatening emergency, suicidal or homicidal thoughts you must seek medical attention immediately by calling 911 or calling your MD immediately  if symptoms less severe.  You Must read complete instructions/literature along with all the possible adverse reactions/side effects for all the Medicines you take and that have been prescribed to you. Take any new Medicines after you have completely understood and accpet all the possible adverse reactions/side effects.   Discharge wound care:   Complete by: As directed    Keep your left foot clean and dry at all times, do not put any weight on your left foot toes   Flush IV access with Sodium Chloride 0.9% and Heparin 10 units/ml or 100 units/ml   Complete by: As directed    Home infusion instructions - [facility]   Complete by: As directed    Instructions: Flush IV access with Sodium Chloride 0.9% and Heparin 10units/ml or 100units/ml   Change dressing on IV access line: Weekly and PRN   Instructions Cath Flo 32m: Administer for PICC  Line occlusion and as ordered by physician for other access device   [facility] pharmacist to adjust dose for: Vancomycin, Aminoglycosides and other anti-infective therapies as requested by physician   Increase activity slowly   Complete by: As directed    Method of administration may be changed at the discretion of home infusion pharmacist based upon assessment of the patient and/or caregiver's ability to self-administer the medication ordered   Complete by: As directed        Discharge Medications   Allergies as of 07/11/2021   No Known Allergies      Medication List     STOP taking these medications    clopidogrel 75 MG tablet Commonly known as: PLAVIX       TAKE these medications    acetaminophen 325 MG tablet Commonly known as: TYLENOL Take 2 tablets (650 mg total) by mouth every 4 (four) hours as needed for mild pain (or temp > 37.5 C (99.5 F)).   amLODipine 10 MG tablet Commonly known as: NORVASC Take 1 tablet (10 mg total) by mouth daily.   apixaban 5 MG Tabs tablet Commonly known as: ELIQUIS Take 1 tablet (5 mg total) by mouth 2 (two) times daily.   atorvastatin 80 MG tablet Commonly known as: LIPITOR Take 1 tablet (80 mg total) by mouth daily. What changed: when to take this   ceFAZolin  IVPB Commonly known as: ANCEF Inject 2 g into the vein every 12 (twelve) hours for 17 days. Indication:  cellultis/ concerns of Osteomyelitis given MRI findings and positive bone cultures ( initially 2-3 weeks IV cefazolin and PO metronidazole followed by PO Augmentin thereafter).  First Dose: No Last Day of Therapy:  07/28/2021 Labs - Once weekly:  CBC/D and BMP, Labs - Every other week:  ESR and CRP Method of administration: IV Push Method of administration may be changed at the discretion of home  infusion pharmacist based upon assessment of the patient and/or caregiver's ability to self-administer the medication ordered. Start taking on: July 15, 2021   fenofibrate 160 MG tablet Take 1 tablet (160 mg total) by mouth daily. What changed: when to take this   glipiZIDE 5 MG tablet Commonly known as: Glucotrol Take 1 tablet (5 mg total) by mouth daily.   hydrOXYzine 25 MG tablet Commonly known as: ATARAX/VISTARIL Take 25 mg by mouth every evening.   loratadine 10 MG tablet Commonly known as: CLARITIN Take 10 mg by mouth daily as needed for allergies.   metoprolol tartrate 100 MG tablet Commonly known as: LOPRESSOR Take 1 tablet (100 mg total) by mouth 2 (two) times daily.   metroNIDAZOLE 500 MG tablet Commonly known as: FLAGYL Take 1 tablet (500 mg total) by mouth every 12 (twelve) hours for 17 days.   multivitamin Tabs tablet Take 1 tablet by mouth daily.   oxyCODONE 5 MG immediate release tablet Commonly known as: Oxy IR/ROXICODONE Take 1 tablet (5 mg total) by mouth every 6 (six) hours as needed for severe pain.   pantoprazole 40 MG tablet Commonly known as: PROTONIX Take 1 tablet (40 mg total) by mouth daily.   sodium bicarbonate 650 MG tablet Take 1 tablet (650 mg total) by mouth 2 (two) times daily.   Victoza 18 MG/3ML Sopn Generic drug: liraglutide Inject 1.8 mg into the skin daily.   zolpidem 10 MG tablet Commonly known as: AMBIEN Take 5-10 mg by mouth at bedtime as needed for sleep.               Durable Medical Equipment  (From admission, onward)           Start     Ordered   07/09/21 1006  For home use only DME Walker rolling  Once       Question Answer Comment  Walker: With 5 Inch Wheels   Patient needs a walker to treat with the following condition Weakness      07/09/21 1006              Discharge Care Instructions  (From admission, onward)           Start     Ordered   07/11/21 0000  Change dressing on IV access line weekly and PRN  (Home infusion instructions - [facility] )        07/11/21 0836   07/11/21 0000  Discharge wound care:        Comments: Keep your left foot clean and dry at all times, do not put any weight on your left foot toes   07/11/21 0955             Follow-up Information     Leandrew Koyanagi, MD. Schedule an appointment as soon as possible for a visit in 2 week(s).   Specialty: Orthopedic Surgery Contact information: [place] [address] [phone]         Rosiland Oz, MD Follow up.   Specialty: Infectious Diseases Why: 07/28/21 at 11:15am. Please call to reschedule if you are not able to make this appointment. Contact information: [address] [place] [address] [phone]         [facility]. Schedule an appointment as soon as possible for a visit in 1 week(s).   Specialty: Family Medicine Contact information: [place] [address] [phone]  Patwardhan, Reynold Bowen, MD. Schedule an appointment as soon as possible for a visit in 1 week(s).   Specialties: Cardiology, Radiology Contact information: [place] [address] [phone]         [name], MD. Schedule an appointment as soon as possible for a visit in 1 week(s).   Specialty: Nephrology Contact information: [place] [address] [phone]                 Major procedures and Radiology Reports - PLEASE review detailed and final reports thoroughly  -       DG Chest 1 View  Result Date: 07/01/2021 CLINICAL DATA:  Sepsis.  Foot infection. EXAM: CHEST  1 VIEW COMPARISON:  None. FINDINGS: Low lung volumes.The cardiomediastinal contours are normal. The lungs are clear. Pulmonary vasculature is normal. No consolidation, pleural effusion, or pneumothorax. No acute osseous abnormalities are seen. IMPRESSION: Low lung volumes without acute chest finding. Electronically Signed   By: Keith Rake M.D.   On: 07/01/2021 21:34   MR FOOT LEFT WO CONTRAST  Result  Date: 07/02/2021 CLINICAL DATA:  Left foot pain for few days. Wound along the plantar aspect of the left foot. EXAM: MRI OF THE LEFT FOOT WITHOUT CONTRAST TECHNIQUE: Multiplanar, multisequence MR imaging of the left foot was performed. No intravenous contrast was administered. COMPARISON:  None. FINDINGS: Bones/Joint/Cartilage Wound along the plantar aspect of the fifth metatarsal head. Severe bone marrow edema in the fifth metatarsal head. Small fifth MTP joint effusion. Bone marrow edema in the plantar aspect of the fourth metatarsal head which may be reactive versus secondary to early osteomyelitis. Normal alignment. No joint effusion. No acute fracture or dislocation. Ligaments Collateral ligaments are intact.  Lisfranc ligament is intact. Muscles and Tendons Flexor, peroneal and extensor compartment tendons are intact. T2 hyperintensity throughout the plantar musculature likely neurogenic. Soft tissue No fluid collection or hematoma. No soft tissue mass. Severe soft tissue edema involving the lateral aspect of the fifth metatarsal around the soft tissue wound with soft tissue emphysema as can be seen with a necrotizing infection. 7 mm fluid collection in the subcutaneous fat along the dorsal lateral aspect of the fifth metatarsal shaft concerning for a tiny abscess. IMPRESSION: 1. Wound along the plantar aspect of the fifth metatarsal head. Severe bone marrow edema in the fifth metatarsal head concerning for osteomyelitis. Small fifth MTP joint effusion which may be reactive versus secondary to septic arthritis. 2. Cellulitis along the lateral aspect of the forefoot with air seen within the soft tissues around the fifth MTP joint concerning for necrotizing infection. 3. Bone marrow edema in the plantar aspect of the fourth metatarsal head which may be reactive versus secondary to early osteomyelitis. Electronically Signed   By: Kathreen Devoid M.D.   On: 07/02/2021 10:00   IR Fluoro Guide CV Line Right  Result  Date: 07/09/2021 INDICATION: 50 year old male with history of CKD5 and lower extremity osteomyelitis requiring central venous access for long term antibiotiocs. EXAM: 1. Ultrasound-guided venipuncture of the jugular vein 2. Fluoroscopic guided placement of tunneled central venous catheter MEDICATIONS: None. ANESTHESIA/SEDATION: Local anesthesia only. FLUOROSCOPY TIME:  0 minutes 6 seconds (3 mGy). COMPLICATIONS: None immediate. PROCEDURE: Informed written consent was obtained from the patient after a discussion of the risks, benefits, and alternatives to treatment. Questions regarding the procedure were encouraged and answered. The right neck and chest were prepped with chlorhexidine in a sterile fashion, and a sterile  drape was applied covering the operative field. Maximum barrier sterile technique with sterile gowns and gloves were used for the procedure. A timeout was performed prior to the initiation of the procedure. After creating a small venotomy incision, a 21 gauge micropuncture kit was utilized to access the internal jugular vein. Real-time ultrasound guidance was utilized for vascular access including the acquisition of a permanent ultrasound image documenting patency of the accessed vessel. A Mandril wire to the level of the cavoatrial junction. A 6 Fr tunneled central venous catheter measuring 22 cm from tip to cuff was tunneled in a retrograde fashion from the anterior chest wall to the venotomy incision. A peel-away sheath was placed over the wire. The catheter was then placed through the peel-away sheath with the catheter tip ultimately positioned at the cavoatrial junction. Final catheter positioning was confirmed and documented with a spot radiographic image. The catheter aspirates and flushes normally. The catheter was flushed with appropriate volume heparin dwells. The catheter exit site was secured with a 2-0 Ethilon retention suture. The venotomy incision was closed with Dermabond. Sterile  dressings were applied. The patient tolerated the procedure well without immediate post procedural complication. IMPRESSION: Successful placement of 22 cm tip to cuff tunneled central venous catheter via the right internal jugular vein with catheter tip terminating at the cavoatrial junction. The catheter is ready for immediate use. Ruthann Cancer, MD [facility] Electronically Signed   By: Ruthann Cancer M.D.   On: 07/09/2021 11:28   IR US Guide Vasc Access Right  Result Date: 07/09/2021 INDICATION: 50 year old male with history of CKD5 and lower extremity osteomyelitis requiring central venous access for long term antibiotiocs. EXAM: 1. Ultrasound-guided venipuncture of the jugular vein 2. Fluoroscopic guided placement of tunneled central venous catheter MEDICATIONS: None. ANESTHESIA/SEDATION: Local anesthesia only. FLUOROSCOPY TIME:  0 minutes 6 seconds (3 mGy). COMPLICATIONS: None immediate. PROCEDURE: Informed written consent was obtained from the patient after a discussion of the risks, benefits, and alternatives to treatment. Questions regarding the procedure were encouraged and answered. The right neck and chest were prepped with chlorhexidine in a sterile fashion, and a sterile drape was applied covering the operative field. Maximum barrier sterile technique with sterile gowns and gloves were used for the procedure. A timeout was performed prior to the initiation of the procedure. After creating a small venotomy incision, a 21 gauge micropuncture kit was utilized to access the internal jugular vein. Real-time ultrasound guidance was utilized for vascular access including the acquisition of a permanent ultrasound image documenting patency of the accessed vessel. A Mandril wire to the level of the cavoatrial junction. A 6 Fr tunneled central venous catheter measuring 22 cm from tip to cuff was tunneled in a retrograde fashion from the anterior chest  wall to the venotomy incision. A peel-away sheath was placed over the wire. The catheter was then placed through the peel-away sheath with the catheter tip ultimately positioned at the cavoatrial junction. Final catheter positioning was confirmed and documented with a spot radiographic image. The catheter aspirates and flushes normally. The catheter was flushed with appropriate volume heparin dwells. The catheter exit site was secured with a 2-0 Ethilon retention suture. The venotomy incision was closed with Dermabond. Sterile dressings were applied. The patient tolerated the procedure well without immediate post procedural complication. IMPRESSION: Successful placement of 22 cm tip to cuff tunneled central venous catheter via the right internal jugular vein with catheter tip terminating at the cavoatrial junction. The  catheter is ready for immediate use. Ruthann Cancer, MD [facility] Electronically Signed   By: Ruthann Cancer M.D.   On: 07/09/2021 11:28   DG Foot Complete Left  Result Date: 07/01/2021 CLINICAL DATA:  Questionable sepsis. EXAM: LEFT FOOT - COMPLETE 3+ VIEW COMPARISON:  None. FINDINGS: There is no acute fracture or dislocation. The bones are mildly osteopenic. No bone erosion or periosteal elevation to suggest osteomyelitis. There is soft tissue swelling of the forefoot. There is soft tissue gas centered at the fifth MTP joint consistent with gas-forming infectious/necrotizing fasciitis. MRI may provide better evaluation if there is clinical concern for acute osteomyelitis. IMPRESSION: 1. No acute fracture or dislocation. 2. Soft tissue gas centered at the fifth MTP joint. No radiographic evidence of osteomyelitis. Electronically Signed   By: Anner Crete M.D.   On: 07/01/2021 21:36   ECHOCARDIOGRAM COMPLETE  Result Date: 07/04/2021    ECHOCARDIOGRAM REPORT   Patient Name:   JOMARION MISH Date of Exam: 07/04/2021 Medical Rec #:   [id number]    Height:       74.0 in Accession #:    [id number]   Weight:       234.3 lb Date of Birth:  1971-09-03    BSA:          2.325 m Patient Age:    74 years     BP:           163/78 mmHg Patient Gender: M            HR:           99 bpm. Exam Location:  Inpatient Procedure: 2D Echo, Cardiac Doppler and Color Doppler Indications:    Atrial fibrilation, DM, HTN.  History:        Patient has prior history of Echocardiogram examinations, most                 recent 05/20/2021.  Sonographer:    Tawnya Crook Referring Phys: [place]  1. Left ventricular ejection fraction, by estimation, is 55 to 60%. The left ventricle has normal function. The left ventricle has no regional wall motion abnormalities. There is mild left ventricular hypertrophy. Left ventricular diastolic parameters were normal.  2. Right ventricular systolic function is normal. The right ventricular size is normal. Tricuspid regurgitation signal is inadequate for assessing PA pressure.  3. The mitral valve is normal in structure. Mild mitral valve regurgitation. No evidence of mitral stenosis.  4. The aortic valve is grossly normal. Aortic valve regurgitation is trivial. No aortic stenosis is present.  5. The inferior vena cava is normal in size with greater than 50% respiratory variability, suggesting right atrial pressure of 3 mmHg. FINDINGS  Left Ventricle: Left ventricular ejection fraction, by estimation, is 55 to 60%. The left ventricle has normal function. The left ventricle has no regional wall motion abnormalities. The left ventricular internal cavity size was normal in size. There is  mild left ventricular hypertrophy. Left ventricular diastolic parameters were normal. Right Ventricle: The right ventricular size is normal. No increase in right ventricular wall thickness. Right ventricular systolic function is normal. Tricuspid regurgitation signal is inadequate for assessing PA pressure. Left Atrium: Left atrial  size was normal in size. Right Atrium: Right atrial size was normal in size. Pericardium: Trivial pericardial effusion is present. Mitral Valve: The mitral valve is normal in structure. Mild mitral valve regurgitation. No evidence of mitral valve stenosis. Tricuspid Valve:  The tricuspid valve is normal in structure. Tricuspid valve regurgitation is trivial. No evidence of tricuspid stenosis. Aortic Valve: The aortic valve is grossly normal. Aortic valve regurgitation is trivial. No aortic stenosis is present. Aortic valve mean gradient measures 5.0 mmHg. Aortic valve peak gradient measures 9.7 mmHg. Aortic valve area, by VTI measures 3.44 cm. Pulmonic Valve: The pulmonic valve was normal in structure. Pulmonic valve regurgitation is not visualized. No evidence of pulmonic stenosis. Aorta: The aortic root is normal in size and structure. Venous: The inferior vena cava is normal in size with greater than 50% respiratory variability, suggesting right atrial pressure of 3 mmHg. IAS/Shunts: No atrial level shunt detected by color flow Doppler.  LEFT VENTRICLE PLAX 2D LVIDd:         5.60 cm     Diastology LVIDs:         3.50 cm     LV e' medial:  11.30 cm/s LV PW:         1.40 cm     LV e' lateral: 10.10 cm/s LV IVS:        1.30 cm LVOT diam:     2.40 cm LV SV:         100 LV SV Index:   43 LVOT Area:     4.52 cm  LV Volumes (MOD) LV vol d, MOD A4C: 93.0 ml LV vol s, MOD A4C: 41.7 ml LV SV MOD A4C:     93.0 ml RIGHT VENTRICLE             IVC RV S prime:     11.90 cm/s  IVC diam: 2.00 cm TAPSE (M-mode): 2.2 cm LEFT ATRIUM            Index       RIGHT ATRIUM           Index LA diam:      4.30 cm  1.85 cm/m  RA Area:     19.10 cm LA Vol (A2C): 142.0 ml 61.08 ml/m RA Volume:   52.60 ml  22.62 ml/m LA Vol (A4C): 55.3 ml  23.79 ml/m  AORTIC VALVE                    PULMONIC VALVE AV Area (Vmax):    3.42 cm     PV Vmax:       0.87 m/s AV Area (Vmean):   3.39 cm     PV Peak grad:  3.0 mmHg AV Area (VTI):     3.44 cm AV  Vmax:           156.00 cm/s AV Vmean:          109.000 cm/s AV VTI:            0.289 m AV Peak Grad:      9.7 mmHg AV Mean Grad:      5.0 mmHg LVOT Vmax:         118.00 cm/s LVOT Vmean:        81.600 cm/s LVOT VTI:          0.220 m LVOT/AV VTI ratio: 0.76  AORTA Ao Root diam: 3.40 cm Ao Asc diam:  3.30 cm MR Peak grad: 91.4 mmHg MR Vmax:      478.00 cm/s  SHUNTS MV A velocity: 77.10 cm/s  Systemic VTI:  0.22 m  Systemic Diam: 2.40 cm Cherlynn Kaiser MD Electronically signed by Cherlynn Kaiser MD Signature Date/Time: 07/04/2021/4:07:53 PM    Final       Today   Subjective    [facility] today has no headache,no chest abdominal pain,no new weakness tingling or numbness, feels much better wants to go home today.     Objective   Blood pressure (!) 175/85, pulse 75, temperature 97.8 F (36.6 C), temperature source Oral, resp. rate 16, height 6' 2"  (1.88 m), weight 104.4 kg, SpO2 99 %.   Intake/Output Summary (Last 24 hours) at 07/11/2021 1000 Last data filed at 07/11/2021 2767 Gross per 24 hour  Intake 890 ml  Output 2925 ml  Net -2035 ml    Exam  Awake Alert, No new F.N deficits, Normal affect [place].AT,PERRAL Supple Neck,No JVD, No cervical lymphadenopathy appriciated.  Symmetrical Chest wall movement, Good air movement bilaterally, CTAB RRR,No Gallops, Rubs or new Murmurs, No Parasternal Heave +ve B.Sounds, Abd Soft, No tenderness, No organomegaly appriciated, No rebound - guarding or rigidity. 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, R IJ PICC - site clean   Data Review   CBC w Diff:  Lab Results  Component Value Date   WBC 14.1 (H) 07/11/2021   HGB 9.3 (L) 07/11/2021   HCT 28.5 (L) 07/11/2021   PLT 434 (H) 07/11/2021   LYMPHOPCT 13 07/11/2021   MONOPCT 5 07/11/2021   EOSPCT 3 07/11/2021   BASOPCT 0 07/11/2021    CMP:  Lab Results  Component Value Date   NA 129 (L) 07/11/2021   K 3.9 07/11/2021   CL 102 07/11/2021   CO2 19 (L) 07/11/2021   BUN 49  (H) 07/11/2021   CREATININE 4.44 (H) 07/11/2021   PROT 6.6 07/11/2021   ALBUMIN 2.0 (L) 07/11/2021   BILITOT 0.5 07/11/2021   ALKPHOS 71 07/11/2021   AST 19 07/11/2021   ALT 5 07/11/2021  .   Total Time in preparing paper work, data evaluation and todays exam - 80 minutes  Lala Lund M.D on 07/11/2021 at 10:00 AM  [facility]

## 2021-07-11 NOTE — Plan of Care (Signed)
  Problem: Education: Goal: Knowledge of General Education information will improve Description: Including pain rating scale, medication(s)/side effects and non-pharmacologic comfort measures Outcome: Adequate for Discharge   Problem: Health Behavior/Discharge Planning: Goal: Ability to manage health-related needs will improve Outcome: Adequate for Discharge   Problem: Clinical Measurements: Goal: Ability to maintain clinical measurements within normal limits will improve Outcome: Adequate for Discharge Goal: Will remain free from infection Outcome: Adequate for Discharge Goal: Diagnostic test results will improve Outcome: Adequate for Discharge Goal: Respiratory complications will improve Outcome: Adequate for Discharge Goal: Cardiovascular complication will be avoided Outcome: Adequate for Discharge   Problem: Activity: Goal: Risk for activity intolerance will decrease Outcome: Adequate for Discharge   Problem: Coping: Goal: Level of anxiety will decrease Outcome: Adequate for Discharge   Problem: Elimination: Goal: Will not experience complications related to bowel motility Outcome: Adequate for Discharge Goal: Will not experience complications related to urinary retention Outcome: Adequate for Discharge   Problem: Pain Managment: Goal: General experience of comfort will improve Outcome: Adequate for Discharge   Problem: Safety: Goal: Ability to remain free from injury will improve Outcome: Adequate for Discharge   Problem: Skin Integrity: Goal: Risk for impaired skin integrity will decrease Outcome: Adequate for Discharge   Problem: Education: Goal: Knowledge of the prescribed therapeutic regimen will improve Outcome: Adequate for Discharge Goal: Ability to verbalize activity precautions or restrictions will improve Outcome: Adequate for Discharge Goal: Understanding of discharge needs will improve Outcome: Adequate for Discharge   Problem: Activity: Goal:  Ability to perform//tolerate increased activity and mobilize with assistive devices will improve Outcome: Adequate for Discharge   Problem: Clinical Measurements: Goal: Postoperative complications will be avoided or minimized Outcome: Adequate for Discharge   Problem: Self-Care: Goal: Ability to meet self-care needs will improve Outcome: Adequate for Discharge   Problem: Self-Concept: Goal: Ability to maintain and perform role responsibilities to the fullest extent possible will improve Outcome: Adequate for Discharge   Problem: Pain Management: Goal: Pain level will decrease with appropriate interventions Outcome: Adequate for Discharge   Problem: Skin Integrity: Goal: Demonstration of wound healing without infection will improve Outcome: Adequate for Discharge   Problem: Acute Rehab OT Goals (only OT should resolve) Goal: Pt. Will Perform Grooming Outcome: Adequate for Discharge Goal: Pt. Will Perform Lower Body Dressing Outcome: Adequate for Discharge Goal: Pt. Will Transfer To Toilet Outcome: Adequate for Discharge Goal: Pt. Will Perform Toileting-Clothing Manipulation Outcome: Adequate for Discharge   Problem: Acute Rehab PT Goals(only PT should resolve) Goal: Patient Will Transfer Sit To/From Stand Outcome: Adequate for Discharge Goal: Pt Will Ambulate Outcome: Adequate for Discharge Goal: Pt Will Go Up/Down Stairs Outcome: Adequate for Discharge

## 2021-07-22 ENCOUNTER — Ambulatory Visit (INDEPENDENT_AMBULATORY_CARE_PROVIDER_SITE_OTHER): Payer: BC Managed Care – PPO | Admitting: Orthopaedic Surgery

## 2021-07-22 ENCOUNTER — Other Ambulatory Visit: Payer: Self-pay

## 2021-07-22 ENCOUNTER — Encounter: Payer: Self-pay | Admitting: Orthopaedic Surgery

## 2021-07-22 DIAGNOSIS — E11628 Type 2 diabetes mellitus with other skin complications: Secondary | ICD-10-CM

## 2021-07-22 DIAGNOSIS — L089 Local infection of the skin and subcutaneous tissue, unspecified: Secondary | ICD-10-CM

## 2021-07-22 MED ORDER — TRAMADOL HCL 50 MG PO TABS: 50.0000 mg | ORAL_TABLET | Freq: Every day | ORAL | 0 refills | Status: DC | PRN

## 2021-07-22 NOTE — Progress Notes (Signed)
   Post-Op Visit Note   Patient: Bruce Little           Date of Birth: 08-25-1971           MRN: [id number] Visit Date: 07/22/2021 PCP: [facility] [place]:  Chief Complaint:  Chief Complaint  Patient presents with   Left Foot - Follow-up, Routine Post Op    I&D Left foot 07/07/2021   Visit Diagnoses:  1. Diabetic infection of left foot [facility])     Plan: Kaydn returns today 2 weeks after a left foot I&D and fifth metatarsal amputation.  He has been getting wound VAC changes every Monday Wednesday Friday.  He has been doing well.  Wife is present today.  He has been tolerating the antibiotics well.  Wound VAC removed today and the wound bed shows beefy red bleeding tissue more proximally.  The tissue more distally is stable and does not appear to be compromised or infected but needs more granulation tissue.  We will continue wound VAC changes and the plan is to [facility] to closure.  Recheck in 3 weeks.  He does have an upcoming appointment with infectious disease to talk about antibiotic therapy.  Follow-Up Instructions: Return in about 3 weeks (around 08/12/2021).   Orders:  No orders of the defined types were placed in this encounter.  Meds ordered this encounter  Medications   traMADol (ULTRAM) 50 MG tablet    Sig: Take 1-2 tablets (50-100 mg total) by mouth daily as needed.    Dispense:  30 tablet    Refill:  0    Imaging: No results found.  PMFS History: Patient Active Problem List   Diagnosis Date Noted   Carpal tunnel syndrome on right 07/10/2021   Diabetic infection of left foot ([place]) 07/03/2021   CKD (chronic kidney disease) stage 5, GFR less than 15 ml/min ([place]) 07/02/2021   Renal insufficiency 05/20/2021   DM2 (diabetes mellitus, type 2) ([place]) 05/20/2021   Acute ischemic stroke ([place]) 05/20/2021   HTN (hypertension) 05/20/2021   Past Medical History:  Diagnosis Date   DM2 (diabetes mellitus, type 2) ([place])    HTN (hypertension)      Family History  Problem Relation Age of Onset   Stroke Mother     Past Surgical History:  Procedure Laterality Date   APPLICATION OF WOUND VAC Left 07/07/2021   Procedure: APPLICATION OF WOUND VAC;  Surgeon: Leandrew Koyanagi, MD;  Location: [place];  Service: Orthopedics;  Laterality: Left;   I & D EXTREMITY Left 07/03/2021   Procedure: IRRIGATION AND DEBRIDEMENT ,FIFTH RAY  AMPUTATION LEFT FOOT, , WOUND VAC PLACEMENT;  Surgeon: Leandrew Koyanagi, MD;  Location: [place];  Service: Orthopedics;  Laterality: Left;   I & D EXTREMITY Left 07/07/2021   Procedure: IRRIGATION AND DEBRIDEMENT LEFT FOOT;  Surgeon: Leandrew Koyanagi, MD;  Location: [place];  Service: Orthopedics;  Laterality: Left;   IR FLUORO GUIDE CV LINE RIGHT  07/09/2021   IR US GUIDE VASC ACCESS RIGHT  07/09/2021   Social History   Occupational History   Not on file  Tobacco Use   Smoking status: Never   Smokeless tobacco: Never  Vaping Use   Vaping Use: Never used  Substance and Sexual Activity   Alcohol use: Yes    Comment: occ   Drug use: Never   Sexual activity: Not on file

## 2021-07-28 ENCOUNTER — Encounter: Payer: Self-pay | Admitting: Infectious Diseases

## 2021-07-28 ENCOUNTER — Telehealth: Payer: Self-pay

## 2021-07-28 ENCOUNTER — Other Ambulatory Visit: Payer: Self-pay | Admitting: Infectious Diseases

## 2021-07-28 ENCOUNTER — Other Ambulatory Visit: Payer: Self-pay

## 2021-07-28 ENCOUNTER — Ambulatory Visit (INDEPENDENT_AMBULATORY_CARE_PROVIDER_SITE_OTHER): Payer: BC Managed Care – PPO | Admitting: Infectious Diseases

## 2021-07-28 VITALS — HR 88 | Temp 97.5°F | Wt 216.6 lb

## 2021-07-28 DIAGNOSIS — E11628 Type 2 diabetes mellitus with other skin complications: Secondary | ICD-10-CM | POA: Diagnosis not present

## 2021-07-28 DIAGNOSIS — Z5181 Encounter for therapeutic drug level monitoring: Secondary | ICD-10-CM | POA: Diagnosis not present

## 2021-07-28 DIAGNOSIS — L089 Local infection of the skin and subcutaneous tissue, unspecified: Secondary | ICD-10-CM

## 2021-07-28 MED ORDER — METRONIDAZOLE 500 MG PO TABS: 500.0000 mg | ORAL_TABLET | Freq: Two times a day (BID) | ORAL | 0 refills | Status: DC

## 2021-07-28 NOTE — Telephone Encounter (Signed)
Per MD called [facility] with verbal order to extend pt IV antbx x3 weeks. New end date is 10/10.  Will also need to add ESR and CRP to recent blood draw.  Spoke with Jeani Hawking, Pharmacist who will update nursing on new end date. Will add labs to nurse visit. Leatrice Jewels, RMA

## 2021-07-28 NOTE — Progress Notes (Addendum)
[name]                                                             [place], [place], [place], [address]                                                                  Phn. 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 Patient is s/p excisional debridement and left 5th ray amputation/wound vac application on 0/10( OR cultures growing Pastuerella multocida, Group B Strep , Group G Strep, MSSA and Bacteroides vulagtus) and s/p revision amputation of left 5th metatarsal and excisional debridement on 07/07/21. Patient discharged on IV cefazolin and PO metronidazole. Completed 3 weeks of IV cefazolin and PO metronidazole today.  Medication Monitoring  Labs 9/12/22cr 3.80, WBC 5.1, hb 14.6, plts 238  Problem List Items Addressed This Visit       Endocrine   Diabetic infection of left foot ([place]) - Primary   Relevant Medications   metroNIDAZOLE (FLAGYL) 500 MG tablet   Other Relevant Orders   C-reactive protein   Sedimentation rate     Other   Medication monitoring encounter     Plan Will get ESR and CRP today. If looks OK/down trending, plan to switch to PO Augmentin for the remaining 3 weeks Continue IV cefazolin and PO metronidazole for now until next follow up pending labs from today Fu with Orthopedics as instructed Fu in 3 weeks  DM control   All questions and concerns were discussed and addressed. Patient verbalized understanding of the  plan. ____________________________________________________________________________________________________________________  HPI/interval Events 07/28/21 Patient is here for follow up for left DFU/Osteomyelitis. He is getting IV cefazolin through the tunneled line in the Rt chest. No pain/tenderness/swelling noted at tunneled line site. Denies any side effects with the abtx like nausea, vomiting, abdominal pain and diarrhea. He has followed up with Orthopedics Dr Erlinda Hong on 9/13, wound vac removed, wound bed with beefy red bleeding tissue. Wound was thought to be healing well. Plan is to [facility] to closure. He is also taking Metronidazole twice a day without any issues.  Denies any discharge/swelling or drainage at the wound site. He is pleased with the progress of the wound.   ROS: Constitutional: Negative for fever, chills, activity change, appetite change, fatigue and  unexpected weight change.  Respiratory: Negative for cough, shortness of breath Cardiovascular: Negative for chest pain, palpitations and leg swelling.  Gastrointestinal: Negative for nausea, vomiting, abdominal pain, diarrhea/constipation, .  Genitourinary: Negative for dysuria, hematuria, flank pain Musculoskeletal: Negative for myalgias, arthralgia, back pain, joint swelling, arthralgias Skin: Negative for rashes, lesions  Neurological: Negative for weakness, dizziness or headache  Past Medical History:  Diagnosis Date   DM2 (diabetes mellitus, type 2) ([place])    HTN (hypertension)    Past Surgical History:  Procedure Laterality Date   APPLICATION OF WOUND VAC Left 07/07/2021   Procedure: APPLICATION OF WOUND VAC;  Surgeon: Leandrew Koyanagi, MD;  Location: [facility];  Service: Orthopedics;  Laterality: Left;   I & D EXTREMITY Left 07/03/2021   Procedure: IRRIGATION AND DEBRIDEMENT ,FIFTH RAY  AMPUTATION LEFT FOOT, , WOUND VAC PLACEMENT;  Surgeon: Leandrew Koyanagi, MD;  Location: [facility];  Service: Orthopedics;  Laterality: Left;   I & D EXTREMITY  Left 07/07/2021   Procedure: IRRIGATION AND DEBRIDEMENT LEFT FOOT;  Surgeon: Leandrew Koyanagi, MD;  Location: [facility];  Service: Orthopedics;  Laterality: Left;   IR FLUORO GUIDE CV LINE RIGHT  07/09/2021   IR US GUIDE VASC ACCESS RIGHT  07/09/2021   Current Outpatient Medications on File Prior to Visit  Medication Sig Dispense Refill   ceFAZolin (ANCEF) IVPB Inject 2 g into the vein every 12 (twelve) hours for 17 days. Indication:  cellultis/ concerns of Osteomyelitis given MRI findings and positive bone cultures ( initially 2-3 weeks IV cefazolin and PO metronidazole followed by PO Augmentin thereafter).  First Dose: No Last Day of Therapy:  07/28/2021 Labs - Once weekly:  CBC/D and BMP, Labs - Every other week:  ESR and CRP Method of administration: IV Push Method of administration may be changed at the discretion of home infusion pharmacist based upon assessment of the patient and/or caregiver's ability to self-administer the medication ordered. 36 Units 0   acetaminophen (TYLENOL) 325 MG tablet Take 2 tablets (650 mg total) by mouth every 4 (four) hours as needed for mild pain (or temp > 37.5 C (99.5 F)). 30 tablet 0   amLODipine (NORVASC) 10 MG tablet Take 1 tablet (10 mg total) by mouth daily. 30 tablet 0   apixaban (ELIQUIS) 5 MG TABS tablet Take 1 tablet (5 mg total) by mouth 2 (two) times daily. 60 tablet 0   atorvastatin (LIPITOR) 80 MG tablet Take 1 tablet (80 mg total) by mouth daily. (Patient taking differently: Take 80 mg by mouth every evening.) 30 tablet 0   fenofibrate 160 MG tablet Take 1 tablet (160 mg total) by mouth daily. (Patient taking differently: Take 160 mg by mouth every evening.) 30 tablet 0   glipiZIDE (GLUCOTROL) 5 MG tablet Take 1 tablet (5 mg total) by mouth daily. 30 tablet 0   hydrOXYzine (ATARAX/VISTARIL) 25 MG tablet Take 25 mg by mouth every evening.     loratadine (CLARITIN) 10 MG tablet Take 10 mg by mouth daily as needed for allergies.     metoprolol tartrate  (LOPRESSOR) 100 MG tablet Take 1 tablet (100 mg total) by mouth 2 (two) times daily. 60 tablet 0   metroNIDAZOLE (FLAGYL) 500 MG tablet Take 1 tablet (500 mg total) by mouth every 12 (twelve) hours for 17 days. 34 tablet 0   multivitamin (ONE-A-DAY MEN'S) TABS tablet Take 1 tablet by mouth daily.     oxyCODONE (OXY IR/ROXICODONE) 5 MG immediate release tablet Take 1  tablet (5 mg total) by mouth every 6 (six) hours as needed for severe pain. 15 tablet 0   pantoprazole (PROTONIX) 40 MG tablet Take 1 tablet (40 mg total) by mouth daily. 30 tablet 0   sodium bicarbonate 650 MG tablet Take 1 tablet (650 mg total) by mouth 2 (two) times daily. 60 tablet 0   traMADol (ULTRAM) 50 MG tablet Take 1-2 tablets (50-100 mg total) by mouth daily as needed. 30 tablet 0   VICTOZA 18 MG/3ML SOPN Inject 1.8 mg into the skin daily.     zolpidem (AMBIEN) 10 MG tablet Take 5-10 mg by mouth at bedtime as needed for sleep.     No current facility-administered medications on file prior to visit.   No Known Allergies  Social History   Socioeconomic History   Marital status: Married    Spouse name: Not on file   Number of children: Not on file   Years of education: Not on file   Highest education level: Not on file  Occupational History   Not on file  Tobacco Use   Smoking status: Never   Smokeless tobacco: Never  Vaping Use   Vaping Use: Never used  Substance and Sexual Activity   Alcohol use: Yes    Comment: occ   Drug use: Never   Sexual activity: Not on file  Other Topics Concern   Not on file  Social History Narrative   Not on file   Social Determinants of Health   Financial Resource Strain: Not on file  Food Insecurity: Not on file  Transportation Needs: Not on file  Physical Activity: Not on file  Stress: Not on file  Social Connections: Not on file  Intimate Partner Violence: Not on file   Family History  Problem Relation Age of Onset   Stroke Mother      Vitals Wt 216 lb 9.6 oz  (98.2 kg)   BMI 27.81 kg/m    Examination  General - not in acute distress, comfortably sitting in chair HEENT - PEERLA, no pallor and no icterus Chest - b/l clear air entry, no additional sounds CVS- Normal s1s2, RRR Abdomen - Soft, Non tender , non distended Ext- no pedal edema Left foot wrapped in a bandage, has a wound vac in place, able to wriggle toes.  Neuro: grossly normal Back - WNL Psych : calm and cooperative   Recent labs CBC Latest Ref Rng & Units 07/11/2021 07/10/2021 07/09/2021  WBC 4.0 - 10.5 K/uL 14.1(H) 11.2(H) 12.3(H)  Hemoglobin 13.0 - 17.0 g/dL 9.3(L) 9.1(L) 9.2(L)  Hematocrit 39.0 - 52.0 % 28.5(L) 27.8(L) 27.2(L)  Platelets 150 - 400 K/uL 434(H) 404(H) 379   CMP Latest Ref Rng & Units 07/11/2021 07/10/2021 07/09/2021  Glucose 70 - 99 mg/dL 154(H) 197(H) 176(H)  BUN 6 - 20 mg/dL 49(H) 53(H) 58(H)  Creatinine 0.61 - 1.24 mg/dL 4.44(H) 4.77(H) 5.10(H)  Sodium 135 - 145 mmol/L 129(L) 133(L) 131(L)  Potassium 3.5 - 5.1 mmol/L 3.9 3.9 4.0  Chloride 98 - 111 mmol/L 102 104 104  CO2 22 - 32 mmol/L 19(L) 19(L) 17(L)  Calcium 8.9 - 10.3 mg/dL 8.5(L) 8.7(L) 8.6(L)  Total Protein 6.5 - 8.1 g/dL 6.6 6.7 6.5  Total Bilirubin 0.3 - 1.2 mg/dL 0.5 0.6 0.3  Alkaline Phos 38 - 126 U/L 71 78 77  AST 15 - 41 U/L 19 20 23   ALT 0 - 44 U/L 5 9 17     Pertinent Microbiology Results for orders placed or performed  during the hospital encounter of 07/01/21  Blood Culture (routine x 2)     Status: None   Collection Time: 07/01/21  8:45 PM   Specimen: BLOOD  Result Value Ref Range Status   Specimen Description BLOOD RIGHT ANTECUBITAL  Final   Special Requests   Final    BOTTLES DRAWN AEROBIC AND ANAEROBIC Blood Culture adequate volume   Culture   Final    NO GROWTH 5 DAYS Performed at [facility], [address], [place], [place] [address]    Report Status 07/06/2021 FINAL  Final  Blood Culture (routine x 2)     Status: None   Collection Time: 07/01/21  8:55 PM    Specimen: BLOOD  Result Value Ref Range Status   Specimen Description BLOOD LEFT ANTECUBITAL  Final   Special Requests   Final    BOTTLES DRAWN AEROBIC AND ANAEROBIC Blood Culture adequate volume   Culture   Final    NO GROWTH 5 DAYS Performed at [facility], [place] [address], [place], [place] [address]    Report Status 07/06/2021 FINAL  Final  Resp Panel by RT-PCR (Flu A&B, Covid) Nasopharyngeal Swab     Status: None   Collection Time: 07/02/21 12:51 AM   Specimen: Nasopharyngeal Swab; Nasopharyngeal(NP) swabs in vial transport medium  Result Value Ref Range Status   SARS Coronavirus 2 by RT PCR NEGATIVE NEGATIVE Final    Comment: (NOTE) SARS-CoV-2 target nucleic acids are NOT DETECTED.  The SARS-CoV-2 RNA is generally detectable in upper respiratory specimens during the acute phase of infection. The lowest concentration of SARS-CoV-2 viral copies this assay can detect is 138 copies/mL. A negative result does not preclude SARS-Cov-2 infection and should not be used as the sole basis for treatment or other patient management decisions. A negative result may occur with  improper specimen collection/handling, submission of specimen other than nasopharyngeal swab, presence of viral mutation(s) within the areas targeted by this assay, and inadequate number of viral copies(<138 copies/mL). A negative result must be combined with clinical observations, patient history, and epidemiological information. The expected result is Negative.  Fact Sheet for Patients:  EntrepreneurPulse.com.au  Fact Sheet for Healthcare Providers:  IncredibleEmployment.be  This test is no t yet approved or cleared by the [place] FDA and  has been authorized for detection and/or diagnosis of SARS-CoV-2 by FDA under an Emergency Use Authorization (EUA). This EUA will remain  in effect (meaning this test can be used) for the duration of the COVID-19 declaration  under Section 564(b)(1) of the Act, 21 U.S.C.section 360bbb-3(b)(1), unless the authorization is terminated  or revoked sooner.       Influenza A by PCR NEGATIVE NEGATIVE Final   Influenza B by PCR NEGATIVE NEGATIVE Final    Comment: (NOTE) The Xpert Xpress SARS-CoV-2/FLU/RSV plus assay is intended as an aid in the diagnosis of influenza from Nasopharyngeal swab specimens and should not be used as a sole basis for treatment. Nasal washings and aspirates are unacceptable for Xpert Xpress SARS-CoV-2/FLU/RSV testing.  Fact Sheet for Patients: EntrepreneurPulse.com.au  Fact Sheet for Healthcare Providers: IncredibleEmployment.be  This test is not yet approved or cleared by the [place] FDA and has been authorized for detection and/or diagnosis of SARS-CoV-2 by FDA under an Emergency Use Authorization (EUA). This EUA will remain in effect (meaning this test can be used) for the duration of the COVID-19 declaration under Section 564(b)(1) of the Act, 21 U.S.C. section 360bbb-3(b)(1), unless the authorization is  terminated or revoked.  Performed at [facility], [place] [address], [place], [place] [address]   Urine Culture     Status: None   Collection Time: 07/02/21  2:55 AM   Specimen: In/Out Cath Urine  Result Value Ref Range Status   Specimen Description IN/OUT CATH URINE  Final   Special Requests NONE  Final   Culture   Final    NO GROWTH Performed at [facility], [place] [address] [place], [place] [address]    Report Status 07/03/2021 FINAL  Final  Surgical pcr screen     Status: None   Collection Time: 07/02/21  8:00 PM   Specimen: Nasal Mucosa; Nasal Swab  Result Value Ref Range Status   MRSA, PCR NEGATIVE NEGATIVE Final   Staphylococcus aureus NEGATIVE NEGATIVE Final    Comment: (NOTE) The Xpert SA Assay (FDA approved for NASAL specimens in patients 59 years of age and older), is one component of a  comprehensive surveillance program. It is not intended to diagnose infection nor to guide or monitor treatment. Performed at [facility], [place] [address], [place], [place] [address]   Aerobic/Anaerobic Culture w Gram Stain (surgical/deep wound)     Status: None   Collection Time: 07/03/21  3:18 PM   Specimen: Soft Tissue, Other  Result Value Ref Range Status   Specimen Description TISSUE LEFT FOOT  Final   Special Requests NONE  Final   Gram Stain   Final    FEW SQUAMOUS EPITHELIAL CELLS PRESENT MODERATE WBC SEEN NO ORGANISMS SEEN    Culture   Final    FEW PASTEURELLA MULTOCIDA Usually susceptible to penicillin and other beta lactam agents,quinolones,macrolides and tetracyclines. RARE GROUP B STREP(S.AGALACTIAE)ISOLATED RARE STREPTOCOCCUS GROUP G Beta hemolytic streptococci are predictably susceptible to penicillin and other beta lactams. Susceptibility testing not routinely performed. RARE STAPHYLOCOCCUS AUREUS FEW BACTEROIDES VULGATUS CRITICAL RESULT CALLED TO, READ BACK BY AND VERIFIED WITH: T,BLACK RN @1513  07/05/21 EB Performed at [facility] [address] [place], [place] [address]    Report Status 07/07/2021 FINAL  Final   Organism ID, Bacteria STAPHYLOCOCCUS AUREUS  Final      Susceptibility   Staphylococcus aureus - MIC*    CIPROFLOXACIN <=0.5 SENSITIVE Sensitive     ERYTHROMYCIN <=0.25 SENSITIVE Sensitive     GENTAMICIN <=0.5 SENSITIVE Sensitive     OXACILLIN 0.5 SENSITIVE Sensitive     TETRACYCLINE <=1 SENSITIVE Sensitive     VANCOMYCIN 1 SENSITIVE Sensitive     TRIMETH/SULFA <=10 SENSITIVE Sensitive     CLINDAMYCIN <=0.25 SENSITIVE Sensitive     RIFAMPIN <=0.5 SENSITIVE Sensitive     Inducible Clindamycin NEGATIVE Sensitive     * RARE STAPHYLOCOCCUS AUREUS     Pertinent Imaging All pertinent labs/Imagings/notes reviewed. All pertinent plain films and CT images have been personally visualized and interpreted; radiology reports have been  reviewed. Decision making incorporated into the Impression / Recommendations.  I have spent a total of 60 minutes of face-to-face and non-face-to-face time, excluding clinical staff time, preparing to see patient, ordering tests and/or medications, and provide counseling the patient    Electronically signed by:  Rosiland Oz, MD Infectious Disease Physician [facility] [address] [place], [place] [address] Phone: [phone]  Fax: [phone]

## 2021-07-30 DIAGNOSIS — I480 Paroxysmal atrial fibrillation: Secondary | ICD-10-CM | POA: Insufficient documentation

## 2021-07-30 NOTE — Progress Notes (Addendum)
Follow up visit  Subjective:   [place], male    DOB: 06-Oct-1971, 50 y.o.   MRN: [id number]     HPI  Chief Complaint  Patient presents with   Atrial Fibrillation   Hospitalization Follow-up    50 y.o. Caucasian male  with hypertension, type 2 DM, h/o stroke, CKD V, admitted with foot ulceration-found to have necrotizing left foot infection with underlying osteomyelitis and AKI on CKD stage V, paroxysmal Afib  Patient is doing well.  He is recuperating well from his amputation.  He still has wound VAC in place.  He has nursing care for dressing changes every 2 days.  He denies any chest pain, shortness with, palpitation symptoms.  He is walking using a walker.  He is compliant with his medical therapy. He has no bleeding issues with Eliquis.     Consultation HPI 06/2021: Patient is a middle school teacher, has mildly uncontrolled diabetes at baseline.  He had an inverted injury to his fifth toe which turned into necrotizing fasciitis and osteomyelitis.  Patient underwent fifth toe amputation by orthopedics, currently has a wound VAC in place.  There is plan for debridement at some point during this hospitalization.  On 07/03/2021, patient had an episode of A. fib associated with no symptoms, with ventricular rate in 150s.  Patient has self converted and has stayed in sinus rhythm since then.  At baseline, patient denies any chest pain, shortness of breath, palpitation symptoms.  Wife endorses that patient snores at night.   Patient was recently hospitalized in July 2022 with left-sided numbness and weakness.  MRI brain showed for small acute/early subacute infarct of the right caudate body, no large vessel occlusion.  He was discharged on aspirin 325 mg daily, and Plavix 75 mg daily.  There was no prior evidence of A. fib up until now.       Past Medical History:       Current Outpatient Medications on File Prior to Visit  Medication Sig Dispense Refill   acetaminophen  (TYLENOL) 325 MG tablet Take 2 tablets (650 mg total) by mouth every 4 (four) hours as needed for mild pain (or temp > 37.5 C (99.5 F)). 30 tablet 0   amLODipine (NORVASC) 10 MG tablet Take 1 tablet (10 mg total) by mouth daily. 30 tablet 0   apixaban (ELIQUIS) 5 MG TABS tablet Take 1 tablet (5 mg total) by mouth 2 (two) times daily. 60 tablet 0   atorvastatin (LIPITOR) 80 MG tablet Take 1 tablet (80 mg total) by mouth daily. (Patient taking differently: Take 80 mg by mouth every evening.) 30 tablet 0   fenofibrate 160 MG tablet Take 1 tablet (160 mg total) by mouth daily. (Patient taking differently: Take 160 mg by mouth every evening.) 30 tablet 0   glipiZIDE (GLUCOTROL) 5 MG tablet Take 1 tablet (5 mg total) by mouth daily. 30 tablet 0   hydrOXYzine (ATARAX/VISTARIL) 25 MG tablet Take 25 mg by mouth every evening.     loratadine (CLARITIN) 10 MG tablet Take 10 mg by mouth daily as needed for allergies.     metoprolol tartrate (LOPRESSOR) 100 MG tablet Take 1 tablet (100 mg total) by mouth 2 (two) times daily. 60 tablet 0   metroNIDAZOLE (FLAGYL) 500 MG tablet Take 1 tablet (500 mg total) by mouth 2 (two) times daily for 21 days. 42 tablet 0   multivitamin (ONE-A-DAY MEN'S) TABS tablet Take 1 tablet by mouth daily.  oxyCODONE (OXY IR/ROXICODONE) 5 MG immediate release tablet Take 1 tablet (5 mg total) by mouth every 6 (six) hours as needed for severe pain. 15 tablet 0   pantoprazole (PROTONIX) 40 MG tablet Take 1 tablet (40 mg total) by mouth daily. 30 tablet 0   sodium bicarbonate 650 MG tablet Take 1 tablet (650 mg total) by mouth 2 (two) times daily. 60 tablet 0   traMADol (ULTRAM) 50 MG tablet Take 1-2 tablets (50-100 mg total) by mouth daily as needed. 30 tablet 0   VICTOZA 18 MG/3ML SOPN Inject 1.8 mg into the skin daily.     zolpidem (AMBIEN) 10 MG tablet Take 5-10 mg by mouth at bedtime as needed for sleep.     No current facility-administered medications on file prior to visit.     Cardiovascular & other pertient studies:  EKG 07/31/2021: Sinus rhythm 95 bpm Left ventricular hypertrophy Nonspecific ST depression   EKG 07/03/2021: Afib w/RVR 152 bpm Nonspecific ST-T abnormality   Echocardiogram 05/20/2021:  1. Left ventricular ejection fraction, by estimation, is 60 to 65%. The  left ventricle has normal function. The left ventricle has no regional  wall motion abnormalities. There is mild concentric left ventricular  hypertrophy. Left ventricular diastolic  function could not be evaluated.   2. Right ventricular systolic function is normal. The right ventricular  size is normal. Tricuspid regurgitation signal is inadequate for assessing  PA pressure.   3. Left atrial size was mildly dilated.   4. The mitral valve is normal in structure. Trivial mitral valve  regurgitation. No evidence of mitral stenosis.   5. The aortic valve is normal in structure. Aortic valve regurgitation is  not visualized. No aortic stenosis is present.   6. The inferior vena cava is normal in size with <50% respiratory  variability, suggesting right atrial pressure of 8 mmHg.     Recent labs: 07/11/2021: Glucose 154, BUN/Cr 49/4.44. EGFR 15. Na/K 129/3.9. Albumin 2.0. Rest of the CMP normal H/H 9/28. MCV 86. Platelets 434 HbA1C 8.4% Chol 271, TG 456, HDL 31, LDL 122 TSH 2.7 normal    Review of Systems  Cardiovascular:  Negative for chest pain, dyspnea on exertion, leg swelling, palpitations and syncope.  Musculoskeletal:        Wound VAC in place on on left foot        Vitals:   07/31/21 0839  BP: 134/79  Pulse: 94  Resp: 16  Temp: 98 F (36.7 C)  SpO2: 99%    Body mass index is 27.99 kg/m. Filed Weights   07/31/21 0839  Weight: 218 lb (98.9 kg)     Objective:   Physical Exam Vitals and nursing note reviewed.  Constitutional:      General: He is not in acute distress. Neck:     Vascular: No JVD.  Cardiovascular:     Rate and Rhythm: Normal rate  and regular rhythm.     Heart sounds: Normal heart sounds. No murmur heard. Pulmonary:     Effort: Pulmonary effort is normal.     Breath sounds: Normal breath sounds. No wheezing or rales.  Musculoskeletal:     Right lower leg: No edema.     Left lower leg: No edema.     Comments: Left fifth toe amputation, Wound vac and dressing in place          Assessment & Recommendations:   50 y.o. Caucasian male  with hypertension, type 2 DM, h/o stroke, CKD V, admitted with  foot ulceration-found to have necrotizing left foot infection with underlying osteomyelitis and AKI on CKD stage V, now with paroxysmal Afib  Paroxysmal A. fib: Currently in sinus rhythm. Continue metoprolol tartrate 100 mg twice daily. CHA2DS2-VASc score 4, annual stroke risk 4.6%.  Recommend anticoagulation. Continue Eliquis 5 mg twice daily.  After recovery from amputation and wound, will consider stress testing and duplex US in future  Cardiac risk stratification: Upcoming retina surgery.  Recommended continue Eliquis, if possible.  If Eliquis must be stopped, minimize interruption to 2 days before the surgery. Cardiac risk is otherwise low for the surgery.   Mixed hyperlipidemia: High LDL as well as triglycerides.  Currently on Lipitor 80 mg daily.  Fenofibrate may be contraindicatied in [place], so not reiflled. Repeat lipid panel in 3 months.    Nigel Mormon, MD Pager: [phone] Office: [phone]

## 2021-07-31 ENCOUNTER — Other Ambulatory Visit: Payer: Self-pay

## 2021-07-31 ENCOUNTER — Ambulatory Visit: Payer: BC Managed Care – PPO | Admitting: Cardiology

## 2021-07-31 ENCOUNTER — Encounter: Payer: Self-pay | Admitting: Cardiology

## 2021-07-31 VITALS — BP 134/79 | HR 94 | Temp 98.0°F | Resp 16 | Ht 74.0 in | Wt 218.0 lb

## 2021-07-31 DIAGNOSIS — I480 Paroxysmal atrial fibrillation: Secondary | ICD-10-CM

## 2021-07-31 DIAGNOSIS — E782 Mixed hyperlipidemia: Secondary | ICD-10-CM | POA: Insufficient documentation

## 2021-07-31 MED ORDER — METOPROLOL TARTRATE 100 MG PO TABS: 100.0000 mg | ORAL_TABLET | Freq: Two times a day (BID) | ORAL | 3 refills | Status: DC

## 2021-07-31 MED ORDER — ATORVASTATIN CALCIUM 80 MG PO TABS: 80.0000 mg | ORAL_TABLET | Freq: Every evening | ORAL | 3 refills | Status: DC

## 2021-07-31 MED ORDER — APIXABAN 5 MG PO TABS: 5.0000 mg | ORAL_TABLET | Freq: Two times a day (BID) | ORAL | 0 refills | Status: DC

## 2021-08-04 ENCOUNTER — Encounter: Payer: Self-pay | Admitting: Adult Health

## 2021-08-04 ENCOUNTER — Ambulatory Visit (INDEPENDENT_AMBULATORY_CARE_PROVIDER_SITE_OTHER): Payer: BC Managed Care – PPO | Admitting: Adult Health

## 2021-08-04 ENCOUNTER — Telehealth: Payer: Self-pay | Admitting: *Deleted

## 2021-08-04 ENCOUNTER — Other Ambulatory Visit: Payer: Self-pay

## 2021-08-04 VITALS — BP 157/97 | HR 86 | Ht 74.0 in | Wt 215.0 lb

## 2021-08-04 DIAGNOSIS — E785 Hyperlipidemia, unspecified: Secondary | ICD-10-CM

## 2021-08-04 DIAGNOSIS — I4891 Unspecified atrial fibrillation: Secondary | ICD-10-CM

## 2021-08-04 DIAGNOSIS — I10 Essential (primary) hypertension: Secondary | ICD-10-CM

## 2021-08-04 DIAGNOSIS — E1122 Type 2 diabetes mellitus with diabetic chronic kidney disease: Secondary | ICD-10-CM

## 2021-08-04 DIAGNOSIS — N185 Chronic kidney disease, stage 5: Secondary | ICD-10-CM

## 2021-08-04 DIAGNOSIS — Z794 Long term (current) use of insulin: Secondary | ICD-10-CM

## 2021-08-04 DIAGNOSIS — I639 Cerebral infarction, unspecified: Secondary | ICD-10-CM

## 2021-08-04 NOTE — Telephone Encounter (Signed)
Received fax from [facility] re: surgical clearance for vitrectomy w/endo Laser-Left. Date on form for surgery is 06/17/21. [place] spoke with [facility]. She will fax over new form without a date, stated the surgery has not been scheduled. They will wait to hear from NP first. .

## 2021-08-04 NOTE — Patient Instructions (Addendum)
Continue Eliquis (apixaban) daily  and atorvastatin  for secondary stroke prevention. Please stop fenofibrate as this can effect your kidney levels  Continue to follow up with PCP regarding cholesterol, blood pressure and diabetes management  Maintain strict control of hypertension with blood pressure goal below 130/90, diabetes with hemoglobin A1c goal below 7% and cholesterol with LDL cholesterol (bad cholesterol) goal below 70 mg/dL.       Followup in the future with me in 6 months or call earlier if needed      Thank you for coming to see [place] at [facility]. I hope we have been able to provide you high quality care today.  You may receive a patient satisfaction survey over the next few weeks. We would appreciate your feedback and comments so that we may continue to improve ourselves and the health of our patients.    Stroke Prevention Some medical conditions and lifestyle choices can lead to a higher risk for a stroke. You can help to prevent a stroke by eating healthy foods and exercising. It also helps to not smoke and to manage any health problems you may have. How can this condition affect me? A stroke is an emergency. It should be treated right away. A stroke can lead to brain damage or threaten your life. There is a better chance of surviving and getting better after a stroke if you get medical help right away. What can increase my risk? The following medical conditions may increase your risk of a stroke: Diseases of the heart and blood vessels (cardiovascular disease). High blood pressure (hypertension). Diabetes. High cholesterol. Sickle cell disease. Problems with blood clotting. Being very overweight. Sleeping problems (obstructivesleep apnea). Other risk factors include: Being older than age 18. A history of blood clots, stroke, or mini-stroke (TIA). Race, ethnic background, or a family history of stroke. Smoking or using tobacco  products. Taking birth control pills, especially if you smoke. Heavy alcohol and drug use. Not being active. What actions can I take to prevent this? Manage your health conditions High cholesterol. Eat a healthy diet. If this is not enough to manage your cholesterol, you may need to take medicines. Take medicines as told by your doctor. High blood pressure. Try to keep your blood pressure below 130/80. If your blood pressure cannot be managed through a healthy diet and regular exercise, you may need to take medicines. Take medicines as told by your doctor. Ask your doctor if you should check your blood pressure at home. Have your blood pressure checked every year. Diabetes. Eat a healthy diet and get regular exercise. If your blood sugar (glucose) cannot be managed through diet and exercise, you may need to take medicines. Take medicines as told by your doctor. Talk to your doctor about getting checked for sleeping problems. Signs of a problem can include: Snoring a lot. Feeling very tired. Make sure that you manage any other conditions you have. Nutrition  Follow instructions from your doctor about what to eat or drink. You may be told to: Eat and drink fewer calories each day. Limit how much salt (sodium) you use to 1,500 milligrams (mg) each day. Use only healthy fats for cooking, such as olive oil, canola oil, and sunflower oil. Eat healthy foods. To do this: Choose foods that are high in fiber. These include whole grains, and fresh fruits and vegetables. Eat at least 5 servings of fruits and vegetables a day. Try to fill one-half of your plate with fruits and vegetables  at each meal. Choose low-fat (lean) proteins. These include low-fat cuts of meat, chicken without skin, fish, tofu, beans, and nuts. Eat low-fat dairy products. Avoid foods that: Are high in salt. Have saturated fat. Have trans fat. Have cholesterol. Are processed or pre-made. Count how many carbohydrates  you eat and drink each day. Lifestyle If you drink alcohol: Limit how much you have to: 0-1 drink a day for women who are not pregnant. 0-2 drinks a day for men. Know how much alcohol is in your drink. In the [place] one drink equals one 12 oz bottle of beer (337m), one 5 oz glass of wine (1426m, or one 1 oz glass of hard liquor (4449m Do not smoke or use any products that have nicotine or tobacco. If you need help quitting, ask your doctor. Avoid secondhand smoke. Do not use drugs. Activity  Try to stay at a healthy weight. Get at least 30 minutes of exercise on most days, such as: Fast walking. Biking. Swimming. Medicines Take over-the-counter and prescription medicines only as told by your doctor. Avoid taking birth control pills. Talk to your doctor about the risks of taking birth control pills if: You are over 35 50ars old. You smoke. You get very bad headaches. You have had a blood clot. Where to find more information American Stroke Association: www.strokeassociation.org Get help right away if: You or a loved one has any signs of a stroke. "BE FAST" is an easy way to remember the warning signs: B - Balance. Dizziness, sudden trouble walking, or loss of balance. E - Eyes. Trouble seeing or a change in how you see. F - Face. Sudden weakness or loss of feeling of the face. The face or eyelid may droop on one side. A - Arms. Weakness or loss of feeling in an arm. This happens all of a sudden and most often on one side of the body. S - Speech. Sudden trouble speaking, slurred speech, or trouble understanding what people say. T - Time. Time to call emergency services. Write down what time symptoms started. You or a loved one has other signs of a stroke, such as: A sudden, very bad headache with no known cause. Feeling like you may vomit (nausea). Vomiting. A seizure. These symptoms may be an emergency. Get help right away. Call your local emergency services (911 in the  [place] Do not wait to see if the symptoms will go away. Do not drive yourself to the hospital. Summary You can help to prevent a stroke by eating healthy, exercising, and not smoking. It also helps to manage any health problems you have. Do not smoke or use any products that contain nicotine or tobacco. Get help right away if you or a loved one has any signs of a stroke. This information is not intended to replace advice given to you by your health care provider. Make sure you discuss any questions you have with your health care provider. Document Revised: 05/27/2020 Document Reviewed: 05/27/2020 Elsevier Patient Education  [address]

## 2021-08-04 NOTE — Progress Notes (Signed)
[facility] [address] [place]. [place] [address] [phone]       HOSPITAL FOLLOW UP NOTE  [name] Date of Birth:  02/10/1971 Medical Record Number:  [id number]   Reason for Referral:  hospital stroke follow up    SUBJECTIVE:   CHIEF COMPLAINT:  Chief Complaint  Patient presents with   Hospital FU, hx CVA    Rm 3   wife- Bruce Little "home on 07/11/21, getting better"    HPI:   [name] is a 50 y.o. male with history of DM2, HTN, who presented on 05/19/2021 with Left sided weakness and numbness since 7/9.  Personally reviewed hospitalization pertinent progress notes, lab work and imaging.  Evaluated by Dr. 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  Consult evidence of old right thalamus infarct and right BG ICH.  MRA head mild stenosis proximal L P2 segment.  Carotid Doppler right ICA 40 to 59% stenosis in left ICA 1 to 39% stenosis. Was not felt to symptomatic but did recommend follow-up with VVS OP.  EF 60 to 65%.  Direct LDL 122.9 and TG 456-initiated atorvastatin 80 mg daily and fenofibrate 160 mg daily.  A1c 7.4. He was found to have severe elevated creatinine level as well as significantly elevated BP.  Evaluated by nephrology with plans on completing renal biopsy OP.  Recommended DAPT for 3 weeks then Plavix alone as on aspirin PTA.  Evaluated by therapies without therapy needs and discharged home.  Today, 08/04/2021, Bruce Little is being seen for hospital follow-up accompanied by his wife, Bruce Little.  Overall stable from stroke standpoint.  Denies new or reoccurring stroke/TIA symptoms.  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 Post op, developed A. fib and placed on Eliquis 5 mg twice daily. He has been gradually recovering, remains nonweightbearing and use of wound VAC -routinely followed by  infectious disease, orthopedics and wound care.  He has remained on Eliquis 5 mg twice daily as well as atorvastatin and fenofibrate tolerating without side effects.  Blood pressure today 157/97. Routinely monitors at home which has been fluctuating based on pain and activity levels. Glucose levels monitored at home and typically 100-130.  He is questioning undergoing retina surgery previously scheduled on 8/9 but in setting of stroke, this was postponed until follow up with our office (initially scheduled f/u on 8/31 but rescheduled as he was hospitalized).  No further concerns at this time.      PERTINENT IMAGING  MR BRAIN 05/19/2021 MR ANGIO HEAD IMPRESSION: 1. Small acute/early subacute infarct of the right caudate body. No hemorrhage or mass effect. 2. 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 3. Mild stenosis of the proximal left P2 segment.  VAS US CAROTID DUPLEX 05/20/2021 Summary:  Right Carotid: Velocities in the right ICA are consistent with a 40-59% stenosis.  Left Carotid: Velocities in the left ICA are consistent with a 1-39% stenosis.  Vertebrals: Bilateral vertebral arteries demonstrate antegrade flow.   ECHO 05/20/2021 IMPRESSIONS   1. Left ventricular ejection fraction, by estimation, is 60 to 65%. The  left ventricle has normal function. The left ventricle has no regional  wall motion abnormalities. There is mild concentric left ventricular  hypertrophy. Left ventricular diastolic  function could not be evaluated.   2. Right ventricular systolic function is normal. The right ventricular  size  is normal. Tricuspid regurgitation signal is inadequate for assessing  PA pressure.   3. Left atrial size was mildly dilated.   4. The mitral valve is normal in structure. Trivial mitral valve  regurgitation. No evidence of mitral stenosis.   5. The aortic valve is normal in structure. Aortic valve regurgitation is  not visualized. No aortic stenosis is present.    6. The inferior vena cava is normal in size with <50% respiratory  variability, suggesting right atrial pressure of 8 mmHg.     ROS:   14 system review of systems performed and negative with exception of those listed in HPI  PMH:  Past Medical History:  Diagnosis Date   DM2 (diabetes mellitus, type 2) ([place])    HTN (hypertension)     PSH:  Past Surgical History:  Procedure Laterality Date   APPLICATION OF WOUND VAC Left 07/07/2021   Procedure: APPLICATION OF WOUND VAC;  Surgeon: Leandrew Koyanagi, MD;  Location: [place];  Service: Orthopedics;  Laterality: Left;   I & D EXTREMITY Left 07/03/2021   Procedure: IRRIGATION AND DEBRIDEMENT ,FIFTH RAY  AMPUTATION LEFT FOOT, , WOUND VAC PLACEMENT;  Surgeon: Leandrew Koyanagi, MD;  Location: [place];  Service: Orthopedics;  Laterality: Left;   I & D EXTREMITY Left 07/07/2021   Procedure: IRRIGATION AND DEBRIDEMENT LEFT FOOT;  Surgeon: Leandrew Koyanagi, MD;  Location: [facility] [place];  Service: Orthopedics;  Laterality: Left;   IR FLUORO GUIDE CV LINE RIGHT  07/09/2021   IR US GUIDE VASC ACCESS RIGHT  07/09/2021    Social History:  Social History   Socioeconomic History   Marital status: Married    Spouse name: Bruce Little   Number of children: Not on file   Years of education: Not on file   Highest education level: Not on file  Occupational History   Not on file  Tobacco Use   Smoking status: Never   Smokeless tobacco: Never  Vaping Use   Vaping Use: Never used  Substance and Sexual Activity   Alcohol use: Yes    Comment: rare   Drug use: Never   Sexual activity: Not on file  Other Topics Concern   Not on file  Social History Narrative   Not on file   Social Determinants of Health   Financial Resource Strain: Not on file  Food Insecurity: Not on file  Transportation Needs: Not on file  Physical Activity: Not on file  Stress: Not on file  Social Connections: Not on file  Intimate Partner Violence: Not on file    Family History:  Family History   Problem Relation Age of Onset   Stroke Mother    Cancer Mother    Heart disease Father     Medications:   Current Outpatient Medications on File Prior to Visit  Medication Sig Dispense Refill   acetaminophen (TYLENOL) 325 MG tablet Take 2 tablets (650 mg total) by mouth every 4 (four) hours as needed for mild pain (or temp > 37.5 C (99.5 F)). 30 tablet 0   amLODipine (NORVASC) 10 MG tablet Take 1 tablet (10 mg total) by mouth daily. 30 tablet 0   apixaban (ELIQUIS) 5 MG TABS tablet Take 1 tablet (5 mg total) by mouth 2 (two) times daily. 60 tablet 0   atorvastatin (LIPITOR) 80 MG tablet Take 1 tablet (80 mg total) by mouth every evening. 90 tablet 3   glipiZIDE (GLUCOTROL) 5 MG tablet Take 1 tablet (5 mg total) by mouth  daily. 30 tablet 0   hydrOXYzine (ATARAX/VISTARIL) 25 MG tablet Take 25 mg by mouth every evening.     loratadine (CLARITIN) 10 MG tablet Take 10 mg by mouth daily as needed for allergies.     metoprolol tartrate (LOPRESSOR) 100 MG tablet Take 1 tablet (100 mg total) by mouth 2 (two) times daily. 180 tablet 3   metroNIDAZOLE (FLAGYL) 500 MG tablet Take 1 tablet (500 mg total) by mouth 2 (two) times daily for 21 days. 42 tablet 0   multivitamin (ONE-A-DAY MEN'S) TABS tablet Take 1 tablet by mouth daily.     oxyCODONE (OXY IR/ROXICODONE) 5 MG immediate release tablet Take 1 tablet (5 mg total) by mouth every 6 (six) hours as needed for severe pain. 15 tablet 0   pantoprazole (PROTONIX) 40 MG tablet Take 1 tablet (40 mg total) by mouth daily. 30 tablet 0   sodium bicarbonate 650 MG tablet Take 1 tablet (650 mg total) by mouth 2 (two) times daily. 60 tablet 0   traMADol (ULTRAM) 50 MG tablet Take 1-2 tablets (50-100 mg total) by mouth daily as needed. 30 tablet 0   VICTOZA 18 MG/3ML SOPN Inject 1.8 mg into the skin daily.     zolpidem (AMBIEN) 10 MG tablet Take 5-10 mg by mouth at bedtime as needed for sleep.     No current facility-administered medications on file prior to  visit.    Allergies:  No Known Allergies    OBJECTIVE:  Physical Exam  Vitals:   08/04/21 1304  BP: (!) 157/97  Pulse: 86  Weight: 215 lb (97.5 kg)  Height: '6\' 2"'$  (1.88 m)   Body mass index is 27.6 kg/m. No results found.  Post stroke PHQ 2/9 Depression screen PHQ 2/9 08/04/2021  Decreased Interest 0  Down, Depressed, Hopeless 0  PHQ - 2 Score 0     General: well developed, well nourished, very pleasant middle-age Caucasian male, seated, in no evident distress Head: head normocephalic and atraumatic.   Neck: supple with no carotid or supraclavicular bruits Cardiovascular: regular rate and rhythm, no murmurs Musculoskeletal: no deformity Skin:  wound VAC and covering over left foot Vascular:  Normal pulses all extremities   Neurologic Exam Mental Status: Awake and fully alert.  Fluent speech and language.  Oriented to place and time. Recent and remote memory intact. Attention span, concentration and fund of knowledge appropriate. Mood and affect appropriate.  Cranial Nerves: Fundoscopic exam reveals sharp disc margins. Pupils equal, briskly reactive to light. Extraocular movements full without nystagmus. Visual fields full to confrontation. Hearing intact. Facial sensation intact. Face, tongue, palate moves normally and symmetrically.  Motor: Normal bulk and tone. Normal strength in all tested extremity muscles Sensory.: intact to touch , pinprick , position and vibratory sensation.  Coordination: Rapid alternating movements normal in all extremities. Finger-to-nose and heel-to-shin performed accurately bilaterally. Gait and Station: Arises from chair without difficulty. Stance is normal. Use of nonweightbearing scooter.  Unable to appropriately assess gait Reflexes: 1+ and symmetric. Toes downgoing.     NIHSS  0 Modified Rankin  0      ASSESSMENT: Bruce Little is a 50 y.o. 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 Vascular risk factors include HTN, HLD, DM, prior strokes on imaging and R>L carotid stenosis.  Hospitalized in August for sepsis in setting of left lower necrotizing infection and postop PAF with RVR.  PLAN:  R Caudate stroke : Recovered well without residual deficit.  Continue clopidogrel 75 mg daily  and atorvastatin 80 mg daily for secondary stroke prevention.  Discussed secondary stroke prevention measures and importance of close PCP follow up for aggressive stroke risk factor management. I have gone over the pathophysiology of stroke, warning signs and symptoms, risk factors and their management in some detail with instructions to go to the closest emergency room for symptoms of concern. HTN: BP goal <130/90.  Stable on current regimen per PCP HLD: LDL goal <70. Recent direct LDL 122.9, TG 456 -continue atorvastatin 80 mg daily per PCP/cards.  Fenofibrate recently discontinued due to possible contraindication and CKD stage V but pt has continued taking - discussed cards recommendations to d/c due to possible SE of increased serum creatinine. Cards plans on repeat lipid panel in 3 months.  DMII: A1c goal<7.0. Recent A1c 7.4. monitored by PCP Carotid stenosis: request monitoring routinely by cards or VVS. Not a candidate for CTA due to requiring contrast and continued CKD New dx of A fib: post op developed RVR 8/25 -placed on Eliquis 5 mg twice daily for CHA2DS2-VASc score of at least 4 CKD stage V: Renal biopsy showed diabetic nephropathy.  Followed by nephrology Retinal procedure:discussed typical time frame of undergoing elective procedures post stroke is typically 3-6 months when blood thinners need to be held (pt unsure if meds need to be held). As he is now on Eliquis, clearance will need to be obtained by cardiology if [facility] needs to be held. In regards to procedure from stroke standpoint, if he remains stable, he can  undergo procedure in Nov/Dec - advised clearance paperwork will need to be faxed to office    Follow up in 6 months or call earlier if needed   CC:  [place] provider: Dr. Leonie Man PCP: [facility]    I spent 56 minutes of face-to-face and non-face-to-face time with patient and wife.  This included previsit chart review including review of recent hospitalization, lab review, study review, order entry, electronic health record documentation, patient education regarding recent stroke including etiology, secondary stroke prevention measures and importance of managing stroke risk factors, recurrent hospitalization and new diagnosis of A. fib, other questions/concerns as noted above and answered all other questions to patient and wife's satisfaction  Frann Rider, AGNP-BC  [facility] [address] [place], [place] [address]  Phone [phone] Fax [phone] Note: This document was prepared with digital dictation and possible smart phrase technology. Any transcriptional errors that result from this process are unintentional.

## 2021-08-05 NOTE — Telephone Encounter (Signed)
Form received, placed on Jessica's desk for review and signature .

## 2021-08-06 NOTE — Telephone Encounter (Signed)
Patient suffered a stroke on 05/19/2021.  Would recommend waiting at least 3 months post stroke prior to undergoing procedure which requires IV sedation.  If remains stable from stroke standpoint (no new or worsening stroke/TIA symptoms or recurrent strokes) until that time, he is cleared to undergo procedure as it is not required to hold any blood thinners.  Would recommend they also obtain clearance from cardiology with new diagnosis of A. fib and on Eliquis to ensure he is cleared to pursue procedure requiring IV sedation.  Form completed and placed in out box.  Thank you.

## 2021-08-06 NOTE — Telephone Encounter (Signed)
Form faxed back to [id number] confirmation received

## 2021-08-11 ENCOUNTER — Ambulatory Visit: Payer: BC Managed Care – PPO | Admitting: Infectious Disease

## 2021-08-12 ENCOUNTER — Ambulatory Visit (INDEPENDENT_AMBULATORY_CARE_PROVIDER_SITE_OTHER): Payer: BC Managed Care – PPO | Admitting: Orthopaedic Surgery

## 2021-08-12 ENCOUNTER — Other Ambulatory Visit: Payer: Self-pay

## 2021-08-12 ENCOUNTER — Encounter: Payer: Self-pay | Admitting: Orthopaedic Surgery

## 2021-08-12 VITALS — Ht 74.0 in | Wt 215.0 lb

## 2021-08-12 DIAGNOSIS — E11628 Type 2 diabetes mellitus with other skin complications: Secondary | ICD-10-CM

## 2021-08-12 DIAGNOSIS — L089 Local infection of the skin and subcutaneous tissue, unspecified: Secondary | ICD-10-CM

## 2021-08-12 MED ORDER — TRAMADOL HCL 50 MG PO TABS: 50.0000 mg | ORAL_TABLET | Freq: Every day | ORAL | 0 refills | Status: DC | PRN

## 2021-08-12 NOTE — Progress Notes (Signed)
   Post-Op Visit Note   Patient: Bruce Little           Date of Birth: 02/04/1971           MRN: [id number] Visit Date: 08/12/2021 PCP: [facility], [place]:  Chief Complaint:  Chief Complaint  Patient presents with   Left Knee - Follow-up    I&D left foot 07/07/2021   Visit Diagnoses:  1. Diabetic infection of left foot ([place])     Plan: [place] returns today with his wife for recheck of the wound.  He has been doing well.  Remains on oral antibiotics.  Wound VAC changes have been going really well.  He has pain mainly at nighttime that tramadol really helps with.  The foot wound shows great beefy red tissue with excellent granulation.  No signs of infection.  Very happy with how the wound looks.  We will continue regular wound VAC changes every Monday Wednesday Friday by home health nurse.  Recheck in about 4 weeks.  Wound VAC was reapplied today.  Follow-Up Instructions: Return in about 4 weeks (around 09/09/2021).   Orders:  No orders of the defined types were placed in this encounter.  Meds ordered this encounter  Medications   traMADol (ULTRAM) 50 MG tablet    Sig: Take 1-2 tablets (50-100 mg total) by mouth daily as needed.    Dispense:  30 tablet    Refill:  0    Imaging: No results found.  PMFS History: Patient Active Problem List   Diagnosis Date Noted   Mixed hyperlipidemia 07/31/2021   Paroxysmal A-fib ([place]) 07/30/2021   Medication monitoring encounter 07/28/2021   Carpal tunnel syndrome on right 07/10/2021   Diabetic infection of left foot ([place]) 07/03/2021   CKD (chronic kidney disease) stage 5, GFR less than 15 ml/min ([place]) 07/02/2021   Renal insufficiency 05/20/2021   DM2 (diabetes mellitus, type 2) ([place]) 05/20/2021   Acute ischemic stroke ([place]) 05/20/2021   HTN (hypertension) 05/20/2021   Past Medical History:  Diagnosis Date   DM2 (diabetes mellitus, type 2) ([facility])    HTN (hypertension)     Family History  Problem  Relation Age of Onset   Stroke Mother    Cancer Mother    Heart disease Father     Past Surgical History:  Procedure Laterality Date   APPLICATION OF WOUND VAC Left 07/07/2021   Procedure: APPLICATION OF WOUND VAC;  Surgeon: Leandrew Koyanagi, MD;  Location: [place];  Service: Orthopedics;  Laterality: Left;   I & D EXTREMITY Left 07/03/2021   Procedure: IRRIGATION AND DEBRIDEMENT ,FIFTH RAY  AMPUTATION LEFT FOOT, , WOUND VAC PLACEMENT;  Surgeon: Leandrew Koyanagi, MD;  Location: [place];  Service: Orthopedics;  Laterality: Left;   I & D EXTREMITY Left 07/07/2021   Procedure: IRRIGATION AND DEBRIDEMENT LEFT FOOT;  Surgeon: Leandrew Koyanagi, MD;  Location: [facility];  Service: Orthopedics;  Laterality: Left;   IR FLUORO GUIDE CV LINE RIGHT  07/09/2021   IR US GUIDE VASC ACCESS RIGHT  07/09/2021   Social History   Occupational History   Not on file  Tobacco Use   Smoking status: Never   Smokeless tobacco: Never  Vaping Use   Vaping Use: Never used  Substance and Sexual Activity   Alcohol use: Yes    Comment: rare   Drug use: Never   Sexual activity: Not on file

## 2021-08-13 ENCOUNTER — Ambulatory Visit (INDEPENDENT_AMBULATORY_CARE_PROVIDER_SITE_OTHER): Payer: BC Managed Care – PPO | Admitting: Infectious Disease

## 2021-08-13 ENCOUNTER — Ambulatory Visit
Admission: RE | Admit: 2021-08-13 | Discharge: 2021-08-13 | Disposition: A | Discharge status: Home / Self Care | Payer: BC Managed Care – PPO | Source: Ambulatory Visit | Attending: Infectious Disease | Admitting: Infectious Disease

## 2021-08-13 ENCOUNTER — Other Ambulatory Visit: Payer: Self-pay

## 2021-08-13 ENCOUNTER — Encounter: Payer: Self-pay | Admitting: Infectious Disease

## 2021-08-13 ENCOUNTER — Telehealth: Payer: Self-pay

## 2021-08-13 VITALS — BP 135/91 | HR 100 | Resp 16 | Ht 74.0 in | Wt 215.8 lb

## 2021-08-13 DIAGNOSIS — E1159 Type 2 diabetes mellitus with other circulatory complications: Secondary | ICD-10-CM

## 2021-08-13 DIAGNOSIS — N289 Disorder of kidney and ureter, unspecified: Secondary | ICD-10-CM

## 2021-08-13 DIAGNOSIS — I480 Paroxysmal atrial fibrillation: Secondary | ICD-10-CM

## 2021-08-13 DIAGNOSIS — M869 Osteomyelitis, unspecified: Secondary | ICD-10-CM | POA: Diagnosis not present

## 2021-08-13 DIAGNOSIS — I10 Essential (primary) hypertension: Secondary | ICD-10-CM

## 2021-08-13 DIAGNOSIS — L089 Local infection of the skin and subcutaneous tissue, unspecified: Secondary | ICD-10-CM

## 2021-08-13 DIAGNOSIS — E11628 Type 2 diabetes mellitus with other skin complications: Secondary | ICD-10-CM

## 2021-08-13 DIAGNOSIS — N185 Chronic kidney disease, stage 5: Secondary | ICD-10-CM

## 2021-08-13 HISTORY — DX: Osteomyelitis, unspecified: M86.9

## 2021-08-13 MED ORDER — METRONIDAZOLE 500 MG PO TABS: 500.0000 mg | ORAL_TABLET | Freq: Two times a day (BID) | ORAL | 0 refills | Status: AC

## 2021-08-13 NOTE — Telephone Encounter (Signed)
Advised Lynn at [facility] that abx should be extended 6 weeks beginning 08/13/21. [facility]

## 2021-08-13 NOTE — Progress Notes (Signed)
Subjective:   Chief Complaint: followup for Diabetic foot ulcer with osteomyelitis   Patient ID: Bruce Little, male    DOB: Jun 19, 1971, 50 y.o.   MRN: [id number]  HPI  Bruce Little is a 50 year old Caucasian man with a past medical history significant for diabetes mellitus mellitus with neuropathy, chronic kidney disease stage V, hypertension hyperlipidemia paroxysmal atrial fibrillation who developed a diabetic foot infection with osteomyelitis involving his fifth toe and also possibly the fourth toe.  He had initially presented to the hospital August 24 with worsening of necrotizing infection in his foot.  He had an MRI performed which showed evidence of osteomyelitis of the fifth metatarsal head and then possible changes consistent with osteomyelitis of the fourth metatarsal head.  He had initially been started on vancomycin and Zosyn.  He was taken to the operating room by Dr. Erlinda Hong in August 25th , 2022 and performed irrigation and debridement of the foot with fifth ray amputation and placement of wound vacuum.  Cultures in the operating room yielded methicillin sensitive Staph aureus and Bacteroides.  He then went back to the operating room on the 29th and underwent further irrigation and debridement with revision of the amputation of the left fifth metatarsal and iceman of wound vacuum.  He was then placed on cefazolin and metronidazole after culture data came back and we had seen him formally the initial 3 weeks of cefazolin along with oral metronidazole.  He was seen in follow-up by my partner Dr. West Bali and she was happy with his progress but wanted check a sed rate and CRP prior to changing him to oral antibiotics.  His sedimentation rate came back in the 40s and CRP was also elevated and his cefazolin was extended along with metronidazole.  He was scheduled today in clinic with me as there was no follow-up appointment available for him to see Dr. Leighton Parody yet.  He saw Dr Erlinda Hong yesterday  who is quite happy with the appearance of the wound and found no evidence on exam for infection at this operative site.  The patient showed me a photograph of the wound which indeed showed granulation tissue.  He denies having much of way of pain in his foot.  He has not had fevers chills or systemic symptoms.  He actually feels much better than he has in a long time with renewed energy.  I reviewed his labs from home health and unfortunately his sedimentation rate has dramatically increased going from 46-102.  The repeat had normalized at 2 having been 6 already last time it was checked.  He does not have any autoimmune conditions or inflammatory arthritis or history of gout or something else to explain his elevated inflammatory markers.  I am obviously concerned about the fourth toe as there was suggestion of osteomyelitis there and that could be a reason why he is continue to have elevated inflammatory markers.  His fourth toe on exam does not have any ulceration and is not especially tender to palpation when I examined it through the dressing today.    Past Medical History:  Diagnosis Date   DM2 (diabetes mellitus, type 2) ([place])    HTN (hypertension)     Past Surgical History:  Procedure Laterality Date   APPLICATION OF WOUND VAC Left 07/07/2021   Procedure: APPLICATION OF WOUND VAC;  Surgeon: Leandrew Koyanagi, MD;  Location: [place];  Service: Orthopedics;  Laterality: Left;   I & D EXTREMITY Left 07/03/2021   Procedure: IRRIGATION  AND DEBRIDEMENT ,FIFTH RAY  AMPUTATION LEFT FOOT, , WOUND VAC PLACEMENT;  Surgeon: Leandrew Koyanagi, MD;  Location: [place];  Service: Orthopedics;  Laterality: Left;   I & D EXTREMITY Left 07/07/2021   Procedure: IRRIGATION AND DEBRIDEMENT LEFT FOOT;  Surgeon: Leandrew Koyanagi, MD;  Location: [place];  Service: Orthopedics;  Laterality: Left;   IR FLUORO GUIDE CV LINE RIGHT  07/09/2021   IR US GUIDE VASC ACCESS RIGHT  07/09/2021    Family History  Problem Relation Age  of Onset   Stroke Mother    Cancer Mother    Heart disease Father       Social History   Socioeconomic History   Marital status: Married    Spouse name: Caryl Pina   Number of children: Not on file   Years of education: Not on file   Highest education level: Not on file  Occupational History   Not on file  Tobacco Use   Smoking status: Never   Smokeless tobacco: Never  Vaping Use   Vaping Use: Never used  Substance and Sexual Activity   Alcohol use: Yes    Comment: rare   Drug use: Never   Sexual activity: Not on file  Other Topics Concern   Not on file  Social History Narrative   Not on file   Social Determinants of Health   Financial Resource Strain: Not on file  Food Insecurity: Not on file  Transportation Needs: Not on file  Physical Activity: Not on file  Stress: Not on file  Social Connections: Not on file    No Known Allergies   Current Outpatient Medications:    acetaminophen (TYLENOL) 325 MG tablet, Take 2 tablets (650 mg total) by mouth every 4 (four) hours as needed for mild pain (or temp > 37.5 C (99.5 F))., Disp: 30 tablet, Rfl: 0   amLODipine (NORVASC) 10 MG tablet, Take 1 tablet (10 mg total) by mouth daily., Disp: 30 tablet, Rfl: 0   apixaban (ELIQUIS) 5 MG TABS tablet, Take 1 tablet (5 mg total) by mouth 2 (two) times daily., Disp: 60 tablet, Rfl: 0   atorvastatin (LIPITOR) 80 MG tablet, Take 1 tablet (80 mg total) by mouth every evening., Disp: 90 tablet, Rfl: 3   glipiZIDE (GLUCOTROL) 5 MG tablet, Take 1 tablet (5 mg total) by mouth daily., Disp: 30 tablet, Rfl: 0   hydrOXYzine (ATARAX/VISTARIL) 25 MG tablet, Take 25 mg by mouth every evening., Disp: , Rfl:    loratadine (CLARITIN) 10 MG tablet, Take 10 mg by mouth daily as needed for allergies., Disp: , Rfl:    metoprolol tartrate (LOPRESSOR) 100 MG tablet, Take 1 tablet (100 mg total) by mouth 2 (two) times daily., Disp: 180 tablet, Rfl: 3   metroNIDAZOLE (FLAGYL) 500 MG tablet, Take 1 tablet  (500 mg total) by mouth 2 (two) times daily for 21 days., Disp: 42 tablet, Rfl: 0   multivitamin (ONE-A-DAY MEN'S) TABS tablet, Take 1 tablet by mouth daily., Disp: , Rfl:    oxyCODONE (OXY IR/ROXICODONE) 5 MG immediate release tablet, Take 1 tablet (5 mg total) by mouth every 6 (six) hours as needed for severe pain., Disp: 15 tablet, Rfl: 0   pantoprazole (PROTONIX) 40 MG tablet, Take 1 tablet (40 mg total) by mouth daily., Disp: 30 tablet, Rfl: 0   sodium bicarbonate 650 MG tablet, Take 1 tablet (650 mg total) by mouth 2 (two) times daily., Disp: 60 tablet, Rfl: 0   traMADol (ULTRAM) 50  MG tablet, Take 1-2 tablets (50-100 mg total) by mouth daily as needed., Disp: 30 tablet, Rfl: 0   VICTOZA 18 MG/3ML SOPN, Inject 1.8 mg into the skin daily., Disp: , Rfl:    zolpidem (AMBIEN) 10 MG tablet, Take 5-10 mg by mouth at bedtime as needed for sleep., Disp: , Rfl:    Review of Systems  Constitutional:  Negative for activity change, appetite change, chills, diaphoresis, fatigue, fever and unexpected weight change.  HENT:  Negative for congestion, rhinorrhea, sinus pressure, sneezing, sore throat and trouble swallowing.   Eyes:  Negative for photophobia and visual disturbance.  Respiratory:  Negative for cough, chest tightness, shortness of breath, wheezing and stridor.   Cardiovascular:  Negative for chest pain, palpitations and leg swelling.  Gastrointestinal:  Negative for abdominal distention, abdominal pain, anal bleeding, blood in stool, constipation, diarrhea, nausea and vomiting.  Genitourinary:  Negative for difficulty urinating, dysuria, flank pain and hematuria.  Musculoskeletal:  Negative for arthralgias, back pain, gait problem, joint swelling and myalgias.  Skin:  Positive for wound. Negative for color change, pallor and rash.  Neurological:  Negative for dizziness, tremors, weakness and light-headedness.  Hematological:  Negative for adenopathy. Does not bruise/bleed easily.   Psychiatric/Behavioral:  Negative for agitation, behavioral problems, confusion, decreased concentration, dysphoric mood and sleep disturbance.       Objective:   Physical Exam Constitutional:      Appearance: He is well-developed.  HENT:     Head: Normocephalic and atraumatic.  Eyes:     Conjunctiva/sclera: Conjunctivae normal.  Cardiovascular:     Rate and Rhythm: Normal rate and regular rhythm.  Pulmonary:     Effort: Pulmonary effort is normal. No respiratory distress.     Breath sounds: No wheezing.  Abdominal:     General: There is no distension.     Palpations: Abdomen is soft.  Musculoskeletal:        General: No tenderness. Normal range of motion.     Cervical back: Normal range of motion and neck supple.  Skin:    General: Skin is warm and dry.     Coloration: Skin is not pale.     Findings: No erythema or rash.  Neurological:     General: No focal deficit present.     Mental Status: He is alert and oriented to person, place, and time.  Psychiatric:        Mood and Affect: Mood normal.        Behavior: Behavior normal.        Thought Content: Thought content normal.        Judgment: Judgment normal.    Tunneled catheter 08/13/2021:         Assessment & Plan:  Osteomyelitis of the fifth metatarsal status post ray amputation and revision:  Certainly this site seems based on the notes from Dr. Erlinda Hong to be doing well.  Patient has received sufficient parenteral antibiotics to have cure this infection if we have had proper surgical control which it appears we do with regards to the fifth toe.  The fact that his inflammatory markers however have doubled and are now with a sed rate over 100 are concerning.  I am ordering plain films of the foot today to see if there is any evidence of osteomyelitis on plain films of the fourth toe and then after obtaining this scan I will order an MRI without contrast of his left foot.  We will extend his cefazolin in the  meantime  along with oral tonight is all.  I will also send a message to Dr. Erlinda Hong   CKD: Has had need of tunneled line and we are going to keep the line in place as it is not clear to me that we are done treating potential osteomyelitis and if his fourth toe needs surgery followed by parenteral antibiotics I would rather keep this line in place rather than have it removed.  Diabetes mellitus: Followed by PCP on oral medications.  I spent 41 minutes with the patient including face to face counseling of the patient and his wife discussing the nature of diabetes, diabetic foot infections with osteomyelitis the need for surgical and medical management how we track response to therapy including how we monitor inflammatory markers and how these labs are process, along with personally reviewing MRI of the foot performed on July 02, 2021 cultures from the operating room July 03, 2021, reviewing his inpatient labs his home health labs including CBCs metabolic panel sed rate CRP, along with review of medical records before and during the visit and in coordination of his care.

## 2021-08-15 ENCOUNTER — Telehealth: Payer: Self-pay

## 2021-08-15 ENCOUNTER — Other Ambulatory Visit: Payer: Self-pay | Admitting: Infectious Disease

## 2021-08-15 DIAGNOSIS — M869 Osteomyelitis, unspecified: Secondary | ICD-10-CM

## 2021-08-15 NOTE — Telephone Encounter (Signed)
-----   Message from Truman Hayward, MD sent at 08/15/2021  8:23 AM EDT ----- Ordering MRI on him now that we see some findings on plain films ----- Message ----- From: Interface, Rad Results In Sent: 08/14/2021   8:35 PM EDT To: Truman Hayward, MD

## 2021-08-15 NOTE — Telephone Encounter (Signed)
Spoke with patient, he says he saw the report in [place]. Let him know that Dr. Tommy Medal has ordered an MRI and that someone will be reaching out to him to schedule.   Beryle Flock, RN

## 2021-08-18 ENCOUNTER — Ambulatory Visit (HOSPITAL_COMMUNITY)
Admission: RE | Admit: 2021-08-18 | Discharge: 2021-08-18 | Disposition: A | Discharge status: Home / Self Care | Payer: BC Managed Care – PPO | Source: Ambulatory Visit | Attending: Infectious Disease | Admitting: Infectious Disease

## 2021-08-18 DIAGNOSIS — M869 Osteomyelitis, unspecified: Secondary | ICD-10-CM | POA: Diagnosis present

## 2021-08-19 ENCOUNTER — Inpatient Hospital Stay: Payer: BC Managed Care – PPO | Admitting: Adult Health

## 2021-08-21 ENCOUNTER — Telehealth: Payer: Self-pay

## 2021-08-21 NOTE — Telephone Encounter (Signed)
[facility] called to give call report. Patient's MRI results  of the foot are available in patient's chart

## 2021-08-22 ENCOUNTER — Telehealth: Payer: Self-pay | Admitting: Infectious Disease

## 2021-08-22 NOTE — Telephone Encounter (Signed)
I spoke to patient this morning and he states he is doing IV antibiotics BID.

## 2021-08-22 NOTE — Telephone Encounter (Signed)
I spoke with Dr. Erlinda Hong RT Ander Purpura and she will let Dr. Erlinda Hong know to review patient's MRI and call Dr. Tommy Medal. I have also provided her with Dr. Tommy Medal cell number.

## 2021-08-22 NOTE — Telephone Encounter (Signed)
Can we make sure Bruce Little is still on IV antibiotics. IK had labs mentioning him stopping by 10/10 but he should still be on them and he should be seeing Dr Erlinda Hong with whom I spoke yesterday given MRI findings and likely need for more surgery

## 2021-08-26 ENCOUNTER — Other Ambulatory Visit (HOSPITAL_COMMUNITY): Payer: BC Managed Care – PPO

## 2021-08-26 ENCOUNTER — Encounter: Payer: Self-pay | Admitting: Orthopaedic Surgery

## 2021-08-26 ENCOUNTER — Other Ambulatory Visit: Payer: Self-pay

## 2021-08-26 ENCOUNTER — Ambulatory Visit (INDEPENDENT_AMBULATORY_CARE_PROVIDER_SITE_OTHER): Payer: BC Managed Care – PPO | Admitting: Orthopaedic Surgery

## 2021-08-26 DIAGNOSIS — E11628 Type 2 diabetes mellitus with other skin complications: Secondary | ICD-10-CM

## 2021-08-26 DIAGNOSIS — L089 Local infection of the skin and subcutaneous tissue, unspecified: Secondary | ICD-10-CM

## 2021-08-26 NOTE — Progress Notes (Signed)
Office Visit Note   Patient: Bruce Little           Date of Birth: Feb 08, 1971           MRN: [id number] Visit Date: 08/26/2021              Requested by: Phys[facility], [address] [place],  [place] [address] PCP: [facility], [facility]   Assessment & Plan: Visit Diagnoses:  1. Diabetic infection of left foot ([place])     Plan: MRI of the left foot is concerning for osteomyelitis of the fourth toe with considerable bony marrow edema in the fourth metatarsal compatible with acute osteomyelitis.  There is also concern for continued osteomyelitis of the fifth metatarsal.  These findings were reviewed with Bruce Little and his wife in detail and I also share the same concern as Dr. Tommy Medal that the infection has not been eradicated and will need additional irrigation debridement would likely amputation of the fourth toe with possible fourth ray amputation.  He understands that his foot may not be salvageable and may require a Lisfranc or possibly more proximal amputation.  We will plan on taking him to the operating room this coming Friday with plans to admit him postoperatively.  He will stop taking his Eliquis beginning tomorrow.  Follow-Up Instructions: No follow-ups on file.   Orders:  No orders of the defined types were placed in this encounter.  No orders of the defined types were placed in this encounter.     Procedures: No procedures performed   Clinical Data: No additional findings.   Subjective: Chief Complaint  Patient presents with   Left Foot - Pain    HPI  Bruce Little returns today to discuss recent MRI of the left foot.  Wife is present today.  Review of Systems   Objective: Vital Signs: There were no vitals taken for this visit.  Physical Exam  Ortho Exam  Left foot is covered with a wound VAC.  Specialty Comments:  No specialty comments available.  Imaging: No results found.   PMFS History: Patient Active Problem List    Diagnosis Date Noted   Osteomyelitis of fourth toe of left foot ([place]) 08/13/2021   Osteomyelitis of fifth toe of left foot ([place]) 08/13/2021   Mixed hyperlipidemia 07/31/2021   Paroxysmal A-fib ([place]) 07/30/2021   Medication monitoring encounter 07/28/2021   Carpal tunnel syndrome on right 07/10/2021   Diabetic infection of left foot ([place]) 07/03/2021   CKD (chronic kidney disease) stage 5, GFR less than 15 ml/min ([place]) 07/02/2021   Renal insufficiency 05/20/2021   DM2 (diabetes mellitus, type 2) ([place]) 05/20/2021   Acute ischemic stroke ([place]) 05/20/2021   HTN (hypertension) 05/20/2021   Past Medical History:  Diagnosis Date   DM2 (diabetes mellitus, type 2) ([facility])    HTN (hypertension)    Osteomyelitis of fifth toe of left foot ([place]) 08/13/2021   Osteomyelitis of fourth toe of left foot ([place]) 08/13/2021    Family History  Problem Relation Age of Onset   Stroke Mother    Cancer Mother    Heart disease Father     Past Surgical History:  Procedure Laterality Date   APPLICATION OF WOUND VAC Left 07/07/2021   Procedure: APPLICATION OF WOUND VAC;  Surgeon: Leandrew Koyanagi, MD;  Location: [place];  Service: Orthopedics;  Laterality: Left;   I & D EXTREMITY Left 07/03/2021   Procedure: IRRIGATION AND DEBRIDEMENT ,FIFTH RAY  AMPUTATION LEFT  FOOT, , WOUND VAC PLACEMENT;  Surgeon: Leandrew Koyanagi, MD;  Location: [place];  Service: Orthopedics;  Laterality: Left;   I & D EXTREMITY Left 07/07/2021   Procedure: IRRIGATION AND DEBRIDEMENT LEFT FOOT;  Surgeon: Leandrew Koyanagi, MD;  Location: [place];  Service: Orthopedics;  Laterality: Left;   IR FLUORO GUIDE CV LINE RIGHT  07/09/2021   IR US GUIDE VASC ACCESS RIGHT  07/09/2021   Social History   Occupational History   Not on file  Tobacco Use   Smoking status: Never   Smokeless tobacco: Never  Vaping Use   Vaping Use: Never used  Substance and Sexual Activity   Alcohol use: Yes    Comment: rare   Drug use: Never   Sexual activity: Not on file

## 2021-08-27 ENCOUNTER — Encounter: Payer: Self-pay | Admitting: Infectious Disease

## 2021-08-27 ENCOUNTER — Ambulatory Visit (INDEPENDENT_AMBULATORY_CARE_PROVIDER_SITE_OTHER): Payer: BC Managed Care – PPO

## 2021-08-27 ENCOUNTER — Other Ambulatory Visit (HOSPITAL_COMMUNITY)
Admission: RE | Admit: 2021-08-27 | Discharge: 2021-08-27 | Disposition: A | Discharge status: Home / Self Care | Payer: BC Managed Care – PPO | Source: Ambulatory Visit | Attending: Orthopaedic Surgery | Admitting: Orthopaedic Surgery

## 2021-08-27 ENCOUNTER — Ambulatory Visit (INDEPENDENT_AMBULATORY_CARE_PROVIDER_SITE_OTHER): Payer: BC Managed Care – PPO | Admitting: Infectious Disease

## 2021-08-27 ENCOUNTER — Other Ambulatory Visit: Payer: Self-pay

## 2021-08-27 VITALS — BP 169/93 | HR 95 | Temp 97.7°F | Ht 74.0 in | Wt 213.0 lb

## 2021-08-27 DIAGNOSIS — A499 Bacterial infection, unspecified: Secondary | ICD-10-CM

## 2021-08-27 DIAGNOSIS — Z01812 Encounter for preprocedural laboratory examination: Secondary | ICD-10-CM | POA: Insufficient documentation

## 2021-08-27 DIAGNOSIS — E11628 Type 2 diabetes mellitus with other skin complications: Secondary | ICD-10-CM

## 2021-08-27 DIAGNOSIS — Z23 Encounter for immunization: Secondary | ICD-10-CM

## 2021-08-27 DIAGNOSIS — I10 Essential (primary) hypertension: Secondary | ICD-10-CM

## 2021-08-27 DIAGNOSIS — M869 Osteomyelitis, unspecified: Secondary | ICD-10-CM

## 2021-08-27 DIAGNOSIS — Z7185 Encounter for immunization safety counseling: Secondary | ICD-10-CM

## 2021-08-27 DIAGNOSIS — L089 Local infection of the skin and subcutaneous tissue, unspecified: Secondary | ICD-10-CM

## 2021-08-27 DIAGNOSIS — N185 Chronic kidney disease, stage 5: Secondary | ICD-10-CM

## 2021-08-27 DIAGNOSIS — Z01818 Encounter for other preprocedural examination: Secondary | ICD-10-CM

## 2021-08-27 DIAGNOSIS — Z20822 Contact with and (suspected) exposure to covid-19: Secondary | ICD-10-CM | POA: Insufficient documentation

## 2021-08-27 LAB — SARS CORONAVIRUS 2 (TAT 6-24 HRS): SARS Coronavirus 2: NEGATIVE

## 2021-08-27 NOTE — Progress Notes (Signed)
Subjective:   Chief Complaint: followup for Diabetic foot ulcer with osteomyelitis   Patient ID: Bruce Little, male    DOB: [birth year], 50 y.o.   MRN: [id number]  HPI  Bruce Little is a 50 year old Caucasian man with a past medical history significant for diabetes mellitus mellitus with neuropathy, chronic kidney disease stage V, hypertension hyperlipidemia paroxysmal atrial fibrillation who developed a diabetic foot infection with osteomyelitis involving his fifth toe and also possibly the fourth toe.  He had initially presented to the hospital August 24 with worsening of necrotizing infection in his foot.  He had an MRI performed which showed evidence of osteomyelitis of the fifth metatarsal head and then possible changes consistent with osteomyelitis of the fourth metatarsal head.  He had initially been started on vancomycin and Zosyn.  He was taken to the operating room by Dr. Erlinda Hong in August 25th , 2022 and performed irrigation and debridement of the foot with fifth ray amputation and placement of wound vacuum.  Cultures in the operating room yielded methicillin sensitive Staph aureus and Bacteroides.  He then went back to the operating room on the 29th and underwent further irrigation and debridement with revision of the amputation of the left fifth metatarsal and iceman of wound vacuum.  He was then placed on cefazolin and metronidazole after culture data came back and we had seen him formally the initial 3 weeks of cefazolin along with oral metronidazole.  He was seen in follow-up by my partner Dr. West Bali and she was happy with his progress but wanted check a sed rate and CRP prior to changing him to oral antibiotics.  His sedimentation rate came back in the 40s and CRP was also elevated and his cefazolin was extended along with metronidazole.  He was scheduled today in clinic with me as there was no follow-up appointment available for him to see Dr. Leighton Parody yet.    I reviewed his  labs from home health at his visit with me and unfortunately his sedimentation rate has dramatically increased going from 46-102.  The repeat had normalized at 2 having been 6 already last time it was checked.  He does not have any autoimmune conditions or inflammatory arthritis or history of gout or something else to explain his elevated inflammatory markers.  I was  obviously concerned about the fourth toe as there was suggestion of osteomyelitis there and that could be a reason why he is continue to have elevated inflammatory markers.  Obtained an x-ray of the foot which I have personally reviewed from August 14, 2021 which showed the interval amputation of fourth and fifth toes as well as fourth and fifth distal metatarsals with some new cortical irregularity on the lateral margin of the distal third metatarsal.  We subsequently ordered an MRI of the foot which was performed on the 10th 2022 which have personally reviewed.  This scan showed postoperative changes in the fifth ray resection site of the fifth metatarsal with osteomyelitis seen in the residual fifth metatarsal that seems acute by imaging as well as some acute osteomyelitis in the fourth metatarsal and base of the fourth toe proximal phalanx with a complex MTP joint effusion and bone marrow edema in the third metatarsal head and third toe proximal phalanx.  I got in touch with Dr. Erlinda Hong and we discussed the case. He brought [facility] to clinic yesterday and has scheduled patient for surgery with potential for irrigation debridement likely amputation of the fourth toe and possible fourth ray  amputation.  There is a possibility that the foot is not salvageable and he might require either a Lisfranc or more proximal amputation.  Colitis still having a great deal of difficulty processing all this.  On the one hand he does not want to ultimately have a below the knee amputation and lose his leg but on the other hand he does not want to have  infections that continue to smolder in his foot.        Past Medical History:  Diagnosis Date   DM2 (diabetes mellitus, type 2) ([place])    HTN (hypertension)    Osteomyelitis of fifth toe of left foot ([place]) 08/13/2021   Osteomyelitis of fourth toe of left foot ([place]) 08/13/2021    Past Surgical History:  Procedure Laterality Date   APPLICATION OF WOUND VAC Left 07/07/2021   Procedure: APPLICATION OF WOUND VAC;  Surgeon: Leandrew Koyanagi, MD;  Location: [place];  Service: Orthopedics;  Laterality: Left;   I & D EXTREMITY Left 07/03/2021   Procedure: IRRIGATION AND DEBRIDEMENT ,FIFTH RAY  AMPUTATION LEFT FOOT, , WOUND VAC PLACEMENT;  Surgeon: Leandrew Koyanagi, MD;  Location: [place];  Service: Orthopedics;  Laterality: Left;   I & D EXTREMITY Left 07/07/2021   Procedure: IRRIGATION AND DEBRIDEMENT LEFT FOOT;  Surgeon: Leandrew Koyanagi, MD;  Location: [place];  Service: Orthopedics;  Laterality: Left;   IR FLUORO GUIDE CV LINE RIGHT  07/09/2021   IR US GUIDE VASC ACCESS RIGHT  07/09/2021    Family History  Problem Relation Age of Onset   Stroke Mother    Cancer Mother    Heart disease Father       Social History   Socioeconomic History   Marital status: Married    Spouse name: Bruce Little   Number of children: Not on file   Years of education: Not on file   Highest education level: Not on file  Occupational History   Not on file  Tobacco Use   Smoking status: Never   Smokeless tobacco: Never  Vaping Use   Vaping Use: Never used  Substance and Sexual Activity   Alcohol use: Yes    Comment: rare   Drug use: Never   Sexual activity: Not on file  Other Topics Concern   Not on file  Social History Narrative   Not on file   Social Determinants of Health   Financial Resource Strain: Not on file  Food Insecurity: Not on file  Transportation Needs: Not on file  Physical Activity: Not on file  Stress: Not on file  Social Connections: Not on file    No Known Allergies   Current  Outpatient Medications:    acetaminophen (TYLENOL) 325 MG tablet, Take 2 tablets (650 mg total) by mouth every 4 (four) hours as needed for mild pain (or temp > 37.5 C (99.5 F))., Disp: 30 tablet, Rfl: 0   amLODipine (NORVASC) 10 MG tablet, Take 1 tablet (10 mg total) by mouth daily., Disp: 30 tablet, Rfl: 0   apixaban (ELIQUIS) 5 MG TABS tablet, Take 1 tablet (5 mg total) by mouth 2 (two) times daily., Disp: 60 tablet, Rfl: 0   atorvastatin (LIPITOR) 80 MG tablet, Take 1 tablet (80 mg total) by mouth every evening., Disp: 90 tablet, Rfl: 3   dorzolamide-timolol (COSOPT) 22.3-6.8 MG/ML ophthalmic solution, Place 1 drop into the left eye every 6 (six) hours., Disp: , Rfl:    glipiZIDE (GLUCOTROL) 5 MG tablet, Take 1 tablet (5  mg total) by mouth daily., Disp: 30 tablet, Rfl: 0   hydrOXYzine (ATARAX/VISTARIL) 25 MG tablet, Take 25 mg by mouth every evening., Disp: , Rfl:    loratadine (CLARITIN) 10 MG tablet, Take 10 mg by mouth daily., Disp: , Rfl:    metoprolol tartrate (LOPRESSOR) 100 MG tablet, Take 1 tablet (100 mg total) by mouth 2 (two) times daily., Disp: 180 tablet, Rfl: 3   metroNIDAZOLE (FLAGYL) 500 MG tablet, Take 1 tablet (500 mg total) by mouth 2 (two) times daily for 21 days., Disp: 42 tablet, Rfl: 0   multivitamin (ONE-A-DAY MEN'S) TABS tablet, Take 1 tablet by mouth daily., Disp: , Rfl:    oxyCODONE (OXY IR/ROXICODONE) 5 MG immediate release tablet, Take 1 tablet (5 mg total) by mouth every 6 (six) hours as needed for severe pain., Disp: 15 tablet, Rfl: 0   pantoprazole (PROTONIX) 40 MG tablet, Take 1 tablet (40 mg total) by mouth daily., Disp: 30 tablet, Rfl: 0   prednisoLONE acetate (PRED FORTE) 1 % ophthalmic suspension, Place 1 drop into the left eye every 6 (six) hours., Disp: , Rfl:    sodium bicarbonate 650 MG tablet, Take 1 tablet (650 mg total) by mouth 2 (two) times daily. (Patient not taking: No sig reported), Disp: 60 tablet, Rfl: 0   traMADol (ULTRAM) 50 MG tablet, Take 1-2  tablets (50-100 mg total) by mouth daily as needed. (Patient taking differently: Take 50 mg by mouth daily as needed for moderate pain.), Disp: 30 tablet, Rfl: 0   VICTOZA 18 MG/3ML SOPN, Inject 1.8 mg into the skin daily., Disp: , Rfl:    zolpidem (AMBIEN) 10 MG tablet, Take 10 mg by mouth at bedtime as needed for sleep., Disp: , Rfl:    Review of Systems  Constitutional:  Negative for activity change, appetite change, chills, diaphoresis, fatigue, fever and unexpected weight change.  HENT:  Negative for congestion, rhinorrhea, sinus pressure, sneezing, sore throat and trouble swallowing.   Eyes:  Negative for photophobia and visual disturbance.  Respiratory:  Negative for cough, chest tightness, shortness of breath, wheezing and stridor.   Cardiovascular:  Negative for chest pain, palpitations and leg swelling.  Gastrointestinal:  Negative for abdominal distention, abdominal pain, anal bleeding, blood in stool, constipation, diarrhea, nausea and vomiting.  Genitourinary:  Negative for difficulty urinating, dysuria, flank pain and hematuria.  Musculoskeletal:  Negative for arthralgias, back pain, gait problem, joint swelling and myalgias.  Skin:  Positive for wound. Negative for color change, pallor and rash.  Neurological:  Negative for dizziness, tremors, weakness and light-headedness.  Hematological:  Negative for adenopathy. Does not bruise/bleed easily.  Psychiatric/Behavioral:  Positive for dysphoric mood. Negative for agitation, behavioral problems, confusion, decreased concentration and sleep disturbance. The patient is nervous/anxious.       Objective:   Physical Exam Constitutional:      Appearance: He is well-developed.  HENT:     Head: Normocephalic and atraumatic.  Eyes:     Conjunctiva/sclera: Conjunctivae normal.  Cardiovascular:     Rate and Rhythm: Normal rate and regular rhythm.  Pulmonary:     Effort: Pulmonary effort is normal. No respiratory distress.     Breath  sounds: No wheezing.  Abdominal:     General: There is no distension.     Palpations: Abdomen is soft.  Musculoskeletal:        General: No tenderness. Normal range of motion.     Cervical back: Normal range of motion and neck supple.  Skin:  General: Skin is warm and dry.     Coloration: Skin is not pale.     Findings: No erythema or rash.  Neurological:     General: No focal deficit present.     Mental Status: He is alert and oriented to person, place, and time.  Psychiatric:        Attention and Perception: Attention and perception normal.        Mood and Affect: Mood is anxious.        Behavior: Behavior normal.        Thought Content: Thought content normal.        Cognition and Memory: Cognition and memory normal.        Judgment: Judgment normal.    Tunneled catheter 08/13/2021:     Tunneled catheter was cleaned today as well.    Assessment & Plan:  Diabetic foot ulcer with osteomyelitis that was polymicrobial with methicillin sensitive Staph aureus and Bacteroides isolated on culture who unfortunately has involvement also of his fourth Tarsal as well as base of the proximal phalanx acute osteomyelitis of the residual fifth metatarsal and what appears to be osteomyelitis involving third metatarsal head and third toe proximal phalanx  He is scheduled for surgery on Friday with Dr. Erlinda Hong  He asked me what I would personally do if I were in his situation. I told him in my case I would likely proceed with curative BKA given my desire to not have risk of lingering infection  If he does not have BKA but has more distal amputation would like fresh cultures from OR to see if we need to broaden therapy and I would restart the clock on his antibiotic therapy.  CKD stage V: not yet needing HD. Certainly he ultimately wanted to get a transplanted kidney it would be more ideal if he were clearly free of any deep infection.  Diabetes mellitus: Followed by PCP on oral  medications.  I spent 42 minutes with the patient including face to face counseling of the patient and his wife regarding the nature of osteomyelitis and how it is managed with point both surgical typically and IV antibiotic therapy, the different surgical options that are being considered personally reviewing plain films of the foot MRI of the foot as described above along with review of medical records before and during the visit and in coordination of his care with Dr. Erlinda Hong.  I will ask my partner on call to check on him postop Friday vs Saturday and visit him on Monday myself.

## 2021-08-27 NOTE — Progress Notes (Signed)
   Covid-19 Vaccination Clinic  Name:  Bruce Little    MRN: [id number] DOB: 1971-08-11  08/27/2021  [name] was observed post Covid-19 immunization for 15 minutes without incident. He was provided with Vaccine Information Sheet and instruction to access the V-Safe system.   [name] was instructed to call 911 with any severe reactions post vaccine: Difficulty breathing  Swelling of face and throat  A fast heartbeat  A bad rash all over body  Dizziness and weakness   Beryle Flock, RN

## 2021-08-28 ENCOUNTER — Encounter (HOSPITAL_COMMUNITY): Payer: Self-pay | Admitting: Orthopaedic Surgery

## 2021-08-28 ENCOUNTER — Other Ambulatory Visit: Payer: Self-pay

## 2021-08-28 NOTE — Progress Notes (Addendum)
Mr. Arnott denies chest pain or shortness of breath. Patient denies having any s/s of Covid in his household.  Patient denies any known exposure to Covid.   Mr. Putman has type II diabetes, patient reports that CBG runs 120-130, I instructed patient to not take Glipizide or Victoza in AM.  I instructed patient to check CBG after awaking and every 2 hours until arrival  to the hospital.  I Instructed patient if CBG is less than 70 to take 4 Glucose Tablets or 1 tube of Glucose Gel or 1/2 cup of a clear juice. Recheck CBG in 15 minutes if CBG is not over 70 call, pre- op desk at [phone] for further instructions.   Mr. Ernandez has kidney disease, he is seen by [facility].  I instructed patient to shower with antibiotic soap, if it is available.  Dry off with a clean towel. Do not put lotion, powder, cologne or deodorant or makeup.No jewelry or piercings. Men may shave their face and neck. Woman should not shave. No nail polish, artificial or acrylic nails. Wear clean clothes, brush your teeth. Glasses, contact lens,dentures or partials may not be worn in the OR. If you need to wear them, please bring a case for glasses, do not wear contacts or bring a case, the hospital does not have contact cases, dentures or partials will have to be removed , make sure they are clean, we will provide a denture cup to put them in. You will need some one to drive you home and a responsible person over the age of 23 to stay with you for the first 24 hours after surgery.

## 2021-08-29 ENCOUNTER — Encounter (HOSPITAL_COMMUNITY): Payer: Self-pay | Admitting: Orthopaedic Surgery

## 2021-08-29 ENCOUNTER — Encounter (HOSPITAL_COMMUNITY)
Admission: RE | Disposition: A | Discharge status: Home / Self Care | Payer: Self-pay | Source: Ambulatory Visit | Attending: Orthopaedic Surgery

## 2021-08-29 ENCOUNTER — Inpatient Hospital Stay (HOSPITAL_COMMUNITY)
Admission: RE | Admit: 2021-08-29 | Discharge: 2021-08-30 | DRG: 617 | Disposition: A | Discharge status: Home / Self Care | Payer: BC Managed Care – PPO | Source: Ambulatory Visit | Attending: Orthopaedic Surgery | Admitting: Orthopaedic Surgery

## 2021-08-29 ENCOUNTER — Inpatient Hospital Stay (HOSPITAL_COMMUNITY): Payer: BC Managed Care – PPO | Admitting: Certified Registered"

## 2021-08-29 ENCOUNTER — Other Ambulatory Visit: Payer: Self-pay

## 2021-08-29 DIAGNOSIS — Z823 Family history of stroke: Secondary | ICD-10-CM | POA: Diagnosis not present

## 2021-08-29 DIAGNOSIS — I4891 Unspecified atrial fibrillation: Secondary | ICD-10-CM | POA: Diagnosis present

## 2021-08-29 DIAGNOSIS — Z79899 Other long term (current) drug therapy: Secondary | ICD-10-CM

## 2021-08-29 DIAGNOSIS — Z7985 Long-term (current) use of injectable non-insulin antidiabetic drugs: Secondary | ICD-10-CM | POA: Diagnosis not present

## 2021-08-29 DIAGNOSIS — M86172 Other acute osteomyelitis, left ankle and foot: Secondary | ICD-10-CM | POA: Diagnosis present

## 2021-08-29 DIAGNOSIS — I10 Essential (primary) hypertension: Secondary | ICD-10-CM | POA: Diagnosis present

## 2021-08-29 DIAGNOSIS — Z7984 Long term (current) use of oral hypoglycemic drugs: Secondary | ICD-10-CM | POA: Diagnosis not present

## 2021-08-29 DIAGNOSIS — Z8249 Family history of ischemic heart disease and other diseases of the circulatory system: Secondary | ICD-10-CM | POA: Diagnosis not present

## 2021-08-29 DIAGNOSIS — E11628 Type 2 diabetes mellitus with other skin complications: Secondary | ICD-10-CM | POA: Diagnosis not present

## 2021-08-29 DIAGNOSIS — D62 Acute posthemorrhagic anemia: Secondary | ICD-10-CM | POA: Diagnosis not present

## 2021-08-29 DIAGNOSIS — L089 Local infection of the skin and subcutaneous tissue, unspecified: Secondary | ICD-10-CM | POA: Diagnosis not present

## 2021-08-29 DIAGNOSIS — Z8673 Personal history of transient ischemic attack (TIA), and cerebral infarction without residual deficits: Secondary | ICD-10-CM | POA: Diagnosis not present

## 2021-08-29 DIAGNOSIS — Z7901 Long term (current) use of anticoagulants: Secondary | ICD-10-CM | POA: Diagnosis not present

## 2021-08-29 DIAGNOSIS — Z79891 Long term (current) use of opiate analgesic: Secondary | ICD-10-CM | POA: Diagnosis not present

## 2021-08-29 DIAGNOSIS — Z20822 Contact with and (suspected) exposure to covid-19: Secondary | ICD-10-CM | POA: Diagnosis present

## 2021-08-29 DIAGNOSIS — E1169 Type 2 diabetes mellitus with other specified complication: Secondary | ICD-10-CM | POA: Diagnosis present

## 2021-08-29 HISTORY — DX: Chronic kidney disease, unspecified: N18.9

## 2021-08-29 HISTORY — DX: Unspecified atrial fibrillation: I48.91

## 2021-08-29 HISTORY — DX: Personal history of other medical treatment: Z92.89

## 2021-08-29 HISTORY — PX: AMPUTATION: SHX166

## 2021-08-29 HISTORY — PX: I & D EXTREMITY: SHX5045

## 2021-08-29 HISTORY — PX: APPLICATION OF WOUND VAC: SHX5189

## 2021-08-29 LAB — POCT I-STAT, CHEM 8
BUN: 52 mg/dL — ABNORMAL HIGH (ref 6–20)
Calcium, Ion: 1.25 mmol/L (ref 1.15–1.40)
Chloride: 104 mmol/L (ref 98–111)
Creatinine, Ser: 3.8 mg/dL — ABNORMAL HIGH (ref 0.61–1.24)
Glucose, Bld: 176 mg/dL — ABNORMAL HIGH (ref 70–99)
HCT: 30 % — ABNORMAL LOW (ref 39.0–52.0)
Hemoglobin: 10.2 g/dL — ABNORMAL LOW (ref 13.0–17.0)
Potassium: 4.5 mmol/L (ref 3.5–5.1)
Sodium: 134 mmol/L — ABNORMAL LOW (ref 135–145)
TCO2: 21 mmol/L — ABNORMAL LOW (ref 22–32)

## 2021-08-29 LAB — GLUCOSE, CAPILLARY
Glucose-Capillary: 172 mg/dL — ABNORMAL HIGH (ref 70–99)
Glucose-Capillary: 159 mg/dL — ABNORMAL HIGH (ref 70–99)
Glucose-Capillary: 146 mg/dL — ABNORMAL HIGH (ref 70–99)
Glucose-Capillary: 156 mg/dL — ABNORMAL HIGH (ref 70–99)

## 2021-08-29 SURGERY — IRRIGATION AND DEBRIDEMENT EXTREMITY
Anesthesia: General | Site: Foot | Laterality: Left

## 2021-08-29 MED ORDER — SODIUM CHLORIDE 0.9 % IV SOLN
2.0000 g | INTRAVENOUS | Status: DC
  Administered 2021-08-29: 2 g via INTRAVENOUS
  Filled 2021-08-29: qty 20

## 2021-08-29 MED ORDER — VANCOMYCIN HCL 1000 MG IV SOLR
INTRAVENOUS | Status: DC | PRN
  Administered 2021-08-29: 1000 mg via TOPICAL

## 2021-08-29 MED ORDER — METRONIDAZOLE 500 MG PO TABS
500.0000 mg | ORAL_TABLET | Freq: Two times a day (BID) | ORAL | Status: DC
  Administered 2021-08-29 – 2021-08-30 (×2): 500 mg via ORAL
  Filled 2021-08-29 (×3): qty 1

## 2021-08-29 MED ORDER — ACETAMINOPHEN 500 MG PO TABS
ORAL_TABLET | ORAL | Status: AC
  Filled 2021-08-29: qty 1

## 2021-08-29 MED ORDER — FENTANYL CITRATE (PF) 250 MCG/5ML IJ SOLN
INTRAMUSCULAR | Status: AC
  Filled 2021-08-29: qty 5

## 2021-08-29 MED ORDER — SODIUM CHLORIDE 0.9% FLUSH: 10.0000 mL | INTRAVENOUS | Status: DC | PRN

## 2021-08-29 MED ORDER — DORZOLAMIDE HCL-TIMOLOL MAL 2-0.5 % OP SOLN
1.0000 [drp] | Freq: Four times a day (QID) | OPHTHALMIC | Status: DC
  Administered 2021-08-29: 1 [drp] via OPHTHALMIC

## 2021-08-29 MED ORDER — LIRAGLUTIDE 18 MG/3ML ~~LOC~~ SOPN: 1.8000 mg | PEN_INJECTOR | Freq: Every day | SUBCUTANEOUS | Status: DC

## 2021-08-29 MED ORDER — OXYCODONE HCL 5 MG PO TABS: 10.0000 mg | ORAL_TABLET | ORAL | Status: DC | PRN

## 2021-08-29 MED ORDER — INSULIN ASPART 100 UNIT/ML IJ SOLN: 0.0000 [IU] | Freq: Three times a day (TID) | INTRAMUSCULAR | Status: DC

## 2021-08-29 MED ORDER — CEFAZOLIN SODIUM-DEXTROSE 2-3 GM-%(50ML) IV SOLR
INTRAVENOUS | Status: DC | PRN
  Administered 2021-08-29: 2 g via INTRAVENOUS

## 2021-08-29 MED ORDER — SODIUM CHLORIDE 0.9 % IV SOLN: INTRAVENOUS | Status: DC

## 2021-08-29 MED ORDER — ORAL CARE MOUTH RINSE: 15.0000 mL | Freq: Once | OROMUCOSAL | Status: AC

## 2021-08-29 MED ORDER — METOCLOPRAMIDE HCL 5 MG PO TABS: 5.0000 mg | ORAL_TABLET | Freq: Three times a day (TID) | ORAL | Status: DC | PRN

## 2021-08-29 MED ORDER — HYDROMORPHONE HCL 1 MG/ML IJ SOLN: 0.5000 mg | INTRAMUSCULAR | Status: DC | PRN

## 2021-08-29 MED ORDER — CHLORHEXIDINE GLUCONATE CLOTH 2 % EX PADS
6.0000 | MEDICATED_PAD | Freq: Every day | CUTANEOUS | Status: DC
  Administered 2021-08-30: 6 via TOPICAL

## 2021-08-29 MED ORDER — LIDOCAINE 2% (20 MG/ML) 5 ML SYRINGE
INTRAMUSCULAR | Status: DC | PRN
  Administered 2021-08-29: 100 mg via INTRAVENOUS

## 2021-08-29 MED ORDER — PREDNISOLONE ACETATE 1 % OP SUSP
1.0000 [drp] | Freq: Three times a day (TID) | OPHTHALMIC | Status: DC
  Administered 2021-08-29 – 2021-08-30 (×2): 1 [drp] via OPHTHALMIC
  Filled 2021-08-29: qty 5

## 2021-08-29 MED ORDER — FENTANYL CITRATE (PF) 250 MCG/5ML IJ SOLN
INTRAMUSCULAR | Status: DC | PRN
  Administered 2021-08-29: 100 ug via INTRAVENOUS

## 2021-08-29 MED ORDER — 0.9 % SODIUM CHLORIDE (POUR BTL) OPTIME
TOPICAL | Status: DC | PRN
Start: 2021-08-29 — End: 2021-08-29
  Administered 2021-08-29: 1000 mL

## 2021-08-29 MED ORDER — SODIUM CHLORIDE 0.9% FLUSH
10.0000 mL | Freq: Two times a day (BID) | INTRAVENOUS | Status: DC
Start: 2021-08-29 — End: 2021-08-30
  Administered 2021-08-30: 10 mL

## 2021-08-29 MED ORDER — LACTATED RINGERS IV SOLN: INTRAVENOUS | Status: DC

## 2021-08-29 MED ORDER — CEFAZOLIN SODIUM-DEXTROSE 2-4 GM/100ML-% IV SOLN
INTRAVENOUS | Status: AC
  Filled 2021-08-29: qty 100

## 2021-08-29 MED ORDER — POLYETHYLENE GLYCOL 3350 17 G PO PACK: 17.0000 g | PACK | Freq: Every day | ORAL | Status: DC | PRN

## 2021-08-29 MED ORDER — ENOXAPARIN SODIUM 30 MG/0.3ML IJ SOSY: 30.0000 mg | PREFILLED_SYRINGE | INTRAMUSCULAR | Status: DC

## 2021-08-29 MED ORDER — ACETAMINOPHEN 500 MG PO TABS
1000.0000 mg | ORAL_TABLET | Freq: Four times a day (QID) | ORAL | Status: AC
  Administered 2021-08-29 – 2021-08-30 (×4): 1000 mg via ORAL
  Filled 2021-08-29 (×3): qty 2

## 2021-08-29 MED ORDER — ONDANSETRON HCL 4 MG/2ML IJ SOLN
INTRAMUSCULAR | Status: DC | PRN
  Administered 2021-08-29: 4 mg via INTRAVENOUS

## 2021-08-29 MED ORDER — METOCLOPRAMIDE HCL 5 MG/ML IJ SOLN: 5.0000 mg | Freq: Three times a day (TID) | INTRAMUSCULAR | Status: DC | PRN

## 2021-08-29 MED ORDER — OXYCODONE HCL 5 MG PO TABS
5.0000 mg | ORAL_TABLET | ORAL | Status: DC | PRN
  Administered 2021-08-29 – 2021-08-30 (×2): 10 mg via ORAL
  Filled 2021-08-29 (×2): qty 2

## 2021-08-29 MED ORDER — INSULIN ASPART 100 UNIT/ML IJ SOLN: 0.0000 [IU] | Freq: Every day | INTRAMUSCULAR | Status: DC

## 2021-08-29 MED ORDER — ONDANSETRON HCL 4 MG PO TABS: 4.0000 mg | ORAL_TABLET | Freq: Four times a day (QID) | ORAL | Status: DC | PRN

## 2021-08-29 MED ORDER — DOCUSATE SODIUM 100 MG PO CAPS
100.0000 mg | ORAL_CAPSULE | Freq: Two times a day (BID) | ORAL | Status: DC
  Administered 2021-08-29 – 2021-08-30 (×2): 100 mg via ORAL
  Filled 2021-08-29 (×2): qty 1

## 2021-08-29 MED ORDER — ENOXAPARIN SODIUM 40 MG/0.4ML IJ SOSY: 40.0000 mg | PREFILLED_SYRINGE | INTRAMUSCULAR | Status: DC

## 2021-08-29 MED ORDER — PROPOFOL 10 MG/ML IV BOLUS
INTRAVENOUS | Status: DC | PRN
  Administered 2021-08-29: 200 mg via INTRAVENOUS

## 2021-08-29 MED ORDER — METHOCARBAMOL 1000 MG/10ML IJ SOLN
500.0000 mg | Freq: Four times a day (QID) | INTRAVENOUS | Status: DC | PRN
  Filled 2021-08-29: qty 5

## 2021-08-29 MED ORDER — METHOCARBAMOL 500 MG PO TABS
500.0000 mg | ORAL_TABLET | Freq: Four times a day (QID) | ORAL | Status: DC | PRN
  Administered 2021-08-29: 500 mg via ORAL
  Filled 2021-08-29: qty 1

## 2021-08-29 MED ORDER — PHENYLEPHRINE HCL (PRESSORS) 10 MG/ML IV SOLN
INTRAVENOUS | Status: DC | PRN
  Administered 2021-08-29 (×3): 80 ug via INTRAVENOUS

## 2021-08-29 MED ORDER — GLIPIZIDE 5 MG PO TABS
5.0000 mg | ORAL_TABLET | Freq: Every day | ORAL | Status: DC
  Administered 2021-08-29 – 2021-08-30 (×2): 5 mg via ORAL
  Filled 2021-08-29 (×2): qty 1

## 2021-08-29 MED ORDER — DORZOLAMIDE HCL-TIMOLOL MAL 2-0.5 % OP SOLN
1.0000 [drp] | Freq: Two times a day (BID) | OPHTHALMIC | Status: DC
  Administered 2021-08-30: 1 [drp] via OPHTHALMIC
  Filled 2021-08-29: qty 10

## 2021-08-29 MED ORDER — VANCOMYCIN HCL 1000 MG IV SOLR
INTRAVENOUS | Status: AC
  Filled 2021-08-29: qty 20

## 2021-08-29 MED ORDER — SODIUM CHLORIDE 0.9% FLUSH
10.0000 mL | Freq: Two times a day (BID) | INTRAVENOUS | Status: DC
  Administered 2021-08-30: 10 mL

## 2021-08-29 MED ORDER — SORBITOL 70 % SOLN
30.0000 mL | Freq: Every day | Status: DC | PRN
  Filled 2021-08-29: qty 30

## 2021-08-29 MED ORDER — ACETAMINOPHEN 325 MG PO TABS: 325.0000 mg | ORAL_TABLET | Freq: Four times a day (QID) | ORAL | Status: DC | PRN

## 2021-08-29 MED ORDER — PROPOFOL 10 MG/ML IV BOLUS
INTRAVENOUS | Status: AC
  Filled 2021-08-29: qty 20

## 2021-08-29 MED ORDER — CEFAZOLIN SODIUM-DEXTROSE 2-4 GM/100ML-% IV SOLN: 2.0000 g | INTRAVENOUS | Status: DC

## 2021-08-29 MED ORDER — DIPHENHYDRAMINE HCL 12.5 MG/5ML PO ELIX: 25.0000 mg | ORAL_SOLUTION | ORAL | Status: DC | PRN

## 2021-08-29 MED ORDER — CHLORHEXIDINE GLUCONATE 0.12 % MT SOLN: 15.0000 mL | Freq: Once | OROMUCOSAL | Status: AC

## 2021-08-29 MED ORDER — CHLORHEXIDINE GLUCONATE 0.12 % MT SOLN
OROMUCOSAL | Status: AC
  Administered 2021-08-29: 15 mL via OROMUCOSAL
  Filled 2021-08-29: qty 15

## 2021-08-29 MED ORDER — METOPROLOL TARTRATE 50 MG PO TABS
100.0000 mg | ORAL_TABLET | Freq: Two times a day (BID) | ORAL | Status: DC
  Administered 2021-08-29 – 2021-08-30 (×3): 100 mg via ORAL
  Filled 2021-08-29: qty 2
  Filled 2021-08-29: qty 1
  Filled 2021-08-29: qty 2

## 2021-08-29 MED ORDER — ONDANSETRON HCL 4 MG/2ML IJ SOLN: 4.0000 mg | Freq: Four times a day (QID) | INTRAMUSCULAR | Status: DC | PRN

## 2021-08-29 MED ORDER — SODIUM CHLORIDE 0.9 % IR SOLN
Status: DC | PRN
  Administered 2021-08-29 (×2): 3000 mL

## 2021-08-29 SURGICAL SUPPLY — 50 items
BAG COUNTER SPONGE SURGICOUNT (BAG) ×3 IMPLANT
BLADE SURG 10 STRL SS (BLADE) ×3 IMPLANT
BNDG COHESIVE 4X5 TAN STRL (GAUZE/BANDAGES/DRESSINGS) IMPLANT
BNDG COHESIVE 6X5 TAN STRL LF (GAUZE/BANDAGES/DRESSINGS) IMPLANT
BNDG ELASTIC 3X5.8 VLCR STR LF (GAUZE/BANDAGES/DRESSINGS) IMPLANT
BNDG GAUZE ELAST 4 BULKY (GAUZE/BANDAGES/DRESSINGS) IMPLANT
CANISTER WOUND CARE 500ML ATS (WOUND CARE) ×3 IMPLANT
COVER SURGICAL LIGHT HANDLE (MISCELLANEOUS) ×3 IMPLANT
CUFF TOURN SGL QUICK 24 (TOURNIQUET CUFF)
CUFF TOURN SGL QUICK 34 (TOURNIQUET CUFF) ×1
CUFF TOURN SGL QUICK 42 (TOURNIQUET CUFF) IMPLANT
CUFF TRNQT CYL 24X4X16.5-23 (TOURNIQUET CUFF) IMPLANT
CUFF TRNQT CYL 34X4.125X (TOURNIQUET CUFF) ×2 IMPLANT
DRAPE DERMATAC (DRAPES) ×6 IMPLANT
DRAPE U-SHAPE 47X51 STRL (DRAPES) ×3 IMPLANT
DRSG PAD ABDOMINAL 8X10 ST (GAUZE/BANDAGES/DRESSINGS) IMPLANT
DRSG VAC ATS MED SENSATRAC (GAUZE/BANDAGES/DRESSINGS) ×3 IMPLANT
DURAPREP 26ML APPLICATOR (WOUND CARE) IMPLANT
ELECT REM PT RETURN 9FT ADLT (ELECTROSURGICAL) ×3
ELECTRODE REM PT RTRN 9FT ADLT (ELECTROSURGICAL) ×2 IMPLANT
GAUZE SPONGE 4X4 12PLY STRL (GAUZE/BANDAGES/DRESSINGS) IMPLANT
GAUZE XEROFORM 5X9 LF (GAUZE/BANDAGES/DRESSINGS) ×6 IMPLANT
GLOVE SURG LTX SZ7 (GLOVE) ×3 IMPLANT
GLOVE SURG UNDER POLY LF SZ7 (GLOVE) ×15 IMPLANT
GLOVE SURG UNDER POLY LF SZ7.5 (GLOVE) ×12 IMPLANT
GOWN STRL REIN XL XLG (GOWN DISPOSABLE) ×3 IMPLANT
HANDPIECE INTERPULSE COAX TIP (DISPOSABLE) ×1
IV NS IRRIG 3000ML ARTHROMATIC (IV SOLUTION) ×6 IMPLANT
KIT BASIN OR (CUSTOM PROCEDURE TRAY) ×3 IMPLANT
KIT TURNOVER KIT B (KITS) ×3 IMPLANT
MANIFOLD NEPTUNE II (INSTRUMENTS) ×3 IMPLANT
PACK ORTHO EXTREMITY (CUSTOM PROCEDURE TRAY) ×3 IMPLANT
PAD ARMBOARD 7.5X6 YLW CONV (MISCELLANEOUS) ×6 IMPLANT
PADDING CAST ABS 4INX4YD NS (CAST SUPPLIES)
PADDING CAST ABS COTTON 4X4 ST (CAST SUPPLIES) IMPLANT
PADDING CAST COTTON 6X4 STRL (CAST SUPPLIES) IMPLANT
SET HNDPC FAN SPRY TIP SCT (DISPOSABLE) ×2 IMPLANT
SPONGE T-LAP 18X18 ~~LOC~~+RFID (SPONGE) ×6 IMPLANT
STOCKINETTE IMPERVIOUS 9X36 MD (GAUZE/BANDAGES/DRESSINGS) ×3 IMPLANT
SUT ETHILON 2 0 FS 18 (SUTURE) IMPLANT
SUT ETHILON 2 0 PSLX (SUTURE) IMPLANT
SUT VIC AB 2-0 FS1 27 (SUTURE) IMPLANT
SWAB CULTURE ESWAB REG 1ML (MISCELLANEOUS) IMPLANT
TIP HIGH FLOW IRRIGATION COAX (MISCELLANEOUS) ×3 IMPLANT
TOWEL GREEN STERILE (TOWEL DISPOSABLE) ×3 IMPLANT
TOWEL GREEN STERILE FF (TOWEL DISPOSABLE) ×3 IMPLANT
TUBE CONNECTING 12X1/4 (SUCTIONS) ×3 IMPLANT
UNDERPAD 30X36 HEAVY ABSORB (UNDERPADS AND DIAPERS) ×3 IMPLANT
WATER STERILE IRR 1000ML POUR (IV SOLUTION) IMPLANT
YANKAUER SUCT BULB TIP NO VENT (SUCTIONS) ×3 IMPLANT

## 2021-08-29 NOTE — Transfer of Care (Signed)
Immediate Anesthesia Transfer of Care Note  Patient: Bruce Little  Procedure(s) Performed: IRRIGATION AND DEBRIDEMENT LEFT FOOT (Left: Foot) APPLICATION OF WOUND VAC (Left) AMPUTATION OF FOURTH TOE AND RAY ALONG WITH REMAINING FITH METATARSAL (Left)  Patient Location: PACU  Anesthesia Type:General  Level of Consciousness: awake, alert , oriented and patient cooperative  Airway & Oxygen Therapy: Patient Spontanous Breathing  Post-op Assessment: Report given to RN and Post -op Vital signs reviewed and stable  Post vital signs: Reviewed and stable  Last Vitals:  Vitals Value Taken Time  BP    Temp    Pulse 80 08/29/21 1436  Resp 10 08/29/21 1436  SpO2 99 % 08/29/21 1436  Vitals shown include unvalidated device data.  Last Pain:  Vitals:   08/29/21 1119  TempSrc:   PainSc: 3       Patients Stated Pain Goal: 1 ([id number])  Complications: No notable events documented.

## 2021-08-29 NOTE — Op Note (Signed)
Date of Surgery: 08/29/2021  INDICATIONS: [name] is a 50 y.o.-year-old male with a left diabetic foot infection status post debridement and 5th ray amputation.  Unfortunately, inflammatory markers remain elevated and MRI showed osteomyelitis of the fourth toe and ray and residual fifth metatarsal.  Given the findings surgical debridement was recommended.  The Patient did consent to the procedure after discussion of the risks and benefits.  PREOPERATIVE DIAGNOSIS:  1.  Left fourth toe osteomyelitis 2.  Left fourth metatarsal osteomyelitis 3.  Left fifth metatarsal osteomyelitis 4.  Left foot diabetic infection with open wound  POSTOPERATIVE DIAGNOSIS: Same.  PROCEDURE:  1.  Sharp excisional debridement of left foot wound including bone, muscle, skin, tendon 96 cm 2.  Disarticulation of remaining left fifth metatarsal base 3.  Left fourth toe amputation 4.  Left fourth metatarsal amputation 5.  Application of wound VAC to the left foot greater than 50 cm  Debridement type: Excisional Debridement  Side: left  Body Location: Foot  Tools used for debridement: scalpel and rongeur  SURGEON: N. Eduard Roux, M.D.  ASSIST: Ciro Backer [place], [place]; necessary for the timely completion of procedure and due to complexity of procedure.  ANESTHESIA:  general  IV FLUIDS AND URINE: See anesthesia.  ESTIMATED BLOOD LOSS: 25 mL.  IMPLANTS: None  DRAINS: Wound VAC  COMPLICATIONS: see description of procedure.  DESCRIPTION OF PROCEDURE: The patient was brought to the operating room.  The patient had been signed prior to the procedure and this was documented. The patient had the anesthesia placed by the anesthesiologist.  A time-out was performed to confirm that this was the correct patient, site, side and location. The patient did receive antibiotics prior to the incision and was re-dosed during the procedure as needed at indicated intervals.  A tourniquet was placed.  The patient had  the operative extremity prepped and draped in the standard surgical fashion.    We first began with sharp excisional debridement of the open wound which included the skin and the underlying subcutaneous tissue, muscle, tendon and bone.  This was done with a scalpel.  I extended the wound with the scalpel proximally over the lateral aspect of the fifth metatarsal.  Soft tissues were elevated off the fifth metatarsal.  There was evidence of bony destruction consistent with the MRI findings of osteomyelitis.  The remaining fifth metatarsal base was disarticulated from the cuboid.  We then carried our debridement medially towards the fourth toe and ray.  There was also bony destruction of the base of the fourth proximal phalanx and fourth metatarsal consistent with osteomyelitis.  10 blade was used to amputate the fourth toe along with the associated fourth metatarsal back to the Lisfranc joint.  The surrounding soft tissue was edematous but did not demonstrate obvious infection.  The skin edge was debrided back to healthy borders with punctate bleeding.  Once I was happy with the debridement we irrigated the entire wound with 6 L of normal saline.  The tourniquet was then deflated and the tissues demonstrated excellent perfusion.  Hemostasis was then obtained.  1 g of vancomycin powder was then placed in the wound site.  Wound VAC was then applied to the open wound.  Patient tolerated the procedure well had no many complications.  Bruce Little was necessary for opening, closing, retracting, limb positioning and overall facilitation and timely completion of the procedure.  POSTOPERATIVE PLAN: The patient will need to return back to the operating room for repeat look and likely  definitive Lisfranc amputation.  Bruce Cecil, MD 2:28 PM

## 2021-08-29 NOTE — Anesthesia Preprocedure Evaluation (Signed)
Anesthesia Evaluation  Patient identified by MRN, date of birth, ID band Patient awake    Reviewed: Allergy & Precautions, NPO status , Patient's Chart, lab work & pertinent test results  Airway Mallampati: II  TM Distance: >3 FB Neck ROM: Full    Dental no notable dental hx.    Pulmonary neg pulmonary ROS,    Pulmonary exam normal breath sounds clear to auscultation       Cardiovascular hypertension, Pt. on medications Normal cardiovascular exam Rhythm:Regular Rate:Normal  ECHO: Left ventricular ejection fraction, by estimation, is 55 to 60%. The left ventricle has normal function. The left ventricle has no regional wall motion abnormalities. There is mild left ventricular hypertrophy. Left ventricular diastolic parameters were normal. Right ventricular systolic function is normal. The right ventricular size is normal. Tricuspid regurgitation signal is inadequate for assessing PA pressure. The mitral valve is normal in structure. Mild mitral valve regurgitation. No evidence of mitral stenosis. The aortic valve is grossly normal. Aortic valve regurgitation is trivial. No aortic stenosis is present. The inferior vena cava is normal in size with greater than 50% respiratory variability, suggesting right atrial pressure of 3 mmHg.   Neuro/Psych CVA negative psych ROS   GI/Hepatic negative GI ROS, Neg liver ROS,   Endo/Other  diabetes, Oral Hypoglycemic Agents  Renal/GU CRFRenal disease     Musculoskeletal negative musculoskeletal ROS (+)   Abdominal Normal abdominal exam  (+)   Peds  Hematology  (+) anemia , HLD   Anesthesia Other Findings Left foot infection  Reproductive/Obstetrics                             Anesthesia Physical  Anesthesia Plan  ASA: 3  Anesthesia Plan: General   Post-op Pain Management:    Induction: Intravenous  PONV Risk Score and Plan: 2 and Ondansetron,  Dexamethasone and Treatment may vary due to age or medical condition  Airway Management Planned: LMA  Additional Equipment: None  Intra-op Plan:   Post-operative Plan: Extubation in OR  Informed Consent: I have reviewed the patients History and Physical, chart, labs and discussed the procedure including the risks, benefits and alternatives for the proposed anesthesia with the patient or authorized representative who has indicated his/her understanding and acceptance.     Dental advisory given  Plan Discussed with: CRNA  Anesthesia Plan Comments:         Anesthesia Quick Evaluation

## 2021-08-29 NOTE — H&P (Signed)
PREOPERATIVE H&P  Chief Complaint: left foot infection  HPI: Bruce Little is a 50 y.o. male who presents for surgical treatment of left foot infection.  He denies any changes in medical history.  Past Medical History:  Diagnosis Date   A-fib ([place])    Chronic kidney disease    DM2 (diabetes mellitus, type 2) ([place])    History of blood transfusion    HTN (hypertension)    Osteomyelitis of fifth toe of left foot ([place]) 08/13/2021   Osteomyelitis of fourth toe of left foot ([place]) 08/13/2021   Stroke ([place]) 05/19/2021   no residual effects.   Past Surgical History:  Procedure Laterality Date   APPLICATION OF WOUND VAC Left 07/07/2021   Procedure: APPLICATION OF WOUND VAC;  Surgeon: Leandrew Koyanagi, MD;  Location: [facility];  Service: Orthopedics;  Laterality: Left;   I & D EXTREMITY Left 07/03/2021   Procedure: IRRIGATION AND DEBRIDEMENT ,FIFTH RAY  AMPUTATION LEFT FOOT, , WOUND VAC PLACEMENT;  Surgeon: Leandrew Koyanagi, MD;  Location: [facility];  Service: Orthopedics;  Laterality: Left;   I & D EXTREMITY Left 07/07/2021   Procedure: IRRIGATION AND DEBRIDEMENT LEFT FOOT;  Surgeon: Leandrew Koyanagi, MD;  Location: [facility];  Service: Orthopedics;  Laterality: Left;   IR FLUORO GUIDE CV LINE RIGHT  07/09/2021   IR US GUIDE VASC ACCESS RIGHT  07/09/2021   VITRECTOMY Left    [facility]   Social History   Socioeconomic History   Marital status: Married    Spouse name: Caryl Pina   Number of children: Not on file   Years of education: Not on file   Highest education level: Not on file  Occupational History   Not on file  Tobacco Use   Smoking status: Never   Smokeless tobacco: Never  Vaping Use   Vaping Use: Never used  Substance and Sexual Activity   Alcohol use: Not Currently    Comment: rare   Drug use: Never   Sexual activity: Not on file  Other Topics Concern   Not on file  Social History Narrative   Not on file   Social Determinants of Health   Financial Resource Strain: Not on  file  Food Insecurity: Not on file  Transportation Needs: Not on file  Physical Activity: Not on file  Stress: Not on file  Social Connections: Not on file   Family History  Problem Relation Age of Onset   Stroke Mother    Cancer Mother    Heart disease Father    No Known Allergies Prior to Admission medications   Medication Sig Start Date End Date Taking? Authorizing Provider  acetaminophen (TYLENOL) 325 MG tablet Take 2 tablets (650 mg total) by mouth every 4 (four) hours as needed for mild pain (or temp > 37.5 C (99.5 F)). 05/21/21  Yes Sheikh, Omair Latif, DO  amLODipine (NORVASC) 10 MG tablet Take 1 tablet (10 mg total) by mouth daily. 05/21/21 08/26/21 Yes Sheikh, Omair Latif, DO  apixaban (ELIQUIS) 5 MG TABS tablet Take 1 tablet (5 mg total) by mouth 2 (two) times daily. 07/31/21  Yes Patwardhan, Reynold Bowen, MD  atorvastatin (LIPITOR) 80 MG tablet Take 1 tablet (80 mg total) by mouth every evening. 07/31/21  Yes Patwardhan, Manish J, MD  dorzolamide-timolol (COSOPT) 22.3-6.8 MG/ML ophthalmic solution Place 1 drop into the left eye every 6 (six) hours. 08/24/21  Yes [provider]  glipiZIDE (GLUCOTROL) 5 MG tablet Take 1 tablet (5  mg total) by mouth daily. 05/21/21 05/21/22 Yes Sheikh, Omair Latif, DO  hydrOXYzine (ATARAX/VISTARIL) 25 MG tablet Take 25 mg by mouth every evening. 03/30/21  Yes [provider]  loratadine (CLARITIN) 10 MG tablet Take 10 mg by mouth daily.   Yes [provider]  metoprolol tartrate (LOPRESSOR) 100 MG tablet Take 1 tablet (100 mg total) by mouth 2 (two) times daily. 07/31/21  Yes Patwardhan, Manish J, MD  metroNIDAZOLE (FLAGYL) 500 MG tablet Take 1 tablet (500 mg total) by mouth 2 (two) times daily for 21 days. 08/13/21 09/03/21 Yes Truman Hayward, MD  multivitamin (ONE-A-DAY MEN'S) TABS tablet Take 1 tablet by mouth daily.   Yes [provider]  oxyCODONE (OXY IR/ROXICODONE) 5 MG immediate release tablet Take 1 tablet (5  mg total) by mouth every 6 (six) hours as needed for severe pain. 07/11/21  Yes Thurnell Lose, MD  pantoprazole (PROTONIX) 40 MG tablet Take 1 tablet (40 mg total) by mouth daily. 05/22/21  Yes Sheikh, Omair Latif, DO  prednisoLONE acetate (PRED FORTE) 1 % ophthalmic suspension Place 1 drop into the left eye every 6 (six) hours. 08/15/21  Yes [provider]  traMADol (ULTRAM) 50 MG tablet Take 1-2 tablets (50-100 mg total) by mouth daily as needed. Patient taking differently: Take 50 mg by mouth daily as needed for moderate pain. 08/12/21  Yes Leandrew Koyanagi, MD  VICTOZA 18 MG/3ML SOPN Inject 1.8 mg into the skin daily. 05/07/21  Yes [provider]  zolpidem (AMBIEN) 10 MG tablet Take 10 mg by mouth at bedtime as needed for sleep.   Yes [provider]  sodium bicarbonate 650 MG tablet Take 1 tablet (650 mg total) by mouth 2 (two) times daily. Patient not taking: No sig reported 07/11/21   Thurnell Lose, MD     Positive ROS: All other systems have been reviewed and were otherwise negative with the exception of those mentioned in the HPI and as above.  Physical Exam: General: Alert, no acute distress Cardiovascular: No pedal edema Respiratory: No cyanosis, no use of accessory musculature GI: abdomen soft Skin: No lesions in the area of chief complaint Neurologic: Sensation intact distally Psychiatric: Patient is competent for consent with normal mood and affect Lymphatic: no lymphedema  MUSCULOSKELETAL: exam stable  Assessment: left foot infection  Plan: Plan for Procedure(s): IRRIGATION AND DEBRIDEMENT LEFT FOOT APPLICATION OF WOUND VAC  The risks benefits and alternatives were discussed with the patient including but not limited to the risks of nonoperative treatment, versus surgical intervention including infection, bleeding, nerve injury,  blood clots, cardiopulmonary complications, morbidity, mortality, among others, and they were willing to proceed.    Preoperative templating of the joint replacement has been completed, documented, and submitted to the Operating Room personnel in order to optimize intra-operative equipment management.   Eduard Roux, MD 08/29/2021 10:38 AM

## 2021-08-29 NOTE — Consult Note (Addendum)
[facility] for Infectious Disease    Date of Admission:  08/29/2021   Total days of antibiotics: ancef/flagyl               Reason for Consult: Osteomyelitis    Referring Provider: Erlinda Hong   Assessment: Osteomyelitis L foot Prev MSSA, Bacteroides, Staph, Pasteurella DM2 (AIC 8.4% 07-02-21) CKD  Plan: Await op cx Diabetic control Watch his Cr  Comment- Appreciate ortho f/u and repeat Bx and Cx.  Unfortunately, his last dose of anbx was this AM.   Thank you so much for this interesting consult,  Principal Problem:   Diabetic infection of left foot ([place])     HPI: Bruce Little is a 50 y.o. male with hx of DM2, and L foot diabetic wound. He was seen in August with infection/osteo of 5th metatarsal/possible septic arthritis 5th MTP and possible L 4th metatarsal. He was started on ancef/flagyl after his Op Cx grew MSSA and bacteroides.  He has had no problem taking this (last dose this AM) but his ESR was >100 at f/u earlier this month. He had a MRI to f/u (08-18-21): 1. Interval postoperative changes from fifth ray resection at the mid fifth metatarsal. Acute osteomyelitis of the residual fifth metatarsal 2. Acute osteomyelitis throughout the fourth metatarsal and base of the fourth toe proximal phalanx. 3. Complex fourth MTP joint effusion most likely reflecting septic arthritis. 4. Mild bone marrow edema within the third metatarsal head and third toe proximal phalanx, which may reflect reactive osteitis. 5. Extensive increased T2 signal throughout the intrinsic foot musculature likely representing a combination of denervation changes and myositis.   He is adm today for biopsy, repeat Cx.    Review of Systems: Review of Systems  Constitutional:  Negative for chills and fever.  Respiratory:  Negative for cough and shortness of breath.   Gastrointestinal:  Negative for constipation and diarrhea.  Genitourinary:  Negative for dysuria.   Past Medical  History:  Diagnosis Date   A-fib ([place])    Chronic kidney disease    DM2 (diabetes mellitus, type 2) ([place])    History of blood transfusion    HTN (hypertension)    Osteomyelitis of fifth toe of left foot ([place]) 08/13/2021   Osteomyelitis of fourth toe of left foot ([place]) 08/13/2021   Stroke ([place]) 05/19/2021   no residual effects.    Social History   Tobacco Use   Smoking status: Never   Smokeless tobacco: Never  Vaping Use   Vaping Use: Never used  Substance Use Topics   Alcohol use: Not Currently    Comment: rare   Drug use: Never    Family History  Problem Relation Age of Onset   Stroke Mother    Cancer Mother    Heart disease Father      Medications: Scheduled:  Abtx:  Anti-infectives (From admission, onward)    Start     Dose/Rate Route Frequency Ordered Stop   08/29/21 1100  ceFAZolin (ANCEF) IVPB 2g/100 mL premix        2 g 200 mL/hr over 30 Minutes Intravenous On call to O.R. 08/29/21 1047 08/30/21 0559   08/29/21 1053  ceFAZolin (ANCEF) 2-4 GM/100ML-% IVPB       Note to Pharmacy: Tamsen Snider   : cabinet override      08/29/21 1053 08/29/21 2259         OBJECTIVE: Blood pressure (!) 156/96, pulse 89, temperature 97.6 F (36.4  C), temperature source Oral, resp. rate 17, height _0  (1.88 m), weight 97.1 kg, SpO2 100 %.  Physical Exam Vitals reviewed.  Constitutional:      Appearance: Normal appearance. He is not ill-appearing or diaphoretic.  HENT:     Mouth/Throat:     Mouth: Mucous membranes are moist.     Pharynx: No oropharyngeal exudate.  Eyes:     Extraocular Movements: Extraocular movements intact.     Pupils: Pupils are equal, round, and reactive to light.  Cardiovascular:     Rate and Rhythm: Normal rate and regular rhythm.  Pulmonary:     Effort: Pulmonary effort is normal.     Breath sounds: Normal breath sounds.  Abdominal:     General: Bowel sounds are normal. There is no distension.     Palpations: Abdomen is soft.      Tenderness: There is no abdominal tenderness.  Musculoskeletal:     Cervical back: Normal range of motion and neck supple.     Right lower leg: No edema.     Left lower leg: No edema.       Feet:  Feet:     Comments: Vac in place on L lateral foot. No pain.  Neurological:     General: No focal deficit present.     Mental Status: He is alert.     Sensory: No sensory deficit.  Psychiatric:        Mood and Affect: Mood normal.    Lab Results Results for orders placed or performed during the hospital encounter of 08/29/21 (from the past 48 hour(s))  Glucose, capillary     Status: Abnormal   Collection Time: 08/29/21 11:03 AM  Result Value Ref Range   Glucose-Capillary 172 (H) 70 - 99 mg/dL    Comment: Glucose reference range applies only to samples taken after fasting for at least 8 hours.  I-STAT, chem 8     Status: Abnormal   Collection Time: 08/29/21 11:22 AM  Result Value Ref Range   Sodium 134 (L) 135 - 145 mmol/L   Potassium 4.5 3.5 - 5.1 mmol/L   Chloride 104 98 - 111 mmol/L   BUN 52 (H) 6 - 20 mg/dL   Creatinine, Ser 3.80 (H) 0.61 - 1.24 mg/dL   Glucose, Bld 176 (H) 70 - 99 mg/dL    Comment: Glucose reference range applies only to samples taken after fasting for at least 8 hours.   Calcium, Ion 1.25 1.15 - 1.40 mmol/L   TCO2 21 (L) 22 - 32 mmol/L   Hemoglobin 10.2 (L) 13.0 - 17.0 g/dL   HCT 30.0 (L) 39.0 - 52.0 %      Component Value Date/Time   SDES TISSUE LEFT FOOT 07/03/2021 1518   SPECREQUEST NONE 07/03/2021 1518   CULT  07/03/2021 1518    FEW PASTEURELLA MULTOCIDA Usually susceptible to penicillin and other beta lactam agents,quinolones,macrolides and tetracyclines. RARE GROUP B STREP(S.AGALACTIAE)ISOLATED RARE STREPTOCOCCUS GROUP G Beta hemolytic streptococci are predictably susceptible to penicillin and other beta lactams. Susceptibility testing not routinely performed. RARE STAPHYLOCOCCUS AUREUS FEW BACTEROIDES VULGATUS CRITICAL RESULT CALLED TO, READ  BACK BY AND VERIFIED WITH: T,BLACK RN _1  07/05/21 EB Performed at [facility] [address], [place], [place] [address]    REPTSTATUS 07/07/2021 FINAL 07/03/2021 1518   No results found. Recent Results (from the past 240 hour(s))  SARS CORONAVIRUS 2 (TAT 6-24 HRS) Nasopharyngeal Nasopharyngeal Swab     Status: None   Collection Time:  08/27/21 11:57 AM   Specimen: Nasopharyngeal Swab  Result Value Ref Range Status   SARS Coronavirus 2 NEGATIVE NEGATIVE Final    Comment: (NOTE) SARS-CoV-2 target nucleic acids are NOT DETECTED.  The SARS-CoV-2 RNA is generally detectable in upper and lower respiratory specimens during the acute phase of infection. Negative results do not preclude SARS-CoV-2 infection, do not rule out co-infections with other pathogens, and should not be used as the sole basis for treatment or other patient management decisions. Negative results must be combined with clinical observations, patient history, and epidemiological information. The expected result is Negative.  Fact Sheet for Patients: SugarRoll.be  Fact Sheet for Healthcare Providers: https://www.woods-mathews.com/  This test is not yet approved or cleared by the [place] FDA and  has been authorized for detection and/or diagnosis of SARS-CoV-2 by FDA under an Emergency Use Authorization (EUA). This EUA will remain  in effect (meaning this test can be used) for the duration of the COVID-19 declaration under Se ction 564(b)(1) of the Act, 21 U.S.C. section 360bbb-3(b)(1), unless the authorization is terminated or revoked sooner.  Performed at [facility], [place] [address], [place], [place] [address]     Microbiology: Recent Results (from the past 240 hour(s))  SARS CORONAVIRUS 2 (TAT 6-24 HRS) Nasopharyngeal Nasopharyngeal Swab     Status: None   Collection Time: 08/27/21 11:57 AM   Specimen: Nasopharyngeal Swab  Result Value Ref Range  Status   SARS Coronavirus 2 NEGATIVE NEGATIVE Final    Comment: (NOTE) SARS-CoV-2 target nucleic acids are NOT DETECTED.  The SARS-CoV-2 RNA is generally detectable in upper and lower respiratory specimens during the acute phase of infection. Negative results do not preclude SARS-CoV-2 infection, do not rule out co-infections with other pathogens, and should not be used as the sole basis for treatment or other patient management decisions. Negative results must be combined with clinical observations, patient history, and epidemiological information. The expected result is Negative.  Fact Sheet for Patients: SugarRoll.be  Fact Sheet for Healthcare Providers: https://www.woods-mathews.com/  This test is not yet approved or cleared by the [place] FDA and  has been authorized for detection and/or diagnosis of SARS-CoV-2 by FDA under an Emergency Use Authorization (EUA). This EUA will remain  in effect (meaning this test can be used) for the duration of the COVID-19 declaration under Se ction 564(b)(1) of the Act, 21 U.S.C. section 360bbb-3(b)(1), unless the authorization is terminated or revoked sooner.  Performed at [facility], [place] [address] [place],  [address]     Radiographs and labs were personally reviewed by me.   Bobby Rumpf, MD [facility] [phone] 08/29/2021, 12:58 PM

## 2021-08-29 NOTE — Progress Notes (Signed)
Dr. Erlinda Hong will be coming in the morning to correct wound-vac.  Was told in report by Pacu RN that patient is to remain on bedrest for tonight and not put pressure until Dr. Erlinda Hong comes in the morning and correct wound-vac.  Patient's wife is inquiring about his iv antibiotics that he normally get through PICC line at home.  Time: 2102 Spoke with Dr. Erlinda Hong.  He placed order for iv antibiotics to start tonight.

## 2021-08-29 NOTE — Anesthesia Procedure Notes (Signed)
Procedure Name: LMA Insertion Date/Time: 08/29/2021 12:39 PM Performed by: Reeves Dam, CRNA Pre-anesthesia Checklist: Patient identified, Patient being monitored, Timeout performed, Emergency Drugs available and Suction available Patient Re-evaluated:Patient Re-evaluated prior to induction Oxygen Delivery Method: Circle system utilized Preoxygenation: Pre-oxygenation with 100% oxygen Induction Type: IV induction Ventilation: Mask ventilation without difficulty LMA: LMA inserted LMA Size: 5.0 Tube type: Oral Number of attempts: 1 Placement Confirmation: positive ETCO2 and breath sounds checked- equal and bilateral Tube secured with: Tape Dental Injury: Teeth and Oropharynx as per pre-operative assessment

## 2021-08-30 DIAGNOSIS — E11628 Type 2 diabetes mellitus with other skin complications: Secondary | ICD-10-CM | POA: Diagnosis not present

## 2021-08-30 DIAGNOSIS — L089 Local infection of the skin and subcutaneous tissue, unspecified: Secondary | ICD-10-CM | POA: Diagnosis not present

## 2021-08-30 LAB — GLUCOSE, CAPILLARY: Glucose-Capillary: 86 mg/dL (ref 70–99)

## 2021-08-30 NOTE — TOC Transition Note (Signed)
Transition of Care [facility]) - CM/SW Discharge Note   Patient Details  Name: Bruce Little MRN: [id number] Date of Birth: 07/09/1971  Transition of [facility]) CM/SW Contact:  Maebelle Munroe, RN Phone Number: 08/30/2021, 11:37 AM   Clinical Narrative:   [facility] team for discharge planning. Spoke to pt and spouseCaryl Pina at bedside. Pt is alert, verbally responsive and pleasant. Discussed the need for DME and the use. They are in agreement of the DME plan. Pt shares he is to come back for surgery on next week. Ordered DME from [employer] who will deliver to the bedside prior to discharge. Will continue to monitor for any further discharge needs.    Final next level of care: Home/Self Care Barriers to Discharge: No Barriers Identified   Patient Goals and CMS Choice Patient states their goals for this hospitalization and ongoing recovery are:: Go home with spouse- Caryl Pina.   Choice offered to / list presented to : Patient  Discharge Placement                       Discharge Plan and Services                DME Arranged: 3-N-1, [place] rolling DME Agency: [employer] Date DME Agency Contacted: 08/30/21 Time DME Agency Contacted: [id number] Representative spoke with at DME Agency: Freda Munro [phone]   [place]: NA        Social Determinants of Health ([place]) Interventions     Readmission Risk Interventions Readmission Risk Prevention Plan 07/11/2021  Transportation Screening Complete  PCP or Specialist Appt within 5-7 Days Complete  Home Care Screening Complete  Medication Review (RN CM) Complete  Some recent data might be hidden

## 2021-08-30 NOTE — Progress Notes (Signed)
PT Cancellation Note  Patient Details Name: Bruce Little MRN: [id number] DOB: [birth year]   Cancelled Treatment:    Reason Eval/Treat Not Completed: PT screened, no needs identified, will sign off (Per conversation with NSG, MD states pt. okay to D/C today, no PT needed.  PT discusses with pt, and pt. states he has no questions or concerns as he has had this same issue/restriction for last 7 weeks.)  Pt. States he will most likely need PT when he returns for further surgery later this week.  Haadi Santellan A. Idabell Picking, PT, DPT Acute Rehabilitation Services Office: [place] 08/30/2021, 9:30 AM

## 2021-08-30 NOTE — Progress Notes (Signed)
   Subjective:  Patient reports pain as mild.  No events. Wound VAC left off overnight due to blockage.  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    Lab Results  Component Value Date   WBC 14.1 (H) 07/11/2021   HGB 10.2 (L) 08/29/2021   HCT 30.0 (L) 08/29/2021   MCV 86.9 07/11/2021   PLT 434 (H) 07/11/2021     Assessment/Plan:  1 Day Post-Op   - Expected postop acute blood loss anemia - will monitor for symptoms - wound VAC was changed at the bed side this morning - NWB LLE, may use knee scooter for ambulation - continue po flagyl and IV rocephin - will monitor wound vac this morning and if functioning well can be switched over to the home [facility] unit and discharge home - 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

## 2021-09-01 ENCOUNTER — Encounter (HOSPITAL_COMMUNITY): Payer: Self-pay | Admitting: Orthopaedic Surgery

## 2021-09-01 NOTE — Discharge Summary (Signed)
Patient ID: COPE MARTE MRN: [id number] DOB/AGE: 1971/02/13 50 y.o.  Admit date: 08/29/2021 Discharge date: 09/01/2021  Admission Diagnoses:  Diabetic infection of left foot [facility])  Discharge Diagnoses:  Principal Problem:   Diabetic infection of left foot ([place])   Past Medical History:  Diagnosis Date   A-fib ([facility])    Chronic kidney disease    DM2 (diabetes mellitus, type 2) ([place])    History of blood transfusion    HTN (hypertension)    Osteomyelitis of fifth toe of left foot ([place]) 08/13/2021   Osteomyelitis of fourth toe of left foot ([place]) 08/13/2021   Stroke ([place]) 05/19/2021   no residual effects.    Surgeries: Procedure(s): IRRIGATION AND DEBRIDEMENT LEFT FOOT APPLICATION OF WOUND VAC AMPUTATION OF FOURTH TOE AND RAY ALONG WITH REMAINING FITH METATARSAL on 08/29/2021   Consultants (if any):   Discharged Condition: Improved  Hospital Course: CLAUDIA ALVIZO is an 50 y.o. male who was admitted 08/29/2021 with a diagnosis of Diabetic infection of left foot ([place]) and went to the operating room on 08/29/2021 and underwent the above named procedures.    He was given perioperative antibiotics:  Anti-infectives (From admission, onward)    Start     Dose/Rate Route Frequency Ordered Stop   08/29/21 2200  metroNIDAZOLE (FLAGYL) tablet 500 mg  Status:  Discontinued        500 mg Oral 2 times daily 08/29/21 1723 08/30/21 1907   08/29/21 2200  cefTRIAXone (ROCEPHIN) 2 g in sodium chloride 0.9 % 100 mL IVPB  Status:  Discontinued        2 g 200 mL/hr over 30 Minutes Intravenous Every 24 hours 08/29/21 2054 08/30/21 1907   08/29/21 1414  vancomycin (VANCOCIN) powder  Status:  Discontinued          As needed 08/29/21 1414 08/29/21 1429   08/29/21 1100  ceFAZolin (ANCEF) IVPB 2g/100 mL premix  Status:  Discontinued        2 g 200 mL/hr over 30 Minutes Intravenous On call to O.R. 08/29/21 1047 08/29/21 1723   08/29/21 1053  ceFAZolin (ANCEF) 2-4 GM/100ML-% IVPB       Note to  Pharmacy: Tamsen Snider   : cabinet override      08/29/21 1053 08/29/21 2259     .  He was given sequential compression devices, early ambulation, and appropriate chemoprophylaxis for DVT prophylaxis.  He benefited maximally from the hospital stay and there were no complications.    Recent vital signs:  Vitals:   08/30/21 0821 08/30/21 1126  BP: (!) 175/97 (!) 141/83  Pulse: 88 90  Resp: 16 17  Temp: 98 F (36.7 C) 98.2 F (36.8 C)  SpO2: 100% 98%    Recent laboratory studies:  Lab Results  Component Value Date   HGB 10.2 (L) 08/29/2021   HGB 9.3 (L) 07/11/2021   HGB 9.1 (L) 07/10/2021   Lab Results  Component Value Date   WBC 14.1 (H) 07/11/2021   PLT 434 (H) 07/11/2021   Lab Results  Component Value Date   INR 1.5 (H) 07/01/2021   Lab Results  Component Value Date   NA 134 (L) 08/29/2021   K 4.5 08/29/2021   CL 104 08/29/2021   CO2 19 (L) 07/11/2021   BUN 52 (H) 08/29/2021   CREATININE 3.80 (H) 08/29/2021   GLUCOSE 176 (H) 08/29/2021    Discharge Medications:   Allergies as of 08/30/2021   No Known Allergies  Medication List     TAKE these medications    acetaminophen 325 MG tablet Commonly known as: TYLENOL Take 2 tablets (650 mg total) by mouth every 4 (four) hours as needed for mild pain (or temp > 37.5 C (99.5 F)).   apixaban 5 MG Tabs tablet Commonly known as: ELIQUIS Take 1 tablet (5 mg total) by mouth 2 (two) times daily.   atorvastatin 80 MG tablet Commonly known as: LIPITOR Take 1 tablet (80 mg total) by mouth every evening.   dorzolamide-timolol 22.3-6.8 MG/ML ophthalmic solution Commonly known as: COSOPT Place 1 drop into the left eye 2 (two) times daily.   glipiZIDE 5 MG tablet Commonly known as: Glucotrol Take 1 tablet (5 mg total) by mouth daily.   hydrOXYzine 25 MG tablet Commonly known as: ATARAX/VISTARIL Take 25 mg by mouth every 8 (eight) hours as needed for anxiety.   loratadine 10 MG tablet Commonly  known as: CLARITIN Take 10 mg by mouth daily.   metoprolol tartrate 100 MG tablet Commonly known as: LOPRESSOR Take 1 tablet (100 mg total) by mouth 2 (two) times daily.   metroNIDAZOLE 500 MG tablet Commonly known as: FLAGYL Take 1 tablet (500 mg total) by mouth 2 (two) times daily for 21 days.   multivitamin Tabs tablet Take 1 tablet by mouth daily.   oxyCODONE 5 MG immediate release tablet Commonly known as: Oxy IR/ROXICODONE Take 1 tablet (5 mg total) by mouth every 6 (six) hours as needed for severe pain.   pantoprazole 40 MG tablet Commonly known as: PROTONIX Take 1 tablet (40 mg total) by mouth daily.   prednisoLONE acetate 1 % ophthalmic suspension Commonly known as: PRED FORTE Place 1 drop into the left eye every 6 (six) hours.   sodium bicarbonate 650 MG tablet Take 1 tablet (650 mg total) by mouth 2 (two) times daily.   traMADol 50 MG tablet Commonly known as: ULTRAM Take 1-2 tablets (50-100 mg total) by mouth daily as needed. What changed:  how much to take reasons to take this   Victoza 18 MG/3ML Sopn Generic drug: liraglutide Inject 1.8 mg into the skin daily.   zolpidem 10 MG tablet Commonly known as: AMBIEN Take 10 mg by mouth at bedtime as needed for sleep.        Diagnostic Studies: MR FOOT LEFT WO CONTRAST  Result Date: 08/20/2021 CLINICAL DATA:  Foot swelling, diabetic, osteomyelitis suspected, xray done EXAM: MRI OF THE LEFT FOOT WITHOUT CONTRAST TECHNIQUE: Multiplanar, multisequence MR imaging of the left forefoot was performed. No intravenous contrast was administered. COMPARISON:  X-ray 08/13/2021, MRI 07/02/2021 FINDINGS: Bones/Joint/Cartilage Interval postoperative changes from fifth ray resection at the mid fifth metatarsal. There is bone marrow edema throughout the residual fifth metatarsal extending to the fifth metatarsal base with intermediate bone marrow signal distally compatible with osteomyelitis (series 7, image 15). Destructive  changes of the base of the fourth toe proximal phalanx with confluent low T1 signal changes. Marked bone marrow edema throughout the entirety of the fourth metatarsal bone with confluent low T1 signal changes extending from the fourth metatarsal head to the mid diaphysis compatible with acute osteomyelitis. Complex fourth MTP joint effusion most likely reflecting septic arthritis. Mild bone marrow edema within the third metatarsal head and third toe proximal phalanx with preserved T1 marrow signal, which may reflect reactive osteitis. First and second rays are intact and demonstrate normal bone marrow signal. Ligaments Intact Lisfranc ligament. Muscles and Tendons Extensive increased T2 signal throughout the intrinsic foot  musculature likely representing a combination of denervation changes and myositis. No tenosynovitis. Soft tissues Surgical wound at the lateral aspect of the forefoot. Extensive soft tissue edema. No organized fluid collection. IMPRESSION: 1. Interval postoperative changes from fifth ray resection at the mid fifth metatarsal. Acute osteomyelitis of the residual fifth metatarsal 2. Acute osteomyelitis throughout the fourth metatarsal and base of the fourth toe proximal phalanx. 3. Complex fourth MTP joint effusion most likely reflecting septic arthritis. 4. Mild bone marrow edema within the third metatarsal head and third toe proximal phalanx, which may reflect reactive osteitis. 5. Extensive increased T2 signal throughout the intrinsic foot musculature likely representing a combination of denervation changes and myositis. These results will be called to the ordering clinician or representative by the Radiologist Assistant, and communication documented in the PACS or [employer]. Electronically Signed   By: Davina Poke D.O.   On: 08/20/2021 16:46   DG Foot Complete Left  Result Date: 08/14/2021 CLINICAL DATA:  Osteomyelitis. EXAM: LEFT FOOT - COMPLETE 3+ VIEW COMPARISON:  MRI left  foot 07/02/2021.  Left foot x-ray 07/01/2021. FINDINGS: There is been interval amputation of the fourth and fifth toes as well as the fourth and fifth distal metatarsals. There is some new cortical irregularity along the lateral margin of the distal third metatarsal. Osteomyelitis cannot be excluded at this level. There is no acute fracture or dislocation. Peripheral vascular calcifications are present. IMPRESSION: 1. 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 2. Cortical irregularity of the lateral aspect of the third distal metatarsal. Osteomyelitis cannot be excluded. Electronically Signed   By: Ronney Asters M.D.   On: 08/14/2021 20:33    Disposition: Discharge disposition: 01-Home or Self Care       Discharge Instructions     Call MD / Call 911   Complete by: As directed    If you experience chest pain or shortness of breath, CALL 911 and be transported to the hospital emergency room.  If you develope a fever above 101.5 F, pus (white drainage) or increased drainage or redness at the wound, or calf pain, call your surgeon's office.   Constipation Prevention   Complete by: As directed    Drink plenty of fluids.  Prune juice may be helpful.  You may use a stool softener, such as Colace (over the counter) 100 mg twice a day.  Use MiraLax (over the counter) for constipation as needed.   Driving restrictions   Complete by: As directed    No driving while taking narcotic pain meds.   Increase activity slowly as tolerated   Complete by: As directed    Post-operative opioid taper instructions:   Complete by: As directed    POST-OPERATIVE OPIOID TAPER INSTRUCTIONS: It is important to wean off of your opioid medication as soon as possible. If you do not need pain medication after your surgery it is ok to stop day one. Opioids include: Codeine, Hydrocodone(Norco, Vicodin), Oxycodone(Percocet, oxycontin) and hydromorphone amongst others.  Long term and  even short term use of opiods can cause: Increased pain response Dependence Constipation Depression Respiratory depression And more.  Withdrawal symptoms can include Flu like symptoms Nausea, vomiting And more Techniques to manage these symptoms Hydrate well Eat regular healthy meals Stay active Use relaxation techniques(deep breathing, meditating, yoga) Do Not substitute Alcohol to help with tapering If you have been on opioids for less than two weeks and do not have pain than it is ok to stop all  together.  Plan to wean off of opioids This plan should start within one week post op of your joint replacement. Maintain the same interval or time between taking each dose and first decrease the dose.  Cut the total daily intake of opioids by one tablet each day Next start to increase the time between doses. The last dose that should be eliminated is the evening dose.             Signed: Eduard Roux 09/01/2021, 1:54 PM

## 2021-09-02 ENCOUNTER — Other Ambulatory Visit: Payer: Self-pay | Admitting: Physician Assistant

## 2021-09-02 ENCOUNTER — Other Ambulatory Visit: Payer: Self-pay

## 2021-09-02 ENCOUNTER — Other Ambulatory Visit: Payer: Self-pay | Admitting: Cardiology

## 2021-09-02 ENCOUNTER — Other Ambulatory Visit: Payer: Self-pay | Admitting: Orthopaedic Surgery

## 2021-09-02 DIAGNOSIS — I480 Paroxysmal atrial fibrillation: Secondary | ICD-10-CM

## 2021-09-02 MED ORDER — TRAMADOL HCL 50 MG PO TABS: 50.0000 mg | ORAL_TABLET | Freq: Every day | ORAL | 2 refills | Status: DC | PRN

## 2021-09-02 NOTE — Progress Notes (Addendum)
Bruce Little denies chest pain or shortness of breath.  Patient denies having any s/s of Covid in his household.  Patient denies any known exposure to Covid.  Bruce Little has type II diabetes, I instructed patient to not take Glipizide or Victoza in am.  I instructed patient to check CBG after awaking and every 2 hours until arrival  to the hospital.  I Instructed patient if CBG is less than 70 to take 4 Glucose Tablets or 1 tube of Glucose Gel or 1/2 cup of a clear juice. Recheck CBG in 15 minutes if CBG is not over 70 call, pre- op desk at [phone] for further instructions. If scheduled to receive Insulin, do not take Insulin

## 2021-09-02 NOTE — Telephone Encounter (Signed)
Sent in

## 2021-09-03 ENCOUNTER — Other Ambulatory Visit: Payer: Self-pay

## 2021-09-03 ENCOUNTER — Inpatient Hospital Stay (HOSPITAL_COMMUNITY)
Admission: RE | Admit: 2021-09-03 | Discharge: 2021-09-03 | DRG: 617 | Disposition: A | Discharge status: Home / Self Care | Payer: BC Managed Care – PPO | Attending: Orthopaedic Surgery | Admitting: Orthopaedic Surgery

## 2021-09-03 ENCOUNTER — Inpatient Hospital Stay (HOSPITAL_COMMUNITY): Payer: BC Managed Care – PPO | Admitting: Anesthesiology

## 2021-09-03 ENCOUNTER — Encounter (HOSPITAL_COMMUNITY): Payer: Self-pay | Admitting: Orthopaedic Surgery

## 2021-09-03 ENCOUNTER — Encounter (HOSPITAL_COMMUNITY)
Admission: RE | Disposition: A | Discharge status: Home / Self Care | Payer: Self-pay | Source: Home / Self Care | Attending: Orthopaedic Surgery

## 2021-09-03 DIAGNOSIS — Z89422 Acquired absence of other left toe(s): Secondary | ICD-10-CM | POA: Diagnosis not present

## 2021-09-03 DIAGNOSIS — Z8249 Family history of ischemic heart disease and other diseases of the circulatory system: Secondary | ICD-10-CM | POA: Diagnosis not present

## 2021-09-03 DIAGNOSIS — M86172 Other acute osteomyelitis, left ankle and foot: Secondary | ICD-10-CM | POA: Diagnosis present

## 2021-09-03 DIAGNOSIS — I4891 Unspecified atrial fibrillation: Secondary | ICD-10-CM | POA: Diagnosis present

## 2021-09-03 DIAGNOSIS — E1169 Type 2 diabetes mellitus with other specified complication: Secondary | ICD-10-CM | POA: Diagnosis present

## 2021-09-03 DIAGNOSIS — Z7984 Long term (current) use of oral hypoglycemic drugs: Secondary | ICD-10-CM

## 2021-09-03 DIAGNOSIS — Z79899 Other long term (current) drug therapy: Secondary | ICD-10-CM

## 2021-09-03 DIAGNOSIS — I129 Hypertensive chronic kidney disease with stage 1 through stage 4 chronic kidney disease, or unspecified chronic kidney disease: Secondary | ICD-10-CM | POA: Diagnosis present

## 2021-09-03 DIAGNOSIS — Z7901 Long term (current) use of anticoagulants: Secondary | ICD-10-CM | POA: Diagnosis not present

## 2021-09-03 DIAGNOSIS — Z20822 Contact with and (suspected) exposure to covid-19: Secondary | ICD-10-CM | POA: Diagnosis present

## 2021-09-03 DIAGNOSIS — E11628 Type 2 diabetes mellitus with other skin complications: Secondary | ICD-10-CM | POA: Diagnosis present

## 2021-09-03 DIAGNOSIS — L089 Local infection of the skin and subcutaneous tissue, unspecified: Secondary | ICD-10-CM | POA: Diagnosis present

## 2021-09-03 DIAGNOSIS — Z8673 Personal history of transient ischemic attack (TIA), and cerebral infarction without residual deficits: Secondary | ICD-10-CM | POA: Diagnosis not present

## 2021-09-03 DIAGNOSIS — Z823 Family history of stroke: Secondary | ICD-10-CM

## 2021-09-03 HISTORY — PX: AMPUTATION: SHX166

## 2021-09-03 LAB — GLUCOSE, CAPILLARY
Glucose-Capillary: 188 mg/dL — ABNORMAL HIGH (ref 70–99)
Glucose-Capillary: 138 mg/dL — ABNORMAL HIGH (ref 70–99)

## 2021-09-03 LAB — SARS CORONAVIRUS 2 BY RT PCR (HOSPITAL ORDER, PERFORMED IN ~~LOC~~ HOSPITAL LAB): SARS Coronavirus 2: NEGATIVE

## 2021-09-03 SURGERY — AMPUTATION, FOOT, PARTIAL
Anesthesia: Regional | Site: Foot | Laterality: Left

## 2021-09-03 MED ORDER — LIDOCAINE 2% (20 MG/ML) 5 ML SYRINGE
INTRAMUSCULAR | Status: DC | PRN
  Administered 2021-09-03: 60 mg via INTRAVENOUS

## 2021-09-03 MED ORDER — ONDANSETRON HCL 4 MG/2ML IJ SOLN
INTRAMUSCULAR | Status: AC
  Filled 2021-09-03: qty 4

## 2021-09-03 MED ORDER — ORAL CARE MOUTH RINSE: 15.0000 mL | Freq: Once | OROMUCOSAL | Status: AC

## 2021-09-03 MED ORDER — MIDAZOLAM HCL 2 MG/2ML IJ SOLN: 4.0000 mg | Freq: Once | INTRAMUSCULAR | Status: AC

## 2021-09-03 MED ORDER — ACETAMINOPHEN 500 MG PO TABS: 1000.0000 mg | ORAL_TABLET | Freq: Once | ORAL | Status: DC

## 2021-09-03 MED ORDER — PHENYLEPHRINE 40 MCG/ML (10ML) SYRINGE FOR IV PUSH (FOR BLOOD PRESSURE SUPPORT)
PREFILLED_SYRINGE | INTRAVENOUS | Status: AC
  Filled 2021-09-03: qty 10

## 2021-09-03 MED ORDER — LACTATED RINGERS IV SOLN: INTRAVENOUS | Status: DC

## 2021-09-03 MED ORDER — SODIUM CHLORIDE 0.9 % IV SOLN: INTRAVENOUS | Status: DC

## 2021-09-03 MED ORDER — VANCOMYCIN HCL 1000 MG IV SOLR
INTRAVENOUS | Status: DC | PRN
  Administered 2021-09-03: 1000 mg via TOPICAL

## 2021-09-03 MED ORDER — TRAMADOL HCL 50 MG PO TABS: 50.0000 mg | ORAL_TABLET | Freq: Every day | ORAL | 0 refills | Status: DC | PRN

## 2021-09-03 MED ORDER — FENTANYL CITRATE (PF) 100 MCG/2ML IJ SOLN
INTRAMUSCULAR | Status: DC | PRN
  Administered 2021-09-03: 50 ug via INTRAVENOUS

## 2021-09-03 MED ORDER — FENTANYL CITRATE (PF) 100 MCG/2ML IJ SOLN: 25.0000 ug | INTRAMUSCULAR | Status: DC | PRN

## 2021-09-03 MED ORDER — MIDAZOLAM HCL 2 MG/2ML IJ SOLN
INTRAMUSCULAR | Status: AC
  Administered 2021-09-03: 4 mg via INTRAVENOUS
  Filled 2021-09-03: qty 2

## 2021-09-03 MED ORDER — FENTANYL CITRATE (PF) 100 MCG/2ML IJ SOLN: 100.0000 ug | Freq: Once | INTRAMUSCULAR | Status: AC

## 2021-09-03 MED ORDER — SODIUM CHLORIDE 0.9 % IR SOLN
Status: DC | PRN
  Administered 2021-09-03: 3000 mL

## 2021-09-03 MED ORDER — HEPARIN SOD (PORK) LOCK FLUSH 100 UNIT/ML IV SOLN
250.0000 [IU] | INTRAVENOUS | Status: AC | PRN
  Administered 2021-09-03 (×2): 250 [IU]
  Filled 2021-09-03: qty 2.5

## 2021-09-03 MED ORDER — FENTANYL CITRATE (PF) 250 MCG/5ML IJ SOLN
INTRAMUSCULAR | Status: AC
  Filled 2021-09-03: qty 5

## 2021-09-03 MED ORDER — SUGAMMADEX SODIUM 500 MG/5ML IV SOLN
INTRAVENOUS | Status: AC
  Filled 2021-09-03: qty 5

## 2021-09-03 MED ORDER — PROPOFOL 10 MG/ML IV BOLUS
INTRAVENOUS | Status: DC | PRN
  Administered 2021-09-03: 150 mg via INTRAVENOUS

## 2021-09-03 MED ORDER — CEFAZOLIN SODIUM-DEXTROSE 2-4 GM/100ML-% IV SOLN
2.0000 g | INTRAVENOUS | Status: AC
  Administered 2021-09-03: 2 g via INTRAVENOUS

## 2021-09-03 MED ORDER — EPHEDRINE 5 MG/ML INJ
INTRAVENOUS | Status: AC
  Filled 2021-09-03: qty 5

## 2021-09-03 MED ORDER — DEXAMETHASONE SODIUM PHOSPHATE 10 MG/ML IJ SOLN
INTRAMUSCULAR | Status: AC
  Filled 2021-09-03: qty 1

## 2021-09-03 MED ORDER — VANCOMYCIN HCL 1000 MG IV SOLR
INTRAVENOUS | Status: AC
  Filled 2021-09-03: qty 20

## 2021-09-03 MED ORDER — CHLORHEXIDINE GLUCONATE 0.12 % MT SOLN
OROMUCOSAL | Status: AC
  Administered 2021-09-03: 15 mL via OROMUCOSAL
  Filled 2021-09-03: qty 15

## 2021-09-03 MED ORDER — CHLORHEXIDINE GLUCONATE 0.12 % MT SOLN: 15.0000 mL | Freq: Once | OROMUCOSAL | Status: AC

## 2021-09-03 MED ORDER — 0.9 % SODIUM CHLORIDE (POUR BTL) OPTIME
TOPICAL | Status: DC | PRN
  Administered 2021-09-03: 1000 mL

## 2021-09-03 MED ORDER — LIDOCAINE 2% (20 MG/ML) 5 ML SYRINGE
INTRAMUSCULAR | Status: AC
  Filled 2021-09-03: qty 10

## 2021-09-03 MED ORDER — DEXAMETHASONE SODIUM PHOSPHATE 10 MG/ML IJ SOLN
INTRAMUSCULAR | Status: DC | PRN
  Administered 2021-09-03 (×2): 5 mg

## 2021-09-03 MED ORDER — LACTATED RINGERS IV SOLN: INTRAVENOUS | Status: DC | PRN

## 2021-09-03 MED ORDER — PROPOFOL 10 MG/ML IV BOLUS
INTRAVENOUS | Status: AC
  Filled 2021-09-03: qty 20

## 2021-09-03 MED ORDER — ROPIVACAINE HCL 5 MG/ML IJ SOLN
INTRAMUSCULAR | Status: DC | PRN
  Administered 2021-09-03: 20 mL via PERINEURAL
  Administered 2021-09-03: 30 mL via PERINEURAL

## 2021-09-03 MED ORDER — CEFAZOLIN SODIUM-DEXTROSE 2-4 GM/100ML-% IV SOLN
INTRAVENOUS | Status: AC
  Filled 2021-09-03: qty 100

## 2021-09-03 MED ORDER — FENTANYL CITRATE (PF) 100 MCG/2ML IJ SOLN
INTRAMUSCULAR | Status: AC
  Administered 2021-09-03: 100 ug via INTRAVENOUS
  Filled 2021-09-03: qty 2

## 2021-09-03 MED ORDER — ONDANSETRON HCL 4 MG/2ML IJ SOLN
INTRAMUSCULAR | Status: DC | PRN
  Administered 2021-09-03: 4 mg via INTRAVENOUS

## 2021-09-03 SURGICAL SUPPLY — 39 items
BAG COUNTER SPONGE SURGICOUNT (BAG) ×2 IMPLANT
BANDAGE ESMARK 6X9 LF (GAUZE/BANDAGES/DRESSINGS) ×1 IMPLANT
BLADE AVERAGE 25X9 (BLADE) IMPLANT
BNDG COHESIVE 4X5 TAN STRL (GAUZE/BANDAGES/DRESSINGS) ×2 IMPLANT
BNDG ESMARK 6X9 LF (GAUZE/BANDAGES/DRESSINGS) ×2
BNDG GAUZE ELAST 4 BULKY (GAUZE/BANDAGES/DRESSINGS) ×2 IMPLANT
CLEANER TIP ELECTROSURG 2X2 (MISCELLANEOUS) ×2 IMPLANT
COVER SURGICAL LIGHT HANDLE (MISCELLANEOUS) ×2 IMPLANT
CUFF TOURN SGL QUICK 34 (TOURNIQUET CUFF) ×1
CUFF TRNQT CYL 34X4.125X (TOURNIQUET CUFF) ×1 IMPLANT
DRAPE U-SHAPE 47X51 STRL (DRAPES) ×2 IMPLANT
DRESSING PEEL AND PLAC PRVNA20 (GAUZE/BANDAGES/DRESSINGS) IMPLANT
DRSG PAD ABDOMINAL 8X10 ST (GAUZE/BANDAGES/DRESSINGS) ×2 IMPLANT
DRSG PEEL AND PLACE PREVENA 20 (GAUZE/BANDAGES/DRESSINGS) ×2
ELECT CAUTERY BLADE 6.4 (BLADE) ×2 IMPLANT
ELECT REM PT RETURN 9FT ADLT (ELECTROSURGICAL) ×2
ELECTRODE REM PT RTRN 9FT ADLT (ELECTROSURGICAL) ×1 IMPLANT
FACESHIELD WRAPAROUND (MASK) IMPLANT
FACESHIELD WRAPAROUND OR TEAM (MASK) IMPLANT
GAUZE SPONGE 4X4 12PLY STRL (GAUZE/BANDAGES/DRESSINGS) ×2 IMPLANT
GAUZE XEROFORM 5X9 LF (GAUZE/BANDAGES/DRESSINGS) ×2 IMPLANT
GLOVE SURG LTX SZ7 (GLOVE) ×2 IMPLANT
GLOVE SURG UNDER POLY LF SZ7 (GLOVE) ×40 IMPLANT
GLOVE SURG UNDER POLY LF SZ7.5 (GLOVE) ×8 IMPLANT
GOWN STRL REIN XL XLG (GOWN DISPOSABLE) ×2 IMPLANT
KIT BASIN OR (CUSTOM PROCEDURE TRAY) ×2 IMPLANT
NS IRRIG 1000ML POUR BTL (IV SOLUTION) ×2 IMPLANT
PACK ORTHO EXTREMITY (CUSTOM PROCEDURE TRAY) ×4 IMPLANT
PAD ARMBOARD 7.5X6 YLW CONV (MISCELLANEOUS) ×2 IMPLANT
PAD CAST 4YDX4 CTTN HI CHSV (CAST SUPPLIES) ×4 IMPLANT
PADDING CAST COTTON 4X4 STRL (CAST SUPPLIES) ×4
SPONGE T-LAP 18X18 ~~LOC~~+RFID (SPONGE) ×1 IMPLANT
STAPLER VISISTAT 35W (STAPLE) IMPLANT
SUT ETHILON 2 0 PSLX (SUTURE) ×6 IMPLANT
SUT PDS AB 1 CT  36 (SUTURE)
SUT PDS AB 1 CT 36 (SUTURE) ×1 IMPLANT
SUT VIC AB 2-0 CT1 27 (SUTURE)
SUT VIC AB 2-0 CT1 TAPERPNT 27 (SUTURE) ×1 IMPLANT
TOWEL GREEN STERILE (TOWEL DISPOSABLE) ×6 IMPLANT

## 2021-09-03 NOTE — Anesthesia Preprocedure Evaluation (Addendum)
Anesthesia Evaluation  Patient identified by MRN, date of birth, ID band Patient awake    Reviewed: Allergy & Precautions, NPO status , Patient's Chart, lab work & pertinent test results, reviewed documented beta blocker date and time   Airway Mallampati: II  TM Distance: >3 FB Neck ROM: Full    Dental no notable dental hx. (+) Teeth Intact, Dental Advisory Given   Pulmonary neg pulmonary ROS,    Pulmonary exam normal breath sounds clear to auscultation       Cardiovascular hypertension, Pt. on medications and Pt. on home beta blockers Normal cardiovascular exam+ dysrhythmias Atrial Fibrillation  Rhythm:Regular Rate:Normal  TTE 2022 1. Left ventricular ejection fraction, by estimation, is 55 to 60%. The  left ventricle has normal function. The left ventricle has no regional  wall motion abnormalities. There is mild left ventricular hypertrophy.  Left ventricular diastolic parameters  were normal.  2. Right ventricular systolic function is normal. The right ventricular  size is normal. Tricuspid regurgitation signal is inadequate for assessing  PA pressure.  3. The mitral valve is normal in structure. Mild mitral valve  regurgitation. No evidence of mitral stenosis.  4. The aortic valve is grossly normal. Aortic valve regurgitation is  trivial. No aortic stenosis is present.  5. The inferior vena cava is normal in size with greater than 50%  respiratory variability, suggesting right atrial pressure of 3 mmHg.    Neuro/Psych CVA, No Residual Symptoms negative psych ROS   GI/Hepatic negative GI ROS, Neg liver ROS,   Endo/Other  diabetes, Type 2, Oral Hypoglycemic Agents  Renal/GU Renal InsufficiencyRenal diseaseLab Results      Component                Value               Date                      CREATININE               3.80 (H)            08/29/2021                BUN                      52 (H)               08/29/2021                NA                       134 (L)             08/29/2021                K                        4.5                 08/29/2021                CL                       104                 08/29/2021                CO2  19 (L)              07/11/2021             negative genitourinary   Musculoskeletal negative musculoskeletal ROS (+)   Abdominal   Peds  Hematology  (+) Blood dyscrasia (on eliquis and plavix), ,   Anesthesia Other Findings   Reproductive/Obstetrics                            Anesthesia Physical Anesthesia Plan  ASA: 3  Anesthesia Plan: General and Regional   Post-op Pain Management:  Regional for Post-op pain   Induction: Intravenous  PONV Risk Score and Plan: 2 and Ondansetron, Dexamethasone and Midazolam  Airway Management Planned: LMA  Additional Equipment:   Intra-op Plan:   Post-operative Plan: Extubation in OR  Informed Consent: I have reviewed the patients History and Physical, chart, labs and discussed the procedure including the risks, benefits and alternatives for the proposed anesthesia with the patient or authorized representative who has indicated his/her understanding and acceptance.     Dental advisory given  Plan Discussed with: CRNA  Anesthesia Plan Comments:         Anesthesia Quick Evaluation

## 2021-09-03 NOTE — H&P (Signed)

## 2021-09-03 NOTE — Anesthesia Procedure Notes (Signed)
Procedure Name: LMA Insertion Date/Time: 09/03/2021 3:31 PM Performed by: Georgia Duff, CRNA Pre-anesthesia Checklist: Patient identified, Emergency Drugs available, Suction available and Patient being monitored Patient Re-evaluated:Patient Re-evaluated prior to induction Oxygen Delivery Method: Circle System Utilized Preoxygenation: Pre-oxygenation with 100% oxygen Induction Type: IV induction Ventilation: Mask ventilation without difficulty LMA: LMA inserted LMA Size: 4.0 Number of attempts: 1 Airway Equipment and Method: Bite block Placement Confirmation: positive ETCO2 Tube secured with: Tape Dental Injury: Teeth and Oropharynx as per pre-operative assessment

## 2021-09-03 NOTE — Op Note (Signed)
   Date of Surgery: 09/03/2021  INDICATIONS: Bruce Little is a 50 y.o.-year-old male with a left diabetic foot infection who returns today for definitive treatment.  The Patient did consent to the procedure after discussion of the risks and benefits.  PREOPERATIVE DIAGNOSIS: Left diabetic foot infection with open wound status post serial debridements  POSTOPERATIVE DIAGNOSIS: Same.  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 Eduard Roux, M.D.  ASSIST: Bruce Little, Bruce Little; necessary for the timely completion of procedure and due to complexity of procedure.  ANESTHESIA:  general, regional block  IV FLUIDS AND URINE: See anesthesia.  ESTIMATED BLOOD LOSS: 50 mL.  IMPLANTS: none  DRAINS: incisional VAC  COMPLICATIONS: see description of procedure.  DESCRIPTION OF PROCEDURE: The patient was brought to the operating room.  The patient had been signed prior to the procedure and this was documented. The patient had the anesthesia placed by the anesthesiologist.  A time-out was performed to confirm that this was the correct patient, site, side and location. The patient did receive antibiotics prior to the incision and was re-dosed during the procedure as needed at indicated intervals.  A tourniquet was placed on the upper thigh.  The wound vac was removed.  The patient had the operative extremity prepped and draped in the standard surgical fashion.    A modified fishmouth incision was made with the remaining skin.  Full thickness flaps were elevated off of the remaining metatarsals with a knife and bovie.  The skin edges were  meticulously handled.  Subperiosteal elevation was performed back to the TMT joint at which point this was sharply disarticulated.  I then performed excisional debridement of the previous wound with a knife and rongeur.  Devitalized skin, muscle, subcutaneous tissue, were all debrided mainly in the area of the previous wound.  The chondral surface of the cuboid looked good.  The flexor and extensor tendons to the toes were all sharply cut and allowed to retract proximally.  Once I was happy with the debridement, I deflated the tourniquet and the tissues all bled well.  Hemostasis was obtained.  3 liters of normal saline was irrigated with pulse lavage.  Given the odd shape of the remaining tissue and defect left by the previous wound, adjacent tissue had to be performed in order to achieve primary closure.  Back cuts were made to accomplish this.  The skin flaps were fashioned and dog ears were removed.  2-0 nylon was used for closure of the wound.  Incisional VAC was placed on top the incision for help promote healing.  He did not have an achilles contracture therefore I did not feel that a gastrocnemius recession was necessary.  Short leg splint was placed with the ankle in 5 degrees of dorsiflexion.    Bruce Little was necessary for opening, closing, retracting, limb positioning and overall facilitation and timely completion of the procedure.  POSTOPERATIVE PLAN: Discharge home.  Nonweight bearing left foot.  May use knee scooter for short distance ambulation.  Keep splint and VAC on until follow up appointment in 2 weeks.  Azucena Cecil, MD 5:18 PM

## 2021-09-03 NOTE — Transfer of Care (Signed)
Immediate Anesthesia Transfer of Care Note  Patient: Bruce Little  Procedure(s) Performed: LISFRANC AMPUTATION (Left: Foot)  Patient Location: PACU  Anesthesia Type:General  Level of Consciousness: awake and patient cooperative  Airway & Oxygen Therapy: Patient Spontanous Breathing  Post-op Assessment: Report given to RN and Post -op Vital signs reviewed and stable  Post vital signs: Reviewed and stable  Last Vitals:  Vitals Value Taken Time  BP 145/89 09/03/21 1705  Temp    Pulse 87 09/03/21 1709  Resp 11 09/03/21 1709  SpO2 97 % 09/03/21 1709  Vitals shown include unvalidated device data.  Last Pain:  Vitals:   09/03/21 1236  TempSrc:   PainSc: 4       Patients Stated Pain Goal: 0 (47/12/52 7129)  Complications: No notable events documented.

## 2021-09-03 NOTE — Progress Notes (Signed)
Orthopedic Tech Progress Note Patient Details:  DORRIEN GRUNDER August 30, 1971 [id number]  PACU RN called requesting a pair of CRUTCHES and a POST OP SHOE   Ortho Devices Type of Ortho Device: Crutches, Postop shoe/boot Ortho Device/Splint Location: LLE Ortho Device/Splint Interventions: Ordered, Application, Adjustment   Post Interventions Patient Tolerated: Well Instructions Provided: Care of [place] 09/03/2021, 6:39 PM

## 2021-09-03 NOTE — Anesthesia Procedure Notes (Signed)
Anesthesia Regional Block: Popliteal block   Pre-Anesthetic Checklist: , timeout performed,  Correct Patient, Correct Site, Correct Laterality,  Correct Procedure, Correct Position, site marked,  Risks and benefits discussed,  Surgical consent,  Pre-op evaluation,  At surgeon's request and post-op pain management  Laterality: Left  Prep: Maximum Sterile Barrier Precautions used, chloraprep       Needles:  Injection technique: Single-shot  Needle Type: Echogenic Stimulator Needle     Needle Length: 9cm  Needle Gauge: 22     Additional Needles:   Procedures:,,,, ultrasound used (permanent image in chart),,    Narrative:  Start time: 09/03/2021 2:19 PM End time: 09/03/2021 2:22 PM Injection made incrementally with aspirations every 5 mL.  Performed by: Personally  Anesthesiologist: Freddrick March, MD  Additional Notes: Monitors applied. No increased pain on injection. No increased resistance to injection. Injection made in 5cc increments. Good needle visualization. Patient tolerated procedure well.

## 2021-09-03 NOTE — Discharge Instructions (Signed)
    1. Keep splint clean and dry 2. Elevate foot above level of the heart 3. Take eliquis to prevent blood clots 4. Take pain meds as needed 5. Strict non weight bearing to operative extremity

## 2021-09-03 NOTE — Anesthesia Procedure Notes (Signed)
Anesthesia Regional Block: Adductor canal block   Pre-Anesthetic Checklist: , timeout performed,  Correct Patient, Correct Site, Correct Laterality,  Correct Procedure, Correct Position, site marked,  Risks and benefits discussed,  Surgical consent,  Pre-op evaluation,  At surgeon's request and post-op pain management  Laterality: Left  Prep: Maximum Sterile Barrier Precautions used, chloraprep       Needles:  Injection technique: Single-shot  Needle Type: Echogenic Stimulator Needle     Needle Length: 9cm  Needle Gauge: 22     Additional Needles:   Procedures:,,,, ultrasound used (permanent image in chart),,    Narrative:  Start time: 09/03/2021 2:22 PM End time: 09/03/2021 2:25 PM Injection made incrementally with aspirations every 5 mL.  Performed by: Personally  Anesthesiologist: Freddrick March, MD  Additional Notes: Monitors applied. No increased pain on injection. No increased resistance to injection. Injection made in 5cc increments. Good needle visualization. Patient tolerated procedure well.

## 2021-09-04 ENCOUNTER — Telehealth: Payer: Self-pay

## 2021-09-04 ENCOUNTER — Encounter (HOSPITAL_COMMUNITY): Payer: Self-pay | Admitting: Orthopaedic Surgery

## 2021-09-04 NOTE — Telephone Encounter (Signed)
Patient concerning wound Vac not working.  Stated that it's saying there is a blockage.  Would like to know what he needs to do?  Cb# [phone].  Please advise.  Thank you.

## 2021-09-04 NOTE — H&P (Signed)
PREOPERATIVE H&P  Chief Complaint: diabetic infection left foot  HPI: Bruce Little is a 50 y.o. male who presents for surgical treatment of diabetic infection left foot.  He denies any changes in medical history.  Past Medical History:  Diagnosis Date   A-fib ([place])    Chronic kidney disease    DM2 (diabetes mellitus, type 2) ([place])    History of blood transfusion    HTN (hypertension)    Osteomyelitis of fifth toe of left foot ([place]) 08/13/2021   Osteomyelitis of fourth toe of left foot ([place]) 08/13/2021   Stroke ([place]) 05/19/2021   no residual effects.   Past Surgical History:  Procedure Laterality Date   AMPUTATION Left 08/29/2021   Procedure: AMPUTATION OF FOURTH TOE AND RAY ALONG WITH REMAINING FITH METATARSAL;  Surgeon: Leandrew Koyanagi, MD;  Location: [place];  Service: Orthopedics;  Laterality: Left;   AMPUTATION Left 09/03/2021   Procedure: LISFRANC AMPUTATION;  Surgeon: Leandrew Koyanagi, MD;  Location: [place];  Service: Orthopedics;  Laterality: Left;   APPLICATION OF WOUND VAC Left 07/07/2021   Procedure: APPLICATION OF WOUND VAC;  Surgeon: Leandrew Koyanagi, MD;  Location: [place];  Service: Orthopedics;  Laterality: Left;   APPLICATION OF WOUND VAC Left 08/29/2021   Procedure: APPLICATION OF WOUND VAC;  Surgeon: Leandrew Koyanagi, MD;  Location: [place];  Service: Orthopedics;  Laterality: Left;   I & D EXTREMITY Left 07/03/2021   Procedure: IRRIGATION AND DEBRIDEMENT ,FIFTH RAY  AMPUTATION LEFT FOOT, , WOUND VAC PLACEMENT;  Surgeon: Leandrew Koyanagi, MD;  Location: [place];  Service: Orthopedics;  Laterality: Left;   I & D EXTREMITY Left 07/07/2021   Procedure: IRRIGATION AND DEBRIDEMENT LEFT FOOT;  Surgeon: Leandrew Koyanagi, MD;  Location: [place];  Service: Orthopedics;  Laterality: Left;   I & D EXTREMITY Left 08/29/2021   Procedure: IRRIGATION AND DEBRIDEMENT LEFT FOOT;  Surgeon: Leandrew Koyanagi, MD;  Location: [place];  Service: Orthopedics;  Laterality: Left;   IR FLUORO GUIDE CV LINE RIGHT   07/09/2021   IR US GUIDE VASC ACCESS RIGHT  07/09/2021   VITRECTOMY Left    [place] eye   Social History   Socioeconomic History   Marital status: Married    Spouse name: Caryl Pina   Number of children: Not on file   Years of education: Not on file   Highest education level: Not on file  Occupational History   Not on file  Tobacco Use   Smoking status: Never   Smokeless tobacco: Never  Vaping Use   Vaping Use: Never used  Substance and Sexual Activity   Alcohol use: Not Currently    Comment: rare   Drug use: Never   Sexual activity: Not on file  Other Topics Concern   Not on file  Social History Narrative   Not on file   Social Determinants of Health   Financial Resource Strain: Not on file  Food Insecurity: Not on file  Transportation Needs: Not on file  Physical Activity: Not on file  Stress: Not on file  Social Connections: Not on file   Family History  Problem Relation Age of Onset   Stroke Mother    Cancer Mother    Heart disease Father    No Known Allergies Prior to Admission medications   Medication Sig Start Date End Date Taking? Authorizing Provider  acetaminophen (TYLENOL) 325 MG tablet Take 2 tablets (650 mg total) by mouth every 4 (  four) hours as needed for mild pain (or temp > 37.5 C (99.5 F)). 05/21/21  Yes Sheikh, Omair Latif, DO  amLODipine (NORVASC) 10 MG tablet Take 10 mg by mouth daily.   Yes [provider]  atorvastatin (LIPITOR) 80 MG tablet Take 1 tablet (80 mg total) by mouth every evening. 07/31/21  Yes Patwardhan, Manish J, MD  ceFAZolin (ANCEF) IVPB Inject 2 g into the vein every 12 (twelve) hours.   Yes [provider]  dorzolamide-timolol (COSOPT) 22.3-6.8 MG/ML ophthalmic solution Place 1 drop into the left eye 2 (two) times daily. 08/24/21  Yes [provider]  glipiZIDE (GLUCOTROL) 5 MG tablet Take 1 tablet (5 mg total) by mouth daily. 05/21/21 05/21/22 Yes Sheikh, Omair Latif, DO  hydrOXYzine (ATARAX/VISTARIL)  25 MG tablet Take 25 mg by mouth every 8 (eight) hours as needed for anxiety. 03/30/21  Yes [provider]  loratadine (CLARITIN) 10 MG tablet Take 10 mg by mouth daily.   Yes [provider]  metoprolol tartrate (LOPRESSOR) 100 MG tablet Take 1 tablet (100 mg total) by mouth 2 (two) times daily. 07/31/21  Yes Patwardhan, Manish J, MD  multivitamin (ONE-A-DAY MEN'S) TABS tablet Take 1 tablet by mouth daily.   Yes [provider]  mupirocin ointment (BACTROBAN) 2 % Apply 1 application topically 2 (two) times daily. 08/27/21  Yes [provider]  oxyCODONE (OXY IR/ROXICODONE) 5 MG immediate release tablet Take 1 tablet (5 mg total) by mouth every 6 (six) hours as needed for severe pain. 07/11/21  Yes Thurnell Lose, MD  pantoprazole (PROTONIX) 40 MG tablet Take 1 tablet (40 mg total) by mouth daily. 05/22/21  Yes Sheikh, Omair Latif, DO  prednisoLONE acetate (PRED FORTE) 1 % ophthalmic suspension Place 1 drop into the left eye every 6 (six) hours. 08/15/21  Yes [provider]  traMADol (ULTRAM) 50 MG tablet Take 1 tablet (50 mg total) by mouth daily as needed for moderate pain. 09/02/21  Yes Aundra Dubin, PA-C  traMADol (ULTRAM) 50 MG tablet Take 1-2 tablets (50-100 mg total) by mouth daily as needed. 09/03/21  Yes Leandrew Koyanagi, MD  VICTOZA 18 MG/3ML SOPN Inject 1.8 mg into the skin daily. 05/07/21  Yes [provider]  ELIQUIS 5 MG TABS tablet TAKE 1 TABLET BY MOUTH TWICE A DAY 09/02/21   Patwardhan, Manish J, MD  sodium bicarbonate 650 MG tablet Take 1 tablet (650 mg total) by mouth 2 (two) times daily. Patient not taking: No sig reported 07/11/21   Thurnell Lose, MD  zolpidem (AMBIEN) 10 MG tablet Take 10 mg by mouth at bedtime as needed for sleep.    [provider]     Positive ROS: All other systems have been reviewed and were otherwise negative with the exception of those mentioned in the HPI and as above.  Physical  Exam: General: Alert, no acute distress Cardiovascular: No pedal edema Respiratory: No cyanosis, no use of accessory musculature GI: abdomen soft Skin: No lesions in the area of chief complaint Neurologic: Sensation intact distally Psychiatric: Patient is competent for consent with normal mood and affect Lymphatic: no lymphedema  MUSCULOSKELETAL: exam stable  Assessment: diabetic infection left foot  Plan: Plan for Procedure(s): LISFRANC AMPUTATION  The risks benefits and alternatives were discussed with the patient including but not limited to the risks of nonoperative treatment, versus surgical intervention including infection, bleeding, nerve injury,  blood clots, cardiopulmonary complications, morbidity, mortality, among others, and they were willing to  proceed.   Preoperative templating of the joint replacement has been completed, documented, and submitted to the Operating Room personnel in order to optimize intra-operative equipment management.   Eduard Roux, MD 09/04/2021 8:58 PM

## 2021-09-04 NOTE — Anesthesia Postprocedure Evaluation (Signed)
Anesthesia Post Note  Patient: Bruce Little  Procedure(s) Performed: LISFRANC AMPUTATION (Left: Foot)     Patient location during evaluation: PACU Anesthesia Type: Regional and General Level of consciousness: awake and alert Pain management: pain level controlled Vital Signs Assessment: post-procedure vital signs reviewed and stable Respiratory status: spontaneous breathing, nonlabored ventilation, respiratory function stable and patient connected to nasal cannula oxygen Cardiovascular status: blood pressure returned to baseline and stable Postop Assessment: no apparent nausea or vomiting Anesthetic complications: no   No notable events documented.  Last Vitals:  Vitals:   09/03/21 1835 09/03/21 1900  BP: (!) 162/97 (!) 166/92  Pulse: 85 91  Resp: 15 19  Temp:  (!) 36.1 C  SpO2: 99% 98%    Last Pain:  Vitals:   09/03/21 1900  TempSrc:   PainSc: 0-No pain                 Kenedee Molesky L Destani Wamser

## 2021-09-04 NOTE — Telephone Encounter (Signed)
States its working now. Per Dr. Erlinda Hong leave wound VAC on for 2 weeks until next appt.  He will call us sooner if he is having any issues.

## 2021-09-04 NOTE — Telephone Encounter (Signed)
Patient called stating that his HHN would like someone to call.  Stated that  it was concerning his visit for tomorrow.  CB# [phone].  Please advise.  Thank you.

## 2021-09-04 NOTE — Telephone Encounter (Signed)
Have him come in this afternoon to see Autumn to see if she can troubleshoot it or we can see him in the morning.

## 2021-09-05 ENCOUNTER — Encounter: Payer: BC Managed Care – PPO | Admitting: Orthopaedic Surgery

## 2021-09-05 NOTE — Telephone Encounter (Signed)
Just spoke to him and everythings good.

## 2021-09-05 NOTE — Telephone Encounter (Signed)
Patient would like a CB from you to discuss PIC line  HHN  etc.

## 2021-09-05 NOTE — Anesthesia Postprocedure Evaluation (Signed)
Anesthesia Post Note  Patient: Bruce Little  Procedure(s) Performed: IRRIGATION AND DEBRIDEMENT LEFT FOOT (Left: Foot) APPLICATION OF WOUND VAC (Left) AMPUTATION OF FOURTH TOE AND RAY ALONG WITH REMAINING FITH METATARSAL (Left)     Patient location during evaluation: PACU Anesthesia Type: General Level of consciousness: awake and sedated Pain management: pain level controlled Vital Signs Assessment: post-procedure vital signs reviewed and stable Respiratory status: spontaneous breathing Cardiovascular status: stable Postop Assessment: no apparent nausea or vomiting Anesthetic complications: no   No notable events documented.  Last Vitals:  Vitals:   08/30/21 0821 08/30/21 1126  BP: (!) 175/97 (!) 141/83  Pulse: 88 90  Resp: 16 17  Temp: 36.7 C 36.8 C  SpO2: 100% 98%    Last Pain:  Vitals:   08/30/21 1126  TempSrc: Oral  PainSc:                  Huston Foley

## 2021-09-09 LAB — POCT I-STAT, CHEM 8
BUN: 48 mg/dL — ABNORMAL HIGH (ref 6–20)
Calcium, Ion: 1.2 mmol/L (ref 1.15–1.40)
Chloride: 103 mmol/L (ref 98–111)
Creatinine, Ser: 3.4 mg/dL — ABNORMAL HIGH (ref 0.61–1.24)
Glucose, Bld: 200 mg/dL — ABNORMAL HIGH (ref 70–99)
HCT: 24 % — ABNORMAL LOW (ref 39.0–52.0)
Hemoglobin: 8.2 g/dL — ABNORMAL LOW (ref 13.0–17.0)
Potassium: 4.4 mmol/L (ref 3.5–5.1)
Sodium: 134 mmol/L — ABNORMAL LOW (ref 135–145)
TCO2: 20 mmol/L — ABNORMAL LOW (ref 22–32)

## 2021-09-10 NOTE — Discharge Summary (Signed)
Patient ID: Bruce Little MRN: [id number] DOB/AGE: February 06, 1971 50 y.o.  Admit date: 09/03/2021 Discharge date: 09/10/2021  Admission Diagnoses:  Diabetic infection of left foot [facility])  Discharge Diagnoses:  Principal Problem:   Diabetic infection of left foot ([place]) Active Problems:   Acute osteomyelitis of left foot ([place])   Past Medical History:  Diagnosis Date   A-fib ([place])    Chronic kidney disease    DM2 (diabetes mellitus, type 2) ([place])    History of blood transfusion    HTN (hypertension)    Osteomyelitis of fifth toe of left foot ([place]) 08/13/2021   Osteomyelitis of fourth toe of left foot ([place]) 08/13/2021   Stroke ([place]) 05/19/2021   no residual effects.    Surgeries: Procedure(s): LISFRANC AMPUTATION on 09/03/2021   Consultants (if any):   Discharged Condition: Improved  Hospital Course: Bruce Little is an 50 y.o. male who was admitted 09/03/2021 with a diagnosis of Diabetic infection of left foot ([place]) and went to the operating room on 09/03/2021 and underwent the above named procedures.    He was given perioperative antibiotics:  Anti-infectives (From admission, onward)    Start     Dose/Rate Route Frequency Ordered Stop   09/04/21 0600  ceFAZolin (ANCEF) IVPB 2g/100 mL premix        2 g 200 mL/hr over 30 Minutes Intravenous On call to O.R. 09/03/21 1217 09/03/21 1534   09/03/21 1600  vancomycin (VANCOCIN) powder  Status:  Discontinued          As needed 09/03/21 1601 09/03/21 1700   09/03/21 1300  ceFAZolin (ANCEF) 2-4 GM/100ML-% IVPB  Status:  Discontinued       Note to Pharmacy: Odelia Gage   : cabinet override      09/03/21 1300 09/04/21 0026     .  He was given sequential compression devices, early ambulation, and appropriate chemoprophylaxis for DVT prophylaxis.  He benefited maximally from the hospital stay and there were no complications.    Recent vital signs:  Vitals:   09/03/21 1835 09/03/21 1900  BP: (!) 162/97 (!) 166/92   Pulse: 85 91  Resp: 15 19  Temp:  (!) 97 F (36.1 C)  SpO2: 99% 98%    Recent laboratory studies:  Lab Results  Component Value Date   HGB 8.2 (L) 09/03/2021   HGB 10.2 (L) 08/29/2021   HGB 9.3 (L) 07/11/2021   Lab Results  Component Value Date   WBC 14.1 (H) 07/11/2021   PLT 434 (H) 07/11/2021   Lab Results  Component Value Date   INR 1.5 (H) 07/01/2021   Lab Results  Component Value Date   NA 134 (L) 09/03/2021   K 4.4 09/03/2021   CL 103 09/03/2021   CO2 19 (L) 07/11/2021   BUN 48 (H) 09/03/2021   CREATININE 3.40 (H) 09/03/2021   GLUCOSE 200 (H) 09/03/2021    Discharge Medications:   Allergies as of 09/03/2021   No Known Allergies      Medication List     TAKE these medications    acetaminophen 325 MG tablet Commonly known as: TYLENOL Take 2 tablets (650 mg total) by mouth every 4 (four) hours as needed for mild pain (or temp > 37.5 C (99.5 F)).   amLODipine 10 MG tablet Commonly known as: NORVASC Take 10 mg by mouth daily.   atorvastatin 80 MG tablet Commonly known as: LIPITOR Take 1 tablet (80 mg total) by mouth every evening.  ceFAZolin  IVPB Commonly known as: ANCEF Inject 2 g into the vein every 12 (twelve) hours.   dorzolamide-timolol 22.3-6.8 MG/ML ophthalmic solution Commonly known as: COSOPT Place 1 drop into the left eye 2 (two) times daily.   Eliquis 5 MG Tabs tablet Generic drug: apixaban TAKE 1 TABLET BY MOUTH TWICE A DAY   glipiZIDE 5 MG tablet Commonly known as: Glucotrol Take 1 tablet (5 mg total) by mouth daily.   hydrOXYzine 25 MG tablet Commonly known as: ATARAX/VISTARIL Take 25 mg by mouth every 8 (eight) hours as needed for anxiety.   loratadine 10 MG tablet Commonly known as: CLARITIN Take 10 mg by mouth daily.   metoprolol tartrate 100 MG tablet Commonly known as: LOPRESSOR Take 1 tablet (100 mg total) by mouth 2 (two) times daily.   multivitamin Tabs tablet Take 1 tablet by mouth daily.   mupirocin  ointment 2 % Commonly known as: BACTROBAN Apply 1 application topically 2 (two) times daily.   oxyCODONE 5 MG immediate release tablet Commonly known as: Oxy IR/ROXICODONE Take 1 tablet (5 mg total) by mouth every 6 (six) hours as needed for severe pain.   pantoprazole 40 MG tablet Commonly known as: PROTONIX Take 1 tablet (40 mg total) by mouth daily.   prednisoLONE acetate 1 % ophthalmic suspension Commonly known as: PRED FORTE Place 1 drop into the left eye every 6 (six) hours.   sodium bicarbonate 650 MG tablet Take 1 tablet (650 mg total) by mouth 2 (two) times daily.   traMADol 50 MG tablet Commonly known as: ULTRAM Take 1 tablet (50 mg total) by mouth daily as needed for moderate pain. What changed: Another medication with the same name was added. Make sure you understand how and when to take each.   traMADol 50 MG tablet Commonly known as: ULTRAM Take 1-2 tablets (50-100 mg total) by mouth daily as needed. What changed: You were already taking a medication with the same name, and this prescription was added. Make sure you understand how and when to take each.   Victoza 18 MG/3ML Sopn Generic drug: liraglutide Inject 1.8 mg into the skin daily.   zolpidem 10 MG tablet Commonly known as: AMBIEN Take 10 mg by mouth at bedtime as needed for sleep.       ASK your doctor about these medications    metroNIDAZOLE 500 MG tablet Commonly known as: FLAGYL Take 1 tablet (500 mg total) by mouth 2 (two) times daily for 21 days. Ask about: Should I take this medication?        Diagnostic Studies: MR FOOT LEFT WO CONTRAST  Result Date: 08/20/2021 CLINICAL DATA:  Foot swelling, diabetic, osteomyelitis suspected, xray done EXAM: MRI OF THE LEFT FOOT WITHOUT CONTRAST TECHNIQUE: Multiplanar, multisequence MR imaging of the left forefoot was performed. No intravenous contrast was administered. COMPARISON:  X-ray 08/13/2021, MRI 07/02/2021 FINDINGS: Bones/Joint/Cartilage  Interval postoperative changes from fifth ray resection at the mid fifth metatarsal. There is bone marrow edema throughout the residual fifth metatarsal extending to the fifth metatarsal base with intermediate bone marrow signal distally compatible with osteomyelitis (series 7, image 15). Destructive changes of the base of the fourth toe proximal phalanx with confluent low T1 signal changes. Marked bone marrow edema throughout the entirety of the fourth metatarsal bone with confluent low T1 signal changes extending from the fourth metatarsal head to the mid diaphysis compatible with acute osteomyelitis. Complex fourth MTP joint effusion most likely reflecting septic arthritis. Mild bone marrow edema within  the third metatarsal head and third toe proximal phalanx with preserved T1 marrow signal, which may reflect reactive osteitis. First and second rays are intact and demonstrate normal bone marrow signal. Ligaments Intact Lisfranc ligament. Muscles and Tendons Extensive increased T2 signal throughout the intrinsic foot musculature likely representing a combination of denervation changes and myositis. No tenosynovitis. Soft tissues Surgical wound at the lateral aspect of the forefoot. Extensive soft tissue edema. No organized fluid collection. IMPRESSION: 1. Interval postoperative changes from fifth ray resection at the mid fifth metatarsal. Acute osteomyelitis of the residual fifth metatarsal 2. Acute osteomyelitis throughout the fourth metatarsal and base of the fourth toe proximal phalanx. 3. Complex fourth MTP joint effusion most likely reflecting septic arthritis. 4. Mild bone marrow edema within the third metatarsal head and third toe proximal phalanx, which may reflect reactive osteitis. 5. Extensive increased T2 signal throughout the intrinsic foot musculature likely representing a combination of denervation changes and myositis. These results will be called to the ordering clinician or representative by the  Radiologist Assistant, and communication documented in the PACS or [employer]. Electronically Signed   By: Davina Poke D.O.   On: 08/20/2021 16:46   DG Foot Complete Left  Result Date: 08/14/2021 CLINICAL DATA:  Osteomyelitis. EXAM: LEFT FOOT - COMPLETE 3+ VIEW COMPARISON:  MRI left foot 07/02/2021.  Left foot x-ray 07/01/2021. FINDINGS: There is been interval amputation of the fourth and fifth toes as well as the fourth and fifth distal metatarsals. There is some new cortical irregularity along the lateral margin of the distal third metatarsal. Osteomyelitis cannot be excluded at this level. There is no acute fracture or dislocation. Peripheral vascular calcifications are present. IMPRESSION: 1. 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 2. Cortical irregularity of the lateral aspect of the third distal metatarsal. Osteomyelitis cannot be excluded. Electronically Signed   By: Ronney Asters M.D.   On: 08/14/2021 20:33    Disposition: Discharge disposition: 01-Home or Self Care       Discharge Instructions     Call MD / Call 911   Complete by: As directed    If you experience chest pain or shortness of breath, CALL 911 and be transported to the hospital emergency room.  If you develope a fever above 101.5 F, pus (white drainage) or increased drainage or redness at the wound, or calf pain, call your surgeon's office.   Constipation Prevention   Complete by: As directed    Drink plenty of fluids.  Prune juice may be helpful.  You may use a stool softener, such as Colace (over the counter) 100 mg twice a day.  Use MiraLax (over the counter) for constipation as needed.   Driving restrictions   Complete by: As directed    No driving while taking narcotic pain meds.   Increase activity slowly as tolerated   Complete by: As directed    Post-operative opioid taper instructions:   Complete by: As directed    POST-OPERATIVE OPIOID TAPER  INSTRUCTIONS: It is important to wean off of your opioid medication as soon as possible. If you do not need pain medication after your surgery it is ok to stop day one. Opioids include: Codeine, Hydrocodone(Norco, Vicodin), Oxycodone(Percocet, oxycontin) and hydromorphone amongst others.  Long term and even short term use of opiods can cause: Increased pain response Dependence Constipation Depression Respiratory depression And more.  Withdrawal symptoms can include Flu like symptoms Nausea, vomiting And more Techniques to manage these  symptoms Hydrate well Eat regular healthy meals Stay active Use relaxation techniques(deep breathing, meditating, yoga) Do Not substitute Alcohol to help with tapering If you have been on opioids for less than two weeks and do not have pain than it is ok to stop all together.  Plan to wean off of opioids This plan should start within one week post op of your joint replacement. Maintain the same interval or time between taking each dose and first decrease the dose.  Cut the total daily intake of opioids by one tablet each day Next start to increase the time between doses. The last dose that should be eliminated is the evening dose.           Follow-up Information     Leandrew Koyanagi, MD Follow up in 2 week(s).   Specialty: Orthopedic Surgery Why: For wound re-check Contact information: [place] [phone] [phone]                  Signed: Eduard Roux 09/10/2021, 7:05 AM

## 2021-09-11 ENCOUNTER — Other Ambulatory Visit: Payer: Self-pay | Admitting: Infectious Disease

## 2021-09-12 NOTE — Telephone Encounter (Signed)
Continue flagyl? Thanks

## 2021-09-16 ENCOUNTER — Ambulatory Visit: Payer: BC Managed Care – PPO | Admitting: Orthopaedic Surgery

## 2021-09-17 ENCOUNTER — Ambulatory Visit (INDEPENDENT_AMBULATORY_CARE_PROVIDER_SITE_OTHER): Payer: BC Managed Care – PPO | Admitting: Orthopaedic Surgery

## 2021-09-17 ENCOUNTER — Other Ambulatory Visit: Payer: Self-pay

## 2021-09-17 ENCOUNTER — Encounter: Payer: Self-pay | Admitting: Orthopaedic Surgery

## 2021-09-17 DIAGNOSIS — E11628 Type 2 diabetes mellitus with other skin complications: Secondary | ICD-10-CM

## 2021-09-17 DIAGNOSIS — L089 Local infection of the skin and subcutaneous tissue, unspecified: Secondary | ICD-10-CM

## 2021-09-17 MED ORDER — METRONIDAZOLE 500 MG PO TABS: 500.0000 mg | ORAL_TABLET | Freq: Two times a day (BID) | ORAL | 0 refills | Status: DC

## 2021-09-17 NOTE — Progress Notes (Signed)
Post-Op Visit Note   Patient: Bruce Little           Date of Birth: 03-14-1971           MRN: [id number] Visit Date: 09/17/2021 PCP: [facility], [facility]   Assessment & Plan:  Chief Complaint:  Chief Complaint  Patient presents with   Left Foot - Post-op Follow-up   Visit Diagnoses:  1. Diabetic infection of left foot [facility])     Plan: Mr. 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  He notices some increased pain at nighttime.  Overall no real complaints.  He has been getting IV antibiotics as scheduled.  He did run out of Flagyl.  The incisional VAC was removed today and the surgical incision is well approximated.  No evidence of dehiscence or infection.  There is no malodor.  We we will leave the sutures in for another week.  Dry dressing was applied with slight compression with Ace bandage.  Continue nonweightbearing.  Flagyl was refilled.  He will continue to take the IV antibiotics.  Recheck next week for suture removal.  Follow-Up Instructions: Return in about 1 week (around 09/24/2021).   Orders:  No orders of the defined types were placed in this encounter.  Meds ordered this encounter  Medications   metroNIDAZOLE (FLAGYL) 500 MG tablet    Sig: Take 1 tablet (500 mg total) by mouth 2 (two) times daily.    Dispense:  60 tablet    Refill:  0    Imaging: No results found.  PMFS History: Patient Active Problem List   Diagnosis Date Noted   Acute osteomyelitis of left foot ([place]) 09/03/2021   Osteomyelitis of fourth toe of left foot ([place]) 08/13/2021   Osteomyelitis of fifth toe of left foot ([place]) 08/13/2021   Mixed hyperlipidemia 07/31/2021   Paroxysmal A-fib ([place]) 07/30/2021   Medication monitoring encounter 07/28/2021   Carpal tunnel syndrome on right 07/10/2021   Diabetic infection of left foot ([place]) 07/03/2021   CKD (chronic kidney disease) stage 5, GFR less than 15 ml/min ([place]) 07/02/2021   Renal insufficiency 05/20/2021   DM2 (diabetes  mellitus, type 2) ([place]) 05/20/2021   Acute ischemic stroke ([employer]) 05/20/2021   HTN (hypertension) 05/20/2021   Past Medical History:  Diagnosis Date   A-fib ([place])    Chronic kidney disease    DM2 (diabetes mellitus, type 2) ([place])    History of blood transfusion    HTN (hypertension)    Osteomyelitis of fifth toe of left foot ([place]) 08/13/2021   Osteomyelitis of fourth toe of left foot ([place]) 08/13/2021   Stroke ([place]) 05/19/2021   no residual effects.    Family History  Problem Relation Age of Onset   Stroke Mother    Cancer Mother    Heart disease Father     Past Surgical History:  Procedure Laterality Date   AMPUTATION Left 08/29/2021   Procedure: AMPUTATION OF FOURTH TOE AND RAY ALONG WITH REMAINING FITH METATARSAL;  Surgeon: Leandrew Koyanagi, MD;  Location: [place];  Service: Orthopedics;  Laterality: Left;   AMPUTATION Left 09/03/2021   Procedure: LISFRANC AMPUTATION;  Surgeon: Leandrew Koyanagi, MD;  Location: [place];  Service: Orthopedics;  Laterality: Left;   APPLICATION OF WOUND VAC Left 07/07/2021   Procedure: APPLICATION OF WOUND VAC;  Surgeon: Leandrew Koyanagi, MD;  Location: [place];  Service: Orthopedics;  Laterality: Left;   APPLICATION OF WOUND VAC Left 08/29/2021   Procedure: APPLICATION  OF WOUND VAC;  Surgeon: Leandrew Koyanagi, MD;  Location: [place];  Service: Orthopedics;  Laterality: Left;   I & D EXTREMITY Left 07/03/2021   Procedure: IRRIGATION AND DEBRIDEMENT ,FIFTH RAY  AMPUTATION LEFT FOOT, , WOUND VAC PLACEMENT;  Surgeon: Leandrew Koyanagi, MD;  Location: [place];  Service: Orthopedics;  Laterality: Left;   I & D EXTREMITY Left 07/07/2021   Procedure: IRRIGATION AND DEBRIDEMENT LEFT FOOT;  Surgeon: Leandrew Koyanagi, MD;  Location: [place];  Service: Orthopedics;  Laterality: Left;   I & D EXTREMITY Left 08/29/2021   Procedure: IRRIGATION AND DEBRIDEMENT LEFT FOOT;  Surgeon: Leandrew Koyanagi, MD;  Location: [place];  Service: Orthopedics;  Laterality: Left;   IR FLUORO GUIDE CV LINE RIGHT   07/09/2021   IR US GUIDE VASC ACCESS RIGHT  07/09/2021   VITRECTOMY Left    [place] [facility]   Social History   Occupational History   Not on file  Tobacco Use   Smoking status: Never   Smokeless tobacco: Never  Vaping Use   Vaping Use: Never used  Substance and Sexual Activity   Alcohol use: Not Currently    Comment: rare   Drug use: Never   Sexual activity: Not on file

## 2021-09-24 ENCOUNTER — Other Ambulatory Visit: Payer: Self-pay

## 2021-09-24 ENCOUNTER — Encounter: Payer: Self-pay | Admitting: Orthopaedic Surgery

## 2021-09-24 ENCOUNTER — Ambulatory Visit (INDEPENDENT_AMBULATORY_CARE_PROVIDER_SITE_OTHER): Payer: BC Managed Care – PPO | Admitting: Orthopaedic Surgery

## 2021-09-24 DIAGNOSIS — L089 Local infection of the skin and subcutaneous tissue, unspecified: Secondary | ICD-10-CM

## 2021-09-24 DIAGNOSIS — E11628 Type 2 diabetes mellitus with other skin complications: Secondary | ICD-10-CM

## 2021-09-24 MED ORDER — MUPIROCIN 2 % EX OINT: 1.0000 "application " | TOPICAL_OINTMENT | Freq: Two times a day (BID) | CUTANEOUS | 2 refills | Status: DC

## 2021-09-24 MED ORDER — HYDROCODONE-ACETAMINOPHEN 5-325 MG PO TABS: 1.0000 | ORAL_TABLET | Freq: Four times a day (QID) | ORAL | 0 refills | Status: DC | PRN

## 2021-09-24 NOTE — Progress Notes (Signed)
Post-Op Visit Note   Patient: Bruce Little           Date of Birth: June 20, 1971           MRN: [id number] Visit Date: 09/24/2021 PCP: Phys[facility], [place]:  Chief Complaint:  Chief Complaint  Patient presents with   Left Foot - Pain   Visit Diagnoses:  1. Diabetic infection of left foot [facility])     Plan: Bruce Little returns today for 3-week postop check status post left Lisfranc amputation.  Overall doing well.  No real complaints.  Surgical incision is healed.  No signs of infection.  Sutures removed today.  Apply mupirocin to a small area of the incision twice a day with a dry compressive bandage.  We have made a referral to [facility] to start of prosthetic fitting process.  They will send me updated lab work on inflammatory markers when they are available.  Recheck in 2 weeks.  Follow-Up Instructions: Return in about 2 weeks (around 10/08/2021).   Orders:  No orders of the defined types were placed in this encounter.  Meds ordered this encounter  Medications   mupirocin ointment (BACTROBAN) 2 %    Sig: Apply 1 application topically 2 (two) times daily.    Dispense:  22 g    Refill:  2   HYDROcodone-acetaminophen (NORCO) 5-325 MG tablet    Sig: Take 1 tablet by mouth every 6 (six) hours as needed.    Dispense:  40 tablet    Refill:  0    Imaging: No results found.  PMFS History: Patient Active Problem List   Diagnosis Date Noted   Acute osteomyelitis of left foot ([place]) 09/03/2021   Osteomyelitis of fourth toe of left foot ([place]) 08/13/2021   Osteomyelitis of fifth toe of left foot ([place]) 08/13/2021   Mixed hyperlipidemia 07/31/2021   Paroxysmal A-fib ([place]) 07/30/2021   Medication monitoring encounter 07/28/2021   Carpal tunnel syndrome on right 07/10/2021   Diabetic infection of left foot ([place]) 07/03/2021   CKD (chronic kidney disease) stage 5, GFR less than 15 ml/min ([place]) 07/02/2021   Renal insufficiency 05/20/2021   DM2  (diabetes mellitus, type 2) ([place]) 05/20/2021   Acute ischemic stroke ([place]) 05/20/2021   HTN (hypertension) 05/20/2021   Past Medical History:  Diagnosis Date   A-fib ([place])    Chronic kidney disease    DM2 (diabetes mellitus, type 2) ([place])    History of blood transfusion    HTN (hypertension)    Osteomyelitis of fifth toe of left foot ([place]) 08/13/2021   Osteomyelitis of fourth toe of left foot ([place]) 08/13/2021   Stroke ([place]) 05/19/2021   no residual effects.    Family History  Problem Relation Age of Onset   Stroke Mother    Cancer Mother    Heart disease Father     Past Surgical History:  Procedure Laterality Date   AMPUTATION Left 08/29/2021   Procedure: AMPUTATION OF FOURTH TOE AND RAY ALONG WITH REMAINING FITH METATARSAL;  Surgeon: Leandrew Koyanagi, MD;  Location: [place];  Service: Orthopedics;  Laterality: Left;   AMPUTATION Left 09/03/2021   Procedure: LISFRANC AMPUTATION;  Surgeon: Leandrew Koyanagi, MD;  Location: [place];  Service: Orthopedics;  Laterality: Left;   APPLICATION OF WOUND VAC Left 07/07/2021   Procedure: APPLICATION OF WOUND VAC;  Surgeon: Leandrew Koyanagi, MD;  Location: [place];  Service: Orthopedics;  Laterality: Left;   APPLICATION OF  WOUND VAC Left 08/29/2021   Procedure: APPLICATION OF WOUND VAC;  Surgeon: Leandrew Koyanagi, MD;  Location: [place];  Service: Orthopedics;  Laterality: Left;   I & D EXTREMITY Left 07/03/2021   Procedure: IRRIGATION AND DEBRIDEMENT ,FIFTH RAY  AMPUTATION LEFT FOOT, , WOUND VAC PLACEMENT;  Surgeon: Leandrew Koyanagi, MD;  Location: [place];  Service: Orthopedics;  Laterality: Left;   I & D EXTREMITY Left 07/07/2021   Procedure: IRRIGATION AND DEBRIDEMENT LEFT FOOT;  Surgeon: Leandrew Koyanagi, MD;  Location: [place];  Service: Orthopedics;  Laterality: Left;   I & D EXTREMITY Left 08/29/2021   Procedure: IRRIGATION AND DEBRIDEMENT LEFT FOOT;  Surgeon: Leandrew Koyanagi, MD;  Location: [place];  Service: Orthopedics;  Laterality: Left;   IR FLUORO GUIDE CV LINE  RIGHT  07/09/2021   IR US GUIDE VASC ACCESS RIGHT  07/09/2021   VITRECTOMY Left     eye   Social History   Occupational History   Not on file  Tobacco Use   Smoking status: Never   Smokeless tobacco: Never  Vaping Use   Vaping Use: Never used  Substance and Sexual Activity   Alcohol use: Not Currently    Comment: rare   Drug use: Never   Sexual activity: Not on file

## 2021-09-29 ENCOUNTER — Telehealth: Payer: Self-pay

## 2021-09-29 NOTE — Telephone Encounter (Signed)
Looks like the duration was up in the air when he saw Dr Tommy Medal in October. Will defer to him.

## 2021-09-29 NOTE — Telephone Encounter (Signed)
Patient would rather have picc line removed and do oral antibiotics. Patient is scheduled to follow up with the surgeon on 11/30 and will follow up with Dr. Tommy Medal on 10/13/21. Please sent oral antibiotics to the pharmacy for the patient. Bruce Little T Brooks [name]

## 2021-09-29 NOTE — Telephone Encounter (Signed)
Patient is calling stating he is on his last dose of IV antibiotics. He would like to know when is the last day of IV antibiotics suppose to be and when can picc line be pulled. Bruce Little T Brooks [name]

## 2021-09-29 NOTE — Telephone Encounter (Signed)
Patient had amputation done 09/03/21 and has still been receiving IV antibiotics

## 2021-09-29 NOTE — Telephone Encounter (Signed)
4 weeks post-operatively would be through this Wednesday, Dr. Tommy Medal. Would you like him to just continue orals through his appointment with you on 12/5? I can send in cefadroxil/flagyl BID (would only need 500mg  BID d/t CKD)? Unless you were thinking a different regimen. Estill Bamberg

## 2021-09-29 NOTE — Telephone Encounter (Signed)
Please advise 

## 2021-09-30 ENCOUNTER — Other Ambulatory Visit: Payer: Self-pay | Admitting: Cardiology

## 2021-09-30 ENCOUNTER — Other Ambulatory Visit: Payer: Self-pay | Admitting: Pharmacist

## 2021-09-30 DIAGNOSIS — I480 Paroxysmal atrial fibrillation: Secondary | ICD-10-CM

## 2021-09-30 DIAGNOSIS — E11628 Type 2 diabetes mellitus with other skin complications: Secondary | ICD-10-CM

## 2021-09-30 DIAGNOSIS — L089 Local infection of the skin and subcutaneous tissue, unspecified: Secondary | ICD-10-CM

## 2021-09-30 MED ORDER — METRONIDAZOLE 500 MG PO TABS: 500.0000 mg | ORAL_TABLET | Freq: Two times a day (BID) | ORAL | 0 refills | Status: DC

## 2021-09-30 MED ORDER — CEFADROXIL 500 MG PO CAPS: 500.0000 mg | ORAL_CAPSULE | Freq: Two times a day (BID) | ORAL | 0 refills | Status: DC

## 2021-09-30 NOTE — Telephone Encounter (Signed)
Called patient and discussed oral prescriptions. Sent to [facility] on [address].

## 2021-09-30 NOTE — Telephone Encounter (Signed)
Relayed verbal order to pull PICC orders to [place] at [place]. Thanks!

## 2021-09-30 NOTE — Telephone Encounter (Signed)
Will do! I am happy to send pull PICC orders to the [facility] agency, too, if you would like. Let me know.

## 2021-10-06 ENCOUNTER — Other Ambulatory Visit: Payer: Self-pay

## 2021-10-06 ENCOUNTER — Other Ambulatory Visit (HOSPITAL_COMMUNITY): Payer: Self-pay | Admitting: Infectious Disease

## 2021-10-06 DIAGNOSIS — M869 Osteomyelitis, unspecified: Secondary | ICD-10-CM

## 2021-10-06 NOTE — Telephone Encounter (Signed)
Patient called to inquire about CVC removal. Home health is not able to remove line in the home and will need to have it removed with IR. He is scheduled for 11/30 to have removed.   Order is placed; provider will need to approve it.

## 2021-10-07 ENCOUNTER — Other Ambulatory Visit: Payer: Self-pay | Admitting: Radiology

## 2021-10-08 ENCOUNTER — Ambulatory Visit (HOSPITAL_COMMUNITY)
Admission: RE | Admit: 2021-10-08 | Discharge: 2021-10-08 | Disposition: A | Discharge status: Home / Self Care | Payer: BC Managed Care – PPO | Source: Ambulatory Visit | Attending: Infectious Disease | Admitting: Infectious Disease

## 2021-10-08 ENCOUNTER — Encounter: Payer: Self-pay | Admitting: Orthopaedic Surgery

## 2021-10-08 ENCOUNTER — Other Ambulatory Visit: Payer: Self-pay

## 2021-10-08 ENCOUNTER — Ambulatory Visit (INDEPENDENT_AMBULATORY_CARE_PROVIDER_SITE_OTHER): Payer: BC Managed Care – PPO | Admitting: Physician Assistant

## 2021-10-08 DIAGNOSIS — Z452 Encounter for adjustment and management of vascular access device: Secondary | ICD-10-CM | POA: Diagnosis present

## 2021-10-08 DIAGNOSIS — M86172 Other acute osteomyelitis, left ankle and foot: Secondary | ICD-10-CM

## 2021-10-08 DIAGNOSIS — M869 Osteomyelitis, unspecified: Secondary | ICD-10-CM | POA: Diagnosis not present

## 2021-10-08 HISTORY — PX: IR REMOVAL TUN CV CATH W/O FL: IMG2289

## 2021-10-08 MED ORDER — ONDANSETRON HCL 4 MG PO TABS: 4.0000 mg | ORAL_TABLET | Freq: Three times a day (TID) | ORAL | 0 refills | Status: DC | PRN

## 2021-10-08 NOTE — Procedures (Signed)
Pre procedural Dx: Osteomyelitis s/p IV abx treatment Post procedural Dx: Same  Successful removal of tunneled central venous catheter. Catheter and cuff removed in tact. Sterile dressing applied - dressing to remain x 24 hours then may remove and shower. Do not submerge x 7 days.  EBL < 1 mL No immediate complications.  Please see imaging section of Epic for full dictation.  Joaquim Nam PA-C 10/08/2021 12:35 PM

## 2021-10-08 NOTE — Progress Notes (Signed)
Post-Op Visit Note   Patient: Bruce Little           Date of Birth: Apr 27, 1971           MRN: [id number] Visit Date: 10/08/2021 PCP: Physicians, [place]:  Chief Complaint:  Chief Complaint  Patient presents with   Left Foot - Pain   Visit Diagnoses:  1. Acute osteomyelitis of left foot (HCC)     Plan: Patient is a pleasant 50 year old who comes in today 5 weeks status post left foot Lisfranc amputation, date of surgery 09/03/2021.  He has been doing okay.  His main complaint is been a loss of energy and total body deconditioning from being immobile.  He has minimal pain and takes an occasional tramadol.  He has been compliant and apply mupirocin to the wound.  He recently got the stump shrinker has been wearing this.  He goes back next week for a shoe insert. Exam of the left foot shoes a nearly healed wound with a small, dry scab.  No evidence of infection or cellulitis.  At this point, he will continue with the stump shrinker.  He may begin weight bearing next week once he gets the shoe insert.  He will follow up with [place] in three weeks for recheck.    Follow-Up Instructions: Return in about 3 weeks (around 10/29/2021).   Orders:  No orders of the defined types were placed in this encounter.  Meds ordered this encounter  Medications   ondansetron (ZOFRAN) 4 MG tablet    Sig: Take 1 tablet (4 mg total) by mouth every 8 (eight) hours as needed for nausea or vomiting.    Dispense:  40 tablet    Refill:  0     Imaging: No new imaging  PMFS History: Patient Active Problem List   Diagnosis Date Noted   Acute osteomyelitis of left foot ([place]) 09/03/2021   Osteomyelitis of fourth toe of left foot ([place]) 08/13/2021   Osteomyelitis of fifth toe of left foot ([place]) 08/13/2021   Mixed hyperlipidemia 07/31/2021   Paroxysmal A-fib ([place]) 07/30/2021   Medication monitoring encounter 07/28/2021   Carpal tunnel syndrome on right 07/10/2021   Diabetic  infection of left foot ([place]) 07/03/2021   CKD (chronic kidney disease) stage 5, GFR less than 15 ml/min ([place]) 07/02/2021   Renal insufficiency 05/20/2021   DM2 (diabetes mellitus, type 2) ([place]) 05/20/2021   Acute ischemic stroke ([place]) 05/20/2021   HTN (hypertension) 05/20/2021   Past Medical History:  Diagnosis Date   A-fib ([place])    Chronic kidney disease    DM2 (diabetes mellitus, type 2) ([place])    History of blood transfusion    HTN (hypertension)    Osteomyelitis of fifth toe of left foot ([place]) 08/13/2021   Osteomyelitis of fourth toe of left foot ([place]) 08/13/2021   Stroke ([place]) 05/19/2021   no residual effects.    Family History  Problem Relation Age of Onset   Stroke Mother    Cancer Mother    Heart disease Father     Past Surgical History:  Procedure Laterality Date   AMPUTATION Left 08/29/2021   Procedure: AMPUTATION OF FOURTH TOE AND RAY ALONG WITH REMAINING FITH METATARSAL;  Surgeon: Leandrew Koyanagi, MD;  Location: [place];  Service: Orthopedics;  Laterality: Left;   AMPUTATION Left 09/03/2021   Procedure: LISFRANC AMPUTATION;  Surgeon: Leandrew Koyanagi, MD;  Location: [place];  Service: Orthopedics;  Laterality: Left;  APPLICATION OF WOUND VAC Left 07/07/2021   Procedure: APPLICATION OF WOUND VAC;  Surgeon: Leandrew Koyanagi, MD;  Location: [place];  Service: Orthopedics;  Laterality: Left;   APPLICATION OF WOUND VAC Left 08/29/2021   Procedure: APPLICATION OF WOUND VAC;  Surgeon: Leandrew Koyanagi, MD;  Location: [place];  Service: Orthopedics;  Laterality: Left;   I & D EXTREMITY Left 07/03/2021   Procedure: IRRIGATION AND DEBRIDEMENT ,FIFTH RAY  AMPUTATION LEFT FOOT, , WOUND VAC PLACEMENT;  Surgeon: Leandrew Koyanagi, MD;  Location: [place];  Service: Orthopedics;  Laterality: Left;   I & D EXTREMITY Left 07/07/2021   Procedure: IRRIGATION AND DEBRIDEMENT LEFT FOOT;  Surgeon: Leandrew Koyanagi, MD;  Location: [place];  Service: Orthopedics;  Laterality: Left;   I & D EXTREMITY Left 08/29/2021    Procedure: IRRIGATION AND DEBRIDEMENT LEFT FOOT;  Surgeon: Leandrew Koyanagi, MD;  Location: [place];  Service: Orthopedics;  Laterality: Left;   IR FLUORO GUIDE CV LINE RIGHT  07/09/2021   IR US GUIDE VASC ACCESS RIGHT  07/09/2021   VITRECTOMY Left    [place] [facility]   Social History   Occupational History   Not on file  Tobacco Use   Smoking status: Never   Smokeless tobacco: Never  Vaping Use   Vaping Use: Never used  Substance and Sexual Activity   Alcohol use: Not Currently    Comment: rare   Drug use: Never   Sexual activity: Not on file

## 2021-10-13 ENCOUNTER — Encounter: Payer: Self-pay | Admitting: Infectious Disease

## 2021-10-13 ENCOUNTER — Other Ambulatory Visit: Payer: Self-pay

## 2021-10-13 ENCOUNTER — Ambulatory Visit (INDEPENDENT_AMBULATORY_CARE_PROVIDER_SITE_OTHER): Payer: BC Managed Care – PPO | Admitting: Infectious Disease

## 2021-10-13 VITALS — BP 140/92 | HR 101 | Temp 97.5°F | Wt 189.8 lb

## 2021-10-13 DIAGNOSIS — E11628 Type 2 diabetes mellitus with other skin complications: Secondary | ICD-10-CM

## 2021-10-13 DIAGNOSIS — L089 Local infection of the skin and subcutaneous tissue, unspecified: Secondary | ICD-10-CM

## 2021-10-13 DIAGNOSIS — M869 Osteomyelitis, unspecified: Secondary | ICD-10-CM | POA: Diagnosis not present

## 2021-10-13 DIAGNOSIS — M86172 Other acute osteomyelitis, left ankle and foot: Secondary | ICD-10-CM

## 2021-10-13 DIAGNOSIS — E1159 Type 2 diabetes mellitus with other circulatory complications: Secondary | ICD-10-CM | POA: Diagnosis not present

## 2021-10-13 DIAGNOSIS — I10 Essential (primary) hypertension: Secondary | ICD-10-CM

## 2021-10-13 NOTE — Progress Notes (Signed)
Subjective:   Chief Complaint: Follow-up for diabetic foot infection with osteomyelitis  Patient ID: Bruce Little, male    DOB: 27-Feb-1971, 50 y.o.   MRN: [id number]  HPI  Bruce Little is a 50 year old Caucasian man with a past medical history significant for diabetes mellitus mellitus with neuropathy, chronic kidney disease stage V, hypertension hyperlipidemia paroxysmal atrial fibrillation who developed a diabetic foot infection with osteomyelitis involving his fifth toe and also possibly the fourth toe.  He had initially presented to the hospital August 24 with worsening of necrotizing infection in his foot.  He had an MRI performed which showed evidence of osteomyelitis of the fifth metatarsal head and then possible changes consistent with osteomyelitis of the fourth metatarsal head.  He had initially been started on vancomycin and Zosyn.  He was taken to the operating room by Dr. Erlinda Hong in August 25th , 2022 and performed irrigation and debridement of the foot with fifth ray amputation and placement of wound vacuum.  Cultures in the operating room yielded methicillin sensitive Staph aureus and Bacteroides.  He then went back to the operating room on the 29th and underwent further irrigation and debridement with revision of the amputation of the left fifth metatarsal and iceman of wound vacuum.  He was then placed on cefazolin and metronidazole after culture data came back and we had seen him formally the initial 3 weeks of cefazolin along with oral metronidazole.  He was seen in follow-up by my partner Dr. West Bali and she was happy with his progress but wanted check a sed rate and CRP prior to changing him to oral antibiotics.  His sedimentation rate came back in the 40s and CRP was also elevated and his cefazolin was extended along with metronidazole.  He was scheduled today in clinic with me several visits ago as there was no follow-up appointment available for him to see Dr. Leighton Parody  yet.    I reviewed his labs from home health at his visit with me and unfortunately his sedimentation rate has dramatically increased going from 46-102.  The repeat had normalized at 2 having been 6 already last time it was checked.  He did  not have any autoimmune conditions or inflammatory arthritis or history of gout or something else to explain his elevated inflammatory markers.  I was  obviously concerned about the fourth toe as there was suggestion of osteomyelitis there and that could be a reason why he is continue to have elevated inflammatory markers.  Obtained an x-ray of the foot which I have personally reviewed from August 14, 2021 which showed the interval amputation of fourth and fifth toes as well as fourth and fifth distal metatarsals with some new cortical irregularity on the lateral margin of the distal third metatarsal.  We subsequently ordered an MRI of the foot which was performed on the 10th 2022 which have personally reviewed.  This scan showed postoperative changes in the fifth ray resection site of the fifth metatarsal with osteomyelitis seen in the residual fifth metatarsal that seems acute by imaging as well as some acute osteomyelitis in the fourth metatarsal and base of the fourth toe proximal phalanx with a complex MTP joint effusion and bone marrow edema in the third metatarsal head and third toe proximal phalanx.  I got in touch with Dr. Erlinda Hong and we discussed the case. He brought [facility] to clinic  and has scheduled patient for surgery   I had continue the patient on cefazolin with metronidazole.  In the interim he underwent excisional debridement of left foot wound including bone muscle skin and tendon with disarticulation of the left fifth metatarsal and left fourth toe amputation left fourth toe metatarsal amputation and application of wound vacuum.  No new cultures were sent.  Unfortunately this attempt did not succeed in the on flume he underwent repeat surgery  in October 26 with debridement of left foot including skin subcutaneous tissue muscle and tendon with amputation of the left foot through the tract tarsometatarsal joint with a Lisfranc amputation.  He had remained on cefazolin and metronidazole through last week which point time we had the PICC line discontinued and he was placed on oral cefadroxil and oral metronidazole.  He has been following up with Dr. Erlinda Hong and has had no evidence of local infection at the amputation site.         Past Medical History:  Diagnosis Date   A-fib ([place])    Chronic kidney disease    DM2 (diabetes mellitus, type 2) ([place])    History of blood transfusion    HTN (hypertension)    Osteomyelitis of fifth toe of left foot ([place]) 08/13/2021   Osteomyelitis of fourth toe of left foot ([place]) 08/13/2021   Stroke ([place]) 05/19/2021   no residual effects.    Past Surgical History:  Procedure Laterality Date   AMPUTATION Left 08/29/2021   Procedure: AMPUTATION OF FOURTH TOE AND RAY ALONG WITH REMAINING FITH METATARSAL;  Surgeon: Leandrew Koyanagi, MD;  Location: [facility];  Service: Orthopedics;  Laterality: Left;   AMPUTATION Left 09/03/2021   Procedure: LISFRANC AMPUTATION;  Surgeon: Leandrew Koyanagi, MD;  Location: [place];  Service: Orthopedics;  Laterality: Left;   APPLICATION OF WOUND VAC Left 07/07/2021   Procedure: APPLICATION OF WOUND VAC;  Surgeon: Leandrew Koyanagi, MD;  Location: [place];  Service: Orthopedics;  Laterality: Left;   APPLICATION OF WOUND VAC Left 08/29/2021   Procedure: APPLICATION OF WOUND VAC;  Surgeon: Leandrew Koyanagi, MD;  Location: [place];  Service: Orthopedics;  Laterality: Left;   I & D EXTREMITY Left 07/03/2021   Procedure: IRRIGATION AND DEBRIDEMENT ,FIFTH RAY  AMPUTATION LEFT FOOT, , WOUND VAC PLACEMENT;  Surgeon: Leandrew Koyanagi, MD;  Location: [facility];  Service: Orthopedics;  Laterality: Left;   I & D EXTREMITY Left 07/07/2021   Procedure: IRRIGATION AND DEBRIDEMENT LEFT FOOT;  Surgeon: Leandrew Koyanagi,  MD;  Location: [facility];  Service: Orthopedics;  Laterality: Left;   I & D EXTREMITY Left 08/29/2021   Procedure: IRRIGATION AND DEBRIDEMENT LEFT FOOT;  Surgeon: Leandrew Koyanagi, MD;  Location: [place];  Service: Orthopedics;  Laterality: Left;   IR FLUORO GUIDE CV LINE RIGHT  07/09/2021   IR REMOVAL TUN CV CATH W/O FL  10/08/2021   IR US GUIDE VASC ACCESS RIGHT  07/09/2021   VITRECTOMY Left    [place] [facility]    Family History  Problem Relation Age of Onset   Stroke Mother    Cancer Mother    Heart disease Father       Social History   Socioeconomic History   Marital status: Married    Spouse name: Caryl Pina   Number of children: Not on file   Years of education: Not on file   Highest education level: Not on file  Occupational History   Not on file  Tobacco Use   Smoking status: Never   Smokeless tobacco: Never  Vaping Use   Vaping Use: Never  used  Substance and Sexual Activity   Alcohol use: Not Currently    Comment: rare   Drug use: Never   Sexual activity: Not on file  Other Topics Concern   Not on file  Social History Narrative   Not on file   Social Determinants of Health   Financial Resource Strain: Not on file  Food Insecurity: Not on file  Transportation Needs: Not on file  Physical Activity: Not on file  Stress: Not on file  Social Connections: Not on file    No Known Allergies   Current Outpatient Medications:    acetaminophen (TYLENOL) 325 MG tablet, Take 2 tablets (650 mg total) by mouth every 4 (four) hours as needed for mild pain (or temp > 37.5 C (99.5 F))., Disp: 30 tablet, Rfl: 0   amLODipine (NORVASC) 10 MG tablet, Take 10 mg by mouth daily., Disp: , Rfl:    atorvastatin (LIPITOR) 80 MG tablet, Take 1 tablet (80 mg total) by mouth every evening., Disp: 90 tablet, Rfl: 3   cefadroxil (DURICEF) 500 MG capsule, Take 1 capsule (500 mg total) by mouth 2 (two) times daily., Disp: 42 capsule, Rfl: 0   ceFAZolin (ANCEF) IVPB, Inject 2 g into the vein every  12 (twelve) hours., Disp: , Rfl:    dorzolamide-timolol (COSOPT) 22.3-6.8 MG/ML ophthalmic solution, Place 1 drop into the left eye 2 (two) times daily., Disp: , Rfl:    ELIQUIS 5 MG TABS tablet, TAKE 1 TABLET BY MOUTH TWICE A DAY, Disp: 180 tablet, Rfl: 1   glipiZIDE (GLUCOTROL) 5 MG tablet, Take 1 tablet (5 mg total) by mouth daily., Disp: 30 tablet, Rfl: 0   HYDROcodone-acetaminophen (NORCO) 5-325 MG tablet, Take 1 tablet by mouth every 6 (six) hours as needed., Disp: 40 tablet, Rfl: 0   hydrOXYzine (ATARAX/VISTARIL) 25 MG tablet, Take 25 mg by mouth every 8 (eight) hours as needed for anxiety., Disp: , Rfl:    loratadine (CLARITIN) 10 MG tablet, Take 10 mg by mouth daily., Disp: , Rfl:    metoprolol tartrate (LOPRESSOR) 100 MG tablet, Take 1 tablet (100 mg total) by mouth 2 (two) times daily., Disp: 180 tablet, Rfl: 3   metroNIDAZOLE (FLAGYL) 500 MG tablet, Take 1 tablet (500 mg total) by mouth 2 (two) times daily for 21 days., Disp: 42 tablet, Rfl: 0   multivitamin (ONE-A-DAY MEN'S) TABS tablet, Take 1 tablet by mouth daily., Disp: , Rfl:    mupirocin ointment (BACTROBAN) 2 %, Apply 1 application topically 2 (two) times daily., Disp: , Rfl:    mupirocin ointment (BACTROBAN) 2 %, Apply 1 application topically 2 (two) times daily., Disp: 22 g, Rfl: 2   ondansetron (ZOFRAN) 4 MG tablet, Take 1 tablet (4 mg total) by mouth every 8 (eight) hours as needed for nausea or vomiting., Disp: 40 tablet, Rfl: 0   oxyCODONE (OXY IR/ROXICODONE) 5 MG immediate release tablet, Take 1 tablet (5 mg total) by mouth every 6 (six) hours as needed for severe pain., Disp: 15 tablet, Rfl: 0   pantoprazole (PROTONIX) 40 MG tablet, Take 1 tablet (40 mg total) by mouth daily., Disp: 30 tablet, Rfl: 0   prednisoLONE acetate (PRED FORTE) 1 % ophthalmic suspension, Place 1 drop into the left eye every 6 (six) hours., Disp: , Rfl:    traMADol (ULTRAM) 50 MG tablet, Take 1 tablet (50 mg total) by mouth daily as needed for  moderate pain., Disp: 30 tablet, Rfl: 2   traMADol (ULTRAM) 50 MG tablet, Take  1-2 tablets (50-100 mg total) by mouth daily as needed., Disp: 60 tablet, Rfl: 0   VICTOZA 18 MG/3ML SOPN, Inject 1.8 mg into the skin daily., Disp: , Rfl:    zolpidem (AMBIEN) 10 MG tablet, Take 10 mg by mouth at bedtime as needed for sleep., Disp: , Rfl:    sodium bicarbonate 650 MG tablet, Take 1 tablet (650 mg total) by mouth 2 (two) times daily. (Patient not taking: Reported on 08/26/2021), Disp: 60 tablet, Rfl: 0   Review of Systems  Constitutional:  Negative for activity change, appetite change, chills, diaphoresis, fatigue, fever and unexpected weight change.  HENT:  Negative for congestion, rhinorrhea, sinus pressure, sneezing, sore throat and trouble swallowing.   Eyes:  Negative for photophobia and visual disturbance.  Respiratory:  Negative for cough, chest tightness, shortness of breath, wheezing and stridor.   Cardiovascular:  Negative for chest pain, palpitations and leg swelling.  Gastrointestinal:  Negative for abdominal distention, abdominal pain, anal bleeding, blood in stool, constipation, diarrhea, nausea and vomiting.  Genitourinary:  Negative for difficulty urinating, dysuria, flank pain and hematuria.  Musculoskeletal:  Negative for arthralgias, back pain, gait problem, joint swelling and myalgias.  Skin:  Positive for wound. Negative for color change, pallor and rash.  Neurological:  Negative for dizziness, tremors, weakness and light-headedness.  Hematological:  Negative for adenopathy. Does not bruise/bleed easily.  Psychiatric/Behavioral:  Negative for agitation, behavioral problems, confusion, decreased concentration, dysphoric mood and sleep disturbance.       Objective:   Physical Exam Constitutional:      Appearance: He is well-developed.  HENT:     Head: Normocephalic and atraumatic.  Eyes:     Conjunctiva/sclera: Conjunctivae normal.  Cardiovascular:     Rate and Rhythm:  Normal rate and regular rhythm.  Pulmonary:     Effort: Pulmonary effort is normal. No respiratory distress.     Breath sounds: No wheezing.  Abdominal:     General: There is no distension.     Palpations: Abdomen is soft.  Musculoskeletal:        General: No tenderness. Normal range of motion.     Cervical back: Normal range of motion and neck supple.  Skin:    General: Skin is warm and dry.     Coloration: Skin is not pale.     Findings: No erythema or rash.  Neurological:     General: No focal deficit present.     Mental Status: He is alert and oriented to person, place, and time.  Psychiatric:        Mood and Affect: Mood normal.        Behavior: Behavior normal.        Thought Content: Thought content normal.        Judgment: Judgment normal.    Left leg with shrinker in place      Assessment & Plan:  Diabetic foot ulcer with osteomyelitis that was polymicrobial with methicillin sensitive Staph aureus and Bacteroides isolated on culture   Has now undergone several surgeries and including now hopefully curative Lisfranc amputation.  I will check sed rate CRP BMP and CBC with differential.  Provided these labs are all reassuring he can complete the oral antibiotics he has and can follow-up with me on 23 December   Chronic kidney disease stage V:  Check a BMP today.  Diabetes mellitus: Following with PCP on oral medication  I spent 36 minutes with the patient including face to face counseling of the patient  personally reviewing radiographs, MRI of the foot along with pertinent cultures sed rate CRP BMP CBC with differential along with along with  review of medical records before and during the visit and in coordination of his care.

## 2021-10-14 LAB — CBC WITH DIFFERENTIAL/PLATELET
Absolute Monocytes: 816 cells/uL (ref 200–950)
Basophils Absolute: 27 cells/uL (ref 0–200)
Basophils Relative: 0.2 %
Eosinophils Absolute: 95 cells/uL (ref 15–500)
Eosinophils Relative: 0.7 %
HCT: 24.4 % — ABNORMAL LOW (ref 38.5–50.0)
Hemoglobin: 7.7 g/dL — ABNORMAL LOW (ref 13.2–17.1)
Lymphs Abs: 1183 cells/uL (ref 850–3900)
MCH: 27 pg (ref 27.0–33.0)
MCHC: 31.6 g/dL — ABNORMAL LOW (ref 32.0–36.0)
MCV: 85.6 fL (ref 80.0–100.0)
MPV: 10 fL (ref 7.5–12.5)
Monocytes Relative: 6 %
Neutro Abs: 11478 cells/uL — ABNORMAL HIGH (ref 1500–7800)
Neutrophils Relative %: 84.4 %
Platelets: 451 10*3/uL — ABNORMAL HIGH (ref 140–400)
RBC: 2.85 10*6/uL — ABNORMAL LOW (ref 4.20–5.80)
RDW: 14.4 % (ref 11.0–15.0)
Total Lymphocyte: 8.7 %
WBC: 13.6 10*3/uL — ABNORMAL HIGH (ref 3.8–10.8)

## 2021-10-14 LAB — BASIC METABOLIC PANEL WITH GFR
BUN/Creatinine Ratio: 21 (calc) (ref 6–22)
BUN: 57 mg/dL — ABNORMAL HIGH (ref 7–25)
CO2: 19 mmol/L — ABNORMAL LOW (ref 20–32)
Calcium: 8.2 mg/dL — ABNORMAL LOW (ref 8.6–10.3)
Chloride: 102 mmol/L (ref 98–110)
Creat: 2.73 mg/dL — ABNORMAL HIGH (ref 0.60–1.29)
Glucose, Bld: 176 mg/dL — ABNORMAL HIGH (ref 65–99)
Potassium: 5 mmol/L (ref 3.5–5.3)
Sodium: 134 mmol/L — ABNORMAL LOW (ref 135–146)
eGFR: 28 mL/min/{1.73_m2} — ABNORMAL LOW (ref >=60)

## 2021-10-14 LAB — SEDIMENTATION RATE: Sed Rate: >130 mm/h — ABNORMAL HIGH (ref 0–15)

## 2021-10-14 LAB — C-REACTIVE PROTEIN: CRP: 49.4 mg/L — ABNORMAL HIGH (ref <8.0)

## 2021-10-15 ENCOUNTER — Other Ambulatory Visit: Payer: Self-pay | Admitting: Infectious Disease

## 2021-10-15 ENCOUNTER — Telehealth: Payer: Self-pay

## 2021-10-15 DIAGNOSIS — E11628 Type 2 diabetes mellitus with other skin complications: Secondary | ICD-10-CM

## 2021-10-15 DIAGNOSIS — L089 Local infection of the skin and subcutaneous tissue, unspecified: Secondary | ICD-10-CM

## 2021-10-15 MED ORDER — CEFADROXIL 500 MG PO CAPS: 500.0000 mg | ORAL_CAPSULE | Freq: Two times a day (BID) | ORAL | 2 refills | Status: DC

## 2021-10-15 MED ORDER — METRONIDAZOLE 500 MG PO TABS: 500.0000 mg | ORAL_TABLET | Freq: Two times a day (BID) | ORAL | 2 refills | Status: AC

## 2021-10-15 NOTE — Telephone Encounter (Signed)
-----   Message from Truman Hayward, MD sent at 10/15/2021  8:37 AM EST ----- His sed rate and CRP are markedly elevated.  I would like him to continue on oral antibiotics and I have sent in new prescriptions for cefadroxil and metronidazole ----- Message ----- From: Interface, [facility] Lab Results In Sent: 10/13/2021   3:49 PM EST To: Truman Hayward, MD

## 2021-10-15 NOTE — Telephone Encounter (Signed)
Spoke with patient, relayed that his inflammatory markers are elevated and that Dr. Tommy Medal would like him to continue on oral antibiotics, that cefadroxil and metronidazole were sent to the pharmacy. Reminded him of upcoming appointment. Patient verbalized understanding and has no further questions.   Beryle Flock, RN

## 2021-10-19 ENCOUNTER — Encounter: Payer: Self-pay | Admitting: Infectious Disease

## 2021-10-19 ENCOUNTER — Encounter: Payer: Self-pay | Admitting: Orthopaedic Surgery

## 2021-10-20 ENCOUNTER — Telehealth: Payer: Self-pay | Admitting: Orthopaedic Surgery

## 2021-10-20 ENCOUNTER — Other Ambulatory Visit: Payer: Self-pay

## 2021-10-20 DIAGNOSIS — M86172 Other acute osteomyelitis, left ankle and foot: Secondary | ICD-10-CM

## 2021-10-20 DIAGNOSIS — E11628 Type 2 diabetes mellitus with other skin complications: Secondary | ICD-10-CM

## 2021-10-20 DIAGNOSIS — L089 Local infection of the skin and subcutaneous tissue, unspecified: Secondary | ICD-10-CM

## 2021-10-20 NOTE — Telephone Encounter (Signed)
Please let them know that we need to get another MRI without contrast of the foot to assess for infection.  Let's do this urgently please.  Thanks.

## 2021-10-20 NOTE — Telephone Encounter (Signed)
10/08/21 ov note emailed to [facility] with [facility]

## 2021-10-21 ENCOUNTER — Telehealth: Payer: Self-pay | Admitting: Orthopaedic Surgery

## 2021-10-21 ENCOUNTER — Telehealth: Payer: Self-pay

## 2021-10-21 ENCOUNTER — Ambulatory Visit
Admission: RE | Admit: 2021-10-21 | Discharge: 2021-10-21 | Disposition: A | Discharge status: Home / Self Care | Payer: BC Managed Care – PPO | Source: Ambulatory Visit | Attending: Orthopaedic Surgery | Admitting: Orthopaedic Surgery

## 2021-10-21 DIAGNOSIS — M86172 Other acute osteomyelitis, left ankle and foot: Secondary | ICD-10-CM

## 2021-10-21 DIAGNOSIS — L089 Local infection of the skin and subcutaneous tissue, unspecified: Secondary | ICD-10-CM

## 2021-10-21 DIAGNOSIS — E11628 Type 2 diabetes mellitus with other skin complications: Secondary | ICD-10-CM

## 2021-10-21 NOTE — Telephone Encounter (Signed)
I spoke to [place] today regarding the MRI which shows progression of his infection which has now caused osteo of the hindfoot.  [place] also reported that he has a couple of fevers this past weekend.  His inflammatory markers are still quite elevated from 12/5.  In light of these findings, I have recommended a BKA.  He is in agreement with this.  I will not be able to perform the surgery until next week.  Until then, he will continue to take his antibiotics.  All questions answered.

## 2021-10-21 NOTE — Telephone Encounter (Signed)
[facility] called wanted to make sure you were aware of the MRI results.

## 2021-10-21 NOTE — Telephone Encounter (Signed)
Yes I saw them.  I will call them this evening.

## 2021-10-27 ENCOUNTER — Other Ambulatory Visit: Payer: Self-pay

## 2021-10-27 ENCOUNTER — Other Ambulatory Visit (HOSPITAL_COMMUNITY)
Admission: RE | Admit: 2021-10-27 | Discharge: 2021-10-27 | Disposition: A | Discharge status: Home / Self Care | Payer: BC Managed Care – PPO | Source: Ambulatory Visit | Attending: Orthopaedic Surgery | Admitting: Orthopaedic Surgery

## 2021-10-27 ENCOUNTER — Ambulatory Visit (HOSPITAL_COMMUNITY)
Admission: RE | Admit: 2021-10-27 | Discharge: 2021-10-27 | Disposition: A | Discharge status: Home / Self Care | Payer: BC Managed Care – PPO | Source: Ambulatory Visit | Attending: Internal Medicine | Admitting: Internal Medicine

## 2021-10-27 VITALS — BP 112/59 | HR 93 | Resp 19

## 2021-10-27 DIAGNOSIS — Z01812 Encounter for preprocedural laboratory examination: Secondary | ICD-10-CM | POA: Insufficient documentation

## 2021-10-27 DIAGNOSIS — Z01818 Encounter for other preprocedural examination: Secondary | ICD-10-CM

## 2021-10-27 DIAGNOSIS — Z20822 Contact with and (suspected) exposure to covid-19: Secondary | ICD-10-CM | POA: Insufficient documentation

## 2021-10-27 DIAGNOSIS — N185 Chronic kidney disease, stage 5: Secondary | ICD-10-CM | POA: Insufficient documentation

## 2021-10-27 LAB — POCT HEMOGLOBIN-HEMACUE: Hemoglobin: 7.8 g/dL — ABNORMAL LOW (ref 13.0–17.0)

## 2021-10-27 LAB — SARS CORONAVIRUS 2 (TAT 6-24 HRS): SARS Coronavirus 2: NEGATIVE

## 2021-10-27 MED ORDER — EPOETIN ALFA-EPBX 40000 UNIT/ML IJ SOLN: 30000.0000 [IU] | INTRAMUSCULAR | Status: DC

## 2021-10-27 MED ORDER — EPOETIN ALFA-EPBX 40000 UNIT/ML IJ SOLN
INTRAMUSCULAR | Status: AC
  Administered 2021-10-27: 14:00:00 30000 [IU] via SUBCUTANEOUS
  Filled 2021-10-27: qty 1

## 2021-10-28 ENCOUNTER — Ambulatory Visit: Payer: BC Managed Care – PPO | Admitting: Orthopaedic Surgery

## 2021-10-28 ENCOUNTER — Other Ambulatory Visit: Payer: Self-pay

## 2021-10-28 ENCOUNTER — Encounter (HOSPITAL_COMMUNITY): Payer: Self-pay | Admitting: Orthopaedic Surgery

## 2021-10-28 NOTE — Progress Notes (Signed)
Spoke with pt for pre-op call. Pt has hx of A-fib, he is on Eliquis. He states his last dose was Sunday, 10/26/21 AM dose. Dr. Virgina Jock is his cardiologist, last office visit was 07/31/21. Pt is a type 2 diabetic. Last A1C was 8.4 on 07/02/21. Pt states his fasting blood sugar is usually around 120. Pt instructed not to take Victoza or Glipizide in the AM. Instructed him to check his blood sugar when he gets up and every 2 hours until he leaves for the hospital. If blood sugar is 70 or below, treat with 1/2 cup of clear juice (apple or cranberry) and recheck blood sugar 15 minutes after drinking juice. If blood sugar continues to be 70 or below, call the Short Stay department and ask to speak to a nurse. Pt voiced understanding.  Covid test done 10/27/21 and it's negative.

## 2021-10-29 ENCOUNTER — Other Ambulatory Visit: Payer: Self-pay

## 2021-10-29 ENCOUNTER — Inpatient Hospital Stay (HOSPITAL_COMMUNITY)
Admission: RE | Admit: 2021-10-29 | Discharge: 2021-10-31 | DRG: 617 | Disposition: A | Discharge status: Home Health | Payer: BC Managed Care – PPO | Source: Ambulatory Visit | Attending: Orthopaedic Surgery | Admitting: Orthopaedic Surgery

## 2021-10-29 ENCOUNTER — Inpatient Hospital Stay (HOSPITAL_COMMUNITY): Payer: BC Managed Care – PPO | Admitting: Anesthesiology

## 2021-10-29 ENCOUNTER — Encounter (HOSPITAL_COMMUNITY)
Admission: RE | Disposition: A | Discharge status: Home / Self Care | Payer: Self-pay | Source: Ambulatory Visit | Attending: Orthopaedic Surgery

## 2021-10-29 ENCOUNTER — Encounter (HOSPITAL_COMMUNITY): Payer: Self-pay | Admitting: Orthopaedic Surgery

## 2021-10-29 ENCOUNTER — Encounter: Payer: Self-pay | Admitting: Cardiology

## 2021-10-29 DIAGNOSIS — Z8673 Personal history of transient ischemic attack (TIA), and cerebral infarction without residual deficits: Secondary | ICD-10-CM | POA: Diagnosis not present

## 2021-10-29 DIAGNOSIS — Z8249 Family history of ischemic heart disease and other diseases of the circulatory system: Secondary | ICD-10-CM | POA: Diagnosis not present

## 2021-10-29 DIAGNOSIS — D62 Acute posthemorrhagic anemia: Secondary | ICD-10-CM | POA: Diagnosis not present

## 2021-10-29 DIAGNOSIS — I129 Hypertensive chronic kidney disease with stage 1 through stage 4 chronic kidney disease, or unspecified chronic kidney disease: Secondary | ICD-10-CM | POA: Diagnosis present

## 2021-10-29 DIAGNOSIS — M869 Osteomyelitis, unspecified: Secondary | ICD-10-CM | POA: Diagnosis present

## 2021-10-29 DIAGNOSIS — L089 Local infection of the skin and subcutaneous tissue, unspecified: Secondary | ICD-10-CM | POA: Diagnosis present

## 2021-10-29 DIAGNOSIS — E1122 Type 2 diabetes mellitus with diabetic chronic kidney disease: Secondary | ICD-10-CM | POA: Diagnosis present

## 2021-10-29 DIAGNOSIS — Z79899 Other long term (current) drug therapy: Secondary | ICD-10-CM

## 2021-10-29 DIAGNOSIS — E1169 Type 2 diabetes mellitus with other specified complication: Secondary | ICD-10-CM | POA: Diagnosis present

## 2021-10-29 DIAGNOSIS — Z809 Family history of malignant neoplasm, unspecified: Secondary | ICD-10-CM

## 2021-10-29 DIAGNOSIS — E11628 Type 2 diabetes mellitus with other skin complications: Secondary | ICD-10-CM | POA: Diagnosis present

## 2021-10-29 DIAGNOSIS — Z7901 Long term (current) use of anticoagulants: Secondary | ICD-10-CM

## 2021-10-29 DIAGNOSIS — N189 Chronic kidney disease, unspecified: Secondary | ICD-10-CM | POA: Diagnosis present

## 2021-10-29 DIAGNOSIS — I4891 Unspecified atrial fibrillation: Secondary | ICD-10-CM | POA: Diagnosis present

## 2021-10-29 DIAGNOSIS — Z20822 Contact with and (suspected) exposure to covid-19: Secondary | ICD-10-CM | POA: Diagnosis present

## 2021-10-29 DIAGNOSIS — Z823 Family history of stroke: Secondary | ICD-10-CM | POA: Diagnosis not present

## 2021-10-29 HISTORY — DX: Anemia, unspecified: D64.9

## 2021-10-29 HISTORY — DX: Pneumonia, unspecified organism: J18.9

## 2021-10-29 HISTORY — DX: COVID-19: U07.1

## 2021-10-29 HISTORY — PX: AMPUTATION: SHX166

## 2021-10-29 LAB — GLUCOSE, CAPILLARY
Glucose-Capillary: 123 mg/dL — ABNORMAL HIGH (ref 70–99)
Glucose-Capillary: 146 mg/dL — ABNORMAL HIGH (ref 70–99)
Glucose-Capillary: 122 mg/dL — ABNORMAL HIGH (ref 70–99)
Glucose-Capillary: 245 mg/dL — ABNORMAL HIGH (ref 70–99)

## 2021-10-29 LAB — BASIC METABOLIC PANEL
Anion gap: 11 (ref 5–15)
BUN: 55 mg/dL — ABNORMAL HIGH (ref 6–20)
CO2: 18 mmol/L — ABNORMAL LOW (ref 22–32)
Calcium: 8.9 mg/dL (ref 8.9–10.3)
Chloride: 101 mmol/L (ref 98–111)
Creatinine, Ser: 3.02 mg/dL — ABNORMAL HIGH (ref 0.61–1.24)
GFR, Estimated: 24 mL/min — ABNORMAL LOW (ref >60)
Glucose, Bld: 126 mg/dL — ABNORMAL HIGH (ref 70–99)
Potassium: 4.6 mmol/L (ref 3.5–5.1)
Sodium: 130 mmol/L — ABNORMAL LOW (ref 135–145)

## 2021-10-29 LAB — CBC
HCT: 27.9 % — ABNORMAL LOW (ref 39.0–52.0)
Hemoglobin: 8.7 g/dL — ABNORMAL LOW (ref 13.0–17.0)
MCH: 27.2 pg (ref 26.0–34.0)
MCHC: 31.2 g/dL (ref 30.0–36.0)
MCV: 87.2 fL (ref 80.0–100.0)
Platelets: 446 10*3/uL — ABNORMAL HIGH (ref 150–400)
RBC: 3.2 MIL/uL — ABNORMAL LOW (ref 4.22–5.81)
RDW: 14.6 % (ref 11.5–15.5)
WBC: 16.1 10*3/uL — ABNORMAL HIGH (ref 4.0–10.5)
nRBC: 0 % (ref 0.0–0.2)

## 2021-10-29 SURGERY — AMPUTATION BELOW KNEE
Anesthesia: Regional | Site: Knee | Laterality: Left

## 2021-10-29 MED ORDER — SORBITOL 70 % SOLN: 30.0000 mL | Freq: Every day | Status: DC | PRN

## 2021-10-29 MED ORDER — SODIUM CHLORIDE 0.9 % IR SOLN
Status: DC | PRN
  Administered 2021-10-29: 3000 mL

## 2021-10-29 MED ORDER — MIDAZOLAM HCL 2 MG/2ML IJ SOLN
INTRAMUSCULAR | Status: AC
  Administered 2021-10-29: 14:00:00 2 mg via INTRAVENOUS
  Filled 2021-10-29: qty 2

## 2021-10-29 MED ORDER — CEFAZOLIN SODIUM-DEXTROSE 2-4 GM/100ML-% IV SOLN
2.0000 g | Freq: Four times a day (QID) | INTRAVENOUS | Status: AC
  Administered 2021-10-29 – 2021-10-30 (×3): 2 g via INTRAVENOUS
  Filled 2021-10-29 (×3): qty 100

## 2021-10-29 MED ORDER — SODIUM CHLORIDE 0.9 % IV SOLN: INTRAVENOUS | Status: DC

## 2021-10-29 MED ORDER — ONDANSETRON HCL 4 MG/2ML IJ SOLN
INTRAMUSCULAR | Status: DC | PRN
  Administered 2021-10-29: 4 mg via INTRAVENOUS

## 2021-10-29 MED ORDER — DOCUSATE SODIUM 100 MG PO CAPS
100.0000 mg | ORAL_CAPSULE | Freq: Two times a day (BID) | ORAL | Status: DC
  Administered 2021-10-29 – 2021-10-31 (×4): 100 mg via ORAL
  Filled 2021-10-29 (×4): qty 1

## 2021-10-29 MED ORDER — OXYCODONE HCL 5 MG PO TABS: 5.0000 mg | ORAL_TABLET | Freq: Once | ORAL | Status: DC | PRN

## 2021-10-29 MED ORDER — PROPOFOL 10 MG/ML IV BOLUS
INTRAVENOUS | Status: DC | PRN
  Administered 2021-10-29: 200 mg via INTRAVENOUS

## 2021-10-29 MED ORDER — BUPIVACAINE LIPOSOME 1.3 % IJ SUSP
INTRAMUSCULAR | Status: DC | PRN
  Administered 2021-10-29: 10 mL via PERINEURAL

## 2021-10-29 MED ORDER — OXYCODONE HCL 5 MG PO TABS
10.0000 mg | ORAL_TABLET | ORAL | Status: DC | PRN
  Administered 2021-10-30 (×2): 15 mg via ORAL
  Administered 2021-10-30 – 2021-10-31 (×2): 10 mg via ORAL
  Filled 2021-10-29: qty 2
  Filled 2021-10-29: qty 3
  Filled 2021-10-29: qty 2
  Filled 2021-10-29 (×2): qty 3

## 2021-10-29 MED ORDER — HYDROMORPHONE HCL 1 MG/ML IJ SOLN: 0.2500 mg | INTRAMUSCULAR | Status: DC | PRN

## 2021-10-29 MED ORDER — ACETAMINOPHEN 325 MG PO TABS
325.0000 mg | ORAL_TABLET | Freq: Four times a day (QID) | ORAL | Status: DC | PRN
  Filled 2021-10-29: qty 2

## 2021-10-29 MED ORDER — ACETAMINOPHEN 500 MG PO TABS: 1000.0000 mg | ORAL_TABLET | Freq: Once | ORAL | Status: AC

## 2021-10-29 MED ORDER — APIXABAN 5 MG PO TABS: 5.0000 mg | ORAL_TABLET | Freq: Two times a day (BID) | ORAL | Status: DC

## 2021-10-29 MED ORDER — OXYCODONE HCL 5 MG/5ML PO SOLN: 5.0000 mg | Freq: Once | ORAL | Status: DC | PRN

## 2021-10-29 MED ORDER — ACETAMINOPHEN 500 MG PO TABS
ORAL_TABLET | ORAL | Status: AC
  Administered 2021-10-29: 13:00:00 1000 mg via ORAL
  Filled 2021-10-29: qty 2

## 2021-10-29 MED ORDER — LIDOCAINE 2% (20 MG/ML) 5 ML SYRINGE
INTRAMUSCULAR | Status: DC | PRN
  Administered 2021-10-29: 20 mg via INTRAVENOUS

## 2021-10-29 MED ORDER — LACTATED RINGERS IV SOLN: INTRAVENOUS | Status: DC

## 2021-10-29 MED ORDER — GLIPIZIDE 5 MG PO TABS
5.0000 mg | ORAL_TABLET | Freq: Every day | ORAL | Status: DC
  Administered 2021-10-30 – 2021-10-31 (×2): 5 mg via ORAL
  Filled 2021-10-29 (×2): qty 1

## 2021-10-29 MED ORDER — ONDANSETRON HCL 4 MG/2ML IJ SOLN: 4.0000 mg | Freq: Once | INTRAMUSCULAR | Status: DC | PRN

## 2021-10-29 MED ORDER — FENTANYL CITRATE (PF) 100 MCG/2ML IJ SOLN: 100.0000 ug | Freq: Once | INTRAMUSCULAR | Status: AC

## 2021-10-29 MED ORDER — FENTANYL CITRATE (PF) 250 MCG/5ML IJ SOLN: INTRAMUSCULAR | Status: DC | PRN

## 2021-10-29 MED ORDER — ORAL CARE MOUTH RINSE: 15.0000 mL | Freq: Once | OROMUCOSAL | Status: AC

## 2021-10-29 MED ORDER — FENTANYL CITRATE (PF) 100 MCG/2ML IJ SOLN
INTRAMUSCULAR | Status: AC
  Administered 2021-10-29: 14:00:00 100 ug via INTRAVENOUS
  Filled 2021-10-29: qty 2

## 2021-10-29 MED ORDER — HYDROMORPHONE HCL 1 MG/ML IJ SOLN
0.5000 mg | INTRAMUSCULAR | Status: DC | PRN
  Administered 2021-10-30 – 2021-10-31 (×4): 1 mg via INTRAVENOUS
  Filled 2021-10-29 (×5): qty 1

## 2021-10-29 MED ORDER — DIPHENHYDRAMINE HCL 12.5 MG/5ML PO ELIX: 25.0000 mg | ORAL_SOLUTION | ORAL | Status: DC | PRN

## 2021-10-29 MED ORDER — MIDAZOLAM HCL 2 MG/2ML IJ SOLN
INTRAMUSCULAR | Status: AC
  Filled 2021-10-29: qty 2

## 2021-10-29 MED ORDER — ACETAMINOPHEN 500 MG PO TABS
1000.0000 mg | ORAL_TABLET | Freq: Four times a day (QID) | ORAL | Status: AC
  Administered 2021-10-29 – 2021-10-30 (×4): 1000 mg via ORAL
  Filled 2021-10-29 (×4): qty 2

## 2021-10-29 MED ORDER — LIRAGLUTIDE 18 MG/3ML ~~LOC~~ SOPN: 1.8000 mg | PEN_INJECTOR | Freq: Every morning | SUBCUTANEOUS | Status: DC

## 2021-10-29 MED ORDER — FENTANYL CITRATE (PF) 250 MCG/5ML IJ SOLN
INTRAMUSCULAR | Status: AC
  Filled 2021-10-29: qty 5

## 2021-10-29 MED ORDER — CEFAZOLIN SODIUM-DEXTROSE 2-4 GM/100ML-% IV SOLN
INTRAVENOUS | Status: AC
  Filled 2021-10-29: qty 100

## 2021-10-29 MED ORDER — METHOCARBAMOL 500 MG PO TABS
500.0000 mg | ORAL_TABLET | Freq: Four times a day (QID) | ORAL | Status: DC | PRN
  Administered 2021-10-30 – 2021-10-31 (×4): 500 mg via ORAL
  Filled 2021-10-29 (×4): qty 1

## 2021-10-29 MED ORDER — METOCLOPRAMIDE HCL 5 MG PO TABS: 5.0000 mg | ORAL_TABLET | Freq: Three times a day (TID) | ORAL | Status: DC | PRN

## 2021-10-29 MED ORDER — CEFAZOLIN SODIUM-DEXTROSE 2-4 GM/100ML-% IV SOLN
2.0000 g | INTRAVENOUS | Status: AC
  Administered 2021-10-29: 16:00:00 2 g via INTRAVENOUS

## 2021-10-29 MED ORDER — POLYETHYLENE GLYCOL 3350 17 G PO PACK: 17.0000 g | PACK | Freq: Every day | ORAL | Status: DC | PRN

## 2021-10-29 MED ORDER — TRANEXAMIC ACID-NACL 1000-0.7 MG/100ML-% IV SOLN
1000.0000 mg | Freq: Once | INTRAVENOUS | Status: AC
  Administered 2021-10-29: 20:00:00 1000 mg via INTRAVENOUS
  Filled 2021-10-29: qty 100

## 2021-10-29 MED ORDER — AMLODIPINE BESYLATE 10 MG PO TABS
10.0000 mg | ORAL_TABLET | Freq: Every day | ORAL | Status: DC
  Administered 2021-10-30 – 2021-10-31 (×2): 10 mg via ORAL
  Filled 2021-10-29 (×2): qty 1

## 2021-10-29 MED ORDER — MIDAZOLAM HCL 2 MG/2ML IJ SOLN: 2.0000 mg | Freq: Once | INTRAMUSCULAR | Status: AC

## 2021-10-29 MED ORDER — METHOCARBAMOL 1000 MG/10ML IJ SOLN
500.0000 mg | Freq: Four times a day (QID) | INTRAVENOUS | Status: DC | PRN
  Filled 2021-10-29: qty 5

## 2021-10-29 MED ORDER — ONDANSETRON HCL 4 MG PO TABS: 4.0000 mg | ORAL_TABLET | Freq: Four times a day (QID) | ORAL | Status: DC | PRN

## 2021-10-29 MED ORDER — CHLORHEXIDINE GLUCONATE 0.12 % MT SOLN: 15.0000 mL | Freq: Once | OROMUCOSAL | Status: AC

## 2021-10-29 MED ORDER — ONDANSETRON HCL 4 MG/2ML IJ SOLN: 4.0000 mg | Freq: Four times a day (QID) | INTRAMUSCULAR | Status: DC | PRN

## 2021-10-29 MED ORDER — METOCLOPRAMIDE HCL 5 MG/ML IJ SOLN: 5.0000 mg | Freq: Three times a day (TID) | INTRAMUSCULAR | Status: DC | PRN

## 2021-10-29 MED ORDER — CHLORHEXIDINE GLUCONATE 0.12 % MT SOLN
OROMUCOSAL | Status: AC
  Administered 2021-10-29: 13:00:00 15 mL via OROMUCOSAL
  Filled 2021-10-29: qty 15

## 2021-10-29 MED ORDER — OXYCODONE HCL 5 MG PO TABS
5.0000 mg | ORAL_TABLET | ORAL | Status: DC | PRN
  Administered 2021-10-29 – 2021-10-30 (×2): 10 mg via ORAL
  Filled 2021-10-29 (×2): qty 2

## 2021-10-29 MED ORDER — METOPROLOL TARTRATE 100 MG PO TABS
100.0000 mg | ORAL_TABLET | Freq: Two times a day (BID) | ORAL | Status: DC
  Administered 2021-10-29 – 2021-10-31 (×4): 100 mg via ORAL
  Filled 2021-10-29 (×4): qty 1

## 2021-10-29 MED ORDER — BUPIVACAINE HCL 0.5 % IJ SOLN
INTRAMUSCULAR | Status: DC | PRN
  Administered 2021-10-29: 30 mL

## 2021-10-29 MED ORDER — SODIUM CHLORIDE 0.9 % IV SOLN
INTRAVENOUS | Status: AC | PRN
  Administered 2021-10-29: 1000 mL

## 2021-10-29 SURGICAL SUPPLY — 48 items
BAG COUNTER SPONGE SURGICOUNT (BAG) ×2 IMPLANT
BAG SURGICOUNT SPONGE COUNTING (BAG) ×1
BANDAGE ESMARK 6X9 LF (GAUZE/BANDAGES/DRESSINGS) ×1 IMPLANT
BLADE SAW RECIP 87.9 MT (BLADE) ×3 IMPLANT
BNDG COHESIVE 4X5 TAN STRL (GAUZE/BANDAGES/DRESSINGS) ×3 IMPLANT
BNDG COHESIVE 6X5 TAN STRL LF (GAUZE/BANDAGES/DRESSINGS) ×6 IMPLANT
BNDG ELASTIC 4X5.8 VLCR STR LF (GAUZE/BANDAGES/DRESSINGS) ×2 IMPLANT
BNDG ELASTIC 6X15 VLCR STRL LF (GAUZE/BANDAGES/DRESSINGS) ×2 IMPLANT
BNDG ESMARK 6X9 LF (GAUZE/BANDAGES/DRESSINGS) ×3
BNDG GAUZE ELAST 4 BULKY (GAUZE/BANDAGES/DRESSINGS) ×6 IMPLANT
COVER SURGICAL LIGHT HANDLE (MISCELLANEOUS) ×3 IMPLANT
CUFF TOURN SGL QUICK 34 (TOURNIQUET CUFF) ×2
CUFF TRNQT CYL 34X4.125X (TOURNIQUET CUFF) ×1 IMPLANT
DRAPE HALF SHEET 40X57 (DRAPES) ×6 IMPLANT
DRAPE ORTHO SPLIT 77X108 STRL (DRAPES) ×4
DRAPE SURG ORHT 6 SPLT 77X108 (DRAPES) IMPLANT
DRAPE U-SHAPE 47X51 STRL (DRAPES) ×3 IMPLANT
DRSG PAD ABDOMINAL 8X10 ST (GAUZE/BANDAGES/DRESSINGS) ×1 IMPLANT
ELECT CAUTERY BLADE 6.4 (BLADE) ×3 IMPLANT
ELECT REM PT RETURN 9FT ADLT (ELECTROSURGICAL) ×3
ELECTRODE REM PT RTRN 9FT ADLT (ELECTROSURGICAL) ×1 IMPLANT
EVACUATOR 1/8 PVC DRAIN (DRAIN) IMPLANT
GAUZE SPONGE 4X4 12PLY STRL (GAUZE/BANDAGES/DRESSINGS) ×3 IMPLANT
GAUZE SPONGE 4X4 12PLY STRL LF (GAUZE/BANDAGES/DRESSINGS) ×2 IMPLANT
GAUZE XEROFORM 5X9 LF (GAUZE/BANDAGES/DRESSINGS) ×3 IMPLANT
GLOVE SURG LTX SZ7 (GLOVE) ×3 IMPLANT
GLOVE SURG UNDER POLY LF SZ7 (GLOVE) ×60 IMPLANT
GLOVE SURG UNDER POLY LF SZ7.5 (GLOVE) ×12 IMPLANT
GOWN STRL REIN XL XLG (GOWN DISPOSABLE) ×3 IMPLANT
HANDPIECE INTERPULSE COAX TIP (DISPOSABLE) ×2
KIT BASIN OR (CUSTOM PROCEDURE TRAY) ×3 IMPLANT
NS IRRIG 1000ML POUR BTL (IV SOLUTION) ×3 IMPLANT
PACK GENERAL/GYN (CUSTOM PROCEDURE TRAY) ×3 IMPLANT
PAD ABD 8X10 STRL (GAUZE/BANDAGES/DRESSINGS) ×2 IMPLANT
PAD ARMBOARD 7.5X6 YLW CONV (MISCELLANEOUS) ×3 IMPLANT
PAD CAST 4YDX4 CTTN HI CHSV (CAST SUPPLIES) ×4 IMPLANT
PADDING CAST COTTON 4X4 STRL (CAST SUPPLIES) ×8
SET HNDPC FAN SPRY TIP SCT (DISPOSABLE) IMPLANT
SPONGE T-LAP 18X18 ~~LOC~~+RFID (SPONGE) ×4 IMPLANT
STAPLER VISISTAT 35W (STAPLE) IMPLANT
STOCKINETTE IMPERVIOUS LG (DRAPES) ×3 IMPLANT
SUT SILK 0 TIES 10X30 (SUTURE) ×3 IMPLANT
SUT SILK 2 0 SH CR/8 (SUTURE) IMPLANT
SUT VIC AB 0 CT1 27 (SUTURE) ×4
SUT VIC AB 0 CT1 27XBRD ANBCTR (SUTURE) ×2 IMPLANT
SUT VIC AB 1 CT1 27 (SUTURE) ×4
SUT VIC AB 1 CT1 27XBRD ANBCTR (SUTURE) ×2 IMPLANT
TOWEL GREEN STERILE (TOWEL DISPOSABLE) ×9 IMPLANT

## 2021-10-29 NOTE — Discharge Instructions (Signed)

## 2021-10-29 NOTE — Progress Notes (Signed)
Dr. Doroteo Glassman made aware of sodium and creatinine results on BMP. No new orders received.

## 2021-10-29 NOTE — Op Note (Addendum)
° ° °  Date of surgery: 10/29/2021  Preoperative diagnosis: Left diabetic foot infection, osteomyelitis  Postoperative diagnosis: Same  Procedure: Amputation of left extremity through the tibia and fibula with primary closure.  CPT [phone]  Drains: None  Surgeon: Marianna Payment, M.D.  Assist: Madalyn Rob, PA-C  Anesthesia: General.  Estimated blood loss: 035 cc  Complications: None  Indication for procedure: The patient presents today for the above-mentioned procedure for the above mentioned condition.  The risks, benefits, and alternatives to surgery were discussed with the patient and/or family and they wish to proceed.  Description of procedure: The patient was identified in the preoperative holding area. The patient was brought back to the operating room. The patient was placed supine on the table. General anesthesia was induced. Nonsterile tourniquet placed on the upper thigh. Timeout was performed. Preoperative antibiotics given. A fishmouth incision with a large posterior flap was used. Sharp dissection was taken down through the muscle and the fascia. The neurovascular bundles were identified and ligated. The tibia was osteotomized approximately 15 cm distal to the tibial tubercle. The fibula was osteotomized approximately 1 cm proximal to the level of the tibial osteotomy.  The anterior tibial cortex was smoothed with the saw.  The tourniquet was deflated. Hemostasis was obtained. There was no evidence of infection in the surgical bed.  All the muscles were viable and contracted with bovie stimulation.  The wound was thoroughly irrigated with normal saline.  The wounds closed in layer fashion using #1 vicryl for the fascia, 2-0 vicryl for the deep skin layer, 2-0 nylon for the skin. A sterile dressing was applied. Patient was extubated and transferred to the PACU in stable condition.  Tawanna Cooler, my PA, was a medical necessity for the entirety of the surgery  including opening, closing, limb positioning, retracting, exposing, and repairing.  Disposition:  Patient will be nonweightbearing to the operative extremity. The patient will undergo prosthetic fitting once the wound is healed and the swelling has subsided.  Azucena Cecil, MD 4:57 PM

## 2021-10-29 NOTE — Anesthesia Procedure Notes (Signed)
Procedure Name: LMA Insertion Date/Time: 10/29/2021 3:42 PM Performed by: Babs Bertin, CRNA Pre-anesthesia Checklist: Patient identified, Emergency Drugs available, Suction available and Patient being monitored Patient Re-evaluated:Patient Re-evaluated prior to induction Oxygen Delivery Method: Circle System Utilized Preoxygenation: Pre-oxygenation with 100% oxygen Induction Type: IV induction Ventilation: Mask ventilation without difficulty LMA: LMA inserted LMA Size: 5.0 Number of attempts: 1 Airway Equipment and Method: Bite block Placement Confirmation: positive ETCO2 Tube secured with: Tape Dental Injury: Teeth and Oropharynx as per pre-operative assessment

## 2021-10-29 NOTE — Anesthesia Preprocedure Evaluation (Addendum)
Anesthesia Evaluation  Patient identified by MRN, date of birth, ID band Patient awake    Reviewed: Allergy & Precautions, NPO status , Patient's Chart, lab work & pertinent test results, reviewed documented beta blocker date and time   Airway Mallampati: III  TM Distance: >3 FB Neck ROM: Full    Dental no notable dental hx. (+) Teeth Intact, Dental Advisory Given   Pulmonary neg pulmonary ROS,    Pulmonary exam normal breath sounds clear to auscultation       Cardiovascular hypertension (167/86), Pt. on medications and Pt. on home beta blockers Normal cardiovascular exam+ dysrhythmias (eliquis LD 3d ago 12/18) Atrial Fibrillation + Valvular Problems/Murmurs (mild MR) MR  Rhythm:Regular Rate:Normal  Echo 06/2021: 1. Left ventricular ejection fraction, by estimation, is 55 to 60%. The  left ventricle has normal function. The left ventricle has no regional  wall motion abnormalities. There is mild left ventricular hypertrophy.  Left ventricular diastolic parameters  were normal.  2. Right ventricular systolic function is normal. The right ventricular  size is normal. Tricuspid regurgitation signal is inadequate for assessing  PA pressure.  3. The mitral valve is normal in structure. Mild mitral valve  regurgitation. No evidence of mitral stenosis.  4. The aortic valve is grossly normal. Aortic valve regurgitation is  trivial. No aortic stenosis is present.  5. The inferior vena cava is normal in size with greater than 50%  respiratory variability, suggesting right atrial pressure of 3 mmHg.    Neuro/Psych CVA (05/2021), No Residual Symptoms negative psych ROS   GI/Hepatic Neg liver ROS, GERD  Medicated and Controlled,  Endo/Other  diabetes, Poorly Controlled, Type 2, Oral Hypoglycemic Agentsa1c 8.4  Renal/GU CRFRenal diseaseCKD 5, Cr 3.02  negative genitourinary   Musculoskeletal negative musculoskeletal ROS (+)    Abdominal   Peds  Hematology  (+) Blood dyscrasia, anemia , Hb 9.8/27.9, plt 446   Anesthesia Other Findings Chronic hydrocodone 5/325mg , tramadol 50mg    Reproductive/Obstetrics negative OB ROS                            Anesthesia Physical Anesthesia Plan  ASA: 4  Anesthesia Plan: General and Regional   Post-op Pain Management: Regional block and Tylenol PO (pre-op)   Induction: Intravenous  PONV Risk Score and Plan: 2 and Ondansetron, Dexamethasone, Midazolam and Treatment may vary due to age or medical condition  Airway Management Planned: LMA  Additional Equipment: None  Intra-op Plan:   Post-operative Plan: Extubation in OR  Informed Consent: I have reviewed the patients History and Physical, chart, labs and discussed the procedure including the risks, benefits and alternatives for the proposed anesthesia with the patient or authorized representative who has indicated his/her understanding and acceptance.     Dental advisory given  Plan Discussed with: CRNA  Anesthesia Plan Comments:        Anesthesia Quick Evaluation

## 2021-10-29 NOTE — Anesthesia Procedure Notes (Signed)
Anesthesia Regional Block: Popliteal block   Pre-Anesthetic Checklist: , timeout performed,  Correct Patient, Correct Site, Correct Laterality,  Correct Procedure, Correct Position, site marked,  Risks and benefits discussed,  Surgical consent,  Pre-op evaluation,  At surgeon's request and post-op pain management  Laterality: Left  Prep: Maximum Sterile Barrier Precautions used, chloraprep       Needles:  Injection technique: Single-shot  Needle Type: Echogenic Stimulator Needle     Needle Length: 9cm  Needle Gauge: 22     Additional Needles:   Procedures:,,,, ultrasound used (permanent image in chart),,    Narrative:  Start time: 10/29/2021 2:00 PM End time: 10/29/2021 2:05 PM Injection made incrementally with aspirations every 5 mL.  Performed by: Personally  Anesthesiologist: Pervis Hocking, DO  Additional Notes: Monitors applied. No increased pain on injection. No increased resistance to injection. Injection made in 5cc increments. Good needle visualization. Patient tolerated procedure well.

## 2021-10-29 NOTE — Transfer of Care (Signed)
Immediate Anesthesia Transfer of Care Note  Patient: Bruce Little  Procedure(s) Performed: AMPUTATION BELOW KNEE -LEFT (Left: Knee)  Patient Location: PACU  Anesthesia Type:General  Level of Consciousness: responds to stimulation  Airway & Oxygen Therapy: Patient Spontanous Breathing  Post-op Assessment: Report given to RN and Post -op Vital signs reviewed and stable  Post vital signs: Reviewed and stable  Last Vitals:  Vitals Value Taken Time  BP 108/73 10/29/21 1711  Temp    Pulse 76 10/29/21 1713  Resp 16 10/29/21 1713  SpO2 96 % 10/29/21 1713  Vitals shown include unvalidated device data.  Last Pain:  Vitals:   10/29/21 1411  TempSrc:   PainSc: 0-No pain      Patients Stated Pain Goal: 1 (25/49/82 6415)  Complications: No notable events documented.

## 2021-10-29 NOTE — Anesthesia Procedure Notes (Signed)
Anesthesia Regional Block: Adductor canal block   Pre-Anesthetic Checklist: , timeout performed,  Correct Patient, Correct Site, Correct Laterality,  Correct Procedure, Correct Position, site marked,  Risks and benefits discussed,  Surgical consent,  Pre-op evaluation,  At surgeon's request and post-op pain management  Laterality: Left  Prep: Maximum Sterile Barrier Precautions used, chloraprep       Needles:  Injection technique: Single-shot  Needle Type: Echogenic Stimulator Needle     Needle Length: 9cm  Needle Gauge: 22     Additional Needles:   Procedures:,,,, ultrasound used (permanent image in chart),,    Narrative:  Start time: 10/29/2021 2:05 PM End time: 10/29/2021 2:10 PM Injection made incrementally with aspirations every 5 mL.  Performed by: Personally  Anesthesiologist: Pervis Hocking, DO  Additional Notes: Monitors applied. No increased pain on injection. No increased resistance to injection. Injection made in 5cc increments. Good needle visualization. Patient tolerated procedure well.

## 2021-10-29 NOTE — H&P (Signed)
PREOPERATIVE H&P  Chief Complaint: left foot osteomyelitis  HPI: Bruce Little is a 50 y.o. male who presents for surgical treatment of left foot osteomyelitis.  He denies any changes in medical history.  Past Medical History:  Diagnosis Date   A-fib ([place])    Anemia    low iron   Chronic kidney disease    COVID    has had it 2 times, one mild and one wasn't   DM2 (diabetes mellitus, type 2) ([place])    History of blood transfusion    HTN (hypertension)    Osteomyelitis of fifth toe of left foot ([place]) 08/13/2021   Osteomyelitis of fourth toe of left foot ([place]) 08/13/2021   Pneumonia    Stroke ([place]) 05/19/2021   no residual effects.   Past Surgical History:  Procedure Laterality Date   AMPUTATION Left 08/29/2021   Procedure: AMPUTATION OF FOURTH TOE AND RAY ALONG WITH REMAINING FITH METATARSAL;  Surgeon: Leandrew Koyanagi, MD;  Location: [facility] [facility];  Service: Orthopedics;  Laterality: Left;   AMPUTATION Left 09/03/2021   Procedure: LISFRANC AMPUTATION;  Surgeon: Leandrew Koyanagi, MD;  Location: [facility];  Service: Orthopedics;  Laterality: Left;   APPLICATION OF WOUND VAC Left 07/07/2021   Procedure: APPLICATION OF WOUND VAC;  Surgeon: Leandrew Koyanagi, MD;  Location: [facility];  Service: Orthopedics;  Laterality: Left;   APPLICATION OF WOUND VAC Left 08/29/2021   Procedure: APPLICATION OF WOUND VAC;  Surgeon: Leandrew Koyanagi, MD;  Location: [facility];  Service: Orthopedics;  Laterality: Left;   I & D EXTREMITY Left 07/03/2021   Procedure: IRRIGATION AND DEBRIDEMENT ,FIFTH RAY  AMPUTATION LEFT FOOT, , WOUND VAC PLACEMENT;  Surgeon: Leandrew Koyanagi, MD;  Location: [facility];  Service: Orthopedics;  Laterality: Left;   I & D EXTREMITY Left 07/07/2021   Procedure: IRRIGATION AND DEBRIDEMENT LEFT FOOT;  Surgeon: Leandrew Koyanagi, MD;  Location: [place];  Service: Orthopedics;  Laterality: Left;   I & D EXTREMITY Left 08/29/2021   Procedure: IRRIGATION AND DEBRIDEMENT LEFT FOOT;  Surgeon: Leandrew Koyanagi, MD;  Location: [facility];  Service: Orthopedics;  Laterality: Left;   IR FLUORO GUIDE CV LINE RIGHT  07/09/2021   IR REMOVAL TUN CV CATH W/O FL  10/08/2021   IR US GUIDE VASC ACCESS RIGHT  07/09/2021   VITRECTOMY Left    [place] [facility]   Social History   Socioeconomic History   Marital status: Married    Spouse name: Caryl Pina   Number of children: Not on file   Years of education: Not on file   Highest education level: Not on file  Occupational History   Not on file  Tobacco Use   Smoking status: Never   Smokeless tobacco: Never  Vaping Use   Vaping Use: Never used  Substance and Sexual Activity   Alcohol use: Not Currently    Comment: rare   Drug use: Never   Sexual activity: Not on file  Other Topics Concern   Not on file  Social History Narrative   Not on file   Social Determinants of Health   Financial Resource Strain: Not on file  Food Insecurity: Not on file  Transportation Needs: Not on file  Physical Activity: Not on file  Stress: Not on file  Social Connections: Not on file   Family History  Problem Relation Age of Onset   Stroke Mother    Cancer Mother    Heart disease Father  No Known Allergies Prior to Admission medications   Medication Sig Start Date End Date Taking? Authorizing Provider  acetaminophen (TYLENOL) 325 MG tablet Take 2 tablets (650 mg total) by mouth every 4 (four) hours as needed for mild pain (or temp > 37.5 C (99.5 F)). 05/21/21  Yes Sheikh, Omair Latif, DO  amLODipine (NORVASC) 10 MG tablet Take 10 mg by mouth daily.   Yes [provider]  atorvastatin (LIPITOR) 80 MG tablet Take 1 tablet (80 mg total) by mouth every evening. 07/31/21  Yes Patwardhan, Manish J, MD  cefadroxil (DURICEF) 500 MG capsule Take 1 capsule (500 mg total) by mouth 2 (two) times daily. Patient taking differently: Take 1,000 mg by mouth 2 (two) times daily. 10/15/21  Yes Tommy Medal, Lavell Islam, MD  dorzolamide-timolol (COSOPT) 22.3-6.8 MG/ML ophthalmic solution Place 1 drop into  the left eye 2 (two) times daily. 08/24/21  Yes [provider]  ELIQUIS 5 MG TABS tablet TAKE 1 TABLET BY MOUTH TWICE A DAY 09/30/21  Yes Patwardhan, Manish J, MD  glipiZIDE (GLUCOTROL) 5 MG tablet Take 1 tablet (5 mg total) by mouth daily. 05/21/21 05/21/22 Yes Sheikh, Omair Latif, DO  HYDROcodone-acetaminophen (NORCO) 5-325 MG tablet Take 1 tablet by mouth every 6 (six) hours as needed. 09/24/21  Yes Leandrew Koyanagi, MD  hydrOXYzine (ATARAX/VISTARIL) 25 MG tablet Take 25 mg by mouth every 8 (eight) hours as needed for anxiety. 03/30/21  Yes [provider]  loratadine (CLARITIN) 10 MG tablet Take 10 mg by mouth in the morning.   Yes [provider]  metoprolol tartrate (LOPRESSOR) 100 MG tablet Take 1 tablet (100 mg total) by mouth 2 (two) times daily. 07/31/21  Yes Patwardhan, Manish J, MD  metroNIDAZOLE (FLAGYL) 500 MG tablet Take 1 tablet (500 mg total) by mouth 2 (two) times daily for 21 days. 10/15/21 11/05/21 Yes Truman Hayward, MD  multivitamin (ONE-A-DAY MEN'S) TABS tablet Take 1 tablet by mouth daily.   Yes [provider]  mupirocin ointment (BACTROBAN) 2 % Apply 1 application topically 2 (two) times daily as needed (wound care). 08/27/21  Yes [provider]  ondansetron (ZOFRAN) 4 MG tablet Take 1 tablet (4 mg total) by mouth every 8 (eight) hours as needed for nausea or vomiting. 10/08/21  Yes Aundra Dubin, PA-C  pantoprazole (PROTONIX) 40 MG tablet Take 1 tablet (40 mg total) by mouth daily. 05/22/21  Yes Sheikh, Omair Latif, DO  traMADol (ULTRAM) 50 MG tablet Take 1 tablet (50 mg total) by mouth daily as needed for moderate pain. 09/02/21  Yes Aundra Dubin, PA-C  traMADol (ULTRAM) 50 MG tablet Take 1-2 tablets (50-100 mg total) by mouth daily as needed. 09/03/21  Yes Leandrew Koyanagi, MD  VICTOZA 18 MG/3ML SOPN Inject 1.8 mg into the skin in the morning. 05/07/21  Yes [provider]  oxyCODONE (OXY IR/ROXICODONE) 5 MG immediate  release tablet Take 1 tablet (5 mg total) by mouth every 6 (six) hours as needed for severe pain. 07/11/21   Thurnell Lose, MD  sodium bicarbonate 650 MG tablet Take 1 tablet (650 mg total) by mouth 2 (two) times daily. Patient not taking: Reported on 10/24/2021 07/11/21   Thurnell Lose, MD     Positive ROS: All other systems have been reviewed and were otherwise negative with the exception of those mentioned in the HPI and as above.  Physical Exam: General: Alert, no acute distress Cardiovascular: No pedal edema Respiratory: No cyanosis,  no use of accessory musculature GI: abdomen soft Skin: No lesions in the area of chief complaint Neurologic: Sensation intact distally Psychiatric: Patient is competent for consent with normal mood and affect Lymphatic: no lymphedema  MUSCULOSKELETAL: exam stable  Assessment: left foot osteomyelitis  Plan: Plan for Procedure(s): AMPUTATION BELOW KNEE -LEFT  The risks benefits and alternatives were discussed with the patient including but not limited to the risks of nonoperative treatment, versus surgical intervention including infection, bleeding, nerve injury,  blood clots, cardiopulmonary complications, morbidity, mortality, among others, and they were willing to proceed.   Preoperative templating of the joint replacement has been completed, documented, and submitted to the Operating Room personnel in order to optimize intra-operative equipment management.   Eduard Roux, MD 10/29/2021 3:30 PM

## 2021-10-30 ENCOUNTER — Encounter (HOSPITAL_COMMUNITY): Payer: Self-pay | Admitting: Orthopaedic Surgery

## 2021-10-30 ENCOUNTER — Ambulatory Visit: Payer: Self-pay | Admitting: Cardiology

## 2021-10-30 ENCOUNTER — Other Ambulatory Visit: Payer: Self-pay | Admitting: Physician Assistant

## 2021-10-30 LAB — BASIC METABOLIC PANEL
Anion gap: 7 (ref 5–15)
BUN: 53 mg/dL — ABNORMAL HIGH (ref 6–20)
CO2: 18 mmol/L — ABNORMAL LOW (ref 22–32)
Calcium: 7.6 mg/dL — ABNORMAL LOW (ref 8.9–10.3)
Chloride: 105 mmol/L (ref 98–111)
Creatinine, Ser: 2.79 mg/dL — ABNORMAL HIGH (ref 0.61–1.24)
GFR, Estimated: 27 mL/min — ABNORMAL LOW (ref >60)
Glucose, Bld: 135 mg/dL — ABNORMAL HIGH (ref 70–99)
Potassium: 4.8 mmol/L (ref 3.5–5.1)
Sodium: 130 mmol/L — ABNORMAL LOW (ref 135–145)

## 2021-10-30 LAB — CBC
HCT: 20.3 % — ABNORMAL LOW (ref 39.0–52.0)
HCT: 27.9 % — ABNORMAL LOW (ref 39.0–52.0)
Hemoglobin: 6.4 g/dL — CL (ref 13.0–17.0)
Hemoglobin: 9.1 g/dL — ABNORMAL LOW (ref 13.0–17.0)
MCH: 26.7 pg (ref 26.0–34.0)
MCH: 27.6 pg (ref 26.0–34.0)
MCHC: 31.5 g/dL (ref 30.0–36.0)
MCHC: 32.6 g/dL (ref 30.0–36.0)
MCV: 84.6 fL (ref 80.0–100.0)
MCV: 84.5 fL (ref 80.0–100.0)
Platelets: 328 10*3/uL (ref 150–400)
Platelets: 452 10*3/uL — ABNORMAL HIGH (ref 150–400)
RBC: 2.4 MIL/uL — ABNORMAL LOW (ref 4.22–5.81)
RBC: 3.3 MIL/uL — ABNORMAL LOW (ref 4.22–5.81)
RDW: 14.5 % (ref 11.5–15.5)
RDW: 14.5 % (ref 11.5–15.5)
WBC: 10.1 10*3/uL (ref 4.0–10.5)
WBC: 17 10*3/uL — ABNORMAL HIGH (ref 4.0–10.5)
nRBC: 0 % (ref 0.0–0.2)
nRBC: 0 % (ref 0.0–0.2)

## 2021-10-30 LAB — PREPARE RBC (CROSSMATCH)

## 2021-10-30 MED ORDER — OXYCODONE-ACETAMINOPHEN 5-325 MG PO TABS: 1.0000 | ORAL_TABLET | Freq: Four times a day (QID) | ORAL | 0 refills | Status: DC | PRN

## 2021-10-30 MED ORDER — HYDROMORPHONE HCL 2 MG PO TABS
2.0000 mg | ORAL_TABLET | ORAL | Status: DC | PRN
  Administered 2021-10-31: 02:00:00 2 mg via ORAL
  Filled 2021-10-30 (×2): qty 1

## 2021-10-30 MED ORDER — TRAMADOL HCL 50 MG PO TABS
50.0000 mg | ORAL_TABLET | Freq: Two times a day (BID) | ORAL | 2 refills | Status: DC | PRN
Start: 2021-10-30 — End: 2021-12-11

## 2021-10-30 MED ORDER — GABAPENTIN 600 MG PO TABS
300.0000 mg | ORAL_TABLET | Freq: Two times a day (BID) | ORAL | Status: DC
  Administered 2021-10-30 – 2021-10-31 (×2): 300 mg via ORAL
  Filled 2021-10-30 (×2): qty 1

## 2021-10-30 MED ORDER — SODIUM CHLORIDE 0.9% IV SOLUTION: Freq: Once | INTRAVENOUS | Status: DC

## 2021-10-30 MED ORDER — FUROSEMIDE 10 MG/ML IJ SOLN
20.0000 mg | Freq: Once | INTRAMUSCULAR | Status: AC
  Administered 2021-10-30: 12:00:00 20 mg via INTRAVENOUS
  Filled 2021-10-30: qty 2

## 2021-10-30 MED ORDER — OXYCODONE HCL ER 10 MG PO T12A
10.0000 mg | EXTENDED_RELEASE_TABLET | Freq: Two times a day (BID) | ORAL | Status: DC
  Administered 2021-10-30 – 2021-10-31 (×2): 10 mg via ORAL
  Filled 2021-10-30 (×3): qty 1

## 2021-10-30 MED ORDER — SODIUM CHLORIDE 0.9% IV SOLUTION: Freq: Once | INTRAVENOUS | Status: AC

## 2021-10-30 NOTE — Telephone Encounter (Signed)
Sent in

## 2021-10-30 NOTE — Care Management (Addendum)
Patient received walker and 3 in 1 from [facility] in October 2022.   Spoke to patient and wife at bedside. Discussed home health. He has had [facility] in past and would like them again. Levada Dy with [facility] accepted referral. Will need MD to sign orders. Messaged MD and PA

## 2021-10-30 NOTE — Discharge Summary (Addendum)
Patient ID: Bruce Little MRN: [id number] DOB/AGE: Jan 10, 1971 50 y.o.  Admit date: 10/29/2021 Discharge date: 10/31/2021  Admission Diagnoses:  Principal Problem:   Diabetic infection of left foot [facility])   Discharge Diagnoses:  Same  Past Medical History:  Diagnosis Date   A-fib ([place])    Anemia    low iron   Chronic kidney disease    COVID    has had it 2 times, one mild and one wasn't   DM2 (diabetes mellitus, type 2) ([place])    History of blood transfusion    HTN (hypertension)    Osteomyelitis of fifth toe of left foot ([place]) 08/13/2021   Osteomyelitis of fourth toe of left foot ([place]) 08/13/2021   Pneumonia    Stroke ([place]) 05/19/2021   no residual effects.    Surgeries: Procedure(s): AMPUTATION BELOW KNEE -LEFT on 10/29/2021   Consultants:   Discharged Condition: Improved  Hospital Course: Bruce Little is an 50 y.o. male who was admitted 10/29/2021 for operative treatment ofDiabetic infection of left foot ([place]). Patient has severe unremitting pain that affects sleep, daily activities, and work/hobbies. After pre-op clearance the patient was taken to the operating room on 10/29/2021 and underwent  Procedure(s): AMPUTATION BELOW KNEE -LEFT.    Patient was given perioperative antibiotics:  Anti-infectives (From admission, onward)    Start     Dose/Rate Route Frequency Ordered Stop   10/30/21 0600  ceFAZolin (ANCEF) IVPB 2g/100 mL premix        2 g 200 mL/hr over 30 Minutes Intravenous On call to O.R. 10/29/21 1225 10/29/21 1615   10/29/21 2200  ceFAZolin (ANCEF) IVPB 2g/100 mL premix        2 g 200 mL/hr over 30 Minutes Intravenous Every 6 hours 10/29/21 1823 10/30/21 1533   10/29/21 1230  ceFAZolin (ANCEF) 2-4 GM/100ML-% IVPB       Note to Pharmacy: Wendall Mola M: cabinet override      10/29/21 1230 10/29/21 1547        Patient was given sequential compression devices, early ambulation, and chemoprophylaxis to prevent DVT.  Patient benefited maximally from  hospital stay and there were no complications.    Recent vital signs: Patient Vitals for the past 24 hrs:  BP Temp Temp src Pulse Resp SpO2  10/31/21 0815 (!) 153/94 99.3 F (37.4 C) Oral 94 17 98 %  10/31/21 0500 (!) 153/85 99 F (37.2 C) Oral 90 18 98 %  10/30/21 2229 (!) 157/91 98.6 F (37 C) Oral 91 17 95 %  10/30/21 1643 (!) 152/82 98.3 F (36.8 C) Oral 90 17 97 %  10/30/21 1445 (!) 143/77 98.4 F (36.9 C) Oral 91 16 98 %  10/30/21 1247 (!) 143/79 98.6 F (37 C) -- 84 16 97 %  10/30/21 1232 (!) 144/79 98.2 F (36.8 C) Oral 84 16 97 %  10/30/21 1137 137/80 97.7 F (36.5 C) Oral 85 16 100 %     Recent laboratory studies:  Recent Labs    10/29/21 1250 10/30/21 0457 10/30/21 1615  WBC 16.1* 10.1 17.0*  HGB 8.7* 6.4* 9.1*  HCT 27.9* 20.3* 27.9*  PLT 446* 328 452*  NA 130* 130*  --   K 4.6 4.8  --   CL 101 105  --   CO2 18* 18*  --   BUN 55* 53*  --   CREATININE 3.02* 2.79*  --   GLUCOSE 126* 135*  --   CALCIUM 8.9 7.6*  --  Discharge Medications:   Allergies as of 10/31/2021   No Known Allergies      Medication List     STOP taking these medications    acetaminophen 325 MG tablet Commonly known as: TYLENOL   HYDROcodone-acetaminophen 5-325 MG tablet Commonly known as: Norco   oxyCODONE 5 MG immediate release tablet Commonly known as: Oxy IR/ROXICODONE   sodium bicarbonate 650 MG tablet       TAKE these medications    amLODipine 10 MG tablet Commonly known as: NORVASC Take 10 mg by mouth daily.   atorvastatin 80 MG tablet Commonly known as: LIPITOR Take 1 tablet (80 mg total) by mouth every evening.   cefadroxil 500 MG capsule Commonly known as: DURICEF Take 1 capsule (500 mg total) by mouth 2 (two) times daily. What changed: how much to take   dorzolamide-timolol 22.3-6.8 MG/ML ophthalmic solution Commonly known as: COSOPT Place 1 drop into the left eye 2 (two) times daily.   Eliquis 5 MG Tabs tablet Generic drug:  apixaban TAKE 1 TABLET BY MOUTH TWICE A DAY   glipiZIDE 5 MG tablet Commonly known as: Glucotrol Take 1 tablet (5 mg total) by mouth daily.   hydrOXYzine 25 MG tablet Commonly known as: ATARAX Take 25 mg by mouth every 8 (eight) hours as needed for anxiety.   loratadine 10 MG tablet Commonly known as: CLARITIN Take 10 mg by mouth in the morning.   metoprolol tartrate 100 MG tablet Commonly known as: LOPRESSOR Take 1 tablet (100 mg total) by mouth 2 (two) times daily.   metroNIDAZOLE 500 MG tablet Commonly known as: Flagyl Take 1 tablet (500 mg total) by mouth 2 (two) times daily for 21 days.   multivitamin Tabs tablet Take 1 tablet by mouth daily.   mupirocin ointment 2 % Commonly known as: BACTROBAN Apply 1 application topically 2 (two) times daily as needed (wound care).   ondansetron 4 MG tablet Commonly known as: Zofran Take 1 tablet (4 mg total) by mouth every 8 (eight) hours as needed for nausea or vomiting.   oxyCODONE-acetaminophen 5-325 MG tablet Commonly known as: Percocet Take 1-2 tablets by mouth every 6 (six) hours as needed for severe pain.   pantoprazole 40 MG tablet Commonly known as: PROTONIX Take 1 tablet (40 mg total) by mouth daily.   traMADol 50 MG tablet Commonly known as: ULTRAM Take 1-2 tablets (50-100 mg total) by mouth every 12 (twelve) hours as needed. What changed:  when to take this Another medication with the same name was removed. Continue taking this medication, and follow the directions you see here.   Victoza 18 MG/3ML Sopn Generic drug: liraglutide Inject 1.8 mg into the skin in the morning.               Durable Medical Equipment  (From admission, onward)           Start     Ordered   10/29/21 1824  DME Walker rolling  Once       Question:  Patient needs a walker to treat with the following condition  Answer:  History of open reduction and internal fixation (ORIF) procedure   10/29/21 1823   10/29/21 1824  DME  3 n 1  Once        10/29/21 1823   10/29/21 1824  DME Bedside commode  Once       Question:  Patient needs a bedside commode to treat with the following condition  Answer:  History of  open reduction and internal fixation (ORIF) procedure   10/29/21 1823            Diagnostic Studies: MR Foot Left w/o contrast  Result Date: 10/21/2021 CLINICAL DATA:  Left foot infection.  Fever. EXAM: MRI OF THE LEFT FOOT WITHOUT CONTRAST TECHNIQUE: Multiplanar, multisequence MR imaging of the left foot was performed. No intravenous contrast was administered. COMPARISON:  MRI left foot dated August 18, 2021. FINDINGS: Bones/Joint/Cartilage Interval Lisfranc amputation. New severe confluent marrow edema involving the cuneiforms, cuboid, navicular, and calcaneus, with corresponding decreased T1 marrow signal and scattered bony erosions. Less intense and more patchy marrow edema involving the talus and distal tibia/fibula. No fracture or dislocation. Small osteochondral lesion of the lateral talar dome. Small tibiotalar joint effusion. Ligaments Chronically torn anterior talofibular ligament. Remaining lateral and medial ankle ligaments are intact. Muscles and Tendons Post amputation changes. Moderate amount of fluid surrounding the peroneal tendons. Soft tissue 5.2 x 3.2 x 1.7 cm fluid collection in the deep midfoot plantar to the cuneiforms (series 7, image 28). Smaller 1.7 x 0.5 x 1.3 cm fluid collection lateral to the lateral cuneiform (series 7, image 28). No soft tissue mass. IMPRESSION: 1. Interval Lisfranc amputation. New extensive osteomyelitis involving the cuneiforms, navicular, cuboid, and calcaneus. 2. 5.2 cm abscess in the deep midfoot plantar to the cuneiforms. Smaller 1.7 cm abscess lateral to the lateral cuneiform. 3. Moderate peroneal tenosynovitis, possibly infectious. These results will be called to the ordering clinician or representative by the Radiologist Assistant, and communication documented in  the PACS or [employer]. Electronically Signed   By: Titus Dubin M.D.   On: 10/21/2021 13:53   IR Removal Tun Cv Cath W/O FL  Result Date: 10/08/2021 INDICATION: Patient with history of osteomyelitis status post tunneled central venous catheter placement for IV antibiotic therapy. Request to IR for removal of tunneled central venous catheter. Tunneled central line was placed by Dr. Serafina Royals on 07/09/2021 and functioned well throughout duration of usage. EXAM: REMOVAL OF TUNNELED CENTRAL VENOUS CATHETER MEDICATIONS: None COMPLICATIONS: None immediate. PROCEDURE: Informed written consent was obtained from the patient following an explanation of the procedure, risks, benefits and alternatives to treatment. A time out was performed prior to the initiation of the procedure. Maximal barrier sterile technique was utilized including caps, mask, sterile gowns, sterile gloves, large sterile drape, hand hygiene, and ChloraPrep. Utilizing gentle traction, the catheter was removed intact. Hemostasis was obtained with manual compression. A dressing was placed. The patient tolerated the procedure well without immediate post procedural complication. IMPRESSION: Successful removal of tunneled central venous catheter. Read by Candiss Norse, PA-C Electronically Signed   By: Sandi Mariscal M.D.   On: 10/08/2021 15:06    Disposition: Discharge disposition: 01-Home or Self Care       Discharge Instructions     Call MD / Call 911   Complete by: As directed    If you experience chest pain or shortness of breath, CALL 911 and be transported to the hospital emergency room.  If you develope a fever above 101.5 F, pus (white drainage) or increased drainage or redness at the wound, or calf pain, call your surgeon's office.   Constipation Prevention   Complete by: As directed    Drink plenty of fluids.  Prune juice may be helpful.  You may use a stool softener, such as Colace (over the counter) 100 mg twice a day.   Use MiraLax (over the counter) for constipation as needed.   Driving restrictions  Complete by: As directed    No driving while taking narcotic pain meds.   Increase activity slowly as tolerated   Complete by: As directed    Post-operative opioid taper instructions:   Complete by: As directed    POST-OPERATIVE OPIOID TAPER INSTRUCTIONS: It is important to wean off of your opioid medication as soon as possible. If you do not need pain medication after your surgery it is ok to stop day one. Opioids include: Codeine, Hydrocodone(Norco, Vicodin), Oxycodone(Percocet, oxycontin) and hydromorphone amongst others.  Long term and even short term use of opiods can cause: Increased pain response Dependence Constipation Depression Respiratory depression And more.  Withdrawal symptoms can include Flu like symptoms Nausea, vomiting And more Techniques to manage these symptoms Hydrate well Eat regular healthy meals Stay active Use relaxation techniques(deep breathing, meditating, yoga) Do Not substitute Alcohol to help with tapering If you have been on opioids for less than two weeks and do not have pain than it is ok to stop all together.  Plan to wean off of opioids This plan should start within one week post op of your joint replacement. Maintain the same interval or time between taking each dose and first decrease the dose.  Cut the total daily intake of opioids by one tablet each day Next start to increase the time between doses. The last dose that should be eliminated is the evening dose.           Follow-up Information     Leandrew Koyanagi, MD. Schedule an appointment as soon as possible for a visit in 2 week(s).   Specialty: Orthopedic Surgery Contact information: [place] [address] [place], [place] Follow up.   Specialty: Home Health Services Contact information: [address] [place]  [phone]                  Signed: Eduard Roux 10/31/2021, 11:30 AM

## 2021-10-30 NOTE — Progress Notes (Signed)
PT Cancellation Note  Patient Details Name: Bruce Little MRN: [id number] DOB: 09/11/1971   Cancelled Treatment:    Reason Eval/Treat Not Completed: Medical issues which prohibited therapy.  Transfusing, will retry later.   Ramond Dial 10/30/2021, 2:12 PM  Mee Hives, PT PhD Acute Rehab Dept. Number: [place] [facility] [place]

## 2021-10-30 NOTE — Progress Notes (Signed)
Physical Therapy Evaluation Patient Details Name: Bruce Little MRN: [id number] DOB: 04-Mar-1971 Today's Date: 10/30/2021  History of Present Illness  50 yo male presenting for surgical treatment of L foot osteomyelitis. 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  Clinical Impression  Pt was seen for progressing his mobility on RW to get up a step mult times for home.  He has good assistance with wife, has equipment there and is motivated to be up and walking with therapy services with his discharge.  Follow up with him to avoid disuse stiffness and weakness, and encourage him to be up in chair and trying to transfer as much as possible with all staff.  Follow for acute PT goals, with pt noted to have needs for standing balance and control of gait in all types of situations.  Pt intends to be fitted for a prosthesis and will recommend him to HHPT for strengthening, balance and endurance standing to enhance independence with gait.     Recommendations for follow up therapy are one component of a multi-disciplinary discharge planning process, led by the attending physician.  Recommendations may be updated based on patient status, additional functional criteria and insurance authorization.  Follow Up Recommendations Home health PT    Assistance Recommended at Discharge Intermittent Supervision/Assistance  Functional Status Assessment Patient has had a recent decline in their functional status and demonstrates the ability to make significant improvements in function in a reasonable and predictable amount of time.  Equipment Recommendations  None recommended by PT    Recommendations for Other Services       Precautions / Restrictions Precautions Precautions: Fall Precaution Comments: provided belt for gait esp to climb steps Restrictions Weight Bearing Restrictions: Yes LLE  Weight Bearing: Non weight bearing Other Position/Activity Restrictions: L BKA      Mobility  Bed Mobility Overal bed mobility: Modified Independent             General bed mobility comments: extra time to get to side of bed    Transfers Overall transfer level: Needs assistance Equipment used: 1 person hand held assist;Rolling walker (2 wheels) Transfers: Sit to/from Stand Sit to Stand: Min assist          Lateral/Scoot Transfers: Supervision General transfer comment: reminders for hand placement every trial    Ambulation/Gait Ambulation/Gait assistance: Min assist Gait Distance (Feet): 3 Feet Assistive device: Rolling walker (2 wheels);1 person hand held assist   Gait velocity: reduced     General Gait Details: stepping on side of bed with pt voicing fatigue on BUE's  Stairs Stairs: Yes Stairs assistance: Min assist Stair Management: Forwards;With walker;Step to pattern Number of Stairs: 6 General stair comments: single step with pt stepping up and then down to practice for home  Wheelchair Mobility    Modified Rankin (Stroke Patients Only)       Balance Overall balance assessment: Needs assistance Sitting-balance support: Single extremity supported Sitting balance-Leahy Scale: Good Sitting balance - Comments: no UE use needed     Standing balance-Leahy Scale: Poor                               Pertinent Vitals/Pain Pain Assessment: Faces Faces Pain Scale: Hurts a little bit Pain Location: LLE Pain Descriptors / Indicators: Guarding Pain Intervention(s): Monitored during session;Repositioned    Home Living  Family/patient expects to be discharged to:: Private residence Living Arrangements: Spouse/significant other;Children Available Help at Discharge: Family;Available 24 hours/day Type of Home: House Home Access: Stairs to enter Entrance Stairs-Rails: None Entrance Stairs-Number of Steps: 1 + 1   Home Layout: Two level;Able  to live on main level with bedroom/bathroom Home Equipment: Rolling Walker (2 wheels);Wheelchair - manual;Shower seat;BSC/3in1;Cane - single point Additional Comments: patient is able to stay on first floor as needed with 1/2 bath    Prior Function Prior Level of Function : Independent/Modified Independent             Mobility Comments: Using RW, knee scooter, and I-walker.       Hand Dominance   Dominant Hand: Right    Extremity/Trunk Assessment   Upper Extremity Assessment Upper Extremity Assessment: Defer to OT evaluation    Lower Extremity Assessment Lower Extremity Assessment: LLE deficits/detail LLE Deficits / Details: s/p L BKA LLE Coordination: decreased gross motor    Cervical / Trunk Assessment Cervical / Trunk Assessment: Normal  Communication   Communication: No difficulties  Cognition Arousal/Alertness: Awake/alert Behavior During Therapy: WFL for tasks assessed/performed Overall Cognitive Status: Within Functional Limits for tasks assessed                                          General Comments General comments (skin integrity, edema, etc.): Pt is able to help stand and get up a step on RW, no concerns about his safety as he has a gait belt on and his wife is able to observe him getting up the step    Exercises     Assessment/Plan    PT Assessment Patient needs continued PT services  PT Problem List Decreased strength;Decreased balance;Decreased activity tolerance;Decreased skin integrity;Pain       PT Treatment Interventions DME instruction;Gait training;Stair training;Functional mobility training;Therapeutic activities;Therapeutic exercise;Balance training;Neuromuscular re-education;Patient/family education    PT Goals (Current goals can be found in the Care Plan section)  Acute Rehab PT Goals Patient Stated Goal: to recover independence and get back to his routine PT Goal Formulation: With patient/family Time For Goal  Achievement: 11/12/21 Potential to Achieve Goals: Good    Frequency Min 3X/week   Barriers to discharge Inaccessible home environment;Decreased caregiver support stairs to enter and wife is there as sole caregiver    Co-evaluation               AM-PAC PT "6 Clicks" Mobility  Outcome Measure Help needed turning from your back to your side while in a flat bed without using bedrails?: None Help needed moving from lying on your back to sitting on the side of a flat bed without using bedrails?: None Help needed moving to and from a bed to a chair (including a wheelchair)?: A Little Help needed standing up from a chair using your arms (e.g., wheelchair or bedside chair)?: A Little Help needed to walk in hospital room?: A Little Help needed climbing 3-5 steps with a railing? : A Lot 6 Click Score: 19    End of Session Equipment Utilized During Treatment: Gait belt Activity Tolerance: Patient tolerated treatment well;Patient limited by fatigue Patient left: in bed;with call bell/phone within reach Nurse Communication: Mobility status PT Visit Diagnosis: Unsteadiness on feet (R26.81);Muscle weakness (generalized) (M62.81)    Time: 9211-9417 PT Time Calculation (min) (ACUTE ONLY): 25 min   Charges:   PT Evaluation $PT Eval  Moderate Complexity: 1 Mod PT Treatments $Gait Training: 8-22 mins       Ramond Dial 10/30/2021, 5:00 PM  Mee Hives, PT PhD Acute Rehab Dept. Number: [place]

## 2021-10-30 NOTE — Progress Notes (Signed)
MD on call notified about patient's low Hgb of 6.4. No new orders at this time and patient asymptomatic.

## 2021-10-30 NOTE — Social Work (Signed)
°  Transition of Care [facility]) Screening Note   Patient Details  Name: Bruce Little Date of Birth: [birth year]   Transition of Care [facility]) CM/SW Contact:    Emeterio Reeve, LCSW Phone Number: 10/30/2021, 8:56 AM    Transition of Care Department [facility]) has reviewed patient and no TOC needs have been identified at this time. We will continue to monitor patient advancement through interdisciplinary progression rounds. If new patient transition needs arise, please place a TOC consult.

## 2021-10-30 NOTE — Anesthesia Postprocedure Evaluation (Signed)
Anesthesia Post Note  Patient: Bruce Little  Procedure(s) Performed: AMPUTATION BELOW KNEE -LEFT (Left: Knee)     Patient location during evaluation: PACU Anesthesia Type: General Level of consciousness: awake and alert Pain management: pain level controlled Vital Signs Assessment: post-procedure vital signs reviewed and stable Respiratory status: spontaneous breathing, nonlabored ventilation, respiratory function stable and patient connected to nasal cannula oxygen Cardiovascular status: blood pressure returned to baseline and stable Postop Assessment: no apparent nausea or vomiting Anesthetic complications: no   No notable events documented.  Last Vitals:  Vitals:   10/30/21 0343 10/30/21 0524  BP: 135/87 128/82  Pulse: 80 79  Resp: 20 20  Temp: 36.8 C 37 C  SpO2: 98% 98%    Last Pain:  Vitals:   10/30/21 0524  TempSrc: Oral  PainSc:                  Bruce Little

## 2021-10-30 NOTE — Progress Notes (Signed)
° ° ° ° °  INFECTIOUS DISEASE ATTENDING ADDENDUM:   Date: 10/30/2021  Patient name: Bruce Little  Medical record number: [id number]  Date of birth: 09-17-1971   Patient's wife reached out to me via MyChart to let us know that the patient is undergone below the knee amputation.  She was curious about needing further labs.  He does not need further labs BKA should have cured his osteomyelitis.  He should not need further antibiotics and will not need follow-up with [place] either unless he so desires.  I will DC his follow-up appointment but we are always happy to help him out if needed.     Alcide Evener 10/30/2021, 12:19 PM

## 2021-10-30 NOTE — Progress Notes (Addendum)
Subjective: 1 Day Post-Op Procedure(s) (LRB): AMPUTATION BELOW KNEE -LEFT (Left) Patient reports pain as mild.  Feeling well this am.  No complaints of chest pain/pressure/palpitations, sob or fatigue  Objective: Vital signs in last 24 hours: Temp:  [97.5 F (36.4 C)-98.6 F (37 C)] 98.6 F (37 C) (12/22 0524) Pulse Rate:  [76-99] 79 (12/22 0524) Resp:  [9-22] 20 (12/22 0524) BP: (108-167)/(73-90) 128/82 (12/22 0524) SpO2:  [96 %-100 %] 98 % (12/22 0524) Weight:  [86.1 kg] 86.1 kg (12/21 1243)  Intake/Output from previous day: 12/21 0701 - 12/22 0700 In: 2503 [P.O.:240; I.V.:2063; IV Piggyback:200] Out: 1130 [Urine:980; Blood:150] Intake/Output this shift: No intake/output data recorded.  Recent Labs    10/27/21 1358 10/29/21 1250 10/30/21 0457  HGB 7.8* 8.7* 6.4*   Recent Labs    10/29/21 1250 10/30/21 0457  WBC 16.1* 10.1  RBC 3.20* 2.40*  HCT 27.9* 20.3*  PLT 446* 328   Recent Labs    10/29/21 1250 10/30/21 0457  NA 130* 130*  K 4.6 4.8  CL 101 105  CO2 18* 18*  BUN 55* 53*  CREATININE 3.02* 2.79*  GLUCOSE 126* 135*  CALCIUM 8.9 7.6*   No results for input(s): LABPT, INR in the last 72 hours.  Incision: dressing C/D/I No cellulitis present Compartment soft   Assessment/Plan: 1 Day Post-Op Procedure(s) (LRB): AMPUTATION BELOW KNEE -LEFT (Left) Advance diet Up with therapy NWB LLE Continue to elevate.  Do not use pillow under knee to avoid flexion contracture ABLA on chronic anemia- currently asymptomatic, but will order 2 units prbc due to low hemoglobin of 6.4 D/c home once he has mobilized well with PT and has finished receiving blood.  Patient motivated to go home today      Aundra Dubin 10/30/2021, 8:01 AM

## 2021-10-30 NOTE — Evaluation (Addendum)
Occupational Therapy Evaluation Patient Details Name: Bruce Little MRN: [id number] DOB: Nov 04, 1971 Today's Date: 10/30/2021   History of Present Illness 50 yo male presenting for surgical treatment of L foot osteomyelitis. 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   Clinical Impression   PTA, pt was living with his wife and children and was performing ADLs and using RW, knee scooter, and I-walker for mobility. Providing education on WB precautions and not resting with pillow under L knee. Pt currently performing at [place] level for ADLs and functional mobility. Pt requiring increased cues for safety and following recommendations. Providing all education and answering questions in preparation for possible dc later today. Pt would benefit from further acute OT to facilitate safe dc. Recommend dc to home with HHOT for further OT to optimize safety, independence with ADLs, and return to PLOF.       Recommendations for follow up therapy are one component of a multi-disciplinary discharge planning process, led by the attending physician.  Recommendations may be updated based on patient status, additional functional criteria and insurance authorization.   Follow Up Recommendations  Home health OT    Assistance Recommended at Discharge Frequent or constant Supervision/Assistance  Functional Status Assessment  Patient has had a recent decline in their functional status and demonstrates the ability to make significant improvements in function in a reasonable and predictable amount of time.  Equipment Recommendations  None recommended by OT    Recommendations for Other Services PT consult     Precautions / Restrictions Precautions Precautions: Fall Precaution Comments: Providing education on no pillow under knee Restrictions Weight Bearing Restrictions:  Yes LLE Weight Bearing: Non weight bearing Other Position/Activity Restrictions: L BKA      Mobility Bed Mobility Overal bed mobility: Modified Independent                  Transfers Overall transfer level: Needs assistance Equipment used: None;Rolling walker (2 wheels) Transfers: Sit to/from Stand;Bed to chair/wheelchair/BSC Sit to Stand: Min guard          Lateral/Scoot Transfers: Supervision General transfer comment: Supervision for lateral scoot to drop arm recliner. Min Guard A for safety with sit<>stand.      Balance Overall balance assessment: Needs assistance Sitting-balance support: No upper extremity supported;Feet supported Sitting balance-Leahy Scale: Good     Standing balance support: Bilateral upper extremity supported;During functional activity Standing balance-Leahy Scale: Poor                             ADL either performed or assessed with clinical judgement   ADL Overall ADL's : Needs assistance/impaired Eating/Feeding: Set up;Sitting   Grooming: Set up;Sitting   Upper Body Bathing: Set up;Sitting   Lower Body Bathing: Min guard;Sit to/from stand   Upper Body Dressing : Set up;Sitting   Lower Body Dressing: Supervision/safety;Bed level   Toilet Transfer: Transfer board;Supervision/safety Toilet Transfer Details (indicate cue type and reason): lateral scoot to drop arm recliner with Supervision       Tub/Shower Transfer Details (indicate cue type and reason): Showing pt transfer bench for safety; pt doesnt want one. Recommend spong bath for now Functional mobility during ADLs: Min guard;Rolling walker (2 wheels) (hopping ~10 ft with RW) General ADL Comments: Pt performing ADLs and functional mobility at Supervision-Mi nGuard A level. Providing education on LB ADLs  and functional transfers. Recommending pt sponge bath for now as he has a tub. S     Vision         Perception     Praxis      Pertinent Vitals/Pain  Pain Assessment: Faces Faces Pain Scale: Hurts a little bit Pain Location: LLE Pain Descriptors / Indicators: Discomfort;Grimacing Pain Intervention(s): Monitored during session;Limited activity within patient's tolerance;Repositioned     Hand Dominance Right   Extremity/Trunk Assessment Upper Extremity Assessment Upper Extremity Assessment: Generalized weakness   Lower Extremity Assessment Lower Extremity Assessment: LLE deficits/detail LLE Deficits / Details: s/p L BKA   Cervical / Trunk Assessment Cervical / Trunk Assessment: Normal   Communication Communication Communication: No difficulties   Cognition Arousal/Alertness: Awake/alert Behavior During Therapy: WFL for tasks assessed/performed Overall Cognitive Status: Within Functional Limits for tasks assessed                                 General Comments: Feel pt is at baseline. Decreased safety awareness - requiring increased cues for following therapist recommendations.     General Comments       Exercises     Shoulder Instructions      Home Living Family/patient expects to be discharged to:: Private residence Living Arrangements: Spouse/significant other;Children Available Help at Discharge: Family;Available 24 hours/day Type of Home: House Home Access: Stairs to enter [employer] of Steps: 1 + 1 Entrance Stairs-Rails: None Home Layout: Two level;Able to live on main level with bedroom/bathroom     Bathroom Shower/Tub: Teacher, early years/pre: Standard     Home Equipment: Conservation officer, nature (2 wheels);Wheelchair - manual;Shower seat;BSC/3in1;Cane - single point          Prior Functioning/Environment Prior Level of Function : Independent/Modified Independent             Mobility Comments: Using RW, knee scooter, and I-walker. ADLs Comments: Performing ADLs since last sx. 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        OT Problem  List: Decreased strength;Decreased range of motion;Decreased activity tolerance;Impaired balance (sitting and/or standing);Decreased safety awareness;Decreased knowledge of use of DME or AE;Decreased knowledge of precautions;Pain      OT Treatment/Interventions: Self-care/ADL training;Therapeutic exercise;Energy conservation;DME and/or AE instruction;Therapeutic activities;Patient/family education    OT Goals(Current goals can be found in the care plan section) Acute Rehab OT Goals Patient Stated Goal: Go home today OT Goal Formulation: With patient Time For Goal Achievement: 11/11/21 Potential to Achieve Goals: Good  OT Frequency: Min 2X/week   Barriers to D/C:            Co-evaluation              AM-PAC OT "6 Clicks" Daily Activity     Outcome Measure Help from another person eating meals?: None Help from another person taking care of personal grooming?: A Little Help from another person toileting, which includes using toliet, bedpan, or urinal?: A Little Help from another person bathing (including washing, rinsing, drying)?: A Little Help from another person to put on and taking off regular upper body clothing?: None Help from another person to put on and taking off regular lower body clothing?: A Little 6 Click Score: 20   End of Session Equipment Utilized During Treatment: Rolling walker (2 wheels);Gait belt Nurse Communication: Mobility status  Activity Tolerance: Patient tolerated treatment well Patient left: in chair;with call bell/phone  within reach  OT Visit Diagnosis: Unsteadiness on feet (R26.81);Other abnormalities of gait and mobility (R26.89);Muscle weakness (generalized) (M62.81);Pain Pain - Right/Left: Left Pain - part of body: Leg                Time: 3662-9476 OT Time Calculation (min): 29 min Charges:  OT General Charges $OT Visit: 1 Visit OT Evaluation $OT Eval Moderate Complexity: 1 Mod OT Treatments $Self Care/Home Management : 8-22  mins  Derrik Mceachern MSOT, OTR/L Acute Rehab Pager: [phone] Office: [place] 10/30/2021, 9:13 AM

## 2021-10-30 NOTE — Progress Notes (Signed)
No show

## 2021-10-31 ENCOUNTER — Ambulatory Visit: Payer: BC Managed Care – PPO | Admitting: Infectious Disease

## 2021-10-31 LAB — TYPE AND SCREEN
ABO/RH(D): B POS
Antibody Screen: NEGATIVE
Unit division: 0
Unit division: 0

## 2021-10-31 LAB — BPAM RBC
Blood Product Expiration Date: 202301132359
Blood Product Expiration Date: 202301132359
ISSUE DATE / TIME: 202212220928
ISSUE DATE / TIME: 202212221217
Unit Type and Rh: 7300
Unit Type and Rh: 7300

## 2021-10-31 NOTE — Progress Notes (Signed)
Physical Therapy Treatment Patient Details Name: Bruce Little MRN: [id number] DOB: Dec 14, 1970 Today's Date: 10/31/2021   History of Present Illness 50 yo male presenting for surgical treatment of L foot osteomyelitis. 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    PT Comments    Pt was seen for mobility today, deferred repeating stair training due to his sleepiness related to meds.  Pt is walking short trips in his room with staff, and has been on longer hall walks with staff.  Currently did not walk as far due to meds, but is more comfortable and motivated to keep working.  Follow along with him as his stay permits, and encourage him to walk longer trips as tolerated.  Wife was present again to reinforce safety with the gait belt at home.   Recommendations for follow up therapy are one component of a multi-disciplinary discharge planning process, led by the attending physician.  Recommendations may be updated based on patient status, additional functional criteria and insurance authorization.  Follow Up Recommendations  Home health PT     Assistance Recommended at Discharge Intermittent Supervision/Assistance  Equipment Recommendations  None recommended by PT    Recommendations for Other Services       Precautions / Restrictions Precautions Precautions: Fall Precaution Comments: use belt at home with wife d/t meds Restrictions Weight Bearing Restrictions: Yes LLE Weight Bearing: Non weight bearing Other Position/Activity Restrictions: L BKA     Mobility  Bed Mobility Overal bed mobility: Modified Independent                  Transfers Overall transfer level: Needs assistance Equipment used: Rolling walker (2 wheels);1 person hand held assist Transfers: Sit to/from Stand Sit to Stand: Min assist           General transfer comment: cued hand  placement and awareness of powering up faster to avoid forward shift with LLE being closer to floor    Ambulation/Gait Ambulation/Gait assistance: Min guard;Min assist Gait Distance (Feet): 25 Feet Assistive device: Rolling walker (2 wheels);1 person hand held assist   Gait velocity: reduced Gait velocity interpretation: <1.31 ft/sec, indicative of household ambulator Pre-gait activities: practicing with hand placement and power up General Gait Details: walk to Br and back with pt demonstrating less safety awareness   Stairs Stairs: Yes           Wheelchair Mobility    Modified Rankin (Stroke Patients Only)       Balance Overall balance assessment: Needs assistance Sitting-balance support: Single extremity supported Sitting balance-Leahy Scale: Good       Standing balance-Leahy Scale: Poor                              Cognition Arousal/Alertness: Awake/alert Behavior During Therapy: Flat affect Overall Cognitive Status: Impaired/Different from baseline Area of Impairment: Safety/judgement;Awareness                         Safety/Judgement: Decreased awareness of deficits;Decreased awareness of safety Awareness: Emergent   General Comments: pt is more medicated and feeling sleepy but also a bit unawere of safety        Exercises      General Comments General comments (skin integrity, edema, etc.): pt was sleepy with meds and required close supervision to take to BR,,  nearly fell asleep      Pertinent Vitals/Pain Pain Assessment: Faces Faces Pain Scale: Hurts a little bit Pain Location: LLE Pain Descriptors / Indicators: Sore Pain Intervention(s): Monitored during session;Repositioned    Home Living                          Prior Function            PT Goals (current goals can now be found in the care plan section) Acute Rehab PT Goals Patient Stated Goal: to recover independence and get back to his  routine Progress towards PT goals: PT to reassess next treatment    Frequency    Min 3X/week      PT Plan      Co-evaluation              AM-PAC PT "6 Clicks" Mobility   Outcome Measure  Help needed turning from your back to your side while in a flat bed without using bedrails?: None Help needed moving from lying on your back to sitting on the side of a flat bed without using bedrails?: None Help needed moving to and from a bed to a chair (including a wheelchair)?: A Little Help needed standing up from a chair using your arms (e.g., wheelchair or bedside chair)?: A Little Help needed to walk in hospital room?: A Little Help needed climbing 3-5 steps with a railing? : A Lot 6 Click Score: 19    End of Session Equipment Utilized During Treatment: Gait belt Activity Tolerance: Patient limited by fatigue;Patient limited by lethargy Patient left: in bed;with call bell/phone within reach Nurse Communication: Mobility status PT Visit Diagnosis: Unsteadiness on feet (R26.81);Muscle weakness (generalized) (M62.81)     Time: 3606-7703 PT Time Calculation (min) (ACUTE ONLY): 48 min  Charges:  $Gait Training: 8-22 mins $Therapeutic Activity: 23-37 mins          Ramond Dial 10/31/2021, 5:12 PM  Mee Hives, PT PhD Acute Rehab Dept. Number: [place]

## 2021-11-04 LAB — SURGICAL PATHOLOGY

## 2021-11-05 ENCOUNTER — Ambulatory Visit (INDEPENDENT_AMBULATORY_CARE_PROVIDER_SITE_OTHER): Payer: BC Managed Care – PPO | Admitting: Orthopaedic Surgery

## 2021-11-05 ENCOUNTER — Other Ambulatory Visit: Payer: Self-pay

## 2021-11-05 ENCOUNTER — Encounter: Payer: Self-pay | Admitting: Orthopaedic Surgery

## 2021-11-05 DIAGNOSIS — Z89512 Acquired absence of left leg below knee: Secondary | ICD-10-CM

## 2021-11-05 DIAGNOSIS — S88112A Complete traumatic amputation at level between knee and ankle, left lower leg, initial encounter: Secondary | ICD-10-CM

## 2021-11-05 HISTORY — DX: Complete traumatic amputation at level between knee and ankle, left lower leg, initial encounter: S88.112A

## 2021-11-05 MED ORDER — GABAPENTIN 100 MG PO CAPS: 100.0000 mg | ORAL_CAPSULE | Freq: Three times a day (TID) | ORAL | 3 refills | Status: DC | PRN

## 2021-11-05 NOTE — Progress Notes (Signed)
Post-Op Visit Note   Patient: Bruce Little           Date of Birth: July 10, 1971           MRN: [id number] Visit Date: 11/05/2021 PCP: Physicians, [place]:  Chief Complaint:  Chief Complaint  Patient presents with   Left Knee - Routine Post Op   Visit Diagnoses:  1. Below-knee amputation of left lower extremity ([place])     Plan: Everson is a 1 week status post left BKA.  Overall doing okay.  He does feel like he has more energy and is eating better.  Has occasional phantom limb pain.  Takes oxycodone and Tylenol.  Surgical incision is intact.  No signs of infection or drainage.  Prescription for gabapentin for the phantom limb pain.  He does have prescription for tramadol already that he can try to help lessen the need for oxycodone.  Recheck in 2 weeks for suture removal.  We rewrapped it today and new prescription was given for prosthetic fitting at [facility].  Follow-Up Instructions: Return in about 2 weeks (around 11/19/2021).   Orders:  No orders of the defined types were placed in this encounter.  Meds ordered this encounter  Medications   gabapentin (NEURONTIN) 100 MG capsule    Sig: Take 1-2 capsules (100-200 mg total) by mouth 3 (three) times daily as needed.    Dispense:  30 capsule    Refill:  3    Imaging: No results found.  PMFS History: Patient Active Problem List   Diagnosis Date Noted   Below-knee amputation of left lower extremity ([place]) 11/05/2021   Acute osteomyelitis of left foot ([place]) 09/03/2021   Osteomyelitis of fourth toe of left foot ([place]) 08/13/2021   Osteomyelitis of fifth toe of left foot ([place]) 08/13/2021   Mixed hyperlipidemia 07/31/2021   Paroxysmal A-fib ([place]) 07/30/2021   Medication monitoring encounter 07/28/2021   Carpal tunnel syndrome on right 07/10/2021   Diabetic infection of left foot ([place]) 07/03/2021   CKD (chronic kidney disease) stage 5, GFR less than 15 ml/min ([place]) 07/02/2021   Renal  insufficiency 05/20/2021   DM2 (diabetes mellitus, type 2) ([place]) 05/20/2021   Acute ischemic stroke ([place]) 05/20/2021   HTN (hypertension) 05/20/2021   Past Medical History:  Diagnosis Date   A-fib ([place])    Anemia    low iron   Chronic kidney disease    COVID    has had it 2 times, one mild and one wasn't   DM2 (diabetes mellitus, type 2) ([place])    History of blood transfusion    HTN (hypertension)    Osteomyelitis of fifth toe of left foot ([place]) 08/13/2021   Osteomyelitis of fourth toe of left foot ([place]) 08/13/2021   Pneumonia    Stroke ([place]) 05/19/2021   no residual effects.    Family History  Problem Relation Age of Onset   Stroke Mother    Cancer Mother    Heart disease Father     Past Surgical History:  Procedure Laterality Date   AMPUTATION Left 08/29/2021   Procedure: AMPUTATION OF FOURTH TOE AND RAY ALONG WITH REMAINING FITH METATARSAL;  Surgeon: Leandrew Koyanagi, MD;  Location: [facility];  Service: Orthopedics;  Laterality: Left;   AMPUTATION Left 09/03/2021   Procedure: LISFRANC AMPUTATION;  Surgeon: Leandrew Koyanagi, MD;  Location: [facility];  Service: Orthopedics;  Laterality: Left;   AMPUTATION Left 10/29/2021   Procedure: AMPUTATION BELOW  KNEE -LEFT;  Surgeon: Leandrew Koyanagi, MD;  Location: [facility];  Service: Orthopedics;  Laterality: Left;   APPLICATION OF WOUND VAC Left 07/07/2021   Procedure: APPLICATION OF WOUND VAC;  Surgeon: Leandrew Koyanagi, MD;  Location: [facility];  Service: Orthopedics;  Laterality: Left;   APPLICATION OF WOUND VAC Left 08/29/2021   Procedure: APPLICATION OF WOUND VAC;  Surgeon: Leandrew Koyanagi, MD;  Location: [facility];  Service: Orthopedics;  Laterality: Left;   I & D EXTREMITY Left 07/03/2021   Procedure: IRRIGATION AND DEBRIDEMENT ,FIFTH RAY  AMPUTATION LEFT FOOT, , WOUND VAC PLACEMENT;  Surgeon: Leandrew Koyanagi, MD;  Location: [facility];  Service: Orthopedics;  Laterality: Left;   I & D EXTREMITY Left 07/07/2021   Procedure: IRRIGATION AND DEBRIDEMENT LEFT FOOT;   Surgeon: Leandrew Koyanagi, MD;  Location: [facility];  Service: Orthopedics;  Laterality: Left;   I & D EXTREMITY Left 08/29/2021   Procedure: IRRIGATION AND DEBRIDEMENT LEFT FOOT;  Surgeon: Leandrew Koyanagi, MD;  Location: [facility];  Service: Orthopedics;  Laterality: Left;   IR FLUORO GUIDE CV LINE RIGHT  07/09/2021   IR REMOVAL TUN CV CATH W/O FL  10/08/2021   IR US GUIDE VASC ACCESS RIGHT  07/09/2021   VITRECTOMY Left    [facility]   Social History   Occupational History   Not on file  Tobacco Use   Smoking status: Never   Smokeless tobacco: Never  Vaping Use   Vaping Use: Never used  Substance and Sexual Activity   Alcohol use: Not Currently    Comment: rare   Drug use: Never   Sexual activity: Not on file

## 2021-11-12 ENCOUNTER — Telehealth: Payer: Self-pay | Admitting: Orthopaedic Surgery

## 2021-11-12 NOTE — Telephone Encounter (Signed)
Sonia Side from [facility] needs appointment notes for patients last visit on 12/28. He needs this information to complete the referral request that was sent to him.    Fax#: [phone]

## 2021-11-12 NOTE — Telephone Encounter (Signed)
FAXED

## 2021-11-19 ENCOUNTER — Encounter: Payer: Self-pay | Admitting: Orthopaedic Surgery

## 2021-11-19 ENCOUNTER — Ambulatory Visit (INDEPENDENT_AMBULATORY_CARE_PROVIDER_SITE_OTHER): Payer: BC Managed Care – PPO | Admitting: Physician Assistant

## 2021-11-19 ENCOUNTER — Other Ambulatory Visit: Payer: Self-pay

## 2021-11-19 DIAGNOSIS — Z89512 Acquired absence of left leg below knee: Secondary | ICD-10-CM

## 2021-11-19 DIAGNOSIS — S88112A Complete traumatic amputation at level between knee and ankle, left lower leg, initial encounter: Secondary | ICD-10-CM

## 2021-11-19 MED ORDER — OXYCODONE-ACETAMINOPHEN 5-325 MG PO TABS: 1.0000 | ORAL_TABLET | Freq: Four times a day (QID) | ORAL | 0 refills | Status: DC | PRN

## 2021-11-19 NOTE — Progress Notes (Signed)
Post-Op Visit Note   Patient: Bruce Little           Date of Birth: [birth year]           MRN: [id number] Visit Date: 11/19/2021 PCP: Phys[facility], [facility]   Assessment & Plan:  Chief Complaint:  Chief Complaint  Patient presents with   Left Knee - Pain, Routine Post Op, Follow-up   Visit Diagnoses:  1. Below-knee amputation of left lower extremity ([place])     Plan: Patient is a pleasant 51 year old gentleman who comes in today 3 weeks status post left BKA 10/29/2021.  He has been doing relatively well with the exception of occasional phantom pain which has become less frequent.  He is taking oxycodone for this.  He has almost completed his oral antibiotics.  He does not have a stump shrinker for his BKA but does have an appointment for prosthetic fitting on February 1.  Examination of the left BKA shows a well-healed surgical incision with nylon sutures in place.  No evidence of infection or cellulitis.  Today, sutures were removed and Steri-Strips applied.  Stump shrinker and prosthetic fitting prescriptions were provided.  He will follow-up with [place] in 3 weeks time for recheck.  Call with concerns or questions in meantime.  Follow-Up Instructions: Return in about 3 weeks (around 12/10/2021).   Orders:  No orders of the defined types were placed in this encounter.  Meds ordered this encounter  Medications   oxyCODONE-acetaminophen (PERCOCET) 5-325 MG tablet    Sig: Take 1 tablet by mouth every 6 (six) hours as needed.    Dispense:  30 tablet    Refill:  0    Imaging: No results found.  PMFS History: Patient Active Problem List   Diagnosis Date Noted   Below-knee amputation of left lower extremity ([place]) 11/05/2021   Acute osteomyelitis of left foot ([place]) 09/03/2021   Osteomyelitis of fourth toe of left foot ([place]) 08/13/2021   Osteomyelitis of fifth toe of left foot ([place]) 08/13/2021   Mixed hyperlipidemia 07/31/2021   Paroxysmal A-fib ([place]) 07/30/2021   Medication  monitoring encounter 07/28/2021   Carpal tunnel syndrome on right 07/10/2021   Diabetic infection of left foot ([place]) 07/03/2021   CKD (chronic kidney disease) stage 5, GFR less than 15 ml/min ([place]) 07/02/2021   Renal insufficiency 05/20/2021   DM2 (diabetes mellitus, type 2) ([place]) 05/20/2021   Acute ischemic stroke ([place]) 05/20/2021   HTN (hypertension) 05/20/2021   Past Medical History:  Diagnosis Date   A-fib ([place])    Anemia    low iron   Chronic kidney disease    COVID    has had it 2 times, one mild and one wasn't   DM2 (diabetes mellitus, type 2) ([place])    History of blood transfusion    HTN (hypertension)    Osteomyelitis of fifth toe of left foot ([place]) 08/13/2021   Osteomyelitis of fourth toe of left foot ([place]) 08/13/2021   Pneumonia    Stroke ([place]) 05/19/2021   no residual effects.    Family History  Problem Relation Age of Onset   Stroke Mother    Cancer Mother    Heart disease Father     Past Surgical History:  Procedure Laterality Date   AMPUTATION Left 08/29/2021   Procedure: AMPUTATION OF FOURTH TOE AND RAY ALONG WITH REMAINING FITH METATARSAL;  Surgeon: Leandrew Koyanagi, MD;  Location: [facility];  Service: Orthopedics;  Laterality: Left;   AMPUTATION Left 09/03/2021  Procedure: LISFRANC AMPUTATION;  Surgeon: Leandrew Koyanagi, MD;  Location: [facility];  Service: Orthopedics;  Laterality: Left;   AMPUTATION Left 10/29/2021   Procedure: AMPUTATION BELOW KNEE -LEFT;  Surgeon: Leandrew Koyanagi, MD;  Location: [facility];  Service: Orthopedics;  Laterality: Left;   APPLICATION OF WOUND VAC Left 07/07/2021   Procedure: APPLICATION OF WOUND VAC;  Surgeon: Leandrew Koyanagi, MD;  Location: [facility];  Service: Orthopedics;  Laterality: Left;   APPLICATION OF WOUND VAC Left 08/29/2021   Procedure: APPLICATION OF WOUND VAC;  Surgeon: Leandrew Koyanagi, MD;  Location: [facility];  Service: Orthopedics;  Laterality: Left;   I & D EXTREMITY Left 07/03/2021   Procedure: IRRIGATION AND DEBRIDEMENT ,FIFTH RAY   AMPUTATION LEFT FOOT, , WOUND VAC PLACEMENT;  Surgeon: Leandrew Koyanagi, MD;  Location: [facility];  Service: Orthopedics;  Laterality: Left;   I & D EXTREMITY Left 07/07/2021   Procedure: IRRIGATION AND DEBRIDEMENT LEFT FOOT;  Surgeon: Leandrew Koyanagi, MD;  Location: [facility];  Service: Orthopedics;  Laterality: Left;   I & D EXTREMITY Left 08/29/2021   Procedure: IRRIGATION AND DEBRIDEMENT LEFT FOOT;  Surgeon: Leandrew Koyanagi, MD;  Location: [facility];  Service: Orthopedics;  Laterality: Left;   IR FLUORO GUIDE CV LINE RIGHT  07/09/2021   IR REMOVAL TUN CV CATH W/O FL  10/08/2021   IR US GUIDE VASC ACCESS RIGHT  07/09/2021   VITRECTOMY Left    [facility]   Social History   Occupational History   Not on file  Tobacco Use   Smoking status: Never   Smokeless tobacco: Never  Vaping Use   Vaping Use: Never used  Substance and Sexual Activity   Alcohol use: Not Currently    Comment: rare   Drug use: Never   Sexual activity: Not on file

## 2021-11-24 ENCOUNTER — Ambulatory Visit (HOSPITAL_COMMUNITY)
Admission: RE | Admit: 2021-11-24 | Discharge: 2021-11-24 | Disposition: A | Discharge status: Home / Self Care | Payer: BC Managed Care – PPO | Source: Ambulatory Visit | Attending: Internal Medicine | Admitting: Internal Medicine

## 2021-11-24 ENCOUNTER — Other Ambulatory Visit: Payer: Self-pay

## 2021-11-24 VITALS — BP 124/70 | HR 79 | Temp 97.1°F | Resp 20

## 2021-11-24 DIAGNOSIS — N185 Chronic kidney disease, stage 5: Secondary | ICD-10-CM | POA: Insufficient documentation

## 2021-11-24 LAB — IRON AND TIBC
Iron: 90 ug/dL (ref 45–182)
Saturation Ratios: 49 % — ABNORMAL HIGH (ref 17.9–39.5)
TIBC: 185 ug/dL — ABNORMAL LOW (ref 250–450)
UIBC: 95 ug/dL

## 2021-11-24 LAB — FERRITIN: Ferritin: 345 ng/mL — ABNORMAL HIGH (ref 24–336)

## 2021-11-24 LAB — POCT HEMOGLOBIN-HEMACUE: Hemoglobin: 9.9 g/dL — ABNORMAL LOW (ref 13.0–17.0)

## 2021-11-24 MED ORDER — EPOETIN ALFA-EPBX 40000 UNIT/ML IJ SOLN
INTRAMUSCULAR | Status: AC
  Filled 2021-11-24: qty 1

## 2021-11-24 MED ORDER — EPOETIN ALFA-EPBX 40000 UNIT/ML IJ SOLN
30000.0000 [IU] | INTRAMUSCULAR | Status: DC
  Administered 2021-11-24: 30000 [IU] via SUBCUTANEOUS

## 2021-12-11 ENCOUNTER — Other Ambulatory Visit: Payer: Self-pay

## 2021-12-11 ENCOUNTER — Ambulatory Visit (INDEPENDENT_AMBULATORY_CARE_PROVIDER_SITE_OTHER): Payer: BC Managed Care – PPO | Admitting: Orthopaedic Surgery

## 2021-12-11 ENCOUNTER — Encounter: Payer: Self-pay | Admitting: Orthopaedic Surgery

## 2021-12-11 DIAGNOSIS — Z89512 Acquired absence of left leg below knee: Secondary | ICD-10-CM

## 2021-12-11 DIAGNOSIS — S88112A Complete traumatic amputation at level between knee and ankle, left lower leg, initial encounter: Secondary | ICD-10-CM

## 2021-12-11 MED ORDER — HYDROCODONE-ACETAMINOPHEN 5-325 MG PO TABS: 1.0000 | ORAL_TABLET | Freq: Every evening | ORAL | 0 refills | Status: DC | PRN

## 2021-12-11 MED ORDER — GABAPENTIN 100 MG PO CAPS: 100.0000 mg | ORAL_CAPSULE | Freq: Three times a day (TID) | ORAL | 6 refills | Status: DC | PRN

## 2021-12-11 MED ORDER — TRAMADOL HCL 50 MG PO TABS: 50.0000 mg | ORAL_TABLET | Freq: Two times a day (BID) | ORAL | 2 refills | Status: DC | PRN

## 2021-12-11 NOTE — Progress Notes (Signed)
Post-Op Visit Note   Patient: Bruce Little           Date of Birth: 05/08/1971           MRN: [id number] Visit Date: 12/11/2021 PCP: Phys[facility], [place]:  Chief Complaint:  Chief Complaint  Patient presents with   Left Knee - Pain   Visit Diagnoses:  1. Below-knee amputation of left lower extremity ([place])     Plan: [place] is now 6 weeks status post left BKA for diabetic foot infection.  He has been doing well overall.  They attended a concert last night.  He reports no significant pain but mainly soreness towards the end of the day and night.  Examination of the left lower extremity shows a fully healed surgical scar.  No signs of infection.  No tenderness palpation.  Range of motion of the knee is excellent.  I am very pleased with his recovery.  We will send a referral to the amputation clinic at this time.  He is scheduled to be fitted for the prosthesis in a couple weeks.  I refilled his medications.  He can discontinue the antibiotics at this point.  We will see him back in 8 weeks for recheck.  Follow-Up Instructions: Return in about 8 weeks (around 02/05/2022).   Orders:  Orders Placed This Encounter  Procedures   Ambulatory referral to Physical Therapy   Meds ordered this encounter  Medications   gabapentin (NEURONTIN) 100 MG capsule    Sig: Take 1-2 capsules (100-200 mg total) by mouth 3 (three) times daily as needed.    Dispense:  90 capsule    Refill:  6   HYDROcodone-acetaminophen (NORCO) 5-325 MG tablet    Sig: Take 1-2 tablets by mouth at bedtime as needed.    Dispense:  30 tablet    Refill:  0   traMADol (ULTRAM) 50 MG tablet    Sig: Take 1-2 tablets (50-100 mg total) by mouth every 12 (twelve) hours as needed.    Dispense:  60 tablet    Refill:  2    Imaging: No results found.  PMFS History: Patient Active Problem List   Diagnosis Date Noted   Below-knee amputation of left lower extremity ([place]) 11/05/2021   Acute  osteomyelitis of left foot ([place]) 09/03/2021   Osteomyelitis of fourth toe of left foot ([place]) 08/13/2021   Osteomyelitis of fifth toe of left foot ([place]) 08/13/2021   Mixed hyperlipidemia 07/31/2021   Paroxysmal A-fib ([place]) 07/30/2021   Medication monitoring encounter 07/28/2021   Carpal tunnel syndrome on right 07/10/2021   Diabetic infection of left foot ([place]) 07/03/2021   CKD (chronic kidney disease) stage 5, GFR less than 15 ml/min ([place]) 07/02/2021   Renal insufficiency 05/20/2021   DM2 (diabetes mellitus, type 2) ([place]) 05/20/2021   Acute ischemic stroke ([place]) 05/20/2021   HTN (hypertension) 05/20/2021   Past Medical History:  Diagnosis Date   A-fib ([place])    Anemia    low iron   Chronic kidney disease    COVID    has had it 2 times, one mild and one wasn't   DM2 (diabetes mellitus, type 2) ([place])    History of blood transfusion    HTN (hypertension)    Osteomyelitis of fifth toe of left foot ([place]) 08/13/2021   Osteomyelitis of fourth toe of left foot ([place]) 08/13/2021   Pneumonia    Stroke ([place]) 05/19/2021   no residual effects.  Family History  Problem Relation Age of Onset   Stroke Mother    Cancer Mother    Heart disease Father     Past Surgical History:  Procedure Laterality Date   AMPUTATION Left 08/29/2021   Procedure: AMPUTATION OF FOURTH TOE AND RAY ALONG WITH REMAINING FITH METATARSAL;  Surgeon: Leandrew Koyanagi, MD;  Location: [facility];  Service: Orthopedics;  Laterality: Left;   AMPUTATION Left 09/03/2021   Procedure: LISFRANC AMPUTATION;  Surgeon: Leandrew Koyanagi, MD;  Location: [facility];  Service: Orthopedics;  Laterality: Left;   AMPUTATION Left 10/29/2021   Procedure: AMPUTATION BELOW KNEE -LEFT;  Surgeon: Leandrew Koyanagi, MD;  Location: [facility];  Service: Orthopedics;  Laterality: Left;   APPLICATION OF WOUND VAC Left 07/07/2021   Procedure: APPLICATION OF WOUND VAC;  Surgeon: Leandrew Koyanagi, MD;  Location: [facility];  Service: Orthopedics;  Laterality: Left;    APPLICATION OF WOUND VAC Left 08/29/2021   Procedure: APPLICATION OF WOUND VAC;  Surgeon: Leandrew Koyanagi, MD;  Location: [facility];  Service: Orthopedics;  Laterality: Left;   I & D EXTREMITY Left 07/03/2021   Procedure: IRRIGATION AND DEBRIDEMENT ,FIFTH RAY  AMPUTATION LEFT FOOT, , WOUND VAC PLACEMENT;  Surgeon: Leandrew Koyanagi, MD;  Location: [facility];  Service: Orthopedics;  Laterality: Left;   I & D EXTREMITY Left 07/07/2021   Procedure: IRRIGATION AND DEBRIDEMENT LEFT FOOT;  Surgeon: Leandrew Koyanagi, MD;  Location: [facility];  Service: Orthopedics;  Laterality: Left;   I & D EXTREMITY Left 08/29/2021   Procedure: IRRIGATION AND DEBRIDEMENT LEFT FOOT;  Surgeon: Leandrew Koyanagi, MD;  Location: [facility];  Service: Orthopedics;  Laterality: Left;   IR FLUORO GUIDE CV LINE RIGHT  07/09/2021   IR REMOVAL TUN CV CATH W/O FL  10/08/2021   IR US GUIDE VASC ACCESS RIGHT  07/09/2021   VITRECTOMY Left    [facility]   Social History   Occupational History   Not on file  Tobacco Use   Smoking status: Never   Smokeless tobacco: Never  Vaping Use   Vaping Use: Never used  Substance and Sexual Activity   Alcohol use: Not Currently    Comment: rare   Drug use: Never   Sexual activity: Not on file

## 2021-12-17 ENCOUNTER — Telehealth: Payer: Self-pay

## 2021-12-17 ENCOUNTER — Other Ambulatory Visit: Payer: Self-pay | Admitting: Physician Assistant

## 2021-12-17 MED ORDER — HYDROCODONE-ACETAMINOPHEN 10-325 MG PO TABS: ORAL_TABLET | ORAL | 0 refills | Status: DC

## 2021-12-17 NOTE — Telephone Encounter (Signed)
Sent in norco 10 but 1/2 dose

## 2021-12-17 NOTE — Telephone Encounter (Signed)
[facility] called stating that Rx for Hydrocodone-acetaminophen is currently on back order and would like to know if there an alternative Rx for patient?  Cb# [phone].  Please advise.  Thank you.

## 2021-12-19 NOTE — Telephone Encounter (Signed)
Called patient no answer LMOM.

## 2021-12-22 ENCOUNTER — Ambulatory Visit (HOSPITAL_COMMUNITY)
Admission: RE | Admit: 2021-12-22 | Discharge: 2021-12-22 | Disposition: A | Discharge status: Home / Self Care | Payer: BC Managed Care – PPO | Source: Ambulatory Visit | Attending: Internal Medicine | Admitting: Internal Medicine

## 2021-12-22 ENCOUNTER — Other Ambulatory Visit: Payer: Self-pay

## 2021-12-22 VITALS — BP 159/90 | HR 85 | Resp 20

## 2021-12-22 DIAGNOSIS — N185 Chronic kidney disease, stage 5: Secondary | ICD-10-CM | POA: Insufficient documentation

## 2021-12-22 LAB — IRON AND TIBC
Iron: 88 ug/dL (ref 45–182)
Saturation Ratios: 48 % — ABNORMAL HIGH (ref 17.9–39.5)
TIBC: 183 ug/dL — ABNORMAL LOW (ref 250–450)
UIBC: 95 ug/dL

## 2021-12-22 LAB — FERRITIN: Ferritin: 246 ng/mL (ref 24–336)

## 2021-12-22 MED ORDER — EPOETIN ALFA-EPBX 40000 UNIT/ML IJ SOLN
30000.0000 [IU] | INTRAMUSCULAR | Status: DC
  Administered 2021-12-22: 30000 [IU] via SUBCUTANEOUS

## 2021-12-22 MED ORDER — EPOETIN ALFA-EPBX 40000 UNIT/ML IJ SOLN
INTRAMUSCULAR | Status: AC
  Filled 2021-12-22: qty 1

## 2021-12-23 LAB — POCT HEMOGLOBIN-HEMACUE: Hemoglobin: 10.3 g/dL — ABNORMAL LOW (ref 13.0–17.0)

## 2022-01-19 ENCOUNTER — Ambulatory Visit (HOSPITAL_COMMUNITY)
Admission: RE | Admit: 2022-01-19 | Discharge: 2022-01-19 | Disposition: A | Discharge status: Home / Self Care | Payer: BC Managed Care – PPO | Source: Ambulatory Visit | Attending: Internal Medicine | Admitting: Internal Medicine

## 2022-01-19 ENCOUNTER — Other Ambulatory Visit: Payer: Self-pay

## 2022-01-19 VITALS — BP 147/95 | HR 90 | Resp 20

## 2022-01-19 DIAGNOSIS — N185 Chronic kidney disease, stage 5: Secondary | ICD-10-CM | POA: Insufficient documentation

## 2022-01-19 LAB — IRON AND TIBC
Iron: 52 ug/dL (ref 45–182)
Saturation Ratios: 25 % (ref 17.9–39.5)
TIBC: 210 ug/dL — ABNORMAL LOW (ref 250–450)
UIBC: 158 ug/dL

## 2022-01-19 LAB — RENAL FUNCTION PANEL
Albumin: 2.6 g/dL — ABNORMAL LOW (ref 3.5–5.0)
Anion gap: 10 (ref 5–15)
BUN: 63 mg/dL — ABNORMAL HIGH (ref 6–20)
CO2: 21 mmol/L — ABNORMAL LOW (ref 22–32)
Calcium: 8.7 mg/dL — ABNORMAL LOW (ref 8.9–10.3)
Chloride: 102 mmol/L (ref 98–111)
Creatinine, Ser: 3.02 mg/dL — ABNORMAL HIGH (ref 0.61–1.24)
GFR, Estimated: 24 mL/min — ABNORMAL LOW (ref >60)
Glucose, Bld: 223 mg/dL — ABNORMAL HIGH (ref 70–99)
Phosphorus: 3.2 mg/dL (ref 2.5–4.6)
Potassium: 4 mmol/L (ref 3.5–5.1)
Sodium: 133 mmol/L — ABNORMAL LOW (ref 135–145)

## 2022-01-19 LAB — FERRITIN: Ferritin: 187 ng/mL (ref 24–336)

## 2022-01-19 LAB — POCT HEMOGLOBIN-HEMACUE: Hemoglobin: 12.5 g/dL — ABNORMAL LOW (ref 13.0–17.0)

## 2022-01-19 MED ORDER — EPOETIN ALFA-EPBX 40000 UNIT/ML IJ SOLN
INTRAMUSCULAR | Status: AC
  Filled 2022-01-19: qty 1

## 2022-01-19 MED ORDER — EPOETIN ALFA-EPBX 40000 UNIT/ML IJ SOLN: 30000.0000 [IU] | INTRAMUSCULAR | Status: DC

## 2022-01-20 LAB — PTH, INTACT AND CALCIUM
Calcium, Total (PTH): 8.7 mg/dL (ref 8.7–10.2)
PTH: 53 pg/mL (ref 15–65)

## 2022-02-02 ENCOUNTER — Ambulatory Visit (HOSPITAL_COMMUNITY)
Admission: RE | Admit: 2022-02-02 | Discharge: 2022-02-02 | Disposition: A | Discharge status: Home / Self Care | Payer: BC Managed Care – PPO | Source: Ambulatory Visit | Attending: Internal Medicine | Admitting: Internal Medicine

## 2022-02-02 ENCOUNTER — Other Ambulatory Visit: Payer: Self-pay

## 2022-02-02 ENCOUNTER — Ambulatory Visit: Payer: BC Managed Care – PPO | Admitting: Adult Health

## 2022-02-02 VITALS — BP 153/99 | HR 84 | Temp 97.3°F | Resp 20

## 2022-02-02 DIAGNOSIS — N185 Chronic kidney disease, stage 5: Secondary | ICD-10-CM | POA: Insufficient documentation

## 2022-02-02 LAB — POCT HEMOGLOBIN-HEMACUE: Hemoglobin: 10.3 g/dL — ABNORMAL LOW (ref 13.0–17.0)

## 2022-02-02 MED ORDER — EPOETIN ALFA-EPBX 10000 UNIT/ML IJ SOLN
20000.0000 [IU] | INTRAMUSCULAR | Status: DC
  Administered 2022-02-02: 20000 [IU] via SUBCUTANEOUS

## 2022-02-02 MED ORDER — EPOETIN ALFA-EPBX 10000 UNIT/ML IJ SOLN
INTRAMUSCULAR | Status: AC
  Filled 2022-02-02: qty 2

## 2022-02-05 ENCOUNTER — Encounter: Payer: Self-pay | Admitting: Orthopaedic Surgery

## 2022-02-05 ENCOUNTER — Ambulatory Visit (INDEPENDENT_AMBULATORY_CARE_PROVIDER_SITE_OTHER): Payer: BC Managed Care – PPO | Admitting: Orthopaedic Surgery

## 2022-02-05 DIAGNOSIS — S88112A Complete traumatic amputation at level between knee and ankle, left lower leg, initial encounter: Secondary | ICD-10-CM

## 2022-02-05 NOTE — Progress Notes (Signed)
? ?Post-Op Visit Note ?  ?Patient: Bruce Little           ?Date of Birth: 12/27/1970           ?MRN: [id number] ?Visit Date: 02/05/2022 ?PCP: Phys[facility], [facility] ? ? ?Assessment & Plan: ? ?Chief Complaint:  ?Chief Complaint  ?Patient presents with  ? Left Knee - Pain  ? ?Visit Diagnoses:  ?1. Below-knee amputation of left lower extremity ([place])   ? ? ?Plan: Bruce Little is 3 months status post left BKA.  He is doing better overall.  He has some soreness.  He just started walking with a prosthesis with a walker.  Overall no real complaints. ? ?Examination of the left lower extremity shows a fully healed potation stump.  No signs of infection.  He has a well fitting prosthetic. ? ?At this point Bruce Little is doing well and he will continue to increase his stamina through activity.  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  Work note was provided today.  Permanent handicap placard provided today. ? ?Follow-Up Instructions: Return in about 3 months (around 05/08/2022).  ? ?Orders:  ?No orders of the defined types were placed in this encounter. ? ?No orders of the defined types were placed in this encounter. ? ? ?Imaging: ?No results found. ? ?PMFS History: ?Patient Active Problem List  ? Diagnosis Date Noted  ? Below-knee amputation of left lower extremity ([place]) 11/05/2021  ? Acute osteomyelitis of left foot ([place]) 09/03/2021  ? Osteomyelitis of fourth toe of left foot ([place]) 08/13/2021  ? Osteomyelitis of fifth toe of left foot ([place]) 08/13/2021  ? Mixed hyperlipidemia 07/31/2021  ? Paroxysmal A-fib ([place]) 07/30/2021  ? Medication monitoring encounter 07/28/2021  ? Carpal tunnel syndrome on right 07/10/2021  ? Diabetic infection of left foot ([place]) 07/03/2021  ? CKD (chronic kidney disease) stage 5, GFR less than 15 ml/min ([facility]) 07/02/2021  ? Renal insufficiency 05/20/2021  ? DM2 (diabetes mellitus, type 2) ([place]) 05/20/2021  ? Acute ischemic stroke ([place]) 05/20/2021  ? HTN (hypertension) 05/20/2021  ? ?Past Medical  History:  ?Diagnosis Date  ? A-fib ([place])   ? Anemia   ? low iron  ? Chronic kidney disease   ? COVID   ? has had it 2 times, one mild and one wasn't  ? DM2 (diabetes mellitus, type 2) ([place])   ? History of blood transfusion   ? HTN (hypertension)   ? Osteomyelitis of fifth toe of left foot ([place]) 08/13/2021  ? Osteomyelitis of fourth toe of left foot ([place]) 08/13/2021  ? Pneumonia   ? Stroke ([place]) 05/19/2021  ? no residual effects.  ?  ?Family History  ?Problem Relation Age of Onset  ? Stroke Mother   ? Cancer Mother   ? Heart disease Father   ?  ?Past Surgical History:  ?Procedure Laterality Date  ? AMPUTATION Left 08/29/2021  ? Procedure: AMPUTATION OF FOURTH TOE AND RAY ALONG WITH REMAINING FITH METATARSAL;  Surgeon: Leandrew Koyanagi, MD;  Location: [facility];  Service: Orthopedics;  Laterality: Left;  ? AMPUTATION Left 09/03/2021  ? Procedure: LISFRANC AMPUTATION;  Surgeon: Leandrew Koyanagi, MD;  Location: [facility];  Service: Orthopedics;  Laterality: Left;  ? AMPUTATION Left 10/29/2021  ? Procedure: AMPUTATION BELOW KNEE -LEFT;  Surgeon: Leandrew Koyanagi, MD;  Location: [facility];  Service: Orthopedics;  Laterality: Left;  ? APPLICATION OF WOUND VAC Left 07/07/2021  ? Procedure: APPLICATION OF WOUND VAC;  Surgeon:  Leandrew Koyanagi, MD;  Location: [facility];  Service: Orthopedics;  Laterality: Left;  ? APPLICATION OF WOUND VAC Left 08/29/2021  ? Procedure: APPLICATION OF WOUND VAC;  Surgeon: Leandrew Koyanagi, MD;  Location: [facility];  Service: Orthopedics;  Laterality: Left;  ? I & D EXTREMITY Left 07/03/2021  ? Procedure: IRRIGATION AND DEBRIDEMENT ,FIFTH RAY  AMPUTATION LEFT FOOT, , WOUND VAC PLACEMENT;  Surgeon: Leandrew Koyanagi, MD;  Location: [facility];  Service: Orthopedics;  Laterality: Left;  ? I & D EXTREMITY Left 07/07/2021  ? Procedure: IRRIGATION AND DEBRIDEMENT LEFT FOOT;  Surgeon: Leandrew Koyanagi, MD;  Location: [facility];  Service: Orthopedics;  Laterality: Left;  ? I & D EXTREMITY Left 08/29/2021  ? Procedure: IRRIGATION AND DEBRIDEMENT LEFT  FOOT;  Surgeon: Leandrew Koyanagi, MD;  Location: [facility];  Service: Orthopedics;  Laterality: Left;  ? IR FLUORO GUIDE CV LINE RIGHT  07/09/2021  ? IR REMOVAL TUN CV CATH W/O FL  10/08/2021  ? IR US GUIDE VASC ACCESS RIGHT  07/09/2021  ? VITRECTOMY Left   ? [facility]  ? ?Social History  ? ?Occupational History  ? Not on file  ?Tobacco Use  ? Smoking status: Never  ? Smokeless tobacco: Never  ?Vaping Use  ? Vaping Use: Never used  ?Substance and Sexual Activity  ? Alcohol use: Not Currently  ?  Comment: rare  ? Drug use: Never  ? Sexual activity: Not on file  ? ? ? ?

## 2022-02-16 ENCOUNTER — Telehealth: Payer: BC Managed Care – PPO | Admitting: Adult Health

## 2022-02-16 ENCOUNTER — Encounter (HOSPITAL_COMMUNITY): Payer: BC Managed Care – PPO

## 2022-02-23 ENCOUNTER — Telehealth (INDEPENDENT_AMBULATORY_CARE_PROVIDER_SITE_OTHER): Payer: BC Managed Care – PPO | Admitting: Adult Health

## 2022-02-23 ENCOUNTER — Encounter: Payer: Self-pay | Admitting: Adult Health

## 2022-02-23 DIAGNOSIS — I639 Cerebral infarction, unspecified: Secondary | ICD-10-CM | POA: Diagnosis not present

## 2022-02-23 NOTE — Patient Instructions (Signed)
Continue Eliquis (apixaban) 5mg  twice daily and atorvastatin  for secondary stroke prevention ? ?Continue to follow up with PCP regarding cholesterol, blood pressure and diabetes  management  ?Maintain strict control of hypertension with blood pressure goal below 130/90, diabetes with hemoglobin A1c goal below 7.0 % and cholesterol with LDL cholesterol (bad cholesterol) goal below 70 mg/dL.  ? ?Signs of a Stroke? Follow the BEFAST method:  ?Balance Watch for a sudden loss of balance, trouble with coordination or vertigo ?Eyes Is there a sudden loss of vision in one or both eyes? Or double vision?  ?Face: Ask the person to smile. Does one side of the face droop or is it numb?  ?Arms: Ask the person to raise both arms. Does one arm drift downward? Is there weakness or numbness of a leg? ?Speech: Ask the person to repeat a simple phrase. Does the speech sound slurred/strange? Is the person confused ? ?Time: If you observe any of these signs, call 911. ? ? ? ? ? ? ?Thank you for coming to see [place] at [facility]. I hope we have been able to provide you high quality care today. ? ?You may receive a patient satisfaction survey over the next few weeks. We would appreciate your feedback and comments so that we may continue to improve ourselves and the health of our patients. ? ?

## 2022-02-23 NOTE — Progress Notes (Signed)
?[facility] ?[address] ?[place]. [place] [address] ?[phone] ? ?     STROKE FOLLOW UP NOTE ? ?[name] ?Date of Birth:  September 28, 1971 ?Medical Record Number:  [id number]  ? ?Reason for Referral: stroke follow up ? ?Virtual Visit via Video Note ? ?Virtual visit completed through MyChart, a video enabled telemedicine application. Due to national recommendations of social distancing due to COVID-19, a virtual visit is felt to be most appropriate for this patient at this time. Reviewed limitations, risks, security and privacy concerns of performing a virtual visit and the availability of in person appointments. I also reviewed that there may be a patient responsible charge related to this service. The patient agreed to proceed.  ?  ?Patient location: home ?Provider location: in office, [facility] ?Persons participating in this virtual visit: Patient and provider ?  ? ? ?SUBJECTIVE: ? ? ?CHIEF COMPLAINT:  ?Stroke follow-up ? ? ?HPI:  ? ?Update 02/23/2022 JM: Patient returns for stroke follow-up after prior visit 7 months ago.  Stable from stroke standpoint without new stroke/TIA symptoms.  Remains on Eliquis 5 mg twice daily and atorvastatin, denies side effects.  Blood pressure routinely monitored at home which has been stable - typically 120s/80s. Closely follows with PCP and cardiology. Reports continued weight loss with current weight 195 pounds down from 260 lbs. 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 Erlinda Little.  He has been slowly trying to work with prosthetic, closely followed by orthopedics Dr. Erlinda Little. 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  No further concerns no further concerns at this time ? ? ? ?History provided for reference purposes only ?Initial visit 08/04/2021 JM: [name] is being seen for hospital follow-up  accompanied by his wife, Bruce Little.  Overall stable from stroke standpoint.  Denies new or reoccurring stroke/TIA symptoms.  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 Post op, developed A. fib and placed on Eliquis 5 mg twice daily. He has been gradually recovering, remains nonweightbearing and use of wound VAC -routinely followed by infectious disease, orthopedics and wound care.  He has remained on Eliquis 5 mg twice daily as well as atorvastatin and fenofibrate tolerating without side effects.  Blood pressure today 157/97. Routinely monitors at home which has been fluctuating based on pain and activity levels. Glucose levels monitored at home and typically 100-130.  He is questioning undergoing retina surgery previously scheduled on 8/9 but in setting of stroke, this was postponed until follow up with our office (initially scheduled f/u on 8/31 but rescheduled as he was hospitalized).  No further concerns at this time. ? ?Stroke admission 05/19/2021 ?[name] is a 51 y.o. male with history of DM2, HTN, who presented on 05/19/2021 with Left sided weakness and numbness since 7/9.  Personally reviewed hospitalization pertinent progress notes, lab work and imaging.  Evaluated by Dr. Erlinda Little for right caudate infarct secondary to small vessel disease source.  Consult evidence of old right thalamus infarct and right BG ICH.  MRA head mild stenosis proximal L P2 segment.  Carotid Doppler right ICA 40 to 59% stenosis in left ICA 1 to 39% stenosis. Was not felt to symptomatic but did recommend follow-up with VVS  OP.  EF 60 to 65%.  Direct LDL 122.9 and TG 456-initiated atorvastatin 80 mg daily and fenofibrate 160 mg daily.  A1c 7.4. He was found to have severe elevated creatinine level as well as significantly elevated BP.  Evaluated by nephrology with plans on completing renal biopsy OP.   Recommended DAPT for 3 weeks then Plavix alone as on aspirin PTA.  Evaluated by therapies without therapy needs and discharged home. ? ? ? ? ? ?PERTINENT IMAGING ? ?MR BRAIN 05/19/2021 ?MR ANGIO HEAD ?IMPRESSION: ?1. Small acute/early subacute infarct of the right caudate body. No ?hemorrhage or mass effect. ?2. 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 ?3. Mild stenosis of the proximal left P2 segment. ? ?VAS US CAROTID DUPLEX 05/20/2021 ?Summary:  ?Right Carotid: Velocities in the right ICA are consistent with a 40-59% stenosis.  ?Left Carotid: Velocities in the left ICA are consistent with a 1-39% stenosis.  ?Vertebrals: Bilateral vertebral arteries demonstrate antegrade flow.  ? ?ECHO 05/20/2021 ?IMPRESSIONS  ? 1. Left ventricular ejection fraction, by estimation, is 60 to 65%. The  ?left ventricle has normal function. The left ventricle has no regional  ?wall motion abnormalities. There is mild concentric left ventricular  ?hypertrophy. Left ventricular diastolic  ?function could not be evaluated.  ? 2. Right ventricular systolic function is normal. The right ventricular  ?size is normal. Tricuspid regurgitation signal is inadequate for assessing  ?PA pressure.  ? 3. Left atrial size was mildly dilated.  ? 4. The mitral valve is normal in structure. Trivial mitral valve  ?regurgitation. No evidence of mitral stenosis.  ? 5. The aortic valve is normal in structure. Aortic valve regurgitation is  ?not visualized. No aortic stenosis is present.  ? 6. The inferior vena cava is normal in size with <50% respiratory  ?variability, suggesting right atrial pressure of 8 mmHg.  ? ? ? ?ROS:   ?14 system review of systems performed and negative with exception of those listed in HPI ? ?PMH:  ?Past Medical History:  ?Diagnosis Date  ? A-fib ([place])   ? Anemia   ? low iron  ? Chronic kidney disease   ? COVID   ? has had it 2 times, one mild and one wasn't  ? DM2 (diabetes mellitus, type 2) ([place])   ? History of blood  transfusion   ? HTN (hypertension)   ? Osteomyelitis of fifth toe of left foot ([place]) 08/13/2021  ? Osteomyelitis of fourth toe of left foot ([place]) 08/13/2021  ? Pneumonia   ? Stroke ([place]) 05/19/2021  ? no residual effects.  ? ? ?PSH:  ?Past Surgical History:  ?Procedure Laterality Date  ? AMPUTATION Left 08/29/2021  ? Procedure: AMPUTATION OF FOURTH TOE AND RAY ALONG WITH REMAINING FITH METATARSAL;  Surgeon: Leandrew Koyanagi, MD;  Location: [facility];  Service: Orthopedics;  Laterality: Left;  ? AMPUTATION Left 09/03/2021  ? Procedure: LISFRANC AMPUTATION;  Surgeon: Leandrew Koyanagi, MD;  Location: [facility];  Service: Orthopedics;  Laterality: Left;  ? AMPUTATION Left 10/29/2021  ? Procedure: AMPUTATION BELOW KNEE -LEFT;  Surgeon: Leandrew Koyanagi, MD;  Location: [facility];  Service: Orthopedics;  Laterality: Left;  ? APPLICATION OF WOUND VAC Left 07/07/2021  ? Procedure: APPLICATION OF WOUND VAC;  Surgeon: Leandrew Koyanagi, MD;  Location: [facility];  Service: Orthopedics;  Laterality: Left;  ? APPLICATION OF WOUND VAC Left 08/29/2021  ? Procedure: APPLICATION OF WOUND VAC;  Surgeon: Leandrew Koyanagi, MD;  Location: [facility];  Service: Orthopedics;  Laterality: Left;  ? I & D EXTREMITY Left 07/03/2021  ? Procedure: IRRIGATION AND DEBRIDEMENT ,FIFTH RAY  AMPUTATION LEFT FOOT, , WOUND VAC PLACEMENT;  Surgeon: Leandrew Koyanagi, MD;  Location: [facility];  Service: Orthopedics;  Laterality: Left;  ? I & D EXTREMITY Left 07/07/2021  ? Procedure: IRRIGATION AND DEBRIDEMENT LEFT FOOT;  Surgeon: Leandrew Koyanagi, MD;  Location: [employer] [facility];  Service: Orthopedics;  Laterality: Left;  ? I & D EXTREMITY Left 08/29/2021  ? Procedure: IRRIGATION AND DEBRIDEMENT LEFT FOOT;  Surgeon: Leandrew Koyanagi, MD;  Location: [facility];  Service: Orthopedics;  Laterality: Left;  ? IR FLUORO GUIDE CV LINE RIGHT  07/09/2021  ? IR REMOVAL TUN CV CATH W/O FL  10/08/2021  ? IR US GUIDE VASC ACCESS RIGHT  07/09/2021  ? VITRECTOMY Left   ? [facility]  ? ? ?Social History:  ?Social History   ? ?Socioeconomic History  ? Marital status: Married  ?  Spouse name: Bruce Little  ? Number of children: Not on file  ? Years of education: Not on file  ? Highest education level: Not on file  ?Occupational History  ? Not

## 2022-02-24 ENCOUNTER — Telehealth: Payer: Self-pay

## 2022-02-24 NOTE — Telephone Encounter (Signed)
Bruce Little with  [facility] states that he has seen the patient is he is wanting to return to work however recommends that patient see Bruce Little to start physical therapy. Patient is s/p below knee amputation of the left on 10/29/2021. ?

## 2022-02-24 NOTE — Telephone Encounter (Signed)
Can you check on this?

## 2022-02-27 ENCOUNTER — Encounter: Payer: Self-pay | Admitting: Orthopaedic Surgery

## 2022-02-27 ENCOUNTER — Other Ambulatory Visit: Payer: Self-pay | Admitting: Orthopaedic Surgery

## 2022-02-27 MED ORDER — CEPHALEXIN 500 MG PO CAPS: 500.0000 mg | ORAL_CAPSULE | Freq: Four times a day (QID) | ORAL | 0 refills | Status: AC

## 2022-03-02 ENCOUNTER — Telehealth: Payer: Self-pay | Admitting: Orthopaedic Surgery

## 2022-03-02 ENCOUNTER — Ambulatory Visit (HOSPITAL_COMMUNITY)
Admission: RE | Admit: 2022-03-02 | Discharge: 2022-03-02 | Disposition: A | Discharge status: Home / Self Care | Payer: BC Managed Care – PPO | Source: Ambulatory Visit | Attending: Internal Medicine | Admitting: Internal Medicine

## 2022-03-02 VITALS — BP 153/88 | HR 74 | Temp 97.6°F | Resp 20

## 2022-03-02 DIAGNOSIS — N185 Chronic kidney disease, stage 5: Secondary | ICD-10-CM | POA: Diagnosis present

## 2022-03-02 LAB — IRON AND TIBC
Iron: 44 ug/dL — ABNORMAL LOW (ref 45–182)
Saturation Ratios: 16 % — ABNORMAL LOW (ref 17.9–39.5)
TIBC: 280 ug/dL (ref 250–450)
UIBC: 236 ug/dL

## 2022-03-02 LAB — FERRITIN: Ferritin: 150 ng/mL (ref 24–336)

## 2022-03-02 MED ORDER — EPOETIN ALFA-EPBX 10000 UNIT/ML IJ SOLN: 20000.0000 [IU] | INTRAMUSCULAR | Status: DC

## 2022-03-02 MED ORDER — EPOETIN ALFA-EPBX 10000 UNIT/ML IJ SOLN
INTRAMUSCULAR | Status: AC
  Administered 2022-03-02: 20000 [IU] via SUBCUTANEOUS
  Filled 2022-03-02: qty 2

## 2022-03-02 NOTE — Telephone Encounter (Signed)
Patient called. Says he spoke with Dr. Erlinda Hong and he wants him in tomorrow morning. His call back number is [phone] ?

## 2022-03-03 ENCOUNTER — Encounter: Payer: Self-pay | Admitting: Orthopaedic Surgery

## 2022-03-03 ENCOUNTER — Ambulatory Visit (INDEPENDENT_AMBULATORY_CARE_PROVIDER_SITE_OTHER): Payer: BC Managed Care – PPO | Admitting: Orthopaedic Surgery

## 2022-03-03 DIAGNOSIS — Z89512 Acquired absence of left leg below knee: Secondary | ICD-10-CM | POA: Diagnosis not present

## 2022-03-03 DIAGNOSIS — S88112A Complete traumatic amputation at level between knee and ankle, left lower leg, initial encounter: Secondary | ICD-10-CM

## 2022-03-03 LAB — POCT HEMOGLOBIN-HEMACUE: Hemoglobin: 10.3 g/dL — ABNORMAL LOW (ref 13.0–17.0)

## 2022-03-03 NOTE — Progress Notes (Signed)
? ?Office Visit Note ?  ?Patient: Bruce Little           ?Date of Birth: [birth year]           ?MRN: [id number] ?Visit Date: 03/03/2022 ?             ?Requested by: Emmaline Kluver, MD ?[address] ?[place],  [place] [address] ?PCP: Street, Sharon Mt, MD ? ? ?Assessment & Plan: ?Visit Diagnoses:  ?1. Below-knee amputation of left lower extremity ([place])   ? ? ?Plan: Lamarius returns today for a wound check of his BKA stump.  He was using his prosthesis quite a bit last week and he got really sweaty and when he took off the prosthesis he noticed breakdown of the skin overlying the tip of the tibia.  Denies any constitutional symptoms.  Had some clear drainage from the area.  His wife has been treating it with mupirocin and a Band-Aid.  He has started the Keflex that I sent in over the weekend. ? ?Examination of the left BKA stump shows small breakdown of the skin and dermal layer.  The subcutaneous layer is intact.  This was probed with a sterile Q-tip which did not show any penetration down to the bone.   ? ?We we will have him stop wearing the prosthesis so that the wound can fully heal.  He will take antibiotics for 2 weeks.  Mupirocin ointment and Band-Aid for 2 weeks.  I have written him out of work for 2 weeks until follow-up. ? ?Follow-Up Instructions: Return in about 2 weeks (around 03/17/2022).  ? ?Orders:  ?No orders of the defined types were placed in this encounter. ? ?No orders of the defined types were placed in this encounter. ? ? ? ? Procedures: ?No procedures performed ? ? ?Clinical Data: ?No additional findings. ? ? ?Subjective: ?Chief Complaint  ?Patient presents with  ? Left Knee - Pain, Follow-up  ? ? ?HPI ? ?Review of Systems ? ? ?Objective: ?Vital Signs: There were no vitals taken for this visit. ? ?Physical Exam ? ?Ortho Exam ? ?Specialty Comments:  ?No specialty comments available. ? ?Imaging: ?No results found. ? ? ?PMFS History: ?Patient Active Problem List  ? Diagnosis Date Noted  ?  Below-knee amputation of left lower extremity ([place]) 11/05/2021  ? Acute osteomyelitis of left foot ([place]) 09/03/2021  ? Osteomyelitis of fourth toe of left foot ([place]) 08/13/2021  ? Osteomyelitis of fifth toe of left foot ([place]) 08/13/2021  ? Mixed hyperlipidemia 07/31/2021  ? Paroxysmal A-fib ([place]) 07/30/2021  ? Medication monitoring encounter 07/28/2021  ? Carpal tunnel syndrome on right 07/10/2021  ? Diabetic infection of left foot ([place]) 07/03/2021  ? CKD (chronic kidney disease) stage 5, GFR less than 15 ml/min (HCC) 07/02/2021  ? Renal insufficiency 05/20/2021  ? DM2 (diabetes mellitus, type 2) ([place]) 05/20/2021  ? Acute ischemic stroke ([place]) 05/20/2021  ? HTN (hypertension) 05/20/2021  ? ?Past Medical History:  ?Diagnosis Date  ? A-fib ([place])   ? Anemia   ? low iron  ? Chronic kidney disease   ? COVID   ? has had it 2 times, one mild and one wasn't  ? DM2 (diabetes mellitus, type 2) ([place])   ? History of blood transfusion   ? HTN (hypertension)   ? Osteomyelitis of fifth toe of left foot ([place]) 08/13/2021  ? Osteomyelitis of fourth toe of left foot ([place]) 08/13/2021  ? Pneumonia   ? Stroke ([place]) 05/19/2021  ? no  residual effects.  ?  ?Family History  ?Problem Relation Age of Onset  ? Stroke Mother   ? Cancer Mother   ? Heart disease Father   ?  ?Past Surgical History:  ?Procedure Laterality Date  ? AMPUTATION Left 08/29/2021  ? Procedure: AMPUTATION OF FOURTH TOE AND RAY ALONG WITH REMAINING FITH METATARSAL;  Surgeon: Leandrew Koyanagi, MD;  Location: [facility];  Service: Orthopedics;  Laterality: Left;  ? AMPUTATION Left 09/03/2021  ? Procedure: LISFRANC AMPUTATION;  Surgeon: Leandrew Koyanagi, MD;  Location: [facility];  Service: Orthopedics;  Laterality: Left;  ? AMPUTATION Left 10/29/2021  ? Procedure: AMPUTATION BELOW KNEE -LEFT;  Surgeon: Leandrew Koyanagi, MD;  Location: [facility];  Service: Orthopedics;  Laterality: Left;  ? APPLICATION OF WOUND VAC Left 07/07/2021  ? Procedure: APPLICATION OF WOUND VAC;  Surgeon: Leandrew Koyanagi, MD;   Location: [facility];  Service: Orthopedics;  Laterality: Left;  ? APPLICATION OF WOUND VAC Left 08/29/2021  ? Procedure: APPLICATION OF WOUND VAC;  Surgeon: Leandrew Koyanagi, MD;  Location: [facility];  Service: Orthopedics;  Laterality: Left;  ? I & D EXTREMITY Left 07/03/2021  ? Procedure: IRRIGATION AND DEBRIDEMENT ,FIFTH RAY  AMPUTATION LEFT FOOT, , WOUND VAC PLACEMENT;  Surgeon: Leandrew Koyanagi, MD;  Location: [facility];  Service: Orthopedics;  Laterality: Left;  ? I & D EXTREMITY Left 07/07/2021  ? Procedure: IRRIGATION AND DEBRIDEMENT LEFT FOOT;  Surgeon: Leandrew Koyanagi, MD;  Location: [facility];  Service: Orthopedics;  Laterality: Left;  ? I & D EXTREMITY Left 08/29/2021  ? Procedure: IRRIGATION AND DEBRIDEMENT LEFT FOOT;  Surgeon: Leandrew Koyanagi, MD;  Location: [facility];  Service: Orthopedics;  Laterality: Left;  ? IR FLUORO GUIDE CV LINE RIGHT  07/09/2021  ? IR REMOVAL TUN CV CATH W/O FL  10/08/2021  ? IR US GUIDE VASC ACCESS RIGHT  07/09/2021  ? VITRECTOMY Left   ? [facility]  ? ?Social History  ? ?Occupational History  ? Not on file  ?Tobacco Use  ? Smoking status: Never  ? Smokeless tobacco: Never  ?Vaping Use  ? Vaping Use: Never used  ?Substance and Sexual Activity  ? Alcohol use: Not Currently  ?  Comment: rare  ? Drug use: Never  ? Sexual activity: Not on file  ? ? ? ? ? ? ?

## 2022-03-03 NOTE — Telephone Encounter (Signed)
Bruce Little is not here this AM.  ? ?He can come in at 1pm. Left VM. ?

## 2022-03-13 ENCOUNTER — Encounter: Payer: Self-pay | Admitting: Orthopaedic Surgery

## 2022-03-13 ENCOUNTER — Ambulatory Visit (INDEPENDENT_AMBULATORY_CARE_PROVIDER_SITE_OTHER): Payer: BC Managed Care – PPO | Admitting: Orthopaedic Surgery

## 2022-03-13 DIAGNOSIS — Z89512 Acquired absence of left leg below knee: Secondary | ICD-10-CM | POA: Diagnosis not present

## 2022-03-13 DIAGNOSIS — S88112A Complete traumatic amputation at level between knee and ankle, left lower leg, initial encounter: Secondary | ICD-10-CM

## 2022-03-13 MED ORDER — TRAMADOL HCL 50 MG PO TABS: 50.0000 mg | ORAL_TABLET | Freq: Two times a day (BID) | ORAL | 2 refills | Status: DC | PRN

## 2022-03-13 NOTE — Progress Notes (Signed)
? ?Office Visit Note ?  ?Patient: Bruce Little           ?Date of Birth: Mar 10, 1971           ?MRN: [id number] ?Visit Date: 03/13/2022 ?             ?Requested by: Emmaline Kluver, MD ?[address] ?[place],  [place] [address] ?PCP: Street, Sharon Mt, MD ? ? ?Assessment & Plan: ?Visit Diagnoses:  ?1. Below-knee amputation of left lower extremity ([place])   ? ? ?Plan:  ? ?Gaberiel returns today for wound recheck.  He has no complaints.  He has some serosanguineous drainage on the bandage.  He is not having any problems with the Keflex.  Currently ambulating with a knee scooter. ? ?The wound is healing well.  There is a good bed of granulation tissue.  There is scant serosanguineous drainage. ? ?We will continue with the mupirocin ointment twice a day.  Tramadol refill.  Continue nonweightbearing.  FMLA paperwork filled out today.  Recheck in 2 weeks. ? ?Follow-Up Instructions: Return in about 2 weeks (around 03/27/2022).  ? ?Orders:  ?No orders of the defined types were placed in this encounter. ? ?Meds ordered this encounter  ?Medications  ? traMADol (ULTRAM) 50 MG tablet  ?  Sig: Take 1-2 tablets (50-100 mg total) by mouth every 12 (twelve) hours as needed.  ?  Dispense:  60 tablet  ?  Refill:  2  ? ? ? ? Procedures: ?No procedures performed ? ? ?Clinical Data: ?No additional findings. ? ? ?Subjective: ?Chief Complaint  ?Patient presents with  ? Left Knee - Pain  ? ? ?HPI ? ?Review of Systems ? ? ?Objective: ?Vital Signs: There were no vitals taken for this visit. ? ?Physical Exam ? ?Ortho Exam ? ?Specialty Comments:  ?No specialty comments available. ? ?Imaging: ?No results found. ? ? ?PMFS History: ?Patient Active Problem List  ? Diagnosis Date Noted  ? Below-knee amputation of left lower extremity ([place]) 11/05/2021  ? Acute osteomyelitis of left foot ([place]) 09/03/2021  ? Osteomyelitis of fourth toe of left foot ([place]) 08/13/2021  ? Osteomyelitis of fifth toe of left foot ([place]) 08/13/2021  ? Mixed hyperlipidemia  07/31/2021  ? Paroxysmal A-fib ([place]) 07/30/2021  ? Medication monitoring encounter 07/28/2021  ? Carpal tunnel syndrome on right 07/10/2021  ? Diabetic infection of left foot ([place]) 07/03/2021  ? CKD (chronic kidney disease) stage 5, GFR less than 15 ml/min (HCC) 07/02/2021  ? Renal insufficiency 05/20/2021  ? DM2 (diabetes mellitus, type 2) ([place]) 05/20/2021  ? Acute ischemic stroke ([place]) 05/20/2021  ? HTN (hypertension) 05/20/2021  ? ?Past Medical History:  ?Diagnosis Date  ? A-fib ([place])   ? Anemia   ? low iron  ? Chronic kidney disease   ? COVID   ? has had it 2 times, one mild and one wasn't  ? DM2 (diabetes mellitus, type 2) ([place])   ? History of blood transfusion   ? HTN (hypertension)   ? Osteomyelitis of fifth toe of left foot ([place]) 08/13/2021  ? Osteomyelitis of fourth toe of left foot ([place]) 08/13/2021  ? Pneumonia   ? Stroke ([place]) 05/19/2021  ? no residual effects.  ?  ?Family History  ?Problem Relation Age of Onset  ? Stroke Mother   ? Cancer Mother   ? Heart disease Father   ?  ?Past Surgical History:  ?Procedure Laterality Date  ? AMPUTATION Left 08/29/2021  ? Procedure: AMPUTATION OF FOURTH TOE  AND RAY ALONG WITH REMAINING FITH METATARSAL;  Surgeon: Leandrew Koyanagi, MD;  Location: [facility];  Service: Orthopedics;  Laterality: Left;  ? AMPUTATION Left 09/03/2021  ? Procedure: LISFRANC AMPUTATION;  Surgeon: Leandrew Koyanagi, MD;  Location: [facility];  Service: Orthopedics;  Laterality: Left;  ? AMPUTATION Left 10/29/2021  ? Procedure: AMPUTATION BELOW KNEE -LEFT;  Surgeon: Leandrew Koyanagi, MD;  Location: [facility];  Service: Orthopedics;  Laterality: Left;  ? APPLICATION OF WOUND VAC Left 07/07/2021  ? Procedure: APPLICATION OF WOUND VAC;  Surgeon: Leandrew Koyanagi, MD;  Location: [facility];  Service: Orthopedics;  Laterality: Left;  ? APPLICATION OF WOUND VAC Left 08/29/2021  ? Procedure: APPLICATION OF WOUND VAC;  Surgeon: Leandrew Koyanagi, MD;  Location: [facility];  Service: Orthopedics;  Laterality: Left;  ? I & D EXTREMITY Left  07/03/2021  ? Procedure: IRRIGATION AND DEBRIDEMENT ,FIFTH RAY  AMPUTATION LEFT FOOT, , WOUND VAC PLACEMENT;  Surgeon: Leandrew Koyanagi, MD;  Location: [facility];  Service: Orthopedics;  Laterality: Left;  ? I & D EXTREMITY Left 07/07/2021  ? Procedure: IRRIGATION AND DEBRIDEMENT LEFT FOOT;  Surgeon: Leandrew Koyanagi, MD;  Location: [facility];  Service: Orthopedics;  Laterality: Left;  ? I & D EXTREMITY Left 08/29/2021  ? Procedure: IRRIGATION AND DEBRIDEMENT LEFT FOOT;  Surgeon: Leandrew Koyanagi, MD;  Location: [facility];  Service: Orthopedics;  Laterality: Left;  ? IR FLUORO GUIDE CV LINE RIGHT  07/09/2021  ? IR REMOVAL TUN CV CATH W/O FL  10/08/2021  ? IR US GUIDE VASC ACCESS RIGHT  07/09/2021  ? VITRECTOMY Left   ? [facility]  ? ?Social History  ? ?Occupational History  ? Not on file  ?Tobacco Use  ? Smoking status: Never  ? Smokeless tobacco: Never  ?Vaping Use  ? Vaping Use: Never used  ?Substance and Sexual Activity  ? Alcohol use: Not Currently  ?  Comment: rare  ? Drug use: Never  ? Sexual activity: Not on file  ? ? ? ? ? ? ?

## 2022-03-17 ENCOUNTER — Other Ambulatory Visit: Payer: Self-pay | Admitting: Cardiology

## 2022-03-17 ENCOUNTER — Ambulatory Visit: Payer: BC Managed Care – PPO | Admitting: Orthopaedic Surgery

## 2022-03-17 DIAGNOSIS — I480 Paroxysmal atrial fibrillation: Secondary | ICD-10-CM

## 2022-03-27 ENCOUNTER — Ambulatory Visit (INDEPENDENT_AMBULATORY_CARE_PROVIDER_SITE_OTHER): Payer: BC Managed Care – PPO | Admitting: Orthopaedic Surgery

## 2022-03-27 ENCOUNTER — Other Ambulatory Visit (HOSPITAL_COMMUNITY): Payer: Self-pay | Admitting: *Deleted

## 2022-03-27 ENCOUNTER — Encounter: Payer: Self-pay | Admitting: Orthopaedic Surgery

## 2022-03-27 DIAGNOSIS — Z89512 Acquired absence of left leg below knee: Secondary | ICD-10-CM

## 2022-03-27 DIAGNOSIS — S88112A Complete traumatic amputation at level between knee and ankle, left lower leg, initial encounter: Secondary | ICD-10-CM

## 2022-03-27 MED ORDER — DOXYCYCLINE HYCLATE 100 MG PO TABS: 100.0000 mg | ORAL_TABLET | Freq: Two times a day (BID) | ORAL | 0 refills | Status: AC

## 2022-03-27 NOTE — Progress Notes (Signed)
Office Visit Note   Patient: Bruce Little           Date of Birth: [birth year]           MRN: [id number] Visit Date: 03/27/2022              Requested by: Emmaline Kluver, MD [address] [place],  [place] [address] PCP: Venetia Maxon, Sharon Mt, MD   Assessment & Plan: Visit Diagnoses:  1. Below-knee amputation of left lower extremity ([place])     Plan: Comer returns today for wound check.  Reports no worsening symptoms.  The wound remains open with subcutaneous tissue.  There is some serosanguineous drainage.  No surrounding cellulitis.  No bony tenderness.  At this time I would like to start wet-to-dry dressings instead of mupirocin.  We will place him on a month of doxycycline empirically.  Recheck in 2 weeks.  He should avoid wearing the prosthesis at all if possible.  Follow-Up Instructions: Return in about 2 weeks (around 04/10/2022).   Orders:  No orders of the defined types were placed in this encounter.  Meds ordered this encounter  Medications   doxycycline (VIBRA-TABS) 100 MG tablet    Sig: Take 1 tablet (100 mg total) by mouth 2 (two) times daily.    Dispense:  60 tablet    Refill:  0      Procedures: No procedures performed   Clinical Data: No additional findings.   Subjective: Chief Complaint  Patient presents with   Left Leg - Follow-up    Left below knee amputation 10/29/2021    HPI  Review of Systems   Objective: Vital Signs: There were no vitals taken for this visit.  Physical Exam  Ortho Exam  Specialty Comments:  No specialty comments available.  Imaging: No results found.   PMFS History: Patient Active Problem List   Diagnosis Date Noted   Below-knee amputation of left lower extremity ([place]) 11/05/2021   Acute osteomyelitis of left foot ([place]) 09/03/2021   Osteomyelitis of fourth toe of left foot ([place]) 08/13/2021   Osteomyelitis of fifth toe of left foot ([place]) 08/13/2021   Mixed hyperlipidemia 07/31/2021   Paroxysmal  A-fib ([place]) 07/30/2021   Medication monitoring encounter 07/28/2021   Carpal tunnel syndrome on right 07/10/2021   Diabetic infection of left foot ([place]) 07/03/2021   CKD (chronic kidney disease) stage 5, GFR less than 15 ml/min ([place]) 07/02/2021   Renal insufficiency 05/20/2021   DM2 (diabetes mellitus, type 2) ([place]) 05/20/2021   Acute ischemic stroke ([place]) 05/20/2021   HTN (hypertension) 05/20/2021   Past Medical History:  Diagnosis Date   A-fib ([place])    Anemia    low iron   Chronic kidney disease    COVID    has had it 2 times, one mild and one wasn't   DM2 (diabetes mellitus, type 2) ([place])    History of blood transfusion    HTN (hypertension)    Osteomyelitis of fifth toe of left foot ([place]) 08/13/2021   Osteomyelitis of fourth toe of left foot ([place]) 08/13/2021   Pneumonia    Stroke ([place]) 05/19/2021   no residual effects.    Family History  Problem Relation Age of Onset   Stroke Mother    Cancer Mother    Heart disease Father     Past Surgical History:  Procedure Laterality Date   AMPUTATION Left 08/29/2021   Procedure: AMPUTATION OF FOURTH TOE AND RAY ALONG WITH REMAINING FITH METATARSAL;  Surgeon: Leandrew Koyanagi, MD;  Location: [facility];  Service: Orthopedics;  Laterality: Left;   AMPUTATION Left 09/03/2021   Procedure: LISFRANC AMPUTATION;  Surgeon: Leandrew Koyanagi, MD;  Location: [facility];  Service: Orthopedics;  Laterality: Left;   AMPUTATION Left 10/29/2021   Procedure: AMPUTATION BELOW KNEE -LEFT;  Surgeon: Leandrew Koyanagi, MD;  Location: [facility];  Service: Orthopedics;  Laterality: Left;   APPLICATION OF WOUND VAC Left 07/07/2021   Procedure: APPLICATION OF WOUND VAC;  Surgeon: Leandrew Koyanagi, MD;  Location: [facility];  Service: Orthopedics;  Laterality: Left;   APPLICATION OF WOUND VAC Left 08/29/2021   Procedure: APPLICATION OF WOUND VAC;  Surgeon: Leandrew Koyanagi, MD;  Location: [facility];  Service: Orthopedics;  Laterality: Left;   I & D EXTREMITY Left 07/03/2021   Procedure:  IRRIGATION AND DEBRIDEMENT ,FIFTH RAY  AMPUTATION LEFT FOOT, , WOUND VAC PLACEMENT;  Surgeon: Leandrew Koyanagi, MD;  Location: [facility];  Service: Orthopedics;  Laterality: Left;   I & D EXTREMITY Left 07/07/2021   Procedure: IRRIGATION AND DEBRIDEMENT LEFT FOOT;  Surgeon: Leandrew Koyanagi, MD;  Location: [facility];  Service: Orthopedics;  Laterality: Left;   I & D EXTREMITY Left 08/29/2021   Procedure: IRRIGATION AND DEBRIDEMENT LEFT FOOT;  Surgeon: Leandrew Koyanagi, MD;  Location: [facility];  Service: Orthopedics;  Laterality: Left;   IR FLUORO GUIDE CV LINE RIGHT  07/09/2021   IR REMOVAL TUN CV CATH W/O FL  10/08/2021   IR US GUIDE VASC ACCESS RIGHT  07/09/2021   VITRECTOMY Left    [facility]   Social History   Occupational History   Not on file  Tobacco Use   Smoking status: Never   Smokeless tobacco: Never  Vaping Use   Vaping Use: Never used  Substance and Sexual Activity   Alcohol use: Not Currently    Comment: rare   Drug use: Never   Sexual activity: Not on file

## 2022-03-30 ENCOUNTER — Encounter (HOSPITAL_COMMUNITY): Payer: BC Managed Care – PPO

## 2022-04-10 ENCOUNTER — Ambulatory Visit (INDEPENDENT_AMBULATORY_CARE_PROVIDER_SITE_OTHER): Payer: BC Managed Care – PPO | Admitting: Orthopaedic Surgery

## 2022-04-10 ENCOUNTER — Encounter: Payer: Self-pay | Admitting: Orthopaedic Surgery

## 2022-04-10 DIAGNOSIS — Z89512 Acquired absence of left leg below knee: Secondary | ICD-10-CM

## 2022-04-10 DIAGNOSIS — S88112A Complete traumatic amputation at level between knee and ankle, left lower leg, initial encounter: Secondary | ICD-10-CM

## 2022-04-10 NOTE — Progress Notes (Signed)
Office Visit Note   Patient: Bruce Little           Date of Birth: July 06, 1971           MRN: [id number] Visit Date: 04/10/2022              Requested by: Emmaline Kluver, MD [address] [place],  [place] [address] PCP: Venetia Maxon, Sharon Mt, MD   Assessment & Plan: Visit Diagnoses:  1. Below-knee amputation of left lower extremity ([place])     Plan: Elward returns today for recheck of his wound.  He states that the wound completely healed when he did not wear the prosthesis but he says he wore it the wound opened back up.  He is taking doxycycline.  School is out for the summer.  Sounds like the prosthesis is main problem and so he is going to see [facility] next week to get the socket adjusted.  If the wound persists we will need to look at taking him back to the operating room for I&D and closure.  Recheck in 2 weeks.  Follow-Up Instructions: Return in about 2 weeks (around 04/24/2022).   Orders:  No orders of the defined types were placed in this encounter.  No orders of the defined types were placed in this encounter.     Procedures: No procedures performed   Clinical Data: No additional findings.   Subjective: Chief Complaint  Patient presents with   Left Knee - Pain, Follow-up    HPI  Review of Systems   Objective: Vital Signs: There were no vitals taken for this visit.  Physical Exam  Ortho Exam  Specialty Comments:  No specialty comments available.  Imaging: No results found.   PMFS History: Patient Active Problem List   Diagnosis Date Noted   Below-knee amputation of left lower extremity ([place]) 11/05/2021   Acute osteomyelitis of left foot ([place]) 09/03/2021   Osteomyelitis of fourth toe of left foot ([place]) 08/13/2021   Osteomyelitis of fifth toe of left foot ([place]) 08/13/2021   Mixed hyperlipidemia 07/31/2021   Paroxysmal A-fib ([place]) 07/30/2021   Medication monitoring encounter 07/28/2021   Carpal tunnel syndrome on right 07/10/2021    Diabetic infection of left foot ([place]) 07/03/2021   CKD (chronic kidney disease) stage 5, GFR less than 15 ml/min ([place]) 07/02/2021   Renal insufficiency 05/20/2021   DM2 (diabetes mellitus, type 2) ([place]) 05/20/2021   Acute ischemic stroke ([place]) 05/20/2021   HTN (hypertension) 05/20/2021   Past Medical History:  Diagnosis Date   A-fib ([place])    Anemia    low iron   Chronic kidney disease    COVID    has had it 2 times, one mild and one wasn't   DM2 (diabetes mellitus, type 2) ([place])    History of blood transfusion    HTN (hypertension)    Osteomyelitis of fifth toe of left foot ([place]) 08/13/2021   Osteomyelitis of fourth toe of left foot ([place]) 08/13/2021   Pneumonia    Stroke ([place]) 05/19/2021   no residual effects.    Family History  Problem Relation Age of Onset   Stroke Mother    Cancer Mother    Heart disease Father     Past Surgical History:  Procedure Laterality Date   AMPUTATION Left 08/29/2021   Procedure: AMPUTATION OF FOURTH TOE AND RAY ALONG WITH REMAINING FITH METATARSAL;  Surgeon: Leandrew Koyanagi, MD;  Location: [facility];  Service: Orthopedics;  Laterality: Left;  AMPUTATION Left 09/03/2021   Procedure: LISFRANC AMPUTATION;  Surgeon: Leandrew Koyanagi, MD;  Location: [facility];  Service: Orthopedics;  Laterality: Left;   AMPUTATION Left 10/29/2021   Procedure: AMPUTATION BELOW KNEE -LEFT;  Surgeon: Leandrew Koyanagi, MD;  Location: [facility];  Service: Orthopedics;  Laterality: Left;   APPLICATION OF WOUND VAC Left 07/07/2021   Procedure: APPLICATION OF WOUND VAC;  Surgeon: Leandrew Koyanagi, MD;  Location: [facility];  Service: Orthopedics;  Laterality: Left;   APPLICATION OF WOUND VAC Left 08/29/2021   Procedure: APPLICATION OF WOUND VAC;  Surgeon: Leandrew Koyanagi, MD;  Location: [facility];  Service: Orthopedics;  Laterality: Left;   I & D EXTREMITY Left 07/03/2021   Procedure: IRRIGATION AND DEBRIDEMENT ,FIFTH RAY  AMPUTATION LEFT FOOT, , WOUND VAC PLACEMENT;  Surgeon: Leandrew Koyanagi, MD;  Location:  [facility];  Service: Orthopedics;  Laterality: Left;   I & D EXTREMITY Left 07/07/2021   Procedure: IRRIGATION AND DEBRIDEMENT LEFT FOOT;  Surgeon: Leandrew Koyanagi, MD;  Location: [facility];  Service: Orthopedics;  Laterality: Left;   I & D EXTREMITY Left 08/29/2021   Procedure: IRRIGATION AND DEBRIDEMENT LEFT FOOT;  Surgeon: Leandrew Koyanagi, MD;  Location: [facility];  Service: Orthopedics;  Laterality: Left;   IR FLUORO GUIDE CV LINE RIGHT  07/09/2021   IR REMOVAL TUN CV CATH W/O FL  10/08/2021   IR US GUIDE VASC ACCESS RIGHT  07/09/2021   VITRECTOMY Left    [facility]   Social History   Occupational History   Not on file  Tobacco Use   Smoking status: Never   Smokeless tobacco: Never  Vaping Use   Vaping Use: Never used  Substance and Sexual Activity   Alcohol use: Not Currently    Comment: rare   Drug use: Never   Sexual activity: Not on file

## 2022-04-13 ENCOUNTER — Encounter (HOSPITAL_COMMUNITY): Payer: Self-pay

## 2022-04-13 ENCOUNTER — Inpatient Hospital Stay (HOSPITAL_COMMUNITY): Admission: RE | Admit: 2022-04-13 | Payer: BC Managed Care – PPO | Source: Ambulatory Visit

## 2022-04-14 ENCOUNTER — Encounter: Payer: Self-pay | Admitting: Orthopaedic Surgery

## 2022-04-15 ENCOUNTER — Other Ambulatory Visit: Payer: Self-pay

## 2022-04-15 DIAGNOSIS — S88112A Complete traumatic amputation at level between knee and ankle, left lower leg, initial encounter: Secondary | ICD-10-CM

## 2022-04-27 ENCOUNTER — Encounter (HOSPITAL_COMMUNITY): Payer: BC Managed Care – PPO

## 2022-04-28 ENCOUNTER — Encounter: Payer: Self-pay | Admitting: Orthopaedic Surgery

## 2022-04-28 ENCOUNTER — Ambulatory Visit (INDEPENDENT_AMBULATORY_CARE_PROVIDER_SITE_OTHER): Payer: BC Managed Care – PPO | Admitting: Orthopaedic Surgery

## 2022-04-28 DIAGNOSIS — S88112A Complete traumatic amputation at level between knee and ankle, left lower leg, initial encounter: Secondary | ICD-10-CM

## 2022-04-28 DIAGNOSIS — Z89512 Acquired absence of left leg below knee: Secondary | ICD-10-CM | POA: Diagnosis not present

## 2022-04-28 NOTE — Progress Notes (Unsigned)
Post-Op Visit Note   Patient: Bruce Little           Date of Birth: [birth year]           MRN: [id number] Visit Date: 04/28/2022 PCP: Street, Sharon Mt, MD   Assessment & Plan:  Chief Complaint:  Chief Complaint  Patient presents with   Left Leg - Follow-up    Wound check on BKA   Visit Diagnoses:  1. Below-knee amputation of left lower extremity ([place])     Plan: Patient is a pleasant 51 year old gentleman who comes in today for wound recheck.  He notes that wound care never reached out to him over the past few weeks and has really just been covering his wound with a sock.  He has been seen by [facility] where they are adjusting his socket.  He has 4 days left of his doxycycline.  Overall, he feels as though his wound has improved quite a bit.  Examination of the left BKA shows a small slightly open wound anterior to the BKA stump.  No drainage noted.  No erythema or signs of infection.  At this point, would like for him to finish his antibiotics.  Patient will continue with the manuka honey and will continue to email pictures to [place]  Follow-up with [place] should his symptoms worsen or fail to continue to improve.  Follow-Up Instructions: Return if symptoms worsen or fail to improve.   Orders:  No orders of the defined types were placed in this encounter.  No orders of the defined types were placed in this encounter.   Imaging:   PMFS History: Patient Active Problem List   Diagnosis Date Noted   Below-knee amputation of left lower extremity ([place]) 11/05/2021   Acute osteomyelitis of left foot ([place]) 09/03/2021   Osteomyelitis of fourth toe of left foot ([place]) 08/13/2021   Osteomyelitis of fifth toe of left foot ([place]) 08/13/2021   Mixed hyperlipidemia 07/31/2021   Paroxysmal A-fib ([place]) 07/30/2021   Medication monitoring encounter 07/28/2021   Carpal tunnel syndrome on right 07/10/2021   Diabetic infection of left foot ([place]) 07/03/2021   CKD (chronic kidney disease) stage 5, GFR  less than 15 ml/min ([place]) 07/02/2021   Renal insufficiency 05/20/2021   DM2 (diabetes mellitus, type 2) ([place]) 05/20/2021   Acute ischemic stroke ([place]) 05/20/2021   HTN (hypertension) 05/20/2021   Past Medical History:  Diagnosis Date   A-fib ([place])    Anemia    low iron   Chronic kidney disease    COVID    has had it 2 times, one mild and one wasn't   DM2 (diabetes mellitus, type 2) ([place])    History of blood transfusion    HTN (hypertension)    Osteomyelitis of fifth toe of left foot ([place]) 08/13/2021   Osteomyelitis of fourth toe of left foot ([place]) 08/13/2021   Pneumonia    Stroke ([place]) 05/19/2021   no residual effects.    Family History  Problem Relation Age of Onset   Stroke Mother    Cancer Mother    Heart disease Father     Past Surgical History:  Procedure Laterality Date   AMPUTATION Left 08/29/2021   Procedure: AMPUTATION OF FOURTH TOE AND RAY ALONG WITH REMAINING FITH METATARSAL;  Surgeon: Leandrew Koyanagi, MD;  Location: [facility];  Service: Orthopedics;  Laterality: Left;   AMPUTATION Left 09/03/2021   Procedure: LISFRANC AMPUTATION;  Surgeon: Leandrew Koyanagi, MD;  Location: [facility];  Service:  Orthopedics;  Laterality: Left;   AMPUTATION Left 10/29/2021   Procedure: AMPUTATION BELOW KNEE -LEFT;  Surgeon: Leandrew Koyanagi, MD;  Location: [facility];  Service: Orthopedics;  Laterality: Left;   APPLICATION OF WOUND VAC Left 07/07/2021   Procedure: APPLICATION OF WOUND VAC;  Surgeon: Leandrew Koyanagi, MD;  Location: [facility];  Service: Orthopedics;  Laterality: Left;   APPLICATION OF WOUND VAC Left 08/29/2021   Procedure: APPLICATION OF WOUND VAC;  Surgeon: Leandrew Koyanagi, MD;  Location: [facility];  Service: Orthopedics;  Laterality: Left;   I & D EXTREMITY Left 07/03/2021   Procedure: IRRIGATION AND DEBRIDEMENT ,FIFTH RAY  AMPUTATION LEFT FOOT, , WOUND VAC PLACEMENT;  Surgeon: Leandrew Koyanagi, MD;  Location: [facility];  Service: Orthopedics;  Laterality: Left;   I & D EXTREMITY Left 07/07/2021    Procedure: IRRIGATION AND DEBRIDEMENT LEFT FOOT;  Surgeon: Leandrew Koyanagi, MD;  Location: [facility];  Service: Orthopedics;  Laterality: Left;   I & D EXTREMITY Left 08/29/2021   Procedure: IRRIGATION AND DEBRIDEMENT LEFT FOOT;  Surgeon: Leandrew Koyanagi, MD;  Location: [facility];  Service: Orthopedics;  Laterality: Left;   IR FLUORO GUIDE CV LINE RIGHT  07/09/2021   IR REMOVAL TUN CV CATH W/O FL  10/08/2021   IR US GUIDE VASC ACCESS RIGHT  07/09/2021   VITRECTOMY Left    [facility]   Social History   Occupational History   Not on file  Tobacco Use   Smoking status: Never   Smokeless tobacco: Never  Vaping Use   Vaping Use: Never used  Substance and Sexual Activity   Alcohol use: Not Currently    Comment: rare   Drug use: Never   Sexual activity: Not on file

## 2022-04-29 ENCOUNTER — Other Ambulatory Visit: Payer: Self-pay | Admitting: Orthopaedic Surgery

## 2022-05-08 ENCOUNTER — Ambulatory Visit: Payer: BC Managed Care – PPO | Admitting: Orthopaedic Surgery

## 2022-05-11 ENCOUNTER — Encounter (HOSPITAL_COMMUNITY): Payer: BC Managed Care – PPO

## 2022-06-20 ENCOUNTER — Other Ambulatory Visit: Payer: Self-pay | Admitting: Cardiology

## 2022-06-20 DIAGNOSIS — I480 Paroxysmal atrial fibrillation: Secondary | ICD-10-CM

## 2022-07-13 ENCOUNTER — Other Ambulatory Visit: Payer: Self-pay | Admitting: Cardiology

## 2022-07-13 DIAGNOSIS — I480 Paroxysmal atrial fibrillation: Secondary | ICD-10-CM

## 2022-07-19 ENCOUNTER — Other Ambulatory Visit: Payer: Self-pay | Admitting: Physician Assistant

## 2022-08-14 IMAGING — MR MR FOOT*L* W/O CM
5 series · 40 of 40 positions shown · non-contrast
Comparison: MRI left foot dated August 18, 2021.

CLINICAL DATA: Left foot infection.  Fever.

EXAM:
MRI OF THE LEFT FOOT WITHOUT CONTRAST
TECHNIQUE: Multiplanar, multisequence MR imaging of the left foot was
performed. No intravenous contrast was administered.

[Series 7: T2 fat-sat · axial · 3.0mm · 0.70mm/px · z∈[-112,+36]mm · 9 of 38 slices shown (1 of 2)]
[im 1/38]
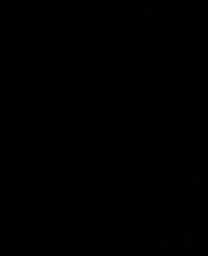
[im 5/38]
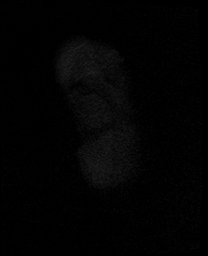
[im 10/38]
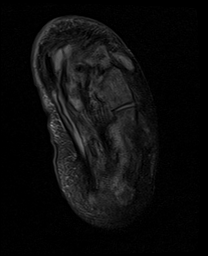
[im 14/38]
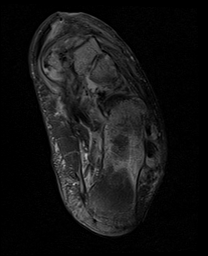
[im 19/38]
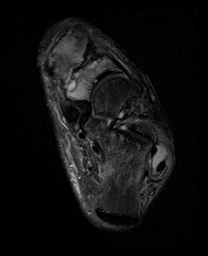
[im 24/38]
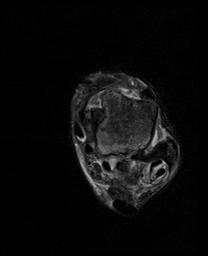
[im 28/38]
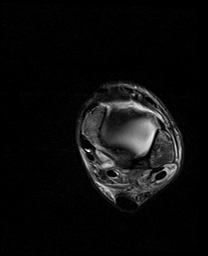
[im 33/38]
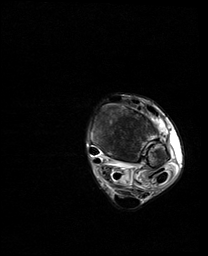
[im 38/38]
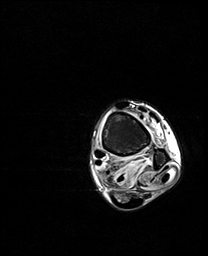

[Series 8: PD fat-sat · axial · 3.0mm · 0.70mm/px · z∈[-112,+36]mm · 8 of 38 slices shown]
[im 1/38]
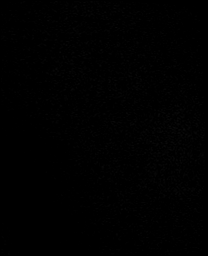
[im 6/38]
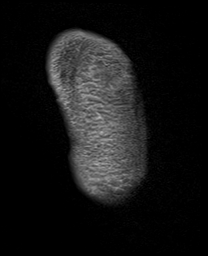
[im 11/38]
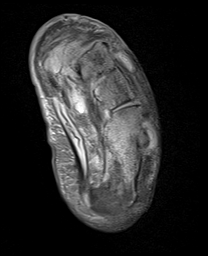
[im 16/38]
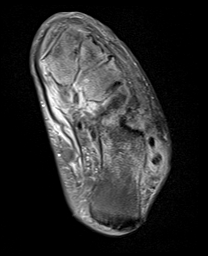
[im 22/38]
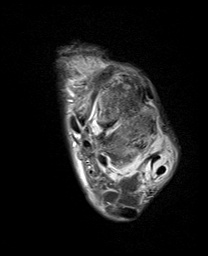
[im 27/38]
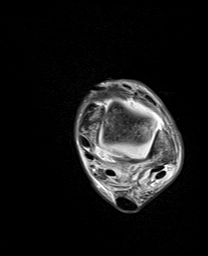
[im 32/38]
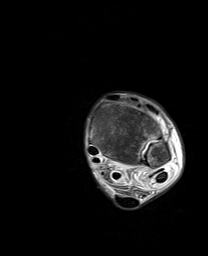
[im 38/38]
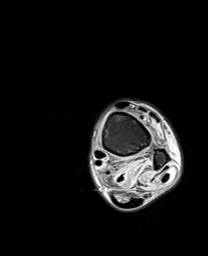

[Series 9: T1 · sagittal · 4.0mm · 0.74mm/px · 6 of 27 slices shown]
[im 1/27]
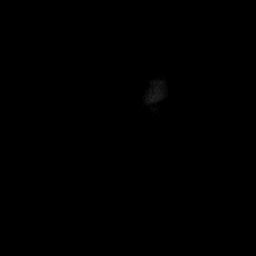
[im 6/27]
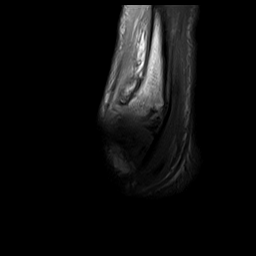
[im 11/27]
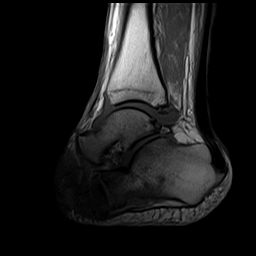
[im 16/27]
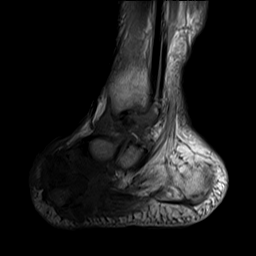
[im 21/27]
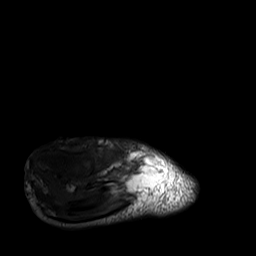
[im 27/27]
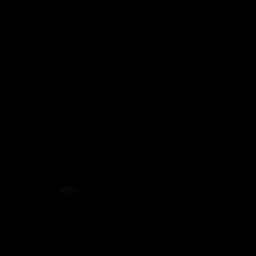

[Series 10: STIR · sagittal · 4.0mm · 0.74mm/px · 6 of 27 slices shown]
[im 1/27]
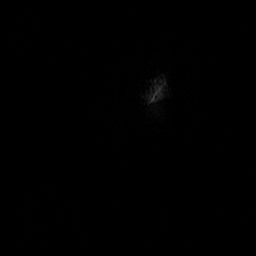
[im 6/27]
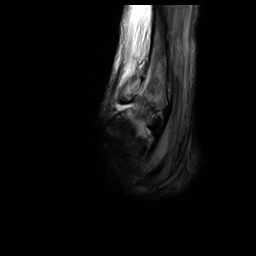
[im 11/27]
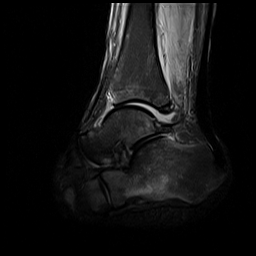
[im 16/27]
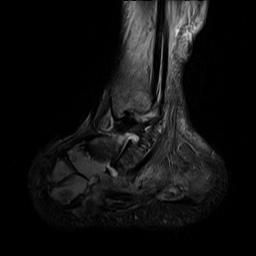
[im 21/27]
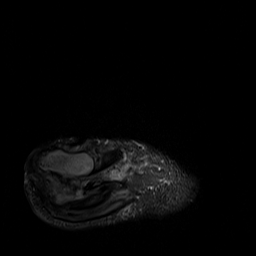
[im 27/27]
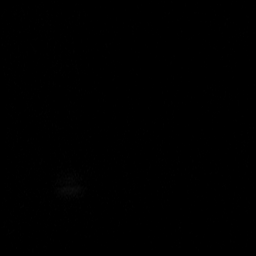

[Series 11: T2 fat-sat · coronal · 3.0mm · 0.59mm/px · 11 of 50 slices shown (2 of 2)]
[im 1/50]
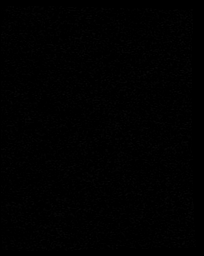
[im 5/50]
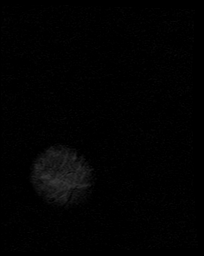
[im 10/50]
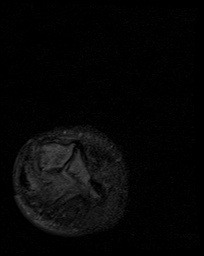
[im 15/50]
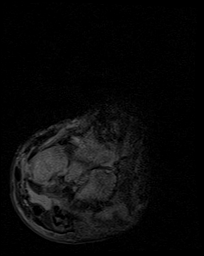
[im 20/50]
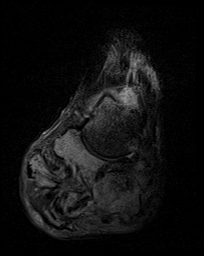
[im 25/50]
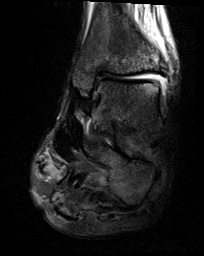
[im 30/50]
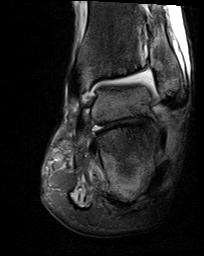
[im 35/50]
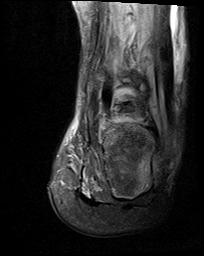
[im 40/50]
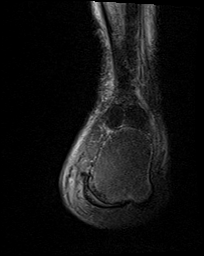
[im 45/50]
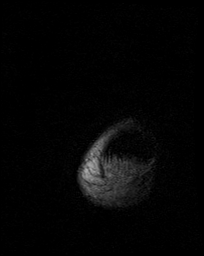
[im 50/50]
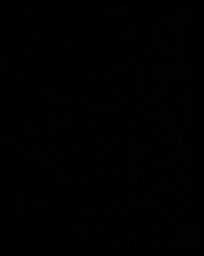

[40 of 40 positions shown; findings below may reference images not displayed]

FINDINGS: Bones/Joint/Cartilage

Interval Lisfranc amputation. New severe confluent marrow edema
involving the cuneiforms, cuboid, navicular, and calcaneus, with
corresponding decreased T1 marrow signal and scattered bony
erosions. Less intense and more patchy marrow edema involving the
talus and distal tibia/fibula. No fracture or dislocation. Small
osteochondral lesion of the lateral talar dome. Small tibiotalar
joint effusion.

Ligaments

Chronically torn anterior talofibular ligament. Remaining lateral
and medial ankle ligaments are intact.

Muscles and Tendons
Post amputation changes. Moderate amount of fluid surrounding the
peroneal tendons.

Soft tissue
5.2 x 3.2 x 1.7 cm fluid collection in the deep midfoot plantar to
the cuneiforms (series 7, image 28). Smaller 1.7 x 0.5 x 1.3 cm
fluid collection lateral to the lateral cuneiform (series 7, image
28). No soft tissue mass.
IMPRESSION: 1. Interval Lisfranc amputation. New extensive osteomyelitis
involving the cuneiforms, navicular, cuboid, and calcaneus.
2. 5.2 cm abscess in the deep midfoot plantar to the cuneiforms.
Smaller 1.7 cm abscess lateral to the lateral cuneiform.
3. Moderate peroneal tenosynovitis, possibly infectious.

These results will be called to the ordering clinician or
representative by the Radiologist Assistant, and communication
documented in the PACS or [REDACTED].

## 2022-10-12 ENCOUNTER — Other Ambulatory Visit: Payer: Self-pay | Admitting: Orthopaedic Surgery

## 2022-11-05 ENCOUNTER — Other Ambulatory Visit: Payer: Self-pay | Admitting: Orthopaedic Surgery

## 2023-01-13 ENCOUNTER — Other Ambulatory Visit: Payer: Self-pay | Admitting: Orthopaedic Surgery

## 2023-04-09 ENCOUNTER — Encounter (HOSPITAL_COMMUNITY): Payer: BC Managed Care – PPO

## 2023-04-10 DIAGNOSIS — I69359 Hemiplegia and hemiparesis following cerebral infarction affecting unspecified side: Secondary | ICD-10-CM

## 2023-04-12 ENCOUNTER — Ambulatory Visit (HOSPITAL_COMMUNITY)
Admission: RE | Admit: 2023-04-12 | Discharge: 2023-04-12 | Disposition: A | Discharge status: Home / Self Care | Payer: BC Managed Care – PPO | Source: Ambulatory Visit | Attending: Internal Medicine | Admitting: Internal Medicine

## 2023-04-12 VITALS — BP 157/87 | HR 80 | Temp 97.0°F | Resp 17

## 2023-04-12 DIAGNOSIS — N185 Chronic kidney disease, stage 5: Secondary | ICD-10-CM | POA: Insufficient documentation

## 2023-04-12 LAB — POCT HEMOGLOBIN-HEMACUE: Hemoglobin: 9.3 g/dL — ABNORMAL LOW (ref 13.0–17.0)

## 2023-04-12 MED ORDER — EPOETIN ALFA-EPBX 10000 UNIT/ML IJ SOLN
INTRAMUSCULAR | Status: AC
  Administered 2023-04-12: 20000 [IU] via SUBCUTANEOUS
  Filled 2023-04-12: qty 2

## 2023-04-12 MED ORDER — EPOETIN ALFA-EPBX 10000 UNIT/ML IJ SOLN: 20000.0000 [IU] | INTRAMUSCULAR | Status: DC

## 2023-04-14 ENCOUNTER — Ambulatory Visit: Payer: BC Managed Care – PPO | Admitting: Vascular Surgery

## 2023-04-14 ENCOUNTER — Encounter: Payer: Self-pay | Admitting: Vascular Surgery

## 2023-04-14 VITALS — BP 147/85 | HR 78 | Temp 98.3°F | Resp 20 | Ht 74.0 in | Wt 245.0 lb

## 2023-04-14 DIAGNOSIS — N185 Chronic kidney disease, stage 5: Secondary | ICD-10-CM

## 2023-04-14 NOTE — Progress Notes (Signed)
Patient ID: RANNY HERTENSTEIN, male   DOB: 07-Aug-1971, 52 y.o.   MRN: [id number]  Reason for Consult: New Patient (Initial Visit)   Referred by Street, Stephanie Coup, *  Subjective:     HPI:  Romualdo Carion Vergara is a 52 y.o. male without previous history of vascular disease.  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  He states that he now feels weak as his kidney function has significantly decreased to less than 10%.  He is here today to discuss dialysis access options.  He denies any history of upper extremity surgeries.  He is right-hand dominant.  He is hopeful peritoneal dialysis and has never had any intra-abdominal surgeries or infections.  Past Medical History:  Diagnosis Date   A-fib (HCC)    Anemia    low iron   Chronic kidney disease    COVID    has had it 2 times, one mild and one wasn't   DM2 (diabetes mellitus, type 2) (HCC)    History of blood transfusion    HTN (hypertension)    Osteomyelitis of fifth toe of left foot (HCC) 08/13/2021   Osteomyelitis of fourth toe of left foot (HCC) 08/13/2021   Pneumonia    Stroke (HCC) 05/19/2021   no residual effects.   Family History  Problem Relation Age of Onset   Stroke Mother    Cancer Mother    Heart disease Father    Past Surgical History:  Procedure Laterality Date   AMPUTATION Left 08/29/2021   Procedure: AMPUTATION OF FOURTH TOE AND RAY ALONG WITH REMAINING FITH METATARSAL;  Surgeon: Tarry Kos, MD;  Location: [facility] OR;  Service: Orthopedics;  Laterality: Left;   AMPUTATION Left 09/03/2021   Procedure: LISFRANC AMPUTATION;  Surgeon: Tarry Kos, MD;  Location: [facility];  Service: Orthopedics;  Laterality: Left;   AMPUTATION Left 10/29/2021   Procedure: AMPUTATION BELOW KNEE -LEFT;  Surgeon: Tarry Kos, MD;  Location: [facility];  Service: Orthopedics;  Laterality: Left;   APPLICATION OF WOUND VAC Left 07/07/2021    Procedure: APPLICATION OF WOUND VAC;  Surgeon: Tarry Kos, MD;  Location: [facility];  Service: Orthopedics;  Laterality: Left;   APPLICATION OF WOUND VAC Left 08/29/2021   Procedure: APPLICATION OF WOUND VAC;  Surgeon: Tarry Kos, MD;  Location: [facility];  Service: Orthopedics;  Laterality: Left;   I & D EXTREMITY Left 07/03/2021   Procedure: IRRIGATION AND DEBRIDEMENT ,FIFTH RAY  AMPUTATION LEFT FOOT, , WOUND VAC PLACEMENT;  Surgeon: Tarry Kos, MD;  Location: [facility];  Service: Orthopedics;  Laterality: Left;   I & D EXTREMITY Left 07/07/2021   Procedure: IRRIGATION AND DEBRIDEMENT LEFT FOOT;  Surgeon: Tarry Kos, MD;  Location: [facility];  Service: Orthopedics;  Laterality: Left;   I & D EXTREMITY Left 08/29/2021   Procedure: IRRIGATION AND DEBRIDEMENT LEFT FOOT;  Surgeon: Tarry Kos, MD;  Location: [facility];  Service: Orthopedics;  Laterality: Left;   IR FLUORO GUIDE CV LINE RIGHT  07/09/2021   IR REMOVAL TUN CV CATH W/O FL  10/08/2021   IR US GUIDE VASC ACCESS RIGHT  07/09/2021   VITRECTOMY Left    [facility]    Short Social History:  Social History   Tobacco Use   Smoking status: Never   Smokeless tobacco: Never  Substance Use Topics   Alcohol use: Not Currently  Comment: rare    No Known Allergies  Current Outpatient Medications  Medication Sig Dispense Refill   amLODipine (NORVASC) 10 MG tablet Take 10 mg by mouth daily.     atorvastatin (LIPITOR) 80 MG tablet Take 1 tablet (80 mg total) by mouth every evening. 90 tablet 3   cefadroxil (DURICEF) 500 MG capsule Take 1 capsule (500 mg total) by mouth 2 (two) times daily. (Patient taking differently: Take 1,000 mg by mouth 2 (two) times daily.) 120 capsule 2   dorzolamide-timolol (COSOPT) 22.3-6.8 MG/ML ophthalmic solution Place 1 drop into the left eye 2 (two) times daily.     ELIQUIS 5 MG TABS tablet TAKE 1 TABLET BY MOUTH TWICE A DAY 180 tablet 1   gabapentin (NEURONTIN) 100 MG capsule Take 1-2 capsules (100-200 mg  total) by mouth 3 (three) times daily as needed. 90 capsule 6   HYDROcodone-acetaminophen (NORCO) 10-325 MG tablet Take 1/2-1 po qhs prn pain 30 tablet 0   hydrOXYzine (ATARAX/VISTARIL) 25 MG tablet Take 25 mg by mouth every 8 (eight) hours as needed for anxiety.     loratadine (CLARITIN) 10 MG tablet Take 10 mg by mouth in the morning.     metoprolol tartrate (LOPRESSOR) 100 MG tablet TAKE 1 TABLET BY MOUTH TWICE A DAY 180 tablet 3   multivitamin (ONE-A-DAY MEN'S) TABS tablet Take 1 tablet by mouth daily.     mupirocin ointment (BACTROBAN) 2 % Apply 1 application topically 2 (two) times daily as needed (wound care).     ondansetron (ZOFRAN) 4 MG tablet Take 1 tablet (4 mg total) by mouth every 8 (eight) hours as needed for nausea or vomiting. 40 tablet 0   oxyCODONE-acetaminophen (PERCOCET) 5-325 MG tablet Take 1 tablet by mouth every 6 (six) hours as needed. 30 tablet 0   pantoprazole (PROTONIX) 40 MG tablet Take 1 tablet (40 mg total) by mouth daily. 30 tablet 0   traMADol (ULTRAM) 50 MG tablet Take 1-2 tablets (50-100 mg total) by mouth every 12 (twelve) hours as needed. 60 tablet 2   VICTOZA 18 MG/3ML SOPN Inject 1.8 mg into the skin in the morning.     glipiZIDE (GLUCOTROL) 5 MG tablet Take 1 tablet (5 mg total) by mouth daily. 30 tablet 0   No current facility-administered medications for this visit.    Review of Systems  Constitutional: Positive for fatigue.  HENT: HENT negative.  Eyes: Eyes negative.  Respiratory: Respiratory negative.  Cardiovascular: Cardiovascular negative.  GI: Gastrointestinal negative.  Musculoskeletal: Musculoskeletal negative.  Skin: Skin negative.  Neurological: Neurological negative. Psychiatric: Psychiatric negative.        Objective:  Objective   Vitals:   04/14/23 1521  BP: (!) 147/85  Pulse: 78  Resp: 20  Temp: 98.3 F (36.8 C)  SpO2: 96%  Weight: 245 lb (111.1 kg)  Height: 6\' 2"  (1.88 m)   Body mass index is 31.46  kg/m.  Physical Exam HENT:     Head: Normocephalic.     Nose: Nose normal.  Eyes:     Pupils: Pupils are equal, round, and reactive to light.  Cardiovascular:     Pulses:          Radial pulses are 2+ on the right side and 2+ on the left side.  Pulmonary:     Effort: Pulmonary effort is normal.  Abdominal:     General: Abdomen is flat.     Palpations: Abdomen is soft.  Musculoskeletal:     Comments: Very large  forearm surface veins bilaterally  Skin:    Capillary Refill: Capillary refill takes less than 2 seconds.  Neurological:     General: No focal deficit present.     Mental Status: He is alert.  Psychiatric:        Mood and Affect: Mood normal.        Thought Content: Thought content normal.        Judgment: Judgment normal.     Data: No studies today     Assessment/Plan:    52 year old male here for discussion of dialysis access now with CKD 5.  Patient hopeful to proceed with peritoneal dialysis catheter.  We discussed the risk benefits and alternatives.  Will plan for laparoscopic continuous ambulatory PD catheter placement in the near future which we discussed could be used following day for dialysis if necessary.  I discussed the importance of pursuing transplant for this young patient.  If hemodialysis were necessary he does have palpable radial pulses bilaterally and very lower surface veins and could have radiocephalic fistula creation in his nondominant left upper extremity if necessary.  For now we will plan for PD catheter.     Maeola Harman MD [facility]

## 2023-04-16 ENCOUNTER — Telehealth: Payer: Self-pay

## 2023-04-16 NOTE — Telephone Encounter (Signed)
Spoke with patient's wife, [name] to schedule PD catheter surgery with Dr. Randie Heinz. Requested our office call back on next week because they were about to leave.

## 2023-04-19 ENCOUNTER — Other Ambulatory Visit: Payer: Self-pay

## 2023-04-19 DIAGNOSIS — N185 Chronic kidney disease, stage 5: Secondary | ICD-10-CM

## 2023-04-19 NOTE — Telephone Encounter (Signed)
Spoke with patient's wife and scheduled PD catheter placement for 7/18. Instructions reviewed, including to hold Eliquis 3 days prior. She verbalized understanding.

## 2023-04-30 DIAGNOSIS — I619 Nontraumatic intracerebral hemorrhage, unspecified: Secondary | ICD-10-CM

## 2023-04-30 HISTORY — DX: Nontraumatic intracerebral hemorrhage, unspecified: I61.9

## 2023-05-03 ENCOUNTER — Telehealth: Payer: Self-pay

## 2023-05-03 NOTE — Telephone Encounter (Signed)
Spoke with patient's wife who is requested to cancel PD catheter insertion on 7/18 due to patient is currently admitted to [facility] in [place] from having a stroke this weekend. Reports a catheter was placed in his chest because his poor kidney function. She will communicate with office once back in [place], regarding future plans.

## 2023-05-10 ENCOUNTER — Encounter (HOSPITAL_COMMUNITY): Payer: BC Managed Care – PPO

## 2023-05-10 DIAGNOSIS — L89159 Pressure ulcer of sacral region, unspecified stage: Secondary | ICD-10-CM

## 2023-05-10 HISTORY — DX: Pressure ulcer of sacral region, unspecified stage: L89.159

## 2023-05-27 ENCOUNTER — Ambulatory Visit: Admit: 2023-05-27 | Payer: BC Managed Care – PPO | Admitting: Vascular Surgery

## 2023-05-27 SURGERY — LAPAROSCOPIC INSERTION CONTINUOUS AMBULATORY PERITONEAL DIALYSIS  (CAPD) CATHETER
Anesthesia: Choice

## 2023-06-07 ENCOUNTER — Encounter (HOSPITAL_COMMUNITY): Payer: BC Managed Care – PPO

## 2023-06-17 ENCOUNTER — Other Ambulatory Visit: Payer: Self-pay | Admitting: Cardiology

## 2023-06-17 DIAGNOSIS — I480 Paroxysmal atrial fibrillation: Secondary | ICD-10-CM

## 2023-07-01 ENCOUNTER — Inpatient Hospital Stay
Admit: 2023-07-01 | Discharge: 2023-08-10 | Disposition: A | Discharge status: Rehabilitation Facility | Payer: BC Managed Care – PPO | Source: Other Acute Inpatient Hospital

## 2023-07-02 ENCOUNTER — Other Ambulatory Visit (HOSPITAL_COMMUNITY): Payer: BC Managed Care – PPO

## 2023-07-02 DIAGNOSIS — J9621 Acute and chronic respiratory failure with hypoxia: Secondary | ICD-10-CM | POA: Diagnosis not present

## 2023-07-02 DIAGNOSIS — Z930 Tracheostomy status: Secondary | ICD-10-CM | POA: Diagnosis not present

## 2023-07-02 DIAGNOSIS — N186 End stage renal disease: Secondary | ICD-10-CM | POA: Diagnosis not present

## 2023-07-02 DIAGNOSIS — I629 Nontraumatic intracranial hemorrhage, unspecified: Secondary | ICD-10-CM | POA: Diagnosis not present

## 2023-07-02 LAB — CBC WITH DIFFERENTIAL/PLATELET
Abs Immature Granulocytes: 0.08 10*3/uL — ABNORMAL HIGH (ref 0.00–0.07)
Basophils Absolute: 0.1 10*3/uL (ref 0.0–0.1)
Basophils Relative: 0 %
Eosinophils Absolute: 0.1 10*3/uL (ref 0.0–0.5)
Eosinophils Relative: 1 %
HCT: 30.4 % — ABNORMAL LOW (ref 39.0–52.0)
Hemoglobin: 9.7 g/dL — ABNORMAL LOW (ref 13.0–17.0)
Immature Granulocytes: 1 %
Lymphocytes Relative: 12 %
Lymphs Abs: 1.4 10*3/uL (ref 0.7–4.0)
MCH: 28.2 pg (ref 26.0–34.0)
MCHC: 31.9 g/dL (ref 30.0–36.0)
MCV: 88.4 fL (ref 80.0–100.0)
Monocytes Absolute: 1.6 10*3/uL — ABNORMAL HIGH (ref 0.1–1.0)
Monocytes Relative: 15 %
Neutro Abs: 7.9 10*3/uL — ABNORMAL HIGH (ref 1.7–7.7)
Neutrophils Relative %: 71 %
Platelets: 261 10*3/uL (ref 150–400)
RBC: 3.44 MIL/uL — ABNORMAL LOW (ref 4.22–5.81)
RDW: 16.3 % — ABNORMAL HIGH (ref 11.5–15.5)
WBC: 11.2 10*3/uL — ABNORMAL HIGH (ref 4.0–10.5)
nRBC: 0 % (ref 0.0–0.2)

## 2023-07-02 LAB — HEPATITIS B CORE ANTIBODY, TOTAL: Hep B Core Total Ab: NONREACTIVE

## 2023-07-02 LAB — COMPREHENSIVE METABOLIC PANEL
ALT: 37 U/L (ref 0–44)
AST: 31 U/L (ref 15–41)
Albumin: 1.9 g/dL — ABNORMAL LOW (ref 3.5–5.0)
Alkaline Phosphatase: 124 U/L (ref 38–126)
Anion gap: 14 (ref 5–15)
BUN: 48 mg/dL — ABNORMAL HIGH (ref 6–20)
CO2: 23 mmol/L (ref 22–32)
Calcium: 8.5 mg/dL — ABNORMAL LOW (ref 8.9–10.3)
Chloride: 99 mmol/L (ref 98–111)
Creatinine, Ser: 3.4 mg/dL — ABNORMAL HIGH (ref 0.61–1.24)
GFR, Estimated: 21 mL/min — ABNORMAL LOW (ref >60)
Glucose, Bld: 92 mg/dL (ref 70–99)
Potassium: 3.1 mmol/L — ABNORMAL LOW (ref 3.5–5.1)
Sodium: 136 mmol/L (ref 135–145)
Total Bilirubin: 0.5 mg/dL (ref 0.3–1.2)
Total Protein: 6.5 g/dL (ref 6.5–8.1)

## 2023-07-02 LAB — C DIFFICILE QUICK SCREEN W PCR REFLEX
C Diff antigen: NEGATIVE
C Diff interpretation: NOT DETECTED
C Diff toxin: NEGATIVE

## 2023-07-02 LAB — PHOSPHORUS: Phosphorus: 4.2 mg/dL (ref 2.5–4.6)

## 2023-07-02 LAB — HEPATITIS B SURFACE ANTIGEN: Hepatitis B Surface Ag: NONREACTIVE

## 2023-07-02 LAB — POTASSIUM: Potassium: 4.6 mmol/L (ref 3.5–5.1)

## 2023-07-02 LAB — MAGNESIUM: Magnesium: 2.2 mg/dL (ref 1.7–2.4)

## 2023-07-03 ENCOUNTER — Other Ambulatory Visit (HOSPITAL_COMMUNITY): Payer: BC Managed Care – PPO

## 2023-07-03 DIAGNOSIS — J9621 Acute and chronic respiratory failure with hypoxia: Secondary | ICD-10-CM | POA: Diagnosis not present

## 2023-07-03 DIAGNOSIS — I629 Nontraumatic intracranial hemorrhage, unspecified: Secondary | ICD-10-CM | POA: Diagnosis not present

## 2023-07-03 DIAGNOSIS — Z930 Tracheostomy status: Secondary | ICD-10-CM

## 2023-07-03 DIAGNOSIS — N186 End stage renal disease: Secondary | ICD-10-CM | POA: Diagnosis not present

## 2023-07-03 LAB — CBC WITH DIFFERENTIAL/PLATELET
Abs Immature Granulocytes: 0.08 10*3/uL — ABNORMAL HIGH (ref 0.00–0.07)
Basophils Absolute: 0 10*3/uL (ref 0.0–0.1)
Basophils Relative: 0 %
Eosinophils Absolute: 0.1 10*3/uL (ref 0.0–0.5)
Eosinophils Relative: 1 %
HCT: 30.4 % — ABNORMAL LOW (ref 39.0–52.0)
Hemoglobin: 9.6 g/dL — ABNORMAL LOW (ref 13.0–17.0)
Immature Granulocytes: 1 %
Lymphocytes Relative: 9 %
Lymphs Abs: 1.3 10*3/uL (ref 0.7–4.0)
MCH: 28.6 pg (ref 26.0–34.0)
MCHC: 31.6 g/dL (ref 30.0–36.0)
MCV: 90.5 fL (ref 80.0–100.0)
Monocytes Absolute: 1.3 10*3/uL — ABNORMAL HIGH (ref 0.1–1.0)
Monocytes Relative: 9 %
Neutro Abs: 10.9 10*3/uL — ABNORMAL HIGH (ref 1.7–7.7)
Neutrophils Relative %: 80 %
Platelets: 269 10*3/uL (ref 150–400)
RBC: 3.36 MIL/uL — ABNORMAL LOW (ref 4.22–5.81)
RDW: 16.5 % — ABNORMAL HIGH (ref 11.5–15.5)
WBC: 13.6 10*3/uL — ABNORMAL HIGH (ref 4.0–10.5)
nRBC: 0 % (ref 0.0–0.2)

## 2023-07-03 LAB — EXPECTORATED SPUTUM ASSESSMENT W GRAM STAIN, RFLX TO RESP C

## 2023-07-03 LAB — BASIC METABOLIC PANEL
Anion gap: 15 (ref 5–15)
BUN: 29 mg/dL — ABNORMAL HIGH (ref 6–20)
CO2: 25 mmol/L (ref 22–32)
Calcium: 8.5 mg/dL — ABNORMAL LOW (ref 8.9–10.3)
Chloride: 102 mmol/L (ref 98–111)
Creatinine, Ser: 2.44 mg/dL — ABNORMAL HIGH (ref 0.61–1.24)
GFR, Estimated: 31 mL/min — ABNORMAL LOW (ref >60)
Glucose, Bld: 200 mg/dL — ABNORMAL HIGH (ref 70–99)
Potassium: 4 mmol/L (ref 3.5–5.1)
Sodium: 142 mmol/L (ref 135–145)

## 2023-07-03 LAB — PHOSPHORUS: Phosphorus: 2.4 mg/dL — ABNORMAL LOW (ref 2.5–4.6)

## 2023-07-03 LAB — MAGNESIUM: Magnesium: 2 mg/dL (ref 1.7–2.4)

## 2023-07-04 DIAGNOSIS — I629 Nontraumatic intracranial hemorrhage, unspecified: Secondary | ICD-10-CM | POA: Diagnosis not present

## 2023-07-04 DIAGNOSIS — N186 End stage renal disease: Secondary | ICD-10-CM | POA: Diagnosis not present

## 2023-07-04 DIAGNOSIS — Z930 Tracheostomy status: Secondary | ICD-10-CM | POA: Diagnosis not present

## 2023-07-04 DIAGNOSIS — J9621 Acute and chronic respiratory failure with hypoxia: Secondary | ICD-10-CM | POA: Diagnosis not present

## 2023-07-05 DIAGNOSIS — J9621 Acute and chronic respiratory failure with hypoxia: Secondary | ICD-10-CM

## 2023-07-05 DIAGNOSIS — I629 Nontraumatic intracranial hemorrhage, unspecified: Secondary | ICD-10-CM | POA: Diagnosis not present

## 2023-07-05 DIAGNOSIS — Z930 Tracheostomy status: Secondary | ICD-10-CM

## 2023-07-05 DIAGNOSIS — N186 End stage renal disease: Secondary | ICD-10-CM | POA: Diagnosis not present

## 2023-07-05 LAB — CBC WITH DIFFERENTIAL/PLATELET
Abs Immature Granulocytes: 0.15 10*3/uL — ABNORMAL HIGH (ref 0.00–0.07)
Basophils Absolute: 0.1 10*3/uL (ref 0.0–0.1)
Basophils Relative: 0 %
Eosinophils Absolute: 0.3 10*3/uL (ref 0.0–0.5)
Eosinophils Relative: 2 %
HCT: 29.4 % — ABNORMAL LOW (ref 39.0–52.0)
Hemoglobin: 9.1 g/dL — ABNORMAL LOW (ref 13.0–17.0)
Immature Granulocytes: 1 %
Lymphocytes Relative: 11 %
Lymphs Abs: 1.7 10*3/uL (ref 0.7–4.0)
MCH: 28.3 pg (ref 26.0–34.0)
MCHC: 31 g/dL (ref 30.0–36.0)
MCV: 91.6 fL (ref 80.0–100.0)
Monocytes Absolute: 1 10*3/uL (ref 0.1–1.0)
Monocytes Relative: 6 %
Neutro Abs: 12.9 10*3/uL — ABNORMAL HIGH (ref 1.7–7.7)
Neutrophils Relative %: 80 %
Platelets: 280 10*3/uL (ref 150–400)
RBC: 3.21 MIL/uL — ABNORMAL LOW (ref 4.22–5.81)
RDW: 16 % — ABNORMAL HIGH (ref 11.5–15.5)
WBC: 16.1 10*3/uL — ABNORMAL HIGH (ref 4.0–10.5)
nRBC: 0 % (ref 0.0–0.2)

## 2023-07-05 LAB — RENAL FUNCTION PANEL
Albumin: 1.8 g/dL — ABNORMAL LOW (ref 3.5–5.0)
Anion gap: 14 (ref 5–15)
BUN: 56 mg/dL — ABNORMAL HIGH (ref 6–20)
CO2: 23 mmol/L (ref 22–32)
Calcium: 8.5 mg/dL — ABNORMAL LOW (ref 8.9–10.3)
Chloride: 104 mmol/L (ref 98–111)
Creatinine, Ser: 3.56 mg/dL — ABNORMAL HIGH (ref 0.61–1.24)
GFR, Estimated: 20 mL/min — ABNORMAL LOW (ref >60)
Glucose, Bld: 138 mg/dL — ABNORMAL HIGH (ref 70–99)
Phosphorus: 3.4 mg/dL (ref 2.5–4.6)
Potassium: 4.3 mmol/L (ref 3.5–5.1)
Sodium: 141 mmol/L (ref 135–145)

## 2023-07-06 DIAGNOSIS — N186 End stage renal disease: Secondary | ICD-10-CM

## 2023-07-06 DIAGNOSIS — J9621 Acute and chronic respiratory failure with hypoxia: Secondary | ICD-10-CM

## 2023-07-06 DIAGNOSIS — I629 Nontraumatic intracranial hemorrhage, unspecified: Secondary | ICD-10-CM

## 2023-07-06 DIAGNOSIS — Z930 Tracheostomy status: Secondary | ICD-10-CM | POA: Diagnosis not present

## 2023-07-06 LAB — HEPATITIS B SURFACE ANTIBODY, QUANTITATIVE: Hep B S AB Quant (Post): 32.4 m[IU]/mL

## 2023-07-07 DIAGNOSIS — J9621 Acute and chronic respiratory failure with hypoxia: Secondary | ICD-10-CM | POA: Diagnosis not present

## 2023-07-07 DIAGNOSIS — I629 Nontraumatic intracranial hemorrhage, unspecified: Secondary | ICD-10-CM | POA: Diagnosis not present

## 2023-07-07 DIAGNOSIS — N186 End stage renal disease: Secondary | ICD-10-CM | POA: Diagnosis not present

## 2023-07-07 DIAGNOSIS — Z930 Tracheostomy status: Secondary | ICD-10-CM | POA: Diagnosis not present

## 2023-07-07 LAB — CBC WITH DIFFERENTIAL/PLATELET
Abs Immature Granulocytes: 0.32 10*3/uL — ABNORMAL HIGH (ref 0.00–0.07)
Basophils Absolute: 0 10*3/uL (ref 0.0–0.1)
Basophils Relative: 0 %
Eosinophils Absolute: 0.4 10*3/uL (ref 0.0–0.5)
Eosinophils Relative: 3 %
HCT: 30.7 % — ABNORMAL LOW (ref 39.0–52.0)
Hemoglobin: 9.7 g/dL — ABNORMAL LOW (ref 13.0–17.0)
Immature Granulocytes: 2 %
Lymphocytes Relative: 12 %
Lymphs Abs: 1.7 10*3/uL (ref 0.7–4.0)
MCH: 28.3 pg (ref 26.0–34.0)
MCHC: 31.6 g/dL (ref 30.0–36.0)
MCV: 89.5 fL (ref 80.0–100.0)
Monocytes Absolute: 1.1 10*3/uL — ABNORMAL HIGH (ref 0.1–1.0)
Monocytes Relative: 8 %
Neutro Abs: 9.9 10*3/uL — ABNORMAL HIGH (ref 1.7–7.7)
Neutrophils Relative %: 75 %
Platelets: 277 10*3/uL (ref 150–400)
RBC: 3.43 MIL/uL — ABNORMAL LOW (ref 4.22–5.81)
RDW: 15.4 % (ref 11.5–15.5)
WBC: 13.4 10*3/uL — ABNORMAL HIGH (ref 4.0–10.5)
nRBC: 0 % (ref 0.0–0.2)

## 2023-07-07 LAB — RENAL FUNCTION PANEL
Albumin: 1.8 g/dL — ABNORMAL LOW (ref 3.5–5.0)
Anion gap: 14 (ref 5–15)
BUN: 39 mg/dL — ABNORMAL HIGH (ref 6–20)
CO2: 24 mmol/L (ref 22–32)
Calcium: 8.3 mg/dL — ABNORMAL LOW (ref 8.9–10.3)
Chloride: 98 mmol/L (ref 98–111)
Creatinine, Ser: 2.59 mg/dL — ABNORMAL HIGH (ref 0.61–1.24)
GFR, Estimated: 29 mL/min — ABNORMAL LOW (ref >60)
Glucose, Bld: 169 mg/dL — ABNORMAL HIGH (ref 70–99)
Phosphorus: 3.2 mg/dL (ref 2.5–4.6)
Potassium: 4.4 mmol/L (ref 3.5–5.1)
Sodium: 136 mmol/L (ref 135–145)

## 2023-07-08 DIAGNOSIS — I629 Nontraumatic intracranial hemorrhage, unspecified: Secondary | ICD-10-CM | POA: Diagnosis not present

## 2023-07-08 DIAGNOSIS — J9621 Acute and chronic respiratory failure with hypoxia: Secondary | ICD-10-CM

## 2023-07-08 DIAGNOSIS — Z930 Tracheostomy status: Secondary | ICD-10-CM | POA: Diagnosis not present

## 2023-07-08 DIAGNOSIS — N186 End stage renal disease: Secondary | ICD-10-CM

## 2023-07-09 DIAGNOSIS — N186 End stage renal disease: Secondary | ICD-10-CM | POA: Diagnosis not present

## 2023-07-09 DIAGNOSIS — J9621 Acute and chronic respiratory failure with hypoxia: Secondary | ICD-10-CM | POA: Diagnosis not present

## 2023-07-09 DIAGNOSIS — I629 Nontraumatic intracranial hemorrhage, unspecified: Secondary | ICD-10-CM

## 2023-07-09 DIAGNOSIS — Z930 Tracheostomy status: Secondary | ICD-10-CM | POA: Diagnosis not present

## 2023-07-09 LAB — CBC WITH DIFFERENTIAL/PLATELET
Abs Immature Granulocytes: 0.39 10*3/uL — ABNORMAL HIGH (ref 0.00–0.07)
Basophils Absolute: 0.1 10*3/uL (ref 0.0–0.1)
Basophils Relative: 0 %
Eosinophils Absolute: 0.3 10*3/uL (ref 0.0–0.5)
Eosinophils Relative: 2 %
HCT: 29.4 % — ABNORMAL LOW (ref 39.0–52.0)
Hemoglobin: 9.6 g/dL — ABNORMAL LOW (ref 13.0–17.0)
Immature Granulocytes: 3 %
Lymphocytes Relative: 13 %
Lymphs Abs: 1.7 10*3/uL (ref 0.7–4.0)
MCH: 28.6 pg (ref 26.0–34.0)
MCHC: 32.7 g/dL (ref 30.0–36.0)
MCV: 87.5 fL (ref 80.0–100.0)
Monocytes Absolute: 1.1 10*3/uL — ABNORMAL HIGH (ref 0.1–1.0)
Monocytes Relative: 8 %
Neutro Abs: 9.5 10*3/uL — ABNORMAL HIGH (ref 1.7–7.7)
Neutrophils Relative %: 74 %
Platelets: 296 10*3/uL (ref 150–400)
RBC: 3.36 MIL/uL — ABNORMAL LOW (ref 4.22–5.81)
RDW: 15 % (ref 11.5–15.5)
WBC: 13 10*3/uL — ABNORMAL HIGH (ref 4.0–10.5)
nRBC: 0 % (ref 0.0–0.2)

## 2023-07-09 LAB — RENAL FUNCTION PANEL
Albumin: 1.7 g/dL — ABNORMAL LOW (ref 3.5–5.0)
Anion gap: 10 (ref 5–15)
BUN: 33 mg/dL — ABNORMAL HIGH (ref 6–20)
CO2: 24 mmol/L (ref 22–32)
Calcium: 8.3 mg/dL — ABNORMAL LOW (ref 8.9–10.3)
Chloride: 100 mmol/L (ref 98–111)
Creatinine, Ser: 2.77 mg/dL — ABNORMAL HIGH (ref 0.61–1.24)
GFR, Estimated: 27 mL/min — ABNORMAL LOW (ref >60)
Glucose, Bld: 135 mg/dL — ABNORMAL HIGH (ref 70–99)
Phosphorus: 3.5 mg/dL (ref 2.5–4.6)
Potassium: 4.4 mmol/L (ref 3.5–5.1)
Sodium: 134 mmol/L — ABNORMAL LOW (ref 135–145)

## 2023-07-09 LAB — CULTURE, RESPIRATORY W GRAM STAIN

## 2023-07-10 DIAGNOSIS — N186 End stage renal disease: Secondary | ICD-10-CM

## 2023-07-10 DIAGNOSIS — J9621 Acute and chronic respiratory failure with hypoxia: Secondary | ICD-10-CM | POA: Diagnosis not present

## 2023-07-10 DIAGNOSIS — I629 Nontraumatic intracranial hemorrhage, unspecified: Secondary | ICD-10-CM | POA: Diagnosis not present

## 2023-07-10 DIAGNOSIS — Z930 Tracheostomy status: Secondary | ICD-10-CM | POA: Diagnosis not present

## 2023-07-11 DIAGNOSIS — J9621 Acute and chronic respiratory failure with hypoxia: Secondary | ICD-10-CM | POA: Diagnosis not present

## 2023-07-11 DIAGNOSIS — I629 Nontraumatic intracranial hemorrhage, unspecified: Secondary | ICD-10-CM | POA: Diagnosis not present

## 2023-07-11 DIAGNOSIS — Z930 Tracheostomy status: Secondary | ICD-10-CM | POA: Diagnosis not present

## 2023-07-11 DIAGNOSIS — N186 End stage renal disease: Secondary | ICD-10-CM | POA: Diagnosis not present

## 2023-07-12 DIAGNOSIS — I629 Nontraumatic intracranial hemorrhage, unspecified: Secondary | ICD-10-CM | POA: Diagnosis not present

## 2023-07-12 DIAGNOSIS — N186 End stage renal disease: Secondary | ICD-10-CM | POA: Diagnosis not present

## 2023-07-12 DIAGNOSIS — Z930 Tracheostomy status: Secondary | ICD-10-CM | POA: Diagnosis not present

## 2023-07-12 DIAGNOSIS — J9621 Acute and chronic respiratory failure with hypoxia: Secondary | ICD-10-CM | POA: Diagnosis not present

## 2023-07-12 LAB — CBC WITH DIFFERENTIAL/PLATELET
Abs Immature Granulocytes: 0.35 10*3/uL — ABNORMAL HIGH (ref 0.00–0.07)
Basophils Absolute: 0.1 10*3/uL (ref 0.0–0.1)
Basophils Relative: 0 %
Eosinophils Absolute: 0.3 10*3/uL (ref 0.0–0.5)
Eosinophils Relative: 2 %
HCT: 30.9 % — ABNORMAL LOW (ref 39.0–52.0)
Hemoglobin: 9.9 g/dL — ABNORMAL LOW (ref 13.0–17.0)
Immature Granulocytes: 3 %
Lymphocytes Relative: 14 %
Lymphs Abs: 2 10*3/uL (ref 0.7–4.0)
MCH: 28.4 pg (ref 26.0–34.0)
MCHC: 32 g/dL (ref 30.0–36.0)
MCV: 88.5 fL (ref 80.0–100.0)
Monocytes Absolute: 1.2 10*3/uL — ABNORMAL HIGH (ref 0.1–1.0)
Monocytes Relative: 8 %
Neutro Abs: 10.1 10*3/uL — ABNORMAL HIGH (ref 1.7–7.7)
Neutrophils Relative %: 73 %
Platelets: 319 10*3/uL (ref 150–400)
RBC: 3.49 MIL/uL — ABNORMAL LOW (ref 4.22–5.81)
RDW: 14.9 % (ref 11.5–15.5)
WBC: 13.9 10*3/uL — ABNORMAL HIGH (ref 4.0–10.5)
nRBC: 0 % (ref 0.0–0.2)

## 2023-07-12 LAB — RENAL FUNCTION PANEL
Albumin: 1.8 g/dL — ABNORMAL LOW (ref 3.5–5.0)
Anion gap: 10 (ref 5–15)
BUN: 46 mg/dL — ABNORMAL HIGH (ref 6–20)
CO2: 24 mmol/L (ref 22–32)
Calcium: 8.7 mg/dL — ABNORMAL LOW (ref 8.9–10.3)
Chloride: 97 mmol/L — ABNORMAL LOW (ref 98–111)
Creatinine, Ser: 3.49 mg/dL — ABNORMAL HIGH (ref 0.61–1.24)
GFR, Estimated: 20 mL/min — ABNORMAL LOW (ref >60)
Glucose, Bld: 128 mg/dL — ABNORMAL HIGH (ref 70–99)
Phosphorus: 4.4 mg/dL (ref 2.5–4.6)
Potassium: 5.7 mmol/L — ABNORMAL HIGH (ref 3.5–5.1)
Sodium: 131 mmol/L — ABNORMAL LOW (ref 135–145)

## 2023-07-13 ENCOUNTER — Other Ambulatory Visit (HOSPITAL_COMMUNITY): Payer: BC Managed Care – PPO

## 2023-07-13 DIAGNOSIS — I629 Nontraumatic intracranial hemorrhage, unspecified: Secondary | ICD-10-CM | POA: Diagnosis not present

## 2023-07-13 DIAGNOSIS — J9621 Acute and chronic respiratory failure with hypoxia: Secondary | ICD-10-CM | POA: Diagnosis not present

## 2023-07-13 DIAGNOSIS — N186 End stage renal disease: Secondary | ICD-10-CM | POA: Diagnosis not present

## 2023-07-13 DIAGNOSIS — Z930 Tracheostomy status: Secondary | ICD-10-CM | POA: Diagnosis not present

## 2023-07-13 HISTORY — PX: IR REMOVAL TUN CV CATH W/O FL: IMG2289

## 2023-07-13 MED ORDER — LIDOCAINE HCL 1 % IJ SOLN
INTRAMUSCULAR | Status: AC
  Filled 2023-07-13: qty 20

## 2023-07-13 NOTE — Procedures (Addendum)
PROCEDURE SUMMARY:  Successful removal of tunneled central venous catheter.  No complications. EBL = trace   Dressing placed, advised patient   - no shower for 24 hours  - no submerging x 7 days  - keep the site clean and dry, check site daily for signs of infection such as redness of skin and pus-like drainage.   Patient's wife verbalized understanding and gave consent.  Safe to be returned to [facility].    Please see full dictation in imaging section of Epic for procedure details.   Kennieth Francois PA-C 07/13/2023 12:24 PM

## 2023-07-14 DIAGNOSIS — N186 End stage renal disease: Secondary | ICD-10-CM

## 2023-07-14 DIAGNOSIS — I629 Nontraumatic intracranial hemorrhage, unspecified: Secondary | ICD-10-CM

## 2023-07-14 DIAGNOSIS — Z930 Tracheostomy status: Secondary | ICD-10-CM | POA: Diagnosis not present

## 2023-07-14 DIAGNOSIS — J9621 Acute and chronic respiratory failure with hypoxia: Secondary | ICD-10-CM

## 2023-07-14 LAB — CBC WITH DIFFERENTIAL/PLATELET
Abs Immature Granulocytes: 0.23 10*3/uL — ABNORMAL HIGH (ref 0.00–0.07)
Basophils Absolute: 0.1 10*3/uL (ref 0.0–0.1)
Basophils Relative: 0 %
Eosinophils Absolute: 0.2 10*3/uL (ref 0.0–0.5)
Eosinophils Relative: 1 %
HCT: 27.8 % — ABNORMAL LOW (ref 39.0–52.0)
Hemoglobin: 8.9 g/dL — ABNORMAL LOW (ref 13.0–17.0)
Immature Granulocytes: 1 %
Lymphocytes Relative: 12 %
Lymphs Abs: 2 10*3/uL (ref 0.7–4.0)
MCH: 28.5 pg (ref 26.0–34.0)
MCHC: 32 g/dL (ref 30.0–36.0)
MCV: 89.1 fL (ref 80.0–100.0)
Monocytes Absolute: 1.4 10*3/uL — ABNORMAL HIGH (ref 0.1–1.0)
Monocytes Relative: 9 %
Neutro Abs: 12.3 10*3/uL — ABNORMAL HIGH (ref 1.7–7.7)
Neutrophils Relative %: 77 %
Platelets: 267 10*3/uL (ref 150–400)
RBC: 3.12 MIL/uL — ABNORMAL LOW (ref 4.22–5.81)
RDW: 15.3 % (ref 11.5–15.5)
WBC: 16.1 10*3/uL — ABNORMAL HIGH (ref 4.0–10.5)
nRBC: 0 % (ref 0.0–0.2)

## 2023-07-14 LAB — RENAL FUNCTION PANEL
Albumin: 1.8 g/dL — ABNORMAL LOW (ref 3.5–5.0)
Anion gap: 11 (ref 5–15)
BUN: 50 mg/dL — ABNORMAL HIGH (ref 6–20)
CO2: 23 mmol/L (ref 22–32)
Calcium: 8.4 mg/dL — ABNORMAL LOW (ref 8.9–10.3)
Chloride: 94 mmol/L — ABNORMAL LOW (ref 98–111)
Creatinine, Ser: 3.37 mg/dL — ABNORMAL HIGH (ref 0.61–1.24)
GFR, Estimated: 21 mL/min — ABNORMAL LOW (ref >60)
Glucose, Bld: 144 mg/dL — ABNORMAL HIGH (ref 70–99)
Phosphorus: 4.3 mg/dL (ref 2.5–4.6)
Potassium: 5.1 mmol/L (ref 3.5–5.1)
Sodium: 128 mmol/L — ABNORMAL LOW (ref 135–145)

## 2023-07-15 ENCOUNTER — Other Ambulatory Visit (HOSPITAL_COMMUNITY): Payer: BC Managed Care – PPO

## 2023-07-15 DIAGNOSIS — N186 End stage renal disease: Secondary | ICD-10-CM | POA: Diagnosis not present

## 2023-07-15 DIAGNOSIS — J9621 Acute and chronic respiratory failure with hypoxia: Secondary | ICD-10-CM | POA: Diagnosis not present

## 2023-07-15 DIAGNOSIS — I629 Nontraumatic intracranial hemorrhage, unspecified: Secondary | ICD-10-CM | POA: Diagnosis not present

## 2023-07-15 DIAGNOSIS — Z930 Tracheostomy status: Secondary | ICD-10-CM | POA: Diagnosis not present

## 2023-07-16 DIAGNOSIS — I629 Nontraumatic intracranial hemorrhage, unspecified: Secondary | ICD-10-CM | POA: Diagnosis not present

## 2023-07-16 DIAGNOSIS — N186 End stage renal disease: Secondary | ICD-10-CM | POA: Diagnosis not present

## 2023-07-16 DIAGNOSIS — Z930 Tracheostomy status: Secondary | ICD-10-CM | POA: Diagnosis not present

## 2023-07-16 DIAGNOSIS — J9621 Acute and chronic respiratory failure with hypoxia: Secondary | ICD-10-CM | POA: Diagnosis not present

## 2023-07-16 LAB — CBC WITH DIFFERENTIAL/PLATELET
Abs Immature Granulocytes: 0.24 10*3/uL — ABNORMAL HIGH (ref 0.00–0.07)
Basophils Absolute: 0.1 10*3/uL (ref 0.0–0.1)
Basophils Relative: 0 %
Eosinophils Absolute: 0.3 10*3/uL (ref 0.0–0.5)
Eosinophils Relative: 2 %
HCT: 25.5 % — ABNORMAL LOW (ref 39.0–52.0)
Hemoglobin: 8.4 g/dL — ABNORMAL LOW (ref 13.0–17.0)
Immature Granulocytes: 2 %
Lymphocytes Relative: 13 %
Lymphs Abs: 2 10*3/uL (ref 0.7–4.0)
MCH: 29.7 pg (ref 26.0–34.0)
MCHC: 32.9 g/dL (ref 30.0–36.0)
MCV: 90.1 fL (ref 80.0–100.0)
Monocytes Absolute: 1.3 10*3/uL — ABNORMAL HIGH (ref 0.1–1.0)
Monocytes Relative: 8 %
Neutro Abs: 11.4 10*3/uL — ABNORMAL HIGH (ref 1.7–7.7)
Neutrophils Relative %: 75 %
Platelets: 253 10*3/uL (ref 150–400)
RBC: 2.83 MIL/uL — ABNORMAL LOW (ref 4.22–5.81)
RDW: 15.1 % (ref 11.5–15.5)
WBC: 15.3 10*3/uL — ABNORMAL HIGH (ref 4.0–10.5)
nRBC: 0 % (ref 0.0–0.2)

## 2023-07-16 LAB — RENAL FUNCTION PANEL
Albumin: 1.7 g/dL — ABNORMAL LOW (ref 3.5–5.0)
Anion gap: 9 (ref 5–15)
BUN: 54 mg/dL — ABNORMAL HIGH (ref 6–20)
CO2: 27 mmol/L (ref 22–32)
Calcium: 8.7 mg/dL — ABNORMAL LOW (ref 8.9–10.3)
Chloride: 92 mmol/L — ABNORMAL LOW (ref 98–111)
Creatinine, Ser: 3.06 mg/dL — ABNORMAL HIGH (ref 0.61–1.24)
GFR, Estimated: 24 mL/min — ABNORMAL LOW (ref >60)
Glucose, Bld: 179 mg/dL — ABNORMAL HIGH (ref 70–99)
Phosphorus: 3.3 mg/dL (ref 2.5–4.6)
Potassium: 4.9 mmol/L (ref 3.5–5.1)
Sodium: 128 mmol/L — ABNORMAL LOW (ref 135–145)

## 2023-07-17 DIAGNOSIS — I629 Nontraumatic intracranial hemorrhage, unspecified: Secondary | ICD-10-CM | POA: Diagnosis not present

## 2023-07-17 DIAGNOSIS — N186 End stage renal disease: Secondary | ICD-10-CM | POA: Diagnosis not present

## 2023-07-17 DIAGNOSIS — Z930 Tracheostomy status: Secondary | ICD-10-CM | POA: Diagnosis not present

## 2023-07-17 DIAGNOSIS — J9621 Acute and chronic respiratory failure with hypoxia: Secondary | ICD-10-CM | POA: Diagnosis not present

## 2023-07-18 DIAGNOSIS — Z930 Tracheostomy status: Secondary | ICD-10-CM | POA: Diagnosis not present

## 2023-07-18 DIAGNOSIS — N186 End stage renal disease: Secondary | ICD-10-CM | POA: Diagnosis not present

## 2023-07-18 DIAGNOSIS — J9621 Acute and chronic respiratory failure with hypoxia: Secondary | ICD-10-CM | POA: Diagnosis not present

## 2023-07-18 DIAGNOSIS — I629 Nontraumatic intracranial hemorrhage, unspecified: Secondary | ICD-10-CM | POA: Diagnosis not present

## 2023-07-19 ENCOUNTER — Other Ambulatory Visit (HOSPITAL_COMMUNITY): Payer: BC Managed Care – PPO

## 2023-07-19 DIAGNOSIS — N186 End stage renal disease: Secondary | ICD-10-CM | POA: Diagnosis not present

## 2023-07-19 DIAGNOSIS — Z930 Tracheostomy status: Secondary | ICD-10-CM | POA: Diagnosis not present

## 2023-07-19 DIAGNOSIS — J9621 Acute and chronic respiratory failure with hypoxia: Secondary | ICD-10-CM

## 2023-07-19 DIAGNOSIS — I629 Nontraumatic intracranial hemorrhage, unspecified: Secondary | ICD-10-CM | POA: Diagnosis not present

## 2023-07-19 LAB — URINALYSIS, ROUTINE W REFLEX MICROSCOPIC
Bacteria, UA: NONE SEEN
Bilirubin Urine: NEGATIVE
Glucose, UA: NEGATIVE mg/dL
Hgb urine dipstick: NEGATIVE
Ketones, ur: NEGATIVE mg/dL
Nitrite: NEGATIVE
Protein, ur: 100 mg/dL — AB
Specific Gravity, Urine: 1.01 (ref 1.005–1.030)
WBC, UA: >50 WBC/hpf (ref 0–5)
pH: 7 (ref 5.0–8.0)

## 2023-07-19 LAB — CBC WITH DIFFERENTIAL/PLATELET
Abs Immature Granulocytes: 0.29 10*3/uL — ABNORMAL HIGH (ref 0.00–0.07)
Basophils Absolute: 0.1 10*3/uL (ref 0.0–0.1)
Basophils Relative: 0 %
Eosinophils Absolute: 0.3 10*3/uL (ref 0.0–0.5)
Eosinophils Relative: 2 %
HCT: 23.4 % — ABNORMAL LOW (ref 39.0–52.0)
Hemoglobin: 7.7 g/dL — ABNORMAL LOW (ref 13.0–17.0)
Immature Granulocytes: 2 %
Lymphocytes Relative: 11 %
Lymphs Abs: 1.9 10*3/uL (ref 0.7–4.0)
MCH: 29.3 pg (ref 26.0–34.0)
MCHC: 32.9 g/dL (ref 30.0–36.0)
MCV: 89 fL (ref 80.0–100.0)
Monocytes Absolute: 1.6 10*3/uL — ABNORMAL HIGH (ref 0.1–1.0)
Monocytes Relative: 9 %
Neutro Abs: 12.5 10*3/uL — ABNORMAL HIGH (ref 1.7–7.7)
Neutrophils Relative %: 76 %
Platelets: 234 10*3/uL (ref 150–400)
RBC: 2.63 MIL/uL — ABNORMAL LOW (ref 4.22–5.81)
RDW: 14.9 % (ref 11.5–15.5)
WBC: 16.6 10*3/uL — ABNORMAL HIGH (ref 4.0–10.5)
nRBC: 0 % (ref 0.0–0.2)

## 2023-07-19 LAB — RENAL FUNCTION PANEL
Albumin: 1.6 g/dL — ABNORMAL LOW (ref 3.5–5.0)
Anion gap: 16 — ABNORMAL HIGH (ref 5–15)
BUN: 77 mg/dL — ABNORMAL HIGH (ref 6–20)
CO2: 20 mmol/L — ABNORMAL LOW (ref 22–32)
Calcium: 8.2 mg/dL — ABNORMAL LOW (ref 8.9–10.3)
Chloride: 91 mmol/L — ABNORMAL LOW (ref 98–111)
Creatinine, Ser: 3.39 mg/dL — ABNORMAL HIGH (ref 0.61–1.24)
GFR, Estimated: 21 mL/min — ABNORMAL LOW (ref >60)
Glucose, Bld: 106 mg/dL — ABNORMAL HIGH (ref 70–99)
Phosphorus: 5.2 mg/dL — ABNORMAL HIGH (ref 2.5–4.6)
Potassium: 5.2 mmol/L — ABNORMAL HIGH (ref 3.5–5.1)
Sodium: 127 mmol/L — ABNORMAL LOW (ref 135–145)

## 2023-07-20 DIAGNOSIS — Z930 Tracheostomy status: Secondary | ICD-10-CM | POA: Diagnosis not present

## 2023-07-20 DIAGNOSIS — N186 End stage renal disease: Secondary | ICD-10-CM

## 2023-07-20 DIAGNOSIS — J9621 Acute and chronic respiratory failure with hypoxia: Secondary | ICD-10-CM

## 2023-07-20 DIAGNOSIS — I629 Nontraumatic intracranial hemorrhage, unspecified: Secondary | ICD-10-CM

## 2023-07-20 LAB — URINE CULTURE: Culture: NO GROWTH

## 2023-07-21 DIAGNOSIS — I629 Nontraumatic intracranial hemorrhage, unspecified: Secondary | ICD-10-CM | POA: Diagnosis not present

## 2023-07-21 DIAGNOSIS — N186 End stage renal disease: Secondary | ICD-10-CM

## 2023-07-21 DIAGNOSIS — Z930 Tracheostomy status: Secondary | ICD-10-CM | POA: Diagnosis not present

## 2023-07-21 DIAGNOSIS — J9621 Acute and chronic respiratory failure with hypoxia: Secondary | ICD-10-CM

## 2023-07-21 LAB — CBC WITH DIFFERENTIAL/PLATELET
Abs Immature Granulocytes: 0.16 10*3/uL — ABNORMAL HIGH (ref 0.00–0.07)
Basophils Absolute: 0 10*3/uL (ref 0.0–0.1)
Basophils Relative: 0 %
Eosinophils Absolute: 0.3 10*3/uL (ref 0.0–0.5)
Eosinophils Relative: 2 %
HCT: 23.2 % — ABNORMAL LOW (ref 39.0–52.0)
Hemoglobin: 7.6 g/dL — ABNORMAL LOW (ref 13.0–17.0)
Immature Granulocytes: 1 %
Lymphocytes Relative: 11 %
Lymphs Abs: 1.7 10*3/uL (ref 0.7–4.0)
MCH: 28.7 pg (ref 26.0–34.0)
MCHC: 32.8 g/dL (ref 30.0–36.0)
MCV: 87.5 fL (ref 80.0–100.0)
Monocytes Absolute: 1.4 10*3/uL — ABNORMAL HIGH (ref 0.1–1.0)
Monocytes Relative: 9 %
Neutro Abs: 12.3 10*3/uL — ABNORMAL HIGH (ref 1.7–7.7)
Neutrophils Relative %: 77 %
Platelets: 227 10*3/uL (ref 150–400)
RBC: 2.65 MIL/uL — ABNORMAL LOW (ref 4.22–5.81)
RDW: 15 % (ref 11.5–15.5)
WBC: 15.9 10*3/uL — ABNORMAL HIGH (ref 4.0–10.5)
nRBC: 0 % (ref 0.0–0.2)

## 2023-07-21 LAB — RENAL FUNCTION PANEL
Albumin: 1.6 g/dL — ABNORMAL LOW (ref 3.5–5.0)
Anion gap: 14 (ref 5–15)
BUN: 47 mg/dL — ABNORMAL HIGH (ref 6–20)
CO2: 23 mmol/L (ref 22–32)
Calcium: 8.4 mg/dL — ABNORMAL LOW (ref 8.9–10.3)
Chloride: 91 mmol/L — ABNORMAL LOW (ref 98–111)
Creatinine, Ser: 2.86 mg/dL — ABNORMAL HIGH (ref 0.61–1.24)
GFR, Estimated: 26 mL/min — ABNORMAL LOW (ref >60)
Glucose, Bld: 94 mg/dL (ref 70–99)
Phosphorus: 3.5 mg/dL (ref 2.5–4.6)
Potassium: 4.6 mmol/L (ref 3.5–5.1)
Sodium: 128 mmol/L — ABNORMAL LOW (ref 135–145)

## 2023-07-22 DIAGNOSIS — N186 End stage renal disease: Secondary | ICD-10-CM | POA: Diagnosis not present

## 2023-07-22 DIAGNOSIS — J9621 Acute and chronic respiratory failure with hypoxia: Secondary | ICD-10-CM | POA: Diagnosis not present

## 2023-07-22 DIAGNOSIS — Z930 Tracheostomy status: Secondary | ICD-10-CM | POA: Diagnosis not present

## 2023-07-22 DIAGNOSIS — I629 Nontraumatic intracranial hemorrhage, unspecified: Secondary | ICD-10-CM | POA: Diagnosis not present

## 2023-07-23 ENCOUNTER — Encounter (HOSPITAL_COMMUNITY): Payer: Self-pay

## 2023-07-23 DIAGNOSIS — J9621 Acute and chronic respiratory failure with hypoxia: Secondary | ICD-10-CM | POA: Diagnosis not present

## 2023-07-23 DIAGNOSIS — N186 End stage renal disease: Secondary | ICD-10-CM | POA: Diagnosis not present

## 2023-07-23 DIAGNOSIS — Z930 Tracheostomy status: Secondary | ICD-10-CM | POA: Diagnosis not present

## 2023-07-23 DIAGNOSIS — I629 Nontraumatic intracranial hemorrhage, unspecified: Secondary | ICD-10-CM | POA: Diagnosis not present

## 2023-07-23 LAB — CBC WITH DIFFERENTIAL/PLATELET
Abs Immature Granulocytes: 0.16 10*3/uL — ABNORMAL HIGH (ref 0.00–0.07)
Basophils Absolute: 0 10*3/uL (ref 0.0–0.1)
Basophils Relative: 0 %
Eosinophils Absolute: 0.2 10*3/uL (ref 0.0–0.5)
Eosinophils Relative: 2 %
HCT: 21.3 % — ABNORMAL LOW (ref 39.0–52.0)
Hemoglobin: 7 g/dL — ABNORMAL LOW (ref 13.0–17.0)
Immature Granulocytes: 1 %
Lymphocytes Relative: 10 %
Lymphs Abs: 1.5 10*3/uL (ref 0.7–4.0)
MCH: 29 pg (ref 26.0–34.0)
MCHC: 32.9 g/dL (ref 30.0–36.0)
MCV: 88.4 fL (ref 80.0–100.0)
Monocytes Absolute: 1.1 10*3/uL — ABNORMAL HIGH (ref 0.1–1.0)
Monocytes Relative: 7 %
Neutro Abs: 11.8 10*3/uL — ABNORMAL HIGH (ref 1.7–7.7)
Neutrophils Relative %: 80 %
Platelets: 208 10*3/uL (ref 150–400)
RBC: 2.41 MIL/uL — ABNORMAL LOW (ref 4.22–5.81)
RDW: 14.8 % (ref 11.5–15.5)
WBC: 14.9 10*3/uL — ABNORMAL HIGH (ref 4.0–10.5)
nRBC: 0 % (ref 0.0–0.2)

## 2023-07-23 LAB — RENAL FUNCTION PANEL
Albumin: 1.5 g/dL — ABNORMAL LOW (ref 3.5–5.0)
Anion gap: 12 (ref 5–15)
BUN: 38 mg/dL — ABNORMAL HIGH (ref 6–20)
CO2: 23 mmol/L (ref 22–32)
Calcium: 8.1 mg/dL — ABNORMAL LOW (ref 8.9–10.3)
Chloride: 92 mmol/L — ABNORMAL LOW (ref 98–111)
Creatinine, Ser: 2.74 mg/dL — ABNORMAL HIGH (ref 0.61–1.24)
GFR, Estimated: 27 mL/min — ABNORMAL LOW (ref >60)
Glucose, Bld: 109 mg/dL — ABNORMAL HIGH (ref 70–99)
Phosphorus: 3.5 mg/dL (ref 2.5–4.6)
Potassium: 4.2 mmol/L (ref 3.5–5.1)
Sodium: 127 mmol/L — ABNORMAL LOW (ref 135–145)

## 2023-07-25 LAB — CBC WITH DIFFERENTIAL/PLATELET
Abs Immature Granulocytes: 0.21 10*3/uL — ABNORMAL HIGH (ref 0.00–0.07)
Basophils Absolute: 0 10*3/uL (ref 0.0–0.1)
Basophils Relative: 0 %
Eosinophils Absolute: 0.4 10*3/uL (ref 0.0–0.5)
Eosinophils Relative: 2 %
HCT: 22.8 % — ABNORMAL LOW (ref 39.0–52.0)
Hemoglobin: 7.5 g/dL — ABNORMAL LOW (ref 13.0–17.0)
Immature Granulocytes: 1 %
Lymphocytes Relative: 11 %
Lymphs Abs: 1.8 10*3/uL (ref 0.7–4.0)
MCH: 29.5 pg (ref 26.0–34.0)
MCHC: 32.9 g/dL (ref 30.0–36.0)
MCV: 89.8 fL (ref 80.0–100.0)
Monocytes Absolute: 1.1 10*3/uL — ABNORMAL HIGH (ref 0.1–1.0)
Monocytes Relative: 7 %
Neutro Abs: 12.6 10*3/uL — ABNORMAL HIGH (ref 1.7–7.7)
Neutrophils Relative %: 79 %
Platelets: 217 10*3/uL (ref 150–400)
RBC: 2.54 MIL/uL — ABNORMAL LOW (ref 4.22–5.81)
RDW: 14.8 % (ref 11.5–15.5)
WBC: 16 10*3/uL — ABNORMAL HIGH (ref 4.0–10.5)
nRBC: 0 % (ref 0.0–0.2)

## 2023-07-25 LAB — COMPREHENSIVE METABOLIC PANEL
ALT: 184 U/L — ABNORMAL HIGH (ref 0–44)
AST: 75 U/L — ABNORMAL HIGH (ref 15–41)
Albumin: 1.5 g/dL — ABNORMAL LOW (ref 3.5–5.0)
Alkaline Phosphatase: 149 U/L — ABNORMAL HIGH (ref 38–126)
Anion gap: 11 (ref 5–15)
BUN: 37 mg/dL — ABNORMAL HIGH (ref 6–20)
CO2: 24 mmol/L (ref 22–32)
Calcium: 8.1 mg/dL — ABNORMAL LOW (ref 8.9–10.3)
Chloride: 93 mmol/L — ABNORMAL LOW (ref 98–111)
Creatinine, Ser: 2.6 mg/dL — ABNORMAL HIGH (ref 0.61–1.24)
GFR, Estimated: 29 mL/min — ABNORMAL LOW (ref >60)
Glucose, Bld: 142 mg/dL — ABNORMAL HIGH (ref 70–99)
Potassium: 4.3 mmol/L (ref 3.5–5.1)
Sodium: 128 mmol/L — ABNORMAL LOW (ref 135–145)
Total Bilirubin: 0.5 mg/dL (ref 0.3–1.2)
Total Protein: 5.9 g/dL — ABNORMAL LOW (ref 6.5–8.1)

## 2023-07-26 DIAGNOSIS — I629 Nontraumatic intracranial hemorrhage, unspecified: Secondary | ICD-10-CM | POA: Diagnosis not present

## 2023-07-26 DIAGNOSIS — N186 End stage renal disease: Secondary | ICD-10-CM | POA: Diagnosis not present

## 2023-07-26 DIAGNOSIS — Z930 Tracheostomy status: Secondary | ICD-10-CM | POA: Diagnosis not present

## 2023-07-26 DIAGNOSIS — J9621 Acute and chronic respiratory failure with hypoxia: Secondary | ICD-10-CM | POA: Diagnosis not present

## 2023-07-26 LAB — OCCULT BLOOD X 1 CARD TO LAB, STOOL: Fecal Occult Bld: NEGATIVE

## 2023-07-26 LAB — CBC WITH DIFFERENTIAL/PLATELET
Abs Immature Granulocytes: 0.22 K/uL — ABNORMAL HIGH (ref 0.00–0.07)
Basophils Absolute: 0 K/uL (ref 0.0–0.1)
Basophils Relative: 0 %
Eosinophils Absolute: 0.4 K/uL (ref 0.0–0.5)
Eosinophils Relative: 2 %
HCT: 20.7 % — ABNORMAL LOW (ref 39.0–52.0)
Hemoglobin: 7 g/dL — ABNORMAL LOW (ref 13.0–17.0)
Immature Granulocytes: 1 %
Lymphocytes Relative: 11 %
Lymphs Abs: 1.7 K/uL (ref 0.7–4.0)
MCH: 30 pg (ref 26.0–34.0)
MCHC: 33.8 g/dL (ref 30.0–36.0)
MCV: 88.8 fL (ref 80.0–100.0)
Monocytes Absolute: 1.1 K/uL — ABNORMAL HIGH (ref 0.1–1.0)
Monocytes Relative: 7 %
Neutro Abs: 12 K/uL — ABNORMAL HIGH (ref 1.7–7.7)
Neutrophils Relative %: 79 %
Platelets: 217 K/uL (ref 150–400)
RBC: 2.33 MIL/uL — ABNORMAL LOW (ref 4.22–5.81)
RDW: 14.9 % (ref 11.5–15.5)
WBC: 15.4 K/uL — ABNORMAL HIGH (ref 4.0–10.5)
nRBC: 0 % (ref 0.0–0.2)

## 2023-07-26 LAB — RENAL FUNCTION PANEL
Albumin: <1.5 g/dL — ABNORMAL LOW (ref 3.5–5.0)
Anion gap: 10 (ref 5–15)
BUN: 48 mg/dL — ABNORMAL HIGH (ref 6–20)
CO2: 23 mmol/L (ref 22–32)
Calcium: 8 mg/dL — ABNORMAL LOW (ref 8.9–10.3)
Chloride: 93 mmol/L — ABNORMAL LOW (ref 98–111)
Creatinine, Ser: 3 mg/dL — ABNORMAL HIGH (ref 0.61–1.24)
GFR, Estimated: 24 mL/min — ABNORMAL LOW (ref >60)
Glucose, Bld: 104 mg/dL — ABNORMAL HIGH (ref 70–99)
Phosphorus: 3.4 mg/dL (ref 2.5–4.6)
Potassium: 4.5 mmol/L (ref 3.5–5.1)
Sodium: 126 mmol/L — ABNORMAL LOW (ref 135–145)

## 2023-07-26 LAB — PREPARE RBC (CROSSMATCH)

## 2023-07-27 LAB — BPAM RBC
Blood Product Expiration Date: 202409212359
ISSUE DATE / TIME: 202409161657
Unit Type and Rh: 1700

## 2023-07-27 LAB — TYPE AND SCREEN
ABO/RH(D): B POS
Antibody Screen: NEGATIVE
Unit division: 0

## 2023-07-27 LAB — CBC WITH DIFFERENTIAL/PLATELET
Abs Immature Granulocytes: 0.18 10*3/uL — ABNORMAL HIGH (ref 0.00–0.07)
Basophils Absolute: 0.1 10*3/uL (ref 0.0–0.1)
Basophils Relative: 0 %
Eosinophils Absolute: 0.2 10*3/uL (ref 0.0–0.5)
Eosinophils Relative: 1 %
HCT: 24.4 % — ABNORMAL LOW (ref 39.0–52.0)
Hemoglobin: 8 g/dL — ABNORMAL LOW (ref 13.0–17.0)
Immature Granulocytes: 1 %
Lymphocytes Relative: 9 %
Lymphs Abs: 1.4 10*3/uL (ref 0.7–4.0)
MCH: 29.6 pg (ref 26.0–34.0)
MCHC: 32.8 g/dL (ref 30.0–36.0)
MCV: 90.4 fL (ref 80.0–100.0)
Monocytes Absolute: 1.4 10*3/uL — ABNORMAL HIGH (ref 0.1–1.0)
Monocytes Relative: 9 %
Neutro Abs: 12.8 10*3/uL — ABNORMAL HIGH (ref 1.7–7.7)
Neutrophils Relative %: 80 %
Platelets: 228 10*3/uL (ref 150–400)
RBC: 2.7 MIL/uL — ABNORMAL LOW (ref 4.22–5.81)
RDW: 15.5 % (ref 11.5–15.5)
WBC: 16.1 10*3/uL — ABNORMAL HIGH (ref 4.0–10.5)
nRBC: 0 % (ref 0.0–0.2)

## 2023-07-28 ENCOUNTER — Other Ambulatory Visit (HOSPITAL_COMMUNITY): Payer: BC Managed Care – PPO

## 2023-07-30 LAB — RENAL FUNCTION PANEL
Albumin: <1.5 g/dL — ABNORMAL LOW (ref 3.5–5.0)
Anion gap: 11 (ref 5–15)
BUN: 10 mg/dL (ref 6–20)
CO2: 26 mmol/L (ref 22–32)
Calcium: 7.9 mg/dL — ABNORMAL LOW (ref 8.9–10.3)
Chloride: 94 mmol/L — ABNORMAL LOW (ref 98–111)
Creatinine, Ser: 1.41 mg/dL — ABNORMAL HIGH (ref 0.61–1.24)
GFR, Estimated: >60 mL/min (ref >60)
Glucose, Bld: 145 mg/dL — ABNORMAL HIGH (ref 70–99)
Phosphorus: 1.6 mg/dL — ABNORMAL LOW (ref 2.5–4.6)
Potassium: 3.3 mmol/L — ABNORMAL LOW (ref 3.5–5.1)
Sodium: 131 mmol/L — ABNORMAL LOW (ref 135–145)

## 2023-07-30 LAB — CBC WITH DIFFERENTIAL/PLATELET
Abs Immature Granulocytes: 0.17 10*3/uL — ABNORMAL HIGH (ref 0.00–0.07)
Basophils Absolute: 0 10*3/uL (ref 0.0–0.1)
Basophils Relative: 0 %
Eosinophils Absolute: 0.2 10*3/uL (ref 0.0–0.5)
Eosinophils Relative: 2 %
HCT: 23.1 % — ABNORMAL LOW (ref 39.0–52.0)
Hemoglobin: 7.5 g/dL — ABNORMAL LOW (ref 13.0–17.0)
Immature Granulocytes: 1 %
Lymphocytes Relative: 9 %
Lymphs Abs: 1.3 10*3/uL (ref 0.7–4.0)
MCH: 28.8 pg (ref 26.0–34.0)
MCHC: 32.5 g/dL (ref 30.0–36.0)
MCV: 88.8 fL (ref 80.0–100.0)
Monocytes Absolute: 1.1 10*3/uL — ABNORMAL HIGH (ref 0.1–1.0)
Monocytes Relative: 8 %
Neutro Abs: 11.5 10*3/uL — ABNORMAL HIGH (ref 1.7–7.7)
Neutrophils Relative %: 80 %
Platelets: 269 10*3/uL (ref 150–400)
RBC: 2.6 MIL/uL — ABNORMAL LOW (ref 4.22–5.81)
RDW: 15.1 % (ref 11.5–15.5)
WBC: 14.2 10*3/uL — ABNORMAL HIGH (ref 4.0–10.5)
nRBC: 0 % (ref 0.0–0.2)

## 2023-07-31 LAB — RENAL FUNCTION PANEL
Albumin: <1.5 g/dL — ABNORMAL LOW (ref 3.5–5.0)
Anion gap: 9 (ref 5–15)
BUN: 11 mg/dL (ref 6–20)
CO2: 24 mmol/L (ref 22–32)
Calcium: 7.6 mg/dL — ABNORMAL LOW (ref 8.9–10.3)
Chloride: 95 mmol/L — ABNORMAL LOW (ref 98–111)
Creatinine, Ser: 1.86 mg/dL — ABNORMAL HIGH (ref 0.61–1.24)
GFR, Estimated: 43 mL/min — ABNORMAL LOW (ref >60)
Glucose, Bld: 155 mg/dL — ABNORMAL HIGH (ref 70–99)
Phosphorus: 3.5 mg/dL (ref 2.5–4.6)
Potassium: 3.6 mmol/L (ref 3.5–5.1)
Sodium: 128 mmol/L — ABNORMAL LOW (ref 135–145)

## 2023-08-01 LAB — HEPATITIS B SURFACE ANTIGEN: Hepatitis B Surface Ag: NONREACTIVE

## 2023-08-02 ENCOUNTER — Other Ambulatory Visit (HOSPITAL_COMMUNITY): Payer: BC Managed Care – PPO

## 2023-08-02 LAB — CBC WITH DIFFERENTIAL/PLATELET
Abs Immature Granulocytes: 0.15 10*3/uL — ABNORMAL HIGH (ref 0.00–0.07)
Basophils Absolute: 0 10*3/uL (ref 0.0–0.1)
Basophils Relative: 0 %
Eosinophils Absolute: 0.1 10*3/uL (ref 0.0–0.5)
Eosinophils Relative: 1 %
HCT: 22.7 % — ABNORMAL LOW (ref 39.0–52.0)
Hemoglobin: 7.3 g/dL — ABNORMAL LOW (ref 13.0–17.0)
Immature Granulocytes: 1 %
Lymphocytes Relative: 9 %
Lymphs Abs: 1.5 10*3/uL (ref 0.7–4.0)
MCH: 29.2 pg (ref 26.0–34.0)
MCHC: 32.2 g/dL (ref 30.0–36.0)
MCV: 90.8 fL (ref 80.0–100.0)
Monocytes Absolute: 1.4 10*3/uL — ABNORMAL HIGH (ref 0.1–1.0)
Monocytes Relative: 8 %
Neutro Abs: 14.1 10*3/uL — ABNORMAL HIGH (ref 1.7–7.7)
Neutrophils Relative %: 81 %
Platelets: 310 10*3/uL (ref 150–400)
RBC: 2.5 MIL/uL — ABNORMAL LOW (ref 4.22–5.81)
RDW: 15.5 % (ref 11.5–15.5)
WBC: 17.3 10*3/uL — ABNORMAL HIGH (ref 4.0–10.5)
nRBC: 0 % (ref 0.0–0.2)

## 2023-08-02 LAB — RENAL FUNCTION PANEL
Albumin: <1.5 g/dL — ABNORMAL LOW (ref 3.5–5.0)
Anion gap: 10 (ref 5–15)
BUN: 26 mg/dL — ABNORMAL HIGH (ref 6–20)
CO2: 25 mmol/L (ref 22–32)
Calcium: 7.9 mg/dL — ABNORMAL LOW (ref 8.9–10.3)
Chloride: 92 mmol/L — ABNORMAL LOW (ref 98–111)
Creatinine, Ser: 2.99 mg/dL — ABNORMAL HIGH (ref 0.61–1.24)
GFR, Estimated: 24 mL/min — ABNORMAL LOW (ref >60)
Glucose, Bld: 109 mg/dL — ABNORMAL HIGH (ref 70–99)
Phosphorus: 3.6 mg/dL (ref 2.5–4.6)
Potassium: 4.8 mmol/L (ref 3.5–5.1)
Sodium: 127 mmol/L — ABNORMAL LOW (ref 135–145)

## 2023-08-03 LAB — HEPATITIS B CORE ANTIBODY, TOTAL: Hep B Core Total Ab: NONREACTIVE

## 2023-08-04 ENCOUNTER — Ambulatory Visit (HOSPITAL_COMMUNITY): Payer: BC Managed Care – PPO | Attending: Internal Medicine

## 2023-08-04 DIAGNOSIS — N189 Chronic kidney disease, unspecified: Secondary | ICD-10-CM | POA: Insufficient documentation

## 2023-08-04 DIAGNOSIS — N281 Cyst of kidney, acquired: Secondary | ICD-10-CM | POA: Insufficient documentation

## 2023-08-04 LAB — CBC WITH DIFFERENTIAL/PLATELET
Abs Immature Granulocytes: 0.14 10*3/uL — ABNORMAL HIGH (ref 0.00–0.07)
Basophils Absolute: 0 10*3/uL (ref 0.0–0.1)
Basophils Relative: 0 %
Eosinophils Absolute: 0.2 10*3/uL (ref 0.0–0.5)
Eosinophils Relative: 1 %
HCT: 22.9 % — ABNORMAL LOW (ref 39.0–52.0)
Hemoglobin: 7.5 g/dL — ABNORMAL LOW (ref 13.0–17.0)
Immature Granulocytes: 1 %
Lymphocytes Relative: 11 %
Lymphs Abs: 1.9 10*3/uL (ref 0.7–4.0)
MCH: 30.6 pg (ref 26.0–34.0)
MCHC: 32.8 g/dL (ref 30.0–36.0)
MCV: 93.5 fL (ref 80.0–100.0)
Monocytes Absolute: 1.7 10*3/uL — ABNORMAL HIGH (ref 0.1–1.0)
Monocytes Relative: 9 %
Neutro Abs: 13.8 10*3/uL — ABNORMAL HIGH (ref 1.7–7.7)
Neutrophils Relative %: 78 %
Platelets: 255 10*3/uL (ref 150–400)
RBC: 2.45 MIL/uL — ABNORMAL LOW (ref 4.22–5.81)
RDW: 15.6 % — ABNORMAL HIGH (ref 11.5–15.5)
WBC: 17.7 10*3/uL — ABNORMAL HIGH (ref 4.0–10.5)
nRBC: 0 % (ref 0.0–0.2)

## 2023-08-04 LAB — RENAL FUNCTION PANEL
Albumin: <1.5 g/dL — ABNORMAL LOW (ref 3.5–5.0)
Anion gap: 13 (ref 5–15)
BUN: 23 mg/dL — ABNORMAL HIGH (ref 6–20)
CO2: 24 mmol/L (ref 22–32)
Calcium: 8.1 mg/dL — ABNORMAL LOW (ref 8.9–10.3)
Chloride: 93 mmol/L — ABNORMAL LOW (ref 98–111)
Creatinine, Ser: 2.69 mg/dL — ABNORMAL HIGH (ref 0.61–1.24)
GFR, Estimated: 28 mL/min — ABNORMAL LOW (ref >60)
Glucose, Bld: 85 mg/dL (ref 70–99)
Phosphorus: 3.2 mg/dL (ref 2.5–4.6)
Potassium: 4.8 mmol/L (ref 3.5–5.1)
Sodium: 130 mmol/L — ABNORMAL LOW (ref 135–145)

## 2023-08-06 LAB — CBC WITH DIFFERENTIAL/PLATELET
Abs Immature Granulocytes: 0.22 10*3/uL — ABNORMAL HIGH (ref 0.00–0.07)
Basophils Absolute: 0 10*3/uL (ref 0.0–0.1)
Basophils Relative: 0 %
Eosinophils Absolute: 0.2 10*3/uL (ref 0.0–0.5)
Eosinophils Relative: 1 %
HCT: 22.5 % — ABNORMAL LOW (ref 39.0–52.0)
Hemoglobin: 7.2 g/dL — ABNORMAL LOW (ref 13.0–17.0)
Immature Granulocytes: 1 %
Lymphocytes Relative: 10 %
Lymphs Abs: 1.6 10*3/uL (ref 0.7–4.0)
MCH: 29.6 pg (ref 26.0–34.0)
MCHC: 32 g/dL (ref 30.0–36.0)
MCV: 92.6 fL (ref 80.0–100.0)
Monocytes Absolute: 1.3 10*3/uL — ABNORMAL HIGH (ref 0.1–1.0)
Monocytes Relative: 8 %
Neutro Abs: 12.4 10*3/uL — ABNORMAL HIGH (ref 1.7–7.7)
Neutrophils Relative %: 80 %
Platelets: 300 10*3/uL (ref 150–400)
RBC: 2.43 MIL/uL — ABNORMAL LOW (ref 4.22–5.81)
RDW: 15.4 % (ref 11.5–15.5)
WBC: 15.6 10*3/uL — ABNORMAL HIGH (ref 4.0–10.5)
nRBC: 0 % (ref 0.0–0.2)

## 2023-08-06 LAB — RENAL FUNCTION PANEL
Albumin: <1.5 g/dL — ABNORMAL LOW (ref 3.5–5.0)
Anion gap: 9 (ref 5–15)
BUN: 23 mg/dL — ABNORMAL HIGH (ref 6–20)
CO2: 26 mmol/L (ref 22–32)
Calcium: 8.1 mg/dL — ABNORMAL LOW (ref 8.9–10.3)
Chloride: 95 mmol/L — ABNORMAL LOW (ref 98–111)
Creatinine, Ser: 2.5 mg/dL — ABNORMAL HIGH (ref 0.61–1.24)
GFR, Estimated: 30 mL/min — ABNORMAL LOW (ref >60)
Glucose, Bld: 61 mg/dL — ABNORMAL LOW (ref 70–99)
Phosphorus: 2.8 mg/dL (ref 2.5–4.6)
Potassium: 4.3 mmol/L (ref 3.5–5.1)
Sodium: 130 mmol/L — ABNORMAL LOW (ref 135–145)

## 2023-08-09 LAB — RENAL FUNCTION PANEL
Albumin: 1.5 g/dL — ABNORMAL LOW (ref 3.5–5.0)
Anion gap: 10 (ref 5–15)
BUN: 28 mg/dL — ABNORMAL HIGH (ref 6–20)
CO2: 23 mmol/L (ref 22–32)
Calcium: 8.1 mg/dL — ABNORMAL LOW (ref 8.9–10.3)
Chloride: 95 mmol/L — ABNORMAL LOW (ref 98–111)
Creatinine, Ser: 2.97 mg/dL — ABNORMAL HIGH (ref 0.61–1.24)
GFR, Estimated: 25 mL/min — ABNORMAL LOW (ref >60)
Glucose, Bld: 87 mg/dL (ref 70–99)
Phosphorus: 2.9 mg/dL (ref 2.5–4.6)
Potassium: 5.1 mmol/L (ref 3.5–5.1)
Sodium: 128 mmol/L — ABNORMAL LOW (ref 135–145)

## 2023-08-09 LAB — CBC WITH DIFFERENTIAL/PLATELET
Abs Immature Granulocytes: 0.22 10*3/uL — ABNORMAL HIGH (ref 0.00–0.07)
Basophils Absolute: 0 10*3/uL (ref 0.0–0.1)
Basophils Relative: 0 %
Eosinophils Absolute: 0.2 10*3/uL (ref 0.0–0.5)
Eosinophils Relative: 1 %
HCT: 23.6 % — ABNORMAL LOW (ref 39.0–52.0)
Hemoglobin: 7.5 g/dL — ABNORMAL LOW (ref 13.0–17.0)
Immature Granulocytes: 1 %
Lymphocytes Relative: 9 %
Lymphs Abs: 1.6 10*3/uL (ref 0.7–4.0)
MCH: 29.8 pg (ref 26.0–34.0)
MCHC: 31.8 g/dL (ref 30.0–36.0)
MCV: 93.7 fL (ref 80.0–100.0)
Monocytes Absolute: 1.5 10*3/uL — ABNORMAL HIGH (ref 0.1–1.0)
Monocytes Relative: 8 %
Neutro Abs: 14.3 10*3/uL — ABNORMAL HIGH (ref 1.7–7.7)
Neutrophils Relative %: 81 %
Platelets: 285 10*3/uL (ref 150–400)
RBC: 2.52 MIL/uL — ABNORMAL LOW (ref 4.22–5.81)
RDW: 15.8 % — ABNORMAL HIGH (ref 11.5–15.5)
WBC: 17.8 10*3/uL — ABNORMAL HIGH (ref 4.0–10.5)
nRBC: 0 % (ref 0.0–0.2)

## 2023-08-25 DIAGNOSIS — Z992 Dependence on renal dialysis: Secondary | ICD-10-CM | POA: Insufficient documentation

## 2023-09-17 ENCOUNTER — Emergency Department (HOSPITAL_COMMUNITY): Payer: BC Managed Care – PPO

## 2023-09-17 ENCOUNTER — Encounter (HOSPITAL_COMMUNITY): Payer: Self-pay | Admitting: Emergency Medicine

## 2023-09-17 ENCOUNTER — Other Ambulatory Visit: Payer: Self-pay

## 2023-09-17 ENCOUNTER — Inpatient Hospital Stay (HOSPITAL_COMMUNITY)
Admission: EM | Admit: 2023-09-17 | Discharge: 2023-10-04 | DRG: 698 | Disposition: A | Discharge status: Home Health | Payer: BC Managed Care – PPO | Attending: Internal Medicine | Admitting: Internal Medicine

## 2023-09-17 DIAGNOSIS — N390 Urinary tract infection, site not specified: Secondary | ICD-10-CM | POA: Diagnosis present

## 2023-09-17 DIAGNOSIS — S88112D Complete traumatic amputation at level between knee and ankle, left lower leg, subsequent encounter: Secondary | ICD-10-CM

## 2023-09-17 DIAGNOSIS — D631 Anemia in chronic kidney disease: Secondary | ICD-10-CM | POA: Diagnosis present

## 2023-09-17 DIAGNOSIS — L89154 Pressure ulcer of sacral region, stage 4: Secondary | ICD-10-CM | POA: Diagnosis present

## 2023-09-17 DIAGNOSIS — I120 Hypertensive chronic kidney disease with stage 5 chronic kidney disease or end stage renal disease: Secondary | ICD-10-CM | POA: Diagnosis present

## 2023-09-17 DIAGNOSIS — Z6823 Body mass index (BMI) 23.0-23.9, adult: Secondary | ICD-10-CM

## 2023-09-17 DIAGNOSIS — R569 Unspecified convulsions: Secondary | ICD-10-CM | POA: Diagnosis present

## 2023-09-17 DIAGNOSIS — Z823 Family history of stroke: Secondary | ICD-10-CM

## 2023-09-17 DIAGNOSIS — K9423 Gastrostomy malfunction: Secondary | ICD-10-CM | POA: Diagnosis present

## 2023-09-17 DIAGNOSIS — I619 Nontraumatic intracerebral hemorrhage, unspecified: Secondary | ICD-10-CM | POA: Diagnosis present

## 2023-09-17 DIAGNOSIS — I69354 Hemiplegia and hemiparesis following cerebral infarction affecting left non-dominant side: Secondary | ICD-10-CM

## 2023-09-17 DIAGNOSIS — Z79899 Other long term (current) drug therapy: Secondary | ICD-10-CM | POA: Diagnosis not present

## 2023-09-17 DIAGNOSIS — N2581 Secondary hyperparathyroidism of renal origin: Secondary | ICD-10-CM | POA: Diagnosis present

## 2023-09-17 DIAGNOSIS — L89159 Pressure ulcer of sacral region, unspecified stage: Secondary | ICD-10-CM

## 2023-09-17 DIAGNOSIS — Z992 Dependence on renal dialysis: Secondary | ICD-10-CM | POA: Diagnosis not present

## 2023-09-17 DIAGNOSIS — E440 Moderate protein-calorie malnutrition: Secondary | ICD-10-CM | POA: Insufficient documentation

## 2023-09-17 DIAGNOSIS — R509 Fever, unspecified: Secondary | ICD-10-CM | POA: Diagnosis not present

## 2023-09-17 DIAGNOSIS — I480 Paroxysmal atrial fibrillation: Secondary | ICD-10-CM | POA: Diagnosis present

## 2023-09-17 DIAGNOSIS — A419 Sepsis, unspecified organism: Secondary | ICD-10-CM | POA: Diagnosis present

## 2023-09-17 DIAGNOSIS — T83518A Infection and inflammatory reaction due to other urinary catheter, initial encounter: Principal | ICD-10-CM | POA: Diagnosis present

## 2023-09-17 DIAGNOSIS — N3000 Acute cystitis without hematuria: Secondary | ICD-10-CM

## 2023-09-17 DIAGNOSIS — Z89512 Acquired absence of left leg below knee: Secondary | ICD-10-CM | POA: Diagnosis not present

## 2023-09-17 DIAGNOSIS — Z8249 Family history of ischemic heart disease and other diseases of the circulatory system: Secondary | ICD-10-CM

## 2023-09-17 DIAGNOSIS — N186 End stage renal disease: Secondary | ICD-10-CM | POA: Diagnosis present

## 2023-09-17 DIAGNOSIS — D72829 Elevated white blood cell count, unspecified: Secondary | ICD-10-CM | POA: Diagnosis not present

## 2023-09-17 DIAGNOSIS — N289 Disorder of kidney and ureter, unspecified: Secondary | ICD-10-CM | POA: Diagnosis not present

## 2023-09-17 DIAGNOSIS — Z7984 Long term (current) use of oral hypoglycemic drugs: Secondary | ICD-10-CM

## 2023-09-17 DIAGNOSIS — R4189 Other symptoms and signs involving cognitive functions and awareness: Secondary | ICD-10-CM | POA: Diagnosis present

## 2023-09-17 DIAGNOSIS — Y846 Urinary catheterization as the cause of abnormal reaction of the patient, or of later complication, without mention of misadventure at the time of the procedure: Secondary | ICD-10-CM | POA: Diagnosis present

## 2023-09-17 DIAGNOSIS — G910 Communicating hydrocephalus: Secondary | ICD-10-CM | POA: Diagnosis present

## 2023-09-17 DIAGNOSIS — E1159 Type 2 diabetes mellitus with other circulatory complications: Secondary | ICD-10-CM

## 2023-09-17 DIAGNOSIS — E43 Unspecified severe protein-calorie malnutrition: Secondary | ICD-10-CM | POA: Diagnosis present

## 2023-09-17 DIAGNOSIS — J9811 Atelectasis: Secondary | ICD-10-CM | POA: Diagnosis present

## 2023-09-17 DIAGNOSIS — E1122 Type 2 diabetes mellitus with diabetic chronic kidney disease: Secondary | ICD-10-CM | POA: Diagnosis present

## 2023-09-17 DIAGNOSIS — J90 Pleural effusion, not elsewhere classified: Secondary | ICD-10-CM | POA: Diagnosis present

## 2023-09-17 DIAGNOSIS — I10 Essential (primary) hypertension: Secondary | ICD-10-CM | POA: Diagnosis not present

## 2023-09-17 DIAGNOSIS — E876 Hypokalemia: Secondary | ICD-10-CM | POA: Diagnosis present

## 2023-09-17 DIAGNOSIS — R1110 Vomiting, unspecified: Secondary | ICD-10-CM | POA: Diagnosis present

## 2023-09-17 DIAGNOSIS — E559 Vitamin D deficiency, unspecified: Secondary | ICD-10-CM | POA: Diagnosis present

## 2023-09-17 DIAGNOSIS — E871 Hypo-osmolality and hyponatremia: Secondary | ICD-10-CM | POA: Diagnosis present

## 2023-09-17 DIAGNOSIS — B965 Pseudomonas (aeruginosa) (mallei) (pseudomallei) as the cause of diseases classified elsewhere: Secondary | ICD-10-CM | POA: Diagnosis present

## 2023-09-17 DIAGNOSIS — Z8661 Personal history of infections of the central nervous system: Secondary | ICD-10-CM

## 2023-09-17 DIAGNOSIS — R112 Nausea with vomiting, unspecified: Secondary | ICD-10-CM | POA: Diagnosis present

## 2023-09-17 DIAGNOSIS — K5900 Constipation, unspecified: Secondary | ICD-10-CM | POA: Diagnosis not present

## 2023-09-17 DIAGNOSIS — Z794 Long term (current) use of insulin: Secondary | ICD-10-CM

## 2023-09-17 HISTORY — DX: Nontraumatic intracerebral hemorrhage, unspecified: I61.9

## 2023-09-17 LAB — URINALYSIS, ROUTINE W REFLEX MICROSCOPIC
Bilirubin Urine: NEGATIVE
Glucose, UA: 50 mg/dL — AB
Ketones, ur: NEGATIVE mg/dL
Nitrite: NEGATIVE
Protein, ur: 100 mg/dL — AB
RBC / HPF: >50 RBC/hpf (ref 0–5)
Specific Gravity, Urine: 1.025 (ref 1.005–1.030)
WBC, UA: >50 WBC/hpf (ref 0–5)
pH: 8 (ref 5.0–8.0)

## 2023-09-17 LAB — COMPREHENSIVE METABOLIC PANEL
ALT: 30 U/L (ref 0–44)
AST: 27 U/L (ref 15–41)
Albumin: 1.6 g/dL — ABNORMAL LOW (ref 3.5–5.0)
Alkaline Phosphatase: 106 U/L (ref 38–126)
Anion gap: 9 (ref 5–15)
BUN: 9 mg/dL (ref 6–20)
CO2: 27 mmol/L (ref 22–32)
Calcium: 7.8 mg/dL — ABNORMAL LOW (ref 8.9–10.3)
Chloride: 96 mmol/L — ABNORMAL LOW (ref 98–111)
Creatinine, Ser: 1.69 mg/dL — ABNORMAL HIGH (ref 0.61–1.24)
GFR, Estimated: 49 mL/min — ABNORMAL LOW (ref >60)
Glucose, Bld: 142 mg/dL — ABNORMAL HIGH (ref 70–99)
Potassium: 3.1 mmol/L — ABNORMAL LOW (ref 3.5–5.1)
Sodium: 132 mmol/L — ABNORMAL LOW (ref 135–145)
Total Bilirubin: 0.8 mg/dL (ref <1.2)
Total Protein: 5.4 g/dL — ABNORMAL LOW (ref 6.5–8.1)

## 2023-09-17 LAB — CBC
HCT: 24.3 % — ABNORMAL LOW (ref 39.0–52.0)
Hemoglobin: 7.9 g/dL — ABNORMAL LOW (ref 13.0–17.0)
MCH: 30.7 pg (ref 26.0–34.0)
MCHC: 32.5 g/dL (ref 30.0–36.0)
MCV: 94.6 fL (ref 80.0–100.0)
Platelets: 276 10*3/uL (ref 150–400)
RBC: 2.57 MIL/uL — ABNORMAL LOW (ref 4.22–5.81)
RDW: 15.9 % — ABNORMAL HIGH (ref 11.5–15.5)
WBC: 20.4 10*3/uL — ABNORMAL HIGH (ref 4.0–10.5)
nRBC: 0 % (ref 0.0–0.2)

## 2023-09-17 LAB — LIPASE, BLOOD: Lipase: 43 U/L (ref 11–51)

## 2023-09-17 MED ORDER — IOHEXOL 350 MG/ML SOLN
75.0000 mL | Freq: Once | INTRAVENOUS | Status: AC | PRN
  Administered 2023-09-17: 75 mL via INTRAVENOUS

## 2023-09-17 MED ORDER — ONDANSETRON HCL 4 MG/2ML IJ SOLN
4.0000 mg | Freq: Once | INTRAMUSCULAR | Status: AC
  Administered 2023-09-17: 4 mg via INTRAVENOUS
  Filled 2023-09-17: qty 2

## 2023-09-17 MED ORDER — KCL-LACTATED RINGERS-D5W 20 MEQ/L IV SOLN
INTRAVENOUS | Status: DC
  Filled 2023-09-17 (×2): qty 1000

## 2023-09-17 MED ORDER — ACETAMINOPHEN 650 MG RE SUPP: 650.0000 mg | Freq: Four times a day (QID) | RECTAL | Status: DC | PRN

## 2023-09-17 MED ORDER — SODIUM CHLORIDE 0.9 % IV SOLN
2.0000 g | INTRAVENOUS | Status: DC
  Administered 2023-09-18: 2 g via INTRAVENOUS
  Filled 2023-09-17: qty 20

## 2023-09-17 MED ORDER — SODIUM CHLORIDE 0.9 % IV SOLN
2.0000 g | Freq: Once | INTRAVENOUS | Status: AC
  Administered 2023-09-17: 2 g via INTRAVENOUS
  Filled 2023-09-17: qty 20

## 2023-09-17 MED ORDER — ACETAMINOPHEN 325 MG PO TABS
650.0000 mg | ORAL_TABLET | Freq: Four times a day (QID) | ORAL | Status: DC | PRN
  Administered 2023-09-18 – 2023-10-04 (×11): 650 mg via ORAL
  Filled 2023-09-17 (×12): qty 2

## 2023-09-17 MED ORDER — ACETAMINOPHEN 500 MG PO TABS
1000.0000 mg | ORAL_TABLET | Freq: Once | ORAL | Status: AC
  Administered 2023-09-17: 1000 mg via ORAL
  Filled 2023-09-17: qty 2

## 2023-09-17 MED ORDER — POLYETHYLENE GLYCOL 3350 17 G PO PACK: 17.0000 g | PACK | Freq: Every day | ORAL | Status: DC | PRN

## 2023-09-17 MED ORDER — METOCLOPRAMIDE HCL 5 MG PO TABS
5.0000 mg | ORAL_TABLET | Freq: Three times a day (TID) | ORAL | Status: DC
  Administered 2023-09-18 (×2): 5 mg via ORAL
  Filled 2023-09-17 (×2): qty 1

## 2023-09-17 MED ORDER — SODIUM CHLORIDE 0.9% FLUSH
3.0000 mL | Freq: Two times a day (BID) | INTRAVENOUS | Status: DC
  Administered 2023-09-18 – 2023-10-04 (×31): 3 mL via INTRAVENOUS

## 2023-09-17 MED ORDER — POTASSIUM CHLORIDE CRYS ER 20 MEQ PO TBCR
40.0000 meq | EXTENDED_RELEASE_TABLET | Freq: Once | ORAL | Status: AC
  Administered 2023-09-17: 40 meq via ORAL
  Filled 2023-09-17: qty 2

## 2023-09-17 MED ORDER — PANTOPRAZOLE SODIUM 20 MG PO TBEC: 20.0000 mg | DELAYED_RELEASE_TABLET | Freq: Every day | ORAL | Status: DC

## 2023-09-17 NOTE — ED Notes (Signed)
MD notified about fever

## 2023-09-17 NOTE — Assessment & Plan Note (Signed)
Patient was noted to have incidental fever in the ER, patient's Foley catheter was changed and patient UA is suggestive of urinary tract infection.  Blood cultures and urine cultures have already been sent.  S/p ceftriaxone.  Continue with same.  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

## 2023-09-17 NOTE — ED Notes (Signed)
Patient transported to CT 

## 2023-09-17 NOTE — H&P (Incomplete)
History and Physical    Patient: Bruce Little WNU:[id number] DOB: 07-17-1971 DOA: 09/17/2023 DOS: the patient was seen and examined on 09/17/2023 PCP: Bruce Hummer, MD  Patient coming from: Home  Chief Complaint:  Chief Complaint  Patient presents with  . Nausea  . Emesis  . GI tube clooged  . UTI   HPI: [name] is a 52 y.o. male with medical history significant of chronic diabetes mellitus, CKD, now on dialysis, left below-knee amputation.  Patient apparently was on vacation in June in [place] when he had hemorrhagic stroke.  I have limited access to the records from the [facility] health care.  It is not entirely clear to me as to what kind of hemorrhage this was.  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  Patient had a tracheostomy done at the time which has since then been reversed.  Patient also had a G-tube placed.  Patient's baseline functional status is really of requiring total care. Left hemiplegia. Patient is often awake, can follow directions but most of his speech is about what position he would be comfortable in.  Patient has since then been at various rehab facilities including in [place].  History is obtained from patient's wife at the bedside, record review as well as ER doc signout.  Since patient's transferred to [facility] back in August, patient has had 3 or 4 episodes of vomiting daily of ingested food.  Which has really not responded to various measures that have been tried before.  Patient has been at home for the last couple of weeks and his vomiting has persisted.  This was the principal reason that the patient is brought to the ER today at [facility].  There is no report of patient having any diarrhea.  There was no report of patient having any fever at home.  Patient is unable to offer limited additional detail to this  because of his limited speech.  I assume patient does not have any pain.  Patient was brought to the ER and was noted to have a temperature of 100.7.  Reports that patient had documented multiple episodes of fevers at various of his previous facilities that were attributed to neurogenic fevers.  Patient's Foley catheter has been changed to where new fracture UA shows that the patient may have a UTI, patient has been started on IV antibiotic therapy.  Medical evaluation is sought.  Patient has had apparently 3 episodes of vomiting per the wife at the bedside in the ER.  Medical evaluation is sought Review of Systems: unable to review all systems due to the inability of the patient to answer questions. Past Medical History:  Diagnosis Date  . A-fib (HCC)   . Anemia    low iron  . Chronic kidney disease   . COVID    has had it 2 times, one mild and one wasn't  . DM2 (diabetes mellitus, type 2) (HCC)   . History of blood transfusion   . HTN (hypertension)   . Osteomyelitis of fifth toe of left foot (HCC) 08/13/2021  . Osteomyelitis of fourth toe of left foot (HCC) 08/13/2021  . Pneumonia   . Stroke (HCC) 05/19/2021   no residual effects.  . Stroke [facility])    Past Surgical History:  Procedure Laterality Date  . AMPUTATION Left 08/29/2021   Procedure: AMPUTATION OF FOURTH TOE AND RAY  ALONG WITH REMAINING FITH METATARSAL;  Surgeon: Tarry Kos, MD;  Location: [facility] OR;  Service: Orthopedics;  Laterality: Left;  . AMPUTATION Left 09/03/2021   Procedure: LISFRANC AMPUTATION;  Surgeon: Tarry Kos, MD;  Location: [facility] OR;  Service: Orthopedics;  Laterality: Left;  . AMPUTATION Left 10/29/2021   Procedure: AMPUTATION BELOW KNEE -LEFT;  Surgeon: Tarry Kos, MD;  Location: [facility] OR;  Service: Orthopedics;  Laterality: Left;  . APPLICATION OF WOUND VAC Left 07/07/2021   Procedure: APPLICATION OF WOUND VAC;  Surgeon: Tarry Kos, MD;  Location: [facility] OR;  Service: Orthopedics;  Laterality: Left;  .  APPLICATION OF WOUND VAC Left 08/29/2021   Procedure: APPLICATION OF WOUND VAC;  Surgeon: Tarry Kos, MD;  Location: [facility] OR;  Service: Orthopedics;  Laterality: Left;  . I & D EXTREMITY Left 07/03/2021   Procedure: IRRIGATION AND DEBRIDEMENT ,FIFTH RAY  AMPUTATION LEFT FOOT, , WOUND VAC PLACEMENT;  Surgeon: Tarry Kos, MD;  Location: [facility] OR;  Service: Orthopedics;  Laterality: Left;  . I & D EXTREMITY Left 07/07/2021   Procedure: IRRIGATION AND DEBRIDEMENT LEFT FOOT;  Surgeon: Tarry Kos, MD;  Location: [facility] OR;  Service: Orthopedics;  Laterality: Left;  . I & D EXTREMITY Left 08/29/2021   Procedure: IRRIGATION AND DEBRIDEMENT LEFT FOOT;  Surgeon: Tarry Kos, MD;  Location: [facility] OR;  Service: Orthopedics;  Laterality: Left;  . IR FLUORO GUIDE CV LINE RIGHT  07/09/2021  . IR REMOVAL TUN CV CATH W/O FL  10/08/2021  . IR REMOVAL TUN CV CATH W/O FL  07/13/2023  . IR US GUIDE VASC ACCESS RIGHT  07/09/2021  . VITRECTOMY Left    [place] eye   Social History:  reports that he has never smoked. He has never used smokeless tobacco. He reports that he does not currently use alcohol. He reports that he does not use drugs.  No Known Allergies  Family History  Problem Relation Age of Onset  . Stroke Mother   . Cancer Mother   . Heart disease Father     Prior to Admission medications   Medication Sig Start Date End Date Taking? Authorizing Provider  albuterol (VENTOLIN HFA) 108 (90 Base) MCG/ACT inhaler Inhale 2 puffs into the lungs every 4 (four) hours as needed. 09/15/23  Yes [provider]  amiodarone (PACERONE) 100 MG tablet Take 100 mg by mouth daily.   Yes [provider]  amLODipine (NORVASC) 10 MG tablet Take 10 mg by mouth daily.   Yes [provider]  atorvastatin (LIPITOR) 40 MG tablet Take 40 mg by mouth at bedtime.   Yes [provider]  carvedilol (COREG) 12.5 MG tablet Take 37.5 mg by mouth 2 (two) times daily with a meal.   Yes [provider]  cloNIDine (CATAPRES) 0.1 MG tablet Take 0.3 mg by mouth 3 (three) times daily.   Yes [provider]  finasteride (PROSCAR) 5 MG tablet Take 5 mg by mouth daily.   Yes [provider]  furosemide (LASIX) 40 MG tablet Take 40 mg by mouth daily.   Yes [provider]  hydrALAZINE (APRESOLINE) 25 MG tablet Take 25 mg by mouth every 8 (eight) hours.   Yes [provider]  LANTUS SOLOSTAR 100 UNIT/ML Solostar Pen Inject 5 Units into the skin every 12 (twelve) hours. 08/31/23  Yes [provider]  levETIRAcetam (KEPPRA) 750 MG tablet Take 1,500 mg by mouth 2 (two) times daily.   Yes  [provider]  losartan (COZAAR) 100 MG tablet Take 100 mg by mouth daily.   Yes [provider]  Melatonin 10 MG TABS Take 1 tablet by mouth at bedtime.   Yes [provider]  ondansetron (ZOFRAN) 4 MG tablet Take 1 tablet (4 mg total) by mouth every 8 (eight) hours as needed for nausea or vomiting. 10/08/21  Yes Cristie Hem, PA-C  pantoprazole (PROTONIX) 40 MG tablet Take 1 tablet (40 mg total) by mouth daily. Patient taking differently: Take 40 mg by mouth 2 (two) times daily. 05/22/21  Yes Bruce, Omair Latif, DO  PARoxetine (PAXIL) 20 MG tablet Take 10 mg by mouth daily.   Yes [provider]  terazosin (HYTRIN) 1 MG capsule Take 1 mg by mouth at bedtime.   Yes [provider]  atorvastatin (LIPITOR) 80 MG tablet Take 1 tablet (80 mg total) by mouth every evening. Patient not taking: Reported on 09/17/2023 07/31/21   Patwardhan, Anabel Bene, MD  glipiZIDE (GLUCOTROL) 5 MG tablet Take 1 tablet (5 mg total) by mouth daily. 05/21/21 05/21/22  Merlene Laughter, DO    Physical Exam: Vitals:   09/17/23 1800 09/17/23 1957 09/17/23 2100 09/17/23 2111  BP:    (!) 102/91  Pulse:    70  Resp:    20  Temp: (!) 100.7 F (38.2 C) 99.1 F (37.3 C) (!) 100.4 F (38 C)   TempSrc: Oral Oral Rectal   SpO2:    94%    General: Patient is awake, makes eye contact, nods.  However not reliably so.  Does not verbalize anything to me.  Wife states that this may be because the patient is tired at the movement.  Per wife patient does asked to be repositioned from the wife. Respiratory exam: Bilateral intravesicular Cardiovascular exam S1-S2 normal Abdomen all quadrants are soft, no tenderness is elicited G-tube in situ Extremities warm without edema.  Left hemiplegia.  Patient follows directions with the right upper and lower extremity however very feebly.    Media Information  Document Information  Photos    09/17/2023 20:52  Attached To:  Hospital Encounter on 09/17/23  Source Information  Karmen Stabs, MD  [facility]  Document History    Data Reviewed: {Tip this will not be part of the note when signed- Document your independent interpretation of telemetry tracing, EKG, lab, Radiology test or any other diagnostic tests. Add any new diagnostic test ordered today. (Optional):[id number]} Labs on Admission:  Results for orders placed or performed during the hospital encounter of 09/17/23 (from the past 24 hour(s))  Urinalysis, Routine w reflex microscopic -Urine, Random     Status: Abnormal   Collection Time: 09/17/23 10:04 AM  Result Value Ref Range   Color, Urine YELLOW YELLOW   APPearance CLOUDY (A) CLEAR   Specific Gravity, Urine 1.025 1.005 - 1.030   pH 8.0 5.0 - 8.0   Glucose, UA 50 (A) NEGATIVE mg/dL   Hgb urine dipstick MODERATE (A) NEGATIVE   Bilirubin Urine NEGATIVE NEGATIVE   Ketones, ur NEGATIVE NEGATIVE mg/dL   Protein, ur 161 (A) NEGATIVE mg/dL   Nitrite NEGATIVE NEGATIVE   Leukocytes,Ua LARGE (A) NEGATIVE   RBC / HPF >50 0 - 5 RBC/hpf   WBC, UA >50 0 - 5 WBC/hpf   Bacteria, UA RARE (A) NONE SEEN   Squamous Epithelial / HPF 0-5 0 - 5 /HPF   WBC Clumps PRESENT   Lipase, blood     Status: None  Collection Time: 09/17/23 10:06 AM  Result Value Ref Range   Lipase 43 11 - 51  U/L  Comprehensive metabolic panel     Status: Abnormal   Collection Time: 09/17/23 10:06 AM  Result Value Ref Range   Sodium 132 (L) 135 - 145 mmol/L   Potassium 3.1 (L) 3.5 - 5.1 mmol/L   Chloride 96 (L) 98 - 111 mmol/L   CO2 27 22 - 32 mmol/L   Glucose, Bld 142 (H) 70 - 99 mg/dL   BUN 9 6 - 20 mg/dL   Creatinine, Ser 6.04 (H) 0.61 - 1.24 mg/dL   Calcium 7.8 (L) 8.9 - 10.3 mg/dL   Total Protein 5.4 (L) 6.5 - 8.1 g/dL   Albumin 1.6 (L) 3.5 - 5.0 g/dL   AST 27 15 - 41 U/L   ALT 30 0 - 44 U/L   Alkaline Phosphatase 106 38 - 126 U/L   Total Bilirubin 0.8 <1.2 mg/dL   GFR, Estimated 49 (L) >60 mL/min   Anion gap 9 5 - 15  CBC     Status: Abnormal   Collection Time: 09/17/23 10:06 AM  Result Value Ref Range   WBC 20.4 (H) 4.0 - 10.5 K/uL   RBC 2.57 (L) 4.22 - 5.81 MIL/uL   Hemoglobin 7.9 (L) 13.0 - 17.0 g/dL   HCT 54.0 (L) 98.1 - 19.1 %   MCV 94.6 80.0 - 100.0 fL   MCH 30.7 26.0 - 34.0 pg   MCHC 32.5 30.0 - 36.0 g/dL   RDW 47.8 (H) 29.5 - 62.1 %   Platelets 276 150 - 400 K/uL   nRBC 0.0 0.0 - 0.2 %   Basic Metabolic Panel: Recent Labs  Lab 09/17/23 1006  NA 132*  K 3.1*  CL 96*  CO2 27  GLUCOSE 142*  BUN 9  CREATININE 1.69*  CALCIUM 7.8*   Liver Function Tests: Recent Labs  Lab 09/17/23 1006  AST 27  ALT 30  ALKPHOS 106  BILITOT 0.8  PROT 5.4*  ALBUMIN 1.6*   Recent Labs  Lab 09/17/23 1006  LIPASE 43   No results for input(s): "AMMONIA" in the last 168 hours. CBC: Recent Labs  Lab 09/17/23 1006  WBC 20.4*  HGB 7.9*  HCT 24.3*  MCV 94.6  PLT 276   Cardiac Enzymes: No results for input(s): "CKTOTAL", "CKMB", "CKMBINDEX", "TROPONINIHS" in the last 168 hours.  BNP (last 3 results) No results for input(s): "PROBNP" in the last 8760 hours. CBG: No results for input(s): "GLUCAP" in the last 168 hours.  Radiological Exams on Admission:  CT ABDOMEN PELVIS W CONTRAST  Result Date: 09/17/2023 CLINICAL DATA:  Nausea, vomiting, possible UTI.  GI  tube clogged EXAM: CT ABDOMEN AND PELVIS WITH CONTRAST TECHNIQUE: Multidetector CT imaging of the abdomen and pelvis was performed using the standard protocol following bolus administration of intravenous contrast. RADIATION DOSE REDUCTION: This exam was performed according to the departmental dose-optimization program which includes automated exposure control, adjustment of the mA and/or kV according to patient size and/or use of iterative reconstruction technique. CONTRAST:  75mL OMNIPAQUE IOHEXOL 350 MG/ML SOLN COMPARISON:  Renal ultrasound 08/04/2023 and radiographs 08/02/2023 FINDINGS: Lower chest: Small left-greater-than-right pleural effusions and associated atelectasis. Hepatobiliary: Mild distention of the gallbladder. Hyperdense layering sludge in the gallbladder. No biliary dilation. Liver is unremarkable. Pancreas: Unremarkable. Spleen: Unremarkable. Adrenals/Urinary Tract: Unremarkable adrenal glands. No urinary calculi or hydronephrosis. Foley catheter in the nondistended bladder. Stomach/Bowel: Normal caliber large and small bowel. Moderate colonic stool  load. Normal appendix. Stomach is within normal limits. Expected location of the percutaneous gastrostomy tube in the stomach. Vascular/Lymphatic: Aortic atherosclerosis. No enlarged abdominal or pelvic lymph nodes. Reproductive: No acute abnormality. Other: Smaller free fluid in the pelvis. No free intraperitoneal air. Musculoskeletal: Resorption of the inferior sacrum and coccyx compatible with osteomyelitis. There is a large sacral decubitus ulcer measuring 7.4 x 6.6 x 3.9 cm and abutting the inferior portion of the remaining sacrum. Adjacent fat stranding and subcutaneous and presacral edema. No abscess. No soft tissue gas. IMPRESSION: 1. Large sacral decubitus ulcer with resorption of the inferior sacrum and coccyx. No abscess. 2. Small left-greater-than-right pleural effusions and associated atelectasis. 3. Expected location of the percutaneous  gastrostomy tube in the stomach. 4. Foley catheter in the nondistended bladder. Aortic Atherosclerosis (ICD10-I70.0). Electronically Signed   By: Minerva Fester M.D.   On: 09/17/2023 20:35    EKG: Independently reviewed. NSR   Assessment and Plan: * Vomiting Wife this has been going on since approximately August when patient was transition to [place].  Happens every 2-3 times a day.  Principally consists of ingested food.  This has caused patient to have malnutrition per the wife.  Will request a nutrition evaluation to see if we can unclog patient's G-tube and idea of patient's nutritional status.  With regards to etiology of patient's vomiting, patient has prior known diabetes, therefore I will trial Reglan.  I will check stool for H. pylori to make sure were not dealing with infectious etiology.  Will start with clear liquid diet for now.  May need a GI evaluation if persistent issue.  Sacral decubitus ulcer Does not appear to be infected at this time, however it is certainly a source for transient bacterial translocation and highly susceptible to stool contamination.  Will request wound care evaluation and position patient every 2 hourly.  Leukocytosis Patient seems to have a leukocytosis of 20.4 K, some of this is chronic but it is indeed worse than previously baseline documented labs in the chart.  I would want to see the differential for this, will order this tomorrow morning.  I will also check a sed rate given the persistent of leukocytosis.  Some of this may be due to urinary tract infection.  Another site of inflammation for the patient is patient's a sacral decubitus, with concern of frequent contamination possibly from stool.  I will require input of the wound care team in this regard and see if patient would benefit from diversion colostomy.  UTI (urinary tract infection) Patient was noted to have incidental fever in the ER, patient's Foley catheter was changed and patient UA is  suggestive of urinary tract infection.  Blood cultures and urine cultures have already been sent.  S/p ceftriaxone.  Continue with same.  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  Renal insufficiency I am advised by the wife that the patient is on chronic hemodialysis Tuesday Thursday Saturday.  However note that the patient has Foley and urine output seems to be pretty brisk.  Creatinine is only 1.69.  And BUN only 9.  I am not sure if patient merits ongoing hemodialysis.  Dr. Allena Katz of nephrology has been consulted by the ER attending, we will follow-up her recommendation in this regard      Advance Care Planning:   Code Status: Full Code discussed with wife.  Consults: Renal engaged by ER provider.  Family Communication: discusesd with wife at bedside.  Severity of Illness: The appropriate patient status for this patient is INPATIENT. Inpatient status is judged to be reasonable and necessary in order to provide the required intensity of service to ensure the patient's safety. The patient's presenting symptoms, physical exam findings, and initial radiographic and laboratory data in the context of their chronic comorbidities is felt to place them at high risk for further clinical deterioration. Furthermore, it is not anticipated that the patient will be medically stable for discharge from the hospital within 2 midnights of admission.   * I certify that at the point of admission it is my clinical judgment that the patient will require inpatient hospital care spanning beyond 2 midnights from the point of admission due to high intensity of service, high risk for further deterioration and high frequency of surveillance required.*  Author: Nolberto Hanlon, MD 09/17/2023 11:41 PM  For on call review [email or web address].

## 2023-09-17 NOTE — H&P (Addendum)
History and Physical    Patient: Bruce Little XBM:[id number] DOB: [birth year] DOA: 09/17/2023 DOS: the patient was seen and examined on 09/17/2023 PCP: Westley Hummer, MD  Patient coming from: Home  Chief Complaint:  Chief Complaint  Patient presents with   Nausea   Emesis   GI tube clooged   UTI   HPI: Bruce Little is a 52 y.o. male with medical history significant of chronic diabetes mellitus, CKD, now on dialysis, left below-knee amputation.  Patient apparently was on vacation in June in [place] when he had hemorrhagic stroke.  I have limited access to the records from the [facility] health care.  It is not entirely clear to me as to what kind of hemorrhage this was.  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  Patient had a tracheostomy done at the time which has since then been reversed.  Patient also had a G-tube placed.  Patient's baseline functional status is really of requiring total care. Left hemiplegia. Patient is often awake, can follow directions but most of his speech is about what position he would be comfortable in.  Patient has since then been at various rehab facilities including in [place].  History is obtained from patient's wife at the bedside, record review as well as ER doc signout.  Since patient's transferred to [facility] back in August, patient has had 3 or 4 episodes of vomiting daily of ingested food.  Which has really not responded to various measures that have been tried before.  Patient has been at home for the last couple of weeks and his vomiting has persisted.  This was the principal reason that the patient is brought to the ER today at [facility].  There is no report of patient having any diarrhea.  There was no report of patient having any fever at home.  Patient is unable to offer limited additional detail to this because of  his limited speech.  I assume patient does not have any pain.  Patient was brought to the ER and was noted to have a temperature of 100.7.  Reports that patient had documented multiple episodes of fevers at various of his previous facilities that were attributed to neurogenic fevers.  Patient's Foley catheter has been changed to where new fracture UA shows that the patient may have a UTI, patient has been started on IV antibiotic therapy.  Medical evaluation is sought.  Patient has had apparently 3 episodes of vomiting per the wife at the bedside in the ER.  Medical evaluation is sought Review of Systems: unable to review all systems due to the inability of the patient to answer questions. Past Medical History:  Diagnosis Date   A-fib (HCC)    Anemia    low iron   Chronic kidney disease    COVID    has had it 2 times, one mild and one wasn't   DM2 (diabetes mellitus, type 2) (HCC)    History of blood transfusion    HTN (hypertension)    Osteomyelitis of fifth toe of left foot (HCC) 08/13/2021   Osteomyelitis of fourth toe of left foot (HCC) 08/13/2021   Pneumonia    Stroke (HCC) 05/19/2021   no residual effects.   Stroke [facility])    Past Surgical History:  Procedure Laterality Date   AMPUTATION Left 08/29/2021   Procedure: AMPUTATION OF FOURTH TOE AND RAY  ALONG WITH REMAINING FITH METATARSAL;  Surgeon: Tarry Kos, MD;  Location: [facility] OR;  Service: Orthopedics;  Laterality: Left;   AMPUTATION Left 09/03/2021   Procedure: LISFRANC AMPUTATION;  Surgeon: Tarry Kos, MD;  Location: [facility] OR;  Service: Orthopedics;  Laterality: Left;   AMPUTATION Left 10/29/2021   Procedure: AMPUTATION BELOW KNEE -LEFT;  Surgeon: Tarry Kos, MD;  Location: [facility] OR;  Service: Orthopedics;  Laterality: Left;   APPLICATION OF WOUND VAC Left 07/07/2021   Procedure: APPLICATION OF WOUND VAC;  Surgeon: Tarry Kos, MD;  Location: [facility] OR;  Service: Orthopedics;  Laterality: Left;   APPLICATION OF WOUND VAC Left  08/29/2021   Procedure: APPLICATION OF WOUND VAC;  Surgeon: Tarry Kos, MD;  Location: [facility] OR;  Service: Orthopedics;  Laterality: Left;   I & D EXTREMITY Left 07/03/2021   Procedure: IRRIGATION AND DEBRIDEMENT ,FIFTH RAY  AMPUTATION LEFT FOOT, , WOUND VAC PLACEMENT;  Surgeon: Tarry Kos, MD;  Location: [facility] OR;  Service: Orthopedics;  Laterality: Left;   I & D EXTREMITY Left 07/07/2021   Procedure: IRRIGATION AND DEBRIDEMENT LEFT FOOT;  Surgeon: Tarry Kos, MD;  Location: [facility] OR;  Service: Orthopedics;  Laterality: Left;   I & D EXTREMITY Left 08/29/2021   Procedure: IRRIGATION AND DEBRIDEMENT LEFT FOOT;  Surgeon: Tarry Kos, MD;  Location: [facility] OR;  Service: Orthopedics;  Laterality: Left;   IR FLUORO GUIDE CV LINE RIGHT  07/09/2021   IR REMOVAL TUN CV CATH W/O FL  10/08/2021   IR REMOVAL TUN CV CATH W/O FL  07/13/2023   IR US GUIDE VASC ACCESS RIGHT  07/09/2021   VITRECTOMY Left     eye   Social History:  reports that he has never smoked. He has never used smokeless tobacco. He reports that he does not currently use alcohol. He reports that he does not use drugs.  No Known Allergies  Family History  Problem Relation Age of Onset   Stroke Mother    Cancer Mother    Heart disease Father     Prior to Admission medications   Medication Sig Start Date End Date Taking? Authorizing Provider  albuterol (VENTOLIN HFA) 108 (90 Base) MCG/ACT inhaler Inhale 2 puffs into the lungs every 4 (four) hours as needed. 09/15/23  Yes [provider]  amiodarone (PACERONE) 100 MG tablet Take 100 mg by mouth daily.   Yes [provider]  amLODipine (NORVASC) 10 MG tablet Take 10 mg by mouth daily.   Yes [provider]  atorvastatin (LIPITOR) 40 MG tablet Take 40 mg by mouth at bedtime.   Yes [provider]  carvedilol (COREG) 12.5 MG tablet Take 37.5 mg by mouth 2 (two) times daily with a meal.   Yes [provider]  cloNIDine (CATAPRES) 0.1 MG  tablet Take 0.3 mg by mouth 3 (three) times daily.   Yes [provider]  finasteride (PROSCAR) 5 MG tablet Take 5 mg by mouth daily.   Yes [provider]  furosemide (LASIX) 40 MG tablet Take 40 mg by mouth daily.   Yes [provider]  hydrALAZINE (APRESOLINE) 25 MG tablet Take 25 mg by mouth every 8 (eight) hours.   Yes [provider]  LANTUS SOLOSTAR 100 UNIT/ML Solostar Pen Inject 5 Units into the skin every 12 (twelve) hours. 08/31/23  Yes [provider]  levETIRAcetam (KEPPRA) 750 MG tablet Take 1,500 mg by mouth 2 (two) times daily.   Yes  [provider]  losartan (COZAAR) 100 MG tablet Take 100 mg by mouth daily.   Yes [provider]  Melatonin 10 MG TABS Take 1 tablet by mouth at bedtime.   Yes [provider]  ondansetron (ZOFRAN) 4 MG tablet Take 1 tablet (4 mg total) by mouth every 8 (eight) hours as needed for nausea or vomiting. 10/08/21  Yes Cristie Hem, PA-C  pantoprazole (PROTONIX) 40 MG tablet Take 1 tablet (40 mg total) by mouth daily. Patient taking differently: Take 40 mg by mouth 2 (two) times daily. 05/22/21  Yes Sheikh, Omair Latif, DO  PARoxetine (PAXIL) 20 MG tablet Take 10 mg by mouth daily.   Yes [provider]  terazosin (HYTRIN) 1 MG capsule Take 1 mg by mouth at bedtime.   Yes [provider]  atorvastatin (LIPITOR) 80 MG tablet Take 1 tablet (80 mg total) by mouth every evening. Patient not taking: Reported on 09/17/2023 07/31/21   Patwardhan, Anabel Bene, MD  glipiZIDE (GLUCOTROL) 5 MG tablet Take 1 tablet (5 mg total) by mouth daily. 05/21/21 05/21/22  Merlene Laughter, DO    Physical Exam: Vitals:   09/17/23 1800 09/17/23 1957 09/17/23 2100 09/17/23 2111  BP:    (!) 102/91  Pulse:    70  Resp:    20  Temp: (!) 100.7 F (38.2 C) 99.1 F (37.3 C) (!) 100.4 F (38 C)   TempSrc: Oral Oral Rectal   SpO2:    94%   General: Patient is awake, makes eye contact,  nods.  However not reliably so.  Does not verbalize anything to me.  Wife states that this may be because the patient is tired at the movement.  Per wife patient does asked to be repositioned from the wife. Respiratory exam: Bilateral intravesicular Cardiovascular exam S1-S2 normal Abdomen all quadrants are soft, no tenderness is elicited G-tube in situ Extremities warm without edema.  Left hemiplegia.  Patient follows directions with the right upper and lower extremity however very feebly.    Media Information  Document Information  Photos    09/17/2023 20:52  Attached To:  Hospital Encounter on 09/17/23  Source Information  Karmen Stabs, MD  [facility]  Document History    Data Reviewed:  Labs on Admission:  Results for orders placed or performed during the hospital encounter of 09/17/23 (from the past 24 hour(s))  Urinalysis, Routine w reflex microscopic -Urine, Random     Status: Abnormal   Collection Time: 09/17/23 10:04 AM  Result Value Ref Range   Color, Urine YELLOW YELLOW   APPearance CLOUDY (A) CLEAR   Specific Gravity, Urine 1.025 1.005 - 1.030   pH 8.0 5.0 - 8.0   Glucose, UA 50 (A) NEGATIVE mg/dL   Hgb urine dipstick MODERATE (A) NEGATIVE   Bilirubin Urine NEGATIVE NEGATIVE   Ketones, ur NEGATIVE NEGATIVE mg/dL   Protein, ur 416 (A) NEGATIVE mg/dL   Nitrite NEGATIVE NEGATIVE   Leukocytes,Ua LARGE (A) NEGATIVE   RBC / HPF >50 0 - 5 RBC/hpf   WBC, UA >50 0 - 5 WBC/hpf   Bacteria, UA RARE (A) NONE SEEN   Squamous Epithelial / HPF 0-5 0 - 5 /HPF   WBC Clumps PRESENT   Lipase, blood     Status: None   Collection Time: 09/17/23 10:06 AM  Result Value Ref Range   Lipase 43 11 - 51 U/L  Comprehensive metabolic panel     Status: Abnormal   Collection Time: 09/17/23  10:06 AM  Result Value Ref Range   Sodium 132 (L) 135 - 145 mmol/L   Potassium 3.1 (L) 3.5 - 5.1 mmol/L   Chloride 96 (L) 98 - 111 mmol/L   CO2 27 22 - 32 mmol/L   Glucose, Bld 142 (H) 70 -  99 mg/dL   BUN 9 6 - 20 mg/dL   Creatinine, Ser 1.61 (H) 0.61 - 1.24 mg/dL   Calcium 7.8 (L) 8.9 - 10.3 mg/dL   Total Protein 5.4 (L) 6.5 - 8.1 g/dL   Albumin 1.6 (L) 3.5 - 5.0 g/dL   AST 27 15 - 41 U/L   ALT 30 0 - 44 U/L   Alkaline Phosphatase 106 38 - 126 U/L   Total Bilirubin 0.8 <1.2 mg/dL   GFR, Estimated 49 (L) >60 mL/min   Anion gap 9 5 - 15  CBC     Status: Abnormal   Collection Time: 09/17/23 10:06 AM  Result Value Ref Range   WBC 20.4 (H) 4.0 - 10.5 K/uL   RBC 2.57 (L) 4.22 - 5.81 MIL/uL   Hemoglobin 7.9 (L) 13.0 - 17.0 g/dL   HCT 09.6 (L) 04.5 - 40.9 %   MCV 94.6 80.0 - 100.0 fL   MCH 30.7 26.0 - 34.0 pg   MCHC 32.5 30.0 - 36.0 g/dL   RDW 81.1 (H) 91.4 - 78.2 %   Platelets 276 150 - 400 K/uL   nRBC 0.0 0.0 - 0.2 %   Basic Metabolic Panel: Recent Labs  Lab 09/17/23 1006  NA 132*  K 3.1*  CL 96*  CO2 27  GLUCOSE 142*  BUN 9  CREATININE 1.69*  CALCIUM 7.8*   Liver Function Tests: Recent Labs  Lab 09/17/23 1006  AST 27  ALT 30  ALKPHOS 106  BILITOT 0.8  PROT 5.4*  ALBUMIN 1.6*   Recent Labs  Lab 09/17/23 1006  LIPASE 43   No results for input(s): "AMMONIA" in the last 168 hours. CBC: Recent Labs  Lab 09/17/23 1006  WBC 20.4*  HGB 7.9*  HCT 24.3*  MCV 94.6  PLT 276   Cardiac Enzymes: No results for input(s): "CKTOTAL", "CKMB", "CKMBINDEX", "TROPONINIHS" in the last 168 hours.  BNP (last 3 results) No results for input(s): "PROBNP" in the last 8760 hours. CBG: No results for input(s): "GLUCAP" in the last 168 hours.  Radiological Exams on Admission:  CT ABDOMEN PELVIS W CONTRAST  Result Date: 09/17/2023 CLINICAL DATA:  Nausea, vomiting, possible UTI.  GI tube clogged EXAM: CT ABDOMEN AND PELVIS WITH CONTRAST TECHNIQUE: Multidetector CT imaging of the abdomen and pelvis was performed using the standard protocol following bolus administration of intravenous contrast. RADIATION DOSE REDUCTION: This exam was performed according to the  departmental dose-optimization program which includes automated exposure control, adjustment of the mA and/or kV according to patient size and/or use of iterative reconstruction technique. CONTRAST:  75mL OMNIPAQUE IOHEXOL 350 MG/ML SOLN COMPARISON:  Renal ultrasound 08/04/2023 and radiographs 08/02/2023 FINDINGS: Lower chest: Small left-greater-than-right pleural effusions and associated atelectasis. Hepatobiliary: Mild distention of the gallbladder. Hyperdense layering sludge in the gallbladder. No biliary dilation. Liver is unremarkable. Pancreas: Unremarkable. Spleen: Unremarkable. Adrenals/Urinary Tract: Unremarkable adrenal glands. No urinary calculi or hydronephrosis. Foley catheter in the nondistended bladder. Stomach/Bowel: Normal caliber large and small bowel. Moderate colonic stool load. Normal appendix. Stomach is within normal limits. Expected location of the percutaneous gastrostomy tube in the stomach. Vascular/Lymphatic: Aortic atherosclerosis. No enlarged abdominal or pelvic lymph nodes. Reproductive: No acute abnormality. Other:  Smaller free fluid in the pelvis. No free intraperitoneal air. Musculoskeletal: Resorption of the inferior sacrum and coccyx compatible with osteomyelitis. There is a large sacral decubitus ulcer measuring 7.4 x 6.6 x 3.9 cm and abutting the inferior portion of the remaining sacrum. Adjacent fat stranding and subcutaneous and presacral edema. No abscess. No soft tissue gas. IMPRESSION: 1. Large sacral decubitus ulcer with resorption of the inferior sacrum and coccyx. No abscess. 2. Small left-greater-than-right pleural effusions and associated atelectasis. 3. Expected location of the percutaneous gastrostomy tube in the stomach. 4. Foley catheter in the nondistended bladder. Aortic Atherosclerosis (ICD10-I70.0). Electronically Signed   By: Minerva Fester M.D.   On: 09/17/2023 20:35    EKG: Independently reviewed. NSR   Assessment and Plan: * Vomiting Wife this has  been going on since approximately August when patient was transition to [place].  Happens every 2-3 times a day.  Principally consists of ingested food.  This has caused patient to have malnutrition per the wife.  Will request a nutrition evaluation to see if we can unclog patient's G-tube and idea of patient's nutritional status.  With regards to etiology of patient's vomiting, patient has prior known diabetes, therefore I will trial Reglan.  I will check stool for H. pylori to make sure were not dealing with infectious etiology.  Will start with clear liquid diet for now.  May need a GI evaluation if persistent issue.  ICH (intracerebral hemorrhage) (HCC) Please see HPI, this happened back in June in [place], s/p craniotomy and evacuation of hematoma, s/p treatment of communicating hydrocephalus back in July.  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 C/w keppra at this time.   Elevated ICH may be a potential cause of vomiting. Will  repeat head CT   Sacral decubitus ulcer Does not appear to be infected at this time in terms of soft tissue. But  resorption of the inferior sacrum and coccyx. Is concerning for osteomyelitis.it is certainly a source for transient bacterial translocation and highly susceptible to stool contamination.  Will request wound care evaluation and position patient every 2 hourly. Escalate to surgeon per recommendation of wound service.  Leukocytosis Patient seems to have a leukocytosis of 20.4 K, some of this is chronic but it is indeed worse than previously baseline documented labs in the chart.  I would want to see the differential for this, will order this tomorrow morning.  I will also check a sed rate given the persistent of leukocytosis.  Some of this may be due to urinary tract infection.  Another site of inflammation for the patient is patient's a sacral decubitus, with concern of frequent contamination  possibly from stool.  I will require input of the wound care team in this regard and see if patient would benefit from diversion colostomy.  UTI (urinary tract infection) Patient was noted to have incidental fever in the ER, patient's Foley catheter was changed and patient UA is suggestive of urinary tract infection.  Blood cultures and urine cultures have already been sent.  S/p ceftriaxone.  Continue with same.  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  HTN (hypertension) Patient is on several high doses of her antihypertensive regimen.  Including clonidine 0.3 mg 3 times daily, Coreg 37.5 twice daily, amlodipine 10 mg daily as well as hydralazine 25 3  times daily I will reduce some of these doses here in the hospital and retitrate up if needed.  Because patient's blood pressure is not very high right now.  Renal insufficiency I am advised by the wife that the patient is on chronic hemodialysis Tuesday Thursday Saturday.  However note that the patient has Foley and urine output seems to be pretty brisk.  Creatinine is only 1.69.  And BUN only 9.  I am not sure if patient merits ongoing hemodialysis.  Dr. Allena Katz of nephrology has been consulted by the ER attending, we will follow-up her recommendation in this regard. Hold lasix for now.   DVT prophylaxis: At this time I will hold off on Lovenox because I am concerned about the unprovoked intracranial hemorrhage patient had back in June.  Till we get further documentation of this, I will.  Treat the patient with sequential compression devices.    Advance Care Planning:   Code Status: Full Code discussed with wife.  Consults: Renal engaged by ER provider.  Family Communication: discusesd with wife at bedside.  Severity of Illness: The appropriate patient status for this patient is INPATIENT. Inpatient status is  judged to be reasonable and necessary in order to provide the required intensity of service to ensure the patient's safety. The patient's presenting symptoms, physical exam findings, and initial radiographic and laboratory data in the context of their chronic comorbidities is felt to place them at high risk for further clinical deterioration. Furthermore, it is not anticipated that the patient will be medically stable for discharge from the hospital within 2 midnights of admission.   * I certify that at the point of admission it is my clinical judgment that the patient will require inpatient hospital care spanning beyond 2 midnights from the point of admission due to high intensity of service, high risk for further deterioration and high frequency of surveillance required.*  Author: Nolberto Hanlon, MD 09/17/2023 11:41 PM  For on call review [email or web address].

## 2023-09-17 NOTE — Assessment & Plan Note (Signed)
Patient seems to have a leukocytosis of 20.4 K, some of this is chronic but it is indeed worse than previously baseline documented labs in the chart.  I would want to see the differential for this, will order this tomorrow morning.  I will also check a sed rate given the persistent of leukocytosis.  Some of this may be due to urinary tract infection.  Another site of inflammation for the patient is patient's a sacral decubitus, with concern of frequent contamination possibly from stool.  I will require input of the wound care team in this regard and see if patient would benefit from diversion colostomy.

## 2023-09-17 NOTE — ED Provider Notes (Signed)
[place] EMERGENCY DEPARTMENT AT [facility] Provider Note   CSN: [id number] Arrival date & time: 09/17/23  7829     History  Chief Complaint  Patient presents with   Nausea   Emesis   GI tube clooged   UTI    Bruce Little is a 52 y.o. 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   Vomiting since 2 months ago daily. Randomly and also sometimes after meds/feeds. Takes some feeds by mouth. Has had decreased PO intake. G tube has been clogged for 6 weeks. Has lost weight since stroke, 30 lb, but increased rate recently. G tube not been used for 6 weeks. Patient denies abd pain. G tube was placed 05/14/23 in [place]. He also had a trach at that time, but it was eventually reversed. G tube is unknown size; op note pulled up on wife's phone does not specify size.   Urine sediment changed this week. Patient recently finished abx (doxycycline) 10/28 (11 days ago). Making normal urine output. Foley changed 2 days ago.   Also has mild cough. Temp low grade 99.9 new today. No diarrhea. No chest pain. No ETOH use. No hx kidney stones.    The history is provided by the patient and medical records.       Home Medications Prior to Admission medications   Medication Sig Start Date End Date Taking? Authorizing Provider  albuterol (VENTOLIN HFA) 108 (90 Base) MCG/ACT inhaler Inhale 2 puffs into the lungs every 4 (four) hours as needed. 09/15/23  Yes [provider]  amiodarone (PACERONE) 100 MG tablet Take 100 mg by mouth daily.   Yes [provider]  amLODipine (NORVASC) 10 MG tablet Take 10 mg by mouth daily.   Yes [provider]  atorvastatin (LIPITOR) 40 MG tablet Take 40 mg by mouth at bedtime.   Yes [provider]  carvedilol (COREG) 12.5 MG tablet Take 37.5 mg by mouth 2 (two) times daily with a meal.   Yes [provider]  cloNIDine (CATAPRES) 0.1 MG tablet Take 0.3 mg by mouth 3 (three) times daily.   Yes [provider]  finasteride (PROSCAR) 5 MG tablet Take 5 mg by mouth daily.   Yes [provider]  furosemide (LASIX) 40 MG tablet Take 40 mg by mouth daily.   Yes [provider]  hydrALAZINE (APRESOLINE) 25 MG tablet Take 25 mg by mouth every 8 (eight) hours.   Yes [provider]  LANTUS SOLOSTAR 100 UNIT/ML Solostar Pen Inject 5 Units into the skin every 12 (twelve) hours. 08/31/23  Yes [provider]  levETIRAcetam (KEPPRA) 750 MG tablet Take 1,500 mg by mouth 2 (two) times daily.   Yes [provider]  losartan (COZAAR) 100 MG tablet Take 100 mg by mouth daily.   Yes [provider]  Melatonin 10 MG TABS Take 1 tablet by mouth at bedtime.   Yes [provider]  ondansetron (ZOFRAN) 4 MG tablet Take  1 tablet (4 mg total) by mouth every 8 (eight) hours as needed for nausea or vomiting. 10/08/21  Yes Cristie Hem, PA-C  pantoprazole (PROTONIX) 40 MG tablet Take 1 tablet (40 mg total) by mouth daily. Patient taking differently: Take 40 mg by mouth 2 (two) times daily. 05/22/21  Yes Sheikh, Omair Latif, DO  PARoxetine (PAXIL) 20 MG tablet Take 10 mg by mouth daily.   Yes [provider]  terazosin (HYTRIN) 1 MG capsule Take 1 mg by mouth at bedtime.   Yes [provider]  atorvastatin (LIPITOR) 80 MG tablet Take 1 tablet (80 mg total) by mouth every evening. Patient not taking: Reported on 09/17/2023  07/31/21   Patwardhan, Anabel Bene, MD  glipiZIDE (GLUCOTROL) 5 MG tablet Take 1 tablet (5 mg total) by mouth daily. 05/21/21 05/21/22  Merlene Laughter, DO      Allergies    Patient has no known allergies.    Review of Systems   Review of Systems as per Hpi above  Physical Exam Updated Vital Signs BP 126/65   Pulse 76   Temp (!) 100.4 F (38 C) (Rectal)   Resp (!) 22   SpO2 93%  Physical Exam Constitutional:      Appearance: He is not ill-appearing.  HENT:     Head: Normocephalic and atraumatic.     Nose: Nose normal.     Mouth/Throat:     Mouth: Mucous membranes are moist.  Eyes:     Extraocular Movements: Extraocular movements intact.     Pupils: Pupils are equal, round, and reactive to light.  Cardiovascular:     Rate and Rhythm: Normal rate and regular rhythm.     Pulses: Normal pulses.     Heart sounds: Normal heart sounds.  Pulmonary:     Breath sounds: Normal breath sounds.  Abdominal:     General: There is no distension.     Palpations: Abdomen is soft.     Tenderness: There is abdominal tenderness (complains of diffuse tenderness when palpated). There is no guarding or rebound.     Comments: Clogged G tube in place. Does not have size information  Genitourinary:    Comments: Foley in place cloudy urine Musculoskeletal:     Cervical back: Neck supple.     Right lower leg: No edema.     Comments: S/p LLE BKA  Skin:    General: Skin is warm and dry.  Neurological:     Mental Status: He is alert. Mental status is at baseline.     ED Results / Procedures / Treatments   Labs (all labs ordered are listed, but only abnormal results are displayed) Labs Reviewed  COMPREHENSIVE METABOLIC PANEL - Abnormal; Notable for the following components:      Result Value   Sodium 132 (*)    Potassium 3.1 (*)    Chloride 96 (*)    Glucose, Bld 142 (*)    Creatinine, Ser 1.69 (*)    Calcium 7.8 (*)    Total Protein 5.4 (*)    Albumin 1.6 (*)    GFR, Estimated 49 (*)     All other components within normal limits  CBC - Abnormal; Notable for the following components:   WBC 20.4 (*)    RBC 2.57 (*)    Hemoglobin 7.9 (*)    HCT 24.3 (*)    RDW 15.9 (*)    All other components within normal limits  URINALYSIS, ROUTINE W REFLEX MICROSCOPIC -  Abnormal; Notable for the following components:   APPearance CLOUDY (*)    Glucose, UA 50 (*)    Hgb urine dipstick MODERATE (*)    Protein, ur 100 (*)    Leukocytes,Ua LARGE (*)    Bacteria, UA RARE (*)    All other components within normal limits  URINE CULTURE  CULTURE, BLOOD (ROUTINE X 2)  CULTURE, BLOOD (ROUTINE X 2)  LIPASE, BLOOD    EKG EKG Interpretation Date/Time:  Friday September 17 2023 16:03:34 EST Ventricular Rate:  75 PR Interval:    QRS Duration:  106 QT Interval:  460 QTC Calculation: 514 R Axis:   14  Text Interpretation: Normal sinus rhythm Consider anterior infarct Borderline repolarization abnormality Prolonged QT interval Confirmed by Alvira Monday ([id number]) on 09/17/2023 8:26:33 PM  Radiology CT ABDOMEN PELVIS W CONTRAST  Result Date: 09/17/2023 CLINICAL DATA:  Nausea, vomiting, possible UTI.  GI tube clogged EXAM: CT ABDOMEN AND PELVIS WITH CONTRAST TECHNIQUE: Multidetector CT imaging of the abdomen and pelvis was performed using the standard protocol following bolus administration of intravenous contrast. RADIATION DOSE REDUCTION: This exam was performed according to the departmental dose-optimization program which includes automated exposure control, adjustment of the mA and/or kV according to patient size and/or use of iterative reconstruction technique. CONTRAST:  75mL OMNIPAQUE IOHEXOL 350 MG/ML SOLN COMPARISON:  Renal ultrasound 08/04/2023 and radiographs 08/02/2023 FINDINGS: Lower chest: Small left-greater-than-right pleural effusions and associated atelectasis. Hepatobiliary: Mild distention of the gallbladder. Hyperdense layering sludge in the gallbladder. No biliary dilation.  Liver is unremarkable. Pancreas: Unremarkable. Spleen: Unremarkable. Adrenals/Urinary Tract: Unremarkable adrenal glands. No urinary calculi or hydronephrosis. Foley catheter in the nondistended bladder. Stomach/Bowel: Normal caliber large and small bowel. Moderate colonic stool load. Normal appendix. Stomach is within normal limits. Expected location of the percutaneous gastrostomy tube in the stomach. Vascular/Lymphatic: Aortic atherosclerosis. No enlarged abdominal or pelvic lymph nodes. Reproductive: No acute abnormality. Other: Smaller free fluid in the pelvis. No free intraperitoneal air. Musculoskeletal: Resorption of the inferior sacrum and coccyx compatible with osteomyelitis. There is a large sacral decubitus ulcer measuring 7.4 x 6.6 x 3.9 cm and abutting the inferior portion of the remaining sacrum. Adjacent fat stranding and subcutaneous and presacral edema. No abscess. No soft tissue gas. IMPRESSION: 1. Large sacral decubitus ulcer with resorption of the inferior sacrum and coccyx. No abscess. 2. Small left-greater-than-right pleural effusions and associated atelectasis. 3. Expected location of the percutaneous gastrostomy tube in the stomach. 4. Foley catheter in the nondistended bladder. Aortic Atherosclerosis (ICD10-I70.0). Electronically Signed   By: Minerva Fester M.D.   On: 09/17/2023 20:35    Procedures Procedures    Medications Ordered in ED Medications  potassium chloride SA (KLOR-CON M) CR tablet 40 mEq (40 mEq Oral Given 09/17/23 1549)  ondansetron (ZOFRAN) injection 4 mg (4 mg Intravenous Given 09/17/23 1548)  iohexol (OMNIPAQUE) 350 MG/ML injection 75 mL (75 mLs Intravenous Contrast Given 09/17/23 1710)  acetaminophen (TYLENOL) tablet 1,000 mg (1,000 mg Oral Given 09/17/23 1809)  cefTRIAXone (ROCEPHIN) 2 g in sodium chloride 0.9 % 100 mL IVPB (0 g Intravenous Stopped 09/17/23 2101)    ED Course/ Medical Decision Making/ A&P                                 Medical Decision  Making Amount and/or Complexity of Data Reviewed External Data Reviewed: labs and notes.    Details: Old labs, OSH notes from family member  Labs: ordered. Decision-making details documented in ED Course. Radiology: ordered and independent interpretation performed. Decision-making details documented in ED Course. ECG/medicine tests: ordered and independent interpretation performed. Decision-making details documented in ED Course.  Risk OTC drugs. Prescription drug management. Decision regarding hospitalization.   52 year old gentleman with multiple medical comorbidities including prior CVA, ESRD, recent prolonged hospitalization for stroke, G-tube in place and used as needed who presents to the ED for ongoing vomiting for several months, clogged G-tube, urinary changes in the context of recently finishing doxycycline for UTI, and diffuse abdominal tenderness on exam as well as a low-grade temperature of 99.9.  Differential considered includes abdominal surgical pathology such as appendicitis, diverticulitis, perforated bowel, bowel obstruction, also includes pancreatitis, biliary pathology, hepatitis, UTI or pyelonephritis, kidney stone.  He is hemodynamically stable.  His physical exam is pertinent for diffuse tenderness but no signs of peritonitis.  His G-tube appears clogged unfortunately does not have any sizing information on the outside.  We do not have records from the outside hospital to investigate for sizing.  Family has access to his chart but in the operative note the surgeon does not comment on the size of the G-tube placed on my independent review of his operative note.  Luckily the patient is not G-tube dependent and takes feeds and medicine by mouth.   Labs were obtained and show a rising leukocytosis of 20.4 compared to prior which raises my concern for intra-abdominal surgical process or UTI/pyelonephritis.  His hemoglobin is stable from prior at 7.9.  His renal function is near  prior values; creatinine not super useful as he is ESRD; based on his CMP and absence of signs of volume overload there is not an indication for emergent HD for him.  He is actually hypokalemic to 3.1 which is likely secondary to vomiting and hypochloremic at 96 likely secondary to vomiting as well.  Lipase is normal so this is unlikely to be pancreatitis.  His Foley was exchanged and a urinalysis and urine culture were drawn from the new sterile line. UA shows evidence of UTI with large LE and bacteria. Culture of urine sent, blood cultures added to his workup.   An EKG was obtained to screen for ACS given his age comorbidities and complaints of nausea.  It shows atrial fibrillation at a rate of 75, a normal axis, machine estimates a prolonged QTc interval of 514 but manually calculated it is 431 (normal); there are no overt ischemic changes to the ST segments or T waves. Given absence of chest pain or shortness of breath I have a low suspicion/pretest probability for ACS therefore did not obtain troponins in the context of a reassuring EKG.  After shared decision making with patient and his wife given my concern for intra-abdominal pathology based on his physical exam and leukocytosis and vomiting that is significant enough to cause electrolyte derangements, and after discussion on risks and benefits of potential contrast nephropathy, they have consented to a contrasted CT abdomen pelvis.  This was obtained and on my independent review shows no acute life threatening pathology or explanation for his vomiting. No pyelonephritis seen. They comment on a sacral ulcer. I reassessed the patient and evaluated his ulcer. 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     In the ED he was given IV Zofran, p.o. potassium replacement, tylenol for a fever to 100.7 he developed while here, and Ceftriaxone for UTI.   Given his multiple medical comorbidities he  requires admission for safe  and definitive care for UTI. He also needs to have his G tube issue investigated as it hasn't been used for feeds in several weeks and he may not need it any more. I notified nephrology on call Dr. Allena Katz about patient being admitted as he needs scheduled HD tomorrow (last got it yesterday). He will add him to the schedule/to be seen.   Patient was admitted to hospitalist for further and definitive care.          Final Clinical Impression(s) / ED Diagnoses Final diagnoses:  Urinary tract infection without hematuria, site unspecified  Vomiting, unspecified vomiting type, unspecified whether nausea present    Rx / DC Orders ED Discharge Orders     None         Karmen Stabs, MD 09/17/23 2154    Alvira Monday, MD 09/21/23 1359

## 2023-09-17 NOTE — Assessment & Plan Note (Addendum)
I am advised by the wife that the patient is on chronic hemodialysis Tuesday Thursday Saturday.  However note that the patient has Foley and urine output seems to be pretty brisk.  Creatinine is only 1.69.  And BUN only 9.  I am not sure if patient merits ongoing hemodialysis.  Dr. Allena Katz of nephrology has been consulted by the ER attending, we will follow-up her recommendation in this regard. Hold lasix for now.

## 2023-09-17 NOTE — Assessment & Plan Note (Signed)
Does not appear to be infected at this time, however it is certainly a source for transient bacterial translocation and highly susceptible to stool contamination.  Will request wound care evaluation and position patient every 2 hourly.

## 2023-09-17 NOTE — Assessment & Plan Note (Addendum)
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 C/w keppra at this time.   Elevated ICH may be a potential cause of vomiting. Will  repeat head CT

## 2023-09-17 NOTE — ED Notes (Signed)
RN and NT exchanged foley. 86 F cath placed.

## 2023-09-17 NOTE — ED Notes (Signed)
Wound dressing cleaned and redressed by EDP

## 2023-09-17 NOTE — ED Triage Notes (Addendum)
Wife stated, he had a stroke in June and had a hemorraghic stroke, we were there for 2 months, [facility] for a month , then he went to [facility] in [place] for Rehab. And now home. Left side paralysis. He also has a sacrum wound from the ICU .He has N/V which his GI tube is clogged,this has been going on since he was in [employer].  and has sediments in his urinary bag. So he might have a UTI. His primary care Dr. Rhina Brackett him here.

## 2023-09-17 NOTE — Assessment & Plan Note (Signed)
Wife this has been going on since approximately August when patient was transition to [place].  Happens every 2-3 times a day.  Principally consists of ingested food.  This has caused patient to have malnutrition per the wife.  Will request a nutrition evaluation to see if we can unclog patient's G-tube and idea of patient's nutritional status.  With regards to etiology of patient's vomiting, patient has prior known diabetes, therefore I will trial Reglan.  I will check stool for H. pylori to make sure were not dealing with infectious etiology.  Will start with clear liquid diet for now.  May need a GI evaluation if persistent issue.

## 2023-09-18 ENCOUNTER — Inpatient Hospital Stay (HOSPITAL_COMMUNITY): Payer: BC Managed Care – PPO

## 2023-09-18 DIAGNOSIS — R1110 Vomiting, unspecified: Secondary | ICD-10-CM

## 2023-09-18 DIAGNOSIS — D72829 Elevated white blood cell count, unspecified: Secondary | ICD-10-CM | POA: Diagnosis not present

## 2023-09-18 DIAGNOSIS — N390 Urinary tract infection, site not specified: Secondary | ICD-10-CM

## 2023-09-18 DIAGNOSIS — R509 Fever, unspecified: Secondary | ICD-10-CM

## 2023-09-18 LAB — CBC WITH DIFFERENTIAL/PLATELET
Abs Immature Granulocytes: 0.28 10*3/uL — ABNORMAL HIGH (ref 0.00–0.07)
Basophils Absolute: 0.1 10*3/uL (ref 0.0–0.1)
Basophils Relative: 0 %
Eosinophils Absolute: 0 10*3/uL (ref 0.0–0.5)
Eosinophils Relative: 0 %
HCT: 21.9 % — ABNORMAL LOW (ref 39.0–52.0)
Hemoglobin: 7.1 g/dL — ABNORMAL LOW (ref 13.0–17.0)
Immature Granulocytes: 1 %
Lymphocytes Relative: 6 %
Lymphs Abs: 1.7 10*3/uL (ref 0.7–4.0)
MCH: 30.9 pg (ref 26.0–34.0)
MCHC: 32.4 g/dL (ref 30.0–36.0)
MCV: 95.2 fL (ref 80.0–100.0)
Monocytes Absolute: 2.1 10*3/uL — ABNORMAL HIGH (ref 0.1–1.0)
Monocytes Relative: 7 %
Neutro Abs: 26 10*3/uL — ABNORMAL HIGH (ref 1.7–7.7)
Neutrophils Relative %: 86 %
Platelets: 246 10*3/uL (ref 150–400)
RBC: 2.3 MIL/uL — ABNORMAL LOW (ref 4.22–5.81)
RDW: 16 % — ABNORMAL HIGH (ref 11.5–15.5)
WBC: 30.2 10*3/uL — ABNORMAL HIGH (ref 4.0–10.5)
nRBC: 0 % (ref 0.0–0.2)

## 2023-09-18 LAB — BASIC METABOLIC PANEL
Anion gap: 13 (ref 5–15)
BUN: 12 mg/dL (ref 6–20)
CO2: 23 mmol/L (ref 22–32)
Calcium: 7.4 mg/dL — ABNORMAL LOW (ref 8.9–10.3)
Chloride: 94 mmol/L — ABNORMAL LOW (ref 98–111)
Creatinine, Ser: 1.85 mg/dL — ABNORMAL HIGH (ref 0.61–1.24)
GFR, Estimated: 44 mL/min — ABNORMAL LOW (ref >60)
Glucose, Bld: 86 mg/dL (ref 70–99)
Potassium: 3.1 mmol/L — ABNORMAL LOW (ref 3.5–5.1)
Sodium: 130 mmol/L — ABNORMAL LOW (ref 135–145)

## 2023-09-18 LAB — APTT: aPTT: 29 s (ref 24–36)

## 2023-09-18 LAB — GLUCOSE, CAPILLARY
Glucose-Capillary: 80 mg/dL (ref 70–99)
Glucose-Capillary: 89 mg/dL (ref 70–99)
Glucose-Capillary: 103 mg/dL — ABNORMAL HIGH (ref 70–99)
Glucose-Capillary: 141 mg/dL — ABNORMAL HIGH (ref 70–99)

## 2023-09-18 LAB — CBG MONITORING, ED: Glucose-Capillary: 91 mg/dL (ref 70–99)

## 2023-09-18 LAB — HEMOGLOBIN A1C
Hgb A1c MFr Bld: 5.1 % (ref 4.8–5.6)
Mean Plasma Glucose: 99.67 mg/dL

## 2023-09-18 LAB — TROPONIN I (HIGH SENSITIVITY): Troponin I (High Sensitivity): 42 ng/L — ABNORMAL HIGH (ref <18)

## 2023-09-18 LAB — HIV ANTIBODY (ROUTINE TESTING W REFLEX): HIV Screen 4th Generation wRfx: NONREACTIVE

## 2023-09-18 LAB — SEDIMENTATION RATE: Sed Rate: 54 mm/h — ABNORMAL HIGH (ref 0–16)

## 2023-09-18 LAB — PROTIME-INR
INR: 1.1 (ref 0.8–1.2)
Prothrombin Time: 14.8 s (ref 11.4–15.2)

## 2023-09-18 LAB — HEPATITIS B SURFACE ANTIGEN: Hepatitis B Surface Ag: NONREACTIVE

## 2023-09-18 MED ORDER — LOSARTAN POTASSIUM 50 MG PO TABS: 100.0000 mg | ORAL_TABLET | Freq: Every day | ORAL | Status: DC

## 2023-09-18 MED ORDER — ALTEPLASE 2 MG IJ SOLR: 2.0000 mg | Freq: Once | INTRAMUSCULAR | Status: DC | PRN

## 2023-09-18 MED ORDER — LOSARTAN POTASSIUM 50 MG PO TABS
50.0000 mg | ORAL_TABLET | Freq: Every day | ORAL | Status: DC
  Administered 2023-09-18 – 2023-10-04 (×15): 50 mg via ORAL
  Filled 2023-09-18 (×16): qty 1

## 2023-09-18 MED ORDER — PENTAFLUOROPROP-TETRAFLUOROETH EX AERO: 1.0000 | INHALATION_SPRAY | CUTANEOUS | Status: DC | PRN

## 2023-09-18 MED ORDER — CLONIDINE HCL 0.1 MG PO TABS
0.1000 mg | ORAL_TABLET | Freq: Three times a day (TID) | ORAL | Status: DC
  Administered 2023-09-18 – 2023-10-01 (×36): 0.1 mg via ORAL
  Filled 2023-09-18 (×39): qty 1

## 2023-09-18 MED ORDER — CLONIDINE HCL 0.2 MG PO TABS: 0.3000 mg | ORAL_TABLET | Freq: Three times a day (TID) | ORAL | Status: DC

## 2023-09-18 MED ORDER — ATORVASTATIN CALCIUM 40 MG PO TABS
40.0000 mg | ORAL_TABLET | Freq: Every day | ORAL | Status: DC
  Administered 2023-09-18 – 2023-10-03 (×17): 40 mg via ORAL
  Filled 2023-09-18 (×17): qty 1

## 2023-09-18 MED ORDER — BOOST / RESOURCE BREEZE PO LIQD CUSTOM
1.0000 | Freq: Three times a day (TID) | ORAL | Status: DC
  Administered 2023-09-18 – 2023-09-23 (×9): 1 via ORAL

## 2023-09-18 MED ORDER — AMLODIPINE BESYLATE 10 MG PO TABS
10.0000 mg | ORAL_TABLET | Freq: Every day | ORAL | Status: DC
  Administered 2023-09-18 – 2023-10-03 (×14): 10 mg via ORAL
  Filled 2023-09-18 (×16): qty 1

## 2023-09-18 MED ORDER — INSULIN GLARGINE-YFGN 100 UNIT/ML ~~LOC~~ SOLN
5.0000 [IU] | Freq: Two times a day (BID) | SUBCUTANEOUS | Status: DC
  Administered 2023-09-18 – 2023-09-19 (×4): 5 [IU] via SUBCUTANEOUS
  Filled 2023-09-18 (×6): qty 0.05

## 2023-09-18 MED ORDER — HYDRALAZINE HCL 25 MG PO TABS
25.0000 mg | ORAL_TABLET | Freq: Three times a day (TID) | ORAL | Status: DC
  Administered 2023-09-18 – 2023-10-04 (×44): 25 mg via ORAL
  Filled 2023-09-18 (×45): qty 1

## 2023-09-18 MED ORDER — PAROXETINE HCL 10 MG PO TABS
10.0000 mg | ORAL_TABLET | Freq: Every day | ORAL | Status: DC
  Administered 2023-09-18 – 2023-10-04 (×16): 10 mg via ORAL
  Filled 2023-09-18 (×17): qty 1

## 2023-09-18 MED ORDER — HEPARIN SODIUM (PORCINE) 1000 UNIT/ML DIALYSIS
1000.0000 [IU] | INTRAMUSCULAR | Status: DC | PRN
  Filled 2023-09-18: qty 1

## 2023-09-18 MED ORDER — DARBEPOETIN ALFA 60 MCG/0.3ML IJ SOSY
60.0000 ug | PREFILLED_SYRINGE | INTRAMUSCULAR | Status: DC
  Administered 2023-09-18 – 2023-09-25 (×2): 60 ug via SUBCUTANEOUS
  Filled 2023-09-18 (×2): qty 0.3

## 2023-09-18 MED ORDER — RENA-VITE PO TABS
1.0000 | ORAL_TABLET | Freq: Every day | ORAL | Status: DC
  Administered 2023-09-19 – 2023-10-03 (×16): 1 via ORAL
  Filled 2023-09-18 (×16): qty 1

## 2023-09-18 MED ORDER — AMIODARONE HCL 200 MG PO TABS
100.0000 mg | ORAL_TABLET | Freq: Every day | ORAL | Status: DC
  Administered 2023-09-18 – 2023-10-04 (×16): 100 mg via ORAL
  Filled 2023-09-18 (×16): qty 1

## 2023-09-18 MED ORDER — FOOD THICKENER (SIMPLYTHICK): 1.0000 | ORAL | Status: DC | PRN

## 2023-09-18 MED ORDER — LIDOCAINE HCL (PF) 1 % IJ SOLN: 5.0000 mL | INTRAMUSCULAR | Status: DC | PRN

## 2023-09-18 MED ORDER — ONDANSETRON HCL 4 MG/2ML IJ SOLN
4.0000 mg | Freq: Two times a day (BID) | INTRAMUSCULAR | Status: DC
  Administered 2023-09-18 – 2023-09-24 (×11): 4 mg via INTRAVENOUS
  Filled 2023-09-18 (×13): qty 2

## 2023-09-18 MED ORDER — INSULIN ASPART 100 UNIT/ML IJ SOLN
0.0000 [IU] | Freq: Every day | INTRAMUSCULAR | Status: DC
  Administered 2023-09-19: 2 [IU] via SUBCUTANEOUS
  Administered 2023-09-22: 4 [IU] via SUBCUTANEOUS
  Administered 2023-09-23: 3 [IU] via SUBCUTANEOUS
  Administered 2023-09-25 – 2023-09-30 (×2): 2 [IU] via SUBCUTANEOUS
  Administered 2023-10-03: 3 [IU] via SUBCUTANEOUS

## 2023-09-18 MED ORDER — ALBUTEROL SULFATE (2.5 MG/3ML) 0.083% IN NEBU: 2.5000 mg | INHALATION_SOLUTION | RESPIRATORY_TRACT | Status: DC | PRN

## 2023-09-18 MED ORDER — LIDOCAINE-PRILOCAINE 2.5-2.5 % EX CREA: 1.0000 | TOPICAL_CREAM | CUTANEOUS | Status: DC | PRN

## 2023-09-18 MED ORDER — ANTICOAGULANT SODIUM CITRATE 4% (200MG/5ML) IV SOLN: 5.0000 mL | Status: DC | PRN

## 2023-09-18 MED ORDER — CARVEDILOL 25 MG PO TABS
25.0000 mg | ORAL_TABLET | Freq: Two times a day (BID) | ORAL | Status: DC
  Administered 2023-09-18 – 2023-10-04 (×28): 25 mg via ORAL
  Filled 2023-09-18 (×30): qty 1

## 2023-09-18 MED ORDER — CARVEDILOL 12.5 MG PO TABS: 37.5000 mg | ORAL_TABLET | Freq: Two times a day (BID) | ORAL | Status: DC

## 2023-09-18 MED ORDER — INFLUENZA VIRUS VACC SPLIT PF (FLUZONE) 0.5 ML IM SUSY
0.5000 mL | PREFILLED_SYRINGE | INTRAMUSCULAR | Status: DC
  Filled 2023-09-18: qty 0.5

## 2023-09-18 MED ORDER — CHLORHEXIDINE GLUCONATE CLOTH 2 % EX PADS
6.0000 | MEDICATED_PAD | Freq: Every day | CUTANEOUS | Status: DC
  Administered 2023-09-19 – 2023-09-20 (×2): 6 via TOPICAL

## 2023-09-18 MED ORDER — ONDANSETRON HCL 4 MG PO TABS
4.0000 mg | ORAL_TABLET | Freq: Three times a day (TID) | ORAL | Status: DC | PRN
  Administered 2023-09-18: 4 mg via ORAL
  Filled 2023-09-18: qty 1

## 2023-09-18 MED ORDER — TERAZOSIN HCL 1 MG PO CAPS
1.0000 mg | ORAL_CAPSULE | Freq: Every day | ORAL | Status: DC
  Administered 2023-09-18 – 2023-10-03 (×17): 1 mg via ORAL
  Filled 2023-09-18 (×18): qty 1

## 2023-09-18 MED ORDER — INSULIN ASPART 100 UNIT/ML IJ SOLN
0.0000 [IU] | Freq: Three times a day (TID) | INTRAMUSCULAR | Status: DC
  Administered 2023-09-20: 1 [IU] via SUBCUTANEOUS
  Administered 2023-09-20 – 2023-09-21 (×2): 2 [IU] via SUBCUTANEOUS
  Administered 2023-09-22: 3 [IU] via SUBCUTANEOUS
  Administered 2023-09-22: 1 [IU] via SUBCUTANEOUS
  Administered 2023-09-22: 4 [IU] via SUBCUTANEOUS
  Administered 2023-09-23: 5 [IU] via SUBCUTANEOUS
  Administered 2023-09-23: 2 [IU] via SUBCUTANEOUS
  Administered 2023-09-24 – 2023-10-03 (×5): 1 [IU] via SUBCUTANEOUS

## 2023-09-18 MED ORDER — LEVETIRACETAM 500 MG PO TABS
1500.0000 mg | ORAL_TABLET | Freq: Two times a day (BID) | ORAL | Status: DC
  Administered 2023-09-18 – 2023-10-04 (×33): 1500 mg via ORAL
  Filled 2023-09-18 (×33): qty 3

## 2023-09-18 MED ORDER — PROCHLORPERAZINE EDISYLATE 10 MG/2ML IJ SOLN
10.0000 mg | Freq: Four times a day (QID) | INTRAMUSCULAR | Status: DC | PRN
  Administered 2023-09-24: 10 mg via INTRAVENOUS
  Filled 2023-09-18: qty 2

## 2023-09-18 MED ORDER — FINASTERIDE 5 MG PO TABS
5.0000 mg | ORAL_TABLET | Freq: Every day | ORAL | Status: DC
  Administered 2023-09-18 – 2023-10-04 (×16): 5 mg via ORAL
  Filled 2023-09-18 (×16): qty 1

## 2023-09-18 MED ORDER — PANTOPRAZOLE SODIUM 40 MG PO TBEC
40.0000 mg | DELAYED_RELEASE_TABLET | Freq: Two times a day (BID) | ORAL | Status: DC
  Administered 2023-09-18 – 2023-10-04 (×33): 40 mg via ORAL
  Filled 2023-09-18 (×33): qty 1

## 2023-09-18 NOTE — ED Notes (Signed)
Return page received from [facility] MD.  This RN notified MD of wife's report of slight facial droop.  CT head results still pending from prior.

## 2023-09-18 NOTE — Assessment & Plan Note (Signed)
Patient is on several high doses of her antihypertensive regimen.  Including clonidine 0.3 mg 3 times daily, Coreg 37.5 twice daily, amlodipine 10 mg daily as well as hydralazine 25 3 times daily I will reduce some of these doses here in the hospital and retitrate up if needed.  Because patient's blood pressure is not very high right now.

## 2023-09-18 NOTE — Consult Note (Signed)
[name] Renal Consultation Note    Indication for Consultation:  Management of ESRD/hemodialysis; anemia, hypertension/volume and secondary hyperparathyroidism  [id number], [place], MD  HPI: Bruce Little is a 52 y.o. male with ESRD 2/2 DM/HTN on HD TTS at [facility].  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  He recently started outpatient dialysis at [facility] on 09/09/23.   Patient seen and examined at bedside with no family present.  Majority of history obtained from chart due to being poor historian.  Oriented to person and place [facility]) but not time.  Presented to the ED due to nausea, vomiting, PEG tube issues and concern for UTI based on urine sediment in foley bag.  Patient has had daily vomiting since August, sometimes 2-3 times a day.  He has lost about 30lbs since hospitalization but recently has gained weight.  He denies CP, SOB, abdominal pain and n/v/d.   Per outpatient chart review patient he has been getting under his dry weight this past week, post weight on 11/7 96.3kg.   Pertinent findings in work up include K 3.1, SCr 1.85, BUN 12, WBC 30.2, Hgb 7.1, CT abd/pelvis with large sacral decubitus ulcer with resorption of the inferior sacrum and coccyx. No abscess.  Small left-greater-than-right pleural effusions and associated atelectasis.  Patient being admitted for further evaluation and management.   Past Medical History:  Diagnosis Date   A-fib (HCC)    Anemia    low iron   Chronic kidney disease    COVID    has had it 2 times, one mild and one wasn't   DM2 (diabetes mellitus, type 2) (HCC)    History of blood transfusion    HTN (hypertension)    Osteomyelitis of fifth toe of left foot (HCC) 08/13/2021    Osteomyelitis of fourth toe of left foot (HCC) 08/13/2021   Pneumonia    Stroke (HCC) 05/19/2021   no residual effects.   Stroke [facility])    Past Surgical History:  Procedure Laterality Date   AMPUTATION Left 08/29/2021   Procedure: AMPUTATION OF FOURTH TOE AND RAY ALONG WITH REMAINING FITH METATARSAL;  Surgeon: Tarry Kos, MD;  Location: [facility] OR;  Service: Orthopedics;  Laterality: Left;   AMPUTATION Left 09/03/2021   Procedure: LISFRANC AMPUTATION;  Surgeon: Tarry Kos, MD;  Location: [facility] OR;  Service: Orthopedics;  Laterality: Left;   AMPUTATION Left 10/29/2021   Procedure: AMPUTATION BELOW KNEE -LEFT;  Surgeon: Tarry Kos, MD;  Location: [facility] OR;  Service: Orthopedics;  Laterality: Left;   APPLICATION OF WOUND VAC Left 07/07/2021   Procedure: APPLICATION OF WOUND VAC;  Surgeon: Tarry Kos, MD;  Location: [facility] OR;  Service: Orthopedics;  Laterality: Left;   APPLICATION OF WOUND VAC Left 08/29/2021   Procedure: APPLICATION OF WOUND VAC;  Surgeon: Tarry Kos, MD;  Location: [facility] OR;  Service: Orthopedics;  Laterality: Left;   I & D EXTREMITY Left 07/03/2021   Procedure: IRRIGATION AND DEBRIDEMENT ,FIFTH RAY  AMPUTATION LEFT FOOT, , WOUND VAC PLACEMENT;  Surgeon: Tarry Kos, MD;  Location: [facility] OR;  Service: Orthopedics;  Laterality: Left;   I & D EXTREMITY Left 07/07/2021   Procedure: IRRIGATION AND DEBRIDEMENT LEFT FOOT;  Surgeon: Tarry Kos, MD;  Location: [facility] OR;  Service: Orthopedics;  Laterality: Left;   I & D EXTREMITY Left 08/29/2021   Procedure: IRRIGATION AND DEBRIDEMENT LEFT FOOT;  Surgeon: Tarry Kos, MD;  Location: [facility] OR;  Service: Orthopedics;  Laterality: Left;   IR FLUORO GUIDE CV LINE RIGHT  07/09/2021   IR REMOVAL TUN CV CATH W/O FL  10/08/2021   IR REMOVAL TUN CV CATH W/O FL  07/13/2023   IR US GUIDE VASC ACCESS RIGHT  07/09/2021   VITRECTOMY Left    [facility]   Family History  Problem Relation Age of Onset   Stroke Mother    Cancer Mother    Heart  disease Father    Social History:  reports that he has never smoked. He has never used smokeless tobacco. He reports that he does not currently use alcohol. He reports that he does not use drugs. No Known Allergies Prior to Admission medications   Medication Sig Start Date End Date Taking? Authorizing Provider  albuterol (VENTOLIN HFA) 108 (90 Base) MCG/ACT inhaler Inhale 2 puffs into the lungs every 4 (four) hours as needed. 09/15/23  Yes [provider]  amiodarone (PACERONE) 100 MG tablet Take 100 mg by mouth daily.   Yes [provider]  amLODipine (NORVASC) 10 MG tablet Take 10 mg by mouth daily.   Yes [provider]  atorvastatin (LIPITOR) 40 MG tablet Take 40 mg by mouth at bedtime.   Yes [provider]  carvedilol (COREG) 12.5 MG tablet Take 37.5 mg by mouth 2 (two) times daily with a meal.   Yes [provider]  cloNIDine (CATAPRES) 0.1 MG tablet Take 0.3 mg by mouth 3 (three) times daily.   Yes [provider]  finasteride (PROSCAR) 5 MG tablet Take 5 mg by mouth daily.   Yes [provider]  furosemide (LASIX) 40 MG tablet Take 40 mg by mouth daily.   Yes [provider]  hydrALAZINE (APRESOLINE) 25 MG tablet Take 25 mg by mouth every 8 (eight) hours.   Yes [provider]  LANTUS SOLOSTAR 100 UNIT/ML Solostar Pen Inject 5 Units into the skin every 12 (twelve) hours. 08/31/23  Yes [provider]  levETIRAcetam (KEPPRA) 750 MG tablet Take 1,500 mg by mouth 2 (two) times daily.   Yes [provider]  losartan (COZAAR) 100 MG tablet Take 100 mg by mouth daily.   Yes [provider]  Melatonin 10 MG TABS Take 1 tablet by mouth at bedtime.   Yes [provider]  ondansetron (ZOFRAN) 4 MG tablet Take 1 tablet (4 mg total) by mouth every 8 (eight) hours as needed for nausea or vomiting. 10/08/21  Yes Cristie Hem, PA-C  pantoprazole (PROTONIX) 40 MG tablet Take 1 tablet  (40 mg total) by mouth daily. Patient taking differently: Take 40 mg by mouth 2 (two) times daily. 05/22/21  Yes Sheikh, Omair Latif, DO  PARoxetine (PAXIL) 20 MG tablet Take 10 mg by mouth daily.   Yes [provider]  terazosin (HYTRIN) 1 MG capsule Take 1 mg by mouth at bedtime.   Yes [provider]  atorvastatin (LIPITOR) 80 MG tablet Take 1 tablet (80 mg total) by mouth every evening. Patient not taking: Reported on 09/17/2023 07/31/21   Patwardhan, Anabel Bene, MD  glipiZIDE (GLUCOTROL) 5 MG tablet Take 1 tablet (5 mg total) by mouth daily. 05/21/21 05/21/22  Merlene Laughter, DO   Current Facility-Administered Medications  Medication Dose Route Frequency Provider Last Rate Last Admin   acetaminophen (  TYLENOL) tablet 650 mg  650 mg Oral Q6H PRN Nolberto Hanlon, MD   650 mg at 09/18/23 0211   Or   acetaminophen (TYLENOL) suppository 650 mg  650 mg Rectal Q6H PRN Nolberto Hanlon, MD       albuterol (PROVENTIL) (2.5 MG/3ML) 0.083% nebulizer solution 2.5 mg  2.5 mg Inhalation Q4H PRN Nolberto Hanlon, MD       amiodarone (PACERONE) tablet 100 mg  100 mg Oral Daily Nolberto Hanlon, MD   100 mg at 09/18/23 0917   amLODipine (NORVASC) tablet 10 mg  10 mg Oral Daily Nolberto Hanlon, MD   10 mg at 09/18/23 0917   atorvastatin (LIPITOR) tablet 40 mg  40 mg Oral Sherene Sires, MD   40 mg at 09/18/23 0128   carvedilol (COREG) tablet 25 mg  25 mg Oral BID WC Nolberto Hanlon, MD   25 mg at 09/18/23 0917   cefTRIAXone (ROCEPHIN) 2 g in sodium chloride 0.9 % 100 mL IVPB  2 g Intravenous Q24H Nolberto Hanlon, MD       cloNIDine (CATAPRES) tablet 0.1 mg  0.1 mg Oral TID Nolberto Hanlon, MD   0.1 mg at 09/18/23 0917   dextrose 5% in lactated ringers with KCl 20 mEq/L infusion   Intravenous Continuous Nolberto Hanlon, MD 75 mL/hr at 09/18/23 0157 New Bag at 09/18/23 0157   feeding supplement (BOOST / RESOURCE BREEZE) liquid 1 Container  1 Container Oral TID BM Nolberto Hanlon, MD       finasteride (PROSCAR) tablet 5 mg  5 mg Oral  Daily Nolberto Hanlon, MD   5 mg at 09/18/23 0917   hydrALAZINE (APRESOLINE) tablet 25 mg  25 mg Oral Q8H Nolberto Hanlon, MD   25 mg at 09/18/23 4782   influenza vac split trivalent PF (FLULAVAL) injection 0.5 mL  0.5 mL Intramuscular Tomorrow-1000 Nolberto Hanlon, MD       insulin aspart (novoLOG) injection 0-5 Units  0-5 Units Subcutaneous QHS Nolberto Hanlon, MD       insulin aspart (novoLOG) injection 0-6 Units  0-6 Units Subcutaneous TID WC Nolberto Hanlon, MD       insulin glargine-yfgn (SEMGLEE) injection 5 Units  5 Units Subcutaneous BID Nolberto Hanlon, MD   5 Units at 09/18/23 0918   levETIRAcetam (KEPPRA) tablet 1,500 mg  1,500 mg Oral BID Nolberto Hanlon, MD   1,500 mg at 09/18/23 0917   losartan (COZAAR) tablet 50 mg  50 mg Oral Daily Nolberto Hanlon, MD   50 mg at 09/18/23 0917   metoCLOPramide (REGLAN) tablet 5 mg  5 mg Oral TID Thalia Party, MD   5 mg at 09/18/23 9562   multivitamin (RENA-VIT) tablet 1 tablet  1 tablet Oral QHS Leatha Gilding, MD       ondansetron (ZOFRAN) injection 4 mg  4 mg Intravenous Q12H Leatha Gilding, MD       pantoprazole (PROTONIX) EC tablet 40 mg  40 mg Oral BID Nolberto Hanlon, MD   40 mg at 09/18/23 0917   PARoxetine (PAXIL) tablet 10 mg  10 mg Oral Daily Nolberto Hanlon, MD   10 mg at 09/18/23 0918   polyethylene glycol (MIRALAX / GLYCOLAX) packet 17 g  17 g Oral Daily PRN Nolberto Hanlon, MD       prochlorperazine (COMPAZINE) injection 10 mg  10 mg Intravenous Q6H PRN Leatha Gilding, MD       sodium chloride flush (NS) 0.9 % injection 3 mL  3 mL Intravenous  Q12H Nolberto Hanlon, MD   3 mL at 09/18/23 0918   terazosin (HYTRIN) capsule 1 mg  1 mg Oral QHS Nolberto Hanlon, MD   1 mg at 09/18/23 9528   Labs: Basic Metabolic Panel: Recent Labs  Lab 09/17/23 1006 09/18/23 0058  NA 132* 130*  K 3.1* 3.1*  CL 96* 94*  CO2 27 23  GLUCOSE 142* 86  BUN 9 12  CREATININE 1.69* 1.85*  CALCIUM 7.8* 7.4*   Liver Function Tests: Recent Labs  Lab 09/17/23 1006  AST 27  ALT 30  ALKPHOS  106  BILITOT 0.8  PROT 5.4*  ALBUMIN 1.6*   Recent Labs  Lab 09/17/23 1006  LIPASE 43   CBC: Recent Labs  Lab 09/17/23 1006 09/18/23 0058  WBC 20.4* 30.2*  NEUTROABS  --  26.0*  HGB 7.9* 7.1*  HCT 24.3* 21.9*  MCV 94.6 95.2  PLT 276 246    CBG: Recent Labs  Lab 09/18/23 0051 09/18/23 0311 09/18/23 0715 09/18/23 1109  GLUCAP 91 80 89 103*    Studies/Results: CT HEAD WO CONTRAST ( )  Result Date: 09/18/2023 CLINICAL DATA:  Stroke in June. Left-sided paralysis. Sent to ED by primary care. EXAM: CT HEAD WITHOUT CONTRAST TECHNIQUE: Contiguous axial images were obtained from the base of the skull through the vertex without intravenous contrast. RADIATION DOSE REDUCTION: This exam was performed according to the departmental dose-optimization program which includes automated exposure control, adjustment of the mA and/or kV according to patient size and/or use of iterative reconstruction technique. COMPARISON:  MRI head 05/19/2021. FINDINGS: Brain: No intracranial hemorrhage, mass effect, or evidence of acute infarct. Extensive encephalomalacia in the right cerebral hemisphere compatible with chronic infarct. Ex vacuo dilatation ventricles. Basal cisterns are patent. No extra-axial fluid collection. Age-commensurate cerebral atrophy and chronic small vessel ischemic disease. Vascular: No hyperdense vessel. Intracranial arterial calcification. Skull: No fracture or focal lesion.  Right craniectomy. Sinuses/Orbits: Opacification of the few mastoid air cells bilaterally. Mucosal thickening left maxillary sinus. Other: None. IMPRESSION: 1. No acute intracranial abnormality. 2. Extensive chronic infarct in the right cerebral hemisphere. Electronically Signed   By: Minerva Fester M.D.   On: 09/18/2023 03:19   CT ABDOMEN PELVIS W CONTRAST  Result Date: 09/17/2023 CLINICAL DATA:  Nausea, vomiting, possible UTI.  GI tube clogged EXAM: CT ABDOMEN AND PELVIS WITH CONTRAST TECHNIQUE:  Multidetector CT imaging of the abdomen and pelvis was performed using the standard protocol following bolus administration of intravenous contrast. RADIATION DOSE REDUCTION: This exam was performed according to the departmental dose-optimization program which includes automated exposure control, adjustment of the mA and/or kV according to patient size and/or use of iterative reconstruction technique. CONTRAST:  75mL OMNIPAQUE IOHEXOL 350 MG/ML SOLN COMPARISON:  Renal ultrasound 08/04/2023 and radiographs 08/02/2023 FINDINGS: Lower chest: Small left-greater-than-right pleural effusions and associated atelectasis. Hepatobiliary: Mild distention of the gallbladder. Hyperdense layering sludge in the gallbladder. No biliary dilation. Liver is unremarkable. Pancreas: Unremarkable. Spleen: Unremarkable. Adrenals/Urinary Tract: Unremarkable adrenal glands. No urinary calculi or hydronephrosis. Foley catheter in the nondistended bladder. Stomach/Bowel: Normal caliber large and small bowel. Moderate colonic stool load. Normal appendix. Stomach is within normal limits. Expected location of the percutaneous gastrostomy tube in the stomach. Vascular/Lymphatic: Aortic atherosclerosis. No enlarged abdominal or pelvic lymph nodes. Reproductive: No acute abnormality. Other: Smaller free fluid in the pelvis. No free intraperitoneal air. Musculoskeletal: Resorption of the inferior sacrum and coccyx compatible with osteomyelitis. There is a large sacral decubitus ulcer measuring 7.4 x 6.6 x 3.9 cm  and abutting the inferior portion of the remaining sacrum. Adjacent fat stranding and subcutaneous and presacral edema. No abscess. No soft tissue gas. IMPRESSION: 1. Large sacral decubitus ulcer with resorption of the inferior sacrum and coccyx. No abscess. 2. Small left-greater-than-right pleural effusions and associated atelectasis. 3. Expected location of the percutaneous gastrostomy tube in the stomach. 4. Foley catheter in the  nondistended bladder. Aortic Atherosclerosis (ICD10-I70.0). Electronically Signed   By: Minerva Fester M.D.   On: 09/17/2023 20:35    ROS: All others negative except those listed in HPI.  Physical Exam: Vitals:   09/18/23 0214 09/18/23 0252 09/18/23 0307 09/18/23 0824  BP:  131/79  (!) 114/59  Pulse:  73  68  Resp:  18  18  Temp: 98.7 F (37.1 C) 98.2 F (36.8 C)  98.4 F (36.9 C)  TempSrc: Oral     SpO2:  97%  100%  Weight:   93.6 kg   Height:   6\' 2"  (1.88 m)      General: alert male in NAD Head: NCAT sclera not icteric MMM Neck: Supple. Lungs: CTA bilaterally. No wheeze, rales or rhonchi. Breathing is unlabored. Heart: RRR. No murmur, rubs or gallops.  Abdomen: soft, nontender, +BS, PEG tube in place  Lower extremities:no edema, L BKA Neuro: AAOx2. L side hemiparesis Psych:  Responds to questions appropriately with a normal affect. Dialysis Access: [facility]  Dialysis Orders:  TTS - [facility]  4hrs, BFR 500, DFR AF 1.5,  EDW 98.6kg, 3K/ 2.5Ca  Access: TDC  Heparin none Mircera 50 mcg q2wks - not yet given Hectorol IV qHD    Assessment/Plan:  Vomiting - Daily for months, G tube malfunction. Per PMD  ESRD -  On HD TTS. Started during prolonged hospitalization but previously followed for CKD stage 5 planning for HD.  Suspect muscle wasting causing falsely low SCr.  Can check 24hr urine to confirm.  HD today per regular schedule.  Leukocytosis - WBC trending up.  UTI suspected, ABX started.  Blood cultures obtained.  Has sacral decub and TDC as possible infection sources.  Peg tube malfunction - IR  consulted. Per PMD  Hypertension/volume  - BP in goal. On multiple meds, PMD titrating. Does not appear grossly overloaded.  Pleural effusions noted on CT. Getting under dry weight, likely needs to be lowered.  UF as tolerated.   Anemia of CKD - Hgb 7.1. Will give ESA today.   Secondary Hyperparathyroidism -  CCa ok. Check phos. Continue home meds.   Nutrition - Regular diet,  follow labs. Add protein supplements.   Virgina Norfolk, PA-C [facility] 09/18/2023, 11:48 AM

## 2023-09-18 NOTE — Progress Notes (Addendum)
Initial Nutrition Assessment  DOCUMENTATION CODES:   Not applicable, suspect malnutrition but unable to confirm at this time  INTERVENTION:   - Continue Boost Breeze po TID while on clear liquid diet, each supplement provides 250 kcal and 9 grams of protein, thickened to nectar-thick consistency  - Renal MVI daily  Recommend initiation of continuous tube feeds via PEG once it is evaluated by IR and unclogged. Recommend: - Start Osmolite 1.5 @ 25 ml/hr and advance rate by 10 ml every 6 hours to goal rate of 65 ml/hr (1560 ml/day) - PROSource TF20 60 ml BID  Recommended continuous tube feeding regimen at goal rate would provide 2500 kcal, 138 grams of protein, and 1189 ml of H2O.    Once pt demonstrates tolerance of continuous tube feeds at goal rate, consider transitioning to bolus tube feeding regimen: - 1.5 cartons (355 ml) of Osmolite 1.5 formula QID (6 total cartons daily)  This recommended bolus tube feeding regimen would provide 2130 kcal, 89 grams of protein, and 1086 ml of H2O; this meets 88% of minimum kcal needs and 74% of minimum protein needs.  If pt having issues with hyperkalemia and hyperphosphatemia or volume on Osmolite 1.5 cal formula, recommend trial of Nepro with Carb Steady: - 1 carton (237 ml) of Nepro with Carb Steady formula 5 times daily (5 cartons daily)  This recommended bolus tube feeding regimen would provide 2100 kcal, 95 grams of protein, and 860 ml of H2O; this meets 87% of minimum kcal needs and 79% of minimum protein needs.   - Once PEG tube able to be used, recommend ordering 1 packet Juven BID per tube to support wound healing, each packet provides 95 calories, 2.5 grams of protein, and 9.8 grams of carbohydrate  - Checking vitamin A, vitamin C, vitamin D, zinc, and CRP labs to evaluate for deficiencies given non-healing pressure injuries  NUTRITION DIAGNOSIS:   Inadequate oral intake related to poor appetite, vomiting as evidenced by percent  weight loss (15.8% weight loss in 5 months, chronic non-healing pressure injuries).  GOAL:   Patient will meet greater than or equal to 90% of their needs  MONITOR:   PO intake, Supplement acceptance, Diet advancement, Labs, Weight trends, Skin, I & O's  REASON FOR ASSESSMENT:   Consult Assessment of nutrition requirement/status, Diet education, Calorie Count, Enteral/tube feeding initiation and management  ASSESSMENT:   52 year old male who presented to the ED on 11/08 with nausea, UTI, clogged G-tube. 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  11/08 - clear liquid diet  RD working remotely. Unable to reach pt via phone call to room. Voicemail left with pt's wife at phone number provided in chart.  Per chart review, pt has been vomiting 3-4 times daily for the last 2 month. Vomiting has been removed as random and sometimes after meds/feeds. Pt has had decreased PO intake recently; unfortunately, PEG tube has been clogged and therefore not used for 6 weeks (originally placed on 05/14/23 in [place]). Notes document a 30 lb weight loss since stroke. MD has consulted IR to assist with clogged PEG tube, but pt is expressing desire to have PEG tube removed altogether.  Unable to obtain diet history specifics at this time. Reviewed weight history in chart. Pt has experienced a documented 17.5 kg weight loss since 04/14/23. This  is a 15.8% weight loss in 5 months which is severe and significant for timeframe. 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 Suspect pt with malnutrition but RD unable to confirm without NFPE or detailed diet history.  Pt is currently on a clear liquid diet with Boost Breeze ordered TID. Given non-healing pressure injuries  and significant weight loss, pt is not meeting his nutritional needs via PO intake alone. RD recommends resuming enteral nutrition after PEG tube is evaluated and unclogged. Recommend starting with continuous tube feeds to establish tolerance given issues with vomiting. If pt tolerates continuous tube feeds at goal rate, can switch to bolus regimen. Recommendations for both can be found above. RD reached out to MD via secure chat to discuss recommendations.  Will also check vitamin A, vitamin C, vitamin D, zinc, and CRP labs to evaluate for deficiencies given non-healing pressure injuries. Recommend supplementation if deficiencies are identified. Will order daily renal MVI as well.  Medications reviewed and include: Boost Breeze TID, SSI, semglee 5 units BID, reglan 5 mg TID before meals, protonix, IV abx IVF: D5 in LR with KCl 20 mEq/L @ 75 ml/hr  Micronutrient Profile: Vitamin A: pending Vitamin D: pending Vitamin C: pending Zinc: pending CRP: pending  Labs reviewed: sodium 130, potassium 3.1, chloride 94, WBC 30.2, hemoglobin 7.1 CBG's: 80-91 x 24 hours  NUTRITION - FOCUSED PHYSICAL EXAM:  Unable to complete at this time. RD working remotely.  Diet Order:   Diet Order             Diet clear liquid Room service appropriate? Yes; Fluid consistency: Nectar Thick  Diet effective now                   EDUCATION NEEDS:   Not appropriate for education at this time  Skin:  Skin Assessment: Skin Integrity Issues: Stage IV: sacrum Unstageable: R thigh Diabetic Ulcer: R foot, R heel  Last BM:  09/16/23  Height:   Ht Readings from Last 1 Encounters:  09/18/23 6\' 2"  (1.88 m)    Weight:   Wt Readings from Last 1 Encounters:  09/18/23 93.6 kg    Ideal Body Weight:  86.4 kg  BMI:  Body mass index is 26.49 kg/m.  Estimated Nutritional Needs:   Kcal:  2400-2600  Protein:  120-140 grams  Fluid:  >2.2 L    Mertie Clause, MS, RD, LDN Registered Dietitian  II Please see AMiON for contact information.

## 2023-09-18 NOTE — Plan of Care (Signed)

## 2023-09-18 NOTE — Evaluation (Signed)
Clinical/Bedside Swallow Evaluation Patient Details  Name: Bruce Little MRN: [id number] Date of Birth: 1971-05-19  Today's Date: 09/18/2023 Time: SLP Start Time (ACUTE ONLY): 1309 SLP Stop Time (ACUTE ONLY): 1330 SLP Time Calculation (min) (ACUTE ONLY): 21 min  Past Medical History:  Past Medical History:  Diagnosis Date   A-fib (HCC)    Anemia    low iron   Chronic kidney disease    COVID    has had it 2 times, one mild and one wasn't   DM2 (diabetes mellitus, type 2) (HCC)    History of blood transfusion    HTN (hypertension)    Osteomyelitis of fifth toe of left foot (HCC) 08/13/2021   Osteomyelitis of fourth toe of left foot (HCC) 08/13/2021   Pneumonia    Stroke (HCC) 05/19/2021   no residual effects.   Stroke [facility])    Past Surgical History:  Past Surgical History:  Procedure Laterality Date   AMPUTATION Left 08/29/2021   Procedure: AMPUTATION OF FOURTH TOE AND RAY ALONG WITH REMAINING FITH METATARSAL;  Surgeon: Tarry Kos, MD;  Location: [facility] OR;  Service: Orthopedics;  Laterality: Left;   AMPUTATION Left 09/03/2021   Procedure: LISFRANC AMPUTATION;  Surgeon: Tarry Kos, MD;  Location: [facility] OR;  Service: Orthopedics;  Laterality: Left;   AMPUTATION Left 10/29/2021   Procedure: AMPUTATION BELOW KNEE -LEFT;  Surgeon: Tarry Kos, MD;  Location: [facility] OR;  Service: Orthopedics;  Laterality: Left;   APPLICATION OF WOUND VAC Left 07/07/2021   Procedure: APPLICATION OF WOUND VAC;  Surgeon: Tarry Kos, MD;  Location: [facility] OR;  Service: Orthopedics;  Laterality: Left;   APPLICATION OF WOUND VAC Left 08/29/2021   Procedure: APPLICATION OF WOUND VAC;  Surgeon: Tarry Kos, MD;  Location: [facility] OR;  Service: Orthopedics;  Laterality: Left;   I & D EXTREMITY Left 07/03/2021   Procedure: IRRIGATION AND DEBRIDEMENT ,FIFTH RAY  AMPUTATION LEFT FOOT, , WOUND VAC PLACEMENT;  Surgeon: Tarry Kos, MD;  Location: [facility] OR;  Service: Orthopedics;  Laterality: Left;   I & D EXTREMITY Left  07/07/2021   Procedure: IRRIGATION AND DEBRIDEMENT LEFT FOOT;  Surgeon: Tarry Kos, MD;  [facility]: [facility];  Service: Orthopedics;  Laterality: Left;   I & D EXTREMITY Left 08/29/2021   Procedure: IRRIGATION AND DEBRIDEMENT LEFT FOOT;  Surgeon: Tarry Kos, MD;  Location: [facility];  Service: Orthopedics;  Laterality: Left;   IR FLUORO GUIDE CV LINE RIGHT  07/09/2021   IR REMOVAL TUN CV CATH W/O FL  10/08/2021   IR REMOVAL TUN CV CATH W/O FL  07/13/2023   IR US GUIDE VASC ACCESS RIGHT  07/09/2021   VITRECTOMY Left    [place] [facility]   HPI:  Pt is a 52 year old male who presents with intermittent nausea and vomiting, fever, and clogged PEG tube. 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  Following [place] hospitalization, [facility] and was [facility] August 22 through August 10, 2023.  Worth mentioning is that he was decannulated on 07/23/2023, and weaned off to room air. With ongoing SLP intervention he was started on p.o. intake along with his tube feeds. OP MBS at [facility] (08/16/23) revealed swallow function WNL with recommendations for regular diet/thin liquids with supervision during PO intake given impulsivity. PMH: DM, PAF on Eliquis in the past, CKD now on dialysis, left BKA.    Assessment / Plan / Recommendation  Clinical Impression  Pt alert and repositioned upright in bed, wife at bedside assisting with hx. Both pt and wife confirmed that most recent MBS was 08/16/23, which revealed swallow to be WNL and he subsequently began consuming meals by mouth, no supplementation from PEG tube. He recently has been vomiting POs and wife reports it frequently occuring with solids and thin liquids. When consuming NTL, episodes of emesis are significantly reduced per her report. Oral mechanism examination this date significant for mild L facial asymmetry, otherwise WFL. He self-fed thin liquids by straw which  resulted in x1 overt cough after x5 consecutive sips, otherwise no overt s/sx of aspiration with single/limited sequential swallows of thins. Consumption of puree and regular textures was [facility]. Pt would prefer to consume thin liquids, however wife is concerned about this increasing frequency of emesis, thus voicing preference for nectar-thick liquids. Given clinical evaluation this date and recent MBS results, suspect pt would be OK to advance to regular diet/thin liquids when medically appropriate. For now, recommend clear liquid (thin) diet. RD to add nectar thick liquids as supplements to meal trays so pt has liberty to trial thin/NTL as desired. Will f/u briefly in order to assess diet tolerance/ability to advance, but also to provide simple swallow strategies/precautions to promote safety with intake.  SLP Visit Diagnosis: Dysphagia, unspecified (R13.10)    Aspiration Risk  Mild aspiration risk    Diet Recommendation Thin liquid;Nectar-thick liquid ((clear liquid diet given MD recommendations/recent vomiting, but can advance to regular diet/thin liquids as tolerated))    Liquid Administration via: Cup;Straw Medication Administration: Whole meds with liquid Supervision: Staff to assist with self feeding;Full supervision/cueing for compensatory strategies Compensations: Minimize environmental distractions;Slow rate;Small sips/bites Postural Changes: Seated upright at 90 degrees;Remain upright for at least 30 minutes after po intake    Other  Recommendations Oral Care Recommendations: Oral care BID    Recommendations for follow up therapy are one component of a multi-disciplinary discharge planning process, led by the attending physician.  Recommendations may be updated based on patient status, additional functional criteria and insurance authorization.  Follow up Recommendations  (likely no SLP f/u)      Assistance Recommended at Discharge    Functional Status Assessment Patient has had a  recent decline in their functional status and demonstrates the ability to make significant improvements in function in a reasonable and predictable amount of time.  Frequency and Duration min 2x/week  2 weeks       Prognosis Prognosis for improved oropharyngeal function: Good Barriers to Reach Goals: Time post onset      Swallow Study   General Date of Onset: 09/17/23 HPI: Pt is a 52 year old male who presents with intermittent nausea and vomiting, fever, and clogged PEG tube. 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  Following [place] hospitalization, [facility] and was there August 22 through August 10, 2023.  Worth mentioning is that he was decannulated on 07/23/2023, and weaned off to room air. With ongoing SLP intervention he was started on p.o. intake along with his tube feeds. OP MBS at [facility] (08/16/23) revealed swallow function WNL with recommendations for regular diet/thin liquids with supervision during PO intake given impulsivity. PMH: DM, PAF on Eliquis in the past, CKD now on dialysis, left BKA. Type of Study: Bedside Swallow Evaluation Previous Swallow Assessment: see HPI Diet Prior to this Study: Clear liquid diet (NTL) Temperature Spikes Noted: Yes (100.7) Respiratory Status: Room  air History of Recent Intubation: No Behavior/Cognition: Alert;Cooperative;Pleasant mood Oral Cavity Assessment: Within Functional Limits Oral Care Completed by SLP: No Oral Cavity - Dentition: Adequate natural dentition Vision: Functional for self-feeding Self-Feeding Abilities: Able to feed self;Needs assist Patient Positioning: Upright in bed;Postural control adequate for testing Baseline Vocal Quality: Normal Volitional Cough: Weak Volitional Swallow: Able to elicit    Oral/Motor/Sensory Function Overall Oral Motor/Sensory Function: Mild impairment Facial ROM: Within Functional  Limits Facial Symmetry: Abnormal symmetry left;Suspected CN VII (facial) dysfunction Facial Strength: Within Functional Limits Lingual ROM: Within Functional Limits Lingual Symmetry: Within Functional Limits   Ice Chips Ice chips: Not tested   Thin Liquid Thin Liquid: Within functional limits Presentation: Self Fed;Straw    Nectar Thick Nectar Thick Liquid: Within functional limits Presentation: Straw;Self Fed   Honey Thick Honey Thick Liquid: Not tested   Puree Puree: Within functional limits Presentation: Self Fed;Spoon   Solid     Solid: Within functional limits Presentation: Self Fed       Avie Echevaria, MA, CCC-SLP Acute Rehabilitation Services Office Number: [phone]  Paulette Blanch 09/18/2023,2:12 PM

## 2023-09-18 NOTE — ED Notes (Signed)
Pt's wife at bedside reporting slight left-sided facial droop that she feels is not normally present.  Wife states it may just be that pt is tired.  This RN noted slight droop mentioned by wife.  No other additional symptoms noted; pt denies headache.  No increased weakness noted and pt at same mental baseline as prior.  Goel MD paged by secretary to inform of same.

## 2023-09-18 NOTE — Plan of Care (Signed)
Not progressing 

## 2023-09-18 NOTE — Progress Notes (Signed)
PROGRESS NOTE  Bruce Little UXL:[id number] DOB: 1971-01-09 DOA: 09/17/2023 PCP: Westley Hummer, MD   LOS: 1 day   Brief Narrative / Interim history: This is a 52 year old male with DM, PAF on Eliquis in the past, CKD now on dialysis, left BKA who comes into the hospital with intermittent nausea and vomiting, fever, and clogged PEG tube.  He has had a prolonged hospitalization at [facility] in [place] in August, admitted there 04/30/2023 and discharged 07/01/2023.  Hospital course reviewed.  He was visiting [place] from [place], was in a hotel when he was found to have altered mental status, vomiting.  He was found to be hypertensive in the ER with a blood pressure of 226/100, and a CT of the head showed 9.2 x 5.5 cm right frontal temporal parenchymal bleed with edema, mass effect and 1.1 cm left midline shift.  He is status post craniectomy and hematoma evacuation and EVD placement.  Hospital course complicated by Staph epidermidis in the CSF 7/17, will repeat growth 7/22 and 7/24.  Eventually EVD was removed, and there were no plans to replace the bone flap and will need artificial plate eventually.  He will developed sacral decubitus ulcer and underwent serial debridements, sacral wounds grew E. coli, Morganella and Enterococcus faecalis and placed of antibiotics for several weeks.  Hospital course was also complicated by C. difficile diarrhea status post full course of vancomycin while being on IV antibiotics also, and in addition, had a PEG and a trach and developed renal failure requiring dialysis.  Following [place] hospitalization, [facility] and he was there August 22 through August 10, 2023.  Worth mentioning is that he was decannulated on 07/23/2023, and weaned off to room air, and also with ongoing SLP he was started on p.o. intake along with his PEG tube.  He has been in rehab through October, but apparently has been home for couple of  weeks prior to being here.  Subjective / 24h Interval events: Wakes up easily this morning, he has no complaints.  Denies any pain.  He denies any chest discomfort, nausea, vomiting, abdominal pain.  He denies any fever or chills.  There is no sore throat, runny nose, chest congestion.  Denies any diarrhea.  Assesement and Plan: Principal problem SIRS with fever, leukocytosis -patient was febrile to 100.7 on admission, and white count was 20K.  There were initial concerns for UTI, he is chronic Foley was replaced in the ER and he was started on ceftriaxone.  Cultures were sent, continue to monitor.  Apparently the fever has been on and off for quite some time and at 1 point it is mentioned in the chart that someone thought they may be attributed to neurogenic fevers.  He has Grown multiple bacteria in the wound as well as staph epi in the CSF when he had the EVD.  He also had C. difficile -ID consulted here, appreciate input.  White count jumped today to 30K  Active problems History of PAF-currently in sinus rhythm.  He is on amiodarone, Coreg, continue.  No longer on anticoagulation following his ICH in August  History of ICH-closely monitor mental status.  CT scan done last night does not show any evidence of recurrent bleed.  He has established with local neurosurgery, I do not have direct access to notes but there were plans in place from [facility] hospitalization to eventually have a plate fitted to his cranium  ESRD-on HD, however kidney numbers do  not look too bad.  Nephrology consulted  Chronic urinary retention-with chronic Foley  Stage IV sacral decubitus ulcer, POA-CT scan this admission showed resorption of the inferior sacrum and coccyx without abscess.  ID consulted as well.  Underwent debridement by general surgery in August, and no further debridements were recommended following his [facility] stay -wound consult  Hyponatremia-in the setting of renal disease  Hypokalemia-replace as  indicated.  Check magnesium  Anemia-of chronic renal disease as well as chronic illness.  Watch, transfuse for hemoglobin less than 7.  Clogged PEG tube-will need to establish how much p.o. intake he is taking.  Will discuss with wife.  Consult RD as well.  IR consulted today to assist with a PEG tube, however patient expressed the wishes for [place] to remove it  Status post trach-now decannulated, on room air  History of seizures-continue Keppra  Essential hypertension-continue antihypertensives as below, blood pressure has been stable  Type 2 diabetes mellitus-A1c pretty low, CBGs stable, discontinue glargine and keep on sliding scale for now  Lab Results  Component Value Date   HGBA1C 5.1 09/18/2023   CBG (last 3)  Recent Labs    09/18/23 0051 09/18/23 0311 09/18/23 0715  GLUCAP 91 80 89    Scheduled Meds:  amiodarone  100 mg Oral Daily   amLODipine  10 mg Oral Daily   atorvastatin  40 mg Oral QHS   carvedilol  25 mg Oral BID WC   cloNIDine  0.1 mg Oral TID   feeding supplement  1 Container Oral TID BM   finasteride  5 mg Oral Daily   hydrALAZINE  25 mg Oral Q8H   influenza vac split trivalent PF  0.5 mL Intramuscular Tomorrow-1000   insulin aspart  0-5 Units Subcutaneous QHS   insulin aspart  0-6 Units Subcutaneous TID WC   insulin glargine-yfgn  5 Units Subcutaneous BID   levETIRAcetam  1,500 mg Oral BID   losartan  50 mg Oral Daily   metoCLOPramide  5 mg Oral TID AC   pantoprazole  40 mg Oral BID   PARoxetine  10 mg Oral Daily   sodium chloride flush  3 mL Intravenous Q12H   terazosin  1 mg Oral QHS   Continuous Infusions:  cefTRIAXone (ROCEPHIN)  IV     dextrose 5% lactated ringers with KCl 20 mEq/L 75 mL/hr at 09/18/23 0157   PRN Meds:.acetaminophen **OR** acetaminophen, albuterol, ondansetron, polyethylene glycol  Current Outpatient Medications  Medication Instructions   albuterol (VENTOLIN HFA) 108 (90 Base) MCG/ACT inhaler 2 puffs, Inhalation, Every 4  hours PRN   amiodarone (PACERONE) 100 mg, Oral, Daily   amLODipine (NORVASC) 10 mg, Oral, Daily   atorvastatin (LIPITOR) 80 mg, Oral, Every evening   atorvastatin (LIPITOR) 40 mg, Oral, Daily at bedtime   carvedilol (COREG) 37.5 mg, Oral, 2 times daily with meals   cloNIDine (CATAPRES) 0.3 mg, Oral, 3 times daily   finasteride (PROSCAR) 5 mg, Oral, Daily   furosemide (LASIX) 40 mg, Oral, Daily   glipiZIDE (GLUCOTROL) 5 mg, Oral, Daily   hydrALAZINE (APRESOLINE) 25 mg, Oral, Every 8 hours   Lantus SoloStar 5 Units, Subcutaneous, Every 12 hours   levETIRAcetam (KEPPRA) 1,500 mg, Oral, 2 times daily   losartan (COZAAR) 100 mg, Oral, Daily   Melatonin 10 MG TABS 1 tablet, Oral, Daily at bedtime   ondansetron (ZOFRAN) 4 mg, Oral, Every 8 hours PRN   pantoprazole (PROTONIX) 40 mg, Oral, Daily   PARoxetine (PAXIL) 10 mg, Oral, Daily  terazosin (HYTRIN) 1 mg, Oral, Daily at bedtime    Diet Orders (From admission, onward)     Start     Ordered   09/17/23 2337  Diet clear liquid Room service appropriate? Yes; Fluid consistency: Nectar Thick  Diet effective now       Question Answer Comment  Room service appropriate? Yes   Fluid consistency: Nectar Thick      09/17/23 2337            DVT prophylaxis: SCDs Start: 09/17/23 2336   Lab Results  Component Value Date   PLT 246 09/18/2023      Code Status: Full Code  Family Communication: called wife [name] over the phone, no answer, left VM  Status is: Inpatient Remains inpatient appropriate because: severity of illness  Level of care: Telemetry Medical  Consultants:  Nephrology ID  Objective: Vitals:   09/18/23 0214 09/18/23 0252 09/18/23 0307 09/18/23 0824  BP:  131/79  (!) 114/59  Pulse:  73  68  Resp:  18  18  Temp: 98.7 F (37.1 C) 98.2 F (36.8 C)  98.4 F (36.9 C)  TempSrc: Oral     SpO2:  97%  100%  Weight:   93.6 kg   Height:   6\' 2"  (1.88 m)     Intake/Output Summary (Last 24 hours) at 09/18/2023  1610 Last data filed at 09/18/2023 0830 Gross per 24 hour  Intake 0 ml  Output --  Net 0 ml   Wt Readings from Last 3 Encounters:  09/18/23 93.6 kg  04/14/23 111.1 kg  10/29/21 86.1 kg    Examination:  Constitutional: NAD Eyes: no scleral icterus ENMT: Mucous membranes are moist.  Neck: normal, supple Respiratory: clear to auscultation bilaterally, no wheezing, no crackles. Normal respiratory effort. No accessory muscle use.  Cardiovascular: Regular rate and rhythm, no murmurs / rubs / gallops. No LE edema.  Abdomen: non distended, no tenderness. Bowel sounds positive.  PEG tube in place Musculoskeletal: no clubbing / cyanosis.  Skin: no rashes  Data Reviewed: I have independently reviewed following labs and imaging studies   CBC Recent Labs  Lab 09/17/23 1006 09/18/23 0058  WBC 20.4* 30.2*  HGB 7.9* 7.1*  HCT 24.3* 21.9*  PLT 276 246  MCV 94.6 95.2  MCH 30.7 30.9  MCHC 32.5 32.4  RDW 15.9* 16.0*  LYMPHSABS  --  1.7  MONOABS  --  2.1*  EOSABS  --  0.0  BASOSABS  --  0.1    Recent Labs  Lab 09/17/23 1006 09/18/23 0058  NA 132* 130*  K 3.1* 3.1*  CL 96* 94*  CO2 27 23  GLUCOSE 142* 86  BUN 9 12  CREATININE 1.69* 1.85*  CALCIUM 7.8* 7.4*  AST 27  --   ALT 30  --   ALKPHOS 106  --   BILITOT 0.8  --   ALBUMIN 1.6*  --   INR  --  1.1  HGBA1C  --  5.1    ------------------------------------------------------------------------------------------------------------------ No results for input(s): "CHOL", "HDL", "LDLCALC", "TRIG", "CHOLHDL", "LDLDIRECT" in the last 72 hours.  Lab Results  Component Value Date   HGBA1C 5.1 09/18/2023   ------------------------------------------------------------------------------------------------------------------ No results for input(s): "TSH", "T4TOTAL", "T3FREE", "THYROIDAB" in the last 72 hours.  Invalid input(s): "FREET3"  Cardiac Enzymes No results for input(s): "CKMB", "TROPONINI", "MYOGLOBIN" in the last 168  hours.  Invalid input(s): "CK" ------------------------------------------------------------------------------------------------------------------ No results found for: "BNP"  CBG: Recent Labs  Lab 09/18/23 0051 09/18/23 0311  09/18/23 0715  GLUCAP 91 80 89    No results found for this or any previous visit (from the past 240 hour(s)).   Radiology Studies: CT HEAD WO CONTRAST ( )  Result Date: 09/18/2023 CLINICAL DATA:  Stroke in June. Left-sided paralysis. Sent to ED by primary care. EXAM: CT HEAD WITHOUT CONTRAST TECHNIQUE: Contiguous axial images were obtained from the base of the skull through the vertex without intravenous contrast. RADIATION DOSE REDUCTION: This exam was performed according to the departmental dose-optimization program which includes automated exposure control, adjustment of the mA and/or kV according to patient size and/or use of iterative reconstruction technique. COMPARISON:  MRI head 05/19/2021. FINDINGS: Brain: No intracranial hemorrhage, mass effect, or evidence of acute infarct. Extensive encephalomalacia in the right cerebral hemisphere compatible with chronic infarct. Ex vacuo dilatation ventricles. Basal cisterns are patent. No extra-axial fluid collection. Age-commensurate cerebral atrophy and chronic small vessel ischemic disease. Vascular: No hyperdense vessel. Intracranial arterial calcification. Skull: No fracture or focal lesion.  Right craniectomy. Sinuses/Orbits: Opacification of the few mastoid air cells bilaterally. Mucosal thickening left maxillary sinus. Other: None. IMPRESSION: 1. No acute intracranial abnormality. 2. Extensive chronic infarct in the right cerebral hemisphere. Electronically Signed   By: Minerva Fester M.D.   On: 09/18/2023 03:19   CT ABDOMEN PELVIS W CONTRAST  Result Date: 09/17/2023 CLINICAL DATA:  Nausea, vomiting, possible UTI.  GI tube clogged EXAM: CT ABDOMEN AND PELVIS WITH CONTRAST TECHNIQUE: Multidetector CT imaging of  the abdomen and pelvis was performed using the standard protocol following bolus administration of intravenous contrast. RADIATION DOSE REDUCTION: This exam was performed according to the departmental dose-optimization program which includes automated exposure control, adjustment of the mA and/or kV according to patient size and/or use of iterative reconstruction technique. CONTRAST:  75mL OMNIPAQUE IOHEXOL 350 MG/ML SOLN COMPARISON:  Renal ultrasound 08/04/2023 and radiographs 08/02/2023 FINDINGS: Lower chest: Small left-greater-than-right pleural effusions and associated atelectasis. Hepatobiliary: Mild distention of the gallbladder. Hyperdense layering sludge in the gallbladder. No biliary dilation. Liver is unremarkable. Pancreas: Unremarkable. Spleen: Unremarkable. Adrenals/Urinary Tract: Unremarkable adrenal glands. No urinary calculi or hydronephrosis. Foley catheter in the nondistended bladder. Stomach/Bowel: Normal caliber large and small bowel. Moderate colonic stool load. Normal appendix. Stomach is within normal limits. Expected location of the percutaneous gastrostomy tube in the stomach. Vascular/Lymphatic: Aortic atherosclerosis. No enlarged abdominal or pelvic lymph nodes. Reproductive: No acute abnormality. Other: Smaller free fluid in the pelvis. No free intraperitoneal air. Musculoskeletal: Resorption of the inferior sacrum and coccyx compatible with osteomyelitis. There is a large sacral decubitus ulcer measuring 7.4 x 6.6 x 3.9 cm and abutting the inferior portion of the remaining sacrum. Adjacent fat stranding and subcutaneous and presacral edema. No abscess. No soft tissue gas. IMPRESSION: 1. Large sacral decubitus ulcer with resorption of the inferior sacrum and coccyx. No abscess. 2. Small left-greater-than-right pleural effusions and associated atelectasis. 3. Expected location of the percutaneous gastrostomy tube in the stomach. 4. Foley catheter in the nondistended bladder. Aortic  Atherosclerosis (ICD10-I70.0). Electronically Signed   By: Minerva Fester M.D.   On: 09/17/2023 20:35     Pamella Pert, MD, PhD [facility]  Between 7 am - 7 pm I am available, please contact me via Amion (for emergencies) or Securechat (non urgent messages)  Between 7 pm - 7 am I am not available, please contact night coverage MD/APP via Amion

## 2023-09-18 NOTE — Consult Note (Signed)
[facility]  Total days of antibiotics 2 Reason for Consult: fevers    Referring Physician: gherghe  Principal Problem:   Vomiting Active Problems:   Renal insufficiency   HTN (hypertension)   UTI (urinary tract infection)   Leukocytosis   Sacral decubitus ulcer   ICH (intracerebral hemorrhage) (HCC)    HPI: Bruce Little is a 52 y.o. male with complex history of having recent right sided hemorrhagic stroke, with left sided hemiplegia s/p trach s/p peg, s/p craniotomy (at bone flap still at [facility]) - 2 month hospitalization, complicated by staph epi menigitis,wound dehiscence at craniotomy incision. Also during this prolonged hospitalization course developed sacral decub ulcer, polymicrobial, treated with 6 wk of iv abtx. Also cdiff. Has been home from rehab for the past 2 weeks. Has had intermittent nausea an vomiting,not responding to zofran. Also not using peg since clogged. He was last treated with doxycycline 100mg  po bid x 7 day through 10/28. While at rehab, he was also having low grade fevers that were deemed neurologic fevers.  He is getting established with dr Conchita Paris for flap redo and getting back into care locally. He had acute onset of fevers 100.44F as well as nausea and vomiting which brought him into the hospital. His foley catheter was also changed out as possible source of infection. Ua showed pyuria.blood cx pending. But did have leukocytosis.  Wife has been caring for patient/wound dressing changes. Sacral decub not having significant drainage. Imaging doesn't suggest abscess or osteomyelitis.  Past Medical History:  Diagnosis Date   A-fib (HCC)    Anemia    low iron   Chronic kidney disease    COVID    has had it 2 times, one mild and one wasn't   DM2 (diabetes mellitus, type 2) (HCC)    History of blood transfusion    HTN (hypertension)    Osteomyelitis of fifth toe of left foot (HCC) 08/13/2021   Osteomyelitis  of fourth toe of left foot (HCC) 08/13/2021   Pneumonia    Stroke (HCC) 05/19/2021   no residual effects.   Stroke [facility])     Allergies: No Known Allergies  MEDICATIONS:  amiodarone  100 mg Oral Daily   amLODipine  10 mg Oral Daily   atorvastatin  40 mg Oral QHS   carvedilol  25 mg Oral BID WC   [START ON 09/19/2023] Chlorhexidine Gluconate Cloth  6 each Topical Q0600   cloNIDine  0.1 mg Oral TID   darbepoetin (ARANESP) injection - DIALYSIS  60 mcg Subcutaneous Q Sat-1800   feeding supplement  1 Container Oral TID BM   finasteride  5 mg Oral Daily   hydrALAZINE  25 mg Oral Q8H   influenza vac split trivalent PF  0.5 mL Intramuscular Tomorrow-1000   insulin aspart  0-5 Units Subcutaneous QHS   insulin aspart  0-6 Units Subcutaneous TID WC   insulin glargine-yfgn  5 Units Subcutaneous BID   levETIRAcetam  1,500 mg Oral BID   losartan  50 mg Oral Daily   metoCLOPramide  5 mg Oral TID AC   multivitamin  1 tablet Oral QHS   ondansetron (ZOFRAN) IV  4 mg Intravenous Q12H   pantoprazole  40 mg Oral BID   PARoxetine  10 mg Oral Daily   sodium chloride flush  3 mL Intravenous Q12H   terazosin  1 mg Oral QHS    Social History   Tobacco Use   Smoking  status: Never   Smokeless tobacco: Never  Vaping Use   Vaping status: Never Used  Substance Use Topics   Alcohol use: Not Currently    Comment: rare   Drug use: Never    Family History  Problem Relation Age of Onset   Stroke Mother    Cancer Mother    Heart disease Father     Review of Systems -  12 point ros is negative except what is mentioned above  OBJECTIVE: Temp:  [98.2 F (36.8 C)-100.7 F (38.2 C)] 98.4 F (36.9 C) (11/09 0824) Pulse Rate:  [68-81] 68 (11/09 0824) Resp:  [11-22] 18 (11/09 0824) BP: (102-147)/(59-91) 114/59 (11/09 0824) SpO2:  [93 %-100 %] 100 % (11/09 0824) Weight:  [93.6 kg] 93.6 kg (11/09 0307) Physical Exam  Constitutional: He is oriented to person, place, and time. He appears  well-developed and well-nourished. No distress.  HENT:  Mouth/Throat: Oropharynx is clear and moist. No oropharyngeal exudate.  Cardiovascular: Normal rate, regular rhythm and normal heart sounds. Exam reveals no gallop and no friction rub.  No murmur heard.  Pulmonary/Chest: Effort normal and breath sounds normal. No respiratory distress. He has no wheezes.  Abdominal: Soft. Bowel sounds are normal. He exhibits no distension. There is no tenderness.  Lymphadenopathy:  He has no cervical adenopathy.  Neurological: He is alert and oriented to person, place, and time. Left sided weakness. bka Skin: Skin is warm and dry. No rash noted. No erythema.  Psychiatric: He has a normal mood and affect. His behavior is normal.    LABS: Results for orders placed or performed during the hospital encounter of 09/17/23 (from the past 48 hour(s))  Urinalysis, Routine w reflex microscopic -Urine, Random     Status: Abnormal   Collection Time: 09/17/23 10:04 AM  Result Value Ref Range   Color, Urine YELLOW YELLOW   APPearance CLOUDY (A) CLEAR   Specific Gravity, Urine 1.025 1.005 - 1.030   pH 8.0 5.0 - 8.0   Glucose, UA 50 (A) NEGATIVE mg/dL   Hgb urine dipstick MODERATE (A) NEGATIVE   Bilirubin Urine NEGATIVE NEGATIVE   Ketones, ur NEGATIVE NEGATIVE mg/dL   Protein, ur 161 (A) NEGATIVE mg/dL   Nitrite NEGATIVE NEGATIVE   Leukocytes,Ua LARGE (A) NEGATIVE   RBC / HPF >50 0 - 5 RBC/hpf   WBC, UA >50 0 - 5 WBC/hpf   Bacteria, UA RARE (A) NONE SEEN   Squamous Epithelial / HPF 0-5 0 - 5 /HPF   WBC Clumps PRESENT     Comment: Performed at [facility], [address], [place], [place] [address]  Lipase, blood     Status: None   Collection Time: 09/17/23 10:06 AM  Result Value Ref Range   Lipase 43 11 - 51 U/L    Comment: Performed at [facility], [address] [place], [place] [address]  Comprehensive metabolic panel     Status: Abnormal   Collection Time: 09/17/23 10:06 AM  Result  Value Ref Range   Sodium 132 (L) 135 - 145 mmol/L   Potassium 3.1 (L) 3.5 - 5.1 mmol/L   Chloride 96 (L) 98 - 111 mmol/L   CO2 27 22 - 32 mmol/L   Glucose, Bld 142 (H) 70 - 99 mg/dL    Comment: Glucose reference range applies only to samples taken after fasting for at least 8 hours.   BUN 9 6 - 20 mg/dL   Creatinine, Ser 1.19 (H) 0.61 - 1.24 mg/dL  Calcium 7.8 (L) 8.9 - 10.3 mg/dL   Total Protein 5.4 (L) 6.5 - 8.1 g/dL   Albumin 1.6 (L) 3.5 - 5.0 g/dL   AST 27 15 - 41 U/L   ALT 30 0 - 44 U/L   Alkaline Phosphatase 106 38 - 126 U/L   Total Bilirubin 0.8 <1.2 mg/dL   GFR, Estimated 49 (L) >60 mL/min    Comment: (NOTE) Calculated using the CKD-EPI Creatinine Equation (2021)    Anion gap 9 5 - 15    Comment: Performed at [facility], [address], [place], [place] [address]  CBC     Status: Abnormal   Collection Time: 09/17/23 10:06 AM  Result Value Ref Range   WBC 20.4 (H) 4.0 - 10.5 K/uL   RBC 2.57 (L) 4.22 - 5.81 MIL/uL   Hemoglobin 7.9 (L) 13.0 - 17.0 g/dL   HCT 65.7 (L) 84.6 - 96.2 %   MCV 94.6 80.0 - 100.0 fL   MCH 30.7 26.0 - 34.0 pg   MCHC 32.5 30.0 - 36.0 g/dL   RDW 95.2 (H) 84.1 - 32.4 %   Platelets 276 150 - 400 K/uL   nRBC 0.0 0.0 - 0.2 %    Comment: Performed at [facility], [address], [place], [place] [address]  Blood culture (routine x 2)     Status: None (Preliminary result)   Collection Time: 09/17/23  8:15 PM   Specimen: BLOOD RIGHT HAND  Result Value Ref Range   Specimen Description BLOOD RIGHT HAND    Special Requests      BOTTLES DRAWN AEROBIC AND ANAEROBIC Blood Culture results may not be optimal due to an inadequate volume of blood received in culture bottles   Culture      NO GROWTH < 12 HOURS Performed at [facility], [address], [place], [place] [address]    Report Status PENDING   Blood culture (routine x 2)     Status: None (Preliminary result)   Collection Time: 09/17/23  8:24 PM   Specimen: BLOOD RIGHT ARM   Result Value Ref Range   Specimen Description BLOOD RIGHT ARM    Special Requests      BOTTLES DRAWN AEROBIC AND ANAEROBIC Blood Culture adequate volume   Culture      NO GROWTH < 12 HOURS Performed at [facility], [address] [place], [place] [address]    Report Status PENDING   CBG monitoring, ED     Status: None   Collection Time: 09/18/23 12:51 AM  Result Value Ref Range   Glucose-Capillary 91 70 - 99 mg/dL    Comment: Glucose reference range applies only to samples taken after fasting for at least 8 hours.  HIV Antibody (routine testing w rflx)     Status: None   Collection Time: 09/18/23 12:58 AM  Result Value Ref Range   HIV Screen 4th Generation wRfx Non Reactive Non Reactive    Comment: Performed at [facility], [address] [place], [place] [address]  APTT     Status: None   Collection Time: 09/18/23 12:58 AM  Result Value Ref Range   aPTT 29 24 - 36 seconds    Comment: Performed at [facility], [address], [place], [place] [address]  Protime-INR     Status: None   Collection Time: 09/18/23 12:58 AM  Result Value Ref Range   Prothrombin Time 14.8 11.4 - 15.2  seconds   INR 1.1 0.8 - 1.2    Comment: (NOTE) INR goal varies based on device and disease states. Performed at [facility], [address], [place], [place] [address]   Basic metabolic panel     Status: Abnormal   Collection Time: 09/18/23 12:58 AM  Result Value Ref Range   Sodium 130 (L) 135 - 145 mmol/L   Potassium 3.1 (L) 3.5 - 5.1 mmol/L   Chloride 94 (L) 98 - 111 mmol/L   CO2 23 22 - 32 mmol/L   Glucose, Bld 86 70 - 99 mg/dL    Comment: Glucose reference range applies only to samples taken after fasting for at least 8 hours.   BUN 12 6 - 20 mg/dL   Creatinine, Ser 4.40 (H) 0.61 - 1.24 mg/dL   Calcium 7.4 (L) 8.9 - 10.3 mg/dL   GFR, Estimated 44 (L) >60 mL/min    Comment: (NOTE) Calculated using the CKD-EPI Creatinine Equation (2021)    Anion gap 13 5 - 15     Comment: Performed at [facility], [address], [place], [place] [address]  CBC with Differential/Platelet     Status: Abnormal   Collection Time: 09/18/23 12:58 AM  Result Value Ref Range   WBC 30.2 (H) 4.0 - 10.5 K/uL   RBC 2.30 (L) 4.22 - 5.81 MIL/uL   Hemoglobin 7.1 (L) 13.0 - 17.0 g/dL   HCT 59.5 (L) 63.8 - 75.6 %   MCV 95.2 80.0 - 100.0 fL   MCH 30.9 26.0 - 34.0 pg   MCHC 32.4 30.0 - 36.0 g/dL   RDW 43.3 (H) 29.5 - 18.8 %   Platelets 246 150 - 400 K/uL   nRBC 0.0 0.0 - 0.2 %   Neutrophils Relative % 86 %   Neutro Abs 26.0 (H) 1.7 - 7.7 K/uL   Lymphocytes Relative 6 %   Lymphs Abs 1.7 0.7 - 4.0 K/uL   Monocytes Relative 7 %   Monocytes Absolute 2.1 (H) 0.1 - 1.0 K/uL   Eosinophils Relative 0 %   Eosinophils Absolute 0.0 0.0 - 0.5 K/uL   Basophils Relative 0 %   Basophils Absolute 0.1 0.0 - 0.1 K/uL   Immature Granulocytes 1 %   Abs Immature Granulocytes 0.28 (H) 0.00 - 0.07 K/uL    Comment: Performed at [facility], [address] [place], [place] [address]  Sedimentation rate     Status: Abnormal   Collection Time: 09/18/23 12:58 AM  Result Value Ref Range   Sed Rate 54 (H) 0 - 16 mm/hr    Comment: Performed at [facility], [address], [place], [place] [address]  Hemoglobin A1c     Status: None   Collection Time: 09/18/23 12:58 AM  Result Value Ref Range   Hgb A1c MFr Bld 5.1 4.8 - 5.6 %    Comment: (NOTE) Pre diabetes:          5.7%-6.4%  Diabetes:              >6.4%  Glycemic control for   <7.0% adults with diabetes    Mean Plasma Glucose 99.67 mg/dL    Comment: Performed at [facility], [address], [place], [place] [address]  Troponin I (High Sensitivity)     Status: Abnormal   Collection Time: 09/18/23 12:58 AM  Result Value Ref Range   Troponin I (High Sensitivity) 42 (H) <18 ng/L    Comment: (NOTE)  Elevated high sensitivity troponin I (hsTnI) values and significant  changes across serial measurements may  suggest ACS but many other  chronic and acute conditions are known to elevate hsTnI results.  Refer to the "Links" section for chest pain algorithms and additional  guidance. Performed at [facility], [address], [place], [place] [address]   Glucose, capillary     Status: None   Collection Time: 09/18/23  3:11 AM  Result Value Ref Range   Glucose-Capillary 80 70 - 99 mg/dL    Comment: Glucose reference range applies only to samples taken after fasting for at least 8 hours.  Glucose, capillary     Status: None   Collection Time: 09/18/23  7:15 AM  Result Value Ref Range   Glucose-Capillary 89 70 - 99 mg/dL    Comment: Glucose reference range applies only to samples taken after fasting for at least 8 hours.  Glucose, capillary     Status: Abnormal   Collection Time: 09/18/23 11:09 AM  Result Value Ref Range   Glucose-Capillary 103 (H) 70 - 99 mg/dL    Comment: Glucose reference range applies only to samples taken after fasting for at least 8 hours.  Hepatitis B surface antigen     Status: None   Collection Time: 09/18/23 12:17 PM  Result Value Ref Range   Hepatitis B Surface Ag NON REACTIVE NON REACTIVE    Comment: Performed at [facility], [address] [place], [place] [address]    MICRO:  IMAGING: CT HEAD WO CONTRAST ( )  Result Date: 09/18/2023 CLINICAL DATA:  Stroke in June. Left-sided paralysis. Sent to ED by primary care. EXAM: CT HEAD WITHOUT CONTRAST TECHNIQUE: Contiguous axial images were obtained from the base of the skull through the vertex without intravenous contrast. RADIATION DOSE REDUCTION: This exam was performed according to the departmental dose-optimization program which includes automated exposure control, adjustment of the mA and/or kV according to patient size and/or use of iterative reconstruction technique. COMPARISON:  MRI head 05/19/2021. FINDINGS: Brain: No intracranial hemorrhage, mass effect, or evidence of acute infarct. Extensive  encephalomalacia in the right cerebral hemisphere compatible with chronic infarct. Ex vacuo dilatation ventricles. Basal cisterns are patent. No extra-axial fluid collection. Age-commensurate cerebral atrophy and chronic small vessel ischemic disease. Vascular: No hyperdense vessel. Intracranial arterial calcification. Skull: No fracture or focal lesion.  Right craniectomy. Sinuses/Orbits: Opacification of the few mastoid air cells bilaterally. Mucosal thickening left maxillary sinus. Other: None. IMPRESSION: 1. No acute intracranial abnormality. 2. Extensive chronic infarct in the right cerebral hemisphere. Electronically Signed   By: Minerva Fester M.D.   On: 09/18/2023 03:19   CT ABDOMEN PELVIS W CONTRAST  Result Date: 09/17/2023 CLINICAL DATA:  Nausea, vomiting, possible UTI.  GI tube clogged EXAM: CT ABDOMEN AND PELVIS WITH CONTRAST TECHNIQUE: Multidetector CT imaging of the abdomen and pelvis was performed using the standard protocol following bolus administration of intravenous contrast. RADIATION DOSE REDUCTION: This exam was performed according to the departmental dose-optimization program which includes automated exposure control, adjustment of the mA and/or kV according to patient size and/or use of iterative reconstruction technique. CONTRAST:  75mL OMNIPAQUE IOHEXOL 350 MG/ML SOLN COMPARISON:  Renal ultrasound 08/04/2023 and radiographs 08/02/2023 FINDINGS: Lower chest: Small left-greater-than-right pleural effusions and associated atelectasis. Hepatobiliary: Mild distention of the gallbladder. Hyperdense layering sludge in the gallbladder. No biliary dilation. Liver is unremarkable. Pancreas: Unremarkable. Spleen: Unremarkable. Adrenals/Urinary Tract: Unremarkable adrenal glands. No urinary calculi or hydronephrosis. Foley catheter in the  nondistended bladder. Stomach/Bowel: Normal caliber large and small bowel. Moderate colonic stool load. Normal appendix. Stomach is within normal limits.  Expected location of the percutaneous gastrostomy tube in the stomach. Vascular/Lymphatic: Aortic atherosclerosis. No enlarged abdominal or pelvic lymph nodes. Reproductive: No acute abnormality. Other: Smaller free fluid in the pelvis. No free intraperitoneal air. Musculoskeletal: Resorption of the inferior sacrum and coccyx compatible with osteomyelitis. There is a large sacral decubitus ulcer measuring 7.4 x 6.6 x 3.9 cm and abutting the inferior portion of the remaining sacrum. Adjacent fat stranding and subcutaneous and presacral edema. No abscess. No soft tissue gas. IMPRESSION: 1. Large sacral decubitus ulcer with resorption of the inferior sacrum and coccyx. No abscess. 2. Small left-greater-than-right pleural effusions and associated atelectasis. 3. Expected location of the percutaneous gastrostomy tube in the stomach. 4. Foley catheter in the nondistended bladder. Aortic Atherosclerosis (ICD10-I70.0). Electronically Signed   By: Minerva Fester M.D.   On: 09/17/2023 20:35     Assessment/Plan:  52yo M with hx of prolonged hospitalization for hemorrhagic stroke requiring right sided craniotomy, had sacral decub ulcer infection, craniotomy site wound dehiscence/meningitis, cdifficile, hx of CAUTI, now presents with fevers, nausea and vomiting with leukocytosis. Concern for multiple source as causes of his presentation.  - continue on ceftriaxone for the time being, covering uropathogens - will follow cultures  - I am not terribly impressed with sacral wound but feel that it does need to continue with local wound care such as vashe as wet to dry bid  - continue with offloading  - severe protein calorie malnutrition = will add juven and nutrition consult  - if ongoing fevers, may need to repeat brain w and wo contrast mri  -in terms of persistent nausea and vomiting, may need to be evaluated for gastroparesis, or do a trial of empiric treatment to see if that helps his symptoms.   Will continue  to follow. And further recs tomorrow.  Duke Salvia Drue Second MD MPH [facility] [phone]

## 2023-09-18 NOTE — ED Notes (Signed)
ED TO INPATIENT HANDOFF REPORT  ED Nurse Name and Phone #: Joselyn Glassman [phone])  S Name/Age/Gender Bruce Little 52 y.o. male Room/Bed: 015C/015C  Code Status   Code Status: Full Code  Home/SNF/Other Home Patient oriented to: self, place, time, and situation Is this baseline? 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 And now home. Left side paralysis. He also has a sacrum wound from the ICU .He has N/V which his GI tube is clogged,this has been going on since he was in [employer].  and has sediments in his urinary bag. So he might have a UTI. His primary care Dr. Rhina Brackett him here.   Allergies No Known Allergies  Level of Care/Admitting Diagnosis ED Disposition     ED Disposition  Admit   Condition  --   Comment  Hospital Area: [facility] [[id number]]  Level of Care: Telemetry Medical [104]  May admit patient to [facility] or [facility] if equivalent level of care is available:: No  Covid Evaluation: Asymptomatic - no recent exposure (last 10 days) testing not required  Diagnosis: Vomiting [[id number]]  Admitting Physician: Nolberto Hanlon [[id number]]  Attending Physician: Nolberto Hanlon [[id number]]  Certification:: I certify this patient will need inpatient services for at least 2 midnights  Expected Medical Readiness: 09/20/2023          B Medical/Surgery History Past Medical History:  Diagnosis Date   A-fib (HCC)    Anemia    low iron   Chronic kidney disease    COVID    has had it 2 times, one mild and one wasn't   DM2 (diabetes mellitus, type 2) (HCC)    History of blood transfusion    HTN (hypertension)    Osteomyelitis of fifth toe of left foot (HCC) 08/13/2021   Osteomyelitis of fourth toe of left foot (HCC) 08/13/2021   Pneumonia    Stroke  (HCC) 05/19/2021   no residual effects.   Stroke [facility])    Past Surgical History:  Procedure Laterality Date   AMPUTATION Left 08/29/2021   Procedure: AMPUTATION OF FOURTH TOE AND RAY ALONG WITH REMAINING FITH METATARSAL;  Surgeon: Tarry Kos, MD;  Location: [facility] OR;  Service: Orthopedics;  Laterality: Left;   AMPUTATION Left 09/03/2021   Procedure: LISFRANC AMPUTATION;  Surgeon: Tarry Kos, MD;  Location: [facility] OR;  Service: Orthopedics;  Laterality: Left;   AMPUTATION Left 10/29/2021   Procedure: AMPUTATION BELOW KNEE -LEFT;  Surgeon: Tarry Kos, MD;  Location: [facility] OR;  Service: Orthopedics;  Laterality: Left;   APPLICATION OF WOUND VAC Left 07/07/2021   Procedure: APPLICATION OF WOUND VAC;  Surgeon: Tarry Kos, MD;  Location: [facility] OR;  Service: Orthopedics;  Laterality: Left;   APPLICATION OF WOUND VAC Left 08/29/2021   Procedure: APPLICATION OF WOUND VAC;  Surgeon: Tarry Kos, MD;  Location: [facility] OR;  Service: Orthopedics;  Laterality: Left;   I & D EXTREMITY Left 07/03/2021   Procedure: IRRIGATION AND DEBRIDEMENT ,FIFTH RAY  AMPUTATION LEFT FOOT, , WOUND VAC PLACEMENT;  Surgeon: Tarry Kos, MD;  Location: [facility];  Service: Orthopedics;  Laterality: Left;   I & D EXTREMITY Left 07/07/2021   Procedure: IRRIGATION AND DEBRIDEMENT LEFT FOOT;  Surgeon: Tarry Kos,  MD;  Location: [facility];  Service: Orthopedics;  Laterality: Left;   I & D EXTREMITY Left 08/29/2021   Procedure: IRRIGATION AND DEBRIDEMENT LEFT FOOT;  Surgeon: Tarry Kos, MD;  Location: [facility];  Service: Orthopedics;  Laterality: Left;   IR FLUORO GUIDE CV LINE RIGHT  07/09/2021   IR REMOVAL TUN CV CATH W/O FL  10/08/2021   IR REMOVAL TUN CV CATH W/O FL  07/13/2023   IR US GUIDE VASC ACCESS RIGHT  07/09/2021   VITRECTOMY Left    [place] [facility]     A IV Location/Drains/Wounds Patient Lines/Drains/Airways Status     Active Line/Drains/Airways     Name Placement date Placement time Site Days   Peripheral IV 09/17/23  20 G Anterior;Right Forearm 09/17/23  1548  Forearm  1   Peripheral IV 09/17/23 20 G Right Hand 09/17/23  2026  Hand  1   Tunneled CVC Double Lumen (Radiology) 07/09/21 Right Internal jugular 22 cm 07/09/21  1059  -- 801   Negative Pressure Wound Therapy Foot Anterior;Left 07/10/21  1030  --  800   Negative Pressure Wound Therapy Foot Left 08/29/21  1418  --  750   Wound / Incision (Open or Dehisced) 07/02/21 Toe (Comment  which one) Anterior;Left cellulitis 07/02/21  1700  Toe (Comment  which one)  808            Intake/Output Last 24 hours No intake or output data in the 24 hours ending 09/18/23 0049  Labs/Imaging Results for orders placed or performed during the hospital encounter of 09/17/23 (from the past 48 hour(s))  Urinalysis, Routine w reflex microscopic -Urine, Random     Status: Abnormal   Collection Time: 09/17/23 10:04 AM  Result Value Ref Range   Color, Urine YELLOW YELLOW   APPearance CLOUDY (A) CLEAR   Specific Gravity, Urine 1.025 1.005 - 1.030   pH 8.0 5.0 - 8.0   Glucose, UA 50 (A) NEGATIVE mg/dL   Hgb urine dipstick MODERATE (A) NEGATIVE   Bilirubin Urine NEGATIVE NEGATIVE   Ketones, ur NEGATIVE NEGATIVE mg/dL   Protein, ur 161 (A) NEGATIVE mg/dL   Nitrite NEGATIVE NEGATIVE   Leukocytes,Ua LARGE (A) NEGATIVE   RBC / HPF >50 0 - 5 RBC/hpf   WBC, UA >50 0 - 5 WBC/hpf   Bacteria, UA RARE (A) NONE SEEN   Squamous Epithelial / HPF 0-5 0 - 5 /HPF   WBC Clumps PRESENT     Comment: Performed at [facility], [address] [place], [place] [address]  Lipase, blood     Status: None   Collection Time: 09/17/23 10:06 AM  Result Value Ref Range   Lipase 43 11 - 51 U/L    Comment: Performed at [facility], [address], [place], [place] [address]  Comprehensive metabolic panel     Status: Abnormal   Collection Time: 09/17/23 10:06 AM  Result Value Ref Range   Sodium 132 (L) 135 - 145 mmol/L   Potassium 3.1 (L) 3.5 - 5.1 mmol/L   Chloride 96 (L)  98 - 111 mmol/L   CO2 27 22 - 32 mmol/L   Glucose, Bld 142 (H) 70 - 99 mg/dL    Comment: Glucose reference range applies only to samples taken after fasting for at least 8 hours.   BUN 9 6 - 20 mg/dL   Creatinine, Ser 1.19 (H) 0.61 - 1.24 mg/dL   Calcium 7.8 (L) 8.9 - 10.3 mg/dL  Total Protein 5.4 (L) 6.5 - 8.1 g/dL   Albumin 1.6 (L) 3.5 - 5.0 g/dL   AST 27 15 - 41 U/L   ALT 30 0 - 44 U/L   Alkaline Phosphatase 106 38 - 126 U/L   Total Bilirubin 0.8 <1.2 mg/dL   GFR, Estimated 49 (L) >60 mL/min    Comment: (NOTE) Calculated using the CKD-EPI Creatinine Equation (2021)    Anion gap 9 5 - 15    Comment: Performed at [facility], [address] [place], [place] [address]  CBC     Status: Abnormal   Collection Time: 09/17/23 10:06 AM  Result Value Ref Range   WBC 20.4 (H) 4.0 - 10.5 K/uL   RBC 2.57 (L) 4.22 - 5.81 MIL/uL   Hemoglobin 7.9 (L) 13.0 - 17.0 g/dL   HCT 19.1 (L) 47.8 - 29.5 %   MCV 94.6 80.0 - 100.0 fL   MCH 30.7 26.0 - 34.0 pg   MCHC 32.5 30.0 - 36.0 g/dL   RDW 62.1 (H) 30.8 - 65.7 %   Platelets 276 150 - 400 K/uL   nRBC 0.0 0.0 - 0.2 %    Comment: Performed at [facility], [address], [place], [place] [address]   CT ABDOMEN PELVIS W CONTRAST  Result Date: 09/17/2023 CLINICAL DATA:  Nausea, vomiting, possible UTI.  GI tube clogged EXAM: CT ABDOMEN AND PELVIS WITH CONTRAST TECHNIQUE: Multidetector CT imaging of the abdomen and pelvis was performed using the standard protocol following bolus administration of intravenous contrast. RADIATION DOSE REDUCTION: This exam was performed according to the departmental dose-optimization program which includes automated exposure control, adjustment of the mA and/or kV according to patient size and/or use of iterative reconstruction technique. CONTRAST:  75mL OMNIPAQUE IOHEXOL 350 MG/ML SOLN COMPARISON:  Renal ultrasound 08/04/2023 and radiographs 08/02/2023 FINDINGS: Lower chest: Small left-greater-than-right  pleural effusions and associated atelectasis. Hepatobiliary: Mild distention of the gallbladder. Hyperdense layering sludge in the gallbladder. No biliary dilation. Liver is unremarkable. Pancreas: Unremarkable. Spleen: Unremarkable. Adrenals/Urinary Tract: Unremarkable adrenal glands. No urinary calculi or hydronephrosis. Foley catheter in the nondistended bladder. Stomach/Bowel: Normal caliber large and small bowel. Moderate colonic stool load. Normal appendix. Stomach is within normal limits. Expected location of the percutaneous gastrostomy tube in the stomach. Vascular/Lymphatic: Aortic atherosclerosis. No enlarged abdominal or pelvic lymph nodes. Reproductive: No acute abnormality. Other: Smaller free fluid in the pelvis. No free intraperitoneal air. Musculoskeletal: Resorption of the inferior sacrum and coccyx compatible with osteomyelitis. There is a large sacral decubitus ulcer measuring 7.4 x 6.6 x 3.9 cm and abutting the inferior portion of the remaining sacrum. Adjacent fat stranding and subcutaneous and presacral edema. No abscess. No soft tissue gas. IMPRESSION: 1. Large sacral decubitus ulcer with resorption of the inferior sacrum and coccyx. No abscess. 2. Small left-greater-than-right pleural effusions and associated atelectasis. 3. Expected location of the percutaneous gastrostomy tube in the stomach. 4. Foley catheter in the nondistended bladder. Aortic Atherosclerosis (ICD10-I70.0). Electronically Signed   By: Minerva Fester M.D.   On: 09/17/2023 20:35    Pending Labs Unresulted Labs (From admission, onward)     Start     Ordered   09/18/23 0500  APTT  Tomorrow morning,   R        09/17/23 2337   09/18/23 0500  Protime-INR  Tomorrow morning,   R        09/17/23 2337   09/18/23 0500  Basic metabolic panel  Tomorrow morning,  R        09/17/23 2337   09/18/23 0500  CBC with Differential/Platelet  Tomorrow morning,   R        09/17/23 2352   09/18/23 0500  Sedimentation rate   Tomorrow morning,   R        09/17/23 2354   09/18/23 0002  Hemoglobin A1c  Once,   R       Comments: To assess prior glycemic control    09/18/23 0004   09/17/23 2336  HIV Antibody (routine testing w rflx)  (HIV Antibody (Routine testing w reflex) panel)  Once,   R        09/17/23 2337   09/17/23 1904  Blood culture (routine x 2)  BLOOD CULTURE X 2,   R (with STAT occurrences)      09/17/23 1903   09/17/23 1518  Urine Culture  Once,   URGENT       Question:  Indication  Answer:  Dysuria   09/17/23 1517            Vitals/Pain Today's Vitals   09/17/23 1800 09/17/23 1957 09/17/23 2100 09/17/23 2111  BP:    (!) 102/91  Pulse:    70  Resp:    20  Temp: (!) 100.7 F (38.2 C) 99.1 F (37.3 C) (!) 100.4 F (38 C)   TempSrc: Oral Oral Rectal   SpO2:    94%  PainSc:        Isolation Precautions No active isolations  Medications Medications  acetaminophen (TYLENOL) tablet 650 mg (has no administration in time range)    Or  acetaminophen (TYLENOL) suppository 650 mg (has no administration in time range)  polyethylene glycol (MIRALAX / GLYCOLAX) packet 17 g (has no administration in time range)  sodium chloride flush (NS) 0.9 % injection 3 mL (has no administration in time range)  dextrose 5% in lactated ringers with KCl 20 mEq/L infusion (has no administration in time range)  cefTRIAXone (ROCEPHIN) 2 g in sodium chloride 0.9 % 100 mL IVPB (has no administration in time range)  metoCLOPramide (REGLAN) 5 MG/5ML solution 5 mg (has no administration in time range)  amiodarone (PACERONE) tablet 100 mg (has no administration in time range)  albuterol (PROVENTIL) (2.5 MG/3ML) 0.083% nebulizer solution 2.5 mg (has no administration in time range)  levETIRAcetam (KEPPRA) tablet 1,500 mg (has no administration in time range)  PARoxetine (PAXIL) tablet 10 mg (has no administration in time range)  insulin glargine-yfgn (SEMGLEE) injection 5 Units (has no administration in time range)   insulin aspart (novoLOG) injection 0-5 Units (has no administration in time range)  insulin aspart (novoLOG) injection 0-6 Units (has no administration in time range)  ondansetron (ZOFRAN) tablet 4 mg (has no administration in time range)  atorvastatin (LIPITOR) tablet 40 mg (has no administration in time range)  hydrALAZINE (APRESOLINE) tablet 25 mg (has no administration in time range)  terazosin (HYTRIN) capsule 1 mg (has no administration in time range)  amLODipine (NORVASC) tablet 10 mg (has no administration in time range)  finasteride (PROSCAR) tablet 5 mg (has no administration in time range)  pantoprazole (PROTONIX) EC tablet 40 mg (has no administration in time range)  carvedilol (COREG) tablet 25 mg (has no administration in time range)  cloNIDine (CATAPRES) tablet 0.1 mg (has no administration in time range)  losartan (COZAAR) tablet 50 mg (has no administration in time range)  feeding supplement (BOOST / RESOURCE BREEZE) liquid 1 Container (has no administration in  time range)  influenza vac split trivalent PF (FLULAVAL) injection 0.5 mL (has no administration in time range)  potassium chloride SA (KLOR-CON M) CR tablet 40 mEq (40 mEq Oral Given 09/17/23 1549)  ondansetron (ZOFRAN) injection 4 mg (4 mg Intravenous Given 09/17/23 1548)  iohexol (OMNIPAQUE) 350 MG/ML injection 75 mL (75 mLs Intravenous Contrast Given 09/17/23 1710)  acetaminophen (TYLENOL) tablet 1,000 mg (1,000 mg Oral Given 09/17/23 1809)  cefTRIAXone (ROCEPHIN) 2 g in sodium chloride 0.9 % 100 mL IVPB (0 g Intravenous Stopped 09/17/23 2101)    Mobility non-ambulatory     Focused Assessments   R Recommendations: See Admitting Provider Note  Report given to:   Additional Notes: History of stroke with residual left-sided deficits.

## 2023-09-19 DIAGNOSIS — R1110 Vomiting, unspecified: Secondary | ICD-10-CM | POA: Diagnosis not present

## 2023-09-19 DIAGNOSIS — N390 Urinary tract infection, site not specified: Secondary | ICD-10-CM | POA: Diagnosis not present

## 2023-09-19 DIAGNOSIS — R509 Fever, unspecified: Secondary | ICD-10-CM | POA: Diagnosis not present

## 2023-09-19 DIAGNOSIS — D72829 Elevated white blood cell count, unspecified: Secondary | ICD-10-CM | POA: Diagnosis not present

## 2023-09-19 LAB — GLUCOSE, CAPILLARY
Glucose-Capillary: 88 mg/dL (ref 70–99)
Glucose-Capillary: 63 mg/dL — ABNORMAL LOW (ref 70–99)
Glucose-Capillary: 79 mg/dL (ref 70–99)
Glucose-Capillary: 110 mg/dL — ABNORMAL HIGH (ref 70–99)
Glucose-Capillary: 101 mg/dL — ABNORMAL HIGH (ref 70–99)
Glucose-Capillary: 236 mg/dL — ABNORMAL HIGH (ref 70–99)

## 2023-09-19 LAB — VITAMIN D 25 HYDROXY (VIT D DEFICIENCY, FRACTURES): Vit D, 25-Hydroxy: 28.32 ng/mL — ABNORMAL LOW (ref 30–100)

## 2023-09-19 LAB — COMPREHENSIVE METABOLIC PANEL
ALT: 21 U/L (ref 0–44)
AST: 18 U/L (ref 15–41)
Albumin: <1.5 g/dL — ABNORMAL LOW (ref 3.5–5.0)
Alkaline Phosphatase: 99 U/L (ref 38–126)
Anion gap: 7 (ref 5–15)
BUN: 5 mg/dL — ABNORMAL LOW (ref 6–20)
CO2: 27 mmol/L (ref 22–32)
Calcium: 7.1 mg/dL — ABNORMAL LOW (ref 8.9–10.3)
Chloride: 97 mmol/L — ABNORMAL LOW (ref 98–111)
Creatinine, Ser: 1.15 mg/dL (ref 0.61–1.24)
GFR, Estimated: >60 mL/min (ref >60)
Glucose, Bld: 98 mg/dL (ref 70–99)
Potassium: 3.2 mmol/L — ABNORMAL LOW (ref 3.5–5.1)
Sodium: 131 mmol/L — ABNORMAL LOW (ref 135–145)
Total Bilirubin: 0.5 mg/dL (ref <1.2)
Total Protein: 4.9 g/dL — ABNORMAL LOW (ref 6.5–8.1)

## 2023-09-19 LAB — CBC
HCT: 21.5 % — ABNORMAL LOW (ref 39.0–52.0)
Hemoglobin: 7.3 g/dL — ABNORMAL LOW (ref 13.0–17.0)
MCH: 31.9 pg (ref 26.0–34.0)
MCHC: 34 g/dL (ref 30.0–36.0)
MCV: 93.9 fL (ref 80.0–100.0)
Platelets: 236 10*3/uL (ref 150–400)
RBC: 2.29 MIL/uL — ABNORMAL LOW (ref 4.22–5.81)
RDW: 15.9 % — ABNORMAL HIGH (ref 11.5–15.5)
WBC: 23 10*3/uL — ABNORMAL HIGH (ref 4.0–10.5)
nRBC: 0 % (ref 0.0–0.2)

## 2023-09-19 LAB — HEPATITIS B SURFACE ANTIBODY, QUANTITATIVE: Hep B S AB Quant (Post): <3.5 m[IU]/mL — ABNORMAL LOW

## 2023-09-19 LAB — C-REACTIVE PROTEIN: CRP: 24.5 mg/dL — ABNORMAL HIGH (ref <1.0)

## 2023-09-19 LAB — MAGNESIUM: Magnesium: 1.5 mg/dL — ABNORMAL LOW (ref 1.7–2.4)

## 2023-09-19 MED ORDER — SODIUM CHLORIDE 0.9 % IV SOLN
1.0000 g | INTRAVENOUS | Status: AC
  Administered 2023-09-21 – 2023-09-25 (×3): 1 g via INTRAVENOUS
  Filled 2023-09-19 (×3): qty 1

## 2023-09-19 MED ORDER — MAGNESIUM GLUCONATE 500 MG PO TABS
500.0000 mg | ORAL_TABLET | Freq: Once | ORAL | Status: AC
  Administered 2023-09-19: 500 mg via ORAL
  Filled 2023-09-19: qty 1

## 2023-09-19 MED ORDER — SODIUM CHLORIDE 0.9 % IV SOLN
1.0000 g | Freq: Once | INTRAVENOUS | Status: AC
  Administered 2023-09-19: 1 g via INTRAVENOUS
  Filled 2023-09-19: qty 1

## 2023-09-19 MED ORDER — POTASSIUM CHLORIDE CRYS ER 20 MEQ PO TBCR
40.0000 meq | EXTENDED_RELEASE_TABLET | Freq: Once | ORAL | Status: AC
  Administered 2023-09-19: 40 meq via ORAL
  Filled 2023-09-19: qty 2

## 2023-09-19 MED ORDER — ZINC OXIDE 40 % EX OINT
TOPICAL_OINTMENT | Freq: Two times a day (BID) | CUTANEOUS | Status: DC
  Administered 2023-09-28: 1 via TOPICAL
  Filled 2023-09-19: qty 57

## 2023-09-19 MED ORDER — DOCUSATE SODIUM 100 MG PO CAPS
200.0000 mg | ORAL_CAPSULE | Freq: Two times a day (BID) | ORAL | Status: AC
  Administered 2023-09-19 – 2023-09-20 (×3): 200 mg via ORAL
  Filled 2023-09-19 (×3): qty 2

## 2023-09-19 MED ORDER — DAKINS (1/4 STRENGTH) 0.125 % EX SOLN
Freq: Every day | CUTANEOUS | Status: AC
  Filled 2023-09-19: qty 473

## 2023-09-19 MED ORDER — VITAMIN D (ERGOCALCIFEROL) 1.25 MG (50000 UNIT) PO CAPS
50000.0000 [IU] | ORAL_CAPSULE | ORAL | Status: DC
  Administered 2023-09-19 – 2023-10-03 (×3): 50000 [IU] via ORAL
  Filled 2023-09-19 (×3): qty 1

## 2023-09-19 MED ORDER — SORBITOL 70 % SOLN: 30.0000 mL | Freq: Every day | Status: DC | PRN

## 2023-09-19 NOTE — Progress Notes (Addendum)
PROGRESS NOTE  Bruce Little ZOX:[id number] DOB: [birth year] DOA: 09/17/2023 PCP: Westley Hummer, MD   LOS: 2 days   Brief Narrative / Interim history: This is a 52 year old male with DM, PAF on Eliquis in the past, CKD now on dialysis, left BKA who comes into the hospital with intermittent nausea and vomiting, fever, and clogged PEG tube.  He has had a prolonged hospitalization at [facility] in [place] in August, admitted there 04/30/2023 and discharged 07/01/2023.  Hospital course reviewed.  He was visiting [place] from [place], was in a hotel when he was found to have altered mental status, vomiting.  He was found to be hypertensive in the ER with a blood pressure of 226/100, and a CT of the head showed 9.2 x 5.5 cm right frontal temporal parenchymal bleed with edema, mass effect and 1.1 cm left midline shift.  He is status post craniectomy and hematoma evacuation and EVD placement.  Hospital course complicated by Staph epidermidis in the CSF 7/17, will repeat growth 7/22 and 7/24.  Eventually EVD was removed, and there were no plans to replace the bone flap and will need artificial plate eventually.  He will developed sacral decubitus ulcer and underwent serial debridements, sacral wounds grew E. coli, Morganella and Enterococcus faecalis and placed of antibiotics for several weeks.  Hospital course was also complicated by C. difficile diarrhea status post full course of vancomycin while being on IV antibiotics also, and in addition, had a PEG and a trach and developed renal failure requiring dialysis.  Following [place] [facility], [facility] and he was there August 22 through August 10, 2023.  Worth mentioning is that he was decannulated on 07/23/2023, and weaned off to room air, and also with ongoing SLP he was started on p.o. intake along with his PEG tube.  He has been in rehab through October, but apparently has been home for couple of  weeks prior to being here.  Subjective / 24h Interval events: Wakes up easily this morning, he has no complaints.  Denies any pain.  He denies any chest discomfort, nausea, vomiting, abdominal pain.  He denies any fever or chills.  There is no sore throat, runny nose, chest congestion.  Denies any diarrhea.  Assesement and Plan: Principal problem SIRS with fever, leukocytosis -patient was febrile to 100.7 on admission, and white count was 20K, and increased to 30K the next day.  There were initial concerns for UTI, he is chronic Foley was replaced in the ER and he was started on ceftriaxone.  Cultures were sent, continue to monitor.  Apparently the fever has been on and off for quite some time and at 1 point it is mentioned in the chart that someone thought they may be attributed to neurogenic fevers.  However, in the past 2 weeks, while at home, was not febrile up until the day prior to admission -White count improving today, blood cultures are negative but urine cultures are growing gram-negative rods.  Continue ceftriaxone.  ID following  Active problems History of PAF-currently in sinus rhythm.  He is on amiodarone, Coreg, continue.  No longer on anticoagulation following his ICH in August  Nausea, vomiting-on a daily basis, one of the main problems per wife.  This limits his ability for p.o. intake.  Have started scheduled Zofran, has had some nausea but there are no reports of further vomiting.  Continue to monitor  History of ICH-closely monitor mental status.  CT scan done  on admission does not show any evidence of recurrent bleed.  He has established with local neurosurgery, I do not have direct access to notes but there were plans in place from [facility] hospitalization to eventually have a plate fitted to his cranium  ESRD-on HD, however kidney numbers do not look too bad.  Nephrology consulted  Chronic urinary retention-with chronic Foley  Stage IV sacral decubitus ulcer, POA-CT scan this  admission showed resorption of the inferior sacrum and coccyx without abscess.  ID consulted as well.  Underwent debridement by general surgery in August, and no further debridements were recommended following his [facility] stay -Wound consult, will obtain surgical input tomorrow given some necrotic areas  Hyponatremia-in the setting of renal disease  Hypokalemia-replace as indicated.  Magnesium low as well, will give low doses  Vitamin D deficiency-start supplementation  Anemia-of chronic renal disease as well as chronic illness.  Watch, transfuse for hemoglobin less than 7.  Clogged PEG tube-will need to establish how much p.o. intake he is taking.  Will discuss with wife.  Consult RD as well.  IR consulted today to assist with a PEG tube, however patient expressed the wishes for [place] to remove it  Status post trach-now decannulated, on room air  History of seizures-continue Keppra  Essential hypertension-continue antihypertensives as below, blood pressure remaining stable today  Type 2 diabetes mellitus-A1c pretty low, CBGs stable, discontinue glargine and keep on sliding scale for now  Lab Results  Component Value Date   HGBA1C 5.1 09/18/2023   CBG (last 3)  Recent Labs    09/19/23 0150 09/19/23 0717 09/19/23 0744  GLUCAP 88 63* 79    Scheduled Meds:  amiodarone  100 mg Oral Daily   amLODipine  10 mg Oral Daily   atorvastatin  40 mg Oral QHS   carvedilol  25 mg Oral BID WC   Chlorhexidine Gluconate Cloth  6 each Topical Q0600   cloNIDine  0.1 mg Oral TID   darbepoetin (ARANESP) injection - DIALYSIS  60 mcg Subcutaneous Q Sat-1800   feeding supplement  1 Container Oral TID BM   finasteride  5 mg Oral Daily   hydrALAZINE  25 mg Oral Q8H   influenza vac split trivalent PF  0.5 mL Intramuscular Tomorrow-1000   insulin aspart  0-5 Units Subcutaneous QHS   insulin aspart  0-6 Units Subcutaneous TID WC   levETIRAcetam  1,500 mg Oral BID   liver oil-zinc oxide   Topical BID    losartan  50 mg Oral Daily   magnesium gluconate  500 mg Oral Once   multivitamin  1 tablet Oral QHS   ondansetron (ZOFRAN) IV  4 mg Intravenous Q12H   pantoprazole  40 mg Oral BID   PARoxetine  10 mg Oral Daily   sodium chloride flush  3 mL Intravenous Q12H   sodium hypochlorite   Irrigation Daily   terazosin  1 mg Oral QHS   Vitamin D (Ergocalciferol)  50,000 Units Oral Q7 days   Continuous Infusions:  cefTRIAXone (ROCEPHIN)  IV 2 g (09/18/23 2011)   PRN Meds:.acetaminophen **OR** acetaminophen, albuterol, food thickener, polyethylene glycol, prochlorperazine  Current Outpatient Medications  Medication Instructions   albuterol (VENTOLIN HFA) 108 (90 Base) MCG/ACT inhaler 2 puffs, Inhalation, Every 4 hours PRN   amiodarone (PACERONE) 100 mg, Oral, Daily   amLODipine (NORVASC) 10 mg, Oral, Daily   atorvastatin (LIPITOR) 80 mg, Oral, Every evening   atorvastatin (LIPITOR) 40 mg, Oral, Daily at bedtime   carvedilol (COREG)  37.5 mg, Oral, 2 times daily with meals   cloNIDine (CATAPRES) 0.3 mg, Oral, 3 times daily   finasteride (PROSCAR) 5 mg, Oral, Daily   furosemide (LASIX) 40 mg, Oral, Daily   glipiZIDE (GLUCOTROL) 5 mg, Oral, Daily   hydrALAZINE (APRESOLINE) 25 mg, Oral, Every 8 hours   Lantus SoloStar 5 Units, Subcutaneous, Every 12 hours   levETIRAcetam (KEPPRA) 1,500 mg, Oral, 2 times daily   losartan (COZAAR) 100 mg, Oral, Daily   Melatonin 10 MG TABS 1 tablet, Oral, Daily at bedtime   ondansetron (ZOFRAN) 4 mg, Oral, Every 8 hours PRN   pantoprazole (PROTONIX) 40 mg, Oral, Daily   PARoxetine (PAXIL) 10 mg, Oral, Daily   terazosin (HYTRIN) 1 mg, Oral, Daily at bedtime    Diet Orders (From admission, onward)     Start     Ordered   09/18/23 1350  Diet clear liquid Room service appropriate? Yes; Fluid consistency: Thin  Diet effective now       Question Answer Comment  Room service appropriate? Yes   Fluid consistency: Thin      09/18/23 1351            DVT  prophylaxis: SCDs Start: 09/17/23 2336   Lab Results  Component Value Date   PLT 236 09/19/2023      Code Status: Full Code  Family Communication: Discussed with wife [name]  Status is: Inpatient Remains inpatient appropriate because: severity of illness  Level of care: Telemetry Medical  Consultants:  Nephrology ID  Objective: Vitals:   09/19/23 0113 09/19/23 0151 09/19/23 0609 09/19/23 0849  BP: 122/71 120/83 (!) 130/97 127/85  Pulse: 81 81 81 82  Resp: 20 18 18    Temp: 99 F (37.2 C) 97.8 F (36.6 C) 97.7 F (36.5 C) 98 F (36.7 C)  TempSrc: Oral   Oral  SpO2: 98% 97% 96% 98%  Weight:      Height:        Intake/Output Summary (Last 24 hours) at 09/19/2023 1021 Last data filed at 09/19/2023 0850 Gross per 24 hour  Intake 1521.15 ml  Output 3875 ml  Net -2353.85 ml   Wt Readings from Last 3 Encounters:  09/18/23 93.6 kg  04/14/23 111.1 kg  10/29/21 86.1 kg    Examination:  Constitutional: NAD Eyes: lids and conjunctivae normal, no scleral icterus ENMT: mmm Neck: normal, supple Respiratory: clear to auscultation bilaterally, no wheezing, no crackles. Normal respiratory effort.  Cardiovascular: Regular rate and rhythm, no murmurs / rubs / gallops. No LE edema. Abdomen: soft, no distention, no tenderness. Bowel sounds positive.   Data Reviewed: I have independently reviewed following labs and imaging studies   CBC Recent Labs  Lab 09/17/23 1006 09/18/23 0058 09/19/23 0452  WBC 20.4* 30.2* 23.0*  HGB 7.9* 7.1* 7.3*  HCT 24.3* 21.9* 21.5*  PLT 276 246 236  MCV 94.6 95.2 93.9  MCH 30.7 30.9 31.9  MCHC 32.5 32.4 34.0  RDW 15.9* 16.0* 15.9*  LYMPHSABS  --  1.7  --   MONOABS  --  2.1*  --   EOSABS  --  0.0  --   BASOSABS  --  0.1  --     Recent Labs  Lab 09/17/23 1006 09/18/23 0058 09/19/23 0452  NA 132* 130* 131*  K 3.1* 3.1* 3.2*  CL 96* 94* 97*  CO2 27 23 27   GLUCOSE 142* 86 98  BUN 9 12 5*  CREATININE 1.69* 1.85* 1.15  CALCIUM  7.8* 7.4* 7.1*  AST 27  --  18  ALT 30  --  21  ALKPHOS 106  --  99  BILITOT 0.8  --  0.5  ALBUMIN 1.6*  --  <1.5*  MG  --   --  1.5*  CRP  --   --  24.5*  INR  --  1.1  --   HGBA1C  --  5.1  --     ------------------------------------------------------------------------------------------------------------------ No results for input(s): "CHOL", "HDL", "LDLCALC", "TRIG", "CHOLHDL", "LDLDIRECT" in the last 72 hours.  Lab Results  Component Value Date   HGBA1C 5.1 09/18/2023   ------------------------------------------------------------------------------------------------------------------ No results for input(s): "TSH", "T4TOTAL", "T3FREE", "THYROIDAB" in the last 72 hours.  Invalid input(s): "FREET3"  Cardiac Enzymes No results for input(s): "CKMB", "TROPONINI", "MYOGLOBIN" in the last 168 hours.  Invalid input(s): "CK" ------------------------------------------------------------------------------------------------------------------ No results found for: "BNP"  CBG: Recent Labs  Lab 09/18/23 1109 09/18/23 1622 09/19/23 0150 09/19/23 0717 09/19/23 0744  GLUCAP 103* 141* 88 63* 79    Recent Results (from the past 240 hour(s))  Urine Culture     Status: Abnormal (Preliminary result)   Collection Time: 09/17/23  3:18 PM   Specimen: Urine, Catheterized  Result Value Ref Range Status   Specimen Description URINE, CATHETERIZED  Final   Special Requests   Final    NONE Performed at [facility], [address], [place], [place] [address]    Culture 80,000 COLONIES/mL GRAM NEGATIVE RODS (A)  Final   Report Status PENDING  Incomplete  Blood culture (routine x 2)     Status: None (Preliminary result)   Collection Time: 09/17/23  8:15 PM   Specimen: BLOOD RIGHT HAND  Result Value Ref Range Status   Specimen Description BLOOD RIGHT HAND  Final   Special Requests   Final    BOTTLES DRAWN AEROBIC AND ANAEROBIC Blood Culture results may not be optimal due to an  inadequate volume of blood received in culture bottles   Culture   Final    NO GROWTH 2 DAYS Performed at [facility], [address] [place], [place] [address]    Report Status PENDING  Incomplete  Blood culture (routine x 2)     Status: None (Preliminary result)   Collection Time: 09/17/23  8:24 PM   Specimen: BLOOD RIGHT ARM  Result Value Ref Range Status   Specimen Description BLOOD RIGHT ARM  Final   Special Requests   Final    BOTTLES DRAWN AEROBIC AND ANAEROBIC Blood Culture adequate volume   Culture   Final    NO GROWTH 2 DAYS Performed at [facility], [address] [place], [place] [address]    Report Status PENDING  Incomplete     Radiology Studies: No results found.   Pamella Pert, MD, PhD [facility]  Between 7 am - 7 pm I am available, please contact me via Amion (for emergencies) or Securechat (non urgent messages)  Between 7 pm - 7 am I am not available, please contact night coverage MD/APP via Amion

## 2023-09-19 NOTE — Progress Notes (Signed)
Pharmacy Antibiotic Note  Bruce Little is a 52 y.o. male admitted on 09/17/2023 with UTI.  Pharmacy has been consulted for ceftazidime dosing.  ESRD pt who was admitted for fever/leukocytosis with possible UTI. Urine culture grew out pseudomonas with sens pending. ID change ceftriaxone to ceftazidime. HD TTS  Plan: Cefazidime 1g IV x1 then TTS post HD F/u sens  Height: 6\' 2"  (188 cm) Weight: 93.6 kg (206 lb 5.6 oz) IBW/kg (Calculated) : 82.2  Temp (24hrs), Avg:98.5 F (36.9 C), Min:97.7 F (36.5 C), Max:99.9 F (37.7 C)  Recent Labs  Lab 09/17/23 1006 09/18/23 0058 09/19/23 0452  WBC 20.4* 30.2* 23.0*  CREATININE 1.69* 1.85* 1.15    Estimated Creatinine Clearance: 88.4 mL/min (by C-G formula based on SCr of 1.15 mg/dL).    No Known Allergies  Antimicrobials this admission: 11/8 ceftriaxone>>11/9 11/10 ceftazidime>>  Dose adjustments this admission:   Microbiology results: 11/8 blood>>ngtd 11/8 urine>> 80k pseudomonas  Ulyses Southward, PharmD, BCIDP, AAHIVP, CPP Infectious Disease Pharmacist 09/19/2023 2:10 PM

## 2023-09-19 NOTE — Consult Note (Signed)
WOC Nurse Consult Note:this consult performed remotely after review of EMR including photo documentation; patient with Stage 4 PI sacrum that has had serial debridements w/recommendation for no further surgical debridements per MD note  Reason for Consult:sacral decubitus  Wound type: Stage 4 Pressure Injury  Pressure Injury POA: Yes Measurement: see nursing note  Wound bed:50% loose tan necrotic tissue 50% pink moist  Drainage (amount, consistency, odor) see nursing flowsheet  Periwound: erythema, peeling skin  Dressing procedure/placement/frequency: Clean sacral wound with NS, using a Q tip applicator apply  Dakin's moist gauze to wound bed making sure to fill in depth of wound daily x 3 days, cover with dry gauze and ABD pad or silicone foam.    After 3 days of Dakin's (beginning 09/22/2023) begin Vashe moistened gauze to wound bed daily.    Apply a thin layer of Desitin to skin surrounding wound and buttocks 2 times daily and prn soiling.   Patient should remain on a low air loss mattress throughout hospitalization.    POC discussed with bedside nurse and primary MD. WOC team will not follow. RE-consult if further needs arise.   Thanks,  [employer] MSN, RN-BC, [employer] [phone]  :

## 2023-09-19 NOTE — Progress Notes (Signed)
[facility] Progress Note   Subjective:   Patient seen and examined at bedside.  No family present.  Reports dialysis tolerated well except the amount of time he was there.  Admits to nausea this AM, no vomiting so far today.  Denies CP, SOB, abdominal pain and diarrhea.   Objective Vitals:   09/19/23 0113 09/19/23 0151 09/19/23 0609 09/19/23 0849  BP: 122/71 120/83 (!) 130/97 127/85  Pulse: 81 81 81 82  Resp: 20 18 18    Temp: 99 F (37.2 C) 97.8 F (36.6 C) 97.7 F (36.5 C) 98 F (36.7 C)  TempSrc: Oral   Oral  SpO2: 98% 97% 96% 98%  Weight:      Height:       Physical Exam General: chronically ill appearing male in NAD Heart:RRR, no mrg Lungs:CTAB, nml WOB on RA Abdomen:soft, NTND Extremities:L BKA, no LE edema Dialysis Access: [facility]   Filed Weights   09/18/23 0307  Weight: 93.6 kg    Intake/Output Summary (Last 24 hours) at 09/19/2023 0935 Last data filed at 09/19/2023 0850 Gross per 24 hour  Intake 1521.15 ml  Output 3875 ml  Net -2353.85 ml    Additional Objective Labs: Basic Metabolic Panel: Recent Labs  Lab 09/17/23 1006 09/18/23 0058 09/19/23 0452  NA 132* 130* 131*  K 3.1* 3.1* 3.2*  CL 96* 94* 97*  CO2 27 23 27   GLUCOSE 142* 86 98  BUN 9 12 5*  CREATININE 1.69* 1.85* 1.15  CALCIUM 7.8* 7.4* 7.1*   Liver Function Tests: Recent Labs  Lab 09/17/23 1006 09/19/23 0452  AST 27 18  ALT 30 21  ALKPHOS 106 99  BILITOT 0.8 0.5  PROT 5.4* 4.9*  ALBUMIN 1.6* <1.5*   Recent Labs  Lab 09/17/23 1006  LIPASE 43   CBC: Recent Labs  Lab 09/17/23 1006 09/18/23 0058 09/19/23 0452  WBC 20.4* 30.2* 23.0*  NEUTROABS  --  26.0*  --   HGB 7.9* 7.1* 7.3*  HCT 24.3* 21.9* 21.5*  MCV 94.6 95.2 93.9  PLT 276 246 236   Blood Culture    Component Value Date/Time   SDES BLOOD RIGHT ARM 09/17/2023 2024   SPECREQUEST  09/17/2023 2024    BOTTLES DRAWN AEROBIC AND ANAEROBIC Blood Culture adequate volume   CULT  09/17/2023 2024    NO  GROWTH 2 DAYS Performed at [facility], [address], [place], [place] [address]    REPTSTATUS PENDING 09/17/2023 2024   Lab Results  Component Value Date   INR 1.1 09/18/2023   INR 1.5 (H) 07/01/2021   INR 1.0 06/04/2021   Studies/Results: CT HEAD WO CONTRAST ( )  Result Date: 09/18/2023 CLINICAL DATA:  Stroke in June. Left-sided paralysis. Sent to ED by primary care. EXAM: CT HEAD WITHOUT CONTRAST TECHNIQUE: Contiguous axial images were obtained from the base of the skull through the vertex without intravenous contrast. RADIATION DOSE REDUCTION: This exam was performed according to the departmental dose-optimization program which includes automated exposure control, adjustment of the mA and/or kV according to patient size and/or use of iterative reconstruction technique. COMPARISON:  MRI head 05/19/2021. FINDINGS: Brain: No intracranial hemorrhage, mass effect, or evidence of acute infarct. Extensive encephalomalacia in the right cerebral hemisphere compatible with chronic infarct. Ex vacuo dilatation ventricles. Basal cisterns are patent. No extra-axial fluid collection. Age-commensurate cerebral atrophy and chronic small vessel ischemic disease. Vascular: No hyperdense vessel. Intracranial arterial calcification. Skull: No fracture or focal lesion.  Right craniectomy. Sinuses/Orbits: Opacification of  the few mastoid air cells bilaterally. Mucosal thickening left maxillary sinus. Other: None. IMPRESSION: 1. No acute intracranial abnormality. 2. Extensive chronic infarct in the right cerebral hemisphere. Electronically Signed   By: Minerva Fester M.D.   On: 09/18/2023 03:19   CT ABDOMEN PELVIS W CONTRAST  Result Date: 09/17/2023 CLINICAL DATA:  Nausea, vomiting, possible UTI.  GI tube clogged EXAM: CT ABDOMEN AND PELVIS WITH CONTRAST TECHNIQUE: Multidetector CT imaging of the abdomen and pelvis was performed using the standard protocol following bolus administration of intravenous  contrast. RADIATION DOSE REDUCTION: This exam was performed according to the departmental dose-optimization program which includes automated exposure control, adjustment of the mA and/or kV according to patient size and/or use of iterative reconstruction technique. CONTRAST:  75mL OMNIPAQUE IOHEXOL 350 MG/ML SOLN COMPARISON:  Renal ultrasound 08/04/2023 and radiographs 08/02/2023 FINDINGS: Lower chest: Small left-greater-than-right pleural effusions and associated atelectasis. Hepatobiliary: Mild distention of the gallbladder. Hyperdense layering sludge in the gallbladder. No biliary dilation. Liver is unremarkable. Pancreas: Unremarkable. Spleen: Unremarkable. Adrenals/Urinary Tract: Unremarkable adrenal glands. No urinary calculi or hydronephrosis. Foley catheter in the nondistended bladder. Stomach/Bowel: Normal caliber large and small bowel. Moderate colonic stool load. Normal appendix. Stomach is within normal limits. Expected location of the percutaneous gastrostomy tube in the stomach. Vascular/Lymphatic: Aortic atherosclerosis. No enlarged abdominal or pelvic lymph nodes. Reproductive: No acute abnormality. Other: Smaller free fluid in the pelvis. No free intraperitoneal air. Musculoskeletal: Resorption of the inferior sacrum and coccyx compatible with osteomyelitis. There is a large sacral decubitus ulcer measuring 7.4 x 6.6 x 3.9 cm and abutting the inferior portion of the remaining sacrum. Adjacent fat stranding and subcutaneous and presacral edema. No abscess. No soft tissue gas. IMPRESSION: 1. Large sacral decubitus ulcer with resorption of the inferior sacrum and coccyx. No abscess. 2. Small left-greater-than-right pleural effusions and associated atelectasis. 3. Expected location of the percutaneous gastrostomy tube in the stomach. 4. Foley catheter in the nondistended bladder. Aortic Atherosclerosis (ICD10-I70.0). Electronically Signed   By: Minerva Fester M.D.   On: 09/17/2023 20:35     Medications:  cefTRIAXone (ROCEPHIN)  IV 2 g (09/18/23 2011)    amiodarone  100 mg Oral Daily   amLODipine  10 mg Oral Daily   atorvastatin  40 mg Oral QHS   carvedilol  25 mg Oral BID WC   Chlorhexidine Gluconate Cloth  6 each Topical Q0600   cloNIDine  0.1 mg Oral TID   darbepoetin (ARANESP) injection - DIALYSIS  60 mcg Subcutaneous Q Sat-1800   feeding supplement  1 Container Oral TID BM   finasteride  5 mg Oral Daily   hydrALAZINE  25 mg Oral Q8H   influenza vac split trivalent PF  0.5 mL Intramuscular Tomorrow-1000   insulin aspart  0-5 Units Subcutaneous QHS   insulin aspart  0-6 Units Subcutaneous TID WC   insulin glargine-yfgn  5 Units Subcutaneous BID   levETIRAcetam  1,500 mg Oral BID   liver oil-zinc oxide   Topical BID   losartan  50 mg Oral Daily   multivitamin  1 tablet Oral QHS   ondansetron (ZOFRAN) IV  4 mg Intravenous Q12H   pantoprazole  40 mg Oral BID   PARoxetine  10 mg Oral Daily   sodium chloride flush  3 mL Intravenous Q12H   sodium hypochlorite   Irrigation Daily   terazosin  1 mg Oral QHS    Dialysis Orders: [facility]  4hrs, BFR 500, DFR AF 1.5,  EDW 98.6kg, 3K/  2.5Ca   Access: TDC  Heparin none Mircera 50 mcg q2wks - not yet given Hectorol IV qHD     Assessment/Plan:  Vomiting - Daily for months, G tube malfunction. Per PMD  ESRD -  On HD TTS. Started during prolonged hospitalization but previously followed for CKD stage 5 planning for HD.  Suspect muscle wasting causing falsely low SCr.  Can check 24hr urine to confirm.  Next HD 09/21/23. Leukocytosis - WBC  improving today.  ID consulted.  UTI suspected, ABX started.  Blood cultures obtained.  Has sacral decub and TDC as possible infection sources.  Fevers - suspected to be neurogenic in past work up.  If ongoing ID recommends repeat MRI brain.  Peg tube malfunction - IR  consulted. Per PMD  Hypertension/volume  - BP in goal. On multiple meds, PMD titrating. Does not appear grossly  overloaded.  Pleural effusions noted on CT. Getting under dry weight, likely needs to be lowered.  UF as tolerated.   Anemia of CKD - Hgb 7.3. Aranesp qwk - given yesterday.   Secondary Hyperparathyroidism -  CCa ok. Check phos. Continue home meds.   Nutrition - Regular diet, follow labs. Alb<1.5. Add protein supplements. Hx hemorrhagic stroke - on evidence recurrent bleed on CT.  Hypokalemia - K 3.2. Supplements already given. Using higher K bath with HD.   Virgina Norfolk, PA-C [facility] 09/19/2023,9:35 AM  LOS: 2 days

## 2023-09-19 NOTE — Progress Notes (Signed)
[facility]    Date of Admission:  09/17/2023   Total days of antibiotics 2   ID: Bruce Little is a 52 y.o. male with  fever/leukocytosis possible CAUTI Principal Problem:   Vomiting Active Problems:   Renal insufficiency   HTN (hypertension)   UTI (urinary tract infection)   Leukocytosis   Sacral decubitus ulcer   ICH (intracerebral hemorrhage) (HCC)    Subjective: Afebrile today, feeling better. Only vomited once during hd last night. Eating okay. No diarrhea nor abdominal pain   Micro: BCx NGTD in 48hr Ur cx showing 80,000 PsA  Medications:   amiodarone  100 mg Oral Daily   amLODipine  10 mg Oral Daily   atorvastatin  40 mg Oral QHS   carvedilol  25 mg Oral BID WC   Chlorhexidine Gluconate Cloth  6 each Topical Q0600   cloNIDine  0.1 mg Oral TID   darbepoetin (ARANESP) injection - DIALYSIS  60 mcg Subcutaneous Q Sat-1800   docusate sodium  200 mg Oral BID   feeding supplement  1 Container Oral TID BM   finasteride  5 mg Oral Daily   hydrALAZINE  25 mg Oral Q8H   influenza vac split trivalent PF  0.5 mL Intramuscular Tomorrow-1000   insulin aspart  0-5 Units Subcutaneous QHS   insulin aspart  0-6 Units Subcutaneous TID WC   levETIRAcetam  1,500 mg Oral BID   liver oil-zinc oxide   Topical BID   losartan  50 mg Oral Daily   multivitamin  1 tablet Oral QHS   ondansetron (ZOFRAN) IV  4 mg Intravenous Q12H   pantoprazole  40 mg Oral BID   PARoxetine  10 mg Oral Daily   sodium chloride flush  3 mL Intravenous Q12H   sodium hypochlorite   Irrigation Daily   terazosin  1 mg Oral QHS   Vitamin D (Ergocalciferol)  50,000 Units Oral Q7 days    Objective: Vital signs in last 24 hours: Temp:  [97.7 F (36.5 C)-99.9 F (37.7 C)] 98 F (36.7 C) (11/10 0849) Pulse Rate:  [71-82] 82 (11/10 0849) Resp:  [6-23] 18 (11/10 0609) BP: (109-132)/(52-97) 127/85 (11/10 0849) SpO2:  [93 %-99 %] 98 % (11/10 0849) Physical Exam  Constitutional: He is  oriented to person, place, and time. He appears well-developed and well-nourished. No distress.  HENT:  Mouth/Throat: Oropharynx is clear and moist. No oropharyngeal exudate.  Cardiovascular: Normal rate, regular rhythm and normal heart sounds. Exam reveals no gallop and no friction rub.  No murmur heard.  Pulmonary/Chest: Effort normal and breath sounds normal. No respiratory distress. He has no wheezes.  Abdominal: Soft. Bowel sounds are normal. He exhibits no distension. There is no tenderness.  Lymphadenopathy:  He has no cervical adenopathy.  Neurological: He is alert and oriented to person, place, and time.  Skin: Skin is warm and dry. No rash noted. No erythema.  Psychiatric: He has a normal mood and affect. His behavior is normal.    Lab Results Recent Labs    09/18/23 0058 09/19/23 0452  WBC 30.2* 23.0*  HGB 7.1* 7.3*  HCT 21.9* 21.5*  NA 130* 131*  K 3.1* 3.2*  CL 94* 97*  CO2 23 27  BUN 12 5*  CREATININE 1.85* 1.15   Liver Panel Recent Labs    09/17/23 1006 09/19/23 0452  PROT 5.4* 4.9*  ALBUMIN 1.6* <1.5*  AST 27 18  ALT 30 21  ALKPHOS 106 99  BILITOT 0.8 0.5   Sedimentation Rate Recent Labs    09/18/23 0058  ESRSEDRATE 54*   C-Reactive Protein Recent Labs    09/19/23 0452  CRP 24.5*    Microbiology: Reviewed. Urcx PsA Studies/Results: CT HEAD WO CONTRAST ( )  Result Date: 09/18/2023 CLINICAL DATA:  Stroke in June. Left-sided paralysis. Sent to ED by primary care. EXAM: CT HEAD WITHOUT CONTRAST TECHNIQUE: Contiguous axial images were obtained from the base of the skull through the vertex without intravenous contrast. RADIATION DOSE REDUCTION: This exam was performed according to the departmental dose-optimization program which includes automated exposure control, adjustment of the mA and/or kV according to patient size and/or use of iterative reconstruction technique. COMPARISON:  MRI head 05/19/2021. FINDINGS: Brain: No intracranial  hemorrhage, mass effect, or evidence of acute infarct. Extensive encephalomalacia in the right cerebral hemisphere compatible with chronic infarct. Ex vacuo dilatation ventricles. Basal cisterns are patent. No extra-axial fluid collection. Age-commensurate cerebral atrophy and chronic small vessel ischemic disease. Vascular: No hyperdense vessel. Intracranial arterial calcification. Skull: No fracture or focal lesion.  Right craniectomy. Sinuses/Orbits: Opacification of the few mastoid air cells bilaterally. Mucosal thickening left maxillary sinus. Other: None. IMPRESSION: 1. No acute intracranial abnormality. 2. Extensive chronic infarct in the right cerebral hemisphere. Electronically Signed   By: Minerva Fester M.D.   On: 09/18/2023 03:19   CT ABDOMEN PELVIS W CONTRAST  Result Date: 09/17/2023 CLINICAL DATA:  Nausea, vomiting, possible UTI.  GI tube clogged EXAM: CT ABDOMEN AND PELVIS WITH CONTRAST TECHNIQUE: Multidetector CT imaging of the abdomen and pelvis was performed using the standard protocol following bolus administration of intravenous contrast. RADIATION DOSE REDUCTION: This exam was performed according to the departmental dose-optimization program which includes automated exposure control, adjustment of the mA and/or kV according to patient size and/or use of iterative reconstruction technique. CONTRAST:  75mL OMNIPAQUE IOHEXOL 350 MG/ML SOLN COMPARISON:  Renal ultrasound 08/04/2023 and radiographs 08/02/2023 FINDINGS: Lower chest: Small left-greater-than-right pleural effusions and associated atelectasis. Hepatobiliary: Mild distention of the gallbladder. Hyperdense layering sludge in the gallbladder. No biliary dilation. Liver is unremarkable. Pancreas: Unremarkable. Spleen: Unremarkable. Adrenals/Urinary Tract: Unremarkable adrenal glands. No urinary calculi or hydronephrosis. Foley catheter in the nondistended bladder. Stomach/Bowel: Normal caliber large and small bowel. Moderate colonic  stool load. Normal appendix. Stomach is within normal limits. Expected location of the percutaneous gastrostomy tube in the stomach. Vascular/Lymphatic: Aortic atherosclerosis. No enlarged abdominal or pelvic lymph nodes. Reproductive: No acute abnormality. Other: Smaller free fluid in the pelvis. No free intraperitoneal air. Musculoskeletal: Resorption of the inferior sacrum and coccyx compatible with osteomyelitis. There is a large sacral decubitus ulcer measuring 7.4 x 6.6 x 3.9 cm and abutting the inferior portion of the remaining sacrum. Adjacent fat stranding and subcutaneous and presacral edema. No abscess. No soft tissue gas. IMPRESSION: 1. Large sacral decubitus ulcer with resorption of the inferior sacrum and coccyx. No abscess. 2. Small left-greater-than-right pleural effusions and associated atelectasis. 3. Expected location of the percutaneous gastrostomy tube in the stomach. 4. Foley catheter in the nondistended bladder. Aortic Atherosclerosis (ICD10-I70.0). Electronically Signed   By: Minerva Fester M.D.   On: 09/17/2023 20:35     Assessment/Plan: Pseudomonal Catheter Associated UTI = will switch from ceftriaxone to ceftaz. Can give with HD, as well.  Sacral wound = may benefit from bedside debridement to debulk slough  Leukocytosis improving  Hx of craniotomy and needing bone flap back from OSH = possibly call pathology dept to see if they can arrange for transfer  and storage  Severe protein calorie malnutrition = continue with ensure and consider nutritionist eval  Vomiting = appears improving but unclear if gastric dysmotility issue.  I have personally spent 50 minutes involved in face-to-face and non-face-to-face activities for this patient on the day of the visit. Professional time spent includes the following activities: Preparing to see the patient (review of tests), Obtaining and/or reviewing separately obtained history (admission/discharge record), Performing a medically  appropriate examination and/or evaluation , Ordering medications/tests/procedures, referring and communicating with other health care professionals, Documenting clinical information in the EMR, Independently interpreting results (not separately reported), Communicating results to the patient/family/caregiver, Counseling and educating the patient/family/caregiver and Care coordination (not separately reported).     [facility] Pager: [phone]  09/19/2023, 1:44 PM

## 2023-09-20 ENCOUNTER — Inpatient Hospital Stay (HOSPITAL_COMMUNITY): Payer: BC Managed Care – PPO

## 2023-09-20 DIAGNOSIS — D72829 Elevated white blood cell count, unspecified: Secondary | ICD-10-CM | POA: Diagnosis not present

## 2023-09-20 DIAGNOSIS — R1110 Vomiting, unspecified: Secondary | ICD-10-CM | POA: Diagnosis not present

## 2023-09-20 DIAGNOSIS — L89159 Pressure ulcer of sacral region, unspecified stage: Secondary | ICD-10-CM

## 2023-09-20 DIAGNOSIS — N390 Urinary tract infection, site not specified: Secondary | ICD-10-CM | POA: Diagnosis not present

## 2023-09-20 HISTORY — PX: IR REPLACE G-TUBE SIMPLE WO FLUORO: IMG2323

## 2023-09-20 LAB — CBC
HCT: 20.3 % — ABNORMAL LOW (ref 39.0–52.0)
Hemoglobin: 6.7 g/dL — CL (ref 13.0–17.0)
MCH: 31.3 pg (ref 26.0–34.0)
MCHC: 33 g/dL (ref 30.0–36.0)
MCV: 94.9 fL (ref 80.0–100.0)
Platelets: 230 10*3/uL (ref 150–400)
RBC: 2.14 MIL/uL — ABNORMAL LOW (ref 4.22–5.81)
RDW: 15.9 % — ABNORMAL HIGH (ref 11.5–15.5)
WBC: 16.5 10*3/uL — ABNORMAL HIGH (ref 4.0–10.5)
nRBC: 0 % (ref 0.0–0.2)

## 2023-09-20 LAB — GLUCOSE, CAPILLARY
Glucose-Capillary: 117 mg/dL — ABNORMAL HIGH (ref 70–99)
Glucose-Capillary: 168 mg/dL — ABNORMAL HIGH (ref 70–99)
Glucose-Capillary: 220 mg/dL — ABNORMAL HIGH (ref 70–99)
Glucose-Capillary: 198 mg/dL — ABNORMAL HIGH (ref 70–99)

## 2023-09-20 LAB — HEMOGLOBIN AND HEMATOCRIT, BLOOD
HCT: 22.6 % — ABNORMAL LOW (ref 39.0–52.0)
Hemoglobin: 7.5 g/dL — ABNORMAL LOW (ref 13.0–17.0)

## 2023-09-20 LAB — URINE CULTURE: Culture: 80000 — AB

## 2023-09-20 LAB — PREPARE RBC (CROSSMATCH)

## 2023-09-20 MED ORDER — SODIUM CHLORIDE 0.9% IV SOLUTION: Freq: Once | INTRAVENOUS | Status: DC

## 2023-09-20 MED ORDER — DIATRIZOATE MEGLUMINE & SODIUM 66-10 % PO SOLN
ORAL | Status: AC
  Filled 2023-09-20: qty 30

## 2023-09-20 MED ORDER — CHLORHEXIDINE GLUCONATE CLOTH 2 % EX PADS
6.0000 | MEDICATED_PAD | Freq: Every day | CUTANEOUS | Status: DC
  Administered 2023-09-20 – 2023-09-25 (×6): 6 via TOPICAL

## 2023-09-20 MED ORDER — LIDOCAINE VISCOUS HCL 2 % MT SOLN
OROMUCOSAL | Status: AC
  Filled 2023-09-20: qty 15

## 2023-09-20 MED ORDER — DIATRIZOATE MEGLUMINE & SODIUM 66-10 % PO SOLN
30.0000 mL | Freq: Once | ORAL | Status: AC
  Administered 2023-09-20: 30 mL

## 2023-09-20 NOTE — Progress Notes (Signed)
[facility] Progress Note   Subjective:   Pt seen in room, sleeping but awakens to voice. Reports he is cold, otherwise no concerns. Denies SOB, CP, dizziness, nausea.   Objective Vitals:   09/19/23 1639 09/19/23 2000 09/20/23 0507 09/20/23 0747  BP: 128/67 122/65 133/75 138/70  Pulse: 74 78 74 75  Resp: 18   17  Temp: 98.5 F (36.9 C) 98.9 F (37.2 C) 98.3 F (36.8 C) 97.8 F (36.6 C)  TempSrc: Oral Oral Oral Oral  SpO2: 98% 97% 97% 96%  Weight:      Height:       Physical Exam General: Chronically ill appearing male, alert and in NAD Heart: RRR, no murmurs, rubs or gallops Lungs: CTA bilaterally Abdomen: Soft, non-distended ,+BS Extremities: No edema b/l lower extremities Dialysis Access: [facility] with intact bandage  Additional Objective Labs: Basic Metabolic Panel: Recent Labs  Lab 09/17/23 1006 09/18/23 0058 09/19/23 0452  NA 132* 130* 131*  K 3.1* 3.1* 3.2*  CL 96* 94* 97*  CO2 27 23 27   GLUCOSE 142* 86 98  BUN 9 12 5*  CREATININE 1.69* 1.85* 1.15  CALCIUM 7.8* 7.4* 7.1*   Liver Function Tests: Recent Labs  Lab 09/17/23 1006 09/19/23 0452  AST 27 18  ALT 30 21  ALKPHOS 106 99  BILITOT 0.8 0.5  PROT 5.4* 4.9*  ALBUMIN 1.6* <1.5*   Recent Labs  Lab 09/17/23 1006  LIPASE 43   CBC: Recent Labs  Lab 09/17/23 1006 09/18/23 0058 09/19/23 0452  WBC 20.4* 30.2* 23.0*  NEUTROABS  --  26.0*  --   HGB 7.9* 7.1* 7.3*  HCT 24.3* 21.9* 21.5*  MCV 94.6 95.2 93.9  PLT 276 246 236   Blood Culture    Component Value Date/Time   SDES BLOOD RIGHT ARM 09/17/2023 2024   SPECREQUEST  09/17/2023 2024    BOTTLES DRAWN AEROBIC AND ANAEROBIC Blood Culture adequate volume   CULT  09/17/2023 2024    NO GROWTH 3 DAYS Performed at [facility], [address] [place], [place] [address]    REPTSTATUS PENDING 09/17/2023 2024    Cardiac Enzymes: No results for input(s): "CKTOTAL", "CKMB", "CKMBINDEX", "TROPONINI" in the last 168  hours. CBG: Recent Labs  Lab 09/19/23 0744 09/19/23 1121 09/19/23 1638 09/19/23 2000 09/20/23 0744  GLUCAP 79 110* 101* 236* 117*   Iron Studies: No results for input(s): "IRON", "TIBC", "TRANSFERRIN", "FERRITIN" in the last 72 hours. @lablastinr3 @ Studies/Results: No results found. Medications:  [START ON 09/21/2023] cefTAZidime (FORTAZ)  IV      amiodarone  100 mg Oral Daily   amLODipine  10 mg Oral Daily   atorvastatin  40 mg Oral QHS   carvedilol  25 mg Oral BID WC   Chlorhexidine Gluconate Cloth  6 each Topical Q0600   cloNIDine  0.1 mg Oral TID   darbepoetin (ARANESP) injection - DIALYSIS  60 mcg Subcutaneous Q Sat-1800   docusate sodium  200 mg Oral BID   feeding supplement  1 Container Oral TID BM   finasteride  5 mg Oral Daily   hydrALAZINE  25 mg Oral Q8H   influenza vac split trivalent PF  0.5 mL Intramuscular Tomorrow-1000   insulin aspart  0-5 Units Subcutaneous QHS   insulin aspart  0-6 Units Subcutaneous TID WC   levETIRAcetam  1,500 mg Oral BID   liver oil-zinc oxide   Topical BID   losartan  50 mg Oral Daily   multivitamin  1  tablet Oral QHS   ondansetron (ZOFRAN) IV  4 mg Intravenous Q12H   pantoprazole  40 mg Oral BID   PARoxetine  10 mg Oral Daily   sodium chloride flush  3 mL Intravenous Q12H   sodium hypochlorite   Irrigation Daily   terazosin  1 mg Oral QHS   Vitamin D (Ergocalciferol)  50,000 Units Oral Q7 days    Dialysis Orders: [facility]  4hrs, BFR 500, DFR AF 1.5,  EDW 98.6kg, 3K/ 2.5Ca   Access: TDC  Heparin none Mircera 50 mcg q2wks - not yet given Hectorol IV qHD      Assessment/Plan:  Vomiting - Daily for months, G tube malfunction. Per PMD  ESRD -  On HD TTS. Started during prolonged hospitalization but previously followed for CKD stage 5 planning for HD.  Suspect muscle wasting causing falsely low SCr.  Can check 24hr urine to confirm but appears to have low UOP per charting.  Next HD 09/21/23. Leukocytosis - WBC   improving today.  ID consulted.  UTI suspected, ABX started.  Blood cultures obtained.  Has sacral decub and TDC as possible infection sources.  Fevers - suspected to be neurogenic in past work up.  Concern for UTI as above.  If ongoing ID recommends repeat MRI brain.  Peg tube malfunction - IR  consulted. Per PMD  Hypertension/volume  - BP in goal. On multiple meds, PMD titrating. Does not appear grossly overloaded.  Pleural effusions noted on CT. Getting under dry weight, likely needs to be lowered.  UF as tolerated.   Anemia of CKD - Last Hgb 7.3, labs pending this AM. Aranesp qwk - given yesterday.   Secondary Hyperparathyroidism -  CCa ok. Check phos. Continue home meds.   Nutrition - Regular diet, follow labs. Alb<1.5. Add protein supplements. Hx hemorrhagic stroke - on evidence recurrent bleed on CT.  Hypokalemia - K 3.2. Supplements already given. Using higher K bath with HD.     Rogers Blocker, PA-C 09/20/2023, 9:34 AM  [facility] Pager: [phone]

## 2023-09-20 NOTE — Procedures (Signed)
Successful removal of existing 20 Fr pull through gastrostomy and placement of new Entuit 20 Fr balloon retention gastrostomy tube within existing tract. Balloon instilled with 20 cc normal saline, pulled against inner gastric lumen and disc cinched to skin.  Both lumens flush easily, small amount of blood within tubing likely due to removal of pull through g-tube just before placement of balloon retention g tube.  Plan: - KUB ordered to confirm position - May use for feeds, meds, free water, etc once KUB confirms appropriate position.  Please call IR with questions or concerns.  Lynnette Caffey, PA-C

## 2023-09-20 NOTE — Progress Notes (Signed)
PROGRESS NOTE  Bruce Little ZOX:[id number] DOB: 09/22/1971 DOA: 09/17/2023 PCP: Westley Hummer, MD   LOS: 3 days   Brief Narrative / Interim history: This is a 52 year old male with DM, PAF on Eliquis in the past, CKD now on dialysis, left BKA who comes into the hospital with intermittent nausea and vomiting, fever, and clogged PEG tube.  He has had a prolonged hospitalization at [facility] in [place] in August, admitted there 04/30/2023 and discharged 07/01/2023.  Hospital course reviewed.  He was visiting [place] from [place], was in a hotel when he was found to have altered mental status, vomiting.  He was found to be hypertensive in the ER with a blood pressure of 226/100, and a CT of the head showed 9.2 x 5.5 cm right frontal temporal parenchymal bleed with edema, mass effect and 1.1 cm left midline shift.  He is status post craniectomy and hematoma evacuation and EVD placement.  Hospital course complicated by Staph epidermidis in the CSF 7/17, will repeat growth 7/22 and 7/24.  Eventually EVD was removed, and there were no plans to replace the bone flap and will need artificial plate eventually.  He will developed sacral decubitus ulcer and underwent serial debridements, sacral wounds grew E. coli, Morganella and Enterococcus faecalis and placed of antibiotics for several weeks.  Hospital course was also complicated by C. difficile diarrhea status post full course of vancomycin while being on IV antibiotics also, and in addition, had a PEG and a trach and developed renal failure requiring dialysis.  Following [place] hospitalization, [facility] and [facility] was [facility] August 22 through August 10, 2023.  Worth mentioning is that he was decannulated on 07/23/2023, and weaned off to room air, and also with ongoing SLP he was started on p.o. intake along with his PEG tube.  He has been in rehab through October, but apparently has been home for couple of  weeks prior to being here.  Subjective / 24h Interval events: No specific complaints this morning, no pain  Assesement and Plan: Principal problem SIRS with fever, leukocytosis -patient was febrile to 100.7 on admission, and white count was 20K, and increased to 30K the next day.  There were initial concerns for UTI, he is chronic Foley was replaced in the ER and he was started on ceftriaxone.  Cultures were sent, continue to monitor.  Apparently the fever has been on and off for quite some time and at 1 point it is mentioned in the chart that someone thought they may be attributed to neurogenic fevers.  However, in the past 2 weeks, while at home, was not febrile up until the day prior to admission -White count continues to improve.  Antibiotics per ID  Active problems History of PAF-currently in sinus rhythm.  He is on amiodarone, Coreg, continue.  No longer on anticoagulation following his ICH in August  Nausea, vomiting-on a daily basis, one of the main problems per wife.  This limits his ability for p.o. intake.  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  CT scan done on admission does not show any evidence of recurrent bleed.  He has established with local neurosurgery, I do not have direct access to notes but there were plans in place from [facility] hospitalization to eventually have a plate fitted to his cranium  ESRD-on HD, however kidney numbers do not look too bad.  Nephrology consulted  Chronic urinary retention-with chronic  Foley  Stage IV sacral decubitus ulcer, POA-CT scan this admission showed resorption of the inferior sacrum and coccyx without abscess.  ID consulted as well.  Underwent debridement by general surgery in August, and no further debridements were recommended following his [facility] stay -Wound consult, surgery can update today  Hyponatremia-in the setting of renal disease  Hypokalemia-replace as indicated.   Magnesium low as well, will give low doses  Vitamin D deficiency-start supplementation  Anemia-of chronic renal disease as well as chronic illness.  Transfuse unit of packed red blood cells for hemoglobin of 6.7.  He has no bleeding  Clogged PEG tube-will need to establish how much p.o. intake he is taking.  Will discuss with wife.  Consult RD as well.  IR plans to replace PEG tube today  Status post trach-now decannulated, on room air  History of seizures-continue Keppra  Essential hypertension-continue antihypertensives as below, blood pressure stable this morning  Type 2 diabetes mellitus-A1c pretty low, glargine has been discontinued due to stable CBGs  Lab Results  Component Value Date   HGBA1C 5.1 09/18/2023   CBG (last 3)  Recent Labs    09/19/23 1638 09/19/23 2000 09/20/23 0744  GLUCAP 101* 236* 117*    Scheduled Meds:  sodium chloride   Intravenous Once   amiodarone  100 mg Oral Daily   amLODipine  10 mg Oral Daily   atorvastatin  40 mg Oral QHS   carvedilol  25 mg Oral BID WC   Chlorhexidine Gluconate Cloth  6 each Topical Q0600   cloNIDine  0.1 mg Oral TID   darbepoetin (ARANESP) injection - DIALYSIS  60 mcg Subcutaneous Q Sat-1800   feeding supplement  1 Container Oral TID BM   finasteride  5 mg Oral Daily   hydrALAZINE  25 mg Oral Q8H   influenza vac split trivalent PF  0.5 mL Intramuscular Tomorrow-1000   insulin aspart  0-5 Units Subcutaneous QHS   insulin aspart  0-6 Units Subcutaneous TID WC   levETIRAcetam  1,500 mg Oral BID   liver oil-zinc oxide   Topical BID   losartan  50 mg Oral Daily   multivitamin  1 tablet Oral QHS   ondansetron (ZOFRAN) IV  4 mg Intravenous Q12H   pantoprazole  40 mg Oral BID   PARoxetine  10 mg Oral Daily   sodium chloride flush  3 mL Intravenous Q12H   sodium hypochlorite   Irrigation Daily   terazosin  1 mg Oral QHS   Vitamin D (Ergocalciferol)  50,000 Units Oral Q7 days   Continuous Infusions:  [START ON  09/21/2023] cefTAZidime (FORTAZ)  IV     PRN Meds:.acetaminophen **OR** acetaminophen, albuterol, food thickener, polyethylene glycol, prochlorperazine, sorbitol  Current Outpatient Medications  Medication Instructions   albuterol (VENTOLIN HFA) 108 (90 Base) MCG/ACT inhaler 2 puffs, Inhalation, Every 4 hours PRN   amiodarone (PACERONE) 100 mg, Oral, Daily   amLODipine (NORVASC) 10 mg, Oral, Daily   atorvastatin (LIPITOR) 80 mg, Oral, Every evening   atorvastatin (LIPITOR) 40 mg, Oral, Daily at bedtime   carvedilol (COREG) 37.5 mg, Oral, 2 times daily with meals   cloNIDine (CATAPRES) 0.3 mg, Oral, 3 times daily   finasteride (PROSCAR) 5 mg, Oral, Daily   furosemide (LASIX) 40 mg, Oral, Daily   glipiZIDE (GLUCOTROL) 5 mg, Oral, Daily   hydrALAZINE (APRESOLINE) 25 mg, Oral, Every 8 hours   Lantus SoloStar 5 Units, Subcutaneous, Every 12 hours   levETIRAcetam (KEPPRA) 1,500 mg, Oral, 2 times daily  losartan (COZAAR) 100 mg, Oral, Daily   Melatonin 10 MG TABS 1 tablet, Oral, Daily at bedtime   ondansetron (ZOFRAN) 4 mg, Oral, Every 8 hours PRN   pantoprazole (PROTONIX) 40 mg, Oral, Daily   PARoxetine (PAXIL) 10 mg, Oral, Daily   terazosin (HYTRIN) 1 mg, Oral, Daily at bedtime    Diet Orders (From admission, onward)     Start     Ordered   09/18/23 1350  Diet clear liquid Room service appropriate? Yes; Fluid consistency: Thin  Diet effective now       Question Answer Comment  Room service appropriate? Yes   Fluid consistency: Thin      09/18/23 1351            DVT prophylaxis: SCDs Start: 09/17/23 2336   Lab Results  Component Value Date   PLT 230 09/20/2023      Code Status: Full Code  Family Communication: Discussed with wife [name]  Status is: Inpatient Remains inpatient appropriate because: severity of illness  Level of care: Telemetry Medical  Consultants:  Nephrology ID  Objective: Vitals:   09/19/23 1639 09/19/23 2000 09/20/23 0507 09/20/23 0747   BP: 128/67 122/65 133/75 138/70  Pulse: 74 78 74 75  Resp: 18   17  Temp: 98.5 F (36.9 C) 98.9 F (37.2 C) 98.3 F (36.8 C) 97.8 F (36.6 C)  TempSrc: Oral Oral Oral Oral  SpO2: 98% 97% 97% 96%  Weight:      Height:        Intake/Output Summary (Last 24 hours) at 09/20/2023 1109 Last data filed at 09/20/2023 0300 Gross per 24 hour  Intake 120 ml  Output 0 ml  Net 120 ml   Wt Readings from Last 3 Encounters:  09/18/23 93.6 kg  04/14/23 111.1 kg  10/29/21 86.1 kg    Examination:  Constitutional: NAD Eyes: lids and conjunctivae normal, no scleral icterus ENMT: mmm Neck: normal, supple Respiratory: clear to auscultation bilaterally, no wheezing, no crackles. Normal respiratory effort.  Cardiovascular: Regular rate and rhythm, no murmurs / rubs / gallops. No LE edema. Abdomen: soft, no distention, no tenderness. Bowel sounds positive.  Skin: no rashes  Data Reviewed: I have independently reviewed following labs and imaging studies   CBC Recent Labs  Lab 09/17/23 1006 09/18/23 0058 09/19/23 0452 09/20/23 0858  WBC 20.4* 30.2* 23.0* 16.5*  HGB 7.9* 7.1* 7.3* 6.7*  HCT 24.3* 21.9* 21.5* 20.3*  PLT 276 246 236 230  MCV 94.6 95.2 93.9 94.9  MCH 30.7 30.9 31.9 31.3  MCHC 32.5 32.4 34.0 33.0  RDW 15.9* 16.0* 15.9* 15.9*  LYMPHSABS  --  1.7  --   --   MONOABS  --  2.1*  --   --   EOSABS  --  0.0  --   --   BASOSABS  --  0.1  --   --     Recent Labs  Lab 09/17/23 1006 09/18/23 0058 09/19/23 0452  NA 132* 130* 131*  K 3.1* 3.1* 3.2*  CL 96* 94* 97*  CO2 27 23 27   GLUCOSE 142* 86 98  BUN 9 12 5*  CREATININE 1.69* 1.85* 1.15  CALCIUM 7.8* 7.4* 7.1*  AST 27  --  18  ALT 30  --  21  ALKPHOS 106  --  99  BILITOT 0.8  --  0.5  ALBUMIN 1.6*  --  <1.5*  MG  --   --  1.5*  CRP  --   --  24.5*  INR  --  1.1  --   HGBA1C  --  5.1  --     ------------------------------------------------------------------------------------------------------------------ No  results for input(s): "CHOL", "HDL", "LDLCALC", "TRIG", "CHOLHDL", "LDLDIRECT" in the last 72 hours.  Lab Results  Component Value Date   HGBA1C 5.1 09/18/2023   ------------------------------------------------------------------------------------------------------------------ No results for input(s): "TSH", "T4TOTAL", "T3FREE", "THYROIDAB" in the last 72 hours.  Invalid input(s): "FREET3"  Cardiac Enzymes No results for input(s): "CKMB", "TROPONINI", "MYOGLOBIN" in the last 168 hours.  Invalid input(s): "CK" ------------------------------------------------------------------------------------------------------------------ No results found for: "BNP"  CBG: Recent Labs  Lab 09/19/23 0744 09/19/23 1121 09/19/23 1638 09/19/23 2000 09/20/23 0744  GLUCAP 79 110* 101* 236* 117*    Recent Results (from the past 240 hour(s))  Urine Culture     Status: Abnormal   Collection Time: 09/17/23  3:18 PM   Specimen: Urine, Catheterized  Result Value Ref Range Status   Specimen Description URINE, CATHETERIZED  Final   Special Requests   Final    NONE Performed at [facility], [address], [place], [place] [address]    Culture 80,000 COLONIES/mL PSEUDOMONAS AERUGINOSA (A)  Final   Report Status 09/20/2023 FINAL  Final   Organism ID, Bacteria PSEUDOMONAS AERUGINOSA (A)  Final      Susceptibility   Pseudomonas aeruginosa - MIC*    CEFTAZIDIME 4 SENSITIVE Sensitive     CIPROFLOXACIN 1 INTERMEDIATE Intermediate     GENTAMICIN <=1 SENSITIVE Sensitive     IMIPENEM 1 SENSITIVE Sensitive     PIP/TAZO 8 SENSITIVE Sensitive ug/mL    * 80,000 COLONIES/mL PSEUDOMONAS AERUGINOSA  Blood culture (routine x 2)     Status: None (Preliminary result)   Collection Time: 09/17/23  8:15 PM   Specimen: BLOOD RIGHT HAND  Result Value Ref Range Status   Specimen Description BLOOD RIGHT HAND  Final   Special Requests   Final    BOTTLES DRAWN AEROBIC AND ANAEROBIC Blood Culture results may not be  optimal due to an inadequate volume of blood received in culture bottles   Culture   Final    NO GROWTH 3 DAYS Performed at [facility], [address], [place], [place] [address]    Report Status PENDING  Incomplete  Blood culture (routine x 2)     Status: None (Preliminary result)   Collection Time: 09/17/23  8:24 PM   Specimen: BLOOD RIGHT ARM  Result Value Ref Range Status   Specimen Description BLOOD RIGHT ARM  Final   Special Requests   Final    BOTTLES DRAWN AEROBIC AND ANAEROBIC Blood Culture adequate volume   Culture   Final    NO GROWTH 3 DAYS Performed at [facility], [address] [place], [place] [address]    Report Status PENDING  Incomplete     Radiology Studies: No results found.   Pamella Pert, MD, PhD [facility]  Between 7 am - 7 pm I am available, please contact me via Amion (for emergencies) or Securechat (non urgent messages)  Between 7 pm - 7 am I am not available, please contact night coverage MD/APP via Amion

## 2023-09-20 NOTE — Consult Note (Signed)
Bruce Little 06-18-1971  [id number].    Requesting MD: Dr. 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  He has had an extensive course.  He was moved back down here to [facility] and subsequently to SNF.  He developed a sacral wound while in [place] according to the wife.  His wound remained stable while in [facility], but had decompensated at the SNF.  He is currently here due to N/V with UTI and a clogged g-tube.  We have been asked to evaluate his wound for further recommendations.  ROS: ROS: see HPI, this wound/sacral area does not hurt.  Family History  Problem Relation Age of Onset   Stroke Mother    Cancer Mother    Heart disease Father     Past Medical History:  Diagnosis Date   A-fib (HCC)    Anemia    low iron   Chronic kidney disease    COVID    has had it 2 times, one mild and one wasn't   DM2 (diabetes mellitus, type 2) (HCC)    History of blood transfusion    HTN (hypertension)    Osteomyelitis of fifth toe of left foot (HCC) 08/13/2021   Osteomyelitis of fourth toe of left foot (HCC) 08/13/2021   Pneumonia    Stroke (HCC) 05/19/2021   no residual effects.   Stroke [facility])     Past Surgical History:  Procedure Laterality Date   AMPUTATION Left 08/29/2021   Procedure: AMPUTATION OF FOURTH TOE AND RAY ALONG WITH REMAINING FITH METATARSAL;  Surgeon: Tarry Kos, MD;  Location: [facility] OR;  Service: Orthopedics;  Laterality: Left;   AMPUTATION Left 09/03/2021   Procedure: LISFRANC AMPUTATION;  Surgeon: Tarry Kos, MD;  Location: [facility] OR;  Service: Orthopedics;  Laterality: Left;   AMPUTATION Left 10/29/2021   Procedure: AMPUTATION BELOW KNEE -LEFT;  Surgeon: Tarry Kos, MD;  Location: [facility] OR;  Service: Orthopedics;  Laterality: Left;    APPLICATION OF WOUND VAC Left 07/07/2021   Procedure: APPLICATION OF WOUND VAC;  Surgeon: Tarry Kos, MD;  Location: [facility] OR;  Service: Orthopedics;  Laterality: Left;   APPLICATION OF WOUND VAC Left 08/29/2021   Procedure: APPLICATION OF WOUND VAC;  Surgeon: Tarry Kos, MD;  Location: [facility] OR;  Service: Orthopedics;  Laterality: Left;   I & D EXTREMITY Left 07/03/2021   Procedure: IRRIGATION AND DEBRIDEMENT ,FIFTH RAY  AMPUTATION LEFT FOOT, , WOUND VAC PLACEMENT;  Surgeon: Tarry Kos, MD;  Location: [facility];  Service: Orthopedics;  Laterality: Left;   I & D EXTREMITY Left 07/07/2021   Procedure: IRRIGATION AND DEBRIDEMENT LEFT FOOT;  Surgeon: Tarry Kos, MD;  Location[facility];  Service: Orthopedics;  Laterality: Left;   I & D EXTREMITY Left 08/29/2021   Procedure: IRRIGATION AND DEBRIDEMENT LEFT FOOT;  Surgeon: Tarry Kos, MD;  Location: [facility];  Service: Orthopedics;  Laterality: Left;   IR FLUORO GUIDE CV LINE RIGHT  07/09/2021   IR REMOVAL TUN CV CATH W/O FL  10/08/2021   IR REMOVAL TUN CV CATH W/O FL  07/13/2023   IR REPLACE G-TUBE SIMPLE WO FLUORO  09/20/2023   IR US GUIDE VASC ACCESS RIGHT  07/09/2021  VITRECTOMY Left    [facility] eye    Social History:  reports that he has never smoked. He has never used smokeless tobacco. He reports that he does not currently use alcohol. He reports that he does not use drugs.  Allergies: No Known Allergies  Medications Prior to Admission  Medication Sig Dispense Refill   albuterol (VENTOLIN HFA) 108 (90 Base) MCG/ACT inhaler Inhale 2 puffs into the lungs every 4 (four) hours as needed.     amiodarone (PACERONE) 100 MG tablet Take 100 mg by mouth daily.     amLODipine (NORVASC) 10 MG tablet Take 10 mg by mouth daily.     atorvastatin (LIPITOR) 40 MG tablet Take 40 mg by mouth at bedtime.     carvedilol (COREG) 12.5 MG tablet Take 37.5 mg by mouth 2 (two) times daily with a meal.     cloNIDine (CATAPRES) 0.1 MG tablet Take 0.3 mg by  mouth 3 (three) times daily.     finasteride (PROSCAR) 5 MG tablet Take 5 mg by mouth daily.     furosemide (LASIX) 40 MG tablet Take 40 mg by mouth daily.     hydrALAZINE (APRESOLINE) 25 MG tablet Take 25 mg by mouth every 8 (eight) hours.     LANTUS SOLOSTAR 100 UNIT/ML Solostar Pen Inject 5 Units into the skin every 12 (twelve) hours.     levETIRAcetam (KEPPRA) 750 MG tablet Take 1,500 mg by mouth 2 (two) times daily.     losartan (COZAAR) 100 MG tablet Take 100 mg by mouth daily.     Melatonin 10 MG TABS Take 1 tablet by mouth at bedtime.     ondansetron (ZOFRAN) 4 MG tablet Take 1 tablet (4 mg total) by mouth every 8 (eight) hours as needed for nausea or vomiting. 40 tablet 0   pantoprazole (PROTONIX) 40 MG tablet Take 1 tablet (40 mg total) by mouth daily. (Patient taking differently: Take 40 mg by mouth 2 (two) times daily.) 30 tablet 0   PARoxetine (PAXIL) 20 MG tablet Take 10 mg by mouth daily.     terazosin (HYTRIN) 1 MG capsule Take 1 mg by mouth at bedtime.     atorvastatin (LIPITOR) 80 MG tablet Take 1 tablet (80 mg total) by mouth every evening. (Patient not taking: Reported on 09/17/2023) 90 tablet 3   glipiZIDE (GLUCOTROL) 5 MG tablet Take 1 tablet (5 mg total) by mouth daily. 30 tablet 0     Physical Exam: Blood pressure 138/70, pulse 75, temperature 97.8 F (36.6 C), temperature source Oral, resp. rate 17, height 6\' 2"  (1.88 m), weight 93.6 kg, SpO2 96%. General: pleasant, chronically ill-appearing white male who is laying in bed in NAD HEENT: head with toboggan on.  Sclera are noninjected.  PERRL.  Ears and nose without any masses or lesions.  Mouth is pink  Lungs:  Respiratory effort nonlabored Abd: soft, NT, ND, +BS, no masses, hernias, or organomegaly Skin: warm and dry with no masses, lesions, or rashes.  Sacral wound is present with exposed sacrum noted.  No evidence of soft tissue infection.  There is a thin arc of fibrinous slough over part of the superior aspect of  the wound.  This was sharply debrided with a pair of scissors.  There remains a small residual amount across this area that is firmly attached to the wound bed.  The remaining aspect of his wound is granulation tissue. Psych: A&Ox3 with an appropriate affect.   Results for orders placed or performed  during the hospital encounter of 09/17/23 (from the past 48 hour(s))  Glucose, capillary     Status: Abnormal   Collection Time: 09/18/23  4:22 PM  Result Value Ref Range   Glucose-Capillary 141 (H) 70 - 99 mg/dL    Comment: Glucose reference range applies only to samples taken after fasting for at least 8 hours.  Glucose, capillary     Status: None   Collection Time: 09/19/23  1:50 AM  Result Value Ref Range   Glucose-Capillary 88 70 - 99 mg/dL    Comment: Glucose reference range applies only to samples taken after fasting for at least 8 hours.  Comprehensive metabolic panel     Status: Abnormal   Collection Time: 09/19/23  4:52 AM  Result Value Ref Range   Sodium 131 (L) 135 - 145 mmol/L   Potassium 3.2 (L) 3.5 - 5.1 mmol/L   Chloride 97 (L) 98 - 111 mmol/L   CO2 27 22 - 32 mmol/L   Glucose, Bld 98 70 - 99 mg/dL    Comment: Glucose reference range applies only to samples taken after fasting for at least 8 hours.   BUN 5 (L) 6 - 20 mg/dL   Creatinine, Ser 1.61 0.61 - 1.24 mg/dL   Calcium 7.1 (L) 8.9 - 10.3 mg/dL   Total Protein 4.9 (L) 6.5 - 8.1 g/dL   Albumin <0.9 (L) 3.5 - 5.0 g/dL   AST 18 15 - 41 U/L   ALT 21 0 - 44 U/L   Alkaline Phosphatase 99 38 - 126 U/L   Total Bilirubin 0.5 <1.2 mg/dL   GFR, Estimated >60 >45 mL/min    Comment: (NOTE) Calculated using the CKD-EPI Creatinine Equation (2021)    Anion gap 7 5 - 15    Comment: Performed at [facility], [address] [place], [place] [address]  CBC     Status: Abnormal   Collection Time: 09/19/23  4:52 AM  Result Value Ref Range   WBC 23.0 (H) 4.0 - 10.5 K/uL   RBC 2.29 (L) 4.22 - 5.81 MIL/uL   Hemoglobin 7.3 (L)  13.0 - 17.0 g/dL   HCT 19.1 (L) 47.8 - 29.5 %   MCV 93.9 80.0 - 100.0 fL   MCH 31.9 26.0 - 34.0 pg   MCHC 34.0 30.0 - 36.0 g/dL   RDW 62.1 (H) 30.8 - 65.7 %   Platelets 236 150 - 400 K/uL   nRBC 0.0 0.0 - 0.2 %    Comment: Performed at [facility], [address], [place], [place] [address]  Magnesium     Status: Abnormal   Collection Time: 09/19/23  4:52 AM  Result Value Ref Range   Magnesium 1.5 (L) 1.7 - 2.4 mg/dL    Comment: Performed at [facility], [address] [place], [place] [address]  VITAMIN D 25 Hydroxy (Vit-D Deficiency, Fractures)     Status: Abnormal   Collection Time: 09/19/23  4:52 AM  Result Value Ref Range   Vit D, 25-Hydroxy 28.32 (L) 30 - 100 ng/mL    Comment: (NOTE) Vitamin D deficiency has been defined by the Institute of Medicine  and an Endocrine Society practice guideline as a level of serum 25-OH  vitamin D less than 20 ng/mL (1,2). The Endocrine Society went on to  further define vitamin D insufficiency as a level between 21 and 29  ng/mL (2).  1. IOM (Institute of Medicine). 2010. Dietary reference intakes for  calcium  and D. [place]: [employer]. 2. Holick MF, Binkley [place], Bischoff-Ferrari HA, et al. Evaluation,  treatment, and prevention of vitamin D deficiency: an Endocrine  Society clinical practice guideline, JCEM. 2011 Jul; 96(7): 1911-30.  Performed at [facility], [address], [place], [place] [address]   C-reactive protein     Status: Abnormal   Collection Time: 09/19/23  4:52 AM  Result Value Ref Range   CRP 24.5 (H) <1.0 mg/dL    Comment: Performed at [facility], [address], [place], [place] [address]  Glucose, capillary     Status: Abnormal   Collection Time: 09/19/23  7:17 AM  Result Value Ref Range   Glucose-Capillary 63 (L) 70 - 99 mg/dL    Comment: Glucose reference range applies only to samples taken after fasting for at least 8 hours.  Glucose, capillary     Status:  None   Collection Time: 09/19/23  7:44 AM  Result Value Ref Range   Glucose-Capillary 79 70 - 99 mg/dL    Comment: Glucose reference range applies only to samples taken after fasting for at least 8 hours.  Glucose, capillary     Status: Abnormal   Collection Time: 09/19/23 11:21 AM  Result Value Ref Range   Glucose-Capillary 110 (H) 70 - 99 mg/dL    Comment: Glucose reference range applies only to samples taken after fasting for at least 8 hours.  Glucose, capillary     Status: Abnormal   Collection Time: 09/19/23  4:38 PM  Result Value Ref Range   Glucose-Capillary 101 (H) 70 - 99 mg/dL    Comment: Glucose reference range applies only to samples taken after fasting for at least 8 hours.  Glucose, capillary     Status: Abnormal   Collection Time: 09/19/23  8:00 PM  Result Value Ref Range   Glucose-Capillary 236 (H) 70 - 99 mg/dL    Comment: Glucose reference range applies only to samples taken after fasting for at least 8 hours.  Glucose, capillary     Status: Abnormal   Collection Time: 09/20/23  7:44 AM  Result Value Ref Range   Glucose-Capillary 117 (H) 70 - 99 mg/dL    Comment: Glucose reference range applies only to samples taken after fasting for at least 8 hours.  CBC     Status: Abnormal   Collection Time: 09/20/23  8:58 AM  Result Value Ref Range   WBC 16.5 (H) 4.0 - 10.5 K/uL   RBC 2.14 (L) 4.22 - 5.81 MIL/uL   Hemoglobin 6.7 (LL) 13.0 - 17.0 g/dL    Comment: REPEATED TO VERIFY THIS CRITICAL RESULT HAS VERIFIED AND BEEN CALLED TO FENSKI,R RN BY AMANDA LEONARD ON 11 11 2024 AT 0937, AND HAS BEEN READ BACK.     HCT 20.3 (L) 39.0 - 52.0 %   MCV 94.9 80.0 - 100.0 fL   MCH 31.3 26.0 - 34.0 pg   MCHC 33.0 30.0 - 36.0 g/dL   RDW 63.8 (H) 75.6 - 43.3 %   Platelets 230 150 - 400 K/uL   nRBC 0.0 0.0 - 0.2 %    Comment: Performed at [facility], [address] [place], [place] [address]  Glucose, capillary     Status: Abnormal   Collection Time: 09/20/23 11:12 AM   Result Value Ref Range   Glucose-Capillary 168 (H) 70 - 99 mg/dL    Comment: Glucose reference range applies only to samples taken after fasting  for at least 8 hours.  Type and screen [facility]     Status: None (Preliminary result)   Collection Time: 09/20/23 12:50 PM  Result Value Ref Range   ABO/RH(D) B POS    Antibody Screen NEG    Sample Expiration 09/23/2023,2359    Unit Number [id number]    Blood Component Type RBC LR PHER2    Unit division 00    Status of Unit ALLOCATED    Transfusion Status OK TO TRANSFUSE    Crossmatch Result      Compatible Performed at [facility], [address] [place], [place] [address]   Prepare RBC (crossmatch)     Status: None   Collection Time: 09/20/23 12:54 PM  Result Value Ref Range   Order Confirmation      ORDER PROCESSED BY BLOOD BANK Performed at [facility], [address] [place], [place] [address]    IR REPLACE G-TUBE SIMPLE WO FLUORO  Result Date: 09/20/2023 INDICATION: Patient with history of right sided hemorrhagic stroke with residual left hemiplegia s/p tracheostomy and gastrostomy placement this summer at outside hospital. Current gastrostomy has been clogged for several weeks per patient's wife. Request for removal of existing pull through gastrostomy and placement of new balloon retention gastrostomy for ongoing nutritional needs. EXAM: BEDSIDE REMOVAL AND PLACEMENT OF NEW GASTROSTOMY TUBE WITHIN EXISTING TRACT COMPARISON:  None Available. MEDICATIONS: None. CONTRAST:  30 mL gastrografin - administered into the gastric lumen FLUOROSCOPY TIME:  None COMPLICATIONS: None immediate. PROCEDURE: Informed written consent was obtained from the patient's wife Braven Hashagen after a discussion of the risks, benefits and alternatives to treatment. Questions regarding the procedure were encouraged and answered. A timeout was performed prior to the initiation of the procedure. The upper abdomen and external  portion of the existing gastrostomy tube was prepped and draped in the usual sterile fashion, and a sterile drape was applied covering the operative field. Maximum barrier sterile technique with sterile gowns and gloves were used for the procedure. A timeout was performed prior to the initiation of the procedure. The existing gastrostomy tube was removed in tact with gentle traction. Next, a 20 Fr balloon retention Entuit gastrostomy tube was lubricated with viscous lidocaine and placed within the existing tract. Immediate return of blood was noted within the lumen. The balloon was inflated with 20 mL of normal saline, pulled against the anterior inner lumen of the stomach and the external disc cinched to the skin. Both lumens flushed easily with 10 mL normal saline. KUB with gastrografin injection obtained post procedure which shows the gastrostomy tube to be within the gastric lumen. IMPRESSION: Successful removal of existing gastrostomy tube and placement of a new 20-French gastrostomy tube. The gastrostomy tube is ready for immediate use. Performed by Lynnette Caffey, PA-C Electronically Signed   By: Marliss Coots M.D.   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  He does have a stage IV sacral wound that is actually overall relatively clean.  There was a small arc of fibrinous tissue that was sharply debrided at the bedside with scissors.  There still remains a thin layer that is tightly adhered to the underlying tissue that was unable to be removed.  I have discussed with the primary service, but with WOC as well.  The patient has no documented history of osteomyelitis, although the  bone is exposed which puts him at risk for this, but the rest of his wound is overall fairly clean.  We will have WOC evaluate him tomorrow and if they are in agreement, place a wound VAC over  this wound.  If not, then NS WD dressing changes can be continued.  No other surgical needs otherwise at this time.  We will sign off.   I reviewed Consultant ID notes, hospitalist notes, last 24 h vitals and pain scores, last 48 h intake and output, last 24 h labs and trends, and last 24 h imaging results.  Letha Cape, [facility] 09/20/2023, 3:32 PM Please see Amion for pager number during day hours 7:00am-4:30pm or 7:00am -11:30am on weekends

## 2023-09-20 NOTE — Progress Notes (Signed)
[facility]  Date of Admission:  09/17/2023     Total days of antibiotics 4         ASSESSMENT:  Bruce Little continues to have improving leukocytosis with WBC count down to 16.5 in the setting of suspected Pseudomonas urinary tract infection. Tolerating Ceftazidime with no significant adverse side effects. Does have a sacral wound which does not appear overtly infected with inflammatory markers mildly elevated. May benefit from surgical evaluation to determine any need for debridement/hydrotherapy. Previously treated from 06/17/23 until 07/04/23 with Zosyn with cultures growing E. Coli, Morganella and Enterococcus faecalis Hopefully will be more mobile and able to offload that site and optimize nutrition otherwise healing of wound will be challenging. Awaiting bone flap/reconstruction and will defer timing to Neurosurgery. Continue current dose of ceftazidime with duration of treatment pending. Remaining medical and supportive care per Internal Medicine.   PLAN:  Continue current dose of Ceftazidime.  Offload sacral ulcer and optimize nutrition.  Surgical evaluation to determine any need for debridement.  Bone flap/reconstruction per Neurosurgery.  Remaining medical and supportive care per Internal Medicine.   I have personally spent 28 minutes involved in face-to-face and non-face-to-face activities for this patient on the day of the visit. Professional time spent includes the following activities: Preparing to see the patient (review of tests), Obtaining and/or reviewing separately obtained history (admission/discharge record), Performing a medically appropriate examination and/or evaluation , Ordering medications/tests/procedures, referring and communicating with other health care professionals, Documenting clinical information in the EMR, Independently interpreting results (not separately reported), Communicating results to the patient/family/caregiver, Counseling and  educating the patient/family/caregiver and Care coordination (not separately reported).    Principal Problem:   Vomiting Active Problems:   Renal insufficiency   HTN (hypertension)   UTI (urinary tract infection)   Leukocytosis   Sacral decubitus ulcer   ICH (intracerebral hemorrhage) (HCC)    sodium chloride   Intravenous Once   amiodarone  100 mg Oral Daily   amLODipine  10 mg Oral Daily   atorvastatin  40 mg Oral QHS   carvedilol  25 mg Oral BID WC   Chlorhexidine Gluconate Cloth  6 each Topical Q0600   cloNIDine  0.1 mg Oral TID   darbepoetin (ARANESP) injection - DIALYSIS  60 mcg Subcutaneous Q Sat-1800   feeding supplement  1 Container Oral TID BM   finasteride  5 mg Oral Daily   hydrALAZINE  25 mg Oral Q8H   influenza vac split trivalent PF  0.5 mL Intramuscular Tomorrow-1000   insulin aspart  0-5 Units Subcutaneous QHS   insulin aspart  0-6 Units Subcutaneous TID WC   levETIRAcetam  1,500 mg Oral BID   liver oil-zinc oxide   Topical BID   losartan  50 mg Oral Daily   multivitamin  1 tablet Oral QHS   ondansetron (ZOFRAN) IV  4 mg Intravenous Q12H   pantoprazole  40 mg Oral BID   PARoxetine  10 mg Oral Daily   sodium chloride flush  3 mL Intravenous Q12H   sodium hypochlorite   Irrigation Daily   terazosin  1 mg Oral QHS   Vitamin D (Ergocalciferol)  50,000 Units Oral Q7 days    SUBJECTIVE:  Afebrile overnight with no acute events. Continues to have nausea which is improved with ondansetron. Wife at bedside.   No Known Allergies   Review of Systems: Review of Systems  Constitutional:  Negative for chills, fever and weight loss.  Respiratory:  Negative  for cough, shortness of breath and wheezing.   Cardiovascular:  Negative for chest pain and leg swelling.  Gastrointestinal:  Positive for nausea. Negative for abdominal pain, constipation, diarrhea and vomiting.  Skin:  Negative for rash.      OBJECTIVE: Vitals:   09/19/23 1639 09/19/23 2000 09/20/23  0507 09/20/23 0747  BP: 128/67 122/65 133/75 138/70  Pulse: 74 78 74 75  Resp: 18   17  Temp: 98.5 F (36.9 C) 98.9 F (37.2 C) 98.3 F (36.8 C) 97.8 F (36.6 C)  TempSrc: Oral Oral Oral Oral  SpO2: 98% 97% 97% 96%  Weight:      Height:       Body mass index is 26.49 kg/m.  Physical Exam Constitutional:      General: He is not in acute distress.    Appearance: He is well-developed.  Cardiovascular:     Rate and Rhythm: Normal rate and regular rhythm.     Heart sounds: Normal heart sounds.  Pulmonary:     Effort: Pulmonary effort is normal.     Breath sounds: Normal breath sounds.  Skin:    General: Skin is warm and dry.  Neurological:     Mental Status: He is alert.     Comments: Right sided focus; left hemiparesis      Lab Results Lab Results  Component Value Date   WBC 16.5 (H) 09/20/2023   HGB 6.7 (LL) 09/20/2023   HCT 20.3 (L) 09/20/2023   MCV 94.9 09/20/2023   PLT 230 09/20/2023    Lab Results  Component Value Date   CREATININE 1.15 09/19/2023   BUN 5 (L) 09/19/2023   NA 131 (L) 09/19/2023   K 3.2 (L) 09/19/2023   CL 97 (L) 09/19/2023   CO2 27 09/19/2023    Lab Results  Component Value Date   ALT 21 09/19/2023   AST 18 09/19/2023   ALKPHOS 99 09/19/2023   BILITOT 0.5 09/19/2023     Microbiology: Recent Results (from the past 240 hour(s))  Urine Culture     Status: Abnormal   Collection Time: 09/17/23  3:18 PM   Specimen: Urine, Catheterized  Result Value Ref Range Status   Specimen Description URINE, CATHETERIZED  Final   Special Requests   Final    NONE Performed at [facility], [address], [place], [place] [address]    Culture 80,000 COLONIES/mL PSEUDOMONAS AERUGINOSA (A)  Final   Report Status 09/20/2023 FINAL  Final   Organism ID, Bacteria PSEUDOMONAS AERUGINOSA (A)  Final      Susceptibility   Pseudomonas aeruginosa - MIC*    CEFTAZIDIME 4 SENSITIVE Sensitive     CIPROFLOXACIN 1 INTERMEDIATE Intermediate      GENTAMICIN <=1 SENSITIVE Sensitive     IMIPENEM 1 SENSITIVE Sensitive     PIP/TAZO 8 SENSITIVE Sensitive ug/mL    * 80,000 COLONIES/mL PSEUDOMONAS AERUGINOSA  Blood culture (routine x 2)     Status: None (Preliminary result)   Collection Time: 09/17/23  8:15 PM   Specimen: BLOOD RIGHT HAND  Result Value Ref Range Status   Specimen Description BLOOD RIGHT HAND  Final   Special Requests   Final    BOTTLES DRAWN AEROBIC AND ANAEROBIC Blood Culture results may not be optimal due to an inadequate volume of blood received in culture bottles   Culture   Final    NO GROWTH 3 DAYS Performed at [facility], [address] [place], [place] [address]  Report Status PENDING  Incomplete  Blood culture (routine x 2)     Status: None (Preliminary result)   Collection Time: 09/17/23  8:24 PM   Specimen: BLOOD RIGHT ARM  Result Value Ref Range Status   Specimen Description BLOOD RIGHT ARM  Final   Special Requests   Final    BOTTLES DRAWN AEROBIC AND ANAEROBIC Blood Culture adequate volume   Culture   Final    NO GROWTH 3 DAYS Performed at [facility], [address] [place], [place] [address]    Report Status PENDING  Incomplete     Marcos Eke, NP [facility]  09/20/2023  12:08 PM

## 2023-09-21 DIAGNOSIS — R1110 Vomiting, unspecified: Secondary | ICD-10-CM | POA: Diagnosis not present

## 2023-09-21 LAB — CBC
HCT: 25.3 % — ABNORMAL LOW (ref 39.0–52.0)
HCT: 24.7 % — ABNORMAL LOW (ref 39.0–52.0)
Hemoglobin: 8.4 g/dL — ABNORMAL LOW (ref 13.0–17.0)
Hemoglobin: 8.2 g/dL — ABNORMAL LOW (ref 13.0–17.0)
MCH: 30.9 pg (ref 26.0–34.0)
MCH: 31.4 pg (ref 26.0–34.0)
MCHC: 33.2 g/dL (ref 30.0–36.0)
MCHC: 33.2 g/dL (ref 30.0–36.0)
MCV: 93 fL (ref 80.0–100.0)
MCV: 94.6 fL (ref 80.0–100.0)
Platelets: 251 10*3/uL (ref 150–400)
Platelets: 226 10*3/uL (ref 150–400)
RBC: 2.72 MIL/uL — ABNORMAL LOW (ref 4.22–5.81)
RBC: 2.61 MIL/uL — ABNORMAL LOW (ref 4.22–5.81)
RDW: 15.1 % (ref 11.5–15.5)
RDW: 15.3 % (ref 11.5–15.5)
WBC: 13.6 10*3/uL — ABNORMAL HIGH (ref 4.0–10.5)
WBC: 11.3 10*3/uL — ABNORMAL HIGH (ref 4.0–10.5)
nRBC: 0 % (ref 0.0–0.2)
nRBC: 0 % (ref 0.0–0.2)

## 2023-09-21 LAB — COMPREHENSIVE METABOLIC PANEL
ALT: 28 U/L (ref 0–44)
AST: 27 U/L (ref 15–41)
Albumin: <1.5 g/dL — ABNORMAL LOW (ref 3.5–5.0)
Alkaline Phosphatase: 117 U/L (ref 38–126)
Anion gap: 8 (ref 5–15)
BUN: 15 mg/dL (ref 6–20)
CO2: 27 mmol/L (ref 22–32)
Calcium: 7.5 mg/dL — ABNORMAL LOW (ref 8.9–10.3)
Chloride: 98 mmol/L (ref 98–111)
Creatinine, Ser: 2.63 mg/dL — ABNORMAL HIGH (ref 0.61–1.24)
GFR, Estimated: 29 mL/min — ABNORMAL LOW (ref >60)
Glucose, Bld: 124 mg/dL — ABNORMAL HIGH (ref 70–99)
Potassium: 3.5 mmol/L (ref 3.5–5.1)
Sodium: 133 mmol/L — ABNORMAL LOW (ref 135–145)
Total Bilirubin: 0.5 mg/dL (ref <1.2)
Total Protein: 5.3 g/dL — ABNORMAL LOW (ref 6.5–8.1)

## 2023-09-21 LAB — TYPE AND SCREEN
ABO/RH(D): B POS
Antibody Screen: NEGATIVE
Unit division: 0

## 2023-09-21 LAB — RENAL FUNCTION PANEL
Albumin: <1.5 g/dL — ABNORMAL LOW (ref 3.5–5.0)
Anion gap: 9 (ref 5–15)
BUN: 14 mg/dL (ref 6–20)
CO2: 24 mmol/L (ref 22–32)
Calcium: 7.2 mg/dL — ABNORMAL LOW (ref 8.9–10.3)
Chloride: 96 mmol/L — ABNORMAL LOW (ref 98–111)
Creatinine, Ser: 2.54 mg/dL — ABNORMAL HIGH (ref 0.61–1.24)
GFR, Estimated: 30 mL/min — ABNORMAL LOW (ref >60)
Glucose, Bld: 238 mg/dL — ABNORMAL HIGH (ref 70–99)
Phosphorus: 2.1 mg/dL — ABNORMAL LOW (ref 2.5–4.6)
Potassium: 3.7 mmol/L (ref 3.5–5.1)
Sodium: 129 mmol/L — ABNORMAL LOW (ref 135–145)

## 2023-09-21 LAB — MAGNESIUM: Magnesium: 1.4 mg/dL — ABNORMAL LOW (ref 1.7–2.4)

## 2023-09-21 LAB — BPAM RBC
Blood Product Expiration Date: 202411302359
ISSUE DATE / TIME: 202411111539
Unit Type and Rh: 7300

## 2023-09-21 LAB — GLUCOSE, CAPILLARY
Glucose-Capillary: 135 mg/dL — ABNORMAL HIGH (ref 70–99)
Glucose-Capillary: 230 mg/dL — ABNORMAL HIGH (ref 70–99)
Glucose-Capillary: 105 mg/dL — ABNORMAL HIGH (ref 70–99)
Glucose-Capillary: 168 mg/dL — ABNORMAL HIGH (ref 70–99)

## 2023-09-21 LAB — PHOSPHORUS: Phosphorus: 1.2 mg/dL — ABNORMAL LOW (ref 2.5–4.6)

## 2023-09-21 MED ORDER — OSMOLITE 1.5 CAL PO LIQD
1000.0000 mL | ORAL | Status: DC
  Administered 2023-09-21 – 2023-09-22 (×3): 1000 mL
  Filled 2023-09-21 (×4): qty 1000

## 2023-09-21 MED ORDER — INSULIN GLARGINE-YFGN 100 UNIT/ML ~~LOC~~ SOLN
5.0000 [IU] | Freq: Every day | SUBCUTANEOUS | Status: DC
  Administered 2023-09-21 – 2023-10-04 (×13): 5 [IU] via SUBCUTANEOUS
  Filled 2023-09-21 (×14): qty 0.05

## 2023-09-21 MED ORDER — PROSOURCE TF20 ENFIT COMPATIBL EN LIQD
60.0000 mL | Freq: Every day | ENTERAL | Status: DC
  Administered 2023-09-21 – 2023-09-22 (×2): 60 mL
  Filled 2023-09-21: qty 60

## 2023-09-21 MED ORDER — HEPARIN SODIUM (PORCINE) 1000 UNIT/ML IJ SOLN
3200.0000 [IU] | Freq: Once | INTRAMUSCULAR | Status: AC
  Administered 2023-09-21: 3200 [IU]

## 2023-09-21 MED ORDER — METHOCARBAMOL 500 MG PO TABS
500.0000 mg | ORAL_TABLET | Freq: Three times a day (TID) | ORAL | Status: DC | PRN
  Administered 2023-09-28 – 2023-10-01 (×3): 500 mg via ORAL
  Filled 2023-09-21 (×3): qty 1

## 2023-09-21 NOTE — Plan of Care (Signed)

## 2023-09-21 NOTE — Evaluation (Addendum)
Physical Therapy Evaluation Patient Details Name: Bruce Little MRN: [id number] DOB: Jun 28, 1971 Today's Date: 09/21/2023  History of Present Illness  Pt is a 52 year old male who presents with intermittent nausea and vomiting, fever, and clogged PEG tube; PEG tube replaced in IR 11/11; stage IV sacral wound as well; He had a prolonged hospitalization in [place] (04/30/2023- 07/01/2023) after severe CVA requiring craniotomy, tracheostomy, PEG.  Following [place] hospitalization, [facility] and was [facility] August 22 through August 10, 2023.  Worth mentioning is that he was decannulated on 07/23/2023, and weaned off to room air; from LTAC to SNF, where sacral wound worsened  Clinical Impression   Pt admitted with above diagnosis. Lives at home with wife, who provides care; Prior to admission, pt needed Max to total care for mobility and ADLs; At home, used hoyer lift for OOB transfers; Presents to PT with functional dependencies in mobility and self-care; Needs Max to total assist for all mobility;  Pt states he is working with HHPT/HHOT at home and wants to continue; Likely at his functional baseline, and DC home with 24 hour assist is reasonable, provided pt's wife is able to provide heavy assist; Worth considering [facility] for some more caregiving help; Pt currently with functional limitations due to the deficits listed below (see PT Problem List). Pt will benefit from skilled PT to increase their independence and safety with mobility to allow discharge to the venue listed below.     Noted pt and wife are looking to establish local follow up for Neurosurgery;   Recommend also PM&R consult to establish consistent follow up      If plan is discharge home, recommend the following: A lot of help with walking and/or transfers;A lot of help with bathing/dressing/bathroom   Can travel by private vehicle    They have a wheelchair accessible Geneticist, molecular Recommendations Other  (comment) (Overall well-equipped)  Recommendations for Other Services  Other (comment) (TOC/SW, connect with local medical [facility] transportation services (if not already connected)    Functional Status Assessment Patient has had a recent decline in their functional status and/or demonstrates limited ability to make significant improvements in function in a reasonable and predictable amount of time (though he is likely near his baseline)     Precautions / Restrictions Precautions Precautions: Fall;Other (comment) Precaution Comments: Crani with bone flap still out; helmet when OOB; reported dizziness sitting EOB, consider supine and sitting BPs Restrictions Weight Bearing Restrictions: No      Mobility  Bed Mobility Overal bed mobility: Needs Assistance Bed Mobility: Supine to Sit, Sit to Supine, Rolling Rolling: Max assist   Supine to sit: +2 for physical assistance, Total assist Sit to supine: +2 for physical assistance, Total assist   General bed mobility comments: Heavy use of bed pad; able to use RUE to reach across for bedrail for L rolling; Tot assist to elevate trunk to EOB; Pt max A to roll to L side, max-total A for in/out of bed.    Transfers                        Ambulation/Gait                  Stairs            Wheelchair Mobility     Tilt Bed    Modified Rankin (Stroke Patients Only)       Balance Overall balance assessment: Needs  assistance   Sitting balance-Leahy Scale: Poor Sitting balance - Comments: Post lean, and noted tendency to push with RUE                                     Pertinent Vitals/Pain Pain Assessment Pain Assessment: Faces Faces Pain Scale: Hurts even more Pain Location: Grimace with intermittent spasm LUE and LLE; Grimace with neck rotation and lateral flexion stretches Pain Descriptors / Indicators: Grimacing Pain Intervention(s): Monitored during session, Limited activity within  patient's tolerance    Home Living Family/patient expects to be discharged to:: Private residence Living Arrangements: Spouse/significant other Available Help at Discharge: Family Type of Home: House Home Access: Ramped entrance       Home Layout: Two level;Able to live on main level with bedroom/bathroom Home Equipment: Hospital bed;Shower seat;Other (comment) (hoyer lift, specialty chair) Additional Comments: Pt lives with wife who assists with all ADLs.    Prior Function Prior Level of Function : Needs assist             Mobility Comments: Pt reports his wife uses the hoyer lift to get OOB to his wheelchair ADLs Comments: max to total for bathing/dressing/transfers, able to self feed with set up     Extremity/Trunk Assessment   Upper Extremity Assessment Upper Extremity Assessment: LUE deficits/detail LUE Deficits / Details: L hemi, flaccid, good PROM LUE: Shoulder pain with ROM LUE Coordination: decreased gross motor;decreased fine motor    Lower Extremity Assessment Lower Extremity Assessment: RLE deficits/detail;LLE deficits/detail RLE Deficits / Details: Able to actively flex/extend hip and knee; limited knee flexion range, full extension knee, extension hip to neutral, and adequate hip flexion for sitting; ankle passive dorsiflexion to neutral LLE Deficits / Details: 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); tending to keep knee in very flexed position; Able to extend knee passivley, however pulls back into flexion with spasm       Communication   Communication Communication: Other (comment) (increased time to answer questions)  Cognition Arousal: Alert Behavior During Therapy: WFL for tasks assessed/performed, Flat affect Overall Cognitive Status: History of cognitive impairments - at baseline Area of Impairment: Following commands                       Following Commands: Follows one step commands with increased time        General Comments: Pt states it is october 17th, 2024,        General Comments General comments (skin integrity, edema, etc.): Pt participating well during session; Reported some dizziness sitting EOB, and opted to help pt back to supine, semi L sidelying    Exercises     Assessment/Plan    PT Assessment Patient needs continued PT services  PT Problem List Decreased strength;Decreased range of motion;Decreased activity tolerance;Decreased balance;Decreased mobility;Decreased coordination;Decreased safety awareness;Decreased knowledge of precautions;Impaired tone;Decreased skin integrity       PT Treatment Interventions DME instruction;Functional mobility training;Therapeutic activities;Balance training;Therapeutic exercise;Neuromuscular re-education;Patient/family education;Wheelchair mobility training    PT Goals (Current goals can be found in the Care Plan section)  Acute Rehab PT Goals Patient Stated Goal: Agreeable to working with PT PT Goal Formulation: Patient unable to participate in goal setting Time For Goal Achievement: 10/05/23 Potential to Achieve Goals: Fair    Frequency Min 1X/week     Co-evaluation  AM-PAC PT "6 Clicks" Mobility  Outcome Measure Help needed turning from your back to your side while in a flat bed without using bedrails?: A Lot Help needed moving from lying on your back to sitting on the side of a flat bed without using bedrails?: Total Help needed moving to and from a bed to a chair (including a wheelchair)?: Total Help needed standing up from a chair using your arms (e.g., wheelchair or bedside chair)?: Total Help needed to walk in hospital room?: Total Help needed climbing 3-5 steps with a railing? : Total 6 Click Score: 7    End of Session Equipment Utilized During Treatment: Other (comment) (bed pads) Activity Tolerance: Patient tolerated treatment well Patient left: in bed;with call bell/phone within reach;with  bed alarm set;Other (comment) (Lab in for venipuncture) Nurse Communication: Mobility status PT Visit Diagnosis: Other abnormalities of gait and mobility (R26.89);Other symptoms and signs involving the nervous system (R29.898);Hemiplegia and hemiparesis Hemiplegia - Right/Left: Left Hemiplegia - caused by: Cerebral infarction    Time: 1610-9604 PT Time Calculation (min) (ACUTE ONLY): 22 min   Charges:   PT Evaluation $PT Eval Moderate Complexity: 1 Mod   PT General Charges $$ ACUTE PT VISIT: 1 Visit         Van Clines, PT  Acute Rehabilitation Services Office [phone] Secure Chat welcomed   [name] 09/21/2023, 12:50 PM

## 2023-09-21 NOTE — Progress Notes (Signed)
SLP Cancellation Note  Patient Details Name: Bruce Little MRN: [id number] DOB: Feb 16, 1971   Cancelled treatment:       Reason Eval/Treat Not Completed: Patient at procedure or test/unavailable.  Pt currently with OT and wound care team.  SLP will f/u for treatment as schedule allows.     Shanon Rosser Dacotah Cabello 09/21/2023, 11:11 AM

## 2023-09-21 NOTE — Progress Notes (Signed)
PROGRESS NOTE  Bruce Little DGU:[id number] DOB: [birth year] DOA: 09/17/2023 PCP: Westley Hummer, MD   LOS: 4 days   Brief Narrative / Interim history: This is a 52 year old male with DM, PAF on Eliquis in the past, CKD now on dialysis, left BKA who comes into the hospital with intermittent nausea and vomiting, fever, and clogged PEG tube.  He has had a prolonged hospitalization at [facility] in [place] in August, admitted there 04/30/2023 and discharged 07/01/2023.  Hospital course reviewed.  He was visiting [place] from [place], was in a hotel when he was found to have altered mental status, vomiting.  He was found to be hypertensive in the ER with a blood pressure of 226/100, and a CT of the head showed 9.2 x 5.5 cm right frontal temporal parenchymal bleed with edema, mass effect and 1.1 cm left midline shift.  He is status post craniectomy and hematoma evacuation and EVD placement.  Hospital course complicated by Staph epidermidis in the CSF 7/17, will repeat growth 7/22 and 7/24.  Eventually EVD was removed, and there were no plans to replace the bone flap and will need artificial plate eventually.  He will developed sacral decubitus ulcer and underwent serial debridements, sacral wounds grew E. coli, Morganella and Enterococcus faecalis and placed of antibiotics for several weeks.  Hospital course was also complicated by C. difficile diarrhea status post full course of vancomycin while being on IV antibiotics also, and in addition, had a PEG and a trach and developed renal failure requiring dialysis.  Following [place] hospitalization, [facility] and he was there August 22 through August 10, 2023.  Worth mentioning is that he was decannulated on 07/23/2023, and weaned off to room air, and also with ongoing SLP he was started on p.o. intake along with his PEG tube.  He has been in rehab through October, but apparently has been home for couple of  weeks prior to being here.  Subjective / 24h Interval events: Denies any nausea this morning.  No vomiting.  No chest discomfort or shortness of breath  Assesement and Plan: Principal problem SIRS with fever, leukocytosis -patient was febrile to 100.7 on admission, and white count was 20K, and increased to 30K the next day.  There were initial concerns for UTI, he is chronic Foley was replaced in the ER and he was started on ceftriaxone.  Cultures were sent, continue to monitor.  Apparently the fever has been on and off for quite some time and at 1 point it is mentioned in the chart that someone thought they may be attributed to neurogenic fevers.  However, in the past 2 weeks, while at home, was not febrile up until the day prior to admission -White count improving, this morning blood work still pending, will follow  Active problems History of PAF-currently in sinus rhythm.  He is on amiodarone, Coreg, continue.  No longer on anticoagulation following his ICH in August  Nausea, vomiting-on a daily basis, one of the main problems per wife.  He is now on scheduled Zofran and this seems resolved.  Tube feeds will be started today and will continue to monitor  History of ICH-closely monitor mental status.  CT scan done on admission does not show any evidence of recurrent bleed.  He has established with local neurosurgery, I do not have direct access to notes but there were plans in place from [facility] hospitalization to eventually have a plate fitted to his cranium  ESRD-nephrology following, getting dialysis while here  Chronic urinary retention-with chronic Foley  Stage IV sacral decubitus ulcer, POA-CT scan this admission showed resorption of the inferior sacrum and coccyx without abscess.  ID consulted as well.  Underwent debridement by general surgery in August, and no further debridements were recommended following his [facility] stay -Surgery consulted, underwent bedside debridement 11/11.  Wound  care to see today and evaluate whether a wound VAC can be fitted  Hyponatremia-in the setting of renal disease  Hypokalemia-continue to monitor and replenish as indicated  Vitamin D deficiency-start supplementation  Anemia-of chronic renal disease as well as chronic illness.  Transfuse unit of packed red blood cells for hemoglobin of 6.7.  Hemoglobin improved appropriately, today CBC pending  Clogged PEG tube-IR consulted, his PEG tube was replaced on 11/11.  RD consult today pending to initiate tube feeds.  Will prefer to do continues rather bolus to minimize risk of nausea and vomiting  Status post trach-now decannulated, on room air  History of seizures-continue Keppra  Essential hypertension-continue antihypertensives as below, blood pressure stable this morning  Type 2 diabetes mellitus-A1c pretty low, glargine has been discontinued due to stable CBGs, but reintroduced today at a lower dose given that he will start tube feeds also  Lab Results  Component Value Date   HGBA1C 5.1 09/18/2023   CBG (last 3)  Recent Labs    09/20/23 1606 09/20/23 2015 09/21/23 0801  GLUCAP 220* 198* 135*    Scheduled Meds:  sodium chloride   Intravenous Once   amiodarone  100 mg Oral Daily   amLODipine  10 mg Oral Daily   atorvastatin  40 mg Oral QHS   carvedilol  25 mg Oral BID WC   Chlorhexidine Gluconate Cloth  6 each Topical Q0600   cloNIDine  0.1 mg Oral TID   darbepoetin (ARANESP) injection - DIALYSIS  60 mcg Subcutaneous Q Sat-1800   feeding supplement  1 Container Oral TID BM   finasteride  5 mg Oral Daily   hydrALAZINE  25 mg Oral Q8H   influenza vac split trivalent PF  0.5 mL Intramuscular Tomorrow-1000   insulin aspart  0-5 Units Subcutaneous QHS   insulin aspart  0-6 Units Subcutaneous TID WC   levETIRAcetam  1,500 mg Oral BID   liver oil-zinc oxide   Topical BID   losartan  50 mg Oral Daily   multivitamin  1 tablet Oral QHS   ondansetron (ZOFRAN) IV  4 mg Intravenous  Q12H   pantoprazole  40 mg Oral BID   PARoxetine  10 mg Oral Daily   sodium chloride flush  3 mL Intravenous Q12H   terazosin  1 mg Oral QHS   Vitamin D (Ergocalciferol)  50,000 Units Oral Q7 days   Continuous Infusions:  cefTAZidime (FORTAZ)  IV     PRN Meds:.acetaminophen **OR** acetaminophen, albuterol, food thickener, methocarbamol, polyethylene glycol, prochlorperazine, sorbitol  Current Outpatient Medications  Medication Instructions   albuterol (VENTOLIN HFA) 108 (90 Base) MCG/ACT inhaler 2 puffs, Inhalation, Every 4 hours PRN   amiodarone (PACERONE) 100 mg, Oral, Daily   amLODipine (NORVASC) 10 mg, Oral, Daily   atorvastatin (LIPITOR) 80 mg, Oral, Every evening   atorvastatin (LIPITOR) 40 mg, Oral, Daily at bedtime   carvedilol (COREG) 37.5 mg, Oral, 2 times daily with meals   cloNIDine (CATAPRES) 0.3 mg, Oral, 3 times daily   finasteride (PROSCAR) 5 mg, Oral, Daily   furosemide (LASIX) 40 mg, Oral, Daily   glipiZIDE (GLUCOTROL) 5 mg,  Oral, Daily   hydrALAZINE (APRESOLINE) 25 mg, Oral, Every 8 hours   Lantus SoloStar 5 Units, Subcutaneous, Every 12 hours   levETIRAcetam (KEPPRA) 1,500 mg, Oral, 2 times daily   losartan (COZAAR) 100 mg, Oral, Daily   Melatonin 10 MG TABS 1 tablet, Oral, Daily at bedtime   ondansetron (ZOFRAN) 4 mg, Oral, Every 8 hours PRN   pantoprazole (PROTONIX) 40 mg, Oral, Daily   PARoxetine (PAXIL) 10 mg, Oral, Daily   terazosin (HYTRIN) 1 mg, Oral, Daily at bedtime    Diet Orders (From admission, onward)     Start     Ordered   09/18/23 1350  Diet clear liquid Room service appropriate? Yes; Fluid consistency: Thin  Diet effective now       Question Answer Comment  Room service appropriate? Yes   Fluid consistency: Thin      09/18/23 1351            DVT prophylaxis: SCDs Start: 09/17/23 2336   Lab Results  Component Value Date   PLT 230 09/20/2023      Code Status: Full Code  Family Communication: Discussed with wife  [name]  Status is: Inpatient Remains inpatient appropriate because: severity of illness  Level of care: Telemetry Medical  Consultants:  Nephrology ID  Objective: Vitals:   09/20/23 1823 09/20/23 2018 09/21/23 0531 09/21/23 0801  BP: 113/60 130/70 136/67 (!) 146/76  Pulse: 70 68 77 78  Resp: 18 20  16   Temp: 98.5 F (36.9 C) 98.4 F (36.9 C) 97.6 F (36.4 C) 98.2 F (36.8 C)  TempSrc: Oral Oral Oral Oral  SpO2: 99% 98% 96% 99%  Weight:      Height:        Intake/Output Summary (Last 24 hours) at 09/21/2023 0914 Last data filed at 09/21/2023 0551 Gross per 24 hour  Intake 844 ml  Output 600 ml  Net 244 ml   Wt Readings from Last 3 Encounters:  09/18/23 93.6 kg  04/14/23 111.1 kg  10/29/21 86.1 kg    Examination:  Constitutional: NAD Eyes: lids and conjunctivae normal, no scleral icterus ENMT: mmm Neck: normal, supple Respiratory: clear to auscultation bilaterally, no wheezing, no crackles. Normal respiratory effort.  Cardiovascular: Regular rate and rhythm, no murmurs / rubs / gallops. No LE edema. Abdomen: soft, no distention, no tenderness. Bowel sounds positive.   Data Reviewed: I have independently reviewed following labs and imaging studies   CBC Recent Labs  Lab 09/17/23 1006 09/18/23 0058 09/19/23 0452 09/20/23 0858 09/20/23 2004  WBC 20.4* 30.2* 23.0* 16.5*  --   HGB 7.9* 7.1* 7.3* 6.7* 7.5*  HCT 24.3* 21.9* 21.5* 20.3* 22.6*  PLT 276 246 236 230  --   MCV 94.6 95.2 93.9 94.9  --   MCH 30.7 30.9 31.9 31.3  --   MCHC 32.5 32.4 34.0 33.0  --   RDW 15.9* 16.0* 15.9* 15.9*  --   LYMPHSABS  --  1.7  --   --   --   MONOABS  --  2.1*  --   --   --   EOSABS  --  0.0  --   --   --   BASOSABS  --  0.1  --   --   --     Recent Labs  Lab 09/17/23 1006 09/18/23 0058 09/19/23 0452  NA 132* 130* 131*  K 3.1* 3.1* 3.2*  CL 96* 94* 97*  CO2 27 23 27   GLUCOSE 142* 86 98  BUN 9 12 5*  CREATININE 1.69* 1.85* 1.15  CALCIUM 7.8* 7.4* 7.1*  AST  27  --  18  ALT 30  --  21  ALKPHOS 106  --  99  BILITOT 0.8  --  0.5  ALBUMIN 1.6*  --  <1.5*  MG  --   --  1.5*  CRP  --   --  24.5*  INR  --  1.1  --   HGBA1C  --  5.1  --     ------------------------------------------------------------------------------------------------------------------ No results for input(s): "CHOL", "HDL", "LDLCALC", "TRIG", "CHOLHDL", "LDLDIRECT" in the last 72 hours.  Lab Results  Component Value Date   HGBA1C 5.1 09/18/2023   ------------------------------------------------------------------------------------------------------------------ No results for input(s): "TSH", "T4TOTAL", "T3FREE", "THYROIDAB" in the last 72 hours.  Invalid input(s): "FREET3"  Cardiac Enzymes No results for input(s): "CKMB", "TROPONINI", "MYOGLOBIN" in the last 168 hours.  Invalid input(s): "CK" ------------------------------------------------------------------------------------------------------------------ No results found for: "BNP"  CBG: Recent Labs  Lab 09/20/23 0744 09/20/23 1112 09/20/23 1606 09/20/23 2015 09/21/23 0801  GLUCAP 117* 168* 220* 198* 135*    Recent Results (from the past 240 hour(s))  Urine Culture     Status: Abnormal   Collection Time: 09/17/23  3:18 PM   Specimen: Urine, Catheterized  Result Value Ref Range Status   Specimen Description URINE, CATHETERIZED  Final   Special Requests   Final    NONE Performed at [facility], [address], [place], [place] [address]    Culture 80,000 COLONIES/mL PSEUDOMONAS AERUGINOSA (A)  Final   Report Status 09/20/2023 FINAL  Final   Organism ID, Bacteria PSEUDOMONAS AERUGINOSA (A)  Final      Susceptibility   Pseudomonas aeruginosa - MIC*    CEFTAZIDIME 4 SENSITIVE Sensitive     CIPROFLOXACIN 1 INTERMEDIATE Intermediate     GENTAMICIN <=1 SENSITIVE Sensitive     IMIPENEM 1 SENSITIVE Sensitive     PIP/TAZO 8 SENSITIVE Sensitive ug/mL    * 80,000 COLONIES/mL PSEUDOMONAS AERUGINOSA   Blood culture (routine x 2)     Status: None (Preliminary result)   Collection Time: 09/17/23  8:15 PM   Specimen: BLOOD RIGHT HAND  Result Value Ref Range Status   Specimen Description BLOOD RIGHT HAND  Final   Special Requests   Final    BOTTLES DRAWN AEROBIC AND ANAEROBIC Blood Culture results may not be optimal due to an inadequate volume of blood received in culture bottles   Culture   Final    NO GROWTH 4 DAYS Performed at [facility], [address] [place], [place] [address]    Report Status PENDING  Incomplete  Blood culture (routine x 2)     Status: None (Preliminary result)   Collection Time: 09/17/23  8:24 PM   Specimen: BLOOD RIGHT ARM  Result Value Ref Range Status   Specimen Description BLOOD RIGHT ARM  Final   Special Requests   Final    BOTTLES DRAWN AEROBIC AND ANAEROBIC Blood Culture adequate volume   Culture   Final    NO GROWTH 4 DAYS Performed at [facility], [address], [place], [place] [address]    Report Status PENDING  Incomplete     Radiology Studies: DG ABDOMEN PEG TUBE LOCATION  Result Date: 09/20/2023 CLINICAL DATA:  [id number] PEG (percutaneous endoscopic gastrostomy) adjustment/replacement/removal (HCC) [id number] EXAM: ABDOMEN - 1 VIEW COMPARISON:  CT abdomen pelvis 09/17/23, x-ray abdomen 08/02/2023 FINDINGS: Gastrostomy tube overlies the gastric lumen with PO  contrast partially opacifying the gastric lumen. The bowel gas pattern is normal. No radio-opaque calculi or other significant radiographic abnormality are seen. IMPRESSION: Gastrostomy tube in appropriate position. Electronically Signed   By: Tish Frederickson M.D.   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 Current gastrostomy has been clogged for several weeks per  patient's wife. Request for removal of existing pull through gastrostomy and placement of new balloon retention gastrostomy for ongoing nutritional needs. EXAM: BEDSIDE REMOVAL AND PLACEMENT OF NEW GASTROSTOMY TUBE WITHIN EXISTING TRACT COMPARISON:  None Available. MEDICATIONS: None. CONTRAST:  30 mL gastrografin - administered into the gastric lumen FLUOROSCOPY TIME:  None COMPLICATIONS: None immediate. PROCEDURE: Informed written consent was obtained from the patient's wife Gatlyn Latray after a discussion of the risks, benefits and alternatives to treatment. Questions regarding the procedure were encouraged and answered. A timeout was performed prior to the initiation of the procedure. The upper abdomen and external portion of the existing gastrostomy tube was prepped and draped in the usual sterile fashion, and a sterile drape was applied covering the operative field. Maximum barrier sterile technique with sterile gowns and gloves were used for the procedure. A timeout was performed prior to the initiation of the procedure. The existing gastrostomy tube was removed in tact with gentle traction. Next, a 20 Fr balloon retention Entuit gastrostomy tube was lubricated with viscous lidocaine and placed within the existing tract. Immediate return of blood was noted within the lumen. The balloon was inflated with 20 mL of normal saline, pulled against the anterior inner lumen of the stomach and the external disc cinched to the skin. Both lumens flushed easily with 10 mL normal saline. KUB with gastrografin injection obtained post procedure which shows the gastrostomy tube to be within the gastric lumen. IMPRESSION: Successful removal of existing gastrostomy tube and placement of a new 20-French gastrostomy tube. The gastrostomy tube is ready for immediate use. Performed by Lynnette Caffey, PA-C Electronically Signed   By: Marliss Coots M.D.   On: 09/20/2023 14:45     Pamella Pert, MD, PhD [facility]  Between 7 am - 7 pm I am available, please contact me via Amion (for emergencies) or Securechat (non urgent messages)  Between 7 pm - 7 am I am not available, please contact night coverage MD/APP via Amion

## 2023-09-21 NOTE — Progress Notes (Signed)
Received  patient in bed.Alert and oriented x 4..Consent verified                                  Access used : Right HD catheter that worked well.Dressing on date.  Medicine given: Elita Quick 1 g.  Duration of treatment: 3.14 hours.  UF goal : 2 L  Hemo comment:Last 16  minutes of his treatment ,circuit clogged,did not returned blood due to blood clots.Needs heparin on his next treatment.  Hand off to the patient's nurse.Back into his room with stable medical condition via transporter.

## 2023-09-21 NOTE — Consult Note (Addendum)
WOC Nurse Consult Note: Reason for Consult: Surgical team following for assessment and plan of care.  WOC team requested to apply Vac dressing to chronic Stage 4 pressure injury to the sacrum Pressure Injury 7X10X4cm with 1 cm undermining to wound edges. 85% 10% dark purple, 5% yellow adhered slough, bone palpable, mod amt tan drainage POA: Yes Dressing procedure/placement/frequency: Pt denied need for pain meds prior to the procedure and tolerated without discomfort. Applied one piece black foam and barrier ring to lower wound edges; it will be difficult to maintain a seal related to the close proximity to the rectum and patient is frequently incontinent. Bridged track pad away from the affected area to hip. Cont suction on at .  WOC will plan to change again on Thurs and then begin Mon/Thurs change schedule next week. Thank-you,  Cammie Mcgee MSN, RN, CWOCN, [place], CNS [phone]

## 2023-09-21 NOTE — Progress Notes (Signed)
[facility] Progress Note   Subjective:   Patient seen in room, just had sacral wound vac placed and is very tired.   Objective Vitals:   09/20/23 1823 09/20/23 2018 09/21/23 0531 09/21/23 0801  BP: 113/60 130/70 136/67 (!) 146/76  Pulse: 70 68 77 78  Resp: 18 20  16   Temp: 98.5 F (36.9 C) 98.4 F (36.9 C) 97.6 F (36.4 C) 98.2 F (36.8 C)  TempSrc: Oral Oral Oral Oral  SpO2: 99% 98% 96% 99%  Weight:      Height:       Physical Exam General: Sleeping male, awakens briefly to voice Heart: RRR, no murmurs, rubs or gallops Lungs: CTA bilaterally, respirations unlabored on RA Abdomen: Soft, non-distended, +BS Extremities: No edema b/l lower extremities Dialysis Access: [facility] with intact bandage  Additional Objective Labs: Basic Metabolic Panel: Recent Labs  Lab 09/17/23 1006 09/18/23 0058 09/19/23 0452  NA 132* 130* 131*  K 3.1* 3.1* 3.2*  CL 96* 94* 97*  CO2 27 23 27   GLUCOSE 142* 86 98  BUN 9 12 5*  CREATININE 1.69* 1.85* 1.15  CALCIUM 7.8* 7.4* 7.1*   Liver Function Tests: Recent Labs  Lab 09/17/23 1006 09/19/23 0452  AST 27 18  ALT 30 21  ALKPHOS 106 99  BILITOT 0.8 0.5  PROT 5.4* 4.9*  ALBUMIN 1.6* <1.5*   Recent Labs  Lab 09/17/23 1006  LIPASE 43   CBC: Recent Labs  Lab 09/17/23 1006 09/18/23 0058 09/19/23 0452 09/20/23 0858 09/20/23 2004 09/21/23 0831  WBC 20.4* 30.2* 23.0* 16.5*  --  13.6*  NEUTROABS  --  26.0*  --   --   --   --   HGB 7.9* 7.1* 7.3* 6.7* 7.5* 8.4*  HCT 24.3* 21.9* 21.5* 20.3* 22.6* 25.3*  MCV 94.6 95.2 93.9 94.9  --  93.0  PLT 276 246 236 230  --  251   Blood Culture    Component Value Date/Time   SDES BLOOD RIGHT ARM 09/17/2023 2024   SPECREQUEST  09/17/2023 2024    BOTTLES DRAWN AEROBIC AND ANAEROBIC Blood Culture adequate volume   CULT  09/17/2023 2024    NO GROWTH 4 DAYS Performed at [facility], [address], [place], [place] [address]    REPTSTATUS PENDING 09/17/2023 2024     Cardiac Enzymes: No results for input(s): "CKTOTAL", "CKMB", "CKMBINDEX", "TROPONINI" in the last 168 hours. CBG: Recent Labs  Lab 09/20/23 0744 09/20/23 1112 09/20/23 1606 09/20/23 2015 09/21/23 0801  GLUCAP 117* 168* 220* 198* 135*   Iron Studies: No results for input(s): "IRON", "TIBC", "TRANSFERRIN", "FERRITIN" in the last 72 hours. @lablastinr3 @ Studies/Results: DG ABDOMEN PEG TUBE LOCATION  Result Date: 09/20/2023 CLINICAL DATA:  [id number] PEG (percutaneous endoscopic gastrostomy) adjustment/replacement/removal (HCC) [id number] EXAM: ABDOMEN - 1 VIEW COMPARISON:  CT abdomen pelvis 09/17/23, x-ray abdomen 08/02/2023 FINDINGS: Gastrostomy tube overlies the gastric lumen with PO contrast partially opacifying the gastric lumen. The bowel gas pattern is normal. No radio-opaque calculi or other significant radiographic abnormality are seen. IMPRESSION: Gastrostomy tube in appropriate position. Electronically Signed   By: Tish Frederickson M.D.   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 Current gastrostomy has been clogged for several weeks per patient's wife. Request for removal of existing pull through gastrostomy and placement of new  balloon retention gastrostomy for ongoing nutritional needs. EXAM: BEDSIDE REMOVAL AND PLACEMENT OF NEW GASTROSTOMY TUBE WITHIN EXISTING TRACT COMPARISON:  None Available. MEDICATIONS: None. CONTRAST:  30 mL gastrografin - administered into the gastric lumen FLUOROSCOPY TIME:  None COMPLICATIONS: None immediate. PROCEDURE: Informed written consent was obtained from the patient's wife Abimelec Diprima after a discussion of the risks, benefits and alternatives to treatment. Questions regarding the procedure were encouraged and answered. A timeout was performed  prior to the initiation of the procedure. The upper abdomen and external portion of the existing gastrostomy tube was prepped and draped in the usual sterile fashion, and a sterile drape was applied covering the operative field. Maximum barrier sterile technique with sterile gowns and gloves were used for the procedure. A timeout was performed prior to the initiation of the procedure. The existing gastrostomy tube was removed in tact with gentle traction. Next, a 20 Fr balloon retention Entuit gastrostomy tube was lubricated with viscous lidocaine and placed within the existing tract. Immediate return of blood was noted within the lumen. The balloon was inflated with 20 mL of normal saline, pulled against the anterior inner lumen of the stomach and the external disc cinched to the skin. Both lumens flushed easily with 10 mL normal saline. KUB with gastrografin injection obtained post procedure which shows the gastrostomy tube to be within the gastric lumen. IMPRESSION: Successful removal of existing gastrostomy tube and placement of a new 20-French gastrostomy tube. The gastrostomy tube is ready for immediate use. Performed by Lynnette Caffey, PA-C Electronically Signed   By: Marliss Coots M.D.   On: 09/20/2023 14:45   Medications:  cefTAZidime (FORTAZ)  IV      sodium chloride   Intravenous Once   amiodarone  100 mg Oral Daily   amLODipine  10 mg Oral Daily   atorvastatin  40 mg Oral QHS   carvedilol  25 mg Oral BID WC   Chlorhexidine Gluconate Cloth  6 each Topical Q0600   cloNIDine  0.1 mg Oral TID   darbepoetin (ARANESP) injection - DIALYSIS  60 mcg Subcutaneous Q Sat-1800   feeding supplement  1 Container Oral TID BM   finasteride  5 mg Oral Daily   hydrALAZINE  25 mg Oral Q8H   influenza vac split trivalent PF  0.5 mL Intramuscular Tomorrow-1000   insulin aspart  0-5 Units Subcutaneous QHS   insulin aspart  0-6 Units Subcutaneous TID WC   insulin glargine-yfgn  5 Units Subcutaneous Daily    levETIRAcetam  1,500 mg Oral BID   liver oil-zinc oxide   Topical BID   losartan  50 mg Oral Daily   multivitamin  1 tablet Oral QHS   ondansetron (ZOFRAN) IV  4 mg Intravenous Q12H   pantoprazole  40 mg Oral BID   PARoxetine  10 mg Oral Daily   sodium chloride flush  3 mL Intravenous Q12H   terazosin  1 mg Oral QHS   Vitamin D (Ergocalciferol)  50,000 Units Oral Q7 days    Dialysis Orders: [facility]  4hrs, BFR 500, DFR AF 1.5,  EDW 98.6kg, 3K/ 2.5Ca   Access: TDC  Heparin none Mircera 50 mcg q2wks - not yet given Hectorol IV qHD    Assessment/Plan: Vomiting - Daily for months, G tube malfunction. Per PMD. Imrpoving  ESRD -  On HD TTS. Started during prolonged hospitalization but previously followed for CKD stage 5 planning for HD.  Suspect muscle wasting causing falsely low SCr.  Can  check 24hr urine to confirm but appears to have low UOP per charting.  Next HD 09/21/23. Leukocytosis - WBC  improving today.  ID consulted.  UTI suspected, ABX started.  Blood cultures obtained.  Has sacral decub and TDC as possible infection sources.  Fevers - suspected to be neurogenic in past work up.  Concern for UTI as above.  If ongoing ID recommends repeat MRI brain.  Peg tube malfunction - IR  consulted. Per PMD  Hypertension/volume  - BP in goal. On multiple meds, PMD titrating. Does not appear grossly overloaded.  Pleural effusions noted on CT. Getting under dry weight, likely needs to be lowered.  UF as tolerated.   Anemia of CKD -Hgb 6.7 yesterday, given 1 unit PRBC. Aranesp recently started, continue weekly  Secondary Hyperparathyroidism -  CCa ok. Check phos. Continue home meds.   Nutrition - Regular diet, follow labs. Alb<1.5. Add protein supplements. Hx hemorrhagic stroke - on evidence recurrent bleed on CT.  Hypokalemia -Last K 3.2. Supplements already given. Using higher K bath with HD.   Rogers Blocker, PA-C 09/21/2023, 9:57 AM  [facility] Pager: [phone]

## 2023-09-21 NOTE — Progress Notes (Signed)
Nutrition Follow-up  DOCUMENTATION CODES:   Not applicable  INTERVENTION:  Continue clear liquid diet advance as medically applicable and tolerated.   Continue Boost Breeze po TID while on clear liquid diet, each supplement provides 250 kcal and 9 grams of protein   - Renal MVI daily   - Start Osmolite 1.5 @ 25 ml/hr and advance rate by 10 ml every 6 hours to goal rate of 65 ml/hr (1560 ml/day) - PROSource TF20 60 ml BID   Recommended continuous tube feeding regimen at goal rate would provide 2500 kcal, 138 grams of protein, and 1189 ml of H2O.      Once pt demonstrates tolerance of continuous tube feeds at goal rate, consider transitioning to bolus tube feeding regimen: - 1.5 cartons (355 ml) of Osmolite 1.5 formula QID (6 total cartons daily)   This recommended bolus tube feeding regimen would provide 2130 kcal, 89 grams of protein, and 1086 ml of H2O; this meets 88% of minimum kcal needs and 74% of minimum protein needs.   If pt having issues with hyperkalemia and hyperphosphatemia or volume on Osmolite 1.5 cal formula, recommend trial of Nepro with Carb Steady: - 1 carton (237 ml) of Nepro with Carb Steady formula 5 times daily (5 cartons daily)   This recommended bolus tube feeding regimen would provide 2100 kcal, 95 grams of protein, and 860 ml of H2O; this meets 87% of minimum kcal needs and 79% of minimum protein needs.     NUTRITION DIAGNOSIS:   Inadequate oral intake related to poor appetite, vomiting as evidenced by percent weight loss (15.8% weight loss in 5 months, chronic non-healing pressure injuries).  Continues to be valid addressed through interventions.   GOAL:   Patient will meet greater than or equal to 90% of their needs    MONITOR:   PO intake, Supplement acceptance, Diet advancement, Labs, Weight trends, Skin, I & O's  REASON FOR ASSESSMENT:   Consult Enteral/tube feeding initiation and management  ASSESSMENT:   52 year old male who  presented to the ED on 11/08 with nausea, UTI, clogged G-tube. 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 SLP in room at time of visit. She stated that patient passed MBS in [place] where he was hospitalized from 04/30/2023-07/01/2023 after severe CVA requiring craniotomy. She further reported that to her knowledge he was consuming meals orally and had not been utilizing PEG tub.  Unable to confirm with wife or Patient. Patient not very verbal with RD at time of visit. Wife not in room at this visit Patient stated she would be back in hour, RD attempted second visit with no in room. 11/11- PEG tube replaced  11/12 RD consulted to start feedings  11/12 ST evaluation revealed; exhibited mildly prolonged, but effective mastication with no overt s/sx of aspiration observed. When pt is cleared to resume solids from a medical perspective, recommend initiation of Dysphagia 3 (mech soft) solids and thin liquids with adherence to aspiration precautions.   WEIGHT history: 09/21/23 99.9 kg  04/14/23 111.1 kg    Admit weight: 99.9 kg Current weight: 99.9 kg  Last Weight  Most recent update: 09/21/2023  1:30 PM    Weight  99.9 kg (220 lb 3.8 oz)             Average Meal Intake: 0-75: 25%  intake x 3 recorded meals  Nutritionally Relevant Medications: Scheduled Meds:  amiodarone  100 mg Oral Daily   amLODipine  10 mg Oral Daily   atorvastatin  40 mg Oral QHS   carvedilol  25 mg Oral BID WC   darbepoetin (ARANESP) injection - DIALYSIS  60 mcg Subcutaneous Q Sat-1800   feeding supplement  1 Container Oral TID BM   finasteride  5 mg Oral Daily   hydrALAZINE  25 mg Oral Q8H   liver oil-zinc oxide   Topical BID   losartan  50 mg Oral Daily   multivitamin  1 tablet Oral QHS   ondansetron  (ZOFRAN) IV  4 mg Intravenous Q12H   pantoprazole  40 mg Oral BID   PARoxetine  10 mg Oral Daily   terazosin  1 mg Oral QHS   Vitamin D (Ergocalciferol)  50,000 Units Oral Q7 days    Labs Reviewed: Na-129 Vitamin A, Zinc, C, Labs in process.   NUTRITION - FOCUSED PHYSICAL EXAM:  Flowsheet Row Most Recent Value  Orbital Region No depletion  Upper Arm Region Mild depletion  Thoracic and Lumbar Region No depletion  Buccal Region No depletion  Temple Region No depletion  Clavicle Bone Region No depletion  Clavicle and Acromion Bone Region No depletion  Scapular Bone Region No depletion  Dorsal Hand Unable to assess  Patellar Region No depletion  Anterior Thigh Region No depletion  Posterior Calf Region No depletion  Edema (RD Assessment) None  Hair Reviewed  Eyes Reviewed  Mouth Reviewed  Skin Reviewed  Nails Reviewed       Diet Order:   Diet Order             Diet clear liquid Room service appropriate? Yes; Fluid consistency: Thin  Diet effective now                   EDUCATION NEEDS:   Not appropriate for education at this time  Skin:  Skin Assessment: Skin Integrity Issues: Skin Integrity Issues:: Stage IV, Unstageable, Diabetic Ulcer Stage IV: sacrum Unstageable: R thigh Diabetic Ulcer: R foot, R heel  Last BM:  09/16/23  Height:   Ht Readings from Last 1 Encounters:  09/18/23 6\' 2"  (1.88 m)    Weight:   Wt Readings from Last 1 Encounters:  09/21/23 99.9 kg    Ideal Body Weight:  86.4 kg  BMI:  Body mass index is 28.28 kg/m.  Estimated Nutritional Needs:   Kcal:  2400-2600  Protein:  120-140 grams  Fluid:  >2.2 L    Jamelle Haring RDN, LDN Clinical Dietitian  RDN pager # available on Amion

## 2023-09-21 NOTE — Evaluation (Signed)
Occupational Therapy Evaluation Patient Details Name: Bruce Little MRN: [id number] DOB: 05/23/1971 Today's Date: 09/21/2023   History of Present Illness Pt is a 52 year old male who presents with intermittent nausea and vomiting, fever, and clogged PEG tube; PEG tube replaced in IR 11/11; stage IV sacral wound as well; He had a prolonged hospitalization in [place] (04/30/2023- 07/01/2023) after severe CVA requiring craniotomy, tracheostomy, PEG.  Following [place] hospitalization, he was [facility] and was there August 22 through August 10, 2023.  Worth mentioning is that he was decannulated on 07/23/2023, and weaned off to room air; from [facility] to SNF, where sacral wound worsened   Clinical Impression   Pt c/o no pain at rest during session, unable to state if he recently had pain meds. Pt states he is from home with wife who cares for Pt, uses hoyer and specialty w/c, max to total A for dressing/bathing/toileting. Pt states he is working with HHPT/HHOT at home and wants to continue. HHOT recommended if wife is able to care for Pt's needs 24/7, Pt close to baseline. Will continue to follow acutely to progress as able, Pt states he has all DME needed at home.      If plan is discharge home, recommend the following: Two people to help with walking and/or transfers;Two people to help with bathing/dressing/bathroom;Assistance with cooking/housework;Assistance with feeding;Direct supervision/assist for medications management;Direct supervision/assist for financial management;Assist for transportation;Help with stairs or ramp for entrance    Functional Status Assessment  Patient has had a recent decline in their functional status and demonstrates the ability to make significant improvements in function in a reasonable and predictable amount of time.  Equipment Recommendations  Other (comment) (TBD)    Recommendations for Other Services       Precautions / Restrictions  Precautions Precautions: Fall;Other (comment) Precaution Comments: Crani with bone flap still out; helmet when OOB; reported dizziness sitting EOB, consider supine and sitting BPs Restrictions Weight Bearing Restrictions: No      Mobility Bed Mobility Overal bed mobility: Needs Assistance Bed Mobility: Supine to Sit, Sit to Supine, Rolling Rolling: Max assist   Supine to sit: +2 for physical assistance, Total assist Sit to supine: +2 for physical assistance, Total assist   General bed mobility comments: Pt max A to roll to L side, max-total A for in/out of bed.    Transfers Overall transfer level: Needs assistance                 General transfer comment: maximove      Balance Overall balance assessment: Needs assistance           Standing balance-Leahy Scale: Zero                             ADL either performed or assessed with clinical judgement   ADL Overall ADL's : Needs assistance/impaired;At baseline Eating/Feeding: Set up;Bed level   Grooming: Minimal assistance;Sitting   Upper Body Bathing: Maximal assistance;Sitting   Lower Body Bathing: Total assistance;Sitting/lateral leans   Upper Body Dressing : Maximal assistance;Sitting   Lower Body Dressing: Total assistance;Sitting/lateral leans   Toilet Transfer: Total assistance   Toileting- Clothing Manipulation and Hygiene: Total assistance         General ADL Comments: total for dressing/bathing/transfers with hoyer, RUE able to complete feeding with set up.     Vision         Perception  Praxis         Pertinent Vitals/Pain Pain Assessment Pain Assessment: No/denies pain     Extremity/Trunk Assessment Upper Extremity Assessment Upper Extremity Assessment: LUE deficits/detail LUE Deficits / Details: L hemi, flaccid, good PROM LUE: Shoulder pain with ROM LUE Coordination: decreased gross motor;decreased fine motor   Lower Extremity Assessment Lower  Extremity Assessment: Defer to PT evaluation RLE Deficits / Details: Able to actively flex/extend hip and knee; limited knee flexion range, full extension knee, extension hip to neutral, and adequate hip flexion for sitting; ankle passive dorsiflexion to neutral LLE Deficits / Details: 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); tending to keep knee in very flexed position; Able to extend knee passivley, however pulls back into flexion with spasm   Cervical / Trunk Assessment Cervical / Trunk Assessment: Other exceptions Cervical / Trunk Exceptions: Stiffness upper and lower trunk; Keeps c-spine in R rotated adn slightly laterally flexed position   Communication Communication Communication: Other (comment) (increased time to answer questions)   Cognition Arousal: Alert Behavior During Therapy: [facility] for tasks assessed/performed, Flat affect Overall Cognitive Status: History of cognitive impairments - at baseline                                 General Comments: Pt states it is october 17th, 2024,     General Comments       Exercises     Shoulder Instructions      Home Living Family/patient expects to be discharged to:: Private residence Living Arrangements: Spouse/significant other Available Help at Discharge: Family Type of Home: House Home Access: Ramped entrance     Home Layout: Two level;Able to live on main level with bedroom/bathroom     Bathroom Shower/Tub: Walk-in shower         Home Equipment: Hospital bed;Shower seat;Other (comment) (hoyer lift, specialty chair)   Additional Comments: Pt lives with wife who assists with all ADLs.      Prior Functioning/Environment Prior Level of Function : Needs assist             Mobility Comments: Pt reports his wife uses the hoyer lift to get OOB to his wheelchair ADLs Comments: max to total for bathing/dressing/transfers, able to self feed with set up        OT Problem List:  Decreased strength;Decreased range of motion;Decreased activity tolerance;Impaired balance (sitting and/or standing);Decreased cognition;Impaired tone;Impaired UE functional use;Pain      OT Treatment/Interventions: Self-care/ADL training;Therapeutic exercise;Energy conservation;Therapeutic activities;Patient/family education    OT Goals(Current goals can be found in the care plan section) Acute Rehab OT Goals Patient Stated Goal: to return home with wife OT Goal Formulation: With patient Time For Goal Achievement: 10/05/23 Potential to Achieve Goals: Fair  OT Frequency: Min 1X/week    Co-evaluation              AM-PAC OT "6 Clicks" Daily Activity     Outcome Measure Help from another person eating meals?: A Little Help from another person taking care of personal grooming?: A Lot Help from another person toileting, which includes using toliet, bedpan, or urinal?: Total Help from another person bathing (including washing, rinsing, drying)?: Total Help from another person to put on and taking off regular upper body clothing?: A Lot Help from another person to put on and taking off regular lower body clothing?: Total 6 Click Score: 10   End of Session Nurse Communication:  Mobility status  Activity Tolerance: Patient tolerated treatment well Patient left: in bed;with call bell/phone within reach;Other (comment) (with wound care)  OT Visit Diagnosis: Other abnormalities of gait and mobility (R26.89);Muscle weakness (generalized) (M62.81);Other symptoms and signs involving cognitive function;Pain;Hemiplegia and hemiparesis Hemiplegia - Right/Left: Left Hemiplegia - dominant/non-dominant: Non-Dominant Pain - part of body:  (sacral wound)                Time: 0981-1914 OT Time Calculation (min): 21 min Charges:  OT General Charges $OT Visit: 1 Visit OT Evaluation $OT Eval Moderate Complexity: 1 [address], OTR/L   Alexis Goodell 09/21/2023, 11:21 AM

## 2023-09-21 NOTE — Progress Notes (Signed)
Speech Language Pathology Treatment: Dysphagia  Patient Details Name: Bruce Little MRN: [id number] DOB: [birth year] Today's Date: 09/21/2023 Time: 7829-5621 SLP Time Calculation (min) (ACUTE ONLY): 16 min  Assessment / Plan / Recommendation Clinical Impression  Pt was seen for skilled ST targeting diet tolerance.  Pt was encountered awake/alert in bed and was agreeable to ST session.  Pt is currently on a clear liquid diet with plan to initiate tube feeds via PEG tube today.  Pt was seen with trials of thin liquid and one small trial of regular solids.  He exhibited mildly prolonged, but effective mastication with no overt s/sx of aspiration observed.  When pt is cleared to resume solids from a medical perspective, recommend initiation of Dysphagia 3 (mech soft) solids and thin liquids with adherence to aspiration precautions.  Until then, recommend continuation of clear liquids.  SLP will continue to f/u to assess diet tolerance and adjust diet as appropriate.     HPI HPI: Pt is a 52 year old male who presents with intermittent nausea and vomiting, fever, and clogged PEG tube. 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  Following [place] hospitalization, [facility] and was there August 22 through August 10, 2023.  Worth mentioning is that he was decannulated on 07/23/2023, and weaned off to room air. With ongoing SLP intervention he was started on p.o. intake along with his tube feeds. OP MBS at [facility] (08/16/23) revealed swallow function WNL with recommendations for regular diet/thin liquids with supervision during PO intake given impulsivity. PMH: DM, PAF on Eliquis in the past, CKD now on dialysis, left BKA.      SLP Plan  Continue with current plan of care      Recommendations for follow up therapy are one component of a multi-disciplinary discharge planning process, led by the attending  physician.  Recommendations may be updated based on patient status, additional functional criteria and insurance authorization.    Recommendations  Diet recommendations: Dysphagia 3 (mechanical soft);Thin liquid Liquids provided via: Cup;Straw Medication Administration: Whole meds with liquid Supervision: Staff to assist with self feeding Compensations: Minimize environmental distractions;Slow rate;Small sips/bites                  Oral care BID     Dysphagia, unspecified (R13.10)     Continue with current plan of care    Eino Farber, M.S., CCC-SLP Acute Rehabilitation Services Office: [phone]  Shanon Rosser [facility]  09/21/2023, 12:28 PM

## 2023-09-22 ENCOUNTER — Institutional Professional Consult (permissible substitution): Payer: BC Managed Care – PPO | Admitting: Neurology

## 2023-09-22 DIAGNOSIS — L89154 Pressure ulcer of sacral region, stage 4: Secondary | ICD-10-CM | POA: Diagnosis not present

## 2023-09-22 DIAGNOSIS — N3000 Acute cystitis without hematuria: Secondary | ICD-10-CM | POA: Diagnosis not present

## 2023-09-22 DIAGNOSIS — E440 Moderate protein-calorie malnutrition: Secondary | ICD-10-CM

## 2023-09-22 DIAGNOSIS — D72829 Elevated white blood cell count, unspecified: Secondary | ICD-10-CM | POA: Diagnosis not present

## 2023-09-22 DIAGNOSIS — I10 Essential (primary) hypertension: Secondary | ICD-10-CM

## 2023-09-22 DIAGNOSIS — N289 Disorder of kidney and ureter, unspecified: Secondary | ICD-10-CM

## 2023-09-22 DIAGNOSIS — I619 Nontraumatic intracerebral hemorrhage, unspecified: Secondary | ICD-10-CM | POA: Diagnosis not present

## 2023-09-22 LAB — COMPREHENSIVE METABOLIC PANEL
ALT: 30 U/L (ref 0–44)
AST: 31 U/L (ref 15–41)
Albumin: <1.5 g/dL — ABNORMAL LOW (ref 3.5–5.0)
Alkaline Phosphatase: 116 U/L (ref 38–126)
Anion gap: 8 (ref 5–15)
BUN: 15 mg/dL (ref 6–20)
CO2: 26 mmol/L (ref 22–32)
Calcium: 7 mg/dL — ABNORMAL LOW (ref 8.9–10.3)
Chloride: 97 mmol/L — ABNORMAL LOW (ref 98–111)
Creatinine, Ser: 1.84 mg/dL — ABNORMAL HIGH (ref 0.61–1.24)
GFR, Estimated: 44 mL/min — ABNORMAL LOW (ref >60)
Glucose, Bld: 339 mg/dL — ABNORMAL HIGH (ref 70–99)
Potassium: 3.9 mmol/L (ref 3.5–5.1)
Sodium: 131 mmol/L — ABNORMAL LOW (ref 135–145)
Total Bilirubin: 0.3 mg/dL (ref <1.2)
Total Protein: 4.5 g/dL — ABNORMAL LOW (ref 6.5–8.1)

## 2023-09-22 LAB — CBC
HCT: 22.3 % — ABNORMAL LOW (ref 39.0–52.0)
Hemoglobin: 7.4 g/dL — ABNORMAL LOW (ref 13.0–17.0)
MCH: 31.9 pg (ref 26.0–34.0)
MCHC: 33.2 g/dL (ref 30.0–36.0)
MCV: 96.1 fL (ref 80.0–100.0)
Platelets: 196 10*3/uL (ref 150–400)
RBC: 2.32 MIL/uL — ABNORMAL LOW (ref 4.22–5.81)
RDW: 15.2 % (ref 11.5–15.5)
WBC: 10.8 10*3/uL — ABNORMAL HIGH (ref 4.0–10.5)
nRBC: 0 % (ref 0.0–0.2)

## 2023-09-22 LAB — PHOSPHORUS: Phosphorus: 1.5 mg/dL — ABNORMAL LOW (ref 2.5–4.6)

## 2023-09-22 LAB — GLUCOSE, CAPILLARY
Glucose-Capillary: 156 mg/dL — ABNORMAL HIGH (ref 70–99)
Glucose-Capillary: 177 mg/dL — ABNORMAL HIGH (ref 70–99)
Glucose-Capillary: 199 mg/dL — ABNORMAL HIGH (ref 70–99)
Glucose-Capillary: 272 mg/dL — ABNORMAL HIGH (ref 70–99)
Glucose-Capillary: 325 mg/dL — ABNORMAL HIGH (ref 70–99)
Glucose-Capillary: 338 mg/dL — ABNORMAL HIGH (ref 70–99)

## 2023-09-22 LAB — CULTURE, BLOOD (ROUTINE X 2)
Culture: NO GROWTH
Culture: NO GROWTH
Special Requests: ADEQUATE

## 2023-09-22 LAB — VITAMIN C: Vitamin C: <0.1 mg/dL — ABNORMAL LOW (ref 0.4–2.0)

## 2023-09-22 LAB — ZINC: Zinc: 49 ug/dL (ref 44–115)

## 2023-09-22 LAB — MAGNESIUM: Magnesium: 1.4 mg/dL — ABNORMAL LOW (ref 1.7–2.4)

## 2023-09-22 LAB — VITAMIN A: Vitamin A (Retinoic Acid): 14.3 ug/dL — ABNORMAL LOW (ref 20.1–62.0)

## 2023-09-22 MED ORDER — MEDIHONEY WOUND/BURN DRESSING EX PSTE
1.0000 | PASTE | Freq: Every day | CUTANEOUS | Status: DC
  Administered 2023-09-23 – 2023-09-27 (×6): 1 via TOPICAL
  Filled 2023-09-22 (×2): qty 44

## 2023-09-22 MED ORDER — CHLORHEXIDINE GLUCONATE CLOTH 2 % EX PADS
6.0000 | MEDICATED_PAD | Freq: Every day | CUTANEOUS | Status: DC
  Administered 2023-09-23 – 2023-10-01 (×9): 6 via TOPICAL

## 2023-09-22 NOTE — TOC CM/SW Note (Signed)
Transition of Care [facility]) - Inpatient Brief Assessment   Patient Details  Name: ABED CROXFORD MRN: [id number] Date of Birth: March 01, 1971  [facility] of [facility]) CM/SW Contact:    Tom-Johnson, Hershal Coria, RN Phone Number: 09/22/2023, 11:19 AM   Clinical Narrative:  Patient presented to the ED with episodes of Vomiting and poor Oral intake. Patient had a recent Hemorrhagic Stroke in June on vacation in [place]. Underwent Craniotomy, Peg tube placement and Tracheostomy. Janina Mayo was reversed. Patient was transferred to [facility] in August and went to [facility] prior to going home.  WOC following for sacral wound.   From home with wife and two out of five children. Wife is patient's primary caregiver and transports patient to and from his Outpatient Dialysis appointment on TTS schedule. Has a w/c, hospital bed, hoyer lift, bsc, head helmet.  Started Peg tube feedings, will f/u with order for home use at discharge.    PCP is Westley Hummer, MD and uses [facility] on [address]   Patient active with home health services with [facility], resumption of care order placed and referral called in to [facility] with acceptance noted, info on AVS.  Patient not Medically ready for discharge.  CM will continue to follow as patient progresses with care towards discharge.             Transition of Care Asessment:

## 2023-09-22 NOTE — H&P (Addendum)
   IR Note  Request made per Nephrology NP for tunneled HD cath placement. She felt this catheter was a non tunneled catheter.  I have seen pt and examined existing catheter Wife is in room and answers all questions along with pt Catheter was placed in [facility] on May 04, 2023  Existing catheter is tunneled catheter Functional and without issues per pt and wife Last dialysis yesterday and completed without issues  Site is clean and dry NT no bleeding No sign of infection  No IR need at this time Pt is known to IR--- G tube replacement 09/20/23  If any issues were to arise with this functional tunneled dialysis catheter---  Don't hesitate to reach out to IR

## 2023-09-22 NOTE — Plan of Care (Signed)

## 2023-09-22 NOTE — Progress Notes (Signed)
Speech Language Pathology Treatment: Dysphagia  Patient Details Name: Bruce Little MRN: [id number] DOB: April 25, 1971 Today's Date: 09/22/2023 Time: [id number] SLP Time Calculation (min) (ACUTE ONLY): 15 min  Assessment / Plan / Recommendation Clinical Impression  Patient seen by SLP for skilled treatment focused on dysphagia goals. He was awake and alert and spouse was in room as well. SLP discussed patient's current and recent swallow function.  Spouse reported that she would notice coughing when he was drinking thin liquids but not with nectar thick liquids. In addition, she reports that coughing at times induced patient to vomit and/or regurgitate foods. SLP reviewed MBS completed at a different hospital in October 2024 which reported penetration but no aspiration of thin liquids, no penetration or aspiration with nectar, honey thick liquids or puree solids. SLP observed patient with PO intake of thin liquids and mechanical soft solids. Mastication was mildly prolonged but effective. He exhibited fairly consistent, immediate cough response with thin liquids despite changing from large sips to more controlled sip size. SLP recommending advance solids to mechanical soft (Dys 3) but to downgrade liquids to nectar thick. SLP also recommends allowing patient to have thin liquids in between meals. SLP will continue to follow for toleration of diet, indication of need for MBS, readiness to advance.     HPI HPI: Pt is a 52 year old male who presents with intermittent nausea and vomiting, fever, and clogged PEG tube. 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  Following [place] hospitalization, [facility] and was there August 22 through August 10, 2023.  Worth mentioning is that he was decannulated on 07/23/2023, and weaned off to room air. With ongoing SLP intervention he was started on p.o. intake along  with his tube feeds. OP MBS at [facility] (08/16/23) revealed swallow function WNL with recommendations for regular diet/thin liquids with supervision during PO intake given impulsivity. PMH: DM, PAF on Eliquis in the past, CKD now on dialysis, left BKA.      SLP Plan  Continue with current plan of care      Recommendations for follow up therapy are one component of a multi-disciplinary discharge planning process, led by the attending physician.  Recommendations may be updated based on patient status, additional functional criteria and insurance authorization.    Recommendations  Diet recommendations: Dysphagia 3 (mechanical soft);Nectar-thick liquid Liquids provided via: Cup;Straw Medication Administration: Whole meds with liquid Supervision: Staff to assist with self feeding Compensations: Minimize environmental distractions;Slow rate;Small sips/bites Postural Changes and/or Swallow Maneuvers: Seated upright 90 degrees                  Oral care BID   Set up Supervision/Assistance Dysphagia, unspecified (R13.10)     Continue with current plan of care    Angela Nevin, MA, CCC-SLP Speech Therapy

## 2023-09-22 NOTE — Progress Notes (Signed)
Pt receives out-pt HD at [place] on TTS. Will assist as needed.   Melven Sartorius Renal Navigator [phone]

## 2023-09-22 NOTE — Consult Note (Addendum)
WOC Nurse Consult Note:  Reason for Consult: Surgical team following for assessment and plan of care.  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 But the seal not working for so long because of the area, so close to the anus, and soft stool.  Notice a different wound in the left side of the bullocks. Pressure Injury POA: Yes Wound bed: Yellow covering 100%.  Measurement: 5x5. Dressing procedure/placement/frequency: Medihoney into the wound and a foam cover. Change everyday.  The wife wants to try again if the stool was not too soft to compromised the seal, prior to discharge.  Please re-consult WOC team if you want to apply again.  Thank-you,  Cammie Mcgee MSN, RN, [place], [place], CNS [phone]  [facility] Acquanetta Sit RN,BSN, WOC nurse

## 2023-09-22 NOTE — Progress Notes (Signed)
[facility] Progress Note   Subjective:   Tolerated HD yesterday with net UF 2L. He denies SOB, CP, dizziness. Wife reports his catheter seems to be bothering him because it is pulling on his neck. Wife reports his catheter was put in in June in an different hospital.  Objective Vitals:   09/22/23 0405 09/22/23 0500 09/22/23 0517 09/22/23 0730  BP: 137/74  137/74 (!) 145/76  Pulse: 79   79  Resp:    18  Temp: 98 F (36.7 C)   97.7 F (36.5 C)  TempSrc:      SpO2: 97%   98%  Weight:  103.5 kg    Height:       Physical Exam General: Alert male in NAD Heart: RRR, no murmurs, rubs or gallops Lungs: CTA bilaterally Abdomen: Soft, non-distended, +BS Extremities: No edema b/l lower extremities Dialysis Access: HD cath in R neck, does not appear to be tunneled. No redness/drainage/bleeding  Additional Objective Labs: Basic Metabolic Panel: Recent Labs  Lab 09/19/23 0452 09/21/23 0831 09/21/23 1215 09/21/23 1802  NA 131* 133* 129*  --   K 3.2* 3.5 3.7  --   CL 97* 98 96*  --   CO2 27 27 24   --   GLUCOSE 98 124* 238*  --   BUN 5* 15 14  --   CREATININE 1.15 2.63* 2.54*  --   CALCIUM 7.1* 7.5* 7.2*  --   PHOS  --   --  2.1* 1.2*   Liver Function Tests: Recent Labs  Lab 09/17/23 1006 09/19/23 0452 09/21/23 0831 09/21/23 1215  AST 27 18 27   --   ALT 30 21 28   --   ALKPHOS 106 99 117  --   BILITOT 0.8 0.5 0.5  --   PROT 5.4* 4.9* 5.3*  --   ALBUMIN 1.6* <1.5* <1.5* <1.5*   Recent Labs  Lab 09/17/23 1006  LIPASE 43   CBC: Recent Labs  Lab 09/18/23 0058 09/19/23 0452 09/20/23 0858 09/20/23 2004 09/21/23 0831 09/21/23 1215  WBC 30.2* 23.0* 16.5*  --  13.6* 11.3*  NEUTROABS 26.0*  --   --   --   --   --   HGB 7.1* 7.3* 6.7* 7.5* 8.4* 8.2*  HCT 21.9* 21.5* 20.3* 22.6* 25.3* 24.7*  MCV 95.2 93.9 94.9  --  93.0 94.6  PLT 246 236 230  --  251 226   Blood Culture    Component Value Date/Time   SDES BLOOD RIGHT ARM 09/17/2023 2024    SPECREQUEST  09/17/2023 2024    BOTTLES DRAWN AEROBIC AND ANAEROBIC Blood Culture adequate volume   CULT  09/17/2023 2024    NO GROWTH 5 DAYS Performed at [facility], [address], [place], [place] [address]    REPTSTATUS 09/22/2023 FINAL 09/17/2023 2024    Cardiac Enzymes: No results for input(s): "CKTOTAL", "CKMB", "CKMBINDEX", "TROPONINI" in the last 168 hours. CBG: Recent Labs  Lab 09/21/23 1748 09/21/23 2032 09/22/23 0030 09/22/23 0406 09/22/23 0814  GLUCAP 105* 168* 156* 177* 199*   Iron Studies: No results for input(s): "IRON", "TIBC", "TRANSFERRIN", "FERRITIN" in the last 72 hours. @lablastinr3 @ Studies/Results: DG ABDOMEN PEG TUBE LOCATION  Result Date: 09/20/2023 CLINICAL DATA:  [id number] PEG (percutaneous endoscopic gastrostomy) adjustment/replacement/removal (HCC) [id number] EXAM: ABDOMEN - 1 VIEW COMPARISON:  CT abdomen pelvis 09/17/23, x-ray abdomen 08/02/2023 FINDINGS: Gastrostomy tube overlies the gastric lumen with PO contrast partially opacifying the gastric lumen. The bowel gas pattern is normal. No  radio-opaque calculi or other significant radiographic abnormality are seen. IMPRESSION: Gastrostomy tube in appropriate position. Electronically Signed   By: Tish Frederickson M.D.   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 Current gastrostomy has been clogged for several weeks per patient's wife. Request for removal of existing pull through gastrostomy and placement of new balloon retention gastrostomy for ongoing nutritional needs. EXAM: BEDSIDE REMOVAL AND PLACEMENT OF NEW GASTROSTOMY TUBE WITHIN EXISTING TRACT COMPARISON:  None Available. MEDICATIONS: None. CONTRAST:  30 mL gastrografin - administered into the gastric lumen FLUOROSCOPY TIME:  None  COMPLICATIONS: None immediate. PROCEDURE: Informed written consent was obtained from the patient's wife Doanld Ponce after a discussion of the risks, benefits and alternatives to treatment. Questions regarding the procedure were encouraged and answered. A timeout was performed prior to the initiation of the procedure. The upper abdomen and external portion of the existing gastrostomy tube was prepped and draped in the usual sterile fashion, and a sterile drape was applied covering the operative field. Maximum barrier sterile technique with sterile gowns and gloves were used for the procedure. A timeout was performed prior to the initiation of the procedure. The existing gastrostomy tube was removed in tact with gentle traction. Next, a 20 Fr balloon retention Entuit gastrostomy tube was lubricated with viscous lidocaine and placed within the existing tract. Immediate return of blood was noted within the lumen. The balloon was inflated with 20 mL of normal saline, pulled against the anterior inner lumen of the stomach and the external disc cinched to the skin. Both lumens flushed easily with 10 mL normal saline. KUB with gastrografin injection obtained post procedure which shows the gastrostomy tube to be within the gastric lumen. IMPRESSION: Successful removal of existing gastrostomy tube and placement of a new 20-French gastrostomy tube. The gastrostomy tube is ready for immediate use. Performed by Lynnette Caffey, PA-C Electronically Signed   By: Marliss Coots M.D.   On: 09/20/2023 14:45   Medications:  cefTAZidime (FORTAZ)  IV Stopped (09/21/23 1748)   feeding supplement (OSMOLITE 1.5 CAL) 35 mL/hr at 09/22/23 9562    sodium chloride   Intravenous Once   amiodarone  100 mg Oral Daily   amLODipine  10 mg Oral Daily   atorvastatin  40 mg Oral QHS   carvedilol  25 mg Oral BID WC   Chlorhexidine Gluconate Cloth  6 each Topical Q0600   cloNIDine  0.1 mg Oral TID   darbepoetin (ARANESP) injection -  DIALYSIS  60 mcg Subcutaneous Q Sat-1800   feeding supplement  1 Container Oral TID BM   feeding supplement (PROSource TF20)  60 mL Per Tube Daily   finasteride  5 mg Oral Daily   hydrALAZINE  25 mg Oral Q8H   influenza vac split trivalent PF  0.5 mL Intramuscular Tomorrow-1000   insulin aspart  0-5 Units Subcutaneous QHS   insulin aspart  0-6 Units Subcutaneous TID WC   insulin glargine-yfgn  5 Units Subcutaneous Daily   levETIRAcetam  1,500 mg Oral BID   liver oil-zinc oxide   Topical BID   losartan  50 mg Oral Daily   multivitamin  1 tablet Oral QHS   ondansetron (ZOFRAN) IV  4 mg Intravenous Q12H   pantoprazole  40 mg Oral BID   PARoxetine  10 mg Oral Daily  sodium chloride flush  3 mL Intravenous Q12H   terazosin  1 mg Oral QHS   Vitamin D (Ergocalciferol)  50,000 Units Oral Q7 days    Dialysis Orders: [facility]  4[facility], BFR 500, DFR AF 1.5,  EDW 98.6kg, 3K/ 2.5Ca   Access: TDC  Heparin none Mircera 50 mcg q2wks - not yet given Hectorol IV qHD      Assessment/Plan: Vomiting - Daily for months, G tube malfunction. Per PMD. Imrpoving  ESRD -  On HD TTS. Started during prolonged hospitalization but previously followed for CKD stage 5 planning for HD.  Suspect muscle wasting causing falsely low SCr.  Can check 24hr urine to confirm but appears to have low UOP per charting. Catheter does not appear to be tunneled on my exam/RN exam, will consult IR and see if we can get a new TDC. Next HD 09/21/23. Leukocytosis - WBC  improving today.  ID consulted.  UTI suspected, ABX started.  Blood cultures obtained.  Has sacral decub and TDC as possible infection sources.  Fevers - suspected to be neurogenic in past work up.  Concern for UTI as above.  If ongoing ID recommends repeat MRI brain.  Peg tube malfunction - IR  consulted. Per PMD  Hypertension/volume  - BP in goal. On multiple meds, PMD titrating. Does not appear grossly overloaded.  Pleural effusions noted on CT. Variable  weights here. Continue UF with HD as tolerated.   Anemia of CKD -S/p 1 unit PRBC. Aranesp recently started, continue weekly  Secondary Hyperparathyroidism -  CCa ok. Phos 1.2 yesterday, not on a phosphorus binder. Rechecking today and will supplement as needed.   Nutrition - Regular diet, follow labs. Alb<1.5. Add protein supplements. Hx hemorrhagic stroke - on evidence recurrent bleed on CT.  Hypokalemia -Last K 3.7. Supplements already given. Using higher K bath with HD.   Rogers Blocker, PA-C 09/22/2023, 9:08 AM  [facility] Pager: [phone]

## 2023-09-22 NOTE — Progress Notes (Signed)
PROGRESS NOTE    Bruce Little  UJW:[id number] DOB: [birth year] DOA: 09/17/2023 PCP: Westley Hummer, MD   Brief Narrative:  This is a 52 year old male with DM, PAF on Eliquis in the past, CKD now on dialysis, left BKA who comes into the hospital with intermittent nausea and vomiting, fever, and clogged PEG tube.   He has had a prolonged hospitalization at [facility] in [place] in August, admitted there 04/30/2023 and discharged 07/01/2023.  Hospital course reviewed.  He was visiting [place] from [place], was in a hotel when he was found to have altered mental status, vomiting.  He was found to be hypertensive in the ER with a blood pressure of 226/100, and a CT of the head showed 9.2 x 5.5 cm right frontal temporal parenchymal bleed with edema, mass effect and 1.1 cm left midline shift.  He is status post craniectomy and hematoma evacuation and EVD placement.  Hospital course complicated by Staph epidermidis in the CSF 7/17, will repeat growth 7/22 and 7/24.  Eventually EVD was removed, and there were no plans to replace the bone flap and will need artificial plate eventually.  He will developed sacral decubitus ulcer and underwent serial debridements, sacral wounds grew E. coli, Morganella and Enterococcus faecalis and placed of antibiotics for several weeks.  Hospital course was also complicated by C. difficile diarrhea status post full course of vancomycin while being on IV antibiotics also, and in addition, had a PEG and a trach and developed renal failure requiring dialysis.   Following [place] hospitalization, [facility] and [facility] was there August 22 through August 10, 2023.  Worth mentioning is that he was decannulated on 07/23/2023, and weaned off to room air, and also with ongoing SLP he was started on p.o. intake along with his PEG tube.  He has been in rehab through October, but apparently has been home for couple of weeks prior to being  here.  Assessment & Plan:   Principal Problem:   Vomiting Active Problems:   Renal insufficiency   HTN (hypertension)   UTI (urinary tract infection)   Leukocytosis   Sacral decubitus ulcer   ICH (intracerebral hemorrhage) (HCC)  SIRS with fever, leukocytosis  Febrile, leukocytosis with notable source of UTI at intake Complicated by chronic foley (replaced in our ED at intake) Completed ceftriaxone course   Cultures were sent but remain negative Continues to wax and wane but could be secondary to previous intracranial hemorrhage  History of PAF-currently in sinus rhythm.   Continue amiodarone, Coreg No longer on anticoagulation following his ICH in August   Nausea, vomiting, questionably chronic Main problem per wife given.  He is now on scheduled Zofran and this seems resolved.  Tube feeds will be started today and will continue to monitor   History of ICH-closely monitor mental status.  CT scan done on admission does not show any evidence of recurrent bleed.  He has established with local neurosurgery, I do not have direct access to notes but there were plans in place from [facility] hospitalization to eventually have a plate fitted to his cranium   ESRD-nephrology following, getting dialysis while here   Chronic urinary retention-with chronic Foley   Stage IV sacral decubitus ulcer, POA-CT scan this admission showed resorption of the inferior sacrum and coccyx without abscess.  ID consulted as well.  Underwent debridement by general surgery in August, and no further debridements were recommended following his [facility] stay -Surgery consulted, underwent bedside  debridement 11/11.  Wound care following - wound vac likely not going to be continued   Hyponatremia-in the setting of renal disease   Hypokalemia-continue to monitor and replenish as indicated   Vitamin D deficiency-start supplementation   Anemia-of chronic renal disease as well as chronic illness.  Transfuse unit of packed  red blood cells for hemoglobin of 6.7.  Hemoglobin improved appropriately, today CBC pending   Clogged PEG tube-IR consulted, his PEG tube was replaced on 11/11.  RD consult today pending to initiate tube feeds.  Will prefer to do continues rather bolus to minimize risk of nausea and vomiting   Status post trach-now decannulated, on room air   History of seizures-continue Keppra   Essential hypertension-continue antihypertensives as below, blood pressure stable this morning   Type 2 diabetes mellitus A1C 5.1 - resume low dose glargine and follow as PO intake improves   DVT prophylaxis: SCDs Start: 09/17/23 2336 Code Status:   Code Status: Full Code Family Communication: Wife at bedside  Status is: Inpt  Dispo: The patient is from: SNF              Anticipated d/c is to: Same              Anticipated d/c date is: 48-72h              Patient currently NOT medically stable for discharge  Consultants:  Nephrology  Procedures:  None  Antimicrobials:  Completed  Subjective: No acute issues or events overnight patient denies nausea vomiting headache fever chills or chest pain  Objective: Vitals:   09/22/23 0405 09/22/23 0500 09/22/23 0517 09/22/23 0730  BP: 137/74  137/74 (!) 145/76  Pulse: 79   79  Resp:    18  Temp: 98 F (36.7 C)   97.7 F (36.5 C)  TempSrc:      SpO2: 97%   98%  Weight:  103.5 kg    Height:        Intake/Output Summary (Last 24 hours) at 09/22/2023 0741 Last data filed at 09/22/2023 0641 Gross per 24 hour  Intake 870 ml  Output 2950 ml  Net -2080 ml   Filed Weights   09/21/23 1323 09/21/23 1731 09/22/23 0500  Weight: 99.9 kg 97.9 kg 103.5 kg    Examination:  General:  Pleasantly resting in bed, No acute distress. HEENT: Prior surgical scars consistent with history. Neck:  Without mass or deformity. Lungs:  Clear to auscultate bilaterally without rhonchi, wheeze, or rales. Heart:  Regular rate and rhythm.  Without murmurs, rubs, or  gallops. Abdomen:  Soft, nontender, nondistended.  Without guarding or rebound. Extremities: Without cyanosis, clubbing Skin: Stage 4 sacral decubitus ulcer   Data Reviewed: I have personally reviewed following labs and imaging studies  CBC: Recent Labs  Lab 09/18/23 0058 09/19/23 0452 09/20/23 0858 09/20/23 2004 09/21/23 0831 09/21/23 1215  WBC 30.2* 23.0* 16.5*  --  13.6* 11.3*  NEUTROABS 26.0*  --   --   --   --   --   HGB 7.1* 7.3* 6.7* 7.5* 8.4* 8.2*  HCT 21.9* 21.5* 20.3* 22.6* 25.3* 24.7*  MCV 95.2 93.9 94.9  --  93.0 94.6  PLT 246 236 230  --  251 226   Basic Metabolic Panel: Recent Labs  Lab 09/17/23 1006 09/18/23 0058 09/19/23 0452 09/21/23 0831 09/21/23 1215 09/21/23 1802  NA 132* 130* 131* 133* 129*  --   K 3.1* 3.1* 3.2* 3.5 3.7  --  CL 96* 94* 97* 98 96*  --   CO2 27 23 27 27 24   --   GLUCOSE 142* 86 98 124* 238*  --   BUN 9 12 5* 15 14  --   CREATININE 1.69* 1.85* 1.15 2.63* 2.54*  --   CALCIUM 7.8* 7.4* 7.1* 7.5* 7.2*  --   MG  --   --  1.5*  --   --  1.4*  PHOS  --   --   --   --  2.1* 1.2*   GFR: Estimated Creatinine Clearance: 44.1 mL/min (A) (by C-G formula based on SCr of 2.54 mg/dL (H)). Liver Function Tests: Recent Labs  Lab 09/17/23 1006 09/19/23 0452 09/21/23 0831 09/21/23 1215  AST 27 18 27   --   ALT 30 21 28   --   ALKPHOS 106 99 117  --   BILITOT 0.8 0.5 0.5  --   PROT 5.4* 4.9* 5.3*  --   ALBUMIN 1.6* <1.5* <1.5* <1.5*   Recent Labs  Lab 09/17/23 1006  LIPASE 43   No results for input(s): "AMMONIA" in the last 168 hours. Coagulation Profile: Recent Labs  Lab 09/18/23 0058  INR 1.1   Cardiac Enzymes: No results for input(s): "CKTOTAL", "CKMB", "CKMBINDEX", "TROPONINI" in the last 168 hours. BNP (last 3 results) No results for input(s): "PROBNP" in the last 8760 hours. HbA1C: No results for input(s): "HGBA1C" in the last 72 hours. CBG: Recent Labs  Lab 09/21/23 1144 09/21/23 1748 09/21/23 2032 09/22/23 0030  09/22/23 0406  GLUCAP 230* 105* 168* 156* 177*   Lipid Profile: No results for input(s): "CHOL", "HDL", "LDLCALC", "TRIG", "CHOLHDL", "LDLDIRECT" in the last 72 hours. Thyroid Function Tests: No results for input(s): "TSH", "T4TOTAL", "FREET4", "T3FREE", "THYROIDAB" in the last 72 hours. Anemia Panel: No results for input(s): "VITAMINB12", "FOLATE", "FERRITIN", "TIBC", "IRON", "RETICCTPCT" in the last 72 hours. Sepsis Labs: No results for input(s): "PROCALCITON", "LATICACIDVEN" in the last 168 hours.  Recent Results (from the past 240 hour(s))  Urine Culture     Status: Abnormal   Collection Time: 09/17/23  3:18 PM   Specimen: Urine, Catheterized  Result Value Ref Range Status   Specimen Description URINE, CATHETERIZED  Final   Special Requests   Final    NONE Performed at [facility], [address] [place], [place] [address]    Culture 80,000 COLONIES/mL PSEUDOMONAS AERUGINOSA (A)  Final   Report Status 09/20/2023 FINAL  Final   Organism ID, Bacteria PSEUDOMONAS AERUGINOSA (A)  Final      Susceptibility   Pseudomonas aeruginosa - MIC*    CEFTAZIDIME 4 SENSITIVE Sensitive     CIPROFLOXACIN 1 INTERMEDIATE Intermediate     GENTAMICIN <=1 SENSITIVE Sensitive     IMIPENEM 1 SENSITIVE Sensitive     PIP/TAZO 8 SENSITIVE Sensitive ug/mL    * 80,000 COLONIES/mL PSEUDOMONAS AERUGINOSA  Blood culture (routine x 2)     Status: None   Collection Time: 09/17/23  8:15 PM   Specimen: BLOOD RIGHT HAND  Result Value Ref Range Status   Specimen Description BLOOD RIGHT HAND  Final   Special Requests   Final    BOTTLES DRAWN AEROBIC AND ANAEROBIC Blood Culture results may not be optimal due to an inadequate volume of blood received in culture bottles   Culture   Final    NO GROWTH 5 DAYS Performed at [facility], [address] [place], [place] [address]    Report Status 09/22/2023 FINAL  Final  Blood culture (routine x 2)     Status: None   Collection Time: 09/17/23  8:24  PM   Specimen: BLOOD RIGHT ARM  Result Value Ref Range Status   Specimen Description BLOOD RIGHT ARM  Final   Special Requests   Final    BOTTLES DRAWN AEROBIC AND ANAEROBIC Blood Culture adequate volume   Culture   Final    NO GROWTH 5 DAYS Performed at [facility], [address] [place], [place] [address]    Report Status 09/22/2023 FINAL  Final         Radiology Studies: DG ABDOMEN PEG TUBE LOCATION  Result Date: 09/20/2023 CLINICAL DATA:  [id number] PEG (percutaneous endoscopic gastrostomy) adjustment/replacement/removal (HCC) [id number] EXAM: ABDOMEN - 1 VIEW COMPARISON:  CT abdomen pelvis 09/17/23, x-ray abdomen 08/02/2023 FINDINGS: Gastrostomy tube overlies the gastric lumen with PO contrast partially opacifying the gastric lumen. The bowel gas pattern is normal. No radio-opaque calculi or other significant radiographic abnormality are seen. IMPRESSION: Gastrostomy tube in appropriate position. Electronically Signed   By: Tish Frederickson M.D.   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 Current gastrostomy has been clogged for several weeks per patient's wife. Request for removal of existing pull through gastrostomy and placement of new balloon retention gastrostomy for ongoing nutritional needs. EXAM: BEDSIDE REMOVAL AND PLACEMENT OF NEW GASTROSTOMY TUBE WITHIN EXISTING TRACT COMPARISON:  None Available. MEDICATIONS: None. CONTRAST:  30 mL gastrografin - administered into the gastric lumen FLUOROSCOPY TIME:  None COMPLICATIONS: None immediate. PROCEDURE: Informed written consent was obtained from the patient's wife Dvontae Minten after a discussion of the risks, benefits and alternatives to treatment. Questions regarding the procedure were encouraged and answered. A timeout  was performed prior to the initiation of the procedure. The upper abdomen and external portion of the existing gastrostomy tube was prepped and draped in the usual sterile fashion, and a sterile drape was applied covering the operative field. Maximum barrier sterile technique with sterile gowns and gloves were used for the procedure. A timeout was performed prior to the initiation of the procedure. The existing gastrostomy tube was removed in tact with gentle traction. Next, a 20 Fr balloon retention Entuit gastrostomy tube was lubricated with viscous lidocaine and placed within the existing tract. Immediate return of blood was noted within the lumen. The balloon was inflated with 20 mL of normal saline, pulled against the anterior inner lumen of the stomach and the external disc cinched to the skin. Both lumens flushed easily with 10 mL normal saline. KUB with gastrografin injection obtained post procedure which shows the gastrostomy tube to be within the gastric lumen. IMPRESSION: Successful removal of existing gastrostomy tube and placement of a new 20-French gastrostomy tube. The gastrostomy tube is ready for immediate use. Performed by Lynnette Caffey, PA-C Electronically Signed   By: Marliss Coots M.D.   On: 09/20/2023 14:45        Scheduled Meds:  sodium chloride   Intravenous Once   amiodarone  100 mg Oral Daily   amLODipine  10 mg Oral Daily   atorvastatin  40 mg Oral QHS   carvedilol  25 mg Oral BID WC   Chlorhexidine Gluconate Cloth  6 each Topical Q0600   cloNIDine  0.1 mg Oral TID   darbepoetin (ARANESP) injection -  DIALYSIS  60 mcg Subcutaneous Q Sat-1800   feeding supplement  1 Container Oral TID BM   feeding supplement (PROSource TF20)  60 mL Per Tube Daily   finasteride  5 mg Oral Daily   hydrALAZINE  25 mg Oral Q8H   influenza vac split trivalent PF  0.5 mL Intramuscular Tomorrow-1000   insulin aspart  0-5 Units Subcutaneous QHS   insulin aspart  0-6 Units Subcutaneous TID WC    insulin glargine-yfgn  5 Units Subcutaneous Daily   levETIRAcetam  1,500 mg Oral BID   liver oil-zinc oxide   Topical BID   losartan  50 mg Oral Daily   multivitamin  1 tablet Oral QHS   ondansetron (ZOFRAN) IV  4 mg Intravenous Q12H   pantoprazole  40 mg Oral BID   PARoxetine  10 mg Oral Daily   sodium chloride flush  3 mL Intravenous Q12H   terazosin  1 mg Oral QHS   Vitamin D (Ergocalciferol)  50,000 Units Oral Q7 days   Continuous Infusions:  cefTAZidime (FORTAZ)  IV Stopped (09/21/23 1748)   feeding supplement (OSMOLITE 1.5 CAL) 35 mL/hr at 09/22/23 0312     LOS: 5 days    Time spent:    [name], DO [facility]  If 7PM-7AM, please contact night-coverage [email or web address]  09/22/2023, 7:41 AM

## 2023-09-23 DIAGNOSIS — N3000 Acute cystitis without hematuria: Secondary | ICD-10-CM | POA: Diagnosis not present

## 2023-09-23 DIAGNOSIS — I619 Nontraumatic intracerebral hemorrhage, unspecified: Secondary | ICD-10-CM | POA: Diagnosis not present

## 2023-09-23 DIAGNOSIS — L89154 Pressure ulcer of sacral region, stage 4: Secondary | ICD-10-CM | POA: Diagnosis not present

## 2023-09-23 DIAGNOSIS — D72829 Elevated white blood cell count, unspecified: Secondary | ICD-10-CM | POA: Diagnosis not present

## 2023-09-23 LAB — CBC
HCT: 22.1 % — ABNORMAL LOW (ref 39.0–52.0)
Hemoglobin: 7.3 g/dL — ABNORMAL LOW (ref 13.0–17.0)
MCH: 32.2 pg (ref 26.0–34.0)
MCHC: 33 g/dL (ref 30.0–36.0)
MCV: 97.4 fL (ref 80.0–100.0)
Platelets: 189 10*3/uL (ref 150–400)
RBC: 2.27 MIL/uL — ABNORMAL LOW (ref 4.22–5.81)
RDW: 14.8 % (ref 11.5–15.5)
WBC: 12.4 10*3/uL — ABNORMAL HIGH (ref 4.0–10.5)
nRBC: 0 % (ref 0.0–0.2)

## 2023-09-23 LAB — RENAL FUNCTION PANEL
Albumin: <1.5 g/dL — ABNORMAL LOW (ref 3.5–5.0)
Anion gap: 6 (ref 5–15)
BUN: 19 mg/dL (ref 6–20)
CO2: 26 mmol/L (ref 22–32)
Calcium: 7.1 mg/dL — ABNORMAL LOW (ref 8.9–10.3)
Chloride: 98 mmol/L (ref 98–111)
Creatinine, Ser: 2.38 mg/dL — ABNORMAL HIGH (ref 0.61–1.24)
GFR, Estimated: 32 mL/min — ABNORMAL LOW (ref >60)
Glucose, Bld: 272 mg/dL — ABNORMAL HIGH (ref 70–99)
Phosphorus: 1.4 mg/dL — ABNORMAL LOW (ref 2.5–4.6)
Potassium: 4.3 mmol/L (ref 3.5–5.1)
Sodium: 130 mmol/L — ABNORMAL LOW (ref 135–145)

## 2023-09-23 LAB — GLUCOSE, CAPILLARY
Glucose-Capillary: 211 mg/dL — ABNORMAL HIGH (ref 70–99)
Glucose-Capillary: 250 mg/dL — ABNORMAL HIGH (ref 70–99)
Glucose-Capillary: 264 mg/dL — ABNORMAL HIGH (ref 70–99)
Glucose-Capillary: 269 mg/dL — ABNORMAL HIGH (ref 70–99)
Glucose-Capillary: 271 mg/dL — ABNORMAL HIGH (ref 70–99)
Glucose-Capillary: 374 mg/dL — ABNORMAL HIGH (ref 70–99)

## 2023-09-23 LAB — PHOSPHORUS: Phosphorus: 1.5 mg/dL — ABNORMAL LOW (ref 2.5–4.6)

## 2023-09-23 LAB — MAGNESIUM: Magnesium: 1.4 mg/dL — ABNORMAL LOW (ref 1.7–2.4)

## 2023-09-23 MED ORDER — SODIUM PHOSPHATES 45 MMOLE/15ML IV SOLN
15.0000 mmol | Freq: Once | INTRAVENOUS | Status: AC
  Administered 2023-09-23: 15 mmol via INTRAVENOUS
  Filled 2023-09-23: qty 5

## 2023-09-23 MED ORDER — HEPARIN SODIUM (PORCINE) 1000 UNIT/ML IJ SOLN
INTRAMUSCULAR | Status: AC
  Filled 2023-09-23: qty 4

## 2023-09-23 NOTE — Plan of Care (Signed)

## 2023-09-23 NOTE — Progress Notes (Signed)
[facility] Progress Note   Subjective:   Pt seen on HD, alert and denies any new concerns. Yesterday he reported AVF was irritating but today he says it is not bothering him, IR reports it is sufficiently tunneled and functional.   Objective Vitals:   09/23/23 0857 09/23/23 0900 09/23/23 0915 09/23/23 0930  BP: 135/74 135/74    Pulse: 73 72 73 71  Resp: (!) 22 20 (!) 23 19  Temp:      TempSrc:      SpO2: 99% 99% 97% 98%  Weight:      Height:       Physical Exam General: Alert male in NAD Heart: RRR, no murmurs, rubs or gallops Lungs: CTA bilaterally Abdomen: Soft, non-distended, +BS Extremities: No edema b/l lower extremities Dialysis Access: [facility] accessed on HD  Additional Objective Labs: Basic Metabolic Panel: Recent Labs  Lab 09/21/23 0831 09/21/23 1215 09/21/23 1215 09/21/23 1802 09/22/23 1631 09/23/23 0600  NA 133* 129*  --   --  131*  --   K 3.5 3.7  --   --  3.9  --   CL 98 96*  --   --  97*  --   CO2 27 24  --   --  26  --   GLUCOSE 124* 238*  --   --  339*  --   BUN 15 14  --   --  15  --   CREATININE 2.63* 2.54*  --   --  1.84*  --   CALCIUM 7.5* 7.2*  --   --  7.0*  --   PHOS  --  2.1*   < > 1.2* 1.5* 1.5*   < > = values in this interval not displayed.   Liver Function Tests: Recent Labs  Lab 09/19/23 0452 09/21/23 0831 09/21/23 1215 09/22/23 1631  AST 18 27  --  31  ALT 21 28  --  30  ALKPHOS 99 117  --  116  BILITOT 0.5 0.5  --  0.3  PROT 4.9* 5.3*  --  4.5*  ALBUMIN <1.5* <1.5* <1.5* <1.5*   Recent Labs  Lab 09/17/23 1006  LIPASE 43   CBC: Recent Labs  Lab 09/18/23 0058 09/19/23 0452 09/20/23 0858 09/20/23 2004 09/21/23 0831 09/21/23 1215 09/22/23 1631 09/23/23 0855  WBC 30.2*   < > 16.5*  --  13.6* 11.3* 10.8* 12.4*  NEUTROABS 26.0*  --   --   --   --   --   --   --   HGB 7.1*   < > 6.7*   < > 8.4* 8.2* 7.4* 7.3*  HCT 21.9*   < > 20.3*   < > 25.3* 24.7* 22.3* 22.1*  MCV 95.2   < > 94.9  --  93.0 94.6 96.1  97.4  PLT 246   < > 230  --  251 226 196 189   < > = values in this interval not displayed.   Blood Culture    Component Value Date/Time   SDES BLOOD RIGHT ARM 09/17/2023 2024   SPECREQUEST  09/17/2023 2024    BOTTLES DRAWN AEROBIC AND ANAEROBIC Blood Culture adequate volume   CULT  09/17/2023 2024    NO GROWTH 5 DAYS Performed at [facility], [address] [place], [place] [address]    REPTSTATUS 09/22/2023 FINAL 09/17/2023 2024    Cardiac Enzymes: No results for input(s): "CKTOTAL", "CKMB", "CKMBINDEX", "TROPONINI" in the last 168 hours. CBG:  Recent Labs  Lab 09/22/23 1605 09/22/23 2032 09/23/23 0014 09/23/23 0446 09/23/23 0803  GLUCAP 338* 325* 264* 271* 250*   Iron Studies: No results for input(s): "IRON", "TIBC", "TRANSFERRIN", "FERRITIN" in the last 72 hours. @lablastinr3 @ Studies/Results: No results found. Medications:  cefTAZidime (FORTAZ)  IV Stopped (09/21/23 1748)   feeding supplement (OSMOLITE 1.5 CAL) 65 mL/hr at 09/22/23 2139    sodium chloride   Intravenous Once   amiodarone  100 mg Oral Daily   amLODipine  10 mg Oral Daily   atorvastatin  40 mg Oral QHS   carvedilol  25 mg Oral BID WC   Chlorhexidine Gluconate Cloth  6 each Topical Q0600   Chlorhexidine Gluconate Cloth  6 each Topical Q0600   cloNIDine  0.1 mg Oral TID   darbepoetin (ARANESP) injection - DIALYSIS  60 mcg Subcutaneous Q Sat-1800   feeding supplement  1 Container Oral TID BM   feeding supplement (PROSource TF20)  60 mL Per Tube Daily   finasteride  5 mg Oral Daily   hydrALAZINE  25 mg Oral Q8H   influenza vac split trivalent PF  0.5 mL Intramuscular Tomorrow-1000   insulin aspart  0-5 Units Subcutaneous QHS   insulin aspart  0-6 Units Subcutaneous TID WC   insulin glargine-yfgn  5 Units Subcutaneous Daily   leptospermum manuka honey  1 Application Topical Daily   levETIRAcetam  1,500 mg Oral BID   liver oil-zinc oxide   Topical BID   losartan  50 mg Oral Daily    multivitamin  1 tablet Oral QHS   ondansetron (ZOFRAN) IV  4 mg Intravenous Q12H   pantoprazole  40 mg Oral BID   PARoxetine  10 mg Oral Daily   sodium chloride flush  3 mL Intravenous Q12H   terazosin  1 mg Oral QHS   Vitamin D (Ergocalciferol)  50,000 Units Oral Q7 days    Dialysis Orders: [facility]  4hrs, BFR 500, DFR AF 1.5,  EDW 98.6kg, 3K/ 2.5Ca   Access: TDC  Heparin none Mircera 50 mcg q2wks - not yet given Hectorol IV qHD    Assessment/Plan: Vomiting - Daily for months, G tube malfunction. Per PMD. Imrpoving  ESRD -  On HD TTS. Started during prolonged hospitalization but previously followed for CKD stage 5 planning for HD.  Suspect muscle wasting causing falsely low SCr.  Can check 24hr urine to confirm (still pending), UOP appears to be improved over the past two days.  Leukocytosis - WBC  improving.  ID consulted.  UTI suspected, ABX started.  Blood cultures obtained.  Has sacral decub but not thought to be source of infection  Peg tube malfunction - IR  consulted. Per PMD  Hypertension/volume  - BP in goal. On multiple meds, PMD titrating. Does not appear grossly overloaded.  Pleural effusions noted on CT. Variable weights here. Continue UF with HD as tolerated.   Anemia of CKD -Hgb 7.3. S/p 1 unit PRBC. Aranesp recently started, continue weekly  Secondary Hyperparathyroidism -  CCa ok. Phos in the 1's, will consult pharmacy to help with phos supplement dosing since he is on a dysphagia diet  Nutrition - Regular diet, follow labs. Alb<1.5. Add protein supplements. Hx hemorrhagic stroke - on evidence recurrent bleed on CT.  Hypokalemia -K+ now at goal. Supplements already given. Using higher K bath with HD.   Rogers Blocker, PA-C 09/23/2023, 9:55 AM  [facility] Pager: [phone]

## 2023-09-23 NOTE — Progress Notes (Signed)
SLP Cancellation Note  Patient Details Name: SILUS JACOBY MRN: [id number] DOB: 10-21-1971   Cancelled treatment:       Reason Eval/Treat Not Completed: Patient at procedure or test/unavailable (HD)    Mahala Menghini., M.A. CCC-SLP Acute Rehabilitation Services Office [phone]  Secure chat preferred  09/23/2023, 10:22 AM

## 2023-09-23 NOTE — Progress Notes (Signed)
PROGRESS NOTE    Bruce Little  UJW:[id number] DOB: 1971-10-17 DOA: 09/17/2023 PCP: Westley Hummer, MD   Brief Narrative:  This is a 52 year old male with DM, PAF on Eliquis in the past, CKD now on dialysis, left BKA who comes into the hospital with intermittent nausea and vomiting, fever, and clogged PEG tube.   He has had a prolonged hospitalization at [facility] in [place] in August, admitted there 04/30/2023 and discharged 07/01/2023.  Hospital course reviewed.  He was visiting [place] from [place], was in a hotel when he was found to have altered mental status, vomiting.  He was found to be hypertensive in the ER with a blood pressure of 226/100, and a CT of the head showed 9.2 x 5.5 cm right frontal temporal parenchymal bleed with edema, mass effect and 1.1 cm left midline shift.  He is status post craniectomy and hematoma evacuation and EVD placement.  Hospital course complicated by Staph epidermidis in the CSF 7/17, will repeat growth 7/22 and 7/24.  Eventually EVD was removed, and there were no plans to replace the bone flap and will need artificial plate eventually.  He will developed sacral decubitus ulcer and underwent serial debridements, sacral wounds grew E. coli, Morganella and Enterococcus faecalis and placed of antibiotics for several weeks.  Hospital course was also complicated by C. difficile diarrhea status post full course of vancomycin while being on IV antibiotics also, and in addition, had a PEG and a trach and developed renal failure requiring dialysis.   Following [place] [facility], [facility] and he was there August 22 through August 10, 2023.  Worth mentioning is that he was decannulated on 07/23/2023, and weaned off to room air, and also with ongoing SLP he was started on p.o. intake along with his PEG tube.  He has been in rehab through October, but apparently has been home for few weeks prior to being  here.  Assessment & Plan:   Principal Problem:   Vomiting Active Problems:   Renal insufficiency   HTN (hypertension)   UTI (urinary tract infection)   Leukocytosis   Sacral decubitus ulcer   ICH (intracerebral hemorrhage) (HCC)   Malnutrition of moderate degree   Sepsis, POA Febrile, leukocytosis with notable source of UTI at intake Complicated by chronic foley (replaced in our ED at intake) Completed ceftriaxone course   Cultures were sent but remain negative Continues to wax and wane but could be secondary to previous intracranial hemorrhage  History of PAF-currently in sinus rhythm.   Continue amiodarone, Coreg No longer on anticoagulation following his ICH in August   Nausea, vomiting, questionably chronic Main problem per wife given.  He is now on scheduled Zofran and this seems resolved.  Tube feeds will be started today and will continue to monitor   History of ICH-closely monitor mental status.  CT scan done on admission does not show any evidence of recurrent bleed.  He has established with local neurosurgery, I do not have direct access to notes but there were plans in place from [place] hospitalization to eventually have a plate fitted to his cranium   ESRD-nephrology following, getting dialysis while here   Chronic urinary retention-with chronic Foley   Stage IV sacral decubitus ulcer, POA-CT scan this admission showed resorption of the inferior sacrum and coccyx without abscess.  ID consulted as well.  Underwent debridement by general surgery in August, and no further debridements were recommended following his [facility] stay -Surgery  consulted, underwent bedside debridement 11/11.  Wound care following - wound vac likely not going to be continued given poor adherence -current recommendations for Medihoney, foam cover exchange daily/soiled. Wife would like to attempt woundvac replacement per to discharge if possible -Per discussion with surgery and wound care this is  likely not ideal given location   Hyponatremia-in the setting of renal disease   Hypokalemia-continue to monitor and replenish as indicated   Vitamin D deficiency-start supplementation   Anemia-of chronic renal disease as well as chronic illness.  Transfuse unit of packed red blood cells for hemoglobin of 6.7.  Hemoglobin improved appropriately, today CBC pending   Clogged PEG tube-IR consulted, his PEG tube was replaced on 11/11.  RD consult today pending to initiate tube feeds.  Will prefer to do continues rather bolus to minimize risk of nausea and vomiting   Status post trach-now decannulated, on room air   History of seizures-continue Keppra   Essential hypertension-continue antihypertensives as below, blood pressure stable this morning   Type 2 diabetes mellitus A1C 5.1 - resume low dose glargine and follow as PO intake improves   DVT prophylaxis: SCDs Start: 09/17/23 2336 Code Status:   Code Status: Full Code Family Communication: Wife at bedside  Status is: Inpt  Dispo: The patient is from: SNF              Anticipated d/c is to: Same              Anticipated d/c date is: 48-72h              Patient currently NOT medically stable for discharge  Consultants:  Nephrology  Procedures:  None  Antimicrobials:  Completed  Subjective: No acute issues or events overnight patient denies nausea vomiting headache fever chills or chest pain  Objective: Vitals:   09/22/23 2033 09/23/23 0447 09/23/23 0500 09/23/23 0759  BP: (!) 143/79 137/69  139/72  Pulse: 78 73  72  Resp:    18  Temp: 98.3 F (36.8 C) 98 F (36.7 C)    TempSrc:      SpO2: 97% 98%    Weight:   98 kg   Height:        Intake/Output Summary (Last 24 hours) at 09/23/2023 0805 Last data filed at 09/23/2023 0245 Gross per 24 hour  Intake 2097 ml  Output 1075 ml  Net 1022 ml   Filed Weights   09/21/23 1731 09/22/23 0500 09/23/23 0500  Weight: 97.9 kg 103.5 kg 98 kg     Examination:  General:  Pleasantly resting in bed, No acute distress. HEENT: Prior surgical scars/cranial deformity consistent with history. Neck:  Without mass or deformity. Lungs:  Clear to auscultate bilaterally without rhonchi, wheeze, or rales. Heart:  Regular rate and rhythm.  Without murmurs, rubs, or gallops. Abdomen:  Soft, nontender, nondistended.  Without guarding or rebound. Extremities: Without cyanosis, clubbing Skin: Stage 4 sacral decubitus ulcer   Data Reviewed: I have personally reviewed following labs and imaging studies  CBC: Recent Labs  Lab 09/18/23 0058 09/19/23 0452 09/20/23 0858 09/20/23 2004 09/21/23 0831 09/21/23 1215 09/22/23 1631  WBC 30.2* 23.0* 16.5*  --  13.6* 11.3* 10.8*  NEUTROABS 26.0*  --   --   --   --   --   --   HGB 7.1* 7.3* 6.7* 7.5* 8.4* 8.2* 7.4*  HCT 21.9* 21.5* 20.3* 22.6* 25.3* 24.7* 22.3*  MCV 95.2 93.9 94.9  --  93.0 94.6 96.1  PLT  246 236 230  --  251 226 196   Basic Metabolic Panel: Recent Labs  Lab 09/18/23 0058 09/19/23 0452 09/21/23 0831 09/21/23 1215 09/21/23 1802 09/22/23 1631 09/23/23 0600  NA 130* 131* 133* 129*  --  131*  --   K 3.1* 3.2* 3.5 3.7  --  3.9  --   CL 94* 97* 98 96*  --  97*  --   CO2 23 27 27 24   --  26  --   GLUCOSE 86 98 124* 238*  --  339*  --   BUN 12 5* 15 14  --  15  --   CREATININE 1.85* 1.15 2.63* 2.54*  --  1.84*  --   CALCIUM 7.4* 7.1* 7.5* 7.2*  --  7.0*  --   MG  --  1.5*  --   --  1.4* 1.4* 1.4*  PHOS  --   --   --  2.1* 1.2* 1.5* 1.5*   GFR: Estimated Creatinine Clearance: 55.2 mL/min (A) (by C-G formula based on SCr of 1.84 mg/dL (H)). Liver Function Tests: Recent Labs  Lab 09/17/23 1006 09/19/23 0452 09/21/23 0831 09/21/23 1215 09/22/23 1631  AST 27 18 27   --  31  ALT 30 21 28   --  30  ALKPHOS 106 99 117  --  116  BILITOT 0.8 0.5 0.5  --  0.3  PROT 5.4* 4.9* 5.3*  --  4.5*  ALBUMIN 1.6* <1.5* <1.5* <1.5* <1.5*   Recent Labs  Lab 09/17/23 1006  LIPASE 43    Coagulation Profile: Recent Labs  Lab 09/18/23 0058  INR 1.1   CBG: Recent Labs  Lab 09/22/23 1201 09/22/23 1605 09/22/23 2032 09/23/23 0014 09/23/23 0446  GLUCAP 272* 338* 325* 264* 271*   Recent Results (from the past 240 hour(s))  Urine Culture     Status: Abnormal   Collection Time: 09/17/23  3:18 PM   Specimen: Urine, Catheterized  Result Value Ref Range Status   Specimen Description URINE, CATHETERIZED  Final   Special Requests   Final    NONE Performed at [facility], [address], [place], [place] [address]    Culture 80,000 COLONIES/mL PSEUDOMONAS AERUGINOSA (A)  Final   Report Status 09/20/2023 FINAL  Final   Organism ID, Bacteria PSEUDOMONAS AERUGINOSA (A)  Final      Susceptibility   Pseudomonas aeruginosa - MIC*    CEFTAZIDIME 4 SENSITIVE Sensitive     CIPROFLOXACIN 1 INTERMEDIATE Intermediate     GENTAMICIN <=1 SENSITIVE Sensitive     IMIPENEM 1 SENSITIVE Sensitive     PIP/TAZO 8 SENSITIVE Sensitive ug/mL    * 80,000 COLONIES/mL PSEUDOMONAS AERUGINOSA  Blood culture (routine x 2)     Status: None   Collection Time: 09/17/23  8:15 PM   Specimen: BLOOD RIGHT HAND  Result Value Ref Range Status   Specimen Description BLOOD RIGHT HAND  Final   Special Requests   Final    BOTTLES DRAWN AEROBIC AND ANAEROBIC Blood Culture results may not be optimal due to an inadequate volume of blood received in culture bottles   Culture   Final    NO GROWTH 5 DAYS Performed at [facility], [address] [place], [place] [address]    Report Status 09/22/2023 FINAL  Final  Blood culture (routine x 2)     Status: None   Collection Time: 09/17/23  8:24 PM   Specimen: BLOOD RIGHT ARM  Result Value Ref  Range Status   Specimen Description BLOOD RIGHT ARM  Final   Special Requests   Final    BOTTLES DRAWN AEROBIC AND ANAEROBIC Blood Culture adequate volume   Culture   Final    NO GROWTH 5 DAYS Performed at [facility], [address]  [place], [place] [address]    Report Status 09/22/2023 FINAL  Final         Radiology Studies: No results found.      Scheduled Meds:  sodium chloride   Intravenous Once   amiodarone  100 mg Oral Daily   amLODipine  10 mg Oral Daily   atorvastatin  40 mg Oral QHS   carvedilol  25 mg Oral BID WC   Chlorhexidine Gluconate Cloth  6 each Topical Q0600   Chlorhexidine Gluconate Cloth  6 each Topical Q0600   cloNIDine  0.1 mg Oral TID   darbepoetin (ARANESP) injection - DIALYSIS  60 mcg Subcutaneous Q Sat-1800   feeding supplement  1 Container Oral TID BM   feeding supplement (PROSource TF20)  60 mL Per Tube Daily   finasteride  5 mg Oral Daily   hydrALAZINE  25 mg Oral Q8H   influenza vac split trivalent PF  0.5 mL Intramuscular Tomorrow-1000   insulin aspart  0-5 Units Subcutaneous QHS   insulin aspart  0-6 Units Subcutaneous TID WC   insulin glargine-yfgn  5 Units Subcutaneous Daily   leptospermum manuka honey  1 Application Topical Daily   levETIRAcetam  1,500 mg Oral BID   liver oil-zinc oxide   Topical BID   losartan  50 mg Oral Daily   multivitamin  1 tablet Oral QHS   ondansetron (ZOFRAN) IV  4 mg Intravenous Q12H   pantoprazole  40 mg Oral BID   PARoxetine  10 mg Oral Daily   sodium chloride flush  3 mL Intravenous Q12H   terazosin  1 mg Oral QHS   Vitamin D (Ergocalciferol)  50,000 Units Oral Q7 days   Continuous Infusions:  cefTAZidime (FORTAZ)  IV Stopped (09/21/23 1748)   feeding supplement (OSMOLITE 1.5 CAL) 65 mL/hr at 09/22/23 2139     LOS: 6 days    Time spent:    [name], DO [facility]  If 7PM-7AM, please contact night-coverage [email or web address]  09/23/2023, 8:05 AM

## 2023-09-23 NOTE — Progress Notes (Signed)
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  Following [place] hospitalization, [facility] and was [facility] August 22 through August 10, 2023.  Worth mentioning is that he was decannulated on 07/23/2023, and weaned off to room air; from LTAC to SNF, where sacral wound worsened   OT comments  Pt with wife/son upon entry. Pt lying on L side to relieve pressure off wound, has been switching sides every 2 hours per family and nursing. Pt completed bed level exercises with RUE/RLE. RUE using red theraband and therapist for resistance, overall fair strength. Pt has difficulty with AROM with RLE, AAROM perform for hip flexion, knee flexion/extension, toe/heel raises, IR/ER. Pt educated on managing affected LUE to ensure proper positioning. Pt would benefit from continued skilled therapy, HHOT still recommended upon DC. Pt and family state they have all DME needed at home.       If plan is discharge home, recommend the following:  Two people to help with walking and/or transfers;Two people to help with bathing/dressing/bathroom;Assistance with cooking/housework;Assistance with feeding;Direct supervision/assist for medications management;Direct supervision/assist for financial management;Assist for transportation;Help with stairs or ramp for entrance   Equipment Recommendations  None recommended by OT    Recommendations for Other Services      Precautions / Restrictions Precautions Precautions: Fall;Other (comment) Precaution Comments: Crani with bone flap still out; helmet when OOB;  reported dizziness sitting EOB, consider supine and sitting BPs Restrictions Weight Bearing Restrictions: No       Mobility Bed Mobility Overal bed mobility: Needs Assistance                  Transfers                         Balance                                           ADL either performed or assessed with clinical judgement   ADL Overall ADL's : Needs assistance/impaired;At baseline                                            Extremity/Trunk Assessment Upper Extremity Assessment Upper Extremity Assessment: LUE deficits/detail LUE Deficits / Details: L hemi, flaccid, good PROM LUE Coordination: decreased gross motor;decreased fine motor            Vision       Perception     Praxis      Cognition Arousal: Alert Behavior During Therapy: WFL for tasks assessed/performed, Flat affect Overall Cognitive Status: History of cognitive impairments - at baseline                                 General Comments: able to follow commands and answer questions consistently and fully participate        Exercises  Exercises: General Upper Extremity, General Lower Extremity General Exercises - Upper Extremity Shoulder Flexion: Strengthening, Theraband, 15 reps Theraband Level (Shoulder Flexion): Level 2 (Red) Shoulder Horizontal ADduction: Strengthening, 15 reps, Theraband Theraband Level (Shoulder Horizontal Adduction): Level 2 (Red) Elbow Flexion: Strengthening, 15 reps, Theraband Theraband Level (Elbow Flexion): Level 2 (Red) Elbow Extension: 15 reps, Strengthening, Theraband Theraband Level (Elbow Extension): Level 2 (Red) Digit Composite Flexion: Squeeze ball General Exercises - Lower Extremity Heel Slides: Strengthening, 10 reps, Supine Hip Flexion/Marching: AROM, 10 reps, Supine Toe Raises: 15 reps, Supine, Strengthening Heel Raises: Strengthening, 15 reps, Supine    Shoulder Instructions        General Comments      Pertinent Vitals/ Pain       Pain Assessment Pain Assessment: No/denies pain  Home Living                                          Prior Functioning/Environment              Frequency  Min 1X/week        Progress Toward Goals  OT Goals(current goals can now be found in the care plan section)  Progress towards OT goals: Progressing toward goals  Acute Rehab OT Goals Patient Stated Goal: to return home OT Goal Formulation: With patient/family Time For Goal Achievement: 10/05/23 Potential to Achieve Goals: Fair ADL Goals Additional ADL Goal #1: Pt will be able to complete rolling to L side with min A to decrease need for caregiver support, using bed rails as needed for support. Additional ADL Goal #2: Pt will be able to sit on EOB up to 5 minutes with one hand supported to increase participation in ADLs and overall core strength. Additional ADL Goal #3: Pt will be able to manage affected LUE to maximize safety and prevent awkward and unsafe positioning at rest and during bed mobility/transfers.  Plan      Co-evaluation                 AM-PAC OT "6 Clicks" Daily Activity     Outcome Measure   Help from another person eating meals?: A Little Help from another person taking care of personal grooming?: A Lot Help from another person toileting, which includes using toliet, bedpan, or urinal?: Total Help from another person bathing (including washing, rinsing, drying)?: Total Help from another person to put on and taking off regular upper body clothing?: A Lot Help from another person to put on and taking off regular lower body clothing?: Total 6 Click Score: 10    End of Session    OT Visit Diagnosis: Other abnormalities of gait and mobility (R26.89);Muscle weakness (generalized) (M62.81);Other symptoms and signs involving cognitive function;Pain;Hemiplegia and hemiparesis Hemiplegia - Right/Left: Left Hemiplegia  - dominant/non-dominant: Non-Dominant   Activity Tolerance Patient tolerated treatment well   Patient Left in bed;with call bell/phone within reach;with family/visitor present   Nurse Communication Mobility status        Time: 8413-2440 OT Time Calculation (min): 32 min  Charges: OT General Charges $OT Visit: 1 Visit OT Treatments $Therapeutic Exercise: 23-37 mins  Honestii Marton, OTR/L   Alexis Goodell 09/23/2023, 4:54 PM

## 2023-09-23 NOTE — Procedures (Signed)
HD Note:  Some information was entered later than the data was gathered due to patient care needs. The stated time with the data is accurate.  Received patient in bed to unit.   Alert and oriented.   Informed consent signed and in chart.   Access used: Right upper chest HD catheter Access issues: None  Patient tolerated treatment well.   TX duration: 3 hours  Alert, without acute distress.  Total UF removed: 3000 ml  Hand-off given to patient's nurse.   Transported back to the room   Aubrionna Istre L. Dareen Piano, RN Kidney Dialysis Unit.

## 2023-09-23 NOTE — Procedures (Signed)
HD Note:  Some information was entered later than the data was gathered due to patient care needs. The stated time with the data is accurate.  Received patient in bed to unit.   Alert and oriented.   Informed consent signed and in chart.   Access used: Right upper chest catheter Access issues: None  Patient tolerated treatment well.   TX duration:3 hour  Alert, without acute distress.  Total UF removed: 3000 ml  Hand-off given to patient's nurse.   Transported back to the room   Norlan Rann L. Dareen Piano, RN Kidney Dialysis Unit.

## 2023-09-23 NOTE — Inpatient Diabetes Management (Signed)
Inpatient Diabetes Program Recommendations  AACE/ADA: New Consensus Statement on Inpatient Glycemic Control (2015)  Target Ranges:  Prepandial:   less than 140 mg/dL      Peak postprandial:   less than 180 mg/dL (1-2 hours)      Critically ill patients:  140 - 180 mg/dL   Lab Results  Component Value Date   GLUCAP 250 (H) 09/23/2023   HGBA1C 5.1 09/18/2023    Latest Reference Range & Units 09/22/23 16:05 09/22/23 20:32 09/23/23 00:14 09/23/23 04:46 09/23/23 08:03  Glucose-Capillary 70 - 99 mg/dL 161 (H) 096 (H) 045 (H) 271 (H) 250 (H)  (H): Data is abnormally high Review of Glycemic Control  Diabetes history: type 2 Outpatient Diabetes medications: Lantus 5 units BID, Glucotrol 5 mg daily Current orders for Inpatient glycemic control: Semglee 5 units daily, Novolog 0-6 units correction scale TID, HS scale  Inpatient Diabetes Program Recommendations:   Noted that CBGs have been greater than 200 mg/dl.  Recommend increasing Semglee to 8 units daily, change Novolog 0-6 units correction scale to every 4 hours due to continuous tube feedings.   Smith Mince RN BSN CDE Diabetes Coordinator Pager: [phone]  8am-5pm

## 2023-09-24 DIAGNOSIS — N3000 Acute cystitis without hematuria: Secondary | ICD-10-CM | POA: Diagnosis not present

## 2023-09-24 DIAGNOSIS — D72829 Elevated white blood cell count, unspecified: Secondary | ICD-10-CM | POA: Diagnosis not present

## 2023-09-24 DIAGNOSIS — L89154 Pressure ulcer of sacral region, stage 4: Secondary | ICD-10-CM | POA: Diagnosis not present

## 2023-09-24 DIAGNOSIS — I619 Nontraumatic intracerebral hemorrhage, unspecified: Secondary | ICD-10-CM | POA: Diagnosis not present

## 2023-09-24 LAB — RENAL FUNCTION PANEL
Albumin: <1.5 g/dL — ABNORMAL LOW (ref 3.5–5.0)
Anion gap: 9 (ref 5–15)
BUN: 11 mg/dL (ref 6–20)
CO2: 25 mmol/L (ref 22–32)
Calcium: 7.7 mg/dL — ABNORMAL LOW (ref 8.9–10.3)
Chloride: 98 mmol/L (ref 98–111)
Creatinine, Ser: 1.95 mg/dL — ABNORMAL HIGH (ref 0.61–1.24)
GFR, Estimated: 41 mL/min — ABNORMAL LOW (ref >60)
Glucose, Bld: 134 mg/dL — ABNORMAL HIGH (ref 70–99)
Phosphorus: 2.1 mg/dL — ABNORMAL LOW (ref 2.5–4.6)
Potassium: 4.2 mmol/L (ref 3.5–5.1)
Sodium: 132 mmol/L — ABNORMAL LOW (ref 135–145)

## 2023-09-24 LAB — GLUCOSE, CAPILLARY
Glucose-Capillary: 130 mg/dL — ABNORMAL HIGH (ref 70–99)
Glucose-Capillary: 124 mg/dL — ABNORMAL HIGH (ref 70–99)
Glucose-Capillary: 118 mg/dL — ABNORMAL HIGH (ref 70–99)
Glucose-Capillary: 175 mg/dL — ABNORMAL HIGH (ref 70–99)
Glucose-Capillary: 175 mg/dL — ABNORMAL HIGH (ref 70–99)

## 2023-09-24 MED ORDER — DAKINS (1/4 STRENGTH) 0.125 % EX SOLN
CUTANEOUS | Status: AC
  Filled 2023-09-24: qty 473

## 2023-09-24 MED ORDER — ENSURE ENLIVE PO LIQD
237.0000 mL | Freq: Two times a day (BID) | ORAL | Status: DC
  Administered 2023-09-29 – 2023-10-03 (×6): 237 mL via ORAL

## 2023-09-24 MED ORDER — VITAMIN A 3 MG (10000 UNIT) PO CAPS
50000.0000 [IU] | ORAL_CAPSULE | Freq: Every day | ORAL | Status: DC
  Administered 2023-09-24 – 2023-10-03 (×9): 50000 [IU] via ORAL
  Filled 2023-09-24 (×11): qty 5

## 2023-09-24 MED ORDER — ONDANSETRON HCL 4 MG/2ML IJ SOLN
4.0000 mg | Freq: Four times a day (QID) | INTRAMUSCULAR | Status: DC | PRN
  Administered 2023-09-29 – 2023-10-04 (×5): 4 mg via INTRAVENOUS
  Filled 2023-09-24 (×5): qty 2

## 2023-09-24 MED ORDER — K PHOS MONO-SOD PHOS DI & MONO 155-852-130 MG PO TABS: 250.0000 mg | ORAL_TABLET | Freq: Once | ORAL | Status: DC

## 2023-09-24 MED ORDER — VITAMIN C 500 MG PO TABS
500.0000 mg | ORAL_TABLET | Freq: Two times a day (BID) | ORAL | Status: DC
  Administered 2023-09-24 – 2023-10-04 (×20): 500 mg via ORAL
  Filled 2023-09-24 (×21): qty 1

## 2023-09-24 MED ORDER — JUVEN PO PACK
1.0000 | PACK | Freq: Two times a day (BID) | ORAL | Status: DC
  Administered 2023-09-25 – 2023-10-04 (×14): 1 via ORAL
  Filled 2023-09-24 (×7): qty 1

## 2023-09-24 NOTE — Progress Notes (Signed)
Nutrition Follow-up  DOCUMENTATION CODES:   Non-severe (moderate) malnutrition in context of chronic illness  INTERVENTION:  Continue with current DYS NTL Diet. Ensure Plus High Protein po BID, each supplement provides 350 kcal and 20 grams of protein.(Thickened to nectar thick) 1 packet Juven BID, each packet provides 95 calories, 2.5 grams of protein (collagen), and 9.8 grams of carbohydrate (3 grams sugar); also contains 7 grams of L-arginine and L-glutamine, 300 mg vitamin C, 15 mg vitamin E, 1.2 mcg vitamin B-12, 9.5 mg zinc, 200 mg calcium, and 1.5 g  Calcium Beta-hydroxy-Beta-methylbutyrate to support wound healing. 500 mg vitamin C BID x 30 days 50,000 mcg vitamin A x 2 weeks Continue with renal vitamin Hand out and verbal education provided to care giver     NUTRITION DIAGNOSIS:   Moderate Malnutrition related to chronic illness as evidenced by moderate fat depletion, moderate muscle depletion.    GOAL:   Patient will meet greater than or equal to 90% of their needs    MONITOR:   Supplement acceptance, PO intake, Diet advancement, Labs, Weight trends, TF tolerance, Skin  REASON FOR ASSESSMENT:   Consult Enteral/tube feeding initiation and management (Wound rectum loose stools,)  ASSESSMENT:   52 year old male who presented to the ED on 11/08 with nausea, UTI, clogged G-tube. 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 11/11- PEG tube replaced  11/12 RD consulted to start feedings  11/12 ST evaluation revealed; exhibited mildly prolonged, but effective mastication with no overt s/sx of aspiration observed. When pt is cleared to resume solids from a medical perspective, recommend initiation of Dysphagia 3 (mech soft) solids and thin liquids with adherence  to aspiration precautions.  11/14- Tube feeding discontinued.  Review STG IV rectum, Loose stools, EN support discontinued. HD dependent. Diet advanced. With fair to good intake if wife is there to feed. Abnormal A and C labs.  Currently receives Boost Breeze po TID, each supplement provides 250 kcal and 9 grams of protein. Patient wife in room at time of visit. Patient pretend to be sleeping but wife stated he was listening to our conversation. Wife stated that hey had stopped TF per her request due to loose stools and location of PI. She said he eats well when she is there to feed him but does not eat for staff. He will drink the supplements.  She said she had no knowledge of how what to feed him due to current condition. She also stated that she did not know how to care for his PEG and that is why it got clogged. At home they had not been using the PEG tube all nutrition was being received orally. She wants to keep PEG and take core of it properly to have as a back-up source for him to receive nutrition. Hand outs provide and wen through with.   Hospital weight history:  09/24/23 0500 98.1 kg 216.27 lbs  09/23/23 0500 98 kg 216.05 lbs  09/22/23 0500 103.5 kg 228.18 lbs  09/21/23 1731 97.9 kg 215.83 lbs  09/21/23 1323 99.9 kg 220.24 lbs  09/18/23 0307 93.6 kg 206.35 lbs      Average Meal Intake: 0-100: 40% intake x 7 recorded meals  Nutritionally Relevant Medications: Scheduled Meds:  amiodarone  100 mg Oral Daily   amLODipine  10 mg Oral Daily  atorvastatin  40 mg Oral QHS   carvedilol  25 mg Oral BID WC   darbepoetin (ARANESP) injection - DIALYSIS  60 mcg Subcutaneous Q Sat-1800   feeding supplement  1 Container Oral TID BM   losartan  50 mg Oral Daily   multivitamin  1 tablet Oral QHS   pantoprazole  40 mg Oral BID   phosphorus  250 mg Oral Once   terazosin  1 mg Oral QHS   Vitamin D (Ergocalciferol)  50,000 Units Oral Q7 days     Vitamin labs; Vitamin A; 14.3L Vitamin C;  <0.1L Vitamin D; 28.32 NUTRITION - FOCUSED PHYSICAL EXAM:  Flowsheet Row Most Recent Value  Orbital Region Mild depletion  Upper Arm Region Moderate depletion  Thoracic and Lumbar Region Mild depletion  Buccal Region Mild depletion  Temple Region Mild depletion  Clavicle Bone Region No depletion  Clavicle and Acromion Bone Region Moderate depletion  Scapular Bone Region Moderate depletion  Dorsal Hand Unable to assess  Patellar Region Moderate depletion  Anterior Thigh Region Mild depletion  Posterior Calf Region Mild depletion  Edema (RD Assessment) None  Hair Reviewed  Eyes Reviewed  Mouth Reviewed  Skin Reviewed  Nails Reviewed       Diet Order:   Diet Order             DIET DYS 3 Room service appropriate? Yes with Assist; Fluid consistency: Nectar Thick  Diet effective now                   EDUCATION NEEDS:   Not appropriate for education at this time  Skin:  Skin Assessment: Skin Integrity Issues: Skin Integrity Issues:: Stage IV, Unstageable, Diabetic Ulcer Stage IV: sacrum Unstageable: R thigh Diabetic Ulcer: R foot, R heel  Last BM:  09/16/23  Height:   Ht Readings from Last 1 Encounters:  09/18/23 6\' 2"  (1.88 m)    Weight:   Wt Readings from Last 1 Encounters:  09/24/23 98.1 kg    Ideal Body Weight:  86.4 kg  BMI:  Body mass index is 27.77 kg/m.  Estimated Nutritional Needs:   Kcal:  2400-2600  Protein:  120-140 grams  Fluid:  >2.2 L    Jamelle Haring RDN, LDN Clinical Dietitian  RDN pager # available on Amion

## 2023-09-24 NOTE — Progress Notes (Signed)
Subjective: Seen and examined in room no complaints currently said tolerated dialysis yesterday 3 L UF using TDC  Objective Vital signs in last 24 hours: Vitals:   09/23/23 2025 09/24/23 0429 09/24/23 0500 09/24/23 0837  BP: 136/71 122/68  134/63  Pulse: 78 74  77  Resp: 18 18  18   Temp: 99.5 F (37.5 C) 98.1 F (36.7 C)  98.2 F (36.8 C)  TempSrc:    Oral  SpO2: 98% 98%    Weight:   98.1 kg   Height:       Weight change: 0.1 kg  Physical Exam: General: Alert chronically ill-appearing male with left-sided hemiparesis/NAD Heart: RRR no MRG Lungs: CTA bilaterally Abdomen: NABS, soft NTND feeding tube present dry clear Extremities: Left BKA, right foot dressing dry clear no lower extremity edema appreciated Dialysis Access: Right IJ TDC    OP Dialysis Orders: T[facility] - [facility]  4hrs, BFR 500, DFR AF 1.5,  EDW 98.6kg, 3K/ 2.5Ca   Access: TDC  Heparin none Mircera 50 mcg q2wks - not yet given Hectorol IV qHD     Assessment/Plan: Vomiting - Daily for months, now resolved per patient, G tube malfunction.  Noted IR 11/11 consult "both lumens flush now easily."  Further plans per PMD.  Noted needs feedings albumin less than 1 .5 Hyponatremia=130> 132 today improved after UF on HD  ESRD -  On HD TTS. Started during prolonged hospitalization but previously followed for CKD stage 5 planning for HD.  Suspect muscle wasting causing falsely low SCr.Marland Kitchen  Hyponatremia 3 L UF.   ESRD most likely . Can check 24hr urine to confirm (still pending), UOP appears to be improved over the past two days.  Continue HD 11/16 next  Leukocytosis - WBC  improving.  ID consulted.  UTI suspected, ABX started.  Blood cultures obtained.  Has sacral decub but not thought to be source of infection  Peg tube malfunction - IR  consulted.  As above.  Further plans per PMD  Hypertension/volume  - BP in goal. On multiple meds, can be titrating with HD UF.  Does not appear grossly overloaded.  Pleural effusions noted on  CT. Variable weights here. Continue UF with HD as tolerated.   Anemia of CKD -Hgb 7.3. S/p 1 unit PRBC. Aranesp recently started, continue weekly  Secondary Hyperparathyroidism -  CCa ok. Phos in the 1's improved today 2.1  with pharmacy help supplementing phos dosing.   Nutrition - Regular diet, follow labs. Alb<1.5.  Low phosphorus, K okay add protein supplements. Hx hemorrhagic stroke -left-sided hemiparesis -Rx Per admit team Hypokalemia -K+ now at goal. Supplements already given. Using higher K bath with HD.   Lenny Pastel, PA-C [facility] [phone] 09/24/2023,10:45 AM  LOS: 7 days   Labs: Basic Metabolic Panel: Recent Labs  Lab 09/22/23 1631 09/23/23 0600 09/23/23 0855 09/24/23 0636  NA 131*  --  130* 132*  K 3.9  --  4.3 4.2  CL 97*  --  98 98  CO2 26  --  26 25  GLUCOSE 339*  --  272* 134*  BUN 15  --  19 11  CREATININE 1.84*  --  2.38* 1.95*  CALCIUM 7.0*  --  7.1* 7.7*  PHOS 1.5* 1.5* 1.4* 2.1*   Liver Function Tests: Recent Labs  Lab 09/19/23 0452 09/21/23 0831 09/21/23 1215 09/22/23 1631 09/23/23 0855 09/24/23 0636  AST 18 27  --  31  --   --   ALT  21 28  --  30  --   --   ALKPHOS 99 117  --  116  --   --   BILITOT 0.5 0.5  --  0.3  --   --   PROT 4.9* 5.3*  --  4.5*  --   --   ALBUMIN <1.5* <1.5*   < > <1.5* <1.5* <1.5*   < > = values in this interval not displayed.   No results for input(s): "LIPASE", "AMYLASE" in the last 168 hours. No results for input(s): "AMMONIA" in the last 168 hours. CBC: Recent Labs  Lab 09/18/23 0058 09/19/23 0452 09/20/23 0858 09/20/23 2004 09/21/23 0831 09/21/23 1215 09/22/23 1631 09/23/23 0855  WBC 30.2*   < > 16.5*  --  13.6* 11.3* 10.8* 12.4*  NEUTROABS 26.0*  --   --   --   --   --   --   --   HGB 7.1*   < > 6.7*   < > 8.4* 8.2* 7.4* 7.3*  HCT 21.9*   < > 20.3*   < > 25.3* 24.7* 22.3* 22.1*  MCV 95.2   < > 94.9  --  93.0 94.6 96.1 97.4  PLT 246   < > 230  --  251 226 196 189   < > =  values in this interval not displayed.   Cardiac Enzymes: No results for input(s): "CKTOTAL", "CKMB", "CKMBINDEX", "TROPONINI" in the last 168 hours. CBG: Recent Labs  Lab 09/23/23 1248 09/23/23 1637 09/23/23 2029 09/24/23 0431 09/24/23 0734  GLUCAP 211* 374* 269* 130* 124*    Studies/Results: No results found. Medications:  cefTAZidime (FORTAZ)  IV 1 g (09/23/23 2123)    amiodarone  100 mg Oral Daily   amLODipine  10 mg Oral Daily   atorvastatin  40 mg Oral QHS   carvedilol  25 mg Oral BID WC   Chlorhexidine Gluconate Cloth  6 each Topical Q0600   Chlorhexidine Gluconate Cloth  6 each Topical Q0600   cloNIDine  0.1 mg Oral TID   darbepoetin (ARANESP) injection - DIALYSIS  60 mcg Subcutaneous Q Sat-1800   feeding supplement  1 Container Oral TID BM   finasteride  5 mg Oral Daily   hydrALAZINE  25 mg Oral Q8H   influenza vac split trivalent PF  0.5 mL Intramuscular Tomorrow-1000   insulin aspart  0-5 Units Subcutaneous QHS   insulin aspart  0-6 Units Subcutaneous TID WC   insulin glargine-yfgn  5 Units Subcutaneous Daily   leptospermum manuka honey  1 Application Topical Daily   levETIRAcetam  1,500 mg Oral BID   liver oil-zinc oxide   Topical BID   losartan  50 mg Oral Daily   multivitamin  1 tablet Oral QHS   ondansetron (ZOFRAN) IV  4 mg Intravenous Q12H   pantoprazole  40 mg Oral BID   PARoxetine  10 mg Oral Daily   phosphorus  250 mg Oral Once   sodium chloride flush  3 mL Intravenous Q12H   terazosin  1 mg Oral QHS   Vitamin D (Ergocalciferol)  50,000 Units Oral Q7 days

## 2023-09-24 NOTE — Progress Notes (Signed)
PROGRESS NOTE    Bruce Little  AOZ:[id number] DOB: [birth year] DOA: 09/17/2023 PCP: Westley Hummer, MD   Brief Narrative:  This is a 52 year old male with DM, PAF on Eliquis in the past, CKD now on dialysis, left BKA who comes into the hospital with intermittent nausea and vomiting, fever, and clogged PEG tube.   He has had a prolonged hospitalization at [facility] in [place] in August, admitted there 04/30/2023 and discharged 07/01/2023.  Hospital course reviewed.  He was visiting [place] from [place], was in a hotel when he was found to have altered mental status, vomiting.  He was found to be hypertensive in the ER with a blood pressure of 226/100, and a CT of the head showed 9.2 x 5.5 cm right frontal temporal parenchymal bleed with edema, mass effect and 1.1 cm left midline shift.  He is status post craniectomy and hematoma evacuation and EVD placement.  Hospital course complicated by Staph epidermidis in the CSF 7/17, will repeat growth 7/22 and 7/24.  Eventually EVD was removed, and there were no plans to replace the bone flap and will need artificial plate eventually.  He will developed sacral decubitus ulcer and underwent serial debridements, sacral wounds grew E. coli, Morganella and Enterococcus faecalis and placed of antibiotics for several weeks.  Hospital course was also complicated by C. difficile diarrhea status post full course of vancomycin while being on IV antibiotics also, and in addition, had a PEG and a trach and developed renal failure requiring dialysis.   Following [place] hospitalization, [facility] and [facility] was there August 22 through August 10, 2023.  Worth mentioning is that he was decannulated on 07/23/2023, and weaned off to room air, and also with ongoing SLP he was started on p.o. intake along with his PEG tube.  He has been in rehab through October, but apparently has been home for few weeks prior to being  here.  Assessment & Plan:   Principal Problem:   Vomiting Active Problems:   Renal insufficiency   HTN (hypertension)   UTI (urinary tract infection)   Leukocytosis   Sacral decubitus ulcer   ICH (intracerebral hemorrhage) (HCC)   Malnutrition of moderate degree  Sepsis, POA Febrile, leukocytosis with notable source of UTI at intake Complicated by chronic foley (replaced in our ED at intake) Completed ceftriaxone course   Cultures were sent but remain negative Continues to wax and wane but could be secondary to previous intracranial hemorrhage  History of PAF-currently in sinus rhythm.   Continue amiodarone, Coreg No longer on anticoagulation following his ICH in August   Nausea, vomiting, questionably chronic -Recurrent episodes this morning, transition to as needed Zofran every 6(previously scheduled twice daily) given poor symptom control -Hold tube feeds unless absolutely necessary given ongoing diarrhea and recurrent vomiting questionably secondary to intolerance   History of ICH-closely monitor mental status.  CT scan done on admission does not show any evidence of recurrent bleed.  He has established with local neurosurgery, I do not have direct access to notes but there were plans in place from [facility] hospitalization to eventually have a plate fitted to his cranium   ESRD-nephrology following, getting dialysis while here   Chronic urinary retention-with chronic Foley   Stage IV sacral decubitus ulcer, POA-CT scan this admission showed resorption of the inferior sacrum and coccyx without abscess.  ID consulted as well.  Underwent debridement by general surgery in August, and no further debridements were recommended  following his [facility] stay -Surgery consulted, underwent bedside debridement 11/11.  Wound care following - wound vac likely not going to be continued given poor adherence -current recommendations for Medihoney, foam cover exchange daily/soiled. Wife would like to  attempt woundvac replacement per to discharge if possible -Per discussion with surgery and wound care this is likely not ideal given location   Hyponatremia-in the setting of renal disease   Hypokalemia-continue to monitor and replenish as indicated   Vitamin D deficiency-start supplementation   Anemia-of chronic renal disease as well as chronic illness.  Transfuse unit of packed red blood cells for hemoglobin of 6.7.  Hemoglobin improved appropriately, today CBC pending   Clogged PEG tube-IR consulted, his PEG tube was replaced on 11/11.  RD consult today pending to initiate tube feeds.  Will prefer to do continues rather bolus to minimize risk of nausea and vomiting   Status post trach-now decannulated, on room air   History of seizures-continue Keppra   Essential hypertension-continue antihypertensives as below, blood pressure stable this morning   Type 2 diabetes mellitus A1C 5.1 - resume low dose glargine and follow as PO intake improves   DVT prophylaxis: SCDs Start: 09/17/23 2336 Code Status:   Code Status: Full Code Family Communication: Wife at bedside  Status is: Inpt  Dispo: The patient is from: SNF              Anticipated d/c is to: Same              Anticipated d/c date is: 48-72h              Patient currently NOT medically stable for discharge  Consultants:  Nephrology  Procedures:  None  Antimicrobials:  Completed  Subjective: No acute issues or events overnight patient denies nausea vomiting headache fever chills or chest pain  Objective: Vitals:   09/23/23 1634 09/23/23 2025 09/24/23 0429 09/24/23 0500  BP: (!) 131/90 136/71 122/68   Pulse: 81 78 74   Resp: 17 18 18    Temp: 98.6 F (37 C) 99.5 F (37.5 C) 98.1 F (36.7 C)   TempSrc:      SpO2: 99% 98% 98%   Weight:    98.1 kg  Height:        Intake/Output Summary (Last 24 hours) at 09/24/2023 0837 Last data filed at 09/24/2023 1191 Gross per 24 hour  Intake 1281.54 ml  Output 6750 ml   Net -5468.46 ml   Filed Weights   09/22/23 0500 09/23/23 0500 09/24/23 0500  Weight: 103.5 kg 98 kg 98.1 kg    Examination:  General:  Pleasantly resting in bed, No acute distress. HEENT: Prior surgical scars/cranial deformity consistent with history. Neck:  Without mass or deformity. Lungs:  Clear to auscultate bilaterally without rhonchi, wheeze, or rales. Heart:  Regular rate and rhythm.  Without murmurs, rubs, or gallops. Abdomen:  Soft, nontender, nondistended.  Without guarding or rebound. Extremities: Without cyanosis, clubbing Skin: Stage 4 sacral decubitus ulcer   Data Reviewed: I have personally reviewed following labs and imaging studies  CBC: Recent Labs  Lab 09/18/23 0058 09/19/23 0452 09/20/23 0858 09/20/23 2004 09/21/23 0831 09/21/23 1215 09/22/23 1631 09/23/23 0855  WBC 30.2*   < > 16.5*  --  13.6* 11.3* 10.8* 12.4*  NEUTROABS 26.0*  --   --   --   --   --   --   --   HGB 7.1*   < > 6.7* 7.5* 8.4* 8.2* 7.4* 7.3*  HCT 21.9*   < > 20.3* 22.6* 25.3* 24.7* 22.3* 22.1*  MCV 95.2   < > 94.9  --  93.0 94.6 96.1 97.4  PLT 246   < > 230  --  251 226 196 189   < > = values in this interval not displayed.   Basic Metabolic Panel: Recent Labs  Lab 09/19/23 0452 09/21/23 0831 09/21/23 1215 09/21/23 1215 09/21/23 1802 09/22/23 1631 09/23/23 0600 09/23/23 0855 09/24/23 0636  NA 131* 133* 129*  --   --  131*  --  130* 132*  K 3.2* 3.5 3.7  --   --  3.9  --  4.3 4.2  CL 97* 98 96*  --   --  97*  --  98 98  CO2 27 27 24   --   --  26  --  26 25  GLUCOSE 98 124* 238*  --   --  339*  --  272* 134*  BUN 5* 15 14  --   --  15  --  19 11  CREATININE 1.15 2.63* 2.54*  --   --  1.84*  --  2.38* 1.95*  CALCIUM 7.1* 7.5* 7.2*  --   --  7.0*  --  7.1* 7.7*  MG 1.5*  --   --   --  1.4* 1.4* 1.4*  --   --   PHOS  --   --  2.1*   < > 1.2* 1.5* 1.5* 1.4* 2.1*   < > = values in this interval not displayed.   GFR: Estimated Creatinine Clearance: 52.1 mL/min (A) (by C-G  formula based on SCr of 1.95 mg/dL (H)). Liver Function Tests: Recent Labs  Lab 09/17/23 1006 09/19/23 0452 09/21/23 0831 09/21/23 1215 09/22/23 1631 09/23/23 0855 09/24/23 0636  AST 27 18 27   --  31  --   --   ALT 30 21 28   --  30  --   --   ALKPHOS 106 99 117  --  116  --   --   BILITOT 0.8 0.5 0.5  --  0.3  --   --   PROT 5.4* 4.9* 5.3*  --  4.5*  --   --   ALBUMIN 1.6* <1.5* <1.5* <1.5* <1.5* <1.5* <1.5*   Recent Labs  Lab 09/17/23 1006  LIPASE 43   Coagulation Profile: Recent Labs  Lab 09/18/23 0058  INR 1.1   CBG: Recent Labs  Lab 09/23/23 1248 09/23/23 1637 09/23/23 2029 09/24/23 0431 09/24/23 0734  GLUCAP 211* 374* 269* 130* 124*   Recent Results (from the past 240 hour(s))  Urine Culture     Status: Abnormal   Collection Time: 09/17/23  3:18 PM   Specimen: Urine, Catheterized  Result Value Ref Range Status   Specimen Description URINE, CATHETERIZED  Final   Special Requests   Final    NONE Performed at [facility], [address], [place], [place] [address]    Culture 80,000 COLONIES/mL PSEUDOMONAS AERUGINOSA (A)  Final   Report Status 09/20/2023 FINAL  Final   Organism ID, Bacteria PSEUDOMONAS AERUGINOSA (A)  Final      Susceptibility   Pseudomonas aeruginosa - MIC*    CEFTAZIDIME 4 SENSITIVE Sensitive     CIPROFLOXACIN 1 INTERMEDIATE Intermediate     GENTAMICIN <=1 SENSITIVE Sensitive     IMIPENEM 1 SENSITIVE Sensitive     PIP/TAZO 8 SENSITIVE Sensitive ug/mL    * 80,000 COLONIES/mL PSEUDOMONAS AERUGINOSA  Blood culture (  routine x 2)     Status: None   Collection Time: 09/17/23  8:15 PM   Specimen: BLOOD RIGHT HAND  Result Value Ref Range Status   Specimen Description BLOOD RIGHT HAND  Final   Special Requests   Final    BOTTLES DRAWN AEROBIC AND ANAEROBIC Blood Culture results may not be optimal due to an inadequate volume of blood received in culture bottles   Culture   Final    NO GROWTH 5 DAYS Performed at [facility]  Lab, [address] [place], [place] [address]    Report Status 09/22/2023 FINAL  Final  Blood culture (routine x 2)     Status: None   Collection Time: 09/17/23  8:24 PM   Specimen: BLOOD RIGHT ARM  Result Value Ref Range Status   Specimen Description BLOOD RIGHT ARM  Final   Special Requests   Final    BOTTLES DRAWN AEROBIC AND ANAEROBIC Blood Culture adequate volume   Culture   Final    NO GROWTH 5 DAYS Performed at [facility], [address] [place], [place] [address]    Report Status 09/22/2023 FINAL  Final         Radiology Studies: No results found.      Scheduled Meds:  amiodarone  100 mg Oral Daily   amLODipine  10 mg Oral Daily   atorvastatin  40 mg Oral QHS   carvedilol  25 mg Oral BID WC   Chlorhexidine Gluconate Cloth  6 each Topical Q0600   Chlorhexidine Gluconate Cloth  6 each Topical Q0600   cloNIDine  0.1 mg Oral TID   darbepoetin (ARANESP) injection - DIALYSIS  60 mcg Subcutaneous Q Sat-1800   feeding supplement  1 Container Oral TID BM   feeding supplement (PROSource TF20)  60 mL Per Tube Daily   finasteride  5 mg Oral Daily   hydrALAZINE  25 mg Oral Q8H   influenza vac split trivalent PF  0.5 mL Intramuscular Tomorrow-1000   insulin aspart  0-5 Units Subcutaneous QHS   insulin aspart  0-6 Units Subcutaneous TID WC   insulin glargine-yfgn  5 Units Subcutaneous Daily   leptospermum manuka honey  1 Application Topical Daily   levETIRAcetam  1,500 mg Oral BID   liver oil-zinc oxide   Topical BID   losartan  50 mg Oral Daily   multivitamin  1 tablet Oral QHS   ondansetron (ZOFRAN) IV  4 mg Intravenous Q12H   pantoprazole  40 mg Oral BID   PARoxetine  10 mg Oral Daily   sodium chloride flush  3 mL Intravenous Q12H   terazosin  1 mg Oral QHS   Vitamin D (Ergocalciferol)  50,000 Units Oral Q7 days   Continuous Infusions:  cefTAZidime (FORTAZ)  IV 1 g (09/23/23 2123)    LOS: 7 days   Time spent:  [name], DO [facility]  If 7PM-7AM, please contact night-coverage [email or web address]  09/24/2023, 8:37 AM

## 2023-09-24 NOTE — Consult Note (Addendum)
WOC Nurse wound follow up Refer to previous consult notes on 11/12 and 11/13  Consult requested to clarify the wound care orders. Discussed plan of care with patient's wife. She is familiar with the wound care routine prior to admission and pt will eventually be discharged with home health. Vac was attempted earlier this week but patient has frequent liquid stools and the seal could not be maintained. Wife has requested we attempt to apply a Vac again on Mon if the patient is not stooling at that time.  Air mattresss order was previously requested but has not arrived yet.  Requested air mattress for bed again. Requested Dietician consult to attempt to change tube feeds or other additional interventions to bulk the stools Topical treatment orders provided for bedside nurses to perform as follows:   Change Sacrum dressing Q day as follows, or PRN if soiled with stool:  1. Clean sacral wound with Vasche-moistened gauze [place] # [phone]) 2. Wipe around wound edges with skin prep wipes [place] # [phone]) 3. Pack sacrum wound with Dakin's moistened gauze, then cover with ABD pad and tape.  4. Apply barrier cream to skin near rectum to protect from stool 5. Apply Medihoney and foam dressing to left buttock wound Q day and cover with foam dressing.  Change the foam dressing Q 3 days or PRN soiling  WOC team will assess on Monday to determine if a Vac dressing can be re-applied at that time.  Thank-you,  Cammie Mcgee MSN, RN, CWOCN, [place], CNS [phone]

## 2023-09-24 NOTE — Plan of Care (Signed)

## 2023-09-25 DIAGNOSIS — I619 Nontraumatic intracerebral hemorrhage, unspecified: Secondary | ICD-10-CM | POA: Diagnosis not present

## 2023-09-25 DIAGNOSIS — D72829 Elevated white blood cell count, unspecified: Secondary | ICD-10-CM | POA: Diagnosis not present

## 2023-09-25 DIAGNOSIS — N3000 Acute cystitis without hematuria: Secondary | ICD-10-CM | POA: Diagnosis not present

## 2023-09-25 DIAGNOSIS — L89154 Pressure ulcer of sacral region, stage 4: Secondary | ICD-10-CM | POA: Diagnosis not present

## 2023-09-25 LAB — CBC
HCT: 22.9 % — ABNORMAL LOW (ref 39.0–52.0)
Hemoglobin: 7.5 g/dL — ABNORMAL LOW (ref 13.0–17.0)
MCH: 31.3 pg (ref 26.0–34.0)
MCHC: 32.8 g/dL (ref 30.0–36.0)
MCV: 95.4 fL (ref 80.0–100.0)
Platelets: 199 10*3/uL (ref 150–400)
RBC: 2.4 MIL/uL — ABNORMAL LOW (ref 4.22–5.81)
RDW: 14.9 % (ref 11.5–15.5)
WBC: 8.2 10*3/uL (ref 4.0–10.5)
nRBC: 0 % (ref 0.0–0.2)

## 2023-09-25 LAB — RENAL FUNCTION PANEL
Albumin: <1.5 g/dL — ABNORMAL LOW (ref 3.5–5.0)
Anion gap: 7 (ref 5–15)
BUN: 15 mg/dL (ref 6–20)
CO2: 25 mmol/L (ref 22–32)
Calcium: 8.1 mg/dL — ABNORMAL LOW (ref 8.9–10.3)
Chloride: 100 mmol/L (ref 98–111)
Creatinine, Ser: 2.45 mg/dL — ABNORMAL HIGH (ref 0.61–1.24)
GFR, Estimated: 31 mL/min — ABNORMAL LOW (ref >60)
Glucose, Bld: 126 mg/dL — ABNORMAL HIGH (ref 70–99)
Phosphorus: 3.2 mg/dL (ref 2.5–4.6)
Potassium: 4.6 mmol/L (ref 3.5–5.1)
Sodium: 132 mmol/L — ABNORMAL LOW (ref 135–145)

## 2023-09-25 LAB — GLUCOSE, CAPILLARY
Glucose-Capillary: 153 mg/dL — ABNORMAL HIGH (ref 70–99)
Glucose-Capillary: 184 mg/dL — ABNORMAL HIGH (ref 70–99)
Glucose-Capillary: 240 mg/dL — ABNORMAL HIGH (ref 70–99)
Glucose-Capillary: 92 mg/dL (ref 70–99)

## 2023-09-25 LAB — PHOSPHORUS: Phosphorus: 3.2 mg/dL (ref 2.5–4.6)

## 2023-09-25 MED ORDER — HEPARIN SODIUM (PORCINE) 1000 UNIT/ML IJ SOLN
3200.0000 [IU] | Freq: Once | INTRAMUSCULAR | Status: AC
  Administered 2023-09-25: 3200 [IU]

## 2023-09-25 MED ORDER — MELATONIN 5 MG PO TABS
5.0000 mg | ORAL_TABLET | Freq: Every evening | ORAL | Status: DC | PRN
  Administered 2023-09-25 – 2023-10-03 (×6): 5 mg via ORAL
  Filled 2023-09-25 (×6): qty 1

## 2023-09-25 NOTE — Progress Notes (Signed)
Received patient in bed to unit.  Alert and oriented.  Informed consent signed and in chart.   TX duration:4  Patient tolerated well.  Transported back to the room  Alert, without acute distress.  Hand-off given to patient's nurse.   Access used: right HD catheter Access issues: none  Total UF removed: 2L Medication(s) given: none   09/25/23 1153  Vitals  Temp 97.9 F (36.6 C)  Temp Source Oral  BP 139/81  MAP (mmHg) 98  BP Location Right Arm  BP Method Automatic  Patient Position (if appropriate) Lying  Pulse Rate 87  Pulse Rate Source Monitor  ECG Heart Rate 87  Resp 18  Oxygen Therapy  SpO2 98 %  O2 Device Room Air  During Treatment Monitoring  HD Safety Checks Performed Yes  Intra-Hemodialysis Comments Tx completed;Tolerated well  Dialysis Fluid Bolus Normal Saline  Bolus Amount (mL) 300 mL      Celine Dishman [facility]

## 2023-09-25 NOTE — Progress Notes (Signed)
Subjective: No current complaints denies nausea, tolerating HD UF.  Objective Vital signs in last 24 hours: Vitals:   09/25/23 0714 09/25/23 0719 09/25/23 0730 09/25/23 0800  BP: 139/70  (!) 146/77 (!) 140/76  Pulse: 73  76 77  Resp: 17  17 (!) 24  Temp: 97.9 F (36.6 C)     TempSrc: Oral     SpO2: 98%  99% 99%  Weight:  95.6 kg    Height:       Weight change:   Physical Exam: General: Alert chronically ill-appearing male with left-sided hemiparesis/NAD Heart: RRR no MRG Lungs: CTA bilaterally Abdomen: NABS, soft NTND feeding tube present dry clear Extremities: Left BKA, right foot dressing dry clear no lower extremity edema appreciated Dialysis Access: Right IJ TDC     OP Dialysis Orders: T[facility] - [facility]  4hrs, BFR 500, DFR AF 1.5,  EDW 98.6kg, 3K/ 2.5Ca   Access: TDC  Heparin none Mircera 50 mcg q2wks - not yet given Hectorol IV qHD     Assessment/Plan: Vomiting - Daily for months, now resolved per patient, G tube malfunction.  Noted IR 11/11 consult "both lumens flush now easily."  Further plans per PMD.  Noted needs feedings albumin less than 1 .5 Hyponatremia=130> 132 today improved after UF on HD  ESRD -  On HD TTS. Started during prolonged hospitalization but previously followed for CKD stage 5 planning for HD.  Suspect muscle wasting causing falsely low SCr.Marland Kitchen  Hyponatremia 3 L UF.   ESRD most likely . Can check 24hr urine to confirm (still pending), UOP appears to be improved over the past two days.  Continue HD 11/16 next  Leukocytosis - WBC RESOLVING WORKUP PER ADMIT AND ID consulted.  UTI suspected, ABX started.  Blood cultures obtained.  Has sacral decub but not thought to be source of infection  Peg tube malfunction - IR  consulted.  As above.  And appears functional now further plans per PMD  Hypertension/volume  - BP in goal. On multiple meds, can be titrating with HD UF.  Does not appear grossly overloaded.  Pleural effusions noted on CT. Variable weights  here. Continue UF with HD as tolerated.   Anemia of CKD -Hgb 7.5 s/p 1 unit PRBC. Aranesp 60 mcg recently started, continue weekly dosed today 11/16  Secondary Hyperparathyroidism -  CCa ok. Phos in the 1's improved today 3.2 with pharmacy help supplementing phos dosing.   Nutrition - Regular diet, follow labs. Alb<1.5.  Low phosphorus, K okay add protein supplements.  Dietary consulted by admit team Hx hemorrhagic stroke -left-sided hemiparesis -Rx Per admit team Hypokalemia -resolved, 4.6 K+ now at goal. Supplements already given. Using higher K bath with HD  Lenny Pastel, PA-C [facility] Beeper [phone] 09/25/2023,8:41 AM  LOS: 8 days   Labs: Basic Metabolic Panel: Recent Labs  Lab 09/23/23 0855 09/24/23 0636 09/25/23 0421 09/25/23 0640  NA 130* 132*  --  132*  K 4.3 4.2  --  4.6  CL 98 98  --  100  CO2 26 25  --  25  GLUCOSE 272* 134*  --  126*  BUN 19 11  --  15  CREATININE 2.38* 1.95*  --  2.45*  CALCIUM 7.1* 7.7*  --  8.1*  PHOS 1.4* 2.1* 3.2 3.2   Liver Function Tests: Recent Labs  Lab 09/19/23 0452 09/21/23 0831 09/21/23 1215 09/22/23 1631 09/23/23 0855 09/24/23 0636 09/25/23 0640  AST 18 27  --  31  --   --   --  ALT 21 28  --  30  --   --   --   ALKPHOS 99 117  --  116  --   --   --   BILITOT 0.5 0.5  --  0.3  --   --   --   PROT 4.9* 5.3*  --  4.5*  --   --   --   ALBUMIN <1.5* <1.5*   < > <1.5* <1.5* <1.5* <1.5*   < > = values in this interval not displayed.   No results for input(s): "LIPASE", "AMYLASE" in the last 168 hours. No results for input(s): "AMMONIA" in the last 168 hours. CBC: Recent Labs  Lab 09/21/23 0831 09/21/23 1215 09/22/23 1631 09/23/23 0855 09/25/23 0640  WBC 13.6* 11.3* 10.8* 12.4* 8.2  HGB 8.4* 8.2* 7.4* 7.3* 7.5*  HCT 25.3* 24.7* 22.3* 22.1* 22.9*  MCV 93.0 94.6 96.1 97.4 95.4  PLT 251 226 196 189 199   Cardiac Enzymes: No results for input(s): "CKTOTAL", "CKMB", "CKMBINDEX", "TROPONINI" in the  last 168 hours. CBG: Recent Labs  Lab 09/24/23 0431 09/24/23 0734 09/24/23 1128 09/24/23 1609 09/24/23 2003  GLUCAP 130* 124* 118* 175* 175*    Studies/Results: No results found. Medications:  cefTAZidime (FORTAZ)  IV 1 g (09/23/23 2123)    amiodarone  100 mg Oral Daily   amLODipine  10 mg Oral Daily   ascorbic acid  500 mg Oral BID   atorvastatin  40 mg Oral QHS   carvedilol  25 mg Oral BID WC   Chlorhexidine Gluconate Cloth  6 each Topical Q0600   Chlorhexidine Gluconate Cloth  6 each Topical Q0600   cloNIDine  0.1 mg Oral TID   darbepoetin (ARANESP) injection - DIALYSIS  60 mcg Subcutaneous Q Sat-1800   feeding supplement  237 mL Oral BID BM   finasteride  5 mg Oral Daily   hydrALAZINE  25 mg Oral Q8H   influenza vac split trivalent PF  0.5 mL Intramuscular Tomorrow-1000   insulin aspart  0-5 Units Subcutaneous QHS   insulin aspart  0-6 Units Subcutaneous TID WC   insulin glargine-yfgn  5 Units Subcutaneous Daily   leptospermum manuka honey  1 Application Topical Daily   levETIRAcetam  1,500 mg Oral BID   liver oil-zinc oxide   Topical BID   losartan  50 mg Oral Daily   multivitamin  1 tablet Oral QHS   nutrition supplement (JUVEN)  1 packet Oral BID BM   pantoprazole  40 mg Oral BID   PARoxetine  10 mg Oral Daily   phosphorus  250 mg Oral Once   sodium chloride flush  3 mL Intravenous Q12H   sodium hypochlorite   Topical 1 day or 1 dose   terazosin  1 mg Oral QHS   vitamin A  50,000 Units Oral Daily   Vitamin D (Ergocalciferol)  50,000 Units Oral Q7 days

## 2023-09-25 NOTE — Plan of Care (Signed)
  Problem: Coping: Goal: Ability to adjust to condition or change in health will improve Outcome: Progressing   

## 2023-09-25 NOTE — Progress Notes (Signed)
PROGRESS NOTE    Bruce Little  OAC:[id number] DOB: 1971-08-06 DOA: 09/17/2023 PCP: Westley Hummer, MD   Brief Narrative:  This is a 52 year old male with DM, PAF on Eliquis in the past, CKD now on dialysis, left BKA who comes into the hospital with intermittent nausea and vomiting, fever, and clogged PEG tube.   He has had a prolonged hospitalization at [facility] in [place] in August, admitted there 04/30/2023 and discharged 07/01/2023.  Hospital course reviewed.  He was visiting [place] from [place], was in a hotel when he was found to have altered mental status, vomiting.  He was found to be hypertensive in the ER with a blood pressure of 226/100, and a CT of the head showed 9.2 x 5.5 cm right frontal temporal parenchymal bleed with edema, mass effect and 1.1 cm left midline shift.  He is status post craniectomy and hematoma evacuation and EVD placement.  Hospital course complicated by Staph epidermidis in the CSF 7/17, will repeat growth 7/22 and 7/24.  Eventually EVD was removed, and there were no plans to replace the bone flap and will need artificial plate eventually.  He will developed sacral decubitus ulcer and underwent serial debridements, sacral wounds grew E. coli, [place]ella and Enterococcus faecalis and placed of antibiotics for several weeks.  Hospital course was also complicated by C. difficile diarrhea status post full course of vancomycin while being on IV antibiotics also, and in addition, had a PEG and a trach and developed renal failure requiring dialysis.   Following [place] hospitalization, [facility] and [facility] was there August 22 through August 10, 2023.  Worth mentioning is that he was decannulated on 07/23/2023, and weaned off to room air, and also with ongoing SLP he was started on p.o. intake along with his PEG tube.  He has been in rehab through October, but apparently has been home for few weeks prior to being  here.  Assessment & Plan:   Principal Problem:   Vomiting Active Problems:   Renal insufficiency   HTN (hypertension)   UTI (urinary tract infection)   Leukocytosis   Sacral decubitus ulcer   ICH (intracerebral hemorrhage) (HCC)   Malnutrition of moderate degree  Sepsis, POA Febrile, leukocytosis with notable source of UTI at intake Complicated by chronic foley (replaced in our ED at intake) Completed ceftriaxone course   Cultures were sent but remain negative Temperature/mental status continues to wax and wane but could be secondary to previous intracranial hemorrhage  History of PAF-currently in sinus rhythm.   Continue amiodarone, Coreg No longer on anticoagulation following his ICH in August 2024   Nausea, vomiting, questionably chronic, resolving -No further episodes this morning, continue Zofran prn every 6h -Hold tube feeds unless absolutely necessary given ongoing diarrhea and recurrent vomiting questionably secondary to intolerance   History of ICH-closely monitor mental status.  CT scan done on admission does not show any evidence of recurrent bleed.  He has established with local neurosurgery, I do not have direct access to notes but there were plans in place from [facility] hospitalization to eventually have a plate fitted to his cranium  ESRD-nephrology following, getting dialysis while here  Chronic urinary retention-with chronic Foley   Stage IV sacral decubitus ulcer, POA-CT scan this admission showed resorption of the inferior sacrum and coccyx without abscess.  ID consulted as well.  Underwent debridement by general surgery in August, and no further debridements were recommended following his [facility] stay -Surgery consulted,  underwent bedside debridement 11/11.  Wound care following - wound vac likely not going to be continued given poor adherence -current recommendations for Medihoney, foam cover exchange daily/soiled. Wife would like to attempt woundvac replacement  per to discharge if possible -Per discussion with surgery and wound care this is likely not ideal given location but will reattempt placement on 09/27/2023   Hyponatremia-in the setting of renal disease   Hypokalemia-continue to monitor and replenish as indicated   Vitamin D deficiency-start supplementation   Anemia-of chronic renal disease as well as chronic illness.  Transfuse unit of packed red blood cells for hemoglobin of 6.7.  Hemoglobin improved appropriately, today CBC pending   Clogged PEG tube-IR consulted, his PEG tube was replaced on 11/11.  RD consult today pending to initiate tube feeds.  Will prefer to do continues rather bolus to minimize risk of nausea and vomiting   Status post trach-now decannulated, on room air   History of seizures-continue Keppra   Essential hypertension-continue antihypertensives as below, blood pressure stable this morning   Type 2 diabetes mellitus A1C 5.1 - resume low dose glargine and follow as PO intake improves   DVT prophylaxis: SCDs Start: 09/17/23 2336 Code Status:   Code Status: Full Code Family Communication: Wife at bedside  Status is: Inpt  Dispo: The patient is from: SNF              Anticipated d/c is to: Same              Anticipated d/c date is: 48-72h              Patient currently NOT medically stable for discharge  Consultants:  Nephrology  Procedures:  None  Antimicrobials:  Completed  Subjective: No acute issues or events overnight patient denies nausea vomiting headache fever chills or chest pain  Objective: Vitals:   09/25/23 0714 09/25/23 0719 09/25/23 0730 09/25/23 0800  BP: 139/70  (!) 146/77 (!) 140/76  Pulse: 73  76 77  Resp: 17  17 (!) 24  Temp: 97.9 F (36.6 C)     TempSrc: Oral     SpO2: 98%  99% 99%  Weight:  95.6 kg    Height:        Intake/Output Summary (Last 24 hours) at 09/25/2023 0831 Last data filed at 09/24/2023 0939 Gross per 24 hour  Intake 250 ml  Output --  Net 250 ml    Filed Weights   09/23/23 0500 09/24/23 0500 09/25/23 0719  Weight: 98 kg 98.1 kg 95.6 kg    Examination:  General:  Pleasantly resting in bed, No acute distress. HEENT: Prior surgical scars/cranial deformity consistent with history. Neck:  Without mass or deformity. Lungs:  Clear to auscultate bilaterally without rhonchi, wheeze, or rales. Heart:  Regular rate and rhythm.  Without murmurs, rubs, or gallops. Abdomen:  Soft, nontender, nondistended.  Without guarding or rebound. Extremities: Without cyanosis, clubbing Skin: Stage 4 sacral decubitus ulcer   Data Reviewed: I have personally reviewed following labs and imaging studies  CBC: Recent Labs  Lab 09/21/23 0831 09/21/23 1215 09/22/23 1631 09/23/23 0855 09/25/23 0640  WBC 13.6* 11.3* 10.8* 12.4* 8.2  HGB 8.4* 8.2* 7.4* 7.3* 7.5*  HCT 25.3* 24.7* 22.3* 22.1* 22.9*  MCV 93.0 94.6 96.1 97.4 95.4  PLT 251 226 196 189 199   Basic Metabolic Panel: Recent Labs  Lab 09/19/23 0452 09/21/23 0831 09/21/23 1215 09/21/23 1802 09/22/23 1631 09/23/23 0600 09/23/23 0855 09/24/23 0636 09/25/23 0421 09/25/23 6045  NA 131*   < > 129*  --  131*  --  130* 132*  --  132*  K 3.2*   < > 3.7  --  3.9  --  4.3 4.2  --  4.6  CL 97*   < > 96*  --  97*  --  98 98  --  100  CO2 27   < > 24  --  26  --  26 25  --  25  GLUCOSE 98   < > 238*  --  339*  --  272* 134*  --  126*  BUN 5*   < > 14  --  15  --  19 11  --  15  CREATININE 1.15   < > 2.54*  --  1.84*  --  2.38* 1.95*  --  2.45*  CALCIUM 7.1*   < > 7.2*  --  7.0*  --  7.1* 7.7*  --  8.1*  MG 1.5*  --   --  1.4* 1.4* 1.4*  --   --   --   --   PHOS  --    < > 2.1* 1.2* 1.5* 1.5* 1.4* 2.1* 3.2 3.2   < > = values in this interval not displayed.   GFR: Estimated Creatinine Clearance: 41.5 mL/min (A) (by C-G formula based on SCr of 2.45 mg/dL (H)). Liver Function Tests: Recent Labs  Lab 09/19/23 0452 09/21/23 0831 09/21/23 1215 09/22/23 1631 09/23/23 0855 09/24/23 0636  09/25/23 0640  AST 18 27  --  31  --   --   --   ALT 21 28  --  30  --   --   --   ALKPHOS 99 117  --  116  --   --   --   BILITOT 0.5 0.5  --  0.3  --   --   --   PROT 4.9* 5.3*  --  4.5*  --   --   --   ALBUMIN <1.5* <1.5* <1.5* <1.5* <1.5* <1.5* <1.5*   No results for input(s): "LIPASE", "AMYLASE" in the last 168 hours.  Coagulation Profile: No results for input(s): "INR", "PROTIME" in the last 168 hours.  CBG: Recent Labs  Lab 09/24/23 0431 09/24/23 0734 09/24/23 1128 09/24/23 1609 09/24/23 2003  GLUCAP 130* 124* 118* 175* 175*   Recent Results (from the past 240 hour(s))  Urine Culture     Status: Abnormal   Collection Time: 09/17/23  3:18 PM   Specimen: Urine, Catheterized  Result Value Ref Range Status   Specimen Description URINE, CATHETERIZED  Final   Special Requests   Final    NONE Performed at [facility], [address] [place], [place] [address]    Culture 80,000 COLONIES/mL PSEUDOMONAS AERUGINOSA (A)  Final   Report Status 09/20/2023 FINAL  Final   Organism ID, Bacteria PSEUDOMONAS AERUGINOSA (A)  Final      Susceptibility   Pseudomonas aeruginosa - MIC*    CEFTAZIDIME 4 SENSITIVE Sensitive     CIPROFLOXACIN 1 INTERMEDIATE Intermediate     GENTAMICIN <=1 SENSITIVE Sensitive     IMIPENEM 1 SENSITIVE Sensitive     PIP/TAZO 8 SENSITIVE Sensitive ug/mL    * 80,000 COLONIES/mL PSEUDOMONAS AERUGINOSA  Blood culture (routine x 2)     Status: None   Collection Time: 09/17/23  8:15 PM   Specimen: BLOOD RIGHT HAND  Result Value Ref Range Status   Specimen Description BLOOD RIGHT  HAND  Final   Special Requests   Final    BOTTLES DRAWN AEROBIC AND ANAEROBIC Blood Culture results may not be optimal due to an inadequate volume of blood received in culture bottles   Culture   Final    NO GROWTH 5 DAYS Performed at [facility], [address] [place], [place] [address]    Report Status 09/22/2023 FINAL  Final  Blood culture (routine x 2)      Status: None   Collection Time: 09/17/23  8:24 PM   Specimen: BLOOD RIGHT ARM  Result Value Ref Range Status   Specimen Description BLOOD RIGHT ARM  Final   Special Requests   Final    BOTTLES DRAWN AEROBIC AND ANAEROBIC Blood Culture adequate volume   Culture   Final    NO GROWTH 5 DAYS Performed at [facility], [address] [place], [place] [address]    Report Status 09/22/2023 FINAL  Final         Radiology Studies: No results found.      Scheduled Meds:  amiodarone  100 mg Oral Daily   amLODipine  10 mg Oral Daily   ascorbic acid  500 mg Oral BID   atorvastatin  40 mg Oral QHS   carvedilol  25 mg Oral BID WC   Chlorhexidine Gluconate Cloth  6 each Topical Q0600   Chlorhexidine Gluconate Cloth  6 each Topical Q0600   cloNIDine  0.1 mg Oral TID   darbepoetin (ARANESP) injection - DIALYSIS  60 mcg Subcutaneous Q Sat-1800   feeding supplement  237 mL Oral BID BM   finasteride  5 mg Oral Daily   hydrALAZINE  25 mg Oral Q8H   influenza vac split trivalent PF  0.5 mL Intramuscular Tomorrow-1000   insulin aspart  0-5 Units Subcutaneous QHS   insulin aspart  0-6 Units Subcutaneous TID WC   insulin glargine-yfgn  5 Units Subcutaneous Daily   leptospermum manuka honey  1 Application Topical Daily   levETIRAcetam  1,500 mg Oral BID   liver oil-zinc oxide   Topical BID   losartan  50 mg Oral Daily   multivitamin  1 tablet Oral QHS   nutrition supplement (JUVEN)  1 packet Oral BID BM   pantoprazole  40 mg Oral BID   PARoxetine  10 mg Oral Daily   phosphorus  250 mg Oral Once   sodium chloride flush  3 mL Intravenous Q12H   sodium hypochlorite   Topical 1 day or 1 dose   terazosin  1 mg Oral QHS   vitamin A  50,000 Units Oral Daily   Vitamin D (Ergocalciferol)  50,000 Units Oral Q7 days   Continuous Infusions:  cefTAZidime (FORTAZ)  IV 1 g (09/23/23 2123)    LOS: 8 days   Time spent:  [name], DO [facility]  If 7PM-7AM,  please contact night-coverage [email or web address]  09/25/2023, 8:31 AM

## 2023-09-26 DIAGNOSIS — L89154 Pressure ulcer of sacral region, stage 4: Secondary | ICD-10-CM | POA: Diagnosis not present

## 2023-09-26 DIAGNOSIS — D72829 Elevated white blood cell count, unspecified: Secondary | ICD-10-CM | POA: Diagnosis not present

## 2023-09-26 DIAGNOSIS — I619 Nontraumatic intracerebral hemorrhage, unspecified: Secondary | ICD-10-CM | POA: Diagnosis not present

## 2023-09-26 DIAGNOSIS — N3000 Acute cystitis without hematuria: Secondary | ICD-10-CM | POA: Diagnosis not present

## 2023-09-26 LAB — GLUCOSE, CAPILLARY
Glucose-Capillary: 115 mg/dL — ABNORMAL HIGH (ref 70–99)
Glucose-Capillary: 111 mg/dL — ABNORMAL HIGH (ref 70–99)
Glucose-Capillary: 99 mg/dL (ref 70–99)
Glucose-Capillary: 121 mg/dL — ABNORMAL HIGH (ref 70–99)
Glucose-Capillary: 141 mg/dL — ABNORMAL HIGH (ref 70–99)

## 2023-09-26 NOTE — Progress Notes (Addendum)
Subjective: No current complaints in room, tolerating  bolus PEG feedings, has yet to attempt breakfast.  Denies nausea, SOB.  Objective Vital signs in last 24 hours: Vitals:   09/25/23 1522 09/25/23 2004 09/26/23 0430 09/26/23 0700  BP: 116/60 108/67 116/71 122/68  Pulse: 84 78 75 80  Resp: 16 18  17   Temp: 98.6 F (37 C) 99.3 F (37.4 C) 98 F (36.7 C) 97.6 F (36.4 C)  TempSrc: Oral Oral Oral Oral  SpO2: 99% 100% 99% 98%  Weight:      Height:       Weight change:   Physical Exam: General: Alert chronically ill-appearing male with left-sided hemiparesis/NAD Heart: RRR no MRG Lungs: CTA bilaterally Abdomen: NABS, soft NTND PEG  tube present dry clear Extremities: Left BKA healed,, right foot dressing dry clear no lower extremity edema appreciated Dialysis Access: Right IJ TDC dressing dry clean     OP Dialysis Orders: TTS - [facility]  4hrs, BFR 500, DFR AF 1.5,  EDW 98.6kg, 3K/ 2.5Ca   Access: TDC  Heparin none Mircera 50 mcg q2wks - not yet given Hectorol IV qHD     Assessment/Plan: Vomiting - Daily for months, now resolved per patient, G tube malfunction.  Noted IR 11/11 consult "both lumens flush now easily."  Management per admit team tolerating bolus feedings currently.  ALB less than 1.5 dietary consulted getting supplements Hyponatremia=130> 132 timproved after UF on HD  ESRD -  On HD TTS. Started during prolonged hospitalization but previously followed for CKD stage 5 planning for HD.  Suspect muscle wasting causing falsely low SCr.Marland Kitchen  Hyponatremia 3 L UF.   Continue HD 11/19 next  Leukocytosis - WBC RESOLVING WORKUP PER ADMIT AND ID consulted.  UTI suspected, ABX started.  Blood cultures obtained.  Has sacral decub status post 11/11 surgical bedside debridement, wound care following, "wife would like attempt for wound VAC replacement prior to discharge if possible " reattempt placement 09/27/23 surgery team per admit team Peg tube malfunction - IR  consulted.  As  above.  And appears functional now further plans per PMD  Hypertension/volume  - BP in goal to slightly lowish past 24 hours, on multiple meds, can be titrating with HD UF.  Does not appear grossly overloaded.  Pleural effusions noted on CT. Variable weights here. Continue UF with HD as tolerated.   Anemia of CKD -Hgb 7.5 s/p 1 unit PRBC. Aranesp 60 mcg recently started, continue weekly dosed today 11/16  Secondary Hyperparathyroidism -  CCa  10.2 , Phos in the 1's improved today 3.2 with pharmacy help supplementing phos dosing.   Nutrition - Regular diet, follow labs. Alb<1.5.  Low phosphorus, K okay add protein supplements.  Dietary consulted by admit team Hx hemorrhagic stroke -left-sided hemiparesis -Rx Per admit team/noted previous rehab center admit and was at home prior to this admission Hypokalemia -resolved, 4.6 K+ now at goal. Supplements already given. Using higher K bath with HD  Lenny Pastel, PA-C [facility] [phone] 09/26/2023,9:23 AM  LOS: 9 days   Labs: Basic Metabolic Panel: Recent Labs  Lab 09/23/23 0855 09/24/23 0636 09/25/23 0421 09/25/23 0640  NA 130* 132*  --  132*  K 4.3 4.2  --  4.6  CL 98 98  --  100  CO2 26 25  --  25  GLUCOSE 272* 134*  --  126*  BUN 19 11  --  15  CREATININE 2.38* 1.95*  --  2.45*  CALCIUM 7.1*  7.7*  --  8.1*  PHOS 1.4* 2.1* 3.2 3.2   Liver Function Tests: Recent Labs  Lab 09/21/23 0831 09/21/23 1215 09/22/23 1631 09/23/23 0855 09/24/23 0636 09/25/23 0640  AST 27  --  31  --   --   --   ALT 28  --  30  --   --   --   ALKPHOS 117  --  116  --   --   --   BILITOT 0.5  --  0.3  --   --   --   PROT 5.3*  --  4.5*  --   --   --   ALBUMIN <1.5*   < > <1.5* <1.5* <1.5* <1.5*   < > = values in this interval not displayed.   No results for input(s): "LIPASE", "AMYLASE" in the last 168 hours. No results for input(s): "AMMONIA" in the last 168 hours. CBC: Recent Labs  Lab 09/21/23 0831 09/21/23 1215  09/22/23 1631 09/23/23 0855 09/25/23 0640  WBC 13.6* 11.3* 10.8* 12.4* 8.2  HGB 8.4* 8.2* 7.4* 7.3* 7.5*  HCT 25.3* 24.7* 22.3* 22.1* 22.9*  MCV 93.0 94.6 96.1 97.4 95.4  PLT 251 226 196 189 199   Cardiac Enzymes: No results for input(s): "CKTOTAL", "CKMB", "CKMBINDEX", "TROPONINI" in the last 168 hours. CBG: Recent Labs  Lab 09/25/23 1605 09/25/23 2001 09/25/23 2350 09/26/23 0431 09/26/23 0701  GLUCAP 184* 240* 153* 115* 111*    Studies/Results: No results found. Medications:   amiodarone  100 mg Oral Daily   amLODipine  10 mg Oral Daily   ascorbic acid  500 mg Oral BID   atorvastatin  40 mg Oral QHS   carvedilol  25 mg Oral BID WC   Chlorhexidine Gluconate Cloth  6 each Topical Q0600   cloNIDine  0.1 mg Oral TID   darbepoetin (ARANESP) injection - DIALYSIS  60 mcg Subcutaneous Q Sat-1800   feeding supplement  237 mL Oral BID BM   finasteride  5 mg Oral Daily   hydrALAZINE  25 mg Oral Q8H   influenza vac split trivalent PF  0.5 mL Intramuscular Tomorrow-1000   insulin aspart  0-5 Units Subcutaneous QHS   insulin aspart  0-6 Units Subcutaneous TID WC   insulin glargine-yfgn  5 Units Subcutaneous Daily   leptospermum manuka honey  1 Application Topical Daily   levETIRAcetam  1,500 mg Oral BID   liver oil-zinc oxide   Topical BID   losartan  50 mg Oral Daily   multivitamin  1 tablet Oral QHS   nutrition supplement (JUVEN)  1 packet Oral BID BM   pantoprazole  40 mg Oral BID   PARoxetine  10 mg Oral Daily   sodium chloride flush  3 mL Intravenous Q12H   sodium hypochlorite   Topical 1 day or 1 dose   terazosin  1 mg Oral QHS   vitamin A  50,000 Units Oral Daily   Vitamin D (Ergocalciferol)  50,000 Units Oral Q7 days

## 2023-09-26 NOTE — Progress Notes (Signed)
PROGRESS NOTE    Bruce Little  WUJ:[id number] DOB: 1971-04-28 DOA: 09/17/2023 PCP: Westley Hummer, MD   Brief Narrative:  This is a 52 year old male with DM, PAF on Eliquis in the past, CKD now on dialysis, left BKA who comes into the hospital with intermittent nausea and vomiting, fever, and clogged PEG tube.   He has had a prolonged hospitalization at [facility] in [place] in August, admitted there 04/30/2023 and discharged 07/01/2023.  Hospital course reviewed.  He was visiting [place] from [place], was in a hotel when he was found to have altered mental status, vomiting.  He was found to be hypertensive in the ER with a blood pressure of 226/100, and a CT of the head showed 9.2 x 5.5 cm right frontal temporal parenchymal bleed with edema, mass effect and 1.1 cm left midline shift.  He is status post craniectomy and hematoma evacuation and EVD placement.  Hospital course complicated by Staph epidermidis in the CSF 7/17, will repeat growth 7/22 and 7/24.  Eventually EVD was removed, and there were no plans to replace the bone flap and will need artificial plate eventually.  He will developed sacral decubitus ulcer and underwent serial debridements, sacral wounds grew E. coli, Morganella and Enterococcus faecalis and placed of antibiotics for several weeks.  Hospital course was also complicated by C. difficile diarrhea status post full course of vancomycin while being on IV antibiotics also, and in addition, had a PEG and a trach and developed renal failure requiring dialysis.   Following [place] hospitalization, [facility] and he was there August 22 through August 10, 2023.  Worth mentioning is that he was decannulated on 07/23/2023, and weaned off to room air, and also with ongoing SLP he was started on p.o. intake along with his PEG tube.  He has been in rehab through October, but apparently has been home for few weeks prior to being  here.  Assessment & Plan:   Principal Problem:   Vomiting Active Problems:   Renal insufficiency   HTN (hypertension)   UTI (urinary tract infection)   Leukocytosis   Sacral decubitus ulcer   ICH (intracerebral hemorrhage) (HCC)   Malnutrition of moderate degree  Sepsis, POA Febrile, leukocytosis with notable source of UTI at intake Complicated by chronic foley (replaced in our ED at intake) Completed ceftriaxone course   Cultures were sent but remain negative Temperature/mental status continues to wax and wane but could be secondary to previous intracranial hemorrhage  History of PAF-currently in sinus rhythm.   Continue amiodarone, Coreg No longer on anticoagulation following his ICH in August 2024   Nausea, vomiting, questionably chronic, resolving Mild-moderate protein caloric malnutrition -No further episodes of vomiting noted, continue Zofran prn every 6h -Lengthy discussion on need for increased PO intake vs tube feeds - Patient trying to avoid tube feeds if possible given recurrent diarrhea but his PO intake remains poor. Will likely need to advance tube feeds again as tolerated until PO intake improves.   History of ICH-closely monitor mental status.  CT scan done on admission does not show any evidence of recurrent bleed.  He has established with local neurosurgery, I do not have direct access to notes but there were plans in place from [place] hospitalization to eventually have a plate fitted to his cranium  ESRD-nephrology following, getting dialysis while here  Chronic urinary retention-with chronic Foley   Stage IV sacral decubitus ulcer, POA-CT scan this admission showed resorption of  the inferior sacrum and coccyx without abscess.  ID consulted as well.  Underwent debridement by general surgery in August, and no further debridements were recommended following his [facility] stay -Surgery consulted, underwent bedside debridement 11/11.  Wound care following - wound vac  likely not going to be continued given poor adherence -current recommendations for Medihoney, foam cover exchange daily/soiled. Wife would like to attempt woundvac replacement per to discharge if possible -Per discussion with surgery and wound care this is likely not ideal given location but wound care to reattempt placement on 09/27/2023   Hyponatremia-in the setting of renal disease   Hypokalemia-continue to monitor and replenish as indicated   Vitamin D deficiency-start supplementation   Anemia-of chronic renal disease as well as chronic illness.  Transfuse unit of packed red blood cells for hemoglobin of 6.7.  Hemoglobin improved appropriately, today CBC pending   Clogged PEG tube, rsolved -IR consulted, his PEG tube was replaced on 11/11.  RD consulted for tube feed recommendations. Feeds decreased/on hold per discussion with patient and family given profound loose stool   Status post trach-now decannulated, on room air   History of seizures-continue Keppra   Essential hypertension-continue antihypertensives as below, blood pressure stable this morning   Type 2 diabetes mellitus A1C 5.1 - resume low dose glargine and follow as PO intake improves   DVT prophylaxis: SCDs Start: 09/17/23 2336 Code Status:   Code Status: Full Code Family Communication: Wife at bedside  Status is: Inpt  Dispo: The patient is from: SNF              Anticipated d/c is to: Same              Anticipated d/c date is: 48-72h              Patient currently NOT medically stable for discharge  Consultants:  Nephrology  Procedures:  None  Antimicrobials:  Completed  Subjective: No acute issues or events overnight patient denies nausea vomiting headache fever chills or chest pain  Objective: Vitals:   09/25/23 1522 09/25/23 2004 09/26/23 0430 09/26/23 0700  BP: 116/60 108/67 116/71 122/68  Pulse: 84 78 75 80  Resp: 16 18  17   Temp: 98.6 F (37 C) 99.3 F (37.4 C) 98 F (36.7 C) 97.6 F  (36.4 C)  TempSrc: Oral Oral Oral Oral  SpO2: 99% 100% 99% 98%  Weight:      Height:        Intake/Output Summary (Last 24 hours) at 09/26/2023 0812 Last data filed at 09/26/2023 0526 Gross per 24 hour  Intake 460 ml  Output 3025 ml  Net -2565 ml   Filed Weights   09/24/23 0500 09/25/23 0719 09/25/23 1155  Weight: 98.1 kg 95.6 kg 93.6 kg    Examination:  General:  Pleasantly resting in bed, No acute distress. HEENT: Prior surgical scars/cranial deformity consistent with history. Neck:  Without mass or deformity. Lungs:  Clear to auscultate bilaterally without rhonchi, wheeze, or rales. Heart:  Regular rate and rhythm.  Without murmurs, rubs, or gallops. Abdomen:  Soft, nontender, nondistended.  Without guarding or rebound. Extremities: Without cyanosis, clubbing Skin: Stage 4 sacral decubitus ulcer   Data Reviewed: I have personally reviewed following labs and imaging studies  CBC: Recent Labs  Lab 09/21/23 0831 09/21/23 1215 09/22/23 1631 09/23/23 0855 09/25/23 0640  WBC 13.6* 11.3* 10.8* 12.4* 8.2  HGB 8.4* 8.2* 7.4* 7.3* 7.5*  HCT 25.3* 24.7* 22.3* 22.1* 22.9*  MCV 93.0 94.6 96.1  97.4 95.4  PLT 251 226 196 189 199   Basic Metabolic Panel: Recent Labs  Lab 09/21/23 1215 09/21/23 1802 09/22/23 1631 09/23/23 0600 09/23/23 0855 09/24/23 0636 09/25/23 0421 09/25/23 0640  NA 129*  --  131*  --  130* 132*  --  132*  K 3.7  --  3.9  --  4.3 4.2  --  4.6  CL 96*  --  97*  --  98 98  --  100  CO2 24  --  26  --  26 25  --  25  GLUCOSE 238*  --  339*  --  272* 134*  --  126*  BUN 14  --  15  --  19 11  --  15  CREATININE 2.54*  --  1.84*  --  2.38* 1.95*  --  2.45*  CALCIUM 7.2*  --  7.0*  --  7.1* 7.7*  --  8.1*  MG  --  1.4* 1.4* 1.4*  --   --   --   --   PHOS 2.1* 1.2* 1.5* 1.5* 1.4* 2.1* 3.2 3.2   GFR: Estimated Creatinine Clearance: 41.5 mL/min (A) (by C-G formula based on SCr of 2.45 mg/dL (H)).  Liver Function Tests: Recent Labs  Lab  09/21/23 0831 09/21/23 1215 09/22/23 1631 09/23/23 0855 09/24/23 0636 09/25/23 0640  AST 27  --  31  --   --   --   ALT 28  --  30  --   --   --   ALKPHOS 117  --  116  --   --   --   BILITOT 0.5  --  0.3  --   --   --   PROT 5.3*  --  4.5*  --   --   --   ALBUMIN <1.5* <1.5* <1.5* <1.5* <1.5* <1.5*   CBG: Recent Labs  Lab 09/25/23 1605 09/25/23 2001 09/25/23 2350 09/26/23 0431 09/26/23 0701  GLUCAP 184* 240* 153* 115* 111*   Recent Results (from the past 240 hour(s))  Urine Culture     Status: Abnormal   Collection Time: 09/17/23  3:18 PM   Specimen: Urine, Catheterized  Result Value Ref Range Status   Specimen Description URINE, CATHETERIZED  Final   Special Requests   Final    NONE Performed at [facility], [address] [place], [place] [address]    Culture 80,000 COLONIES/mL PSEUDOMONAS AERUGINOSA (A)  Final   Report Status 09/20/2023 FINAL  Final   Organism ID, Bacteria PSEUDOMONAS AERUGINOSA (A)  Final      Susceptibility   Pseudomonas aeruginosa - MIC*    CEFTAZIDIME 4 SENSITIVE Sensitive     CIPROFLOXACIN 1 INTERMEDIATE Intermediate     GENTAMICIN <=1 SENSITIVE Sensitive     IMIPENEM 1 SENSITIVE Sensitive     PIP/TAZO 8 SENSITIVE Sensitive ug/mL    * 80,000 COLONIES/mL PSEUDOMONAS AERUGINOSA  Blood culture (routine x 2)     Status: None   Collection Time: 09/17/23  8:15 PM   Specimen: BLOOD RIGHT HAND  Result Value Ref Range Status   Specimen Description BLOOD RIGHT HAND  Final   Special Requests   Final    BOTTLES DRAWN AEROBIC AND ANAEROBIC Blood Culture results may not be optimal due to an inadequate volume of blood received in culture bottles   Culture   Final    NO GROWTH 5 DAYS Performed at [facility], [address] [place],  [place] [address]    Report Status 09/22/2023 FINAL  Final  Blood culture (routine x 2)     Status: None   Collection Time: 09/17/23  8:24 PM   Specimen: BLOOD RIGHT ARM  Result Value Ref Range Status    Specimen Description BLOOD RIGHT ARM  Final   Special Requests   Final    BOTTLES DRAWN AEROBIC AND ANAEROBIC Blood Culture adequate volume   Culture   Final    NO GROWTH 5 DAYS Performed at [facility], [address] [place], [place] [address]    Report Status 09/22/2023 FINAL  Final     Radiology Studies: No results found.  Scheduled Meds:  amiodarone  100 mg Oral Daily   amLODipine  10 mg Oral Daily   ascorbic acid  500 mg Oral BID   atorvastatin  40 mg Oral QHS   carvedilol  25 mg Oral BID WC   Chlorhexidine Gluconate Cloth  6 each Topical Q0600   cloNIDine  0.1 mg Oral TID   darbepoetin (ARANESP) injection - DIALYSIS  60 mcg Subcutaneous Q Sat-1800   feeding supplement  237 mL Oral BID BM   finasteride  5 mg Oral Daily   hydrALAZINE  25 mg Oral Q8H   influenza vac split trivalent PF  0.5 mL Intramuscular Tomorrow-1000   insulin aspart  0-5 Units Subcutaneous QHS   insulin aspart  0-6 Units Subcutaneous TID WC   insulin glargine-yfgn  5 Units Subcutaneous Daily   leptospermum manuka honey  1 Application Topical Daily   levETIRAcetam  1,500 mg Oral BID   liver oil-zinc oxide   Topical BID   losartan  50 mg Oral Daily   multivitamin  1 tablet Oral QHS   nutrition supplement (JUVEN)  1 packet Oral BID BM   pantoprazole  40 mg Oral BID   PARoxetine  10 mg Oral Daily   sodium chloride flush  3 mL Intravenous Q12H   sodium hypochlorite   Topical 1 day or 1 dose   terazosin  1 mg Oral QHS   vitamin A  50,000 Units Oral Daily   Vitamin D (Ergocalciferol)  50,000 Units Oral Q7 days   Continuous Infusions:    LOS: 9 days   Time spent:  [name], DO [facility]  If 7PM-7AM, please contact night-coverage [email or web address]  09/26/2023, 8:12 AM

## 2023-09-26 NOTE — Plan of Care (Signed)

## 2023-09-27 ENCOUNTER — Inpatient Hospital Stay (HOSPITAL_COMMUNITY)
Admit: 2023-09-27 | Discharge: 2023-09-27 | Disposition: A | Discharge status: Home / Self Care | Payer: BC Managed Care – PPO | Attending: Internal Medicine | Admitting: Internal Medicine

## 2023-09-27 ENCOUNTER — Inpatient Hospital Stay (HOSPITAL_COMMUNITY): Payer: BC Managed Care – PPO

## 2023-09-27 DIAGNOSIS — R569 Unspecified convulsions: Secondary | ICD-10-CM

## 2023-09-27 DIAGNOSIS — R1110 Vomiting, unspecified: Secondary | ICD-10-CM | POA: Diagnosis not present

## 2023-09-27 LAB — GLUCOSE, CAPILLARY
Glucose-Capillary: 99 mg/dL (ref 70–99)
Glucose-Capillary: 138 mg/dL — ABNORMAL HIGH (ref 70–99)
Glucose-Capillary: 125 mg/dL — ABNORMAL HIGH (ref 70–99)
Glucose-Capillary: 159 mg/dL — ABNORMAL HIGH (ref 70–99)

## 2023-09-27 LAB — BASIC METABOLIC PANEL
Anion gap: 6 (ref 5–15)
BUN: 28 mg/dL — ABNORMAL HIGH (ref 6–20)
CO2: 24 mmol/L (ref 22–32)
Calcium: 8 mg/dL — ABNORMAL LOW (ref 8.9–10.3)
Chloride: 100 mmol/L (ref 98–111)
Creatinine, Ser: 2.42 mg/dL — ABNORMAL HIGH (ref 0.61–1.24)
GFR, Estimated: 32 mL/min — ABNORMAL LOW (ref >60)
Glucose, Bld: 112 mg/dL — ABNORMAL HIGH (ref 70–99)
Potassium: 4.4 mmol/L (ref 3.5–5.1)
Sodium: 130 mmol/L — ABNORMAL LOW (ref 135–145)

## 2023-09-27 LAB — CBC
HCT: 23.9 % — ABNORMAL LOW (ref 39.0–52.0)
Hemoglobin: 7.8 g/dL — ABNORMAL LOW (ref 13.0–17.0)
MCH: 31.5 pg (ref 26.0–34.0)
MCHC: 32.6 g/dL (ref 30.0–36.0)
MCV: 96.4 fL (ref 80.0–100.0)
Platelets: 227 10*3/uL (ref 150–400)
RBC: 2.48 MIL/uL — ABNORMAL LOW (ref 4.22–5.81)
RDW: 15.1 % (ref 11.5–15.5)
WBC: 10.5 10*3/uL (ref 4.0–10.5)
nRBC: 0 % (ref 0.0–0.2)

## 2023-09-27 NOTE — Progress Notes (Signed)
Speech Language Pathology Treatment: Dysphagia  Patient Details Name: AZAI KIRKMAN MRN: [id number] DOB: [birth year] Today's Date: 09/27/2023 Time: 4782-9562 SLP Time Calculation (min) (ACUTE ONLY): 17 min  Assessment / Plan / Recommendation Clinical Impression  Pt seen for f/u dysphagia tx with intake of current diet (Dysphagia 3/thin) and differential assessment of upgraded consistencies (Regular/thin) with small sips of thin via straw (with min cues for small sips provided by SLP) with delayed cough response noted 25% of time.  Pt consumed ice chips and tsp/small sips of thin with limited delayed cough noted where immediate cough noted prior sessions.  Pt is impulsive with intake without cueing, so safety is a concern with self-feeding without supervision provided.  Discussed A of Dysphagia 3(chopped) diet with nectar-thick liquids via straw recommendation to eliminate overt s/sx of aspiration during meals/snacks.  Pt with min prolonged masticatory effort with solid consistency, but behavior more of a concern post R CVA vs swallow function.  Recommend continue current diet of Dysphagia 3(chopped)/nectar-thick liquids for meals and intake of thin liquids via straw with FULL SUPERVISION with SLP/staff or caregivers.  ST will f/u briefly for diet tolerance during acute stay.  HPI HPI: Pt is a 52 year old male who presents with intermittent nausea and vomiting, fever, and clogged PEG tube. 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 Following [place] hospitalization, [facility] and was [facility] August 22 through August 10, 2023. Worth mentioning is that he was decannulated on 07/23/2023, and weaned off to room air. With ongoing SLP intervention he was started on p.o. intake along with his tube feeds. OP MBS at [facility] (08/16/23) revealed swallow function WNL with recommendations for regular diet/thin liquids  with supervision during PO intake given impulsivity. PMH: DM, PAF on Eliquis in the past, CKD now on dialysis, left BKA; ST recommended D3/NTL during tx sessions d/t coughing with liquids.  ST f/u for diet tolerance/education/potential progression of diet.      SLP Plan  Continue with current plan of care      Recommendations for follow up therapy are one component of a multi-disciplinary discharge planning process, led by the attending physician.  Recommendations may be updated based on patient status, additional functional criteria and insurance authorization.    Recommendations  Diet recommendations: Dysphagia 3 (mechanical soft);Nectar-thick liquid Liquids provided via: Cup;Straw Medication Administration: Whole meds with liquid Supervision: Staff to assist with self feeding;Intermittent supervision to cue for compensatory strategies Compensations: Minimize environmental distractions;Slow rate;Small sips/bites Postural Changes and/or Swallow Maneuvers: Seated upright 90 degrees                  Oral care BID   Set up Supervision/Assistance Dysphagia, unspecified (R13.10)     Continue with current plan of care     Pat Brigitt Mcclish,M.S.,CCC-SLP  09/27/2023, 1:55 PM

## 2023-09-27 NOTE — Procedures (Signed)
Patient Name: Bruce Little  MRN: [id number]  Epilepsy Attending: Charlsie Quest  Referring Physician/Provider: Leatha Gilding, MD  Date: 09/27/2023 Duration: 26.25 mins  Patient history: 52 yo M with h/o seizure getting eeg to evaluate for seizure  Level of alertness: Awake, asleep  AEDs during EEG study: LEV  Technical aspects: This EEG study was done with scalp electrodes positioned according to the 10-20 International system of electrode placement. Electrical activity was reviewed with band pass filter of 1-70Hz , sensitivity of 7 uV/mm, display speed of 1mm/sec with a 60Hz  notched filter applied as appropriate. EEG data were recorded continuously and digitally stored.  Video monitoring was available and reviewed as appropriate.  Description: The posterior dominant rhythm consists of 8-9 Hz activity of moderate voltage (25-35 uV) seen predominantly in posterior head regions, symmetric and reactive to eye opening and eye closing. Sleep was characterized by vertex waves, sleep spindles (12 to 14 Hz), maximal frontocentral region. EEG showed continuous polymorphic sharply contoured 3 to 6 Hz theta-delta slowing in right hemisphere. There was also overriding 12-14Hz  beta activity in right centro-parietal region consistent with breach artifact. Hyperventilation and photic stimulation were not performed.     ABNORMALITY - Breach artifact, right centro-parietal region  - Continuous slow, right hemisphere  IMPRESSION: This study is suggestive of cortical dysfunction arising from right centro-parietal region  consistent with underlying craniotomy. Additionally there is cortical dysfunction in right hemisphere likely secondary to underlying stroke. No seizures were seen throughout the recording.  [name]

## 2023-09-27 NOTE — Progress Notes (Signed)
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  Following [place] hospitalization, [facility] and was [facility] August 22 through August 10, 2023.  Worth mentioning is that he was decannulated on 07/23/2023, and weaned off to room air; from LTAC to SNF, where sacral wound worsened    PT Comments  Continuing work on functional mobility and activity tolerance;  Session focused on OOB to pt's WC transfers, and able to safely get to wheelchair with Maximove; Worth considering using Maximove to get up to HD recliner -- and use pt's cushion in HD recliner   If plan is discharge home, recommend the following: A lot of help with walking and/or transfers;A lot of help with bathing/dressing/bathroom   Can travel by private vehicle        Equipment Recommendations  Other (comment) (Overall well-equipped)    Recommendations for Other Services Other (comment) (Connect with PM&R for follow up)     Precautions / Restrictions Precautions Precautions: Fall;Other (comment) Precaution Comments: Crani with bone flap still out; helmet when OOB; reported dizziness sitting EOB, consider supine and sitting BPs     Mobility  Bed Mobility Overal bed mobility: Needs Assistance Bed Mobility: Rolling Rolling: Mod assist, Max assist         General bed mobility comments: Light mod assist to roll to L side; Max assist to roll R    Transfers Overall transfer level: Needs assistance Equipment used: Ambulation equipment used Transfers: Bed to chair/wheelchair/BSC             General transfer  comment: Maximove transfer bed to pt's wheelchair Transfer via Lift Equipment: Maximove  Ambulation/Gait                   Stairs             Wheelchair Mobility     Tilt Bed    Modified Rankin (Stroke Patients Only)       Balance                                            Cognition Arousal: Alert Behavior During Therapy: WFL for tasks assessed/performed, Flat affect Overall Cognitive Status: History of cognitive impairments - at baseline Area of Impairment: Following commands                       Following Commands: Follows one step commands with increased time       General Comments: able to follow commands and answer questions consistently and fully participate        Exercises      General Comments General comments (skin integrity, edema, etc.): Incr time to work on positioning in pt's own tilt in space wheelchair; Seems more comfortable with tilt backwards      Pertinent Vitals/Pain Pain Assessment Pain Assessment: No/denies pain Pain Intervention(s): Monitored during session    Home Living  Prior Function            PT Goals (current goals can now be found in the care plan section) Acute Rehab PT Goals Patient Stated Goal: Agreeable to working with PT PT Goal Formulation: Patient unable to participate in goal setting Time For Goal Achievement: 10/05/23 Potential to Achieve Goals: Fair Progress towards PT goals: Progressing toward goals    Frequency    Min 1X/week      PT Plan      Co-evaluation              AM-PAC PT "6 Clicks" Mobility   Outcome Measure  Help needed turning from your back to your side while in a flat bed without using bedrails?: A Lot Help needed moving from lying on your back to sitting on the side of a flat bed without using bedrails?: Total Help needed moving to and from a bed to a chair (including a wheelchair)?: Total Help  needed standing up from a chair using your arms (e.g., wheelchair or bedside chair)?: Total Help needed to walk in hospital room?: Total Help needed climbing 3-5 steps with a railing? : Total 6 Click Score: 7    End of Session Equipment Utilized During Treatment: Other (comment) (Maximove) Activity Tolerance: Patient tolerated treatment well Patient left: in chair;with call bell/phone within reach (lap seatbelt and chest belt fastened) Nurse Communication: Mobility status PT Visit Diagnosis: Other abnormalities of gait and mobility (R26.89);Other symptoms and signs involving the nervous system (R29.898);Hemiplegia and hemiparesis Hemiplegia - Right/Left: Left Hemiplegia - caused by: Cerebral infarction     Time: 1115-1228 PT Time Calculation (min) (ACUTE ONLY): 73 min  Charges:    $Therapeutic Activity: 68-82 mins PT General Charges $$ ACUTE PT VISIT: 1 Visit                     Van Clines, PT  Acute Rehabilitation Services Office [phone] Secure Chat welcomed    [name] 09/27/2023, 1:20 PM

## 2023-09-27 NOTE — Consult Note (Signed)
WOC Nurse wound follow up Discussed plan of care with patient's wife.  She states he is no longer stooling and would like to attempt to apply the Vac again to the sacrum/bilat buttocks wound. WOC team will re-apply tomorrow as requested.  Please re-consult if further assistance is needed.  Thank-you,  Cammie Mcgee MSN, RN, CWOCN, [place], CNS [phone]

## 2023-09-27 NOTE — Progress Notes (Signed)
   09/27/23 1408  Spiritual Encounters  Type of Visit Attempt (pt unavailable)  Conversation partners present during encounter Nurse;Physician  Referral source Code page  Reason for visit Code  OnCall Visit No   Responded to rapid response page - remained until dismissed by rapid response team member.

## 2023-09-27 NOTE — Progress Notes (Signed)
PROGRESS NOTE  Bruce Little NWG:[id number] DOB: Jan 20, 1971 DOA: 09/17/2023 PCP: Westley Hummer, MD   LOS: 10 days   Brief Narrative / Interim history: This is a 52 year old male with DM, PAF on Eliquis in the past, CKD now on dialysis, left BKA who comes into the hospital with intermittent nausea and vomiting, fever, and clogged PEG tube.  He has had a prolonged hospitalization at [facility] in [place] in August, admitted there 04/30/2023 and discharged 07/01/2023.  Hospital course reviewed.  He was visiting [place] from [place], was in a hotel when he was found to have altered mental status, vomiting.  He was found to be hypertensive in the ER with a blood pressure of 226/100, and a CT of the head showed 9.2 x 5.5 cm right frontal temporal parenchymal bleed with edema, mass effect and 1.1 cm left midline shift.  He is status post craniectomy and hematoma evacuation and EVD placement.  Hospital course complicated by Staph epidermidis in the CSF 7/17, will repeat growth 7/22 and 7/24.  Eventually EVD was removed, and there were no plans to replace the bone flap and will need artificial plate eventually.  He will developed sacral decubitus ulcer and underwent serial debridements, sacral wounds grew E. coli, Morganella and Enterococcus faecalis and placed of antibiotics for several weeks.  Hospital course was also complicated by C. difficile diarrhea status post full course of vancomycin while being on IV antibiotics also, and in addition, had a PEG and a trach and developed renal failure requiring dialysis.  Following [place] hospitalization, [facility] and [facility] was there August 22 through August 10, 2023.  Worth mentioning is that he was decannulated on 07/23/2023, and weaned off to room air, and also with ongoing SLP he was started on p.o. intake along with his PEG tube.  He has been in rehab through October, but apparently has been home for couple of  weeks prior to being here.  Subjective / 24h Interval events: Opens his eyes, denies any chest pain, denies any shortness of breath.  Tells me he is feeling well  Assesement and Plan: Principal problem SIRS with fever, leukocytosis -patient was febrile to 100.7 on admission, and had leukocytosis.  He was placed on antibiotics, and urine cultures eventually grew Pseudomonas.  ID consulted, he was treated and has completed antibiotics on 11/16.  Currently remains afebrile and white count is normalized  Active problems History of PAF-currently in sinus rhythm.  He is on amiodarone, Coreg, continue.  No longer on anticoagulation following his ICH in August  Nausea, vomiting, diarrhea induced by tube feeds-tells me he has been having some nausea but no further vomiting.  Will discus with the wife about reinitiation of tube feeds and maybe use Imodium  History of ICH-closely monitor mental status.  CT scan done on admission does not show any evidence of recurrent bleed.  He has established with local neurosurgery to have a plate fitted  ESRD-nephrology following, getting dialysis while here  Chronic urinary retention-with chronic Foley  Stage IV sacral decubitus ulcer, POA-CT scan this admission showed resorption of the inferior sacrum and coccyx without abscess.  ID consulted as well.  Underwent debridement by general surgery in August, and no further debridements were recommended following his [facility] stay -Surgery consulted, underwent bedside debridement 11/11.  Has had a wound VAC placed, however has been having diarrhea when he was resumed on his tube feeds.  Wound care consulted, diarrhea now is better,  may need to have wound VAC replaced  Hyponatremia-in the setting of renal disease  Hypokalemia-continue to monitor and replenish as indicated  Vitamin D deficiency-start supplementation  Anemia-of chronic renal disease as well as chronic illness.  Transfuse unit of packed red blood cells  for hemoglobin of 6.7.  Hemoglobin improved appropriately, today CBC pending  Status post trach-now decannulated, on room air  History of seizures-continue Keppra  Essential hypertension-continue antihypertensives as below, blood pressure stable  Type 2 diabetes mellitus-overall stable CBGs  Lab Results  Component Value Date   HGBA1C 5.1 09/18/2023   CBG (last 3)  Recent Labs    09/26/23 1606 09/26/23 2005 09/27/23 0738  GLUCAP 121* 141* 99    Scheduled Meds:  amiodarone  100 mg Oral Daily   amLODipine  10 mg Oral Daily   ascorbic acid  500 mg Oral BID   atorvastatin  40 mg Oral QHS   carvedilol  25 mg Oral BID WC   Chlorhexidine Gluconate Cloth  6 each Topical Q0600   cloNIDine  0.1 mg Oral TID   darbepoetin (ARANESP) injection - DIALYSIS  60 mcg Subcutaneous Q Sat-1800   feeding supplement  237 mL Oral BID BM   finasteride  5 mg Oral Daily   hydrALAZINE  25 mg Oral Q8H   influenza vac split trivalent PF  0.5 mL Intramuscular Tomorrow-1000   insulin aspart  0-5 Units Subcutaneous QHS   insulin aspart  0-6 Units Subcutaneous TID WC   insulin glargine-yfgn  5 Units Subcutaneous Daily   leptospermum manuka honey  1 Application Topical Daily   levETIRAcetam  1,500 mg Oral BID   liver oil-zinc oxide   Topical BID   losartan  50 mg Oral Daily   multivitamin  1 tablet Oral QHS   nutrition supplement (JUVEN)  1 packet Oral BID BM   pantoprazole  40 mg Oral BID   PARoxetine  10 mg Oral Daily   sodium chloride flush  3 mL Intravenous Q12H   sodium hypochlorite   Topical 1 day or 1 dose   terazosin  1 mg Oral QHS   vitamin A  50,000 Units Oral Daily   Vitamin D (Ergocalciferol)  50,000 Units Oral Q7 days   Continuous Infusions:   PRN Meds:.acetaminophen **OR** acetaminophen, albuterol, food thickener, melatonin, methocarbamol, ondansetron (ZOFRAN) IV, polyethylene glycol, prochlorperazine, sorbitol  Current Outpatient Medications  Medication Instructions   albuterol  (VENTOLIN HFA) 108 (90 Base) MCG/ACT inhaler 2 puffs, Inhalation, Every 4 hours PRN   amiodarone (PACERONE) 100 mg, Oral, Daily   amLODipine (NORVASC) 10 mg, Oral, Daily   atorvastatin (LIPITOR) 80 mg, Oral, Every evening   atorvastatin (LIPITOR) 40 mg, Oral, Daily at bedtime   carvedilol (COREG) 37.5 mg, Oral, 2 times daily with meals   cloNIDine (CATAPRES) 0.3 mg, Oral, 3 times daily   finasteride (PROSCAR) 5 mg, Oral, Daily   furosemide (LASIX) 40 mg, Oral, Daily   glipiZIDE (GLUCOTROL) 5 mg, Oral, Daily   hydrALAZINE (APRESOLINE) 25 mg, Oral, Every 8 hours   Lantus SoloStar 5 Units, Subcutaneous, Every 12 hours   levETIRAcetam (KEPPRA) 1,500 mg, Oral, 2 times daily   losartan (COZAAR) 100 mg, Oral, Daily   Melatonin 10 MG TABS 1 tablet, Oral, Daily at bedtime   ondansetron (ZOFRAN) 4 mg, Oral, Every 8 hours PRN   pantoprazole (PROTONIX) 40 mg, Oral, Daily   PARoxetine (PAXIL) 10 mg, Oral, Daily   terazosin (HYTRIN) 1 mg, Oral, Daily at bedtime  Diet Orders (From admission, onward)     Start     Ordered   09/22/23 1531  DIET DYS 3 Room service appropriate? Yes with Assist; Fluid consistency: Nectar Thick  Diet effective now       Comments: May have sips of thin liquids in between meals  Question Answer Comment  Room service appropriate? Yes with Assist   Fluid consistency: Nectar Thick      09/22/23 1531            DVT prophylaxis: SCDs Start: 09/17/23 2336   Lab Results  Component Value Date   PLT 227 09/27/2023      Code Status: Full Code  Family Communication: Discussed with wife [name]  Status is: Inpatient Remains inpatient appropriate because: severity of illness  Level of care: Telemetry Medical  Consultants:  Nephrology ID  Objective: Vitals:   09/26/23 1536 09/26/23 2006 09/26/23 2107 09/27/23 0555  BP: 128/68 (!) 102/59 126/70 129/62  Pulse: 77 75 74 80  Resp: 18 17  16   Temp: 98.5 F (36.9 C) 98.6 F (37 C)  97.9 F (36.6 C)   TempSrc: Oral Oral  Oral  SpO2: 99% 100%  99%  Weight:      Height:        Intake/Output Summary (Last 24 hours) at 09/27/2023 1116 Last data filed at 09/27/2023 0557 Gross per 24 hour  Intake 680 ml  Output 1650 ml  Net -970 ml   Wt Readings from Last 3 Encounters:  09/25/23 93.6 kg  04/14/23 111.1 kg  10/29/21 86.1 kg    Examination:  Constitutional: NAD Eyes: lids and conjunctivae normal, no scleral icterus ENMT: mmm Neck: normal, supple Respiratory: clear to auscultation bilaterally, no wheezing, no crackles. Normal respiratory effort.  Cardiovascular: Regular rate and rhythm, no murmurs / rubs / gallops. No LE edema. Abdomen: soft, no distention, no tenderness. Bowel sounds positive.   Data Reviewed: I have independently reviewed following labs and imaging studies   CBC Recent Labs  Lab 09/21/23 1215 09/22/23 1631 09/23/23 0855 09/25/23 0640 09/27/23 0440  WBC 11.3* 10.8* 12.4* 8.2 10.5  HGB 8.2* 7.4* 7.3* 7.5* 7.8*  HCT 24.7* 22.3* 22.1* 22.9* 23.9*  PLT 226 196 189 199 227  MCV 94.6 96.1 97.4 95.4 96.4  MCH 31.4 31.9 32.2 31.3 31.5  MCHC 33.2 33.2 33.0 32.8 32.6  RDW 15.3 15.2 14.8 14.9 15.1    Recent Labs  Lab 09/21/23 0831 09/21/23 1215 09/21/23 1802 09/22/23 1631 09/23/23 0600 09/23/23 0855 09/24/23 0636 09/25/23 0640 09/27/23 0440  NA 133* 129*  --  131*  --  130* 132* 132* 130*  K 3.5 3.7  --  3.9  --  4.3 4.2 4.6 4.4  CL 98 96*  --  97*  --  98 98 100 100  CO2 27 24  --  26  --  26 25 25 24   GLUCOSE 124* 238*  --  339*  --  272* 134* 126* 112*  BUN 15 14  --  15  --  19 11 15  28*  CREATININE 2.63* 2.54*  --  1.84*  --  2.38* 1.95* 2.45* 2.42*  CALCIUM 7.5* 7.2*  --  7.0*  --  7.1* 7.7* 8.1* 8.0*  AST 27  --   --  31  --   --   --   --   --   ALT 28  --   --  30  --   --   --   --   --  ALKPHOS 117  --   --  116  --   --   --   --   --   BILITOT 0.5  --   --  0.3  --   --   --   --   --   ALBUMIN <1.5* <1.5*  --  <1.5*  --  <1.5*  <1.5* <1.5*  --   MG  --   --  1.4* 1.4* 1.4*  --   --   --   --     ------------------------------------------------------------------------------------------------------------------ No results for input(s): "CHOL", "HDL", "LDLCALC", "TRIG", "CHOLHDL", "LDLDIRECT" in the last 72 hours.  Lab Results  Component Value Date   HGBA1C 5.1 09/18/2023   ------------------------------------------------------------------------------------------------------------------ No results for input(s): "TSH", "T4TOTAL", "T3FREE", "THYROIDAB" in the last 72 hours.  Invalid input(s): "FREET3"  Cardiac Enzymes No results for input(s): "CKMB", "TROPONINI", "MYOGLOBIN" in the last 168 hours.  Invalid input(s): "CK" ------------------------------------------------------------------------------------------------------------------ No results found for: "BNP"  CBG: Recent Labs  Lab 09/26/23 0701 09/26/23 1131 09/26/23 1606 09/26/23 2005 09/27/23 0738  GLUCAP 111* 99 121* 141* 99    Recent Results (from the past 240 hour(s))  Urine Culture     Status: Abnormal   Collection Time: 09/17/23  3:18 PM   Specimen: Urine, Catheterized  Result Value Ref Range Status   Specimen Description URINE, CATHETERIZED  Final   Special Requests   Final    NONE Performed at [facility], [address], [place], [place] [address]    Culture 80,000 COLONIES/mL PSEUDOMONAS AERUGINOSA (A)  Final   Report Status 09/20/2023 FINAL  Final   Organism ID, Bacteria PSEUDOMONAS AERUGINOSA (A)  Final      Susceptibility   Pseudomonas aeruginosa - MIC*    CEFTAZIDIME 4 SENSITIVE Sensitive     CIPROFLOXACIN 1 INTERMEDIATE Intermediate     GENTAMICIN <=1 SENSITIVE Sensitive     IMIPENEM 1 SENSITIVE Sensitive     PIP/TAZO 8 SENSITIVE Sensitive ug/mL    * 80,000 COLONIES/mL PSEUDOMONAS AERUGINOSA  Blood culture (routine x 2)     Status: None   Collection Time: 09/17/23  8:15 PM   Specimen: BLOOD RIGHT HAND  Result  Value Ref Range Status   Specimen Description BLOOD RIGHT HAND  Final   Special Requests   Final    BOTTLES DRAWN AEROBIC AND ANAEROBIC Blood Culture results may not be optimal due to an inadequate volume of blood received in culture bottles   Culture   Final    NO GROWTH 5 DAYS Performed at [facility], [address], [place], [place] [address]    Report Status 09/22/2023 FINAL  Final  Blood culture (routine x 2)     Status: None   Collection Time: 09/17/23  8:24 PM   Specimen: BLOOD RIGHT ARM  Result Value Ref Range Status   Specimen Description BLOOD RIGHT ARM  Final   Special Requests   Final    BOTTLES DRAWN AEROBIC AND ANAEROBIC Blood Culture adequate volume   Culture   Final    NO GROWTH 5 DAYS Performed at [facility], [address] [place], [place] [address]    Report Status 09/22/2023 FINAL  Final     Radiology Studies: No results found.   Pamella Pert, MD, PhD [facility]  Between 7 am - 7 pm I am available, please contact me via Amion (for emergencies) or Securechat (non urgent messages)  Between 7 pm - 7 am I  am not available, please contact night coverage MD/APP via Amion

## 2023-09-27 NOTE — Progress Notes (Signed)
EEG complete - results pending 

## 2023-09-27 NOTE — Progress Notes (Addendum)
[facility] Progress Note   Subjective: seen in room, up in chair talking with PT. No C/Os.     Objective Vitals:   09/26/23 1536 09/26/23 2006 09/26/23 2107 09/27/23 0555  BP: 128/68 (!) 102/59 126/70 129/62  Pulse: 77 75 74 80  Resp: 18 17  16   Temp: 98.5 F (36.9 C) 98.6 F (37 C)  97.9 F (36.6 C)  TempSrc: Oral Oral  Oral  SpO2: 99% 100%  99%  Weight:      Height:       Physical Exam General: Chronically ill appearing male in NAD Heart: RRR Lungs: CTAB No WOB Abdomen: Peg tube. 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 Dialysis Access: RIJ [facility] drsg intact    Additional Objective Labs: Basic Metabolic Panel: Recent Labs  Lab 09/24/23 0636 09/25/23 0421 09/25/23 0640 09/27/23 0440  NA 132*  --  132* 130*  K 4.2  --  4.6 4.4  CL 98  --  100 100  CO2 25  --  25 24  GLUCOSE 134*  --  126* 112*  BUN 11  --  15 28*  CREATININE 1.95*  --  2.45* 2.42*  CALCIUM 7.7*  --  8.1* 8.0*  PHOS 2.1* 3.2 3.2  --    Liver Function Tests: Recent Labs  Lab 09/21/23 0831 09/21/23 1215 09/22/23 1631 09/23/23 0855 09/24/23 0636 09/25/23 0640  AST 27  --  31  --   --   --   ALT 28  --  30  --   --   --   ALKPHOS 117  --  116  --   --   --   BILITOT 0.5  --  0.3  --   --   --   PROT 5.3*  --  4.5*  --   --   --   ALBUMIN <1.5*   < > <1.5* <1.5* <1.5* <1.5*   < > = values in this interval not displayed.   No results for input(s): "LIPASE", "AMYLASE" in the last 168 hours. CBC: Recent Labs  Lab 09/21/23 1215 09/22/23 1631 09/23/23 0855 09/25/23 0640 09/27/23 0440  WBC 11.3* 10.8* 12.4* 8.2 10.5  HGB 8.2* 7.4* 7.3* 7.5* 7.8*  HCT 24.7* 22.3* 22.1* 22.9* 23.9*  MCV 94.6 96.1 97.4 95.4 96.4  PLT 226 196 189 199 227   Blood Culture    Component Value Date/Time   SDES BLOOD RIGHT ARM 09/17/2023 2024   SPECREQUEST  09/17/2023 2024    BOTTLES DRAWN AEROBIC AND ANAEROBIC Blood Culture adequate volume   CULT  09/17/2023 2024    NO GROWTH 5  DAYS Performed at [facility], [address] [place], [place] [address]    REPTSTATUS 09/22/2023 FINAL 09/17/2023 2024    Cardiac Enzymes: No results for input(s): "CKTOTAL", "CKMB", "CKMBINDEX", "TROPONINI" in the last 168 hours. CBG: Recent Labs  Lab 09/26/23 1131 09/26/23 1606 09/26/23 2005 09/27/23 0738 09/27/23 1133  GLUCAP 99 121* 141* 99 138*   Iron Studies: No results for input(s): "IRON", "TIBC", "TRANSFERRIN", "FERRITIN" in the last 72 hours. @lablastinr3 @ Studies/Results: No results found. Medications:   amiodarone  100 mg Oral Daily   amLODipine  10 mg Oral Daily   ascorbic acid  500 mg Oral BID   atorvastatin  40 mg Oral QHS   carvedilol  25 mg Oral BID WC   Chlorhexidine Gluconate Cloth  6 each Topical Q0600   cloNIDine  0.1 mg Oral TID  darbepoetin (ARANESP) injection - DIALYSIS  60 mcg Subcutaneous Q Sat-1800   feeding supplement  237 mL Oral BID BM   finasteride  5 mg Oral Daily   hydrALAZINE  25 mg Oral Q8H   influenza vac split trivalent PF  0.5 mL Intramuscular Tomorrow-1000   insulin aspart  0-5 Units Subcutaneous QHS   insulin aspart  0-6 Units Subcutaneous TID WC   insulin glargine-yfgn  5 Units Subcutaneous Daily   leptospermum manuka honey  1 Application Topical Daily   levETIRAcetam  1,500 mg Oral BID   liver oil-zinc oxide   Topical BID   losartan  50 mg Oral Daily   multivitamin  1 tablet Oral QHS   nutrition supplement (JUVEN)  1 packet Oral BID BM   pantoprazole  40 mg Oral BID   PARoxetine  10 mg Oral Daily   sodium chloride flush  3 mL Intravenous Q12H   sodium hypochlorite   Topical 1 day or 1 dose   terazosin  1 mg Oral QHS   vitamin A  50,000 Units Oral Daily   Vitamin D (Ergocalciferol)  50,000 Units Oral Q7 days     TTS - [place]  4hrs 500/AF 1.5,  EDW 98.6kg, 3.0 K/ 2.5Ca TDC  -No Heparin  - Mircera 50 mcg q2wks - not yet given - Hectorol IV TIW    Assessment/Plan: Vomiting - Daily for months, now resolved  per patient, G tube malfunction.  Noted IR 11/11 consult "both lumens flush now easily."  Management per admit team tolerating bolus feedings currently.  ALB less than 1.5 dietary consulted getting supplements Hyponatremia-130> 132 timproved after UF on HD. Suspect this is a chronic issue. Follow labs.   ESRD -  On HD TTS. Started during prolonged hospitalization but previously followed for CKD stage 5 planning for HD.  Suspect muscle wasting causing falsely low SCr.Marland Kitchen  Hyponatremia 3 L UF.   Continue HD 11/19 next  Leukocytosis - WBC RESOLVING WORKUP PER ADMIT AND ID consulted.  UTI suspected, ABX started.  Blood cultures obtained.  Has sacral decub status post 11/11 surgical bedside debridement, wound care following, "wife would like attempt for wound VAC replacement prior to discharge if possible " reattempt placement 09/27/23 surgery team per admit team Peg tube malfunction - IR  consulted.  As above.  And appears functional now further plans per PMD  Hypertension/volume  - BP in goal to slightly lowish past 24 hours, on multiple meds, can be titrating with HD UF.  Does not appear grossly overloaded.  Pleural effusions noted on CT. Variable weights here. Continue UF with HD as tolerated.   Anemia of CKD -Hgb 7.8 s/p 1 unit PRBC. Aranesp 60 mcg weekly. Follow HGB.   Secondary Hyperparathyroidism -  CCa  10.2 , Phos in the 1's improved today 3.2 with pharmacy help supplementing phos dosing.   Nutrition - Regular diet, follow labs. Alb<1.5.  Low phosphorus, K okay add protein supplements.  Dietary consulted by admit team Hx hemorrhagic stroke -left-sided hemiparesis -Rx Per admit team/noted previous rehab center admit and was at home prior to this admission Hypokalemia -resolved. Follow labs.    Chidiebere Wynn H. Syanne Looney NP-C 09/27/2023, 1:38 PM  [employer] [phone]

## 2023-09-28 DIAGNOSIS — R1110 Vomiting, unspecified: Secondary | ICD-10-CM | POA: Diagnosis not present

## 2023-09-28 LAB — CBC
HCT: 22.5 % — ABNORMAL LOW (ref 39.0–52.0)
Hemoglobin: 7.6 g/dL — ABNORMAL LOW (ref 13.0–17.0)
MCH: 32.1 pg (ref 26.0–34.0)
MCHC: 33.8 g/dL (ref 30.0–36.0)
MCV: 94.9 fL (ref 80.0–100.0)
Platelets: 253 10*3/uL (ref 150–400)
RBC: 2.37 MIL/uL — ABNORMAL LOW (ref 4.22–5.81)
RDW: 15.1 % (ref 11.5–15.5)
WBC: 9.3 10*3/uL (ref 4.0–10.5)
nRBC: 0 % (ref 0.0–0.2)

## 2023-09-28 LAB — RENAL FUNCTION PANEL
Albumin: 1.5 g/dL — ABNORMAL LOW (ref 3.5–5.0)
Anion gap: 6 (ref 5–15)
BUN: 31 mg/dL — ABNORMAL HIGH (ref 6–20)
CO2: 23 mmol/L (ref 22–32)
Calcium: 8.2 mg/dL — ABNORMAL LOW (ref 8.9–10.3)
Chloride: 102 mmol/L (ref 98–111)
Creatinine, Ser: 2.8 mg/dL — ABNORMAL HIGH (ref 0.61–1.24)
GFR, Estimated: 26 mL/min — ABNORMAL LOW (ref >60)
Glucose, Bld: 102 mg/dL — ABNORMAL HIGH (ref 70–99)
Phosphorus: 3.7 mg/dL (ref 2.5–4.6)
Potassium: 4.5 mmol/L (ref 3.5–5.1)
Sodium: 131 mmol/L — ABNORMAL LOW (ref 135–145)

## 2023-09-28 LAB — MAGNESIUM: Magnesium: 1.7 mg/dL (ref 1.7–2.4)

## 2023-09-28 LAB — GLUCOSE, CAPILLARY
Glucose-Capillary: 95 mg/dL (ref 70–99)
Glucose-Capillary: 100 mg/dL — ABNORMAL HIGH (ref 70–99)
Glucose-Capillary: 111 mg/dL — ABNORMAL HIGH (ref 70–99)
Glucose-Capillary: 156 mg/dL — ABNORMAL HIGH (ref 70–99)

## 2023-09-28 MED ORDER — HEPARIN SODIUM (PORCINE) 1000 UNIT/ML DIALYSIS
1000.0000 [IU] | INTRAMUSCULAR | Status: DC | PRN
Start: 2023-09-28 — End: 2023-09-28
  Administered 2023-09-28: 3200 [IU]
  Filled 2023-09-28: qty 1

## 2023-09-28 MED ORDER — ANTICOAGULANT SODIUM CITRATE 4% (200MG/5ML) IV SOLN: 5.0000 mL | Status: DC | PRN

## 2023-09-28 MED ORDER — MEDIHONEY WOUND/BURN DRESSING EX PSTE
1.0000 | PASTE | Freq: Every day | CUTANEOUS | Status: DC
  Administered 2023-09-28 – 2023-10-03 (×4): 1 via TOPICAL
  Filled 2023-09-28: qty 44

## 2023-09-28 MED ORDER — ALTEPLASE 2 MG IJ SOLR: 2.0000 mg | Freq: Once | INTRAMUSCULAR | Status: DC | PRN

## 2023-09-28 MED ORDER — DARBEPOETIN ALFA 100 MCG/0.5ML IJ SOSY
100.0000 ug | PREFILLED_SYRINGE | INTRAMUSCULAR | Status: DC
  Administered 2023-10-02: 100 ug via SUBCUTANEOUS
  Filled 2023-09-28: qty 0.5

## 2023-09-28 NOTE — Progress Notes (Signed)
Received patient in bed to unit.  Alert and oriented.  Informed consent signed and in chart.   TX duration:  Patient didn't tolerate well. Patient continued to turn his head towards catheter and pausing his treatment. RN asked patient why he was doing this. Patient respoded, " I want to go, Im ready to get off". Patient taken off. Provider brown notified.  Transported back to the room  Alert, without acute distress.  Hand-off given to patient's nurse.   Access used: catheter Access issues: n/a  Total UF removed: 800 ML  Medication(s) given: n/a Post HD weight: 89.4kg    [facility]

## 2023-09-28 NOTE — Plan of Care (Signed)

## 2023-09-28 NOTE — Consult Note (Addendum)
WOC Nurse Consult Note:  Reason for Consult: Surgical team following for assessment and plan of care.   WOC team was requested to apply again the Vac dressing for chronic Stage 4 pressure injury to the sacrum, close to the anus. The seal is working, but because the area is so close to the anus, and we could see a soft stool surrounding, check if there some leaking. Wound Bed: 70% Red, 30% Yellow. Maceration on peri-wound skin close to the anus. Measurement: 10x12x5cm  Removed old NPWT dressing Cleansed wound with normal saline Periwound skin protected with skin barrier wipe or window framed with drape Skin protected to the hip with VAC drape for foam bridge  Filled wound with 2 piece of black foam, and a barrier ring at the bottom to make a better seal. Sealed NPWT dressing at HG/117mmHG  Patient received IV/PO pain medication per bedside nurse prior to dressing change Patient tolerated procedure well Any pain medication was needed.  WOC nurse will continue to provide NPWT dressing changed due to the complexity of the dressing change.     Buttocks wound on the left side: Pressure Injury POA: Yes Wound bed: Yellow covering 100%.  Measurement: 2x2cm. Dressing procedure/placement/frequency: Medihoney, change everyday, and a foam dressing covering, change every 3 days or if is saturated.   Silvestre Moment Acquanetta Sit WOC nurse

## 2023-09-28 NOTE — Progress Notes (Signed)
Occupational Therapy Treatment Patient Details Name: Bruce Little MRN: [id number] DOB: 06-Jul-1971 Today's Date: 09/28/2023   History of present illness Pt is a 52 year old male who presents with intermittent nausea and vomiting, fever, and clogged PEG tube; PEG tube replaced in IR 11/11; stage IV sacral wound as well; He had a prolonged hospitalization in [place] (04/30/2023- 07/01/2023) after severe CVA requiring craniotomy, tracheostomy, PEG.  Following [place] hospitalization, [facility] and was [facility] August 22 through August 10, 2023.  Worth mentioning is that he was decannulated on 07/23/2023, and weaned off to room air; from [facility] to SNF, where sacral wound worsened   OT comments  Pt lying comfortably in bed, states no pain, no discomfort, family in room during session. Pt completed exercises at bed level in supine. Instructed on proper form using theraband tied to R bed rail and bed level exercises for RLE. Pt completed 40 reps for all exercises displaying fair RUE strength, poor RLE strength. Pt 1/5 for plantar/dorsiflexion of R foot, able to complete AROM of knee flexion/extension, using theraband for resistance.  Pt repositioned in bed to allow for L knee extension at rest, and ensuring that LUE was placed comfortably. Pt would benefit from continued skilled acute OT to progress as able, DC plan still appropriate.       If plan is discharge home, recommend the following:  Two people to help with walking and/or transfers;Two people to help with bathing/dressing/bathroom;Assistance with cooking/housework;Assistance with feeding;Direct supervision/assist for medications management;Direct supervision/assist for financial management;Assist for transportation;Help with stairs or ramp for entrance   Equipment Recommendations  None recommended by OT    Recommendations for Other Services      Precautions / Restrictions Precautions Precautions: Fall;Other  (comment) Precaution Comments: Crani with bone flap still out; helmet when OOB; reported dizziness sitting EOB, consider supine and sitting BPs Restrictions Weight Bearing Restrictions: Yes LLE Weight Bearing: Non weight bearing       Mobility Bed Mobility                    Transfers                         Balance                                           ADL either performed or assessed with clinical judgement   ADL                                              Extremity/Trunk Assessment Upper Extremity Assessment Upper Extremity Assessment: Generalized weakness;LUE deficits/detail LUE Deficits / Details: L hemi, flaccid, good PROM LUE Coordination: decreased gross motor;decreased fine motor            Vision       Perception     Praxis      Cognition Arousal: Alert Behavior During Therapy: WFL for tasks assessed/performed, Flat affect Overall Cognitive Status: History of cognitive impairments - at baseline                                 General Comments: able to follow commands and  answer questions consistently and fully participate        Exercises Exercises: General Upper Extremity, General Lower Extremity General Exercises - Upper Extremity Shoulder Flexion: Strengthening, Theraband, 15 reps Theraband Level (Shoulder Flexion): Level 2 (Red) Shoulder Horizontal ADduction: Strengthening, 15 reps, Theraband Theraband Level (Shoulder Horizontal Adduction): Level 2 (Red) Elbow Flexion: Strengthening, 15 reps, Theraband Theraband Level (Elbow Flexion): Level 2 (Red) Elbow Extension: 15 reps, Strengthening, Theraband Theraband Level (Elbow Extension): Level 2 (Red) Digit Composite Flexion: Squeeze ball General Exercises - Lower Extremity Heel Slides: Strengthening, Supine Straight Leg Raises: AROM, Supine, Strengthening Hip Flexion/Marching: AROM, Supine Toe Raises: Supine,  Strengthening Heel Raises: Strengthening, Supine    Shoulder Instructions       General Comments      Pertinent Vitals/ Pain       Pain Assessment Pain Assessment: No/denies pain  Home Living                                          Prior Functioning/Environment              Frequency  Min 1X/week        Progress Toward Goals  OT Goals(current goals can now be found in the care plan section)  Progress towards OT goals: Progressing toward goals  Acute Rehab OT Goals Patient Stated Goal: to return home OT Goal Formulation: With patient/family Time For Goal Achievement: 10/05/23 Potential to Achieve Goals: Fair ADL Goals Additional ADL Goal #1: Pt will be able to complete rolling to L side with min A to decrease need for caregiver support, using bed rails as needed for support. Additional ADL Goal #2: Pt will be able to sit on EOB up to 5 minutes with one hand supported to increase participation in ADLs and overall core strength. Additional ADL Goal #3: Pt will be able to manage affected LUE to maximize safety and prevent awkward and unsafe positioning at rest and during bed mobility/transfers.  Plan      Co-evaluation                 AM-PAC OT "6 Clicks" Daily Activity     Outcome Measure   Help from another person eating meals?: A Little Help from another person taking care of personal grooming?: A Lot Help from another person toileting, which includes using toliet, bedpan, or urinal?: Total Help from another person bathing (including washing, rinsing, drying)?: Total Help from another person to put on and taking off regular upper body clothing?: A Lot Help from another person to put on and taking off regular lower body clothing?: Total 6 Click Score: 10    End of Session    OT Visit Diagnosis: Other abnormalities of gait and mobility (R26.89);Muscle weakness (generalized) (M62.81);Other symptoms and signs involving cognitive  function;Pain;Hemiplegia and hemiparesis Hemiplegia - Right/Left: Left Hemiplegia - dominant/non-dominant: Non-Dominant   Activity Tolerance Patient tolerated treatment well   Patient Left in bed;with call bell/phone within reach;with family/visitor present   Nurse Communication Mobility status        Time: 2376-2831 OT Time Calculation (min): 28 min  Charges: OT General Charges $OT Visit: 1 Visit OT Treatments $Therapeutic Exercise: 23-37 mins  Namrata Dangler, OTR/L   Alexis Goodell 09/28/2023, 4:43 PM

## 2023-09-28 NOTE — Progress Notes (Signed)
[name] Progress Note   Subjective: Seen prior to starting HD. Not talking very much but denies pain or discomfort. UF as tolerated.     Objective Vitals:   09/27/23 2034 09/28/23 0442 09/28/23 0802 09/28/23 0918  BP: 137/77 117/66 136/61 128/68  Pulse: 74 75 73 75  Resp:   17 14  Temp: 98 F (36.7 C) 98.8 F (37.1 C) 98 F (36.7 C) 97.8 F (36.6 C)  TempSrc:      SpO2: 97% 97% 100% 96%  Weight:    90.7 kg  Height:         Physical Exam General: Chronically ill appearing male in NAD Heart: RRR Lungs: CTAB No WOB Abdomen: Peg tube. 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 Dialysis Access: RIJ [facility] drsg intact      Additional Objective Labs: Basic Metabolic Panel: Recent Labs  Lab 09/25/23 0421 09/25/23 0640 09/27/23 0440 09/28/23 0535  NA  --  132* 130* 131*  K  --  4.6 4.4 4.5  CL  --  100 100 102  CO2  --  25 24 23   GLUCOSE  --  126* 112* 102*  BUN  --  15 28* 31*  CREATININE  --  2.45* 2.42* 2.80*  CALCIUM  --  8.1* 8.0* 8.2*  PHOS 3.2 3.2  --  3.7   Liver Function Tests: Recent Labs  Lab 09/22/23 1631 09/23/23 0855 09/24/23 0636 09/25/23 0640 09/28/23 0535  AST 31  --   --   --   --   ALT 30  --   --   --   --   ALKPHOS 116  --   --   --   --   BILITOT 0.3  --   --   --   --   PROT 4.5*  --   --   --   --   ALBUMIN <1.5*   < > <1.5* <1.5* 1.5*   < > = values in this interval not displayed.   No results for input(s): "LIPASE", "AMYLASE" in the last 168 hours. CBC: Recent Labs  Lab 09/22/23 1631 09/23/23 0855 09/25/23 0640 09/27/23 0440 09/28/23 0535  WBC 10.8* 12.4* 8.2 10.5 9.3  HGB 7.4* 7.3* 7.5* 7.8* 7.6*  HCT 22.3* 22.1* 22.9* 23.9* 22.5*  MCV 96.1 97.4 95.4 96.4 94.9  PLT 196 189 199 227 253   Blood Culture    Component Value Date/Time   SDES BLOOD RIGHT ARM 09/17/2023 2024   SPECREQUEST  09/17/2023 2024    BOTTLES DRAWN AEROBIC AND ANAEROBIC Blood Culture adequate volume   CULT  09/17/2023 2024    NO  GROWTH 5 DAYS Performed at [facility], [address], [place], [place] [address]    REPTSTATUS 09/22/2023 FINAL 09/17/2023 2024    Cardiac Enzymes: No results for input(s): "CKTOTAL", "CKMB", "CKMBINDEX", "TROPONINI" in the last 168 hours. CBG: Recent Labs  Lab 09/27/23 0738 09/27/23 1133 09/27/23 1650 09/27/23 2034 09/28/23 0804  GLUCAP 99 138* 125* 159* 95   Iron Studies: No results for input(s): "IRON", "TIBC", "TRANSFERRIN", "FERRITIN" in the last 72 hours. @lablastinr3 @ Studies/Results: CT HEAD WO CONTRAST ( )  Result Date: 09/28/2023 CLINICAL DATA:  Stroke follow-up EXAM: CT HEAD WITHOUT CONTRAST TECHNIQUE: Contiguous axial images were obtained from the base of the skull through the vertex without intravenous contrast. RADIATION DOSE REDUCTION: This exam was performed according to the departmental dose-optimization program which includes automated exposure control, adjustment of the mA and/or  kV according to patient size and/or use of iterative reconstruction technique. COMPARISON:  Nine days prior FINDINGS: Brain: Extensive encephalomalacia involving the right cerebral hemisphere with broad craniectomy. Although bulging appearance of the brain in some areas towards the vertex suspicious for vasogenic edema, there is overall volume loss and white matter low-density is primarily attributed to encephalomalacia. No evidence of acute infarct, acute hemorrhage, hydrocephalus, mass, or collection. There is cerebral volume loss and ventriculomegaly. Vascular: No hyperdense vessel or unexpected calcification. Skull: Broad craniectomy on the right. Sinuses/Orbits: Negative IMPRESSION: 1. No acute or interval finding. 2. Remote right cerebral insult with bulging at a broad right craniectomy. Electronically Signed   By: Tiburcio Pea M.D.   On: 09/28/2023 05:44   EEG adult  Result Date: 09/27/2023 Bruce Quest, MD     09/27/2023  7:45 PM Patient Name: Bruce Little MRN:  [id number] Epilepsy Attending: Charlsie Little Referring Physician/Provider: Leatha Gilding, MD Date: 09/27/2023 Duration: 26.25 mins Patient history: 52 yo M with h/o seizure getting eeg to evaluate for seizure Level of alertness: Awake, asleep AEDs during EEG study: LEV Technical aspects: This EEG study was done with scalp electrodes positioned according to the 10-20 International system of electrode placement. Electrical activity was reviewed with band pass filter of 1-70Hz , sensitivity of 7 uV/mm, display speed of 80mm/sec with a 60Hz  notched filter applied as appropriate. EEG data were recorded continuously and digitally stored.  Video monitoring was available and reviewed as appropriate. Description: The posterior dominant rhythm consists of 8-9 Hz activity of moderate voltage (25-35 uV) seen predominantly in posterior head regions, symmetric and reactive to eye opening and eye closing. Sleep was characterized by vertex waves, sleep spindles (12 to 14 Hz), maximal frontocentral region. EEG showed continuous polymorphic sharply contoured 3 to 6 Hz theta-delta slowing in right hemisphere. There was also overriding 12-14Hz  beta activity in right centro-parietal region consistent with breach artifact. Hyperventilation and photic stimulation were not performed.   ABNORMALITY - Breach artifact, right centro-parietal region - Continuous slow, right hemisphere IMPRESSION: This study is suggestive of cortical dysfunction arising from right centro-parietal region  consistent with underlying craniotomy. Additionally there is cortical dysfunction in right hemisphere likely secondary to underlying stroke. No seizures were seen throughout the recording. Priyanka Annabelle Harman   Medications:  anticoagulant sodium citrate      amiodarone  100 mg Oral Daily   amLODipine  10 mg Oral Daily   ascorbic acid  500 mg Oral BID   atorvastatin  40 mg Oral QHS   carvedilol  25 mg Oral BID WC   Chlorhexidine Gluconate Cloth  6 each  Topical Q0600   cloNIDine  0.1 mg Oral TID   darbepoetin (ARANESP) injection - DIALYSIS  60 mcg Subcutaneous Q Sat-1800   feeding supplement  237 mL Oral BID BM   finasteride  5 mg Oral Daily   hydrALAZINE  25 mg Oral Q8H   influenza vac split trivalent PF  0.5 mL Intramuscular Tomorrow-1000   insulin aspart  0-5 Units Subcutaneous QHS   insulin aspart  0-6 Units Subcutaneous TID WC   insulin glargine-yfgn  5 Units Subcutaneous Daily   leptospermum manuka honey  1 Application Topical Daily   levETIRAcetam  1,500 mg Oral BID   liver oil-zinc oxide   Topical BID   losartan  50 mg Oral Daily   multivitamin  1 tablet Oral QHS   nutrition supplement (JUVEN)  1 packet Oral BID BM   pantoprazole  40 mg Oral BID   PARoxetine  10 mg Oral Daily   sodium chloride flush  3 mL Intravenous Q12H   sodium hypochlorite   Topical 1 day or 1 dose   terazosin  1 mg Oral QHS   vitamin A  50,000 Units Oral Daily   Vitamin D (Ergocalciferol)  50,000 Units Oral Q7 days     TTS - [place]  4hrs 500/AF 1.5,  EDW 98.6kg, 3.0 K/ 2.5Ca TDC  -No Heparin  - Mircera 50 mcg q 2 weeks - Not started yet - Hectorol IV TIW    Assessment/Plan: Vomiting - Daily for months, now resolved per patient, G tube malfunction.  Noted IR 11/11 consult "both lumens flush now easily."  Management per admit team tolerating bolus feedings currently.  ALB less than 1.5 dietary consulted getting supplements Hyponatremia-130> 132 timproved after UF on HD. Suspect this is a chronic issue. Follow labs.   ESRD -  On HD TTS. Started during prolonged hospitalization but previously followed for CKD stage 5 planning for HD.  Suspect muscle wasting causing falsely low SCr. Next HD 09/30/2023 Leukocytosis - WBC RESOLVING WORKUP PER ADMIT AND ID consulted.  UTI suspected, ABX started.  Blood cultures obtained.  Has sacral decub status post 11/11 surgical bedside debridement, wound care following, "wife would like attempt for wound VAC  replacement prior to discharge if possible " reattempt placement 09/27/23 surgery team per admit team Peg tube malfunction - IR  consulted.  As above.  And appears functional now further plans per PMD  Hypertension/volume  - BP in goal to slightly lowish past 24 hours, on multiple meds, can be titrating with HD UF.  Does not appear grossly overloaded.  Pleural effusions noted on CT. Variable weights here. Continue UF with HD as tolerated.   Anemia of CKD -Hgb 7.6 s/p 1 unit PRBC. Aranesp 60 mcg weekly.Increase dose 09/28/2023 to 100 mcg SQ. Follow HGB.   Secondary Hyperparathyroidism -  CCa  10.2 , Phos in the 1's improved today 3.2 with pharmacy help supplementing phos dosing.   Nutrition - Regular diet, follow labs. Alb<1.5.  Low phosphorus, K okay add protein supplements.  Dietary consulted by admit team Hx hemorrhagic stroke -left-sided hemiparesis -Rx Per admit team/noted previous rehab center admit and was at home prior to this admission Hypokalemia -resolved. Follow labs.     Searra Carnathan H. Raymar Joiner NP-C 09/28/2023, 9:25 AM  [employer] [phone]

## 2023-09-28 NOTE — TOC Progression Note (Signed)
Transition of Care [facility]) - Progression Note    Patient Details  Name: Bruce Little MRN: [id number] Date of Birth: 10/26/1971  [facility] CM/SW Contact  Tom-Johnson, Hershal Coria, RN Phone Number: 09/28/2023, 3:42 PM  Clinical Narrative:     Patient completed IV abx on 09/25/23. Patient's Janina Mayo now De-cannulated, on [employer]. Continues inpatient HD.   CM continues to following.    Expected Discharge Plan and Services                                               Social Determinants of Health (SDOH) Interventions SDOH Screenings   Food Insecurity: No Food Insecurity (09/17/2023)  Housing: Low Risk  (09/17/2023)  Transportation Needs: No Transportation Needs (09/17/2023)  Utilities: Not At Risk (09/17/2023)  Depression (PHQ2-9): Low Risk  (08/27/2021)  Financial Resource Strain: Low Risk  (08/10/2023)   Received from [facility]  Social Connections: Unknown (08/13/2023)   Received from [facility]  Stress: No Stress Concern Present (08/10/2023)   Received from [facility]  Tobacco Use: Low Risk  (09/20/2023)    Readmission Risk Interventions    09/22/2023   11:18 AM 07/11/2021    3:04 PM  Readmission Risk Prevention Plan  Transportation Screening Complete Complete  PCP or Specialist Appt within 5-7 Days Complete Complete  Home Care Screening Complete Complete  Medication Review (RN CM) Referral to Pharmacy Complete

## 2023-09-28 NOTE — Progress Notes (Addendum)
PROGRESS NOTE  Bruce Little OZH:[id number] DOB: [birth year] DOA: 09/17/2023 PCP: Westley Hummer, MD   LOS: 11 days   Brief Narrative / Interim history: This is a 52 year old male with DM, PAF on Eliquis in the past, CKD now on dialysis, left BKA who comes into the hospital with intermittent nausea and vomiting, fever, and clogged PEG tube.  He has had a prolonged hospitalization at [facility] in [place] in August, admitted there 04/30/2023 and discharged 07/01/2023.  Hospital course reviewed.  He was visiting [place] from [place], was in a hotel when he was found to have altered mental status, vomiting.  He was found to be hypertensive in the ER with a blood pressure of 226/100, and a CT of the head showed 9.2 x 5.5 cm right frontal temporal parenchymal bleed with edema, mass effect and 1.1 cm left midline shift.  He is status post craniectomy and hematoma evacuation and EVD placement.  Hospital course complicated by Staph epidermidis in the CSF 7/17, will repeat growth 7/22 and 7/24.  Eventually EVD was removed, and there were no plans to replace the bone flap and will need artificial plate eventually.  He will developed sacral decubitus ulcer and underwent serial debridements, sacral wounds grew E. coli, Morganella and Enterococcus faecalis and placed of antibiotics for several weeks.  Hospital course was also complicated by C. difficile diarrhea status post full course of vancomycin while being on IV antibiotics also, and in addition, had a PEG and a trach and developed renal failure requiring dialysis.  Following [place] hospitalization, [facility] and he was there August 22 through August 10, 2023.  Worth mentioning is that he was decannulated on 07/23/2023, and weaned off to room air, and also with ongoing SLP he was started on p.o. intake along with his PEG tube.  He has been in rehab through October, but apparently has been home for couple of  weeks prior to being here.  Subjective / 24h Interval events: Doing well this morning.  No specific complaints  Assesement and Plan: Principal problem SIRS with fever, leukocytosis -patient was febrile to 100.7 on admission, and had leukocytosis.  He was placed on antibiotics, and urine cultures eventually grew Pseudomonas.  ID consulted, he was treated and has completed antibiotics on 11/16.  Remains afebrile  Active problems History of PAF-currently in sinus rhythm.  He is on amiodarone, Coreg, continue.  No longer on anticoagulation following his ICH in August  Nausea, vomiting, diarrhea induced by tube feeds-tells me he has been having some nausea but no further vomiting.  Will discus with the wife about reinitiation of tube feeds and maybe use Imodium -Discussed with wife at bedside, she wishes for patient to have p.o. 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  CT scan done on admission does not show any evidence of recurrent bleed.  He has established with local neurosurgery to have a plate fitted -Patient had an episode of right arm involuntary movement, concern for seizure.  Repeat CT scan unremarkable.  EEG unremarkable.  Of note, I saw him within a minute following episode and he had no postictal confusion  ESRD-nephrology following, getting dialysis while here, session pending today  Chronic urinary retention-with chronic Foley  Stage IV sacral decubitus ulcer, POA-CT scan this admission showed resorption of the inferior sacrum and coccyx without abscess.  ID consulted as well.  Underwent debridement by general surgery in  August, and no further debridements were recommended following his [facility] stay -Surgery consulted, underwent bedside debridement 11/11.  Has had a wound VAC placed, however has been having diarrhea when he was resumed on his tube feeds.  And now has a wound VAC.  Hyponatremia-in the setting of renal  disease  Hypokalemia-continue to monitor and replenish as indicated  Vitamin D deficiency-start supplementation  Anemia-of chronic renal disease as well as chronic illness.  Transfuse unit of packed red blood cells for hemoglobin of 6.7.  Hemoglobin improved appropriately, today CBC pending  Status post trach-now decannulated, on room air  History of seizures-continue Keppra  Essential hypertension-continue antihypertensives as below, blood pressure stable  Type 2 diabetes mellitus-overall stable CBGs  Lab Results  Component Value Date   HGBA1C 5.1 09/18/2023   CBG (last 3)  Recent Labs    09/27/23 1650 09/27/23 2034 09/28/23 0804  GLUCAP 125* 159* 95    Scheduled Meds:  amiodarone  100 mg Oral Daily   amLODipine  10 mg Oral Daily   ascorbic acid  500 mg Oral BID   atorvastatin  40 mg Oral QHS   carvedilol  25 mg Oral BID WC   Chlorhexidine Gluconate Cloth  6 each Topical Q0600   cloNIDine  0.1 mg Oral TID   [START ON 10/02/2023] darbepoetin (ARANESP) injection - DIALYSIS  100 mcg Subcutaneous Q Sat-1800   feeding supplement  237 mL Oral BID BM   finasteride  5 mg Oral Daily   hydrALAZINE  25 mg Oral Q8H   influenza vac split trivalent PF  0.5 mL Intramuscular Tomorrow-1000   insulin aspart  0-5 Units Subcutaneous QHS   insulin aspart  0-6 Units Subcutaneous TID WC   insulin glargine-yfgn  5 Units Subcutaneous Daily   leptospermum manuka honey  1 Application Topical Daily   levETIRAcetam  1,500 mg Oral BID   liver oil-zinc oxide   Topical BID   losartan  50 mg Oral Daily   multivitamin  1 tablet Oral QHS   nutrition supplement (JUVEN)  1 packet Oral BID BM   pantoprazole  40 mg Oral BID   PARoxetine  10 mg Oral Daily   sodium chloride flush  3 mL Intravenous Q12H   sodium hypochlorite   Topical 1 day or 1 dose   terazosin  1 mg Oral QHS   vitamin A  50,000 Units Oral Daily   Vitamin D (Ergocalciferol)  50,000 Units Oral Q7 days   Continuous Infusions:   anticoagulant sodium citrate      PRN Meds:.acetaminophen **OR** acetaminophen, albuterol, alteplase, anticoagulant sodium citrate, food thickener, heparin, melatonin, methocarbamol, ondansetron (ZOFRAN) IV, polyethylene glycol, prochlorperazine, sorbitol  Current Outpatient Medications  Medication Instructions   albuterol (VENTOLIN HFA) 108 (90 Base) MCG/ACT inhaler 2 puffs, Inhalation, Every 4 hours PRN   amiodarone (PACERONE) 100 mg, Oral, Daily   amLODipine (NORVASC) 10 mg, Oral, Daily   atorvastatin (LIPITOR) 80 mg, Oral, Every evening   atorvastatin (LIPITOR) 40 mg, Oral, Daily at bedtime   carvedilol (COREG) 37.5 mg, Oral, 2 times daily with meals   cloNIDine (CATAPRES) 0.3 mg, Oral, 3 times daily   finasteride (PROSCAR) 5 mg, Oral, Daily   furosemide (LASIX) 40 mg, Oral, Daily   glipiZIDE (GLUCOTROL) 5 mg, Oral, Daily   hydrALAZINE (APRESOLINE) 25 mg, Oral, Every 8 hours   Lantus SoloStar 5 Units, Subcutaneous, Every 12 hours   levETIRAcetam (KEPPRA) 1,500 mg, Oral, 2 times daily   losartan (COZAAR) 100 mg, Oral,  Daily   Melatonin 10 MG TABS 1 tablet, Oral, Daily at bedtime   ondansetron (ZOFRAN) 4 mg, Oral, Every 8 hours PRN   pantoprazole (PROTONIX) 40 mg, Oral, Daily   PARoxetine (PAXIL) 10 mg, Oral, Daily   terazosin (HYTRIN) 1 mg, Oral, Daily at bedtime    Diet Orders (From admission, onward)     Start     Ordered   09/22/23 1531  DIET DYS 3 Room service appropriate? Yes with Assist; Fluid consistency: Nectar Thick  Diet effective now       Comments: May have sips of thin liquids in between meals  Question Answer Comment  Room service appropriate? Yes with Assist   Fluid consistency: Nectar Thick      09/22/23 1531            DVT prophylaxis: SCDs Start: 09/17/23 2336   Lab Results  Component Value Date   PLT 253 09/28/2023      Code Status: Full Code  Family Communication: Discussed with wife [name]  Status is: Inpatient Remains inpatient  appropriate because: severity of illness  Level of care: Telemetry Medical  Consultants:  Nephrology ID  Objective: Vitals:   09/28/23 0932 09/28/23 1000 09/28/23 1030 09/28/23 1100  BP: 131/73 111/74 123/75 127/80  Pulse: 76 72 74 80  Resp: 20 15 16 17   Temp:      TempSrc:      SpO2: 98% 98% 97% 97%  Weight:      Height:        Intake/Output Summary (Last 24 hours) at 09/28/2023 1110 Last data filed at 09/28/2023 0603 Gross per 24 hour  Intake --  Output 1200 ml  Net -1200 ml   Wt Readings from Last 3 Encounters:  09/28/23 90.7 kg  04/14/23 111.1 kg  10/29/21 86.1 kg    Examination:  Constitutional: NAD Eyes: lids and conjunctivae normal, no scleral icterus ENMT: mmm Neck: normal, supple Respiratory: clear to auscultation bilaterally, no wheezing, no crackles. Normal respiratory effort.  Cardiovascular: Regular rate and rhythm, no murmurs / rubs / gallops. No LE edema. Abdomen: soft, no distention, no tenderness. Bowel sounds positive.   Data Reviewed: I have independently reviewed following labs and imaging studies   CBC Recent Labs  Lab 09/22/23 1631 09/23/23 0855 09/25/23 0640 09/27/23 0440 09/28/23 0535  WBC 10.8* 12.4* 8.2 10.5 9.3  HGB 7.4* 7.3* 7.5* 7.8* 7.6*  HCT 22.3* 22.1* 22.9* 23.9* 22.5*  PLT 196 189 199 227 253  MCV 96.1 97.4 95.4 96.4 94.9  MCH 31.9 32.2 31.3 31.5 32.1  MCHC 33.2 33.0 32.8 32.6 33.8  RDW 15.2 14.8 14.9 15.1 15.1    Recent Labs  Lab 09/21/23 1802 09/22/23 1631 09/23/23 0600 09/23/23 0855 09/24/23 0636 09/25/23 0640 09/27/23 0440 09/28/23 0535  NA  --  131*  --  130* 132* 132* 130* 131*  K  --  3.9  --  4.3 4.2 4.6 4.4 4.5  CL  --  97*  --  98 98 100 100 102  CO2  --  26  --  26 25 25 24 23   GLUCOSE  --  339*  --  272* 134* 126* 112* 102*  BUN  --  15  --  19 11 15  28* 31*  CREATININE  --  1.84*  --  2.38* 1.95* 2.45* 2.42* 2.80*  CALCIUM  --  7.0*  --  7.1* 7.7* 8.1* 8.0* 8.2*  AST  --  31  --   --   --    --   --   --  ALT  --  30  --   --   --   --   --   --   ALKPHOS  --  116  --   --   --   --   --   --   BILITOT  --  0.3  --   --   --   --   --   --   ALBUMIN  --  <1.5*  --  <1.5* <1.5* <1.5*  --  1.5*  MG 1.4* 1.4* 1.4*  --   --   --   --  1.7    ------------------------------------------------------------------------------------------------------------------ No results for input(s): "CHOL", "HDL", "LDLCALC", "TRIG", "CHOLHDL", "LDLDIRECT" in the last 72 hours.  Lab Results  Component Value Date   HGBA1C 5.1 09/18/2023   ------------------------------------------------------------------------------------------------------------------ No results for input(s): "TSH", "T4TOTAL", "T3FREE", "THYROIDAB" in the last 72 hours.  Invalid input(s): "FREET3"  Cardiac Enzymes No results for input(s): "CKMB", "TROPONINI", "MYOGLOBIN" in the last 168 hours.  Invalid input(s): "CK" ------------------------------------------------------------------------------------------------------------------ No results found for: "BNP"  CBG: Recent Labs  Lab 09/27/23 0738 09/27/23 1133 09/27/23 1650 09/27/23 2034 09/28/23 0804  GLUCAP 99 138* 125* 159* 95    No results found for this or any previous visit (from the past 240 hour(s)).    Radiology Studies: CT HEAD WO CONTRAST ( )  Result Date: 09/28/2023 CLINICAL DATA:  Stroke follow-up EXAM: CT HEAD WITHOUT CONTRAST TECHNIQUE: Contiguous axial images were obtained from the base of the skull through the vertex without intravenous contrast. RADIATION DOSE REDUCTION: This exam was performed according to the departmental dose-optimization program which includes automated exposure control, adjustment of the mA and/or kV according to patient size and/or use of iterative reconstruction technique. COMPARISON:  Nine days prior FINDINGS: Brain: Extensive encephalomalacia involving the right cerebral hemisphere with broad craniectomy. Although bulging  appearance of the brain in some areas towards the vertex suspicious for vasogenic edema, there is overall volume loss and white matter low-density is primarily attributed to encephalomalacia. No evidence of acute infarct, acute hemorrhage, hydrocephalus, mass, or collection. There is cerebral volume loss and ventriculomegaly. Vascular: No hyperdense vessel or unexpected calcification. Skull: Broad craniectomy on the right. Sinuses/Orbits: Negative IMPRESSION: 1. No acute or interval finding. 2. Remote right cerebral insult with bulging at a broad right craniectomy. Electronically Signed   By: Tiburcio Pea M.D.   On: 09/28/2023 05:44   EEG adult  Result Date: 09/27/2023 Charlsie Quest, MD     09/27/2023  7:45 PM Patient Name: NATHIAN CALAHAN MRN: [id number] Epilepsy Attending: Charlsie Quest Referring Physician/Provider: Leatha Gilding, MD Date: 09/27/2023 Duration: 26.25 mins Patient history: 52 yo M with h/o seizure getting eeg to evaluate for seizure Level of alertness: Awake, asleep AEDs during EEG study: LEV Technical aspects: This EEG study was done with scalp electrodes positioned according to the 10-20 International system of electrode placement. Electrical activity was reviewed with band pass filter of 1-70Hz , sensitivity of 7 uV/mm, display speed of 44mm/sec with a 60Hz  notched filter applied as appropriate. EEG data were recorded continuously and digitally stored.  Video monitoring was available and reviewed as appropriate. Description: The posterior dominant rhythm consists of 8-9 Hz activity of moderate voltage (25-35 uV) seen predominantly in posterior head regions, symmetric and reactive to eye opening and eye closing. Sleep was characterized by vertex waves, sleep spindles (12 to 14 Hz), maximal frontocentral region. EEG showed continuous polymorphic sharply contoured 3 to 6 Hz theta-delta slowing in right  hemisphere. There was also overriding 12-14Hz  beta activity in right centro-parietal  region consistent with breach artifact. Hyperventilation and photic stimulation were not performed.   ABNORMALITY - Breach artifact, right centro-parietal region - Continuous slow, right hemisphere IMPRESSION: This study is suggestive of cortical dysfunction arising from right centro-parietal region  consistent with underlying craniotomy. Additionally there is cortical dysfunction in right hemisphere likely secondary to underlying stroke. No seizures were seen throughout the recording. Priyanka Thelma Comp, MD, PhD [facility]  Between 7 am - 7 pm I am available, please contact me via Amion (for emergencies) or Securechat (non urgent messages)  Between 7 pm - 7 am I am not available, please contact night coverage MD/APP via Amion

## 2023-09-29 ENCOUNTER — Inpatient Hospital Stay (HOSPITAL_COMMUNITY): Payer: BC Managed Care – PPO

## 2023-09-29 DIAGNOSIS — N186 End stage renal disease: Secondary | ICD-10-CM

## 2023-09-29 DIAGNOSIS — R1110 Vomiting, unspecified: Secondary | ICD-10-CM | POA: Diagnosis not present

## 2023-09-29 LAB — GLUCOSE, CAPILLARY
Glucose-Capillary: 116 mg/dL — ABNORMAL HIGH (ref 70–99)
Glucose-Capillary: 110 mg/dL — ABNORMAL HIGH (ref 70–99)
Glucose-Capillary: 130 mg/dL — ABNORMAL HIGH (ref 70–99)
Glucose-Capillary: 176 mg/dL — ABNORMAL HIGH (ref 70–99)
Glucose-Capillary: 198 mg/dL — ABNORMAL HIGH (ref 70–99)

## 2023-09-29 NOTE — Progress Notes (Addendum)
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  Following [place] hospitalization, [facility] and was [facility] August 22 through August 10, 2023.  Worth mentioning is that he was decannulated on 07/23/2023, and weaned off to room air; He has been in rehab through October, but apparently has been home for couple of weeks prior to this admission    PT Comments  Continuing work on functional mobility and activity tolerance;  Session focused on OOB to recliner transfers, positioning considerations, and most importantly, connecting with [name], Kymoni's wife, re: concerns/questions as we get closer to getting [place] home; She tells me their current wheelchair is a Development worker, community from [employer], and that they were getting the new one delivered to their home this week - until this hospital admission; Asked Daphne, RN with TOC about connecting with [employer] to see if they can come here with pt's new wheels, appreciate Daphne's assistance;   Connected with [place] Sink, NP with Nephrology and [place], [place] REnal Navigator to request that pt's cushion be used in HD recliner (here and at outpt HD) to help with pressure redistribution and wound healing;   Discussed considerations for transition home with Dr. Elvera Lennox; I believe [place] and [name] will benefit from continued Physical Medicine andRehabilitation. Follow up after discharge, and appreciate Dr. Elvera Lennox putting in a PM&R consult;  For discharge home, rec HHPT, OT, RN, Aide; I'm also wondering, if dietitians can provide home health visits, to keep track of his  nutritional status, with a particular eye to wound healing;  Once [place] was up in the chair, we took serial blood pressures starting at near max tilt, posteriorly, and incrementally coming closer to upright; [place] showed the need for slow progression from reclined to upright, but it is promising that with increased time the initial drop in blood pressure with the change in position did stabilize; blood pressures were as follows   09/29/23 1100 09/29/23 1101 09/29/23 1102  Orthostatic Sitting  BP- Sitting 114/66 (MAP 81) 107/61 (MAP 75) 94/62 (MAP 72)  Pulse- Sitting 73 (in near full tilt back in WC) 75 (tilted Quarter of the way upright) 74 (tilted halfway to upright; reporting dizziness)    09/29/23 1110  Orthostatic Sitting  BP- Sitting 105/68 (MAP 79)  Pulse- Sitting 73 (tilted halfway to upright; less dizziness)      If plan is discharge home, recommend the following: A lot of help with walking and/or transfers;A lot of help with bathing/dressing/bathroom   Can travel by private vehicle        Equipment Recommendations  Other (comment);Wheelchair (measurements PT);Wheelchair cushion (measurements PT) (Overall well-equipped; Hopeful that WC Vendor can come to hospital to trade out loaner [facility] the one they will keep; appreciate TOC work on Interior and spatial designer)    Recommendations for [employer] Other (comment) (Connect with PM&R for follow up)     Precautions / Restrictions Precautions Precautions: Fall;Other (comment) Precaution Comments: Crani with bone flap still out; helmet when OOB; reported dizziness sitting EOB, consider supine and sitting BPs     Mobility  Bed Mobility Overal bed mobility: Needs Assistance Bed Mobility: Rolling Rolling: Mod assist, Max  assist         General bed mobility comments: Light mod assist to roll to L side; Max assist to roll R    Transfers Overall transfer level: Needs assistance Equipment used: Ambulation equipment  used Transfers: Bed to chair/wheelchair/BSC             General transfer comment: Maximove transfer bed to pt's wheelchair Transfer via Lift Equipment: Maximove  Ambulation/Gait                   Stairs             Wheelchair Mobility     Tilt Bed    Modified Rankin (Stroke Patients Only)       Balance     Sitting balance-Leahy Scale: Poor                                      Cognition Arousal: Alert Behavior During Therapy: WFL for tasks assessed/performed, Flat affect Overall Cognitive Status: History of cognitive impairments - at baseline                                 General Comments: able to follow commands and answer questions consistently and fully participate, though less talkative today        Exercises      General Comments General comments (skin integrity, edema, etc.): Pt's wife, [name], present and helpful; We discussed concerns re: home management      Pertinent Vitals/Pain Pain Assessment Pain Assessment: No/denies pain Pain Intervention(s): Monitored during session    Home Living                          Prior Function            PT Goals (current goals can now be found in the care plan section) Acute Rehab PT Goals Patient Stated Goal: Agreeable to working with PT PT Goal Formulation: Patient unable to participate in goal setting Time For Goal Achievement: 10/05/23 Potential to Achieve Goals: Fair Progress towards PT goals: Progressing toward goals    Frequency    Min 1X/week      PT Plan      Co-evaluation              AM-PAC PT "6 Clicks" Mobility   Outcome Measure  Help needed turning from your back to your side while in a flat bed without using bedrails?: A Lot Help needed moving from lying on your back to sitting on the side of a flat bed without using bedrails?: Total Help needed moving to and from a bed to a chair (including a wheelchair)?:  Total Help needed standing up from a chair using your arms (e.g., wheelchair or bedside chair)?: Total Help needed to walk in hospital room?: Total Help needed climbing 3-5 steps with a railing? : Total 6 Click Score: 7    End of Session Equipment Utilized During Treatment: Other (comment) (Maximove) Activity Tolerance: Patient tolerated treatment well Patient left: in chair;with call bell/phone within reach (lap seatbelt and chest belt fastened) Nurse Communication: Mobility status (and can switch beds; Also VAC alarming) PT Visit Diagnosis: Other abnormalities of gait and mobility (R26.89);Other symptoms and signs involving the nervous system (R29.898);Hemiplegia and hemiparesis Hemiplegia - Right/Left: Left Hemiplegia - caused by:  Cerebral infarction     Time: 1040-1130 PT Time Calculation (min) (ACUTE ONLY): 50 min  Charges:    $Therapeutic Activity: 23-37 mins $Self Care/Home Management: 8-22 PT General Charges $$ ACUTE PT VISIT: 1 Visit                     Van Clines, PT  Acute Rehabilitation Services Office [phone] Secure Chat welcomed    [name] 09/29/2023, 1:30 PM

## 2023-09-29 NOTE — Progress Notes (Addendum)
[facility] Progress Note   Subjective: Patient seen lying in bed. Wife at bedside. Apparently patient is having issues sitting in chair. Will order that his WC cushion be placed in HD chair q treatment.   Conversation continued and wife is interested in home therapies. We discussed both PD and HD modalities. She asks about placement of AVF or AVG. Educated wife that permanent access is a requirement for home HD. She is interested in permanent access placement and gives permission to consult [facility] for permanent access. She is quite realistic and thinks that home therapy would a preferred option. I have reached out to medical director of HD center (Dr. Valentino Nose) and made him aware of conversation. We will proceed with [facility] while pt is still in hospital.    Attending aware of conversation above and present. Pt is stable for discharge but can stay overnight if permanent access can be placed.   Objective Vitals:   09/28/23 2045 09/29/23 0455 09/29/23 0603 09/29/23 0919  BP: 103/60 123/70  121/81  Pulse: 73 73  70  Resp: 18 20  18   Temp: 98 F (36.7 C) 98.3 F (36.8 C)  97.7 F (36.5 C)  TempSrc: Oral Oral  Oral  SpO2: 96% 96%    Weight:   84.2 kg   Height:       Physical Exam General: Chronically ill appearing male in NAD Heart: RRR Lungs: CTAB No WOB Abdomen: Peg tube. 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 Sacral wound not visible with wound vac Dialysis Access: RIJ TDC drsg intact   Additional Objective Labs: Basic Metabolic Panel: Recent Labs  Lab 09/25/23 0421 09/25/23 0640 09/27/23 0440 09/28/23 0535  NA  --  132* 130* 131*  K  --  4.6 4.4 4.5  CL  --  100 100 102  CO2  --  25 24 23   GLUCOSE  --  126* 112* 102*  BUN  --  15 28* 31*  CREATININE  --  2.45* 2.42* 2.80*  CALCIUM  --  8.1* 8.0* 8.2*  PHOS 3.2 3.2  --  3.7   Liver Function Tests: Recent Labs  Lab 09/22/23 1631 09/23/23 0855 09/24/23 0636 09/25/23 0640 09/28/23 0535  AST 31  --    --   --   --   ALT 30  --   --   --   --   ALKPHOS 116  --   --   --   --   BILITOT 0.3  --   --   --   --   PROT 4.5*  --   --   --   --   ALBUMIN <1.5*   < > <1.5* <1.5* 1.5*   < > = values in this interval not displayed.   No results for input(s): "LIPASE", "AMYLASE" in the last 168 hours. CBC: Recent Labs  Lab 09/22/23 1631 09/23/23 0855 09/25/23 0640 09/27/23 0440 09/28/23 0535  WBC 10.8* 12.4* 8.2 10.5 9.3  HGB 7.4* 7.3* 7.5* 7.8* 7.6*  HCT 22.3* 22.1* 22.9* 23.9* 22.5*  MCV 96.1 97.4 95.4 96.4 94.9  PLT 196 189 199 227 253   Blood Culture    Component Value Date/Time   SDES BLOOD RIGHT ARM 09/17/2023 2024   SPECREQUEST  09/17/2023 2024    BOTTLES DRAWN AEROBIC AND ANAEROBIC Blood Culture adequate volume   CULT  09/17/2023 2024    NO GROWTH 5 DAYS Performed at [facility], [address], [place],  [place] [address]    REPTSTATUS 09/22/2023 FINAL 09/17/2023 2024    Cardiac Enzymes: No results for input(s): "CKTOTAL", "CKMB", "CKMBINDEX", "TROPONINI" in the last 168 hours. CBG: Recent Labs  Lab 09/28/23 1241 09/28/23 1710 09/28/23 2047 09/29/23 0221 09/29/23 0704  GLUCAP 100* 111* 156* 116* 110*   Iron Studies: No results for input(s): "IRON", "TIBC", "TRANSFERRIN", "FERRITIN" in the last 72 hours. @lablastinr3 @ Studies/Results: CT HEAD WO CONTRAST ( )  Result Date: 09/28/2023 CLINICAL DATA:  Stroke follow-up EXAM: CT HEAD WITHOUT CONTRAST TECHNIQUE: Contiguous axial images were obtained from the base of the skull through the vertex without intravenous contrast. RADIATION DOSE REDUCTION: This exam was performed according to the departmental dose-optimization program which includes automated exposure control, adjustment of the mA and/or kV according to patient size and/or use of iterative reconstruction technique. COMPARISON:  Nine days prior FINDINGS: Brain: Extensive encephalomalacia involving the right cerebral hemisphere with broad craniectomy.  Although bulging appearance of the brain in some areas towards the vertex suspicious for vasogenic edema, there is overall volume loss and white matter low-density is primarily attributed to encephalomalacia. No evidence of acute infarct, acute hemorrhage, hydrocephalus, mass, or collection. There is cerebral volume loss and ventriculomegaly. Vascular: No hyperdense vessel or unexpected calcification. Skull: Broad craniectomy on the right. Sinuses/Orbits: Negative IMPRESSION: 1. No acute or interval finding. 2. Remote right cerebral insult with bulging at a broad right craniectomy. Electronically Signed   By: Tiburcio Pea M.D.   On: 09/28/2023 05:44   EEG adult  Result Date: 09/27/2023 Charlsie Quest, MD     09/27/2023  7:45 PM Patient Name: Bruce Little MRN: [id number] Epilepsy Attending: Charlsie Quest Referring Physician/Provider: Leatha Gilding, MD Date: 09/27/2023 Duration: 26.25 mins Patient history: 52 yo M with h/o seizure getting eeg to evaluate for seizure Level of alertness: Awake, asleep AEDs during EEG study: LEV Technical aspects: This EEG study was done with scalp electrodes positioned according to the 10-20 International system of electrode placement. Electrical activity was reviewed with band pass filter of 1-70Hz , sensitivity of 7 uV/mm, display speed of 61mm/sec with a 60Hz  notched filter applied as appropriate. EEG data were recorded continuously and digitally stored.  Video monitoring was available and reviewed as appropriate. Description: The posterior dominant rhythm consists of 8-9 Hz activity of moderate voltage (25-35 uV) seen predominantly in posterior head regions, symmetric and reactive to eye opening and eye closing. Sleep was characterized by vertex waves, sleep spindles (12 to 14 Hz), maximal frontocentral region. EEG showed continuous polymorphic sharply contoured 3 to 6 Hz theta-delta slowing in right hemisphere. There was also overriding 12-14Hz  beta activity in  right centro-parietal region consistent with breach artifact. Hyperventilation and photic stimulation were not performed.   ABNORMALITY - Breach artifact, right centro-parietal region - Continuous slow, right hemisphere IMPRESSION: This study is suggestive of cortical dysfunction arising from right centro-parietal region  consistent with underlying craniotomy. Additionally there is cortical dysfunction in right hemisphere likely secondary to underlying stroke. No seizures were seen throughout the recording. Priyanka Annabelle Harman   Medications:   amiodarone  100 mg Oral Daily   amLODipine  10 mg Oral Daily   ascorbic acid  500 mg Oral BID   atorvastatin  40 mg Oral QHS   carvedilol  25 mg Oral BID WC   Chlorhexidine Gluconate Cloth  6 each Topical Q0600   cloNIDine  0.1 mg Oral TID   [START ON 10/02/2023] darbepoetin (ARANESP) injection - DIALYSIS  100 mcg Subcutaneous Q  Sat-1800   feeding supplement  237 mL Oral BID BM   finasteride  5 mg Oral Daily   hydrALAZINE  25 mg Oral Q8H   influenza vac split trivalent PF  0.5 mL Intramuscular Tomorrow-1000   insulin aspart  0-5 Units Subcutaneous QHS   insulin aspart  0-6 Units Subcutaneous TID WC   insulin glargine-yfgn  5 Units Subcutaneous Daily   leptospermum manuka honey  1 Application Topical Daily   levETIRAcetam  1,500 mg Oral BID   losartan  50 mg Oral Daily   multivitamin  1 tablet Oral QHS   nutrition supplement (JUVEN)  1 packet Oral BID BM   pantoprazole  40 mg Oral BID   PARoxetine  10 mg Oral Daily   sodium chloride flush  3 mL Intravenous Q12H   terazosin  1 mg Oral QHS   vitamin A  50,000 Units Oral Daily   Vitamin D (Ergocalciferol)  50,000 Units Oral Q7 days     TTS - [place]  4hrs 500/AF 1.5,  EDW 98.6kg, 3.0 K/ 2.5Ca TDC  -No Heparin  - Mircera 50 mcg q 2 weeks - Not started yet - Hectorol IV TIW    Assessment/Plan: Vomiting - Daily for months, now resolved per patient, G tube malfunction.  Noted IR 11/11 consult  "both lumens flush now easily."  Management per admit team tolerating bolus feedings currently.  ALB less than 1.5 dietary consulted getting supplements Hyponatremia-130> 132 timproved after UF on HD. Suspect this is a chronic issue. Follow labs.   ESRD -  On HD TTS. Started during prolonged hospitalization but previously followed for CKD stage 5 planning for HD.  Suspect muscle wasting causing falsely low SCr. Truncated HD 09/28/2023.  Next HD 09/30/2023 Leukocytosis - WBC RESOLVING WORKUP PER ADMIT AND ID consulted.  UTI suspected, ABX started.  Blood cultures obtained.  Has sacral decub status post 11/11 surgical bedside debridement, wound care following, "wife would like attempt for wound VAC replacement prior to discharge if possible " reattempt placement 09/27/23 surgery team per admit team Peg tube malfunction - IR  consulted.  As above.  And appears functional now further plans per PMD  Hypertension/volume  - BP in goal to slightly lowish past 24 hours, on multiple meds, can be titrating with HD UF.  Does not appear grossly overloaded.  Pleural effusions noted on CT. Variable weights here. Continue UF with HD as tolerated.   Anemia of CKD -Hgb 7.6 s/p 1 unit PRBC. Aranesp 60 mcg weekly.Increase dose 09/28/2023 to 100 mcg SQ. Follow HGB.   Secondary Hyperparathyroidism -  CCa  10.2 , Phos in the 1's improved today 3.2 with pharmacy help supplementing phos dosing.   Nutrition - Regular diet, follow labs. Alb<1.5.  Low phosphorus, K okay add protein supplements.  Dietary consulted by admit team Hx hemorrhagic stroke -left-sided hemiparesis -Rx Per admit team/noted previous rehab center admit and was at home prior to this admission Hypokalemia -resolved. Follow labs.  [facility] consult for permanent access. Patient's wife is interested in home HD. Dr. Valentino Nose is aware.     Chesney Klimaszewski H. Hildegarde Dunaway NP-C 09/29/2023, 10:59 AM  [employer] [phone]

## 2023-09-29 NOTE — TOC Progression Note (Signed)
Transition of Care [facility]) - Progression Note    Patient Details  Name: Bruce Little MRN: [id number] Date of Birth: 13-Nov-1970  [facility] CM/SW Contact  Tom-Johnson, Hershal Coria, RN Phone Number: 09/29/2023, 4:02 PM  Clinical Narrative:     CM informed by PT that wife is requesting [employer] to come to the hospital to adjust patient's current w/c to fit his needs. CM called [employer] [phone]) and spoke with Melissa. Melissa will reach out to supervisor to see if they could come to the hospital. Patient's wife updated and ok with them coming to the hospital and if not, they can adjust at his home.   CM consulted for Woundvac at home. CM contacted [facility] and spoke with French Ana ([phone]). French Ana states she will send Docusign orders to MD and will notify CM when to retrieve Woundvac from [facility].   Vascular consulted for possible AVF vs AV graft placement.   CM will continue to follow as patient progresses with care towards discharge.   Expected Discharge Plan and Services                                               Social Determinants of Health (SDOH) Interventions SDOH Screenings   Food Insecurity: No Food Insecurity (09/17/2023)  Housing: Low Risk  (09/17/2023)  Transportation Needs: No Transportation Needs (09/17/2023)  Utilities: Not At Risk (09/17/2023)  Depression (PHQ2-9): Low Risk  (08/27/2021)  Financial Resource Strain: Low Risk  (08/10/2023)   Received from [facility]  Social Connections: Unknown (08/13/2023)   Received from [facility]  Stress: No Stress Concern Present (08/10/2023)   Received from [facility]  Tobacco Use: Low Risk  (09/20/2023)    Readmission Risk Interventions    09/22/2023   11:18 AM 07/11/2021    3:04 PM  Readmission Risk Prevention Plan  Transportation Screening Complete Complete  PCP or Specialist Appt within 5-7 Days Complete Complete  Home Care Screening Complete Complete  Medication Review (RN CM)  Referral to Pharmacy Complete

## 2023-09-29 NOTE — Plan of Care (Signed)

## 2023-09-29 NOTE — Plan of Care (Signed)
  Problem: Coping: Goal: Ability to adjust to condition or change in health will improve 09/29/2023 0313 by Carylon Perches, LPN Outcome: Progressing 09/29/2023 0312 by Carylon Perches, LPN Outcome: Progressing   Problem: Fluid Volume: Goal: Ability to maintain a balanced intake and output will improve 09/29/2023 0313 by Carylon Perches, LPN Outcome: Progressing 09/29/2023 0312 by Carylon Perches, LPN Outcome: Progressing   Problem: Health Behavior/Discharge Planning: Goal: Ability to identify and utilize available resources and services will improve 09/29/2023 0313 by Carylon Perches, LPN Outcome: Progressing 09/29/2023 0312 by Carylon Perches, LPN Outcome: Progressing Goal: Ability to manage health-related needs will improve 09/29/2023 0313 by Carylon Perches, LPN Outcome: Progressing 09/29/2023 0312 by Carylon Perches, LPN Outcome: Progressing   Problem: Metabolic: Goal: Ability to maintain appropriate glucose levels will improve 09/29/2023 0313 by Carylon Perches, LPN Outcome: Progressing 09/29/2023 0312 by Carylon Perches, LPN Outcome: Progressing   Problem: Nutritional: Goal: Maintenance of adequate nutrition will improve 09/29/2023 0313 by Carylon Perches, LPN Outcome: Progressing 09/29/2023 0312 by Carylon Perches, LPN Outcome: Progressing Goal: Progress toward achieving an optimal weight will improve 09/29/2023 0313 by Carylon Perches, LPN Outcome: Progressing 09/29/2023 0312 by Carylon Perches, LPN Outcome: Progressing   Problem: Skin Integrity: Goal: Risk for impaired skin integrity will decrease 09/29/2023 0313 by Carylon Perches, LPN Outcome: Progressing 09/29/2023 0312 by Carylon Perches, LPN Outcome: Progressing   Problem: Tissue Perfusion: Goal: Adequacy of tissue perfusion will improve 09/29/2023 0313 by Carylon Perches, LPN Outcome: Progressing 09/29/2023 0312 by Carylon Perches, LPN Outcome: Progressing    Problem: Clinical Measurements: Goal: Will remain free from infection Outcome: Progressing

## 2023-09-29 NOTE — Consult Note (Addendum)
Hospital Consult    Reason for Consult:  permanent dialysis access Requesting Physician:  Alonna Buckler NP MRN #:  [id number]  History of Present Illness: Bruce Little is a 52 y.o. male with a past medical history of A-fib, DMII, and CKD with recent progression into ESRD.  He has had a recent significant decline in his health over the past year.  While in [place] on vacation he had a hemorrhagic stroke.  He was hospitalized for an extended period of time and required craniectomy with hematoma evacuation.  His hospital course was complicated by CSF infection with Staph epidermidis, sacral decubitus ulcer requiring debridements and wound VAC therapy, C. difficile.,  Tracheostomy and PEG tube placement.  His anticoagulation has had to be held since his intracranial hemorrhage.  His tracheostomy was decannulated and he was weaned to room air prior to discharge in [place].  Since his admission the patient has been dealing with issues with N/V/D due to tube feeds.  He was septic upon admission to [facility] with Pseudomonas in his urine.  He has been afebrile and leukocytosis has been resolved since 11/16.  Most recently his sacral decubitus ulcer was debrided at bedside on 11/11.  He has a wound VAC to this area.  The patient has previously been seen by our office for dialysis access, prior to his health decline.  At that time he was a PD catheter candidate.  We were consulted now for possible AVF versus AV graft placement.   Past Medical History:  Diagnosis Date   A-fib (HCC)    Anemia    low iron   Chronic kidney disease    COVID    has had it 2 times, one mild and one wasn't   DM2 (diabetes mellitus, type 2) (HCC)    History of blood transfusion    HTN (hypertension)    Osteomyelitis of fifth toe of left foot (HCC) 08/13/2021   Osteomyelitis of fourth toe of left foot (HCC) 08/13/2021   Pneumonia    Stroke (HCC) 05/19/2021   no residual effects.   Stroke [facility])     Past Surgical History:   Procedure Laterality Date   AMPUTATION Left 08/29/2021   Procedure: AMPUTATION OF FOURTH TOE AND RAY ALONG WITH REMAINING FITH METATARSAL;  Surgeon: Tarry Kos, MD;  Location: [facility] OR;  Service: Orthopedics;  Laterality: Left;   AMPUTATION Left 09/03/2021   Procedure: LISFRANC AMPUTATION;  Surgeon: Tarry Kos, MD;  Location: [facility] OR;  Service: Orthopedics;  Laterality: Left;   AMPUTATION Left 10/29/2021   Procedure: AMPUTATION BELOW KNEE -LEFT;  Surgeon: Tarry Kos, MD;  Location: [facility] OR;  Service: Orthopedics;  Laterality: Left;   APPLICATION OF WOUND VAC Left 07/07/2021   Procedure: APPLICATION OF WOUND VAC;  Surgeon: Tarry Kos, MD;  Location: [facility] OR;  Service: Orthopedics;  Laterality: Left;   APPLICATION OF WOUND VAC Left 08/29/2021   Procedure: APPLICATION OF WOUND VAC;  Surgeon: Tarry Kos, MD;  Location: [facility] OR;  Service: Orthopedics;  Laterality: Left;   I & D EXTREMITY Left 07/03/2021   Procedure: IRRIGATION AND DEBRIDEMENT ,FIFTH RAY  AMPUTATION LEFT FOOT, , WOUND VAC PLACEMENT;  Surgeon: Tarry Kos, MD;  Location: [facility] OR;  Service: Orthopedics;  Laterality: Left;   I & D EXTREMITY Left 07/07/2021   Procedure: IRRIGATION AND DEBRIDEMENT LEFT FOOT;  Surgeon: Tarry Kos, MD;  Location: [facility];  Service: Orthopedics;  Laterality: Left;   I & D EXTREMITY Left  08/29/2021   Procedure: IRRIGATION AND DEBRIDEMENT LEFT FOOT;  Surgeon: Tarry Kos, MD;  Location: [facility];  Service: Orthopedics;  Laterality: Left;   IR FLUORO GUIDE CV LINE RIGHT  07/09/2021   IR REMOVAL TUN CV CATH W/O FL  10/08/2021   IR REMOVAL TUN CV CATH W/O FL  07/13/2023   IR REPLACE G-TUBE SIMPLE WO FLUORO  09/20/2023   IR US GUIDE VASC ACCESS RIGHT  07/09/2021   VITRECTOMY Left    [place] [facility]    No Known Allergies  Prior to Admission medications   Medication Sig Start Date End Date Taking? Authorizing Provider  albuterol (VENTOLIN HFA) 108 (90 Base) MCG/ACT inhaler Inhale 2 puffs into the lungs  every 4 (four) hours as needed. 09/15/23  Yes [provider]  amiodarone (PACERONE) 100 MG tablet Take 100 mg by mouth daily.   Yes [provider]  amLODipine (NORVASC) 10 MG tablet Take 10 mg by mouth daily.   Yes [provider]  atorvastatin (LIPITOR) 40 MG tablet Take 40 mg by mouth at bedtime.   Yes [provider]  carvedilol (COREG) 12.5 MG tablet Take 37.5 mg by mouth 2 (two) times daily with a meal.   Yes [provider]  cloNIDine (CATAPRES) 0.1 MG tablet Take 0.3 mg by mouth 3 (three) times daily.   Yes [provider]  finasteride (PROSCAR) 5 MG tablet Take 5 mg by mouth daily.   Yes [provider]  furosemide (LASIX) 40 MG tablet Take 40 mg by mouth daily.   Yes [provider]  hydrALAZINE (APRESOLINE) 25 MG tablet Take 25 mg by mouth every 8 (eight) hours.   Yes [provider]  LANTUS SOLOSTAR 100 UNIT/ML Solostar Pen Inject 5 Units into the skin every 12 (twelve) hours. 08/31/23  Yes [provider]  levETIRAcetam (KEPPRA) 750 MG tablet Take 1,500 mg by mouth 2 (two) times daily.   Yes [provider]  losartan (COZAAR) 100 MG tablet Take 100 mg by mouth daily.   Yes [provider]  Melatonin 10 MG TABS Take 1 tablet by mouth at bedtime.   Yes [provider]  ondansetron (ZOFRAN) 4 MG tablet Take 1 tablet (4 mg total) by mouth every 8 (eight) hours as needed for nausea or vomiting. 10/08/21  Yes Cristie Hem, PA-C  pantoprazole (PROTONIX) 40 MG tablet Take 1 tablet (40 mg total) by mouth daily. Patient taking differently: Take 40 mg by mouth 2 (two) times daily. 05/22/21  Yes Sheikh, Omair Latif, DO  PARoxetine (PAXIL) 20 MG tablet Take 10 mg by mouth daily.   Yes [provider]  terazosin (HYTRIN) 1 MG capsule Take 1 mg by mouth at bedtime.   Yes [provider]  atorvastatin (LIPITOR) 80 MG tablet Take 1 tablet (80 mg total) by mouth every  evening. Patient not taking: Reported on 09/17/2023 07/31/21   Patwardhan, Anabel Bene, MD  glipiZIDE (GLUCOTROL) 5 MG tablet Take 1 tablet (5 mg total) by mouth daily. 05/21/21 05/21/22  Merlene Laughter, DO    Social History   Socioeconomic History   Marital status: Married    Spouse name: [name]   Number of children: Not on file   Years of education: Not on file   Highest education level: Not on file  Occupational History   Not on file  Tobacco Use   Smoking status: Never   Smokeless tobacco: Never  Vaping Use   Vaping status: Never  Used  Substance and Sexual Activity   Alcohol use: Not Currently    Comment: rare   Drug use: Never   Sexual activity: Not on file  Other Topics Concern   Not on file  Social History Narrative   Not on file   Social Determinants of Health   Financial Resource Strain: Low Risk  (08/10/2023)   Received from [facility]   Overall Financial Resource Strain (CARDIA)    Difficulty of Paying Living Expenses: Not hard at all  Food Insecurity: No Food Insecurity (09/17/2023)   Hunger Vital Sign    Worried About Running Out of Food in the Last Year: Never true    Ran Out of Food in the Last Year: Never true  Transportation Needs: No Transportation Needs (09/17/2023)   PRAPARE - Administrator, Civil Service (Medical): No    Lack of Transportation (Non-Medical): No  Physical Activity: Not on file  Stress: No Stress Concern Present (08/10/2023)   Received from [facility]   [employer] of Occupational Health - Occupational Stress Questionnaire    Feeling of Stress : Not at all  Social Connections: Unknown (08/13/2023)   Received from [facility]   Social Network    Social Network: Not on file  Intimate Partner Violence: Not At Risk (09/17/2023)   Humiliation, Afraid, Rape, and Kick questionnaire    Fear of Current or Ex-Partner: No    Emotionally Abused: No    Physically Abused: No    Sexually Abused: No     Family  History  Problem Relation Age of Onset   Stroke Mother    Cancer Mother    Heart disease Father     ROS: Otherwise negative unless mentioned in HPI  Physical Examination  Vitals:   09/29/23 0455 09/29/23 0919  BP: 123/70 121/81  Pulse: 73 70  Resp: 20 18  Temp: 98.3 F (36.8 C) 97.7 F (36.5 C)  SpO2: 96%    Body mass index is 23.83 kg/m.  General:  chronically ill appearing male in a wheelchair Gait: Not observed HENT: craniectomy helmet in place Pulmonary: normal non-labored breathing Cardiac: regular Abdomen: soft, nondistended. PEG tube in place Skin: without rashes. Wound vac to sacral decubitus ulcer Vascular Exam/Pulses: palpable radial pulses bilaterally Extremities: well healed left BKA Neurologic: alert, nonverbal. Unable to move entire left side  CBC    Component Value Date/Time   WBC 9.3 09/28/2023 0535   RBC 2.37 (L) 09/28/2023 0535   HGB 7.6 (L) 09/28/2023 0535   HCT 22.5 (L) 09/28/2023 0535   PLT 253 09/28/2023 0535   MCV 94.9 09/28/2023 0535   MCH 32.1 09/28/2023 0535   MCHC 33.8 09/28/2023 0535   RDW 15.1 09/28/2023 0535   LYMPHSABS 1.7 09/18/2023 0058   MONOABS 2.1 (H) 09/18/2023 0058   EOSABS 0.0 09/18/2023 0058   BASOSABS 0.1 09/18/2023 0058    BMET    Component Value Date/Time   NA 131 (L) 09/28/2023 0535   K 4.5 09/28/2023 0535   CL 102 09/28/2023 0535   CO2 23 09/28/2023 0535   GLUCOSE 102 (H) 09/28/2023 0535   BUN 31 (H) 09/28/2023 0535   CREATININE 2.80 (H) 09/28/2023 0535   CREATININE 2.73 (H) 10/13/2021 1139   CALCIUM 8.2 (L) 09/28/2023 0535   CALCIUM 8.7 01/19/2022 1444   GFRNONAA 26 (L) 09/28/2023 0535    COAGS: Lab Results  Component Value Date   INR 1.1 09/18/2023   INR 1.5 (H) 07/01/2021  INR 1.0 06/04/2021     ASSESSMENT/PLAN: This is a 52 y.o. male with ESRD   -The patient has a history of CKD with recent progression to ESRD.  He was recently hospitalized in [place] for an extended period of time  after ICH requiring craniectomy.  Hospitalization was complicated by renal failure, C. difficile, sacral decubitus ulcer, PEG and tracheostomy placement.  He was weaned off his tracheostomy prior to discharge -He was admitted to [facility] with nausea, vomiting, and fevers.  He was found to have a UTI due to Pseudomonas.  His PEG tube was also clogged. -He has finished his antibiotic course.  He has been afebrile with a normal WBC count since 11/16 -We were consulted for possible permanent dialysis access placement.  All of the history is obtained from the patient's wife, as the patient is not interactive.  She is seeking permanent access placement in hopes for home therapies.  The patient is most likely not a home dialysis therapy candidate -I discussed with the patient's wife should we proceed with access placement, this would be a fistula versus graft in the left arm.  I have ordered vein mapping.  The patient is apparently stable for discharge today and he does not need to stay overnight for access placement -Ideally the patient should stabilize more, with hopeful sacral decubitus ulcer healing prior to access.  Pending further discussion with Dr. Cottie Banda PA-C [facility] [phone]   VASCULAR STAFF ADDENDUM: I have independently interviewed and examined the patient. I agree with the above.  Discussed in detail with the patient's wife. I do not think dialysis access surgery is reasonable at this time because of recent intracranial hemorrhage and his tenuous recovery. Will re-evaluate in 1-2 months in the office.  Rande Brunt. Lenell Antu, MD [facility] Phone Number: [phone] 09/29/2023 6:08 PM

## 2023-09-29 NOTE — Progress Notes (Signed)
Case discussed with PT earlier today. Contacted [facility] to inquire if clinic staff can move pt's w/c cushion to HD chair prior to starting HD treatment at d/c (pt will need to be transferred by lift). Clinic states that clinic staff can move w/c cushion to HD chair for pt to use while receiving HD. Will assist as needed.   Olivia Canter Renal Navigator [phone]

## 2023-09-29 NOTE — Progress Notes (Signed)
PROGRESS NOTE  Bruce Little JYN:[id number] DOB: 12-16-1970 DOA: 09/17/2023 PCP: Westley Hummer, MD   LOS: 12 days   Brief Narrative / Interim history: This is a 52 year old male with DM, PAF on Eliquis in the past, CKD now on dialysis, left BKA who comes into the hospital with intermittent nausea and vomiting, fever, and clogged PEG tube.  He has had a prolonged hospitalization at [facility] in [place] in August, admitted there 04/30/2023 and discharged 07/01/2023.  Hospital course reviewed.  He was visiting [place] from [place], was in a hotel when he was found to have altered mental status, vomiting.  He was found to be hypertensive in the ER with a blood pressure of 226/100, and a CT of the head showed 9.2 x 5.5 cm right frontal temporal parenchymal bleed with edema, mass effect and 1.1 cm left midline shift.  He is status post craniectomy and hematoma evacuation and EVD placement.  Hospital course complicated by Staph epidermidis in the CSF 7/17, will repeat growth 7/22 and 7/24.  Eventually EVD was removed, and there were no plans to replace the bone flap and will need artificial plate eventually.  He will developed sacral decubitus ulcer and underwent serial debridements, sacral wounds grew E. coli, Morganella and Enterococcus faecalis and placed of antibiotics for several weeks.  Hospital course was also complicated by C. difficile diarrhea status post full course of vancomycin while being on IV antibiotics also, and in addition, had a PEG and a trach and developed renal failure requiring dialysis.  Following [place] hospitalization, [facility] and [facility] was [facility] August 22 through August 10, 2023.  Worth mentioning is that he was decannulated on 07/23/2023, and weaned off to room air, and also with ongoing SLP he was started on p.o. intake along with his PEG tube.  He has been in rehab through October, but apparently has been home for couple of  weeks prior to being here.  Subjective / 24h Interval events: No complaints this morning.  Wife is at bedside.  Assesement and Plan: Principal problem SIRS with fever, leukocytosis -patient was febrile to 100.7 on admission, and had leukocytosis.  He was placed on antibiotics, and urine cultures eventually grew Pseudomonas.  ID consulted, he was treated and has completed antibiotics on 11/16.  Currently stable, afebrile  Active problems History of PAF-currently in sinus rhythm.  He is on amiodarone, Coreg, continue.  No longer on anticoagulation following his ICH in August  Nausea, vomiting, diarrhea induced by tube feeds-tells me he has been having some nausea but no further vomiting.  Will discus with the wife about reinitiation of tube feeds and maybe use Imodium -Discussed with wife at bedside, she wishes for patient to have p.o. intake and not have tube feeds due to persistent diarrhea, encourage nutritional supplementations  History of ICH-closely monitor mental status.  CT scan done on admission does not show any evidence of recurrent bleed.  He has established with local neurosurgery to have a plate fitted -Patient had an episode of right arm involuntary movement, concern for seizure.  Repeat CT scan unremarkable.  EEG unremarkable.  Of note, I saw him within a minute following episode and he had no postictal confusion  ESRD-nephrology following, getting dialysis while here.  Nephrology consulting vascular surgery today to see if he can get permanent HD access, hopefully tomorrow  Chronic urinary retention-with chronic Foley  Stage IV sacral decubitus ulcer, POA-CT scan this admission showed resorption of  the inferior sacrum and coccyx without abscess.  ID consulted as well.  Underwent debridement by general surgery in August, and no further debridements were recommended following his [facility] stay -Surgery consulted, underwent bedside debridement 11/11.  -Currently stable with a wound  VAC, no longer has diarrhea once tube feeds were discontinued.  Case management looking into arranging wound VAC for home  Hyponatremia-in the setting of renal disease  Hypokalemia-continue to monitor and replenish as indicated  Vitamin D deficiency-start supplementation  Anemia-of chronic renal disease as well as chronic illness.  Transfuse unit of packed red blood cells for hemoglobin of 6.7.  Hemoglobin improved appropriately, today CBC pending  Status post trach-now decannulated, on room air  History of seizures-continue Keppra  Essential hypertension-continue antihypertensives as below, blood pressure stable  Type 2 diabetes mellitus-overall stable CBGs  Lab Results  Component Value Date   HGBA1C 5.1 09/18/2023   CBG (last 3)  Recent Labs    09/28/23 2047 09/29/23 0221 09/29/23 0704  GLUCAP 156* 116* 110*    Scheduled Meds:  amiodarone  100 mg Oral Daily   amLODipine  10 mg Oral Daily   ascorbic acid  500 mg Oral BID   atorvastatin  40 mg Oral QHS   carvedilol  25 mg Oral BID WC   Chlorhexidine Gluconate Cloth  6 each Topical Q0600   cloNIDine  0.1 mg Oral TID   [START ON 10/02/2023] darbepoetin (ARANESP) injection - DIALYSIS  100 mcg Subcutaneous Q Sat-1800   feeding supplement  237 mL Oral BID BM   finasteride  5 mg Oral Daily   hydrALAZINE  25 mg Oral Q8H   influenza vac split trivalent PF  0.5 mL Intramuscular Tomorrow-1000   insulin aspart  0-5 Units Subcutaneous QHS   insulin aspart  0-6 Units Subcutaneous TID WC   insulin glargine-yfgn  5 Units Subcutaneous Daily   leptospermum manuka honey  1 Application Topical Daily   levETIRAcetam  1,500 mg Oral BID   losartan  50 mg Oral Daily   multivitamin  1 tablet Oral QHS   nutrition supplement (JUVEN)  1 packet Oral BID BM   pantoprazole  40 mg Oral BID   PARoxetine  10 mg Oral Daily   sodium chloride flush  3 mL Intravenous Q12H   terazosin  1 mg Oral QHS   vitamin A  50,000 Units Oral Daily   Vitamin D  (Ergocalciferol)  50,000 Units Oral Q7 days   Continuous Infusions:    PRN Meds:.acetaminophen **OR** acetaminophen, albuterol, food thickener, melatonin, methocarbamol, ondansetron (ZOFRAN) IV, polyethylene glycol, prochlorperazine, sorbitol  Current Outpatient Medications  Medication Instructions   albuterol (VENTOLIN HFA) 108 (90 Base) MCG/ACT inhaler 2 puffs, Inhalation, Every 4 hours PRN   amiodarone (PACERONE) 100 mg, Oral, Daily   amLODipine (NORVASC) 10 mg, Oral, Daily   atorvastatin (LIPITOR) 80 mg, Oral, Every evening   atorvastatin (LIPITOR) 40 mg, Oral, Daily at bedtime   carvedilol (COREG) 37.5 mg, Oral, 2 times daily with meals   cloNIDine (CATAPRES) 0.3 mg, Oral, 3 times daily   finasteride (PROSCAR) 5 mg, Oral, Daily   furosemide (LASIX) 40 mg, Oral, Daily   glipiZIDE (GLUCOTROL) 5 mg, Oral, Daily   hydrALAZINE (APRESOLINE) 25 mg, Oral, Every 8 hours   Lantus SoloStar 5 Units, Subcutaneous, Every 12 hours   levETIRAcetam (KEPPRA) 1,500 mg, Oral, 2 times daily   losartan (COZAAR) 100 mg, Oral, Daily   Melatonin 10 MG TABS 1 tablet, Oral, Daily at bedtime  ondansetron (ZOFRAN) 4 mg, Oral, Every 8 hours PRN   pantoprazole (PROTONIX) 40 mg, Oral, Daily   PARoxetine (PAXIL) 10 mg, Oral, Daily   terazosin (HYTRIN) 1 mg, Oral, Daily at bedtime    Diet Orders (From admission, onward)     Start     Ordered   09/22/23 1531  DIET DYS 3 Room service appropriate? Yes with Assist; Fluid consistency: Nectar Thick  Diet effective now       Comments: May have sips of thin liquids in between meals  Question Answer Comment  Room service appropriate? Yes with Assist   Fluid consistency: Nectar Thick      09/22/23 1531            DVT prophylaxis: SCDs Start: 09/17/23 2336   Lab Results  Component Value Date   PLT 253 09/28/2023      Code Status: Full Code  Family Communication: Discussed with wife [name]  Status is: Inpatient Remains inpatient appropriate  because: severity of illness  Level of care: Telemetry Medical  Consultants:  Nephrology ID  Objective: Vitals:   09/28/23 2045 09/29/23 0455 09/29/23 0603 09/29/23 0919  BP: 103/60 123/70  121/81  Pulse: 73 73  70  Resp: 18 20  18   Temp: 98 F (36.7 C) 98.3 F (36.8 C)  97.7 F (36.5 C)  TempSrc: Oral Oral  Oral  SpO2: 96% 96%    Weight:   84.2 kg   Height:        Intake/Output Summary (Last 24 hours) at 09/29/2023 1058 Last data filed at 09/29/2023 1000 Gross per 24 hour  Intake 360 ml  Output 4400 ml  Net -4040 ml   Wt Readings from Last 3 Encounters:  09/29/23 84.2 kg  04/14/23 111.1 kg  10/29/21 86.1 kg    Examination:  Constitutional: NAD Eyes: lids and conjunctivae normal, no scleral icterus ENMT: mmm Neck: normal, supple Respiratory: clear to auscultation bilaterally, no wheezing, no crackles. Normal respiratory effort.  Cardiovascular: Regular rate and rhythm, no murmurs / rubs / gallops. No LE edema. Abdomen: soft, no distention, no tenderness. Bowel sounds positive.   Data Reviewed: I have independently reviewed following labs and imaging studies   CBC Recent Labs  Lab 09/22/23 1631 09/23/23 0855 09/25/23 0640 09/27/23 0440 09/28/23 0535  WBC 10.8* 12.4* 8.2 10.5 9.3  HGB 7.4* 7.3* 7.5* 7.8* 7.6*  HCT 22.3* 22.1* 22.9* 23.9* 22.5*  PLT 196 189 199 227 253  MCV 96.1 97.4 95.4 96.4 94.9  MCH 31.9 32.2 31.3 31.5 32.1  MCHC 33.2 33.0 32.8 32.6 33.8  RDW 15.2 14.8 14.9 15.1 15.1    Recent Labs  Lab 09/22/23 1631 09/23/23 0600 09/23/23 0855 09/24/23 0636 09/25/23 0640 09/27/23 0440 09/28/23 0535  NA 131*  --  130* 132* 132* 130* 131*  K 3.9  --  4.3 4.2 4.6 4.4 4.5  CL 97*  --  98 98 100 100 102  CO2 26  --  26 25 25 24 23   GLUCOSE 339*  --  272* 134* 126* 112* 102*  BUN 15  --  19 11 15  28* 31*  CREATININE 1.84*  --  2.38* 1.95* 2.45* 2.42* 2.80*  CALCIUM 7.0*  --  7.1* 7.7* 8.1* 8.0* 8.2*  AST 31  --   --   --   --   --   --    ALT 30  --   --   --   --   --   --  ALKPHOS 116  --   --   --   --   --   --   BILITOT 0.3  --   --   --   --   --   --   ALBUMIN <1.5*  --  <1.5* <1.5* <1.5*  --  1.5*  MG 1.4* 1.4*  --   --   --   --  1.7    ------------------------------------------------------------------------------------------------------------------ No results for input(s): "CHOL", "HDL", "LDLCALC", "TRIG", "CHOLHDL", "LDLDIRECT" in the last 72 hours.  Lab Results  Component Value Date   HGBA1C 5.1 09/18/2023   ------------------------------------------------------------------------------------------------------------------ No results for input(s): "TSH", "T4TOTAL", "T3FREE", "THYROIDAB" in the last 72 hours.  Invalid input(s): "FREET3"  Cardiac Enzymes No results for input(s): "CKMB", "TROPONINI", "MYOGLOBIN" in the last 168 hours.  Invalid input(s): "CK" ------------------------------------------------------------------------------------------------------------------ No results found for: "BNP"  CBG: Recent Labs  Lab 09/28/23 1241 09/28/23 1710 09/28/23 2047 09/29/23 0221 09/29/23 0704  GLUCAP 100* 111* 156* 116* 110*    No results found for this or any previous visit (from the past 240 hour(s)).    Radiology Studies: No results found.   Pamella Pert, MD, PhD [facility]  Between 7 am - 7 pm I am available, please contact me via Amion (for emergencies) or Securechat (non urgent messages)  Between 7 pm - 7 am I am not available, please contact night coverage MD/APP via Amion

## 2023-09-29 NOTE — Consult Note (Signed)
WOC team made a visit to check the VAC. The seal is good and the machine is running without problem. Medium amount: aproxx. 200 ml Change on Friday.  Silvestre Moment Acquanetta Sit WOC nurse.

## 2023-09-29 NOTE — Progress Notes (Signed)
Upper extremity venous mapping duplex completed. Please see CV Procedures for preliminary results.  Shona Simpson, RVT 09/29/23 3:37 PM

## 2023-09-30 DIAGNOSIS — I619 Nontraumatic intracerebral hemorrhage, unspecified: Secondary | ICD-10-CM | POA: Diagnosis not present

## 2023-09-30 DIAGNOSIS — L89154 Pressure ulcer of sacral region, stage 4: Secondary | ICD-10-CM | POA: Diagnosis not present

## 2023-09-30 DIAGNOSIS — N3000 Acute cystitis without hematuria: Secondary | ICD-10-CM | POA: Diagnosis not present

## 2023-09-30 DIAGNOSIS — D72829 Elevated white blood cell count, unspecified: Secondary | ICD-10-CM | POA: Diagnosis not present

## 2023-09-30 LAB — BASIC METABOLIC PANEL
Anion gap: 6 (ref 5–15)
BUN: 35 mg/dL — ABNORMAL HIGH (ref 6–20)
CO2: 24 mmol/L (ref 22–32)
Calcium: 8.1 mg/dL — ABNORMAL LOW (ref 8.9–10.3)
Chloride: 101 mmol/L (ref 98–111)
Creatinine, Ser: 2.81 mg/dL — ABNORMAL HIGH (ref 0.61–1.24)
GFR, Estimated: 26 mL/min — ABNORMAL LOW (ref >60)
Glucose, Bld: 133 mg/dL — ABNORMAL HIGH (ref 70–99)
Potassium: 4.4 mmol/L (ref 3.5–5.1)
Sodium: 131 mmol/L — ABNORMAL LOW (ref 135–145)

## 2023-09-30 LAB — CBC
HCT: 23.6 % — ABNORMAL LOW (ref 39.0–52.0)
Hemoglobin: 8 g/dL — ABNORMAL LOW (ref 13.0–17.0)
MCH: 32.5 pg (ref 26.0–34.0)
MCHC: 33.9 g/dL (ref 30.0–36.0)
MCV: 95.9 fL (ref 80.0–100.0)
Platelets: 297 10*3/uL (ref 150–400)
RBC: 2.46 MIL/uL — ABNORMAL LOW (ref 4.22–5.81)
RDW: 15.6 % — ABNORMAL HIGH (ref 11.5–15.5)
WBC: 14.1 10*3/uL — ABNORMAL HIGH (ref 4.0–10.5)
nRBC: 0 % (ref 0.0–0.2)

## 2023-09-30 LAB — GLUCOSE, CAPILLARY
Glucose-Capillary: 130 mg/dL — ABNORMAL HIGH (ref 70–99)
Glucose-Capillary: 125 mg/dL — ABNORMAL HIGH (ref 70–99)
Glucose-Capillary: 173 mg/dL — ABNORMAL HIGH (ref 70–99)
Glucose-Capillary: 201 mg/dL — ABNORMAL HIGH (ref 70–99)

## 2023-09-30 MED ORDER — HEPARIN SODIUM (PORCINE) 1000 UNIT/ML DIALYSIS: 1000.0000 [IU] | INTRAMUSCULAR | Status: DC | PRN

## 2023-09-30 MED ORDER — ANTICOAGULANT SODIUM CITRATE 4% (200MG/5ML) IV SOLN: 5.0000 mL | Status: DC | PRN

## 2023-09-30 MED ORDER — ALTEPLASE 2 MG IJ SOLR: 2.0000 mg | Freq: Once | INTRAMUSCULAR | Status: DC | PRN

## 2023-09-30 MED ORDER — HEPARIN SODIUM (PORCINE) 1000 UNIT/ML IJ SOLN
3200.0000 [IU] | Freq: Once | INTRAMUSCULAR | Status: AC
  Administered 2023-09-30: 3200 [IU]
  Filled 2023-09-30: qty 3.2

## 2023-09-30 NOTE — Progress Notes (Signed)
[facility] Progress Note   Subjective: Wife called prior to HD to speak to RN to be sure that patient is positioned on L side and is unable to occlude [facility] during HD.  Patient seen, sleeping, NAD. Positioned as noted above.     Objective Vitals:   09/29/23 2002 09/30/23 0500 09/30/23 0816 09/30/23 0835  BP: 109/65 131/74 127/75 131/82  Pulse: 73 72 74 75  Resp: 18 18 19 12   Temp: 98.8 F (37.1 C) 98.1 F (36.7 C) 97.8 F (36.6 C) 98.1 F (36.7 C)  TempSrc: Oral  Oral   SpO2: 97% 94% 98% 100%  Weight:      Height:       Physical Exam General: Chronically ill appearing male in NAD Heart: RRR Lungs: CTAB No WOB Abdomen: Peg tube. 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 Sacral wound not visible with wound vac Dialysis Access: RIJ [facility] drsg intact  Additional Objective Labs: Basic Metabolic Panel: Recent Labs  Lab 09/25/23 0421 09/25/23 0640 09/27/23 0440 09/28/23 0535 09/30/23 0553  NA  --  132* 130* 131* 131*  K  --  4.6 4.4 4.5 4.4  CL  --  100 100 102 101  CO2  --  25 24 23 24   GLUCOSE  --  126* 112* 102* 133*  BUN  --  15 28* 31* 35*  CREATININE  --  2.45* 2.42* 2.80* 2.81*  CALCIUM  --  8.1* 8.0* 8.2* 8.1*  PHOS 3.2 3.2  --  3.7  --    Liver Function Tests: Recent Labs  Lab 09/24/23 0636 09/25/23 0640 09/28/23 0535  ALBUMIN <1.5* <1.5* 1.5*   No results for input(s): "LIPASE", "AMYLASE" in the last 168 hours. CBC: Recent Labs  Lab 09/25/23 0640 09/27/23 0440 09/28/23 0535 09/30/23 0553  WBC 8.2 10.5 9.3 14.1*  HGB 7.5* 7.8* 7.6* 8.0*  HCT 22.9* 23.9* 22.5* 23.6*  MCV 95.4 96.4 94.9 95.9  PLT 199 227 253 297   Blood Culture    Component Value Date/Time   SDES BLOOD RIGHT ARM 09/17/2023 2024   SPECREQUEST  09/17/2023 2024    BOTTLES DRAWN AEROBIC AND ANAEROBIC Blood Culture adequate volume   CULT  09/17/2023 2024    NO GROWTH 5 DAYS Performed at [facility], [address] [place], [place] [address]     REPTSTATUS 09/22/2023 FINAL 09/17/2023 2024    Cardiac Enzymes: No results for input(s): "CKTOTAL", "CKMB", "CKMBINDEX", "TROPONINI" in the last 168 hours. CBG: Recent Labs  Lab 09/29/23 1145 09/29/23 1648 09/29/23 2005 09/30/23 0502 09/30/23 0818  GLUCAP 130* 176* 198* 130* 125*   Iron Studies: No results for input(s): "IRON", "TIBC", "TRANSFERRIN", "FERRITIN" in the last 72 hours. @lablastinr3 @ Studies/Results: VAS [place] UPPER EXT VEIN MAPPING (PRE-OP AVF)  Result Date: 09/29/2023 UPPER EXTREMITY VEIN MAPPING Patient Name:  Bruce Little  Date of Exam:   09/29/2023 Medical Rec #: [id number]     Accession #:    [id number] Date of Birth: 20-Sep-1971     Patient Gender: M Patient Age:   52 years Exam Location:  [facility] Procedure:      VAS [place] UPPER EXT VEIN MAPPING (PRE-OP AVF) Referring Phys: [facility] --------------------------------------------------------------------------------  Indications: History of PAD; patient is pre-operative for bypass. Comparison Study: No prior study Performing Technologist: Shona Simpson  Examination Guidelines: A complete evaluation includes B-mode imaging, spectral Doppler, color Doppler, and power Doppler as needed of all accessible portions of each vessel. Bilateral  testing is considered an integral part of a complete examination. Limited examinations for reoccurring indications may be performed as noted. +-----------------+-------------+----------+---------+ Right Cephalic   Diameter (cm)Depth (cm)Findings  +-----------------+-------------+----------+---------+ Shoulder             0.46        1.22             +-----------------+-------------+----------+---------+ Prox upper arm       0.36        0.59             +-----------------+-------------+----------+---------+ Mid upper arm        0.37        0.36             +-----------------+-------------+----------+---------+ Dist upper arm       0.40        0.42   branching  +-----------------+-------------+----------+---------+ Antecubital fossa    0.63        0.15   branching +-----------------+-------------+----------+---------+ Prox forearm         0.45        0.46             +-----------------+-------------+----------+---------+ Mid forearm          0.41        0.41             +-----------------+-------------+----------+---------+ Dist forearm         0.34        0.22             +-----------------+-------------+----------+---------+ Wrist                0.37        0.22   branching +-----------------+-------------+----------+---------+ +-----------------+-------------+----------+--------------+ Right Basilic    Diameter (cm)Depth (cm)   Findings    +-----------------+-------------+----------+--------------+ Shoulder                                not visualized +-----------------+-------------+----------+--------------+ Prox upper arm                          not visualized +-----------------+-------------+----------+--------------+ Mid upper arm                           not visualized +-----------------+-------------+----------+--------------+ Dist upper arm       0.80        1.33     branching    +-----------------+-------------+----------+--------------+ Antecubital fossa    0.78        1.50     branching    +-----------------+-------------+----------+--------------+ Prox forearm         0.42        0.18     branching    +-----------------+-------------+----------+--------------+ Mid forearm          0.41        0.18                  +-----------------+-------------+----------+--------------+ Distal forearm       0.34        0.22                  +-----------------+-------------+----------+--------------+ Wrist                0.30        0.21                  +-----------------+-------------+----------+--------------+ +-----------------+-------------+----------+---------+  Left Cephalic    Diameter  (cm)Depth (cm)Findings  +-----------------+-------------+----------+---------+ Shoulder             0.30        1.68             +-----------------+-------------+----------+---------+ Prox upper arm       0.30        0.90             +-----------------+-------------+----------+---------+ Mid upper arm        0.30        0.43             +-----------------+-------------+----------+---------+ Dist upper arm       0.29        0.52   branching +-----------------+-------------+----------+---------+ Antecubital fossa    0.47        0.53   branching +-----------------+-------------+----------+---------+ Prox forearm         0.19        0.71   branching +-----------------+-------------+----------+---------+ Mid forearm          0.24        0.41             +-----------------+-------------+----------+---------+ Dist forearm         0.15        0.21   branching +-----------------+-------------+----------+---------+ Wrist                0.15        0.23             +-----------------+-------------+----------+---------+ +-----------------+-------------+----------+--------------+ Left Basilic     Diameter (cm)Depth (cm)   Findings    +-----------------+-------------+----------+--------------+ Shoulder                                not visualized +-----------------+-------------+----------+--------------+ Prox upper arm       0.51        1.47     branching    +-----------------+-------------+----------+--------------+ Mid upper arm        0.39        1.14                  +-----------------+-------------+----------+--------------+ Dist upper arm       0.46        0.79                  +-----------------+-------------+----------+--------------+ Antecubital fossa    0.70        0.39     branching    +-----------------+-------------+----------+--------------+ Prox forearm         0.33        0.32                   +-----------------+-------------+----------+--------------+ Mid forearm          0.30        0.35                  +-----------------+-------------+----------+--------------+ Distal forearm       0.26        0.25                  +-----------------+-------------+----------+--------------+ Wrist                0.30        0.23     branching    +-----------------+-------------+----------+--------------+ *See table(s) above for measurements and observations.  Diagnosing physician: Heath Lark Electronically signed  by Heath Lark on 09/29/2023 at 6:12:04 PM.    Final    Medications:  anticoagulant sodium citrate      amiodarone  100 mg Oral Daily   amLODipine  10 mg Oral Daily   ascorbic acid  500 mg Oral BID   atorvastatin  40 mg Oral QHS   carvedilol  25 mg Oral BID WC   Chlorhexidine Gluconate Cloth  6 each Topical Q0600   cloNIDine  0.1 mg Oral TID   [START ON 10/02/2023] darbepoetin (ARANESP) injection - DIALYSIS  100 mcg Subcutaneous Q Sat-1800   feeding supplement  237 mL Oral BID BM   finasteride  5 mg Oral Daily   hydrALAZINE  25 mg Oral Q8H   influenza vac split trivalent PF  0.5 mL Intramuscular Tomorrow-1000   insulin aspart  0-5 Units Subcutaneous QHS   insulin aspart  0-6 Units Subcutaneous TID WC   insulin glargine-yfgn  5 Units Subcutaneous Daily   leptospermum manuka honey  1 Application Topical Daily   levETIRAcetam  1,500 mg Oral BID   losartan  50 mg Oral Daily   multivitamin  1 tablet Oral QHS   nutrition supplement (JUVEN)  1 packet Oral BID BM   pantoprazole  40 mg Oral BID   PARoxetine  10 mg Oral Daily   sodium chloride flush  3 mL Intravenous Q12H   terazosin  1 mg Oral QHS   vitamin A  50,000 Units Oral Daily   Vitamin D (Ergocalciferol)  50,000 Units Oral Q7 days     TTS - [place]  4hrs 500/AF 1.5,  EDW 98.6kg, 3.0 K/ 2.5Ca TDC  -No Heparin  - Mircera 50 mcg q 2 weeks - Not started yet - Hectorol IV TIW    Assessment/Plan: Vomiting  - Daily for months, now resolved per patient, G tube malfunction.  Noted IR 11/11 consult "both lumens flush now easily."  Management per admit team tolerating bolus feedings currently.  ALB less than 1.5 dietary consulted getting supplements Hyponatremia-130> 132 timproved after UF on HD. Suspect this is a chronic issue. Follow labs.   ESRD -  On HD TTS. Started during prolonged hospitalization but previously followed for CKD stage 5 planning for HD.  Suspect muscle wasting causing falsely low SCr. Truncated HD 09/28/2023.  Next HD 10/02/2023 Leukocytosis - WBC RESOLVING WORKUP PER ADMIT AND ID consulted.  UTI suspected, ABX started.  Blood cultures obtained.  Has sacral decub status post 11/11 surgical bedside debridement, wound care following, "wife would like attempt for wound VAC replacement prior to discharge if possible " reattempt placement 09/27/23 surgery team per admit team Peg tube malfunction - IR  consulted.  As above.  And appears functional now further plans per PMD  Hypertension/volume  - BP in goal to slightly lowish past 24 hours, on multiple meds, can be titrating with HD UF.  Does not appear grossly overloaded.  Pleural effusions noted on CT. Variable weights here. Continue UF with HD as tolerated.   Anemia of CKD -Hgb 8.0 s/p 1 unit PRBC. Aranesp 60 mcg weekly.Increase dose 09/28/2023 to 100 mcg SQ. Follow HGB.   Secondary Hyperparathyroidism -  CCa  10.2 , Phos in the 1's improved today 3.2 with pharmacy help supplementing phos dosing.   Nutrition - Regular diet, follow labs. Alb<1.5.  Low phosphorus, K okay add protein supplements.  Dietary consulted by admit team Hx hemorrhagic stroke -left-sided hemiparesis -Rx Per admit team/noted previous rehab center admit and was at  home prior to this admission Hypokalemia -resolved. Follow labs.  [facility] consult for permanent access. Patient's wife is interested in home HD. Dr. Valentino Nose is aware. Unfortunately Dr. Lenell Antu does not find him stable  enough at present to proceed with permanent access at this time. Will return to [facility] as OP.     Demetric Parslow H. Tanveer Brammer NP-C 09/30/2023, 9:27 AM  [employer] [phone]

## 2023-09-30 NOTE — Consult Note (Signed)
WOC Nurse Consult Note:  Pt had stooling last night and Vac could not maintain a seal and was removed.  Requested to re-apply dressing since patient has been in dialysis most of the day. Cleaned loose stool again before applying the Vac dressing.  If he continues to have loose incontinent stools, Vac will not be able to maintain a seal, related to the close approximation to the rectum.  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   Removed old NPWT dressing Cleansed wound with normal saline Applied barrier ring to lower wound edges to attempt to maintain a seal.  I did not bridge the track pad away from the affected area to attempt to maintain a better seal.  Pt denies need for pain meds and tolerated without apparent discomfort. Applied 1 piece black foam to cont suction.  WOC team will plan to change the dressing again on Monday if patient is still in the hospital at that time.  He plans to discharge home with the Vac and home health assistance for dressing changes.  Thank-you,  Cammie Mcgee MSN, RN, CWOCN, [place], CNS [phone]

## 2023-09-30 NOTE — Progress Notes (Signed)
Speech Language Pathology Treatment: Dysphagia  Patient Details Name: Bruce Little MRN: [id number] DOB: Sep 09, 1971 Today's Date: 09/30/2023 Time: [id number] SLP Time Calculation (min) (ACUTE ONLY): 10 min  Assessment / Plan / Recommendation Clinical Impression  Patient seen by SLP for skilled treatment focused on discussion and education with patient and spouse regarding dysphagia goals. Spouse reported that she feels the nectar thick liquids continue to be best for patient as he does not have any coughing when drinking thickened liquids but continues with coughing with thin liquids. Coughing leads to regurgitation as well as discomfort and pain. Spouse does not feel that a repeat modified barium swallow study would show much change as compared to one in September and SLP in agreement. SLP provided spouse with starter kit of simply thick nectar thick and directed her to the IDDSI (International Dysphagia Diet Standardisation Initiative) website for more information about solid and liquid consistency management. SLP will s/o at this time as goals met and education complete. Recommend HH SLP services upon discharge.   HPI HPI: Pt is a 52 year old male who presents with intermittent nausea and vomiting, fever, and clogged PEG tube. 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 Following [place] hospitalization, he was discharged to [facility] LT[facility] and was there August 22 through August 10, 2023. Worth mentioning is that he was decannulated on 07/23/2023, and weaned off to room air. With ongoing SLP intervention he was started on p.o. intake along with his tube feeds. OP MBS at [facility] (08/16/23) revealed swallow function WNL with recommendations for regular diet/thin liquids with supervision during PO intake given impulsivity. PMH: DM, PAF on Eliquis in the past, CKD now on dialysis, left BKA; ST recommended D3/NTL during tx sessions  d/t coughing with liquids.  ST f/u for diet tolerance/education/potential progression of diet.      SLP Plan  Discharge SLP treatment due to (comment);All goals met      Recommendations for follow up therapy are one component of a multi-disciplinary discharge planning process, led by the attending physician.  Recommendations may be updated based on patient status, additional functional criteria and insurance authorization.    Recommendations  Diet recommendations: Nectar-thick liquid;Dysphagia 3 (mechanical soft) Liquids provided via: Cup;Straw Medication Administration: Whole meds with liquid Supervision: Staff to assist with self feeding Compensations: Minimize environmental distractions;Slow rate;Small sips/bites Postural Changes and/or Swallow Maneuvers: Seated upright 90 degrees                  Oral care BID   Set up Supervision/Assistance Dysphagia, unspecified (R13.10)     Discharge SLP treatment due to (comment);All goals met     Angela Nevin, MA, CCC-SLP Speech Therapy

## 2023-09-30 NOTE — Progress Notes (Signed)
PT Cancellation Note  Patient Details Name: GEARL ALSOBROOKS MRN: [id number] DOB: Nov 27, 1970   Cancelled Treatment:    Reason Eval/Treat Not Completed: Patient at procedure or test/unavailable this morning, will continue to follow and progress treatment as time/schedule allows.   Vickki Muff, PT, DPT   Acute Rehabilitation Department Office [phone] Secure Chat Communication Preferred   Ronnie Derby 09/30/2023, 9:39 AM

## 2023-09-30 NOTE — Progress Notes (Signed)
PROGRESS NOTE    Bruce Little  ZOX:[id number] DOB: [birth year] DOA: 09/17/2023 PCP: Westley Hummer, MD   Brief Narrative:  This is a 52 year old male with DM, PAF on Eliquis in the past, CKD now on dialysis, left BKA who comes into the hospital with intermittent nausea and vomiting, fever, and clogged PEG tube.   He has had a prolonged hospitalization at [facility] in [place] in August, admitted there 04/30/2023 and discharged 07/01/2023.  Hospital course reviewed.  He was visiting [place] from [place], was in a hotel when he was found to have altered mental status, vomiting.  He was found to be hypertensive in the ER with a blood pressure of 226/100, and a CT of the head showed 9.2 x 5.5 cm right frontal temporal parenchymal bleed with edema, mass effect and 1.1 cm left midline shift.  He is status post craniectomy and hematoma evacuation and EVD placement.  Hospital course complicated by Staph epidermidis in the CSF 7/17, will repeat growth 7/22 and 7/24.  Eventually EVD was removed, and there were no plans to replace the bone flap and will need artificial plate eventually.  He will developed sacral decubitus ulcer and underwent serial debridements, sacral wounds grew E. coli, Morganella and Enterococcus faecalis and placed of antibiotics for several weeks.  Hospital course was also complicated by C. difficile diarrhea status post full course of vancomycin while being on IV antibiotics also, and in addition, had a PEG and a trach and developed renal failure requiring dialysis.   Following [place] hospitalization, [facility] and he was there August 22 through August 10, 2023.  Worth mentioning is that he was decannulated on 07/23/2023, and weaned off to room air, and also with ongoing SLP he was started on p.o. intake along with his PEG tube.  He has been in rehab through October, but apparently has been home for couple of weeks prior to being  here.  Assessment & Plan:   Principal Problem:   Vomiting Active Problems:   Renal insufficiency   HTN (hypertension)   UTI (urinary tract infection)   Leukocytosis   Sacral decubitus ulcer   ICH (intracerebral hemorrhage) (HCC)   Malnutrition of moderate degree  Sepsis secondary to presumed UTI, POA, resolved -patient was febrile to 100.7 on admission, and had leukocytosis.  He was placed on antibiotics, and urine cultures eventually grew Pseudomonas.  ID consulted, he was treated and has completed antibiotics on 11/16.  Currently stable, afebrile   History of PAF-currently in sinus rhythm.  He is on amiodarone, Coreg, continue.  No longer on anticoagulation following his ICH in August   Nausea, vomiting, diarrhea induced by tube feeds-tells me he has been having some nausea but no further vomiting.  Will discus with the wife about reinitiation of tube feeds and maybe use Imodium -Discussed with wife at bedside, she wishes for patient to have p.o. intake and not have tube feeds due to persistent diarrhea, encourage nutritional supplementations   History of ICH-closely monitor mental status.  CT scan done on admission does not show any evidence of recurrent bleed.  He has established with local neurosurgery to have a plate fitted -Patient had an episode of right arm involuntary movement, concern for seizure.  Repeat CT scan unremarkable.  EEG unremarkable.  Of note, I saw him within a minute following episode and he had no postictal confusion   ESRD-nephrology following, getting dialysis while here.  Nephrology consulting vascular surgery today  to see if he can get permanent HD access in the next 24-48h   Chronic urinary retention-with chronic Foley   Stage IV sacral decubitus ulcer, POA-CT scan this admission showed resorption of the inferior sacrum and coccyx without abscess.  ID consulted as well.  Underwent debridement by general surgery in August, and no further debridements were  recommended following his [facility] stay -Surgery consulted, underwent bedside debridement 11/11.  -Currently stable with a wound VAC, no longer has diarrhea after tube feeds were discontinued.  Case management looking into arranging wound VAC for home prior to discharge.   Hyponatremia-in the setting of renal disease   Hypokalemia-continue to monitor and replenish as indicated   Vitamin D deficiency-start supplementation   Anemia-of chronic renal disease as well as chronic illness.  Transfuse unit of packed red blood cells for hemoglobin of 6.7.  Hemoglobin improved appropriately, today CBC pending   Status post trach-now decannulated, on room air   History of seizures-continue Keppra   Essential hypertension-continue antihypertensives as below, blood pressure stable   Type 2 diabetes mellitus-overall stable CBGs    DVT prophylaxis: SCDs Start: 09/17/23 2336   Code Status:   Code Status: Full Code  Family Communication: Not present  Status is: Inpt  Dispo: The patient is from: Home              Anticipated d/c is to: Home              Anticipated d/c date is: 24-48h              Patient currently is medically stable for discharge  Consultants:  Nephrology, vascular surgery  Antimicrobials:  Completed   Subjective: No acute issues/events overnight  Objective: Vitals:   09/29/23 1531 09/29/23 2002 09/30/23 0500 09/30/23 0816  BP: 116/69 109/65 131/74 127/75  Pulse: 76 73 72 74  Resp: 18 18 18 19   Temp: 97.7 F (36.5 C) 98.8 F (37.1 C) 98.1 F (36.7 C) 97.8 F (36.6 C)  TempSrc: Oral Oral  Oral  SpO2: 100% 97% 94% 98%  Weight:      Height:        Intake/Output Summary (Last 24 hours) at 09/30/2023 0821 Last data filed at 09/30/2023 0645 Gross per 24 hour  Intake 600 ml  Output 1100 ml  Net -500 ml   Filed Weights   09/28/23 0918 09/28/23 1222 09/29/23 0603  Weight: 90.7 kg 89.4 kg 84.2 kg    Examination:  General exam: Appears calm and  comfortable  Respiratory system: Clear to auscultation. Respiratory effort normal. Cardiovascular system: S1 & S2 heard, RRR. No JVD, murmurs, rubs, gallops or clicks. No pedal edema. Gastrointestinal system: Abdomen is nondistended, soft and nontender. No organomegaly or masses felt. Normal bowel sounds heard. Central nervous system: Alert and oriented. No focal neurological deficits. Extremities: Symmetric 5 x 5 power. Skin: No rashes, lesions or ulcers Psychiatry: Judgement and insight appear normal. Mood & affect appropriate.     Data Reviewed: I have personally reviewed following labs and imaging studies  CBC: Recent Labs  Lab 09/23/23 0855 09/25/23 0640 09/27/23 0440 09/28/23 0535 09/30/23 0553  WBC 12.4* 8.2 10.5 9.3 14.1*  HGB 7.3* 7.5* 7.8* 7.6* 8.0*  HCT 22.1* 22.9* 23.9* 22.5* 23.6*  MCV 97.4 95.4 96.4 94.9 95.9  PLT 189 199 227 253 297   Basic Metabolic Panel: Recent Labs  Lab 09/23/23 0855 09/24/23 0636 09/25/23 0421 09/25/23 0640 09/27/23 0440 09/28/23 0535 09/30/23 0553  NA 130* 132*  --  132* 130* 131* 131*  K 4.3 4.2  --  4.6 4.4 4.5 4.4  CL 98 98  --  100 100 102 101  CO2 26 25  --  25 24 23 24   GLUCOSE 272* 134*  --  126* 112* 102* 133*  BUN 19 11  --  15 28* 31* 35*  CREATININE 2.38* 1.95*  --  2.45* 2.42* 2.80* 2.81*  CALCIUM 7.1* 7.7*  --  8.1* 8.0* 8.2* 8.1*  MG  --   --   --   --   --  1.7  --   PHOS 1.4* 2.1* 3.2 3.2  --  3.7  --    GFR: Estimated Creatinine Clearance: 36.2 mL/min (A) (by C-G formula based on SCr of 2.81 mg/dL (H)). Liver Function Tests: Recent Labs  Lab 09/23/23 0855 09/24/23 0636 09/25/23 0640 09/28/23 0535  ALBUMIN <1.5* <1.5* <1.5* 1.5*   No results for input(s): "LIPASE", "AMYLASE" in the last 168 hours. No results for input(s): "AMMONIA" in the last 168 hours. Coagulation Profile: No results for input(s): "INR", "PROTIME" in the last 168 hours. Cardiac Enzymes: No results for input(s): "CKTOTAL", "CKMB",  "CKMBINDEX", "TROPONINI" in the last 168 hours. BNP (last 3 results) No results for input(s): "PROBNP" in the last 8760 hours. HbA1C: No results for input(s): "HGBA1C" in the last 72 hours. CBG: Recent Labs  Lab 09/29/23 0704 09/29/23 1145 09/29/23 1648 09/29/23 2005 09/30/23 0502  GLUCAP 110* 130* 176* 198* 130*   Lipid Profile: No results for input(s): "CHOL", "HDL", "LDLCALC", "TRIG", "CHOLHDL", "LDLDIRECT" in the last 72 hours. Thyroid Function Tests: No results for input(s): "TSH", "T4TOTAL", "FREET4", "T3FREE", "THYROIDAB" in the last 72 hours. Anemia Panel: No results for input(s): "VITAMINB12", "FOLATE", "FERRITIN", "TIBC", "IRON", "RETICCTPCT" in the last 72 hours. Sepsis Labs: No results for input(s): "PROCALCITON", "LATICACIDVEN" in the last 168 hours.  No results found for this or any previous visit (from the past 240 hour(s)).       Radiology Studies: VAS [place] UPPER EXT VEIN MAPPING (PRE-OP AVF)  Result Date: 09/29/2023 UPPER EXTREMITY VEIN MAPPING Patient Name:  SYRE TRITTEN  Date of Exam:   09/29/2023 Medical Rec #: [id number]     Accession #:    [id number] Date of Birth: [birth year]     Patient Gender: M Patient Age:   64 years Exam Location:  [facility] Procedure:      VAS [place] UPPER EXT VEIN MAPPING (PRE-OP AVF) Referring Phys: [facility] --------------------------------------------------------------------------------  Indications: History of PAD; patient is pre-operative for bypass. Comparison Study: No prior study Performing Technologist: Shona Simpson  Examination Guidelines: A complete evaluation includes B-mode imaging, spectral Doppler, color Doppler, and power Doppler as needed of all accessible portions of each vessel. Bilateral testing is considered an integral part of a complete examination. Limited examinations for reoccurring indications may be performed as noted. +-----------------+-------------+----------+---------+ Right Cephalic   Diameter  (cm)Depth (cm)Findings  +-----------------+-------------+----------+---------+ Shoulder             0.46        1.22             +-----------------+-------------+----------+---------+ Prox upper arm       0.36        0.59             +-----------------+-------------+----------+---------+ Mid upper arm        0.37        0.36             +-----------------+-------------+----------+---------+  Dist upper arm       0.40        0.42   branching +-----------------+-------------+----------+---------+ Antecubital fossa    0.63        0.15   branching +-----------------+-------------+----------+---------+ Prox forearm         0.45        0.46             +-----------------+-------------+----------+---------+ Mid forearm          0.41        0.41             +-----------------+-------------+----------+---------+ Dist forearm         0.34        0.22             +-----------------+-------------+----------+---------+ Wrist                0.37        0.22   branching +-----------------+-------------+----------+---------+ +-----------------+-------------+----------+--------------+ Right Basilic    Diameter (cm)Depth (cm)   Findings    +-----------------+-------------+----------+--------------+ Shoulder                                not visualized +-----------------+-------------+----------+--------------+ Prox upper arm                          not visualized +-----------------+-------------+----------+--------------+ Mid upper arm                           not visualized +-----------------+-------------+----------+--------------+ Dist upper arm       0.80        1.33     branching    +-----------------+-------------+----------+--------------+ Antecubital fossa    0.78        1.50     branching    +-----------------+-------------+----------+--------------+ Prox forearm         0.42        0.18     branching     +-----------------+-------------+----------+--------------+ Mid forearm          0.41        0.18                  +-----------------+-------------+----------+--------------+ Distal forearm       0.34        0.22                  +-----------------+-------------+----------+--------------+ Wrist                0.30        0.21                  +-----------------+-------------+----------+--------------+ +-----------------+-------------+----------+---------+ Left Cephalic    Diameter (cm)Depth (cm)Findings  +-----------------+-------------+----------+---------+ Shoulder             0.30        1.68             +-----------------+-------------+----------+---------+ Prox upper arm       0.30        0.90             +-----------------+-------------+----------+---------+ Mid upper arm        0.30        0.43             +-----------------+-------------+----------+---------+ Dist upper arm       0.29  0.52   branching +-----------------+-------------+----------+---------+ Antecubital fossa    0.47        0.53   branching +-----------------+-------------+----------+---------+ Prox forearm         0.19        0.71   branching +-----------------+-------------+----------+---------+ Mid forearm          0.24        0.41             +-----------------+-------------+----------+---------+ Dist forearm         0.15        0.21   branching +-----------------+-------------+----------+---------+ Wrist                0.15        0.23             +-----------------+-------------+----------+---------+ +-----------------+-------------+----------+--------------+ Left Basilic     Diameter (cm)Depth (cm)   Findings    +-----------------+-------------+----------+--------------+ Shoulder                                not visualized +-----------------+-------------+----------+--------------+ Prox upper arm       0.51        1.47     branching     +-----------------+-------------+----------+--------------+ Mid upper arm        0.39        1.14                  +-----------------+-------------+----------+--------------+ Dist upper arm       0.46        0.79                  +-----------------+-------------+----------+--------------+ Antecubital fossa    0.70        0.39     branching    +-----------------+-------------+----------+--------------+ Prox forearm         0.33        0.32                  +-----------------+-------------+----------+--------------+ Mid forearm          0.30        0.35                  +-----------------+-------------+----------+--------------+ Distal forearm       0.26        0.25                  +-----------------+-------------+----------+--------------+ Wrist                0.30        0.23     branching    +-----------------+-------------+----------+--------------+ *See table(s) above for measurements and observations.  Diagnosing physician: Heath Lark Electronically signed by Heath Lark on 09/29/2023 at 6:12:04 PM.    Final         Scheduled Meds:  amiodarone  100 mg Oral Daily   amLODipine  10 mg Oral Daily   ascorbic acid  500 mg Oral BID   atorvastatin  40 mg Oral QHS   carvedilol  25 mg Oral BID WC   Chlorhexidine Gluconate Cloth  6 each Topical Q0600   cloNIDine  0.1 mg Oral TID   [START ON 10/02/2023] darbepoetin (ARANESP) injection - DIALYSIS  100 mcg Subcutaneous Q Sat-1800   feeding supplement  237 mL Oral BID BM   finasteride  5 mg Oral Daily   hydrALAZINE  25 mg Oral Q8H   influenza  vac split trivalent PF  0.5 mL Intramuscular Tomorrow-1000   insulin aspart  0-5 Units Subcutaneous QHS   insulin aspart  0-6 Units Subcutaneous TID WC   insulin glargine-yfgn  5 Units Subcutaneous Daily   leptospermum manuka honey  1 Application Topical Daily   levETIRAcetam  1,500 mg Oral BID   losartan  50 mg Oral Daily   multivitamin  1 tablet Oral QHS   nutrition  supplement (JUVEN)  1 packet Oral BID BM   pantoprazole  40 mg Oral BID   PARoxetine  10 mg Oral Daily   sodium chloride flush  3 mL Intravenous Q12H   terazosin  1 mg Oral QHS   vitamin A  50,000 Units Oral Daily   Vitamin D (Ergocalciferol)  50,000 Units Oral Q7 days   Continuous Infusions:  anticoagulant sodium citrate       LOS: 13 days   Time spent: 25 min  [name], DO [facility]  If 7PM-7AM, please contact night-coverage [email or web address]  09/30/2023, 8:21 AM

## 2023-09-30 NOTE — Progress Notes (Signed)
Received patient in bed.Awake,alert and oriented x 2.Consent verified.  Access used : Right HD catheter that worked well.Dressing on date.  Duration of treatment: 3 hours.  Met Uf goal of 2 liters.  Tolerated treatment: Yes.  Hand off to the patient's nurse: Back into his room with stable medical condition via transporter.

## 2023-10-01 DIAGNOSIS — N3000 Acute cystitis without hematuria: Secondary | ICD-10-CM | POA: Diagnosis not present

## 2023-10-01 DIAGNOSIS — I619 Nontraumatic intracerebral hemorrhage, unspecified: Secondary | ICD-10-CM | POA: Diagnosis not present

## 2023-10-01 DIAGNOSIS — D72829 Elevated white blood cell count, unspecified: Secondary | ICD-10-CM | POA: Diagnosis not present

## 2023-10-01 DIAGNOSIS — L89154 Pressure ulcer of sacral region, stage 4: Secondary | ICD-10-CM | POA: Diagnosis not present

## 2023-10-01 LAB — GLUCOSE, CAPILLARY
Glucose-Capillary: 120 mg/dL — ABNORMAL HIGH (ref 70–99)
Glucose-Capillary: 121 mg/dL — ABNORMAL HIGH (ref 70–99)
Glucose-Capillary: 115 mg/dL — ABNORMAL HIGH (ref 70–99)
Glucose-Capillary: 175 mg/dL — ABNORMAL HIGH (ref 70–99)

## 2023-10-01 MED ORDER — DIPHENHYDRAMINE HCL 25 MG PO CAPS
25.0000 mg | ORAL_CAPSULE | Freq: Once | ORAL | Status: DC
  Filled 2023-10-01: qty 1

## 2023-10-01 MED ORDER — CLONIDINE HCL 0.1 MG PO TABS
0.1000 mg | ORAL_TABLET | Freq: Every day | ORAL | Status: DC
  Administered 2023-10-03: 0.1 mg via ORAL
  Filled 2023-10-01 (×2): qty 1

## 2023-10-01 MED ORDER — CLONIDINE HCL 0.1 MG PO TABS
0.1000 mg | ORAL_TABLET | Freq: Two times a day (BID) | ORAL | Status: AC
  Administered 2023-10-01 – 2023-10-02 (×3): 0.1 mg via ORAL
  Filled 2023-10-01 (×3): qty 1

## 2023-10-01 MED ORDER — PIPERACILLIN-TAZOBACTAM IN DEX 2-0.25 GM/50ML IV SOLN
2.2500 g | Freq: Three times a day (TID) | INTRAVENOUS | Status: DC
  Administered 2023-10-01 – 2023-10-04 (×8): 2.25 g via INTRAVENOUS
  Filled 2023-10-01 (×10): qty 50

## 2023-10-01 MED ORDER — CHLORHEXIDINE GLUCONATE CLOTH 2 % EX PADS
6.0000 | MEDICATED_PAD | Freq: Every day | CUTANEOUS | Status: DC
  Administered 2023-10-01 – 2023-10-03 (×3): 6 via TOPICAL

## 2023-10-01 MED ORDER — PIPERACILLIN-TAZOBACTAM 3.375 G IVPB: 3.3750 g | Freq: Three times a day (TID) | INTRAVENOUS | Status: DC

## 2023-10-01 MED ORDER — PIPERACILLIN-TAZOBACTAM 3.375 G IVPB 30 MIN: 3.3750 g | INTRAVENOUS | Status: DC

## 2023-10-01 NOTE — Progress Notes (Signed)
Physical Therapy Treatment Patient Details Name: Bruce Little MRN: [id number] DOB: 01-26-1971 Today's Date: 10/01/2023   History of Present Illness The pt is a 52 yo male presenting 11/8 with intermittent nausea and vomiting, fever, and clogged PEG tube; admitted with SIRS with fever. PEG tube replaced in IR 11/11; stage IV sacral wound as well with wound vac placed. Pt with recent prolonged admission in [place] (04/30/2023- 07/01/2023) after severe CVA requiring craniotomy, tracheostomy, PEG. Following [place] hospitalization, [facility] and was [facility] August 22 through August 10, 2023. PMH: L BKA, DM II, ESRD, HTN, a-fib, OM, CVA.    PT Comments  The pt was agreeable to session, no family was present for training at this time, so session focused on strength and ROM progression as well as attempt at sitting EOB for improved activity tolerance and seated balance practice. The pt needed significant assist to progress to sitting EOB, and mod-maxA to maintain static sitting due to strong pushing towards L with RUE and inability to correct LOB. The pt was educated in exercises for core and trunk that he can complete in bed to assist with bed mobility and independence with bed mobility. Pt verbalized understanding.     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)     Precautions / Restrictions Precautions Precautions: Fall;Other (comment) Precaution Comments: Crani with bone flap still out;  helmet when OOB; reported dizziness sitting EOB, consider supine and sitting BPs Restrictions Weight Bearing Restrictions: No Other Position/Activity Restrictions: chronic L BKA with knee immobilizer, pt reports he wears on LLE, poor ability to achieve L knee extension this session     Mobility  Bed Mobility Overal bed mobility: Needs Assistance Bed Mobility: Rolling, Sidelying to Sit, Sit to Sidelying Rolling: Mod assist, Max assist Sidelying to sit: Max assist, +2 for physical assistance     Sit to sidelying: Max assist, +2 for physical assistance General bed mobility comments: modA to roll to L, maxA to roll to R. cues for use of RLE with rolling. maxA of 2 to achieve sitting EOB due to strong L lateral and posterior lean. pt reports dizziess    Transfers                            Balance Overall balance assessment: Needs assistance Sitting-balance support: Single extremity supported, Feet unsupported Sitting balance-Leahy Scale: Zero Sitting balance - Comments: Pt pushing to L and with strong posterior lean Postural control: Posterior lean, Left lateral lean                                  Cognition Arousal: Alert Behavior During Therapy: WFL for tasks assessed/performed, Flat affect Overall Cognitive Status: History of cognitive impairments - at baseline Area of Impairment: Following commands                       Following Commands: Follows  one step commands with increased time       General Comments: able to follow commands and answer questions consistently and fully participate, though less talkative today        Exercises General Exercises - Lower Extremity Ankle Circles/Pumps: Right, AAROM, 10 reps, Supine Quad Sets: AAROM, Right, 10 reps, Supine Heel Slides: AAROM, Right, 10 reps, Supine Other Exercises Other Exercises: sidelying R hip extenionsion stretch and quad stretch. 3 x 15 sec Other Exercises: supine curl with  diagonal trunk rotation x 5 each way Other Exercises: supine cross-body reaching to increase core activation with assist for scapular mobility Other Exercises: supine transverse abdominal activation x 10    General Comments General comments (skin integrity, edema, etc.): unable to get BP on dynamap      Pertinent Vitals/Pain Pain Assessment Pain Assessment: Faces Faces Pain Scale: Hurts little more Pain Location: back/bottom Pain Descriptors / Indicators: Discomfort, Grimacing Pain Intervention(s): Limited activity within patient's tolerance, Monitored during session, Repositioned     PT Goals (current goals can now be found in the care plan section) Acute Rehab PT Goals Patient Stated Goal: Agreeable to working with PT PT Goal Formulation: Patient unable to participate in goal setting Time For Goal Achievement: 10/05/23 Potential to Achieve Goals: Fair Progress towards PT goals: Progressing toward goals    Frequency    Min 1X/week       AM-PAC PT "6 Clicks" Mobility   Outcome Measure  Help needed turning from your back to your side while in a flat bed without using bedrails?: A Lot Help needed moving from lying on your back to sitting on the side of a flat bed without using bedrails?: Total Help needed moving to and from a bed to a chair (including a wheelchair)?: Total Help needed standing up from a chair using your arms (e.g., wheelchair or bedside chair)?: Total Help needed to walk in hospital room?: Total Help needed climbing 3-5 steps with a railing? : Total 6 Click Score: 7    End of Session   Activity Tolerance: Patient tolerated treatment well Patient left: in bed;with call bell/phone within reach Nurse Communication: Mobility status (dressing needs attention) PT Visit Diagnosis: Other abnormalities of gait and mobility (R26.89);Other symptoms and signs involving the nervous system (R29.898);Hemiplegia and hemiparesis Hemiplegia - Right/Left:  Left Hemiplegia - caused by: Cerebral infarction     Time: 9811-9147 PT Time Calculation (min) (ACUTE ONLY): 59 min  Charges:    $Therapeutic Exercise: 23-37 mins PT General Charges $$ ACUTE PT VISIT: 1 Visit                     Vickki Muff, PT, DPT   Acute Rehabilitation Department Office [phone] Secure Chat Communication Preferred   Ronnie Derby 10/01/2023, 3:08 PM

## 2023-10-01 NOTE — Progress Notes (Signed)
Initial Nutrition Assessment  DOCUMENTATION CODES:   Non-severe (moderate) malnutrition in context of chronic illness  INTERVENTION:  Continue with current DYS NTL Diet. Ensure Plus High Protein po BID, each supplement provides 350 kcal and 20 grams of protein.(Thickened to nectar thick) 1 packet Juven BID, each packet provides 95 calories, 2.5 grams of protein (collagen), and 9.8 grams of carbohydrate (3 grams sugar); also contains 7 grams of L-arginine and L-glutamine, 300 mg vitamin C, 15 mg vitamin E, 1.2 mcg vitamin B-12, 9.5 mg zinc, 200 mg calcium, and 1.5 g  Calcium Beta-hydroxy-Beta-methylbutyrate to support wound healing. 500 mg vitamin C BID x 30 days 50,000 mcg vitamin A x 2 weeks Continue with renal vitamin Hand out and verbal education provided to care giver   NUTRITION DIAGNOSIS:   Moderate Malnutrition related to chronic illness as evidenced by moderate fat depletion, moderate muscle depletion. On going being addressed through interventions.    GOAL:   Patient will meet greater than or equal to 90% of their needs    MONITOR:   Supplement acceptance, PO intake, Diet advancement, Labs, Weight trends, TF tolerance, Skin  REASON FOR ASSESSMENT:   Consult Enteral/tube feeding initiation and management (Wound rectum loose stools,)  ASSESSMENT:   52 year old male who presented to the ED on 11/08 with nausea, UTI, clogged G-tube. 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 Patient not receptive at time of visit.  Wife not in room at time of visit. Most conversation is with wife as to pt. Unable to determine accurate oral intake due to gaps in documentation.  As per last visit suspect pt does better with meals when wife is there to assist him. Suspect current  nutr poc is providing adequate nutrition.    Hospital weight history: 09/29/23 0603 84.2 kg 185.63 lbs  09/28/23 1222 89.4 kg 197.09 lbs  09/28/23 0918 90.7 kg 199.96 lbs  09/25/23 1155 93.6 kg 206.35 lbs  09/25/23 0719 95.6 kg 210.76 lbs  09/24/23 0500 98.1 kg 216.27 lbs  09/23/23 0500 98 kg 216.05 lbs  09/22/23 0500 103.5 kg 228.18 lbs  09/21/23 1731 97.9 kg 215.83 lbs  09/21/23 1323 99.9 kg 220.24 lbs  09/18/23 0307 93.6 kg 206.35 lbs     Nutritionally Relevant Medications: Scheduled Meds:  ascorbic acid  500 mg Oral BID   atorvastatin  40 mg Oral QHS   carvedilol  25 mg Oral BID WC   cloNIDine  0.1 mg Oral BID   diphenhydrAMINE  25 mg Oral Once   feeding supplement  237 mL Oral BID BM   finasteride  5 mg Oral Daily   hydrALAZINE  25 mg Oral Q8H   losartan  50 mg Oral Daily   multivitamin  1 tablet Oral QHS   nutrition supplement (JUVEN)  1 packet Oral BID BM   terazosin  1 mg Oral QHS   vitamin A  50,000 Units Oral Daily   Vitamin D (Ergocalciferol)  50,000 Units Oral Q7 days      Labs Reviewed    NUTRITION - FOCUSED PHYSICAL EXAM:  Flowsheet Row Most Recent Value  Orbital Region Mild depletion  Upper Arm Region Moderate depletion  Thoracic and Lumbar Region Mild depletion  Buccal Region Mild depletion  Temple Region Mild depletion  Clavicle Bone Region No depletion  Clavicle and Acromion  Bone Region Moderate depletion  Scapular Bone Region Moderate depletion  Dorsal Hand Unable to assess  Patellar Region Moderate depletion  Anterior Thigh Region Mild depletion  Posterior Calf Region Mild depletion  Edema (RD Assessment) None  Hair Reviewed  Eyes Reviewed  Mouth Reviewed  Skin Reviewed  Nails Reviewed       Diet Order:   Diet Order             DIET DYS 3 Room service appropriate? Yes with Assist; Fluid consistency: Nectar Thick  Diet effective now                   EDUCATION NEEDS:   Not appropriate for education at this  time  Skin:  Skin Assessment: Reviewed RN Assessment Skin Integrity Issues:: Stage IV, Unstageable, Diabetic Ulcer Stage IV: sacrum Unstageable: R thigh Diabetic Ulcer: R foot, R heel  Last BM:  09/29/23  Height:   Ht Readings from Last 1 Encounters:  09/18/23 6\' 2"  (1.88 m)    Weight:   Wt Readings from Last 1 Encounters:  04/14/23 111.1 kg    Ideal Body Weight:  86.4 kg  BMI:  Body mass index is 23.83 kg/m.  Estimated Nutritional Needs:   Kcal:  2400-2600  Protein:  120-140 grams  Fluid:  >2.2 L    Jamelle Haring RDN, LDN Clinical Dietitian  RDN pager # available on Amion

## 2023-10-01 NOTE — Progress Notes (Signed)
Pharmacy Antibiotic Note  Bruce Little is a 52 y.o. male admitted on 09/17/2023 with complicated UTI. Plan to  restart Zosyn x 7 day course per Dr. Drue Second (?failed ceftaz/chronic foley).  Pharmacy has been consulted for Zosyn dosing.  Pt with ESRD - on HD  Plan: Zosyn 2.25gm IV q8h  Will f/u HD tolerance, micro data, and pt's clinical condition  Height: 6\' 2"  (188 cm) Weight:  (Bed scale iis not working.) IBW/kg (Calculated) : 82.2  Temp (24hrs), Avg:98 F (36.7 C), Min:97.3 F (36.3 C), Max:98.3 F (36.8 C)  Recent Labs  Lab 09/25/23 0640 09/27/23 0440 09/28/23 0535 09/30/23 0553  WBC 8.2 10.5 9.3 14.1*  CREATININE 2.45* 2.42* 2.80* 2.81*    Estimated Creatinine Clearance: 36.2 mL/min (A) (by C-G formula based on SCr of 2.81 mg/dL (H)).    No Known Allergies  Antimicrobials this admission: 11/22 Zosyn >> 11/29 Ceftriaxone 11/18 >>11/9 Ceftazidime 11/10 >>11/16 Medihoney to L buttock 11/14>> Dakins to sacral wound 11/15 >>11/19   Microbiology results: 11/8 blood: neg 11/8 urine: 80k pseudomonas  Thank you for allowing pharmacy to be a part of this patient's care.  Christoper Fabian, PharmD, BCPS Please see amion for complete clinical pharmacist phone list 10/01/2023 2:44 PM

## 2023-10-01 NOTE — Consult Note (Signed)
WOC Nurse wound follow up Refer to consult note from yesterday.  Vac is intact with good seal, cont suction on at .  WOC team will plan to change Mon if patient is still in the hospital at that time.  Topical treatment orders provided for bedside nurses as follows: If Vac is leaking or alarming related to stooling, please remove and apply moist gauze dressing and ABD pad and tape.  Thank-you,  Cammie Mcgee MSN, RN, CWOCN, [place], CNS [phone]

## 2023-10-01 NOTE — Progress Notes (Signed)
[facility] Progress Note   Subjective:   Pt sleeping in room, opens eyes briefly to voice then falls back asleep  Objective Vitals:   09/30/23 1548 09/30/23 2119 10/01/23 0533 10/01/23 0851  BP: 121/80 120/72 127/77 115/74  Pulse: 80 73 76 78  Resp: 18 18 18 18   Temp: (!) 97.3 F (36.3 C) 98.3 F (36.8 C) 98.3 F (36.8 C) 98 F (36.7 C)  TempSrc: Oral   Oral  SpO2: 100% 100% 96% 96%  Weight:      Height:       Physical Exam General: Sleeping male, awakens to voice, NAD Heart: RRR, no murmurs, rubs or gallops Lungs: CTA anteriorly, respirations unlabored Abdomen: +BS Extremities: No edema b/l lower extremities Dialysis Access:  [facility] with intact bandage  Additional Objective Labs: Basic Metabolic Panel: Recent Labs  Lab 09/25/23 0421 09/25/23 0640 09/25/23 0640 09/27/23 0440 09/28/23 0535 09/30/23 0553  NA  --  132*   < > 130* 131* 131*  K  --  4.6   < > 4.4 4.5 4.4  CL  --  100   < > 100 102 101  CO2  --  25   < > 24 23 24   GLUCOSE  --  126*   < > 112* 102* 133*  BUN  --  15   < > 28* 31* 35*  CREATININE  --  2.45*   < > 2.42* 2.80* 2.81*  CALCIUM  --  8.1*   < > 8.0* 8.2* 8.1*  PHOS 3.2 3.2  --   --  3.7  --    < > = values in this interval not displayed.   Liver Function Tests: Recent Labs  Lab 09/25/23 0640 09/28/23 0535  ALBUMIN <1.5* 1.5*   No results for input(s): "LIPASE", "AMYLASE" in the last 168 hours. CBC: Recent Labs  Lab 09/25/23 0640 09/27/23 0440 09/28/23 0535 09/30/23 0553  WBC 8.2 10.5 9.3 14.1*  HGB 7.5* 7.8* 7.6* 8.0*  HCT 22.9* 23.9* 22.5* 23.6*  MCV 95.4 96.4 94.9 95.9  PLT 199 227 253 297   Blood Culture    Component Value Date/Time   SDES BLOOD RIGHT ARM 09/17/2023 2024   SPECREQUEST  09/17/2023 2024    BOTTLES DRAWN AEROBIC AND ANAEROBIC Blood Culture adequate volume   CULT  09/17/2023 2024    NO GROWTH 5 DAYS Performed at [facility], [address] [place], [place] [address]     REPTSTATUS 09/22/2023 FINAL 09/17/2023 2024    Cardiac Enzymes: No results for input(s): "CKTOTAL", "CKMB", "CKMBINDEX", "TROPONINI" in the last 168 hours. CBG: Recent Labs  Lab 09/30/23 0502 09/30/23 0818 09/30/23 1552 09/30/23 2120 10/01/23 0740  GLUCAP 130* 125* 173* 201* 120*   Iron Studies: No results for input(s): "IRON", "TIBC", "TRANSFERRIN", "FERRITIN" in the last 72 hours. @lablastinr3 @ Studies/Results: VAS [place] UPPER EXT VEIN MAPPING (PRE-OP AVF)  Result Date: 09/29/2023 UPPER EXTREMITY VEIN MAPPING Patient Name:  Bruce Little  Date of Exam:   09/29/2023 Medical Rec #: [id number]     Accession #:    [id number] Date of Birth: 1971-01-21     Patient Gender: M Patient Age:   52 years Exam Location:  [facility] Procedure:      VAS [place] UPPER EXT VEIN MAPPING (PRE-OP AVF) Referring Phys: [facility] --------------------------------------------------------------------------------  Indications: History of PAD; patient is pre-operative for bypass. Comparison Study: No prior study Performing Technologist: Shona Simpson  Examination Guidelines: A complete evaluation  includes B-mode imaging, spectral Doppler, color Doppler, and power Doppler as needed of all accessible portions of each vessel. Bilateral testing is considered an integral part of a complete examination. Limited examinations for reoccurring indications may be performed as noted. +-----------------+-------------+----------+---------+ Right Cephalic   Diameter (cm)Depth (cm)Findings  +-----------------+-------------+----------+---------+ Shoulder             0.46        1.22             +-----------------+-------------+----------+---------+ Prox upper arm       0.36        0.59             +-----------------+-------------+----------+---------+ Mid upper arm        0.37        0.36             +-----------------+-------------+----------+---------+ Dist upper arm       0.40        0.42   branching  +-----------------+-------------+----------+---------+ Antecubital fossa    0.63        0.15   branching +-----------------+-------------+----------+---------+ Prox forearm         0.45        0.46             +-----------------+-------------+----------+---------+ Mid forearm          0.41        0.41             +-----------------+-------------+----------+---------+ Dist forearm         0.34        0.22             +-----------------+-------------+----------+---------+ Wrist                0.37        0.22   branching +-----------------+-------------+----------+---------+ +-----------------+-------------+----------+--------------+ Right Basilic    Diameter (cm)Depth (cm)   Findings    +-----------------+-------------+----------+--------------+ Shoulder                                not visualized +-----------------+-------------+----------+--------------+ Prox upper arm                          not visualized +-----------------+-------------+----------+--------------+ Mid upper arm                           not visualized +-----------------+-------------+----------+--------------+ Dist upper arm       0.80        1.33     branching    +-----------------+-------------+----------+--------------+ Antecubital fossa    0.78        1.50     branching    +-----------------+-------------+----------+--------------+ Prox forearm         0.42        0.18     branching    +-----------------+-------------+----------+--------------+ Mid forearm          0.41        0.18                  +-----------------+-------------+----------+--------------+ Distal forearm       0.34        0.22                  +-----------------+-------------+----------+--------------+ Wrist                0.30  0.21                  +-----------------+-------------+----------+--------------+ +-----------------+-------------+----------+---------+ Left Cephalic    Diameter  (cm)Depth (cm)Findings  +-----------------+-------------+----------+---------+ Shoulder             0.30        1.68             +-----------------+-------------+----------+---------+ Prox upper arm       0.30        0.90             +-----------------+-------------+----------+---------+ Mid upper arm        0.30        0.43             +-----------------+-------------+----------+---------+ Dist upper arm       0.29        0.52   branching +-----------------+-------------+----------+---------+ Antecubital fossa    0.47        0.53   branching +-----------------+-------------+----------+---------+ Prox forearm         0.19        0.71   branching +-----------------+-------------+----------+---------+ Mid forearm          0.24        0.41             +-----------------+-------------+----------+---------+ Dist forearm         0.15        0.21   branching +-----------------+-------------+----------+---------+ Wrist                0.15        0.23             +-----------------+-------------+----------+---------+ +-----------------+-------------+----------+--------------+ Left Basilic     Diameter (cm)Depth (cm)   Findings    +-----------------+-------------+----------+--------------+ Shoulder                                not visualized +-----------------+-------------+----------+--------------+ Prox upper arm       0.51        1.47     branching    +-----------------+-------------+----------+--------------+ Mid upper arm        0.39        1.14                  +-----------------+-------------+----------+--------------+ Dist upper arm       0.46        0.79                  +-----------------+-------------+----------+--------------+ Antecubital fossa    0.70        0.39     branching    +-----------------+-------------+----------+--------------+ Prox forearm         0.33        0.32                   +-----------------+-------------+----------+--------------+ Mid forearm          0.30        0.35                  +-----------------+-------------+----------+--------------+ Distal forearm       0.26        0.25                  +-----------------+-------------+----------+--------------+ Wrist                0.30        0.23  branching    +-----------------+-------------+----------+--------------+ *See table(s) above for measurements and observations.  Diagnosing physician: Heath Lark Electronically signed by Heath Lark on 09/29/2023 at 6:12:04 PM.    Final    Medications:   amiodarone  100 mg Oral Daily   amLODipine  10 mg Oral Daily   ascorbic acid  500 mg Oral BID   atorvastatin  40 mg Oral QHS   carvedilol  25 mg Oral BID WC   Chlorhexidine Gluconate Cloth  6 each Topical Q0600   cloNIDine  0.1 mg Oral TID   [START ON 10/02/2023] darbepoetin (ARANESP) injection - DIALYSIS  100 mcg Subcutaneous Q Sat-1800   diphenhydrAMINE  25 mg Oral Once   feeding supplement  237 mL Oral BID BM   finasteride  5 mg Oral Daily   hydrALAZINE  25 mg Oral Q8H   influenza vac split trivalent PF  0.5 mL Intramuscular Tomorrow-1000   insulin aspart  0-5 Units Subcutaneous QHS   insulin aspart  0-6 Units Subcutaneous TID WC   insulin glargine-yfgn  5 Units Subcutaneous Daily   leptospermum manuka honey  1 Application Topical Daily   levETIRAcetam  1,500 mg Oral BID   losartan  50 mg Oral Daily   multivitamin  1 tablet Oral QHS   nutrition supplement (JUVEN)  1 packet Oral BID BM   pantoprazole  40 mg Oral BID   PARoxetine  10 mg Oral Daily   sodium chloride flush  3 mL Intravenous Q12H   terazosin  1 mg Oral QHS   vitamin A  50,000 Units Oral Daily   Vitamin D (Ergocalciferol)  50,000 Units Oral Q7 days    [facility] 500/AF 1.5,  EDW 98.6kg, 3.0 K/ 2.5Ca TDC  -No Heparin  - Mircera 50 mcg q 2 weeks - Not started yet - Hectorol IV TIW    Assessment/Plan: Vomiting - Daily for months, now resolved per patient, G tube malfunction.  Noted IR 11/11 consult "both lumens flush now easily."  Management per admit team tolerating bolus feedings currently.  ALB less than 1.5 dietary consulted getting supplements Hyponatremia-Na in 130's. Volume management with HD.  Suspect this is a chronic issue. Follow labs.   ESRD -  On HD TTS. Started during prolonged hospitalization but previously followed for CKD stage 5 planning for HD.  Suspect muscle wasting causing falsely low SCr. Truncated HD 09/28/2023.  Next HD 10/02/2023 Leukocytosis - WBC RESOLVING WORKUP PER ADMIT AND ID consulted.  UTI suspected, ABX started.  Blood cultures obtained.  Has sacral decub status post 11/11 surgical bedside debridement, wound care following, Peg tube malfunction - IR  consulted.  As above.  And appears functional now further plans per PMD  Hypertension/volume  - BP well controlled. He did have pleural effusions noted on CT. Variable weights here. Continue UF with HD as tolerated.   Anemia of CKD -Hgb 8.0 s/p 1 unit PRBC. Aranesp 60 mcg weekly.Increase dose 09/28/2023 to 100 mcg SQ. Follow HGB.   Secondary Hyperparathyroidism -  CCa  10.2 , Phos dropped to 1's but improved with supplementation. Recheck with labs tomorrow.   Nutrition - Regular diet, follow labs. Alb<1.5.  Low phosphorus, K okay add protein supplements.  Dietary consulted by admit team Hx hemorrhagic stroke -left-sided hemiparesis -Rx Per admit team/noted previous rehab center admit and was at home prior to this admission Hypokalemia -resolved. Follow labs.  [facility] consult for permanent access. Patient's wife is interested in home HD.  Dr. Valentino Nose is aware. Unfortunately Dr. Lenell Antu does not find him stable enough at present to proceed with permanent access at this time. Will return to [facility] as OP.   Rogers Blocker, PA-C 10/01/2023, 10:16 AM  [facility] Pager: [phone]

## 2023-10-01 NOTE — Progress Notes (Signed)
Occupational Therapy Treatment Patient Details Name: Bruce Little MRN: [id number] DOB: 03/25/1971 Today's Date: 10/01/2023   History of present illness The pt is a 52 yo male presenting 11/8 with intermittent nausea and vomiting, fever, and clogged PEG tube; admitted with SIRS with fever. PEG tube replaced in IR 11/11; stage IV sacral wound as well with wound vac placed. Pt with recent prolonged admission in [place] (04/30/2023- 07/01/2023) after severe CVA requiring craniotomy, tracheostomy, PEG. Following [place] hospitalization, [facility] and was [facility] August 22 through August 10, 2023. PMH: L BKA, DM II, ESRD, HTN, a-fib, OM, CVA.   OT comments  Pt needing significant assist for bed mobility, not tolerable to upright sitting position but dynomap not able to obtain a BP. Once upright pt with strong push to L, needing facilitation of R hand to reposition in a more optimal position to place pt at midline. Pt tolerating 48* in chair position without dizziness to perform cross body reaching with core engagement, educated pt on use bed features to slowly increase the incline of head to progressively get use to tolerating sitting upright. Stretching of the LLE/RLE was performed as pt was stiff and had contractures, pt with c/o back pain and was repositioned appropriately.       If plan is discharge home, recommend the following:  Two people to help with walking and/or transfers;Two people to help with bathing/dressing/bathroom;Assistance with cooking/housework;Assistance with feeding;Direct supervision/assist for medications management;Direct supervision/assist for financial management;Assist for transportation;Help with stairs or ramp for entrance   Equipment Recommendations  None recommended by OT    Recommendations for Other Services      Precautions / Restrictions Precautions Precautions: Fall;Other (comment) Precaution Comments: Crani with bone flap still out; helmet  when OOB; reported dizziness sitting EOB, consider supine and sitting BPs Restrictions Weight Bearing Restrictions: No LLE Weight Bearing: Non weight bearing Other Position/Activity Restrictions: chronic L BKA with knee immobilizer, pt reports he wears on LLE, poor ability to achieve L knee extension this session       Mobility Bed Mobility Overal bed mobility: Needs Assistance Bed Mobility: Rolling, Sidelying to Sit, Sit to Sidelying Rolling: Mod assist, Max assist Sidelying to sit: Max assist, +2 for physical assistance     Sit to sidelying: Max assist, +2 for physical assistance General bed mobility comments: modA to roll to L, maxA to roll to R. cues for use of RLE with rolling. maxA of 2 to achieve sitting EOB due to strong L lateral and posterior lean. pt reports dizziess    Transfers                         Balance Overall balance assessment: Needs assistance Sitting-balance support: Single extremity supported, Feet unsupported Sitting balance-Leahy Scale: Zero Sitting balance - Comments: Pt pushing to L and with strong posterior lean Postural control: Posterior lean, Left lateral lean                                 ADL either performed or assessed with clinical judgement   ADL                                         General ADL Comments: Focused session on mobility and performing some cross body reaching to engage  core muscles with bed in chair position    Extremity/Trunk Assessment Upper Extremity Assessment LUE Deficits / Details: L hemi, flaccid, no sensation            Vision       Perception     Praxis      Cognition Arousal: Alert Behavior During Therapy: WFL for tasks assessed/performed, Flat affect Overall Cognitive Status: History of cognitive impairments - at baseline Area of Impairment: Following commands                       Following Commands: Follows one step commands with increased  time       General Comments: able to follow commands and answer questions consistently and fully participate, though less talkative today        Exercises General Exercises - Lower Extremity Ankle Circles/Pumps: Right, AAROM, 10 reps, Supine Quad Sets: AAROM, Right, 10 reps, Supine Heel Slides: AAROM, Right, 10 reps, Supine    Shoulder Instructions       General Comments unable to get BP on dynamp. Secretary contacted to notify RN the need for her to inspect dressing and wound vac as pt was having BM which may have been impacting both    Pertinent Vitals/ Pain       Pain Assessment Pain Assessment: Faces Faces Pain Scale: Hurts little more Pain Location: back/bottom Pain Descriptors / Indicators: Discomfort, Grimacing Pain Intervention(s): Limited activity within patient's tolerance, Monitored during session, Repositioned  Home Living                                          Prior Functioning/Environment              Frequency  Min 1X/week        Progress Toward Goals  OT Goals(current goals can now be found in the care plan section)  Progress towards OT goals: Progressing toward goals  Acute Rehab OT Goals Patient Stated Goal: to return home OT Goal Formulation: With patient/family Time For Goal Achievement: 10/05/23 Potential to Achieve Goals: Fair  Plan      Co-evaluation    PT/OT/SLP Co-Evaluation/Treatment: Yes Reason for Co-Treatment: Complexity of the patient's impairments (multi-system involvement) PT goals addressed during session: Strengthening/ROM;Mobility/safety with mobility;Balance OT goals addressed during session: ADL's and self-care      AM-PAC OT "6 Clicks" Daily Activity     Outcome Measure   Help from another person eating meals?: A Little Help from another person taking care of personal grooming?: A Lot Help from another person toileting, which includes using toliet, bedpan, or urinal?: Total Help from  another person bathing (including washing, rinsing, drying)?: Total Help from another person to put on and taking off regular upper body clothing?: A Lot Help from another person to put on and taking off regular lower body clothing?: Total 6 Click Score: 10    End of Session    OT Visit Diagnosis: Other abnormalities of gait and mobility (R26.89);Muscle weakness (generalized) (M62.81);Other symptoms and signs involving cognitive function;Pain;Hemiplegia and hemiparesis Hemiplegia - Right/Left: Left Hemiplegia - dominant/non-dominant: Non-Dominant Pain - part of body:  (sacral wound)   Activity Tolerance Patient tolerated treatment well   Patient Left in bed;with call bell/phone within reach   Nurse Communication Mobility status        Time: 0981-1914 OT Time Calculation (min): 54 min  Charges: OT General Charges $OT Visit: 1 Visit OT Treatments $Therapeutic Activity: 23-37 mins  10/01/2023  AB, OTR/L  Acute Rehabilitation Services  Office: [phone]   Tristan Schroeder 10/01/2023, 3:32 PM

## 2023-10-01 NOTE — Progress Notes (Signed)
PROGRESS NOTE    Bruce Little  ZHY:[id number] DOB: 10/26/1971 DOA: 09/17/2023 PCP: Westley Hummer, MD   Brief Narrative:  This is a 52 year old male with DM, PAF on Eliquis in the past, CKD now on dialysis, left BKA who comes into the hospital with intermittent nausea and vomiting, fever, and clogged PEG tube.   He has had a prolonged hospitalization at [facility] in [place] in August, admitted there 04/30/2023 and discharged 07/01/2023.  Hospital course reviewed.  He was visiting [place] from [place], was in a hotel when he was found to have altered mental status, vomiting.  He was found to be hypertensive in the ER with a blood pressure of 226/100, and a CT of the head showed 9.2 x 5.5 cm right frontal temporal parenchymal bleed with edema, mass effect and 1.1 cm left midline shift.  He is status post craniectomy and hematoma evacuation and EVD placement.  Hospital course complicated by Staph epidermidis in the CSF 7/17, will repeat growth 7/22 and 7/24.  Eventually EVD was removed, and there were no plans to replace the bone flap and will need artificial plate eventually.  He will developed sacral decubitus ulcer and underwent serial debridements, sacral wounds grew E. coli, Morganella and Enterococcus faecalis and placed of antibiotics for several weeks.  Hospital course was also complicated by C. difficile diarrhea status post full course of vancomycin while being on IV antibiotics also, and in addition, had a PEG and a trach and developed renal failure requiring dialysis.   Following [place] hospitalization, [facility] and he was there August 22 through August 10, 2023.  Worth mentioning is that he was decannulated on 07/23/2023, and weaned off to room air, and also with ongoing SLP he was started on p.o. intake along with his PEG tube.  He has been in rehab through October, but apparently has been home for couple of weeks prior to being  here.  Assessment & Plan:   Principal Problem:   Vomiting Active Problems:   Renal insufficiency   HTN (hypertension)   UTI (urinary tract infection)   Leukocytosis   Sacral decubitus ulcer   ICH (intracerebral hemorrhage) (HCC)   Malnutrition of moderate degree  Sepsis secondary to presumed UTI, POA, resolved Concern for recurrent UTI versus undertreated UTI -patient was febrile to 100.7 on admission, and had leukocytosis.  Previously completed antibiotics on 11/16 -given worsening leukocytosis and abnormal urine we will restart 7-day antibiotic course per discussion with ID -consider Foley exchange if patient's urine does not clear up in the next 3 to 4 days.   History of PAF-currently in sinus rhythm.  He is on amiodarone, Coreg, continue.  No longer on anticoagulation following his ICH in August   Nausea, vomiting, diarrhea induced by tube feeds-tells me he has been having some nausea but no further vomiting.  Will discus with the wife about reinitiation of tube feeds and maybe use Imodium -Discussed with wife at bedside, she wishes for patient to have p.o. intake and not have tube feeds due to persistent diarrhea, encourage nutritional supplementations   History of ICH-closely monitor mental status.  CT scan done on admission does not show any evidence of recurrent bleed.  He has established with local neurosurgery to have a plate fitted -Patient had an episode of right arm involuntary movement, concern for seizure.  Repeat CT scan unremarkable.  EEG unremarkable.  Of note, I saw him within a minute following episode and he  had no postictal confusion   ESRD-nephrology following, continue dialysis per hospital schedule while here.    Chronic urinary retention-with chronic Foley   Stage IV sacral decubitus ulcer, POA-CT scan this admission showed resorption of the inferior sacrum and coccyx without abscess.  ID consulted as well.  Underwent debridement by general surgery in August,  and no further debridements were recommended following his [facility] stay -Surgery consulted, underwent bedside debridement 11/11.  -Currently stable with a wound VAC, no longer has diarrhea after tube feeds were discontinued.  Case management looking into arranging wound VAC for home prior to discharge.   Hyponatremia-in the setting of renal disease   Hypokalemia-continue to monitor and replenish as indicated   Vitamin D deficiency-start supplementation   Anemia-of chronic renal disease as well as chronic illness.  Transfuse unit of packed red blood cells for hemoglobin of 6.7.  Hemoglobin improved appropriately, today CBC pending   Status post trach-now decannulated, on room air   History of seizures-continue Keppra   Essential hypertension-continue antihypertensives as below, blood pressure stable   Type 2 diabetes mellitus-overall stable CBGs    DVT prophylaxis: SCDs Start: 09/17/23 2336   Code Status:   Code Status: Full Code  Family Communication: Not present  Status is: Inpt  Dispo: The patient is from: Home              Anticipated d/c is to: Home              Anticipated d/c date is: 24-48h              Patient currently is medically stable for discharge  Consultants:  Nephrology, vascular surgery  Antimicrobials:  Completed   Subjective: No acute issues/events overnight  Objective: Vitals:   09/30/23 1259 09/30/23 1548 09/30/23 2119 10/01/23 0533  BP: 124/79 121/80 120/72 127/77  Pulse: 79 80 73 76  Resp: 18 18 18 18   Temp: 97.7 F (36.5 C) (!) 97.3 F (36.3 C) 98.3 F (36.8 C) 98.3 F (36.8 C)  TempSrc: Oral Oral    SpO2: 100% 100% 100% 96%  Weight:      Height:        Intake/Output Summary (Last 24 hours) at 10/01/2023 0756 Last data filed at 10/01/2023 0656 Gross per 24 hour  Intake 200 ml  Output 3600 ml  Net -3400 ml   Filed Weights   09/28/23 0918 09/28/23 1222 09/29/23 0603  Weight: 90.7 kg 89.4 kg 84.2 kg     Examination:  General exam: Appears calm and comfortable  Respiratory system: Clear to auscultation. Respiratory effort normal. Cardiovascular system: S1 & S2 heard, RRR. No JVD, murmurs, rubs, gallops or clicks. No pedal edema. Gastrointestinal system: Abdomen is nondistended, soft and nontender. No organomegaly or masses felt. Normal bowel sounds heard. Central nervous system: Alert and oriented. No focal neurological deficits. Extremities: Symmetric 5 x 5 power. Skin: No rashes, lesions or ulcers Psychiatry: Judgement and insight appear normal. Mood & affect appropriate.     Data Reviewed: I have personally reviewed following labs and imaging studies  CBC: Recent Labs  Lab 09/25/23 0640 09/27/23 0440 09/28/23 0535 09/30/23 0553  WBC 8.2 10.5 9.3 14.1*  HGB 7.5* 7.8* 7.6* 8.0*  HCT 22.9* 23.9* 22.5* 23.6*  MCV 95.4 96.4 94.9 95.9  PLT 199 227 253 297   Basic Metabolic Panel: Recent Labs  Lab 09/25/23 0421 09/25/23 0640 09/27/23 0440 09/28/23 0535 09/30/23 0553  NA  --  132* 130* 131* 131*  K  --  4.6 4.4 4.5 4.4  CL  --  100 100 102 101  CO2  --  25 24 23 24   GLUCOSE  --  126* 112* 102* 133*  BUN  --  15 28* 31* 35*  CREATININE  --  2.45* 2.42* 2.80* 2.81*  CALCIUM  --  8.1* 8.0* 8.2* 8.1*  MG  --   --   --  1.7  --   PHOS 3.2 3.2  --  3.7  --    GFR: Estimated Creatinine Clearance: 36.2 mL/min (A) (by C-G formula based on SCr of 2.81 mg/dL (H)). Liver Function Tests: Recent Labs  Lab 09/25/23 0640 09/28/23 0535  ALBUMIN <1.5* 1.5*   No results for input(s): "LIPASE", "AMYLASE" in the last 168 hours. No results for input(s): "AMMONIA" in the last 168 hours. Coagulation Profile: No results for input(s): "INR", "PROTIME" in the last 168 hours. Cardiac Enzymes: No results for input(s): "CKTOTAL", "CKMB", "CKMBINDEX", "TROPONINI" in the last 168 hours. BNP (last 3 results) No results for input(s): "PROBNP" in the last 8760 hours. HbA1C: No results  for input(s): "HGBA1C" in the last 72 hours. CBG: Recent Labs  Lab 09/30/23 0502 09/30/23 0818 09/30/23 1552 09/30/23 2120 10/01/23 0740  GLUCAP 130* 125* 173* 201* 120*   Lipid Profile: No results for input(s): "CHOL", "HDL", "LDLCALC", "TRIG", "CHOLHDL", "LDLDIRECT" in the last 72 hours. Thyroid Function Tests: No results for input(s): "TSH", "T4TOTAL", "FREET4", "T3FREE", "THYROIDAB" in the last 72 hours. Anemia Panel: No results for input(s): "VITAMINB12", "FOLATE", "FERRITIN", "TIBC", "IRON", "RETICCTPCT" in the last 72 hours. Sepsis Labs: No results for input(s): "PROCALCITON", "LATICACIDVEN" in the last 168 hours.  No results found for this or any previous visit (from the past 240 hour(s)).       Radiology Studies: VAS [place] UPPER EXT VEIN MAPPING (PRE-OP AVF)  Result Date: 09/29/2023 UPPER EXTREMITY VEIN MAPPING Patient Name:  Bruce Little  Date of Exam:   09/29/2023 Medical Rec #: [id number]     Accession #:    [id number] Date of Birth: [birth year]     Patient Gender: M Patient Age:   89 years Exam Location:  [facility] Procedure:      VAS [place] UPPER EXT VEIN MAPPING (PRE-OP AVF) Referring Phys: [facility] --------------------------------------------------------------------------------  Indications: History of PAD; patient is pre-operative for bypass. Comparison Study: No prior study Performing Technologist: Shona Simpson  Examination Guidelines: A complete evaluation includes B-mode imaging, spectral Doppler, color Doppler, and power Doppler as needed of all accessible portions of each vessel. Bilateral testing is considered an integral part of a complete examination. Limited examinations for reoccurring indications may be performed as noted. +-----------------+-------------+----------+---------+ Right Cephalic   Diameter (cm)Depth (cm)Findings  +-----------------+-------------+----------+---------+ Shoulder             0.46        1.22              +-----------------+-------------+----------+---------+ Prox upper arm       0.36        0.59             +-----------------+-------------+----------+---------+ Mid upper arm        0.37        0.36             +-----------------+-------------+----------+---------+ Dist upper arm       0.40        0.42   branching +-----------------+-------------+----------+---------+ Antecubital fossa    0.63  0.15   branching +-----------------+-------------+----------+---------+ Prox forearm         0.45        0.46             +-----------------+-------------+----------+---------+ Mid forearm          0.41        0.41             +-----------------+-------------+----------+---------+ Dist forearm         0.34        0.22             +-----------------+-------------+----------+---------+ Wrist                0.37        0.22   branching +-----------------+-------------+----------+---------+ +-----------------+-------------+----------+--------------+ Right Basilic    Diameter (cm)Depth (cm)   Findings    +-----------------+-------------+----------+--------------+ Shoulder                                not visualized +-----------------+-------------+----------+--------------+ Prox upper arm                          not visualized +-----------------+-------------+----------+--------------+ Mid upper arm                           not visualized +-----------------+-------------+----------+--------------+ Dist upper arm       0.80        1.33     branching    +-----------------+-------------+----------+--------------+ Antecubital fossa    0.78        1.50     branching    +-----------------+-------------+----------+--------------+ Prox forearm         0.42        0.18     branching    +-----------------+-------------+----------+--------------+ Mid forearm          0.41        0.18                   +-----------------+-------------+----------+--------------+ Distal forearm       0.34        0.22                  +-----------------+-------------+----------+--------------+ Wrist                0.30        0.21                  +-----------------+-------------+----------+--------------+ +-----------------+-------------+----------+---------+ Left Cephalic    Diameter (cm)Depth (cm)Findings  +-----------------+-------------+----------+---------+ Shoulder             0.30        1.68             +-----------------+-------------+----------+---------+ Prox upper arm       0.30        0.90             +-----------------+-------------+----------+---------+ Mid upper arm        0.30        0.43             +-----------------+-------------+----------+---------+ Dist upper arm       0.29        0.52   branching +-----------------+-------------+----------+---------+ Antecubital fossa    0.47        0.53   branching +-----------------+-------------+----------+---------+ Prox forearm         0.19  0.71   branching +-----------------+-------------+----------+---------+ Mid forearm          0.24        0.41             +-----------------+-------------+----------+---------+ Dist forearm         0.15        0.21   branching +-----------------+-------------+----------+---------+ Wrist                0.15        0.23             +-----------------+-------------+----------+---------+ +-----------------+-------------+----------+--------------+ Left Basilic     Diameter (cm)Depth (cm)   Findings    +-----------------+-------------+----------+--------------+ Shoulder                                not visualized +-----------------+-------------+----------+--------------+ Prox upper arm       0.51        1.47     branching    +-----------------+-------------+----------+--------------+ Mid upper arm        0.39        1.14                   +-----------------+-------------+----------+--------------+ Dist upper arm       0.46        0.79                  +-----------------+-------------+----------+--------------+ Antecubital fossa    0.70        0.39     branching    +-----------------+-------------+----------+--------------+ Prox forearm         0.33        0.32                  +-----------------+-------------+----------+--------------+ Mid forearm          0.30        0.35                  +-----------------+-------------+----------+--------------+ Distal forearm       0.26        0.25                  +-----------------+-------------+----------+--------------+ Wrist                0.30        0.23     branching    +-----------------+-------------+----------+--------------+ *See table(s) above for measurements and observations.  Diagnosing physician: Heath Lark Electronically signed by Heath Lark on 09/29/2023 at 6:12:04 PM.    Final         Scheduled Meds:  amiodarone  100 mg Oral Daily   amLODipine  10 mg Oral Daily   ascorbic acid  500 mg Oral BID   atorvastatin  40 mg Oral QHS   carvedilol  25 mg Oral BID WC   Chlorhexidine Gluconate Cloth  6 each Topical Q0600   cloNIDine  0.1 mg Oral TID   [START ON 10/02/2023] darbepoetin (ARANESP) injection - DIALYSIS  100 mcg Subcutaneous Q Sat-1800   diphenhydrAMINE  25 mg Oral Once   feeding supplement  237 mL Oral BID BM   finasteride  5 mg Oral Daily   hydrALAZINE  25 mg Oral Q8H   influenza vac split trivalent PF  0.5 mL Intramuscular Tomorrow-1000   insulin aspart  0-5 Units Subcutaneous QHS   insulin aspart  0-6 Units Subcutaneous TID WC   insulin glargine-yfgn  5 Units Subcutaneous Daily   leptospermum manuka honey  1 Application Topical Daily   levETIRAcetam  1,500 mg Oral BID   losartan  50 mg Oral Daily   multivitamin  1 tablet Oral QHS   nutrition supplement (JUVEN)  1 packet Oral BID BM   pantoprazole  40 mg Oral BID   PARoxetine   10 mg Oral Daily   sodium chloride flush  3 mL Intravenous Q12H   terazosin  1 mg Oral QHS   vitamin A  50,000 Units Oral Daily   Vitamin D (Ergocalciferol)  50,000 Units Oral Q7 days   Continuous Infusions:     LOS: 14 days   Time spent: 25 min  [name], DO [facility]  If 7PM-7AM, please contact night-coverage [email or web address]  10/01/2023, 7:56 AM

## 2023-10-02 DIAGNOSIS — N289 Disorder of kidney and ureter, unspecified: Secondary | ICD-10-CM | POA: Diagnosis not present

## 2023-10-02 DIAGNOSIS — N390 Urinary tract infection, site not specified: Secondary | ICD-10-CM | POA: Diagnosis not present

## 2023-10-02 DIAGNOSIS — A419 Sepsis, unspecified organism: Secondary | ICD-10-CM | POA: Diagnosis not present

## 2023-10-02 DIAGNOSIS — L89154 Pressure ulcer of sacral region, stage 4: Secondary | ICD-10-CM | POA: Diagnosis not present

## 2023-10-02 LAB — CBC
HCT: 28 % — ABNORMAL LOW (ref 39.0–52.0)
Hemoglobin: 9.2 g/dL — ABNORMAL LOW (ref 13.0–17.0)
MCH: 31.6 pg (ref 26.0–34.0)
MCHC: 32.9 g/dL (ref 30.0–36.0)
MCV: 96.2 fL (ref 80.0–100.0)
Platelets: 328 10*3/uL (ref 150–400)
RBC: 2.91 MIL/uL — ABNORMAL LOW (ref 4.22–5.81)
RDW: 16 % — ABNORMAL HIGH (ref 11.5–15.5)
WBC: 14.7 10*3/uL — ABNORMAL HIGH (ref 4.0–10.5)
nRBC: 0 % (ref 0.0–0.2)

## 2023-10-02 LAB — GLUCOSE, CAPILLARY
Glucose-Capillary: 104 mg/dL — ABNORMAL HIGH (ref 70–99)
Glucose-Capillary: 123 mg/dL — ABNORMAL HIGH (ref 70–99)
Glucose-Capillary: 115 mg/dL — ABNORMAL HIGH (ref 70–99)
Glucose-Capillary: 139 mg/dL — ABNORMAL HIGH (ref 70–99)

## 2023-10-02 LAB — RENAL FUNCTION PANEL
Albumin: 2 g/dL — ABNORMAL LOW (ref 3.5–5.0)
Anion gap: 11 (ref 5–15)
BUN: 10 mg/dL (ref 6–20)
CO2: 27 mmol/L (ref 22–32)
Calcium: 8.2 mg/dL — ABNORMAL LOW (ref 8.9–10.3)
Chloride: 97 mmol/L — ABNORMAL LOW (ref 98–111)
Creatinine, Ser: 1.59 mg/dL — ABNORMAL HIGH (ref 0.61–1.24)
GFR, Estimated: 52 mL/min — ABNORMAL LOW (ref >60)
Glucose, Bld: 114 mg/dL — ABNORMAL HIGH (ref 70–99)
Phosphorus: 1.9 mg/dL — ABNORMAL LOW (ref 2.5–4.6)
Potassium: 3.4 mmol/L — ABNORMAL LOW (ref 3.5–5.1)
Sodium: 135 mmol/L (ref 135–145)

## 2023-10-02 MED ORDER — CARVEDILOL 25 MG PO TABS: 25.0000 mg | ORAL_TABLET | Freq: Two times a day (BID) | ORAL | 0 refills | Status: AC

## 2023-10-02 MED ORDER — RENA-VITE PO TABS: 1.0000 | ORAL_TABLET | Freq: Every day | ORAL | 0 refills | Status: DC

## 2023-10-02 MED ORDER — VITAMIN D (ERGOCALCIFEROL) 1.25 MG (50000 UNIT) PO CAPS: 50000.0000 [IU] | ORAL_CAPSULE | ORAL | 0 refills | Status: DC

## 2023-10-02 MED ORDER — MEDIHONEY WOUND/BURN DRESSING EX PSTE: 1.0000 | PASTE | Freq: Every day | CUTANEOUS | 0 refills | Status: DC

## 2023-10-02 MED ORDER — TRAMADOL HCL 50 MG PO TABS
25.0000 mg | ORAL_TABLET | Freq: Once | ORAL | Status: AC | PRN
  Administered 2023-10-02: 25 mg via ORAL
  Filled 2023-10-02: qty 1

## 2023-10-02 MED ORDER — JUVEN PO PACK: 1.0000 | PACK | Freq: Two times a day (BID) | ORAL | 0 refills | Status: DC

## 2023-10-02 MED ORDER — LOSARTAN POTASSIUM 50 MG PO TABS: 50.0000 mg | ORAL_TABLET | Freq: Every day | ORAL | 0 refills | Status: DC

## 2023-10-02 MED ORDER — METHOCARBAMOL 500 MG PO TABS: 500.0000 mg | ORAL_TABLET | Freq: Three times a day (TID) | ORAL | 0 refills | Status: AC | PRN

## 2023-10-02 MED ORDER — ASCORBIC ACID 500 MG PO TABS: 500.0000 mg | ORAL_TABLET | Freq: Two times a day (BID) | ORAL | 0 refills | Status: DC

## 2023-10-02 MED ORDER — CLONIDINE HCL 0.1 MG PO TABS: 0.1000 mg | ORAL_TABLET | Freq: Every day | ORAL | 0 refills | Status: DC

## 2023-10-02 MED ORDER — ENSURE ENLIVE PO LIQD: 237.0000 mL | Freq: Two times a day (BID) | ORAL | 12 refills | Status: DC

## 2023-10-02 MED ORDER — LANTUS SOLOSTAR 100 UNIT/ML ~~LOC~~ SOPN: 5.0000 [IU] | PEN_INJECTOR | Freq: Every day | SUBCUTANEOUS | 0 refills | Status: AC

## 2023-10-02 MED ORDER — FOOD THICKENER (SIMPLYTHICK): 1.0000 | ORAL | 0 refills | Status: DC | PRN

## 2023-10-02 MED ORDER — VITAMIN A 3 MG (10000 UNIT) PO CAPS: 50000.0000 [IU] | ORAL_CAPSULE | Freq: Every day | ORAL | 0 refills | Status: AC

## 2023-10-02 MED ORDER — AMOXICILLIN-POT CLAVULANATE 875-125 MG PO TABS: 1.0000 | ORAL_TABLET | Freq: Two times a day (BID) | ORAL | 0 refills | Status: AC

## 2023-10-02 NOTE — Progress Notes (Signed)
[facility] Progress Note   Subjective:   Seen on HD, tolerating treatment well. Denies SOB, CP, dizziness.   Objective Vitals:   10/01/23 1656 10/01/23 2100 10/02/23 0518 10/02/23 0540  BP: 119/71 122/73 124/80 124/80  Pulse: 73 70 73   Resp: 18 18    Temp:  98.5 F (36.9 C) 98.2 F (36.8 C)   TempSrc:  Oral    SpO2: 98%  95%   Weight:      Height:       Physical Exam General: Alert male in NAD Heart: RRR, no murmurs, rubs or gallosp Lungs: CTA bilaterally, respirations unlabored Abdomen: Soft, non-distended, +BS Extremities: No edema b/l lower extremities Dialysis Access: [facility] accessed  Additional Objective Labs: Basic Metabolic Panel: Recent Labs  Lab 09/27/23 0440 09/28/23 0535 09/30/23 0553  NA 130* 131* 131*  K 4.4 4.5 4.4  CL 100 102 101  CO2 24 23 24   GLUCOSE 112* 102* 133*  BUN 28* 31* 35*  CREATININE 2.42* 2.80* 2.81*  CALCIUM 8.0* 8.2* 8.1*  PHOS  --  3.7  --    Liver Function Tests: Recent Labs  Lab 09/28/23 0535  ALBUMIN 1.5*   No results for input(s): "LIPASE", "AMYLASE" in the last 168 hours. CBC: Recent Labs  Lab 09/27/23 0440 09/28/23 0535 09/30/23 0553  WBC 10.5 9.3 14.1*  HGB 7.8* 7.6* 8.0*  HCT 23.9* 22.5* 23.6*  MCV 96.4 94.9 95.9  PLT 227 253 297   Blood Culture    Component Value Date/Time   SDES BLOOD RIGHT ARM 09/17/2023 2024   SPECREQUEST  09/17/2023 2024    BOTTLES DRAWN AEROBIC AND ANAEROBIC Blood Culture adequate volume   CULT  09/17/2023 2024    NO GROWTH 5 DAYS Performed at [facility], [address] [place], [place] [address]    REPTSTATUS 09/22/2023 FINAL 09/17/2023 2024    Cardiac Enzymes: No results for input(s): "CKTOTAL", "CKMB", "CKMBINDEX", "TROPONINI" in the last 168 hours. CBG: Recent Labs  Lab 10/01/23 0740 10/01/23 1206 10/01/23 1613 10/01/23 2140 10/02/23 0728  GLUCAP 120* 121* 115* 175* 104*   Iron Studies: No results for input(s): "IRON", "TIBC",  "TRANSFERRIN", "FERRITIN" in the last 72 hours. @lablastinr3 @ Studies/Results: No results found. Medications:  piperacillin-tazobactam (ZOSYN)  IV 2.25 g (10/02/23 0542)    amiodarone  100 mg Oral Daily   amLODipine  10 mg Oral Daily   ascorbic acid  500 mg Oral BID   atorvastatin  40 mg Oral QHS   carvedilol  25 mg Oral BID WC   Chlorhexidine Gluconate Cloth  6 each Topical Q0600   cloNIDine  0.1 mg Oral BID   Followed by   Melene Muller ON 10/03/2023] cloNIDine  0.1 mg Oral Daily   darbepoetin (ARANESP) injection - DIALYSIS  100 mcg Subcutaneous Q Sat-1800   diphenhydrAMINE  25 mg Oral Once   feeding supplement  237 mL Oral BID BM   finasteride  5 mg Oral Daily   hydrALAZINE  25 mg Oral Q8H   influenza vac split trivalent PF  0.5 mL Intramuscular Tomorrow-1000   insulin aspart  0-5 Units Subcutaneous QHS   insulin aspart  0-6 Units Subcutaneous TID WC   insulin glargine-yfgn  5 Units Subcutaneous Daily   leptospermum manuka honey  1 Application Topical Daily   levETIRAcetam  1,500 mg Oral BID   losartan  50 mg Oral Daily   multivitamin  1 tablet Oral QHS   nutrition supplement (JUVEN)  1 packet  Oral BID BM   pantoprazole  40 mg Oral BID   PARoxetine  10 mg Oral Daily   sodium chloride flush  3 mL Intravenous Q12H   terazosin  1 mg Oral QHS   vitamin A  50,000 Units Oral Daily   Vitamin D (Ergocalciferol)  50,000 Units Oral Q7 days    Dialysis Orders: [facility]  4hrs 500/AF 1.5,  EDW 98.6kg, 3.0 K/ 2.5Ca TDC  -No Heparin  - Mircera 50 mcg q 2 weeks - Not started yet - Hectorol IV TIW   Assessment/Plan: 1.Vomiting - Daily for months, now resolved per patient, G tube malfunction.  Noted IR 11/11 consult "both lumens flush now easily."  Management per admit team tolerating bolus feedings currently.  ALB less than 1.5 dietary consulted getting supplements 2.Hyponatremia-Na in 130's. Volume management with HD.  Suspect this is a chronic issue. Follow labs.  3. ESRD -  On HD TTS.  Started during prolonged hospitalization but previously followed for CKD stage 5 planning for HD.  Suspect muscle wasting causing falsely low SCr. Truncated HD 09/28/2023.  Next HD 10/02/2023. Due to the holiday, his HD schedule will be Monday, Wednesday, Saturday next week.  4. Leukocytosis -  UTI suspected, ABX started.  Blood cultures obtained.  Has sacral decub status post 11/11 surgical bedside debridement, wound care following. Improving.  5. Peg tube malfunction - IR  consulted.  As above.  And appears functional now  6.  Hypertension/volume  - BP well controlled. He did have pleural effusions noted on CT. Variable weights here. Continue UF with HD as tolerated.  7.  Anemia of CKD -Hgb 8.0 s/p 1 unit PRBC. Aranesp 60 mcg weekly.Increase dose 09/28/2023 to 100 mcg SQ. Follow HGB.  8.  Secondary Hyperparathyroidism -  CCa  10.2 , Phos dropped to 1's but improved with supplementation. Recheck with labs with HD today.  9.  Nutrition - Phos running low. Does not need a renal diet, will CTM potassium 10. Hx hemorrhagic stroke -left-sided hemiparesis -Rx Per admit team/noted previous rehab center admit and was at home prior to this admission 11. [facility] consult for permanent access. Patient's wife is interested in home HD. Dr. Valentino Nose is aware. Unfortunately Dr. Lenell Antu does not find him stable enough at present to proceed with permanent access at this time. Will return to [facility] as OP.     Rogers Blocker, PA-C 10/02/2023, 8:46 AM   [facility] Pager: [phone]

## 2023-10-02 NOTE — TOC Transition Note (Addendum)
Transition of Care [facility]) - CM/SW Discharge Note   Patient Details  Name: Bruce Little MRN: [id number] Date of Birth: 03-Jan-1971  [facility] CM/SW Contact:  Ronny Bacon, RN Phone Number: 10/02/2023, 1:14 PM   Clinical Narrative:   Patient is being discharged today. Message to [facility] with [facility] to give update.  1530: Secure chat from nurse requesting phone call about patient. Spoke with floor nurse, reports she changed patient to preevena [facility]AC and had questions about patient [facility] services. Upon review of chart, noted that patient was supposed to get [facility]. Attempt made to reach out to [place] with [employer] to get status on patient wound vac for home, no response. Another case manager went to patient room to assess wound vacs at bedside. Reports patient does not have [employer] for home vac in room at this time and patient will not get discharged until the right equipment is delivered. Spoke with Nurse also reports that Family concerned about patient wound care, foley care and Peg Tube care once he is home. Per Rivka Barbara with [facility], will need specific orders for Wound care, Foley Care and Peg tube care, along with [facility] RN and PT orders. Providers made aware. Glenda updated on discharge home being on hold at this time.     Final next level of care: Home w Home Health Services Barriers to Discharge: No Barriers Identified   Patient Goals and CMS Choice      Discharge Placement                         Discharge Plan and Services Additional resources added to the After Visit Summary for                                       Social Determinants of Health (SDOH) Interventions SDOH Screenings   Food Insecurity: No Food Insecurity (09/17/2023)  Housing: Low Risk  (09/17/2023)  Transportation Needs: No Transportation Needs (09/17/2023)  Utilities: Not At Risk (09/17/2023)  Depression (PHQ2-9): Low Risk  (08/27/2021)  Financial Resource Strain: Low Risk  (08/10/2023)    Received from [facility]  Social Connections: Unknown (08/13/2023)   Received from [facility]  Stress: No Stress Concern Present (08/10/2023)   Received from [facility]  Tobacco Use: Low Risk  (09/20/2023)     Readmission Risk Interventions    09/22/2023   11:18 AM 07/11/2021    3:04 PM  Readmission Risk Prevention Plan  Transportation Screening Complete Complete  PCP or Specialist Appt within 5-7 Days Complete Complete  Home Care Screening Complete Complete  Medication Review (RN CM) Referral to Pharmacy Complete

## 2023-10-02 NOTE — Plan of Care (Signed)
Care Plan  ?

## 2023-10-02 NOTE — Progress Notes (Signed)
   10/02/23 1230  Vitals  Temp 98.2 F (36.8 C)  Pulse Rate 87  Resp 18  BP 133/86  SpO2 97 %  Post Treatment  Dialyzer Clearance Lightly streaked  Hemodialysis Intake (mL) 0 mL  Liters Processed 64.2  Fluid Removed (mL) 2000 mL  Tolerated HD Treatment Yes   Received patient in bed to unit.  Alert and oriented.  Informed consent signed and in chart.   TX duration:3hrs  Patient tolerated well.  Transported back to the room  Alert, without acute distress.  Hand-off given to patient's nurse.   Access used: [facility] Access issues: none  Total UF removed: 2L Medication(s) given: none    Na'Shaminy T [facility]

## 2023-10-02 NOTE — Progress Notes (Signed)
Wound vac dressing taken down due to blockage. Wet to dry dressing applied.

## 2023-10-02 NOTE — Plan of Care (Signed)

## 2023-10-02 NOTE — Discharge Summary (Signed)
Physician Discharge Summary  Bruce Little HQI:[id number] DOB: 1971/10/04 DOA: 09/17/2023  PCP: Westley Hummer, MD  Admit date: 09/17/2023 Discharge date: 10/02/2023  Admitted From: Home Disposition:  Home  Recommendations for Outpatient Follow-up:  Follow up with PCP in 1-2 weeks Follow up with nephrology/HD as scheduled Follow up with surgery/wound care in regards to wound vac  Discharge Condition:Stable  CODE STATUS:Full  Diet recommendation: As tolerated   Brief/Interim Summary: This is a 52 year old male with DM, PAF on Eliquis in the past, CKD now on dialysis, left BKA who comes into the hospital with intermittent nausea and vomiting, fever, and clogged PEG tube.   He has had a prolonged hospitalization at [facility] in [place] in August, admitted there 04/30/2023 and discharged 07/01/2023.  Hospital course reviewed.  He was visiting [place] from [place], was in a hotel when he was found to have altered mental status, vomiting.  He was found to be hypertensive in the ER with a blood pressure of 226/100, and a CT of the head showed 9.2 x 5.5 cm right frontal temporal parenchymal bleed with edema, mass effect and 1.1 cm left midline shift.  He is status post craniectomy and hematoma evacuation and EVD placement.  Hospital course complicated by Staph epidermidis in the CSF 7/17, will repeat growth 7/22 and 7/24.  Eventually EVD was removed, and there were no plans to replace the bone flap and will need artificial plate eventually.  He will developed sacral decubitus ulcer and underwent serial debridements, sacral wounds grew E. coli, Morganella and Enterococcus faecalis and placed of antibiotics for several weeks.  Hospital course was also complicated by C. difficile diarrhea status post full course of vancomycin while being on IV antibiotics also, and in addition, had a PEG and a trach and developed renal failure requiring dialysis.   Following [place]  hospitalization, he was discharged to select LT[facility] and he was there August 22 through August 10, 2023.  Worth mentioning is that he was decannulated on 07/23/2023, and weaned off to room air, and also with ongoing SLP he was started on p.o. intake along with his PEG tube.  He has been in rehab through October, but apparently has been home for couple of weeks prior to being here.  Patient admitted as above with sepsis secondary to presumed UTI as well as sacral decubitus ulcer with wound VAC dysfunction.  Patient initially treated for UTI, cultures resulted with Pseudomonas.  Patient required debridement at bedside with general surgery on 11/11, initial wound VAC issues in the setting of profound diarrhea likely caused by tube feeds.  Patient's tube feeds were weaned off, his diarrhea improved, and wound VAC was initially placed with no further issues or events.  He is otherwise stable for discharge home.  Prior to discharge patient had notable cloudy urine, mild leukocytosis but afebrile, given recent infection and Foley catheter placement discussed case with ID who recommended additional week of antibiotics to cover for potentially undertreated UTI.  Patient otherwise stable and agreeable for discharge home, tolerating p.o. well, pain well-controlled, wound VAC intact with ongoing hemodialysis per nephrology.  Multiple medication changes as below.  Discharge Diagnoses:  Principal Problem:   Vomiting Active Problems:   Renal insufficiency   HTN (hypertension)   UTI (urinary tract infection)   Leukocytosis   Sacral decubitus ulcer   ICH (intracerebral hemorrhage) (HCC)   Malnutrition of moderate degree    Discharge Instructions  Discharge Instructions     Ambulatory referral to  Physical Medicine Rehab   Complete by: As directed    Discharge patient   Complete by: As directed    Discharge disposition: 01-Home or Self Care   Discharge patient date: 10/02/2023      Allergies as of 10/02/2023    No Known Allergies      Medication List     TAKE these medications    albuterol 108 (90 Base) MCG/ACT inhaler Commonly known as: VENTOLIN HFA Inhale 2 puffs into the lungs every 4 (four) hours as needed.   amiodarone 100 MG tablet Commonly known as: PACERONE Take 100 mg by mouth daily.   amLODipine 10 MG tablet Commonly known as: NORVASC Take 10 mg by mouth daily.   amoxicillin-clavulanate 875-125 MG tablet Commonly known as: AUGMENTIN Take 1 tablet by mouth 2 (two) times daily for 6 days.   ascorbic acid 500 MG tablet Commonly known as: VITAMIN C Take 1 tablet (500 mg total) by mouth 2 (two) times daily.   atorvastatin 40 MG tablet Commonly known as: LIPITOR Take 40 mg by mouth at bedtime.   carvedilol 25 MG tablet Commonly known as: COREG Take 1 tablet (25 mg total) by mouth 2 (two) times daily with a meal. What changed:  medication strength how much to take   cloNIDine 0.1 MG tablet Commonly known as: CATAPRES Take 1 tablet (0.1 mg total) by mouth daily. What changed:  how much to take when to take this   feeding supplement Liqd Take 237 mLs by mouth 2 (two) times daily between meals.   nutrition supplement (JUVEN) Pack Take 1 packet by mouth 2 (two) times daily between meals.   finasteride 5 MG tablet Commonly known as: PROSCAR Take 5 mg by mouth daily.   food thickener Gel Commonly known as: SIMPLYTHICK (NECTAR/LEVEL 2/MILDLY THICK) Take 1 packet by mouth as needed.   furosemide 40 MG tablet Commonly known as: LASIX Take 40 mg by mouth daily.   glipiZIDE 5 MG tablet Commonly known as: Glucotrol Take 1 tablet (5 mg total) by mouth daily.   hydrALAZINE 25 MG tablet Commonly known as: APRESOLINE Take 25 mg by mouth every 8 (eight) hours.   Lantus SoloStar 100 UNIT/ML Solostar Pen Generic drug: insulin glargine Inject 5 Units into the skin daily. What changed: when to take this   leptospermum manuka honey Pste paste Apply 1  Application topically daily.   levETIRAcetam 750 MG tablet Commonly known as: KEPPRA Take 1,500 mg by mouth 2 (two) times daily.   losartan 50 MG tablet Commonly known as: COZAAR Take 1 tablet (50 mg total) by mouth daily. What changed:  medication strength how much to take   Melatonin 10 MG Tabs Take 1 tablet by mouth at bedtime.   methocarbamol 500 MG tablet Commonly known as: ROBAXIN Take 1 tablet (500 mg total) by mouth every 8 (eight) hours as needed for muscle spasms.   multivitamin Tabs tablet Take 1 tablet by mouth at bedtime.   ondansetron 4 MG tablet Commonly known as: Zofran Take 1 tablet (4 mg total) by mouth every 8 (eight) hours as needed for nausea or vomiting.   pantoprazole 40 MG tablet Commonly known as: PROTONIX Take 1 tablet (40 mg total) by mouth daily. What changed: when to take this   PARoxetine 20 MG tablet Commonly known as: PAXIL Take 10 mg by mouth daily.   terazosin 1 MG capsule Commonly known as: HYTRIN Take 1 mg by mouth at bedtime.   vitamin A 3 MG (  10000 UNITS) capsule Take 5 capsules (50,000 Units total) by mouth daily for 5 days.   Vitamin D (Ergocalciferol) 1.25 MG (50000 UNIT) Caps capsule Commonly known as: DRISDOL Take 1 capsule (50,000 Units total) by mouth every 7 (seven) days. Start taking on: October 03, 2023        Follow-up Information     Jobe Gibbon, [facility] Follow up.   Specialty: Home Health Services Why: Someone will call you to schedule resumption of care visit. Contact information: [address] [place] [address] [phone]                No Known Allergies  Consultations: Nephrology, general surgery, wound care, vascular surgery  Procedures/Studies: VAS [place] UPPER EXT VEIN MAPPING (PRE-OP AVF)  Result Date: 09/29/2023 UPPER EXTREMITY VEIN MAPPING Patient Name:  ELGIN AGUAYO  Date of Exam:   09/29/2023 Medical Rec #: [id number]     Accession #:    [id number]  Date of Birth: July 26, 1971     Patient Gender: M Patient Age:   52 years Exam Location:  [facility] Procedure:      VAS [place] UPPER EXT VEIN MAPPING (PRE-OP AVF) Referring Phys: [facility] --------------------------------------------------------------------------------  Indications: History of PAD; patient is pre-operative for bypass. Comparison Study: No prior study Performing Technologist: Shona Simpson  Examination Guidelines: A complete evaluation includes B-mode imaging, spectral Doppler, color Doppler, and power Doppler as needed of all accessible portions of each vessel. Bilateral testing is considered an integral part of a complete examination. Limited examinations for reoccurring indications may be performed as noted. +-----------------+-------------+----------+---------+ Right Cephalic   Diameter (cm)Depth (cm)Findings  +-----------------+-------------+----------+---------+ Shoulder             0.46        1.22             +-----------------+-------------+----------+---------+ Prox upper arm       0.36        0.59             +-----------------+-------------+----------+---------+ Mid upper arm        0.37        0.36             +-----------------+-------------+----------+---------+ Dist upper arm       0.40        0.42   branching +-----------------+-------------+----------+---------+ Antecubital fossa    0.63        0.15   branching +-----------------+-------------+----------+---------+ Prox forearm         0.45        0.46             +-----------------+-------------+----------+---------+ Mid forearm          0.41        0.41             +-----------------+-------------+----------+---------+ Dist forearm         0.34        0.22             +-----------------+-------------+----------+---------+ Wrist                0.37        0.22   branching +-----------------+-------------+----------+---------+  +-----------------+-------------+----------+--------------+ Right Basilic    Diameter (cm)Depth (cm)   Findings    +-----------------+-------------+----------+--------------+ Shoulder  not visualized +-----------------+-------------+----------+--------------+ Prox upper arm                          not visualized +-----------------+-------------+----------+--------------+ Mid upper arm                           not visualized +-----------------+-------------+----------+--------------+ Dist upper arm       0.80        1.33     branching    +-----------------+-------------+----------+--------------+ Antecubital fossa    0.78        1.50     branching    +-----------------+-------------+----------+--------------+ Prox forearm         0.42        0.18     branching    +-----------------+-------------+----------+--------------+ Mid forearm          0.41        0.18                  +-----------------+-------------+----------+--------------+ Distal forearm       0.34        0.22                  +-----------------+-------------+----------+--------------+ Wrist                0.30        0.21                  +-----------------+-------------+----------+--------------+ +-----------------+-------------+----------+---------+ Left Cephalic    Diameter (cm)Depth (cm)Findings  +-----------------+-------------+----------+---------+ Shoulder             0.30        1.68             +-----------------+-------------+----------+---------+ Prox upper arm       0.30        0.90             +-----------------+-------------+----------+---------+ Mid upper arm        0.30        0.43             +-----------------+-------------+----------+---------+ Dist upper arm       0.29        0.52   branching +-----------------+-------------+----------+---------+ Antecubital fossa    0.47        0.53   branching  +-----------------+-------------+----------+---------+ Prox forearm         0.19        0.71   branching +-----------------+-------------+----------+---------+ Mid forearm          0.24        0.41             +-----------------+-------------+----------+---------+ Dist forearm         0.15        0.21   branching +-----------------+-------------+----------+---------+ Wrist                0.15        0.23             +-----------------+-------------+----------+---------+ +-----------------+-------------+----------+--------------+ Left Basilic     Diameter (cm)Depth (cm)   Findings    +-----------------+-------------+----------+--------------+ Shoulder                                not visualized +-----------------+-------------+----------+--------------+ Prox upper arm       0.51        1.47  branching    +-----------------+-------------+----------+--------------+ Mid upper arm        0.39        1.14                  +-----------------+-------------+----------+--------------+ Dist upper arm       0.46        0.79                  +-----------------+-------------+----------+--------------+ Antecubital fossa    0.70        0.39     branching    +-----------------+-------------+----------+--------------+ Prox forearm         0.33        0.32                  +-----------------+-------------+----------+--------------+ Mid forearm          0.30        0.35                  +-----------------+-------------+----------+--------------+ Distal forearm       0.26        0.25                  +-----------------+-------------+----------+--------------+ Wrist                0.30        0.23     branching    +-----------------+-------------+----------+--------------+ *See table(s) above for measurements and observations.  Diagnosing physician: Heath Lark Electronically signed by Heath Lark on 09/29/2023 at 6:12:04 PM.    Final    CT HEAD WO CONTRAST  ( )  Result Date: 09/28/2023 CLINICAL DATA:  Stroke follow-up EXAM: CT HEAD WITHOUT CONTRAST TECHNIQUE: Contiguous axial images were obtained from the base of the skull through the vertex without intravenous contrast. RADIATION DOSE REDUCTION: This exam was performed according to the departmental dose-optimization program which includes automated exposure control, adjustment of the mA and/or kV according to patient size and/or use of iterative reconstruction technique. COMPARISON:  Nine days prior FINDINGS: Brain: Extensive encephalomalacia involving the right cerebral hemisphere with broad craniectomy. Although bulging appearance of the brain in some areas towards the vertex suspicious for vasogenic edema, there is overall volume loss and white matter low-density is primarily attributed to encephalomalacia. No evidence of acute infarct, acute hemorrhage, hydrocephalus, mass, or collection. There is cerebral volume loss and ventriculomegaly. Vascular: No hyperdense vessel or unexpected calcification. Skull: Broad craniectomy on the right. Sinuses/Orbits: Negative IMPRESSION: 1. No acute or interval finding. 2. Remote right cerebral insult with bulging at a broad right craniectomy. Electronically Signed   By: Tiburcio Pea M.D.   On: 09/28/2023 05:44   EEG adult  Result Date: 09/27/2023 Charlsie Quest, MD     09/27/2023  7:45 PM Patient Name: Bruce Little MRN: [id number] Epilepsy Attending: Charlsie Quest Referring Physician/Provider: Leatha Gilding, MD Date: 09/27/2023 Duration: 26.25 mins Patient history: 52 yo M with h/o seizure getting eeg to evaluate for seizure Level of alertness: Awake, asleep AEDs during EEG study: LEV Technical aspects: This EEG study was done with scalp electrodes positioned according to the 10-20 International system of electrode placement. Electrical activity was reviewed with band pass filter of 1-70Hz , sensitivity of 7 uV/mm, display speed of 77mm/sec with a 60Hz   notched filter applied as appropriate. EEG data were recorded continuously and digitally stored.  Video monitoring was available and reviewed as appropriate. Description: The posterior dominant rhythm consists of 8-9 Hz activity  of moderate voltage (25-35 uV) seen predominantly in posterior head regions, symmetric and reactive to eye opening and eye closing. Sleep was characterized by vertex waves, sleep spindles (12 to 14 Hz), maximal frontocentral region. EEG showed continuous polymorphic sharply contoured 3 to 6 Hz theta-delta slowing in right hemisphere. There was also overriding 12-14Hz  beta activity in right centro-parietal region consistent with breach artifact. Hyperventilation and photic stimulation were not performed.   ABNORMALITY - Breach artifact, right centro-parietal region - Continuous slow, right hemisphere IMPRESSION: This study is suggestive of cortical dysfunction arising from right centro-parietal region  consistent with underlying craniotomy. Additionally there is cortical dysfunction in right hemisphere likely secondary to underlying stroke. No seizures were seen throughout the recording. Priyanka Annabelle Harman   DG ABDOMEN PEG TUBE LOCATION  Result Date: 09/20/2023 CLINICAL DATA:  [id number] PEG (percutaneous endoscopic gastrostomy) adjustment/replacement/removal [facility]) [id number] EXAM: ABDOMEN - 1 VIEW COMPARISON:  CT abdomen pelvis 09/17/23, x-ray abdomen 08/02/2023 FINDINGS: Gastrostomy tube overlies the gastric lumen with PO contrast partially opacifying the gastric lumen. The bowel gas pattern is normal. No radio-opaque calculi or other significant radiographic abnormality are seen. IMPRESSION: Gastrostomy tube in appropriate position. Electronically Signed   By: Tish Frederickson M.D.   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 Current gastrostomy has been clogged for several weeks per patient's wife. Request for removal of existing pull through gastrostomy and placement of new balloon retention gastrostomy for ongoing nutritional needs. EXAM: BEDSIDE REMOVAL AND PLACEMENT OF NEW GASTROSTOMY TUBE WITHIN EXISTING TRACT COMPARISON:  None Available. MEDICATIONS: None. CONTRAST:  30 mL gastrografin - administered into the gastric lumen FLUOROSCOPY TIME:  None COMPLICATIONS: None immediate. PROCEDURE: Informed written consent was obtained from the patient's wife Rohail Schmoldt after a discussion of the risks, benefits and alternatives to treatment. Questions regarding the procedure were encouraged and answered. A timeout was performed prior to the initiation of the procedure. The upper abdomen and external portion of the existing gastrostomy tube was prepped and draped in the usual sterile fashion, and a sterile drape was applied covering the operative field. Maximum barrier sterile technique with sterile gowns and gloves were used for the procedure. A timeout was performed prior to the initiation of the procedure. The existing gastrostomy tube was removed in tact with gentle traction. Next, a 20 Fr balloon retention Entuit gastrostomy tube was lubricated with viscous lidocaine and placed within the existing tract. Immediate return of blood was noted within the lumen. The balloon was inflated with 20 mL of normal saline, pulled against the anterior inner lumen of the stomach and the external disc cinched to the skin. Both lumens flushed easily with 10 mL normal saline. KUB with gastrografin injection obtained post procedure which shows the gastrostomy tube to be within the gastric lumen. IMPRESSION: Successful removal of existing gastrostomy tube and placement of a new 20-French gastrostomy tube. The gastrostomy tube is ready for immediate use. Performed by Lynnette Caffey, PA-C  Electronically Signed   By: Marliss Coots M.D.   On: 09/20/2023 14:45   CT HEAD WO CONTRAST ( )  Result Date: 09/18/2023 CLINICAL DATA:  Stroke in June. Left-sided paralysis. Sent to ED by primary care. EXAM: CT HEAD WITHOUT CONTRAST TECHNIQUE: Contiguous axial images were obtained from the base of the skull through the vertex without intravenous contrast. RADIATION DOSE  REDUCTION: This exam was performed according to the departmental dose-optimization program which includes automated exposure control, adjustment of the mA and/or kV according to patient size and/or use of iterative reconstruction technique. COMPARISON:  MRI head 05/19/2021. FINDINGS: Brain: No intracranial hemorrhage, mass effect, or evidence of acute infarct. Extensive encephalomalacia in the right cerebral hemisphere compatible with chronic infarct. Ex vacuo dilatation ventricles. Basal cisterns are patent. No extra-axial fluid collection. Age-commensurate cerebral atrophy and chronic small vessel ischemic disease. Vascular: No hyperdense vessel. Intracranial arterial calcification. Skull: No fracture or focal lesion.  Right craniectomy. Sinuses/Orbits: Opacification of the few mastoid air cells bilaterally. Mucosal thickening left maxillary sinus. Other: None. IMPRESSION: 1. No acute intracranial abnormality. 2. Extensive chronic infarct in the right cerebral hemisphere. Electronically Signed   By: Minerva Fester M.D.   On: 09/18/2023 03:19   CT ABDOMEN PELVIS W CONTRAST  Result Date: 09/17/2023 CLINICAL DATA:  Nausea, vomiting, possible UTI.  GI tube clogged EXAM: CT ABDOMEN AND PELVIS WITH CONTRAST TECHNIQUE: Multidetector CT imaging of the abdomen and pelvis was performed using the standard protocol following bolus administration of intravenous contrast. RADIATION DOSE REDUCTION: This exam was performed according to the departmental dose-optimization program which includes automated exposure control, adjustment of the mA and/or kV  according to patient size and/or use of iterative reconstruction technique. CONTRAST:  75mL OMNIPAQUE IOHEXOL 350 MG/ML SOLN COMPARISON:  Renal ultrasound 08/04/2023 and radiographs 08/02/2023 FINDINGS: Lower chest: Small left-greater-than-right pleural effusions and associated atelectasis. Hepatobiliary: Mild distention of the gallbladder. Hyperdense layering sludge in the gallbladder. No biliary dilation. Liver is unremarkable. Pancreas: Unremarkable. Spleen: Unremarkable. Adrenals/Urinary Tract: Unremarkable adrenal glands. No urinary calculi or hydronephrosis. Foley catheter in the nondistended bladder. Stomach/Bowel: Normal caliber large and small bowel. Moderate colonic stool load. Normal appendix. Stomach is within normal limits. Expected location of the percutaneous gastrostomy tube in the stomach. Vascular/Lymphatic: Aortic atherosclerosis. No enlarged abdominal or pelvic lymph nodes. Reproductive: No acute abnormality. Other: Smaller free fluid in the pelvis. No free intraperitoneal air. Musculoskeletal: Resorption of the inferior sacrum and coccyx compatible with osteomyelitis. There is a large sacral decubitus ulcer measuring 7.4 x 6.6 x 3.9 cm and abutting the inferior portion of the remaining sacrum. Adjacent fat stranding and subcutaneous and presacral edema. No abscess. No soft tissue gas. IMPRESSION: 1. Large sacral decubitus ulcer with resorption of the inferior sacrum and coccyx. No abscess. 2. Small left-greater-than-right pleural effusions and associated atelectasis. 3. Expected location of the percutaneous gastrostomy tube in the stomach. 4. Foley catheter in the nondistended bladder. Aortic Atherosclerosis (ICD10-I70.0). Electronically Signed   By: Minerva Fester M.D.   On: 09/17/2023 20:35     Subjective: No acute issues or events overnight, tolerating dialysis well   Discharge Exam: Vitals:   10/02/23 1230 10/02/23 1253  BP: 133/86 (!) 164/103  Pulse: 87 (!) 105  Resp: 18 20   Temp: 98.2 F (36.8 C) 98 F (36.7 C)  SpO2: 97% 99%   Vitals:   10/02/23 1215 10/02/23 1225 10/02/23 1230 10/02/23 1253  BP: 107/71  133/86 (!) 164/103  Pulse: 94 94 87 (!) 105  Resp: 19 17 18 20   Temp:   98.2 F (36.8 C) 98 F (36.7 C)  TempSrc:      SpO2: 98% 97% 97% 99%  Weight:      Height:        General: Pt is alert, awake, not in acute distress Cardiovascular: RRR, S1/S2 +, no rubs, no gallops Respiratory: CTA bilaterally, no wheezing, no  rhonchi Abdominal: Soft, NT, ND, bowel sounds + Extremities: no edema, no cyanosis   The results of significant diagnostics from this hospitalization (including imaging, microbiology, ancillary and laboratory) are listed below for reference.     Microbiology: No results found for this or any previous visit (from the past 240 hour(s)).   Labs: BNP (last 3 results) No results for input(s): "BNP" in the last 8760 hours. Basic Metabolic Panel: Recent Labs  Lab 09/27/23 0440 09/28/23 0535 09/30/23 0553  NA 130* 131* 131*  K 4.4 4.5 4.4  CL 100 102 101  CO2 24 23 24   GLUCOSE 112* 102* 133*  BUN 28* 31* 35*  CREATININE 2.42* 2.80* 2.81*  CALCIUM 8.0* 8.2* 8.1*  MG  --  1.7  --   PHOS  --  3.7  --    Liver Function Tests: Recent Labs  Lab 09/28/23 0535  ALBUMIN 1.5*   No results for input(s): "LIPASE", "AMYLASE" in the last 168 hours. No results for input(s): "AMMONIA" in the last 168 hours. CBC: Recent Labs  Lab 09/27/23 0440 09/28/23 0535 09/30/23 0553  WBC 10.5 9.3 14.1*  HGB 7.8* 7.6* 8.0*  HCT 23.9* 22.5* 23.6*  MCV 96.4 94.9 95.9  PLT 227 253 297   Cardiac Enzymes: No results for input(s): "CKTOTAL", "CKMB", "CKMBINDEX", "TROPONINI" in the last 168 hours. BNP: Invalid input(s): "POCBNP" CBG: Recent Labs  Lab 10/01/23 1206 10/01/23 1613 10/01/23 2140 10/02/23 0728 10/02/23 1253  GLUCAP 121* 115* 175* 104* 123*   D-Dimer No results for input(s): "DDIMER" in the last 72 hours. Hgb A1c No  results for input(s): "HGBA1C" in the last 72 hours. Lipid Profile No results for input(s): "CHOL", "HDL", "LDLCALC", "TRIG", "CHOLHDL", "LDLDIRECT" in the last 72 hours. Thyroid function studies No results for input(s): "TSH", "T4TOTAL", "T3FREE", "THYROIDAB" in the last 72 hours.  Invalid input(s): "FREET3" Anemia work up No results for input(s): "VITAMINB12", "FOLATE", "FERRITIN", "TIBC", "IRON", "RETICCTPCT" in the last 72 hours. Urinalysis    Component Value Date/Time   COLORURINE YELLOW 09/17/2023 1004   APPEARANCEUR CLOUDY (A) 09/17/2023 1004   LABSPEC 1.025 09/17/2023 1004   PHURINE 8.0 09/17/2023 1004   GLUCOSEU 50 (A) 09/17/2023 1004   HGBUR MODERATE (A) 09/17/2023 1004   BILIRUBINUR NEGATIVE 09/17/2023 1004   KETONESUR NEGATIVE 09/17/2023 1004   PROTEINUR 100 (A) 09/17/2023 1004   NITRITE NEGATIVE 09/17/2023 1004   LEUKOCYTESUR LARGE (A) 09/17/2023 1004   Sepsis Labs Recent Labs  Lab 09/27/23 0440 09/28/23 0535 09/30/23 0553  WBC 10.5 9.3 14.1*   Microbiology No results found for this or any previous visit (from the past 240 hour(s)).   Time coordinating discharge: Over 30 minutes  SIGNED:   [name], DO [facility] 10/02/2023, 2:17 PM Pager   If 7PM-7AM, please contact night-coverage [email or web address]

## 2023-10-03 DIAGNOSIS — N289 Disorder of kidney and ureter, unspecified: Secondary | ICD-10-CM | POA: Diagnosis not present

## 2023-10-03 DIAGNOSIS — N3000 Acute cystitis without hematuria: Secondary | ICD-10-CM | POA: Diagnosis not present

## 2023-10-03 DIAGNOSIS — R1110 Vomiting, unspecified: Secondary | ICD-10-CM | POA: Diagnosis not present

## 2023-10-03 DIAGNOSIS — E440 Moderate protein-calorie malnutrition: Secondary | ICD-10-CM | POA: Diagnosis not present

## 2023-10-03 LAB — CBC
HCT: 27.8 % — ABNORMAL LOW (ref 39.0–52.0)
Hemoglobin: 8.9 g/dL — ABNORMAL LOW (ref 13.0–17.0)
MCH: 31.2 pg (ref 26.0–34.0)
MCHC: 32 g/dL (ref 30.0–36.0)
MCV: 97.5 fL (ref 80.0–100.0)
Platelets: 364 10*3/uL (ref 150–400)
RBC: 2.85 MIL/uL — ABNORMAL LOW (ref 4.22–5.81)
RDW: 16.2 % — ABNORMAL HIGH (ref 11.5–15.5)
WBC: 12.1 10*3/uL — ABNORMAL HIGH (ref 4.0–10.5)
nRBC: 0 % (ref 0.0–0.2)

## 2023-10-03 LAB — GLUCOSE, CAPILLARY
Glucose-Capillary: 114 mg/dL — ABNORMAL HIGH (ref 70–99)
Glucose-Capillary: 113 mg/dL — ABNORMAL HIGH (ref 70–99)
Glucose-Capillary: 98 mg/dL (ref 70–99)
Glucose-Capillary: 191 mg/dL — ABNORMAL HIGH (ref 70–99)
Glucose-Capillary: 255 mg/dL — ABNORMAL HIGH (ref 70–99)

## 2023-10-03 LAB — BASIC METABOLIC PANEL
Anion gap: 8 (ref 5–15)
BUN: 13 mg/dL (ref 6–20)
CO2: 28 mmol/L (ref 22–32)
Calcium: 8.3 mg/dL — ABNORMAL LOW (ref 8.9–10.3)
Chloride: 96 mmol/L — ABNORMAL LOW (ref 98–111)
Creatinine, Ser: 2.22 mg/dL — ABNORMAL HIGH (ref 0.61–1.24)
GFR, Estimated: 35 mL/min — ABNORMAL LOW (ref >60)
Glucose, Bld: 118 mg/dL — ABNORMAL HIGH (ref 70–99)
Potassium: 4.1 mmol/L (ref 3.5–5.1)
Sodium: 132 mmol/L — ABNORMAL LOW (ref 135–145)

## 2023-10-03 LAB — PHOSPHORUS: Phosphorus: 3 mg/dL (ref 2.5–4.6)

## 2023-10-03 MED ORDER — CHLORHEXIDINE GLUCONATE CLOTH 2 % EX PADS
6.0000 | MEDICATED_PAD | Freq: Every day | CUTANEOUS | Status: DC
  Administered 2023-10-03 – 2023-10-04 (×2): 6 via TOPICAL

## 2023-10-03 NOTE — Progress Notes (Signed)
Home [facility] unit delivered to bedside. Patient currently with wet to dry dressing on sacrum due to blockage in wound vac. Wound care to reapply wound vac dressing on Monday. Patient to discharge after. Wife aware of plan.

## 2023-10-03 NOTE — Progress Notes (Signed)
[facility] Progress Note   Subjective:   Pt seen in room, sleeping, appears comfortable. Opens eyes briefly to voice then back to sleep.   Objective Vitals:   10/02/23 1617 10/02/23 2004 10/03/23 0452 10/03/23 0853  BP: 119/86 113/70 111/74 122/78  Pulse: 87 80 81 74  Resp: 18 18 16 18   Temp: 98.3 F (36.8 C) 98.1 F (36.7 C) 97.6 F (36.4 C) 97.6 F (36.4 C)  TempSrc: Oral  Oral Oral  SpO2: 99% 98% 98% 100%  Weight:   85.7 kg   Height:       Physical Exam General: Sleeping male in NAD Heart: RRR, no murmurs, rubs or gallops Lungs: CTA bilaterally, respirations unlabored Abdomen: Soft, non-distended, +BS Extremities: No edema b/l lower extremities Dialysis Access:  [facility] w/ intact bandage  Additional Objective Labs: Basic Metabolic Panel: Recent Labs  Lab 09/28/23 0535 09/30/23 0553 10/02/23 1403 10/03/23 0450  NA 131* 131* 135 132*  K 4.5 4.4 3.4* 4.1  CL 102 101 97* 96*  CO2 23 24 27 28   GLUCOSE 102* 133* 114* 118*  BUN 31* 35* 10 13  CREATININE 2.80* 2.81* 1.59* 2.22*  CALCIUM 8.2* 8.1* 8.2* 8.3*  PHOS 3.7  --  1.9*  --    Liver Function Tests: Recent Labs  Lab 09/28/23 0535 10/02/23 1403  ALBUMIN 1.5* 2.0*   No results for input(s): "LIPASE", "AMYLASE" in the last 168 hours. CBC: Recent Labs  Lab 09/27/23 0440 09/28/23 0535 09/30/23 0553 10/02/23 1403 10/03/23 0450  WBC 10.5 9.3 14.1* 14.7* 12.1*  HGB 7.8* 7.6* 8.0* 9.2* 8.9*  HCT 23.9* 22.5* 23.6* 28.0* 27.8*  MCV 96.4 94.9 95.9 96.2 97.5  PLT 227 253 297 328 364   Blood Culture    Component Value Date/Time   SDES BLOOD RIGHT ARM 09/17/2023 2024   SPECREQUEST  09/17/2023 2024    BOTTLES DRAWN AEROBIC AND ANAEROBIC Blood Culture adequate volume   CULT  09/17/2023 2024    NO GROWTH 5 DAYS Performed at [facility], [address], [place], [place] [address]    REPTSTATUS 09/22/2023 FINAL 09/17/2023 2024    Cardiac Enzymes: No results for input(s): "CKTOTAL",  "CKMB", "CKMBINDEX", "TROPONINI" in the last 168 hours. CBG: Recent Labs  Lab 10/02/23 1253 10/02/23 1615 10/02/23 2116 10/03/23 0340 10/03/23 0715  GLUCAP 123* 115* 139* 114* 113*   Iron Studies: No results for input(s): "IRON", "TIBC", "TRANSFERRIN", "FERRITIN" in the last 72 hours. @lablastinr3 @ Studies/Results: No results found. Medications:  piperacillin-tazobactam (ZOSYN)  IV 2.25 g (10/03/23 0524)    amiodarone  100 mg Oral Daily   amLODipine  10 mg Oral Daily   ascorbic acid  500 mg Oral BID   atorvastatin  40 mg Oral QHS   carvedilol  25 mg Oral BID WC   Chlorhexidine Gluconate Cloth  6 each Topical Q0600   cloNIDine  0.1 mg Oral Daily   darbepoetin (ARANESP) injection - DIALYSIS  100 mcg Subcutaneous Q Sat-1800   diphenhydrAMINE  25 mg Oral Once   feeding supplement  237 mL Oral BID BM   finasteride  5 mg Oral Daily   hydrALAZINE  25 mg Oral Q8H   influenza vac split trivalent PF  0.5 mL Intramuscular Tomorrow-1000   insulin aspart  0-5 Units Subcutaneous QHS   insulin aspart  0-6 Units Subcutaneous TID WC   insulin glargine-yfgn  5 Units Subcutaneous Daily   leptospermum manuka honey  1 Application Topical Daily   levETIRAcetam  1,500 mg Oral BID   losartan  50 mg Oral Daily   multivitamin  1 tablet Oral QHS   nutrition supplement (JUVEN)  1 packet Oral BID BM   pantoprazole  40 mg Oral BID   PARoxetine  10 mg Oral Daily   sodium chloride flush  3 mL Intravenous Q12H   terazosin  1 mg Oral QHS   vitamin A  50,000 Units Oral Daily   Vitamin D (Ergocalciferol)  50,000 Units Oral Q7 days    Dialysis Orders: [facility]  4hrs 500/AF 1.5,  EDW 98.6kg, 3.0 K/ 2.5Ca TDC  -No Heparin  - Mircera 50 mcg q 2 weeks - Not started yet - Hectorol IV TIW     Assessment/Plan: 1.Vomiting - Daily for months, now resolved per patient, G tube malfunction reportedly resolved 2.Hyponatremia-Na in 130's. Volume management with HD.  Suspect this is a chronic issue. Follow  labs.  3. ESRD -  On HD TTS. Started during prolonged hospitalization but previously followed for CKD stage 5 planning for HD.  Suspect muscle wasting causing falsely low SCr. Truncated HD 09/28/2023.  Next HD 10/02/2023. Due to the holiday, his HD schedule will be Monday, Wednesday, Saturday next week.  4. Leukocytosis -  UTI suspected, ABX started.  Has sacral decub status post 11/11 surgical bedside debridement, wound care following. 5.  Hypertension/volume  - BP well controlled. He did have pleural effusions noted on CT. Variable weights here. Continue UF with HD as tolerated.  6.  Anemia of CKD -Hgb 8.9 s/p 1 unit PRBC. Aranesp 60 mcg weekly.Increase dose 09/28/2023 to 100 mcg SQ.  7.  Secondary Hyperparathyroidism -  CCa  improved, not on VDRA. Phos trending back down, s/p supplementation earlier this admission. Added on to labs today 8. Hx hemorrhagic stroke -residual left-sided hemiparesis, per primary team. No heparin with HD.  9. [facility] consult for permanent access. Patient's wife is interested in home HD. Dr. Valentino Nose is aware. Unfortunately Dr. Lenell Antu does not find him stable enough at present to proceed with permanent access at this time. Will return to [facility] as OP.     Rogers Blocker, PA-C 10/03/2023, 10:43 AM  Head of [facility] Pager: [phone]

## 2023-10-03 NOTE — Discharge Summary (Signed)
Physician Discharge Summary  Bruce Little GMW:[id number] DOB: 11-Jan-1971 DOA: 09/17/2023  PCP: Westley Hummer, MD  Admit date: 09/17/2023 Discharge date: 10/03/2023  Admitted From: Home Disposition:  Home  Recommendations for Outpatient Follow-up:  Follow up with PCP in 1-2 weeks Follow up with nephrology/HD as scheduled Follow up with surgery/wound care in regards to wound vac  Discharge Condition:Stable  CODE STATUS:Full  Diet recommendation: As tolerated   Brief/Interim Summary: This is a 52 year old male with DM, PAF on Eliquis in the past, CKD now on dialysis, left BKA who comes into the hospital with intermittent nausea and vomiting, fever, and clogged PEG tube.   He has had a prolonged hospitalization at [facility] in [place] in August, admitted there 04/30/2023 and discharged 07/01/2023.  Hospital course reviewed.  He was visiting [place] from [place], was in a hotel when he was found to have altered mental status, vomiting.  He was found to be hypertensive in the ER with a blood pressure of 226/100, and a CT of the head showed 9.2 x 5.5 cm right frontal temporal parenchymal bleed with edema, mass effect and 1.1 cm left midline shift.  He is status post craniectomy and hematoma evacuation and EVD placement.  Hospital course complicated by Staph epidermidis in the CSF 7/17, will repeat growth 7/22 and 7/24.  Eventually EVD was removed, and there were no plans to replace the bone flap and will need artificial plate eventually.  He will developed sacral decubitus ulcer and underwent serial debridements, sacral wounds grew E. coli, Morganella and Enterococcus faecalis and placed of antibiotics for several weeks.  Hospital course was also complicated by C. difficile diarrhea status post full course of vancomycin while being on IV antibiotics also, and in addition, had a PEG and a trach and developed renal failure requiring dialysis.   Following [place]  hospitalization, he was discharged to select LT[facility] and he was there August 22 through August 10, 2023.  Worth mentioning is that he was decannulated on 07/23/2023, and weaned off to room air, and also with ongoing SLP he was started on p.o. intake along with his PEG tube.  He has been in rehab through October, but apparently has been home for couple of weeks prior to being here.  Patient admitted as above with sepsis secondary to presumed UTI as well as sacral decubitus ulcer with wound VAC dysfunction.  Patient initially treated for UTI, cultures resulted with Pseudomonas.  Patient required debridement at bedside with general surgery on 11/11, initial wound VAC issues in the setting of profound diarrhea likely caused by tube feeds.  Patient's tube feeds were weaned off, his diarrhea improved, and wound VAC was initially placed with no further issues or events.  He is otherwise stable for discharge home.  Prior to discharge patient had notable cloudy urine, mild leukocytosis but afebrile, given recent infection and Foley catheter placement discussed case with ID who recommended additional week of antibiotics to cover for potentially undertreated UTI.  Patient otherwise stable and agreeable for discharge home, tolerating p.o. well, pain well-controlled, wound VAC intact with ongoing hemodialysis per nephrology.  Multiple medication changes as below.  Patient remains medically stable for discharge - wound vac delivery pending for safe dispo.  Discharge Diagnoses:  Principal Problem:   Vomiting Active Problems:   Renal insufficiency   HTN (hypertension)   UTI (urinary tract infection)   Leukocytosis   Sacral decubitus ulcer   ICH (intracerebral hemorrhage) (HCC)   Malnutrition of moderate degree  Discharge Instructions  Discharge Instructions     Ambulatory referral to Physical Medicine Rehab   Complete by: As directed    Discharge patient   Complete by: As directed    Discharge disposition:  01-Home or Self Care   Discharge patient date: 10/02/2023      Allergies as of 10/03/2023   No Known Allergies      Medication List     TAKE these medications    albuterol 108 (90 Base) MCG/ACT inhaler Commonly known as: VENTOLIN HFA Inhale 2 puffs into the lungs every 4 (four) hours as needed.   amiodarone 100 MG tablet Commonly known as: PACERONE Take 100 mg by mouth daily.   amLODipine 10 MG tablet Commonly known as: NORVASC Take 10 mg by mouth daily.   amoxicillin-clavulanate 875-125 MG tablet Commonly known as: AUGMENTIN Take 1 tablet by mouth 2 (two) times daily for 6 days.   ascorbic acid 500 MG tablet Commonly known as: VITAMIN C Take 1 tablet (500 mg total) by mouth 2 (two) times daily.   atorvastatin 40 MG tablet Commonly known as: LIPITOR Take 40 mg by mouth at bedtime.   carvedilol 25 MG tablet Commonly known as: COREG Take 1 tablet (25 mg total) by mouth 2 (two) times daily with a meal. What changed:  medication strength how much to take   cloNIDine 0.1 MG tablet Commonly known as: CATAPRES Take 1 tablet (0.1 mg total) by mouth daily. What changed:  how much to take when to take this   feeding supplement Liqd Take 237 mLs by mouth 2 (two) times daily between meals.   nutrition supplement (JUVEN) Pack Take 1 packet by mouth 2 (two) times daily between meals.   finasteride 5 MG tablet Commonly known as: PROSCAR Take 5 mg by mouth daily.   food thickener Gel Commonly known as: SIMPLYTHICK (NECTAR/LEVEL 2/MILDLY THICK) Take 1 packet by mouth as needed.   furosemide 40 MG tablet Commonly known as: LASIX Take 40 mg by mouth daily.   glipiZIDE 5 MG tablet Commonly known as: Glucotrol Take 1 tablet (5 mg total) by mouth daily.   hydrALAZINE 25 MG tablet Commonly known as: APRESOLINE Take 25 mg by mouth every 8 (eight) hours.   Lantus SoloStar 100 UNIT/ML Solostar Pen Generic drug: insulin glargine Inject 5 Units into the skin  daily. What changed: when to take this   leptospermum manuka honey Pste paste Apply 1 Application topically daily.   levETIRAcetam 750 MG tablet Commonly known as: KEPPRA Take 1,500 mg by mouth 2 (two) times daily.   losartan 50 MG tablet Commonly known as: COZAAR Take 1 tablet (50 mg total) by mouth daily. What changed:  medication strength how much to take   Melatonin 10 MG Tabs Take 1 tablet by mouth at bedtime.   methocarbamol 500 MG tablet Commonly known as: ROBAXIN Take 1 tablet (500 mg total) by mouth every 8 (eight) hours as needed for muscle spasms.   multivitamin Tabs tablet Take 1 tablet by mouth at bedtime.   ondansetron 4 MG tablet Commonly known as: Zofran Take 1 tablet (4 mg total) by mouth every 8 (eight) hours as needed for nausea or vomiting.   pantoprazole 40 MG tablet Commonly known as: PROTONIX Take 1 tablet (40 mg total) by mouth daily. What changed: when to take this   PARoxetine 20 MG tablet Commonly known as: PAXIL Take 10 mg by mouth daily.   terazosin 1 MG capsule Commonly known as: HYTRIN Take  1 mg by mouth at bedtime.   vitamin A 3 MG (10000 UNITS) capsule Take 5 capsules (50,000 Units total) by mouth daily for 5 days.   Vitamin D (Ergocalciferol) 1.25 MG (50000 UNIT) Caps capsule Commonly known as: DRISDOL Take 1 capsule (50,000 Units total) by mouth every 7 (seven) days.        Follow-up Information     [facility] Follow up.   Specialty: Home Health Services Why: Someone will call you to schedule resumption of care visit. Contact information: [address] [place] [address] [phone]                No Known Allergies  Consultations: Nephrology, general surgery, wound care, vascular surgery  Procedures/Studies: VAS [place] UPPER EXT VEIN MAPPING (PRE-OP AVF)  Result Date: 09/29/2023 UPPER EXTREMITY VEIN MAPPING Patient Name:  VINCENT BRIDWELL  Date of Exam:   09/29/2023  Medical Rec #: [id number]     Accession #:    [id number] Date of Birth: 09/14/1971     Patient Gender: M Patient Age:   52 years Exam Location:  [facility] Procedure:      VAS [place] UPPER EXT VEIN MAPPING (PRE-OP AVF) Referring Phys: [facility] --------------------------------------------------------------------------------  Indications: History of PAD; patient is pre-operative for bypass. Comparison Study: No prior study Performing Technologist: Shona Simpson  Examination Guidelines: A complete evaluation includes B-mode imaging, spectral Doppler, color Doppler, and power Doppler as needed of all accessible portions of each vessel. Bilateral testing is considered an integral part of a complete examination. Limited examinations for reoccurring indications may be performed as noted. +-----------------+-------------+----------+---------+ Right Cephalic   Diameter (cm)Depth (cm)Findings  +-----------------+-------------+----------+---------+ Shoulder             0.46        1.22             +-----------------+-------------+----------+---------+ Prox upper arm       0.36        0.59             +-----------------+-------------+----------+---------+ Mid upper arm        0.37        0.36             +-----------------+-------------+----------+---------+ Dist upper arm       0.40        0.42   branching +-----------------+-------------+----------+---------+ Antecubital fossa    0.63        0.15   branching +-----------------+-------------+----------+---------+ Prox forearm         0.45        0.46             +-----------------+-------------+----------+---------+ Mid forearm          0.41        0.41             +-----------------+-------------+----------+---------+ Dist forearm         0.34        0.22             +-----------------+-------------+----------+---------+ Wrist                0.37        0.22   branching  +-----------------+-------------+----------+---------+ +-----------------+-------------+----------+--------------+ Right Basilic    Diameter (cm)Depth (cm)   Findings    +-----------------+-------------+----------+--------------+ Shoulder  not visualized +-----------------+-------------+----------+--------------+ Prox upper arm                          not visualized +-----------------+-------------+----------+--------------+ Mid upper arm                           not visualized +-----------------+-------------+----------+--------------+ Dist upper arm       0.80        1.33     branching    +-----------------+-------------+----------+--------------+ Antecubital fossa    0.78        1.50     branching    +-----------------+-------------+----------+--------------+ Prox forearm         0.42        0.18     branching    +-----------------+-------------+----------+--------------+ Mid forearm          0.41        0.18                  +-----------------+-------------+----------+--------------+ Distal forearm       0.34        0.22                  +-----------------+-------------+----------+--------------+ Wrist                0.30        0.21                  +-----------------+-------------+----------+--------------+ +-----------------+-------------+----------+---------+ Left Cephalic    Diameter (cm)Depth (cm)Findings  +-----------------+-------------+----------+---------+ Shoulder             0.30        1.68             +-----------------+-------------+----------+---------+ Prox upper arm       0.30        0.90             +-----------------+-------------+----------+---------+ Mid upper arm        0.30        0.43             +-----------------+-------------+----------+---------+ Dist upper arm       0.29        0.52   branching +-----------------+-------------+----------+---------+ Antecubital fossa    0.47         0.53   branching +-----------------+-------------+----------+---------+ Prox forearm         0.19        0.71   branching +-----------------+-------------+----------+---------+ Mid forearm          0.24        0.41             +-----------------+-------------+----------+---------+ Dist forearm         0.15        0.21   branching +-----------------+-------------+----------+---------+ Wrist                0.15        0.23             +-----------------+-------------+----------+---------+ +-----------------+-------------+----------+--------------+ Left Basilic     Diameter (cm)Depth (cm)   Findings    +-----------------+-------------+----------+--------------+ Shoulder                                not visualized +-----------------+-------------+----------+--------------+ Prox upper arm       0.51        1.47  branching    +-----------------+-------------+----------+--------------+ Mid upper arm        0.39        1.14                  +-----------------+-------------+----------+--------------+ Dist upper arm       0.46        0.79                  +-----------------+-------------+----------+--------------+ Antecubital fossa    0.70        0.39     branching    +-----------------+-------------+----------+--------------+ Prox forearm         0.33        0.32                  +-----------------+-------------+----------+--------------+ Mid forearm          0.30        0.35                  +-----------------+-------------+----------+--------------+ Distal forearm       0.26        0.25                  +-----------------+-------------+----------+--------------+ Wrist                0.30        0.23     branching    +-----------------+-------------+----------+--------------+ *See table(s) above for measurements and observations.  Diagnosing physician: Heath Lark Electronically signed by Heath Lark on 09/29/2023 at 6:12:04 PM.    Final     CT HEAD WO CONTRAST ( )  Result Date: 09/28/2023 CLINICAL DATA:  Stroke follow-up EXAM: CT HEAD WITHOUT CONTRAST TECHNIQUE: Contiguous axial images were obtained from the base of the skull through the vertex without intravenous contrast. RADIATION DOSE REDUCTION: This exam was performed according to the departmental dose-optimization program which includes automated exposure control, adjustment of the mA and/or kV according to patient size and/or use of iterative reconstruction technique. COMPARISON:  Nine days prior FINDINGS: Brain: Extensive encephalomalacia involving the right cerebral hemisphere with broad craniectomy. Although bulging appearance of the brain in some areas towards the vertex suspicious for vasogenic edema, there is overall volume loss and white matter low-density is primarily attributed to encephalomalacia. No evidence of acute infarct, acute hemorrhage, hydrocephalus, mass, or collection. There is cerebral volume loss and ventriculomegaly. Vascular: No hyperdense vessel or unexpected calcification. Skull: Broad craniectomy on the right. Sinuses/Orbits: Negative IMPRESSION: 1. No acute or interval finding. 2. Remote right cerebral insult with bulging at a broad right craniectomy. Electronically Signed   By: Tiburcio Pea M.D.   On: 09/28/2023 05:44   EEG adult  Result Date: 09/27/2023 Charlsie Quest, MD     09/27/2023  7:45 PM Patient Name: AHKING PRETZER MRN: [id number] Epilepsy Attending: Charlsie Quest Referring Physician/Provider: Leatha Gilding, MD Date: 09/27/2023 Duration: 26.25 mins Patient history: 52 yo M with h/o seizure getting eeg to evaluate for seizure Level of alertness: Awake, asleep AEDs during EEG study: LEV Technical aspects: This EEG study was done with scalp electrodes positioned according to the 10-20 International system of electrode placement. Electrical activity was reviewed with band pass filter of 1-70Hz , sensitivity of 7 uV/mm, display speed of  42mm/sec with a 60Hz  notched filter applied as appropriate. EEG data were recorded continuously and digitally stored.  Video monitoring was available and reviewed as appropriate. Description: The posterior dominant rhythm consists of 8-9 Hz activity  of moderate voltage (25-35 uV) seen predominantly in posterior head regions, symmetric and reactive to eye opening and eye closing. Sleep was characterized by vertex waves, sleep spindles (12 to 14 Hz), maximal frontocentral region. EEG showed continuous polymorphic sharply contoured 3 to 6 Hz theta-delta slowing in right hemisphere. There was also overriding 12-14Hz  beta activity in right centro-parietal region consistent with breach artifact. Hyperventilation and photic stimulation were not performed.   ABNORMALITY - Breach artifact, right centro-parietal region - Continuous slow, right hemisphere IMPRESSION: This study is suggestive of cortical dysfunction arising from right centro-parietal region  consistent with underlying craniotomy. Additionally there is cortical dysfunction in right hemisphere likely secondary to underlying stroke. No seizures were seen throughout the recording. Priyanka Annabelle Harman   DG ABDOMEN PEG TUBE LOCATION  Result Date: 09/20/2023 CLINICAL DATA:  [id number] PEG (percutaneous endoscopic gastrostomy) adjustment/replacement/removal [facility]) [id number] EXAM: ABDOMEN - 1 VIEW COMPARISON:  CT abdomen pelvis 09/17/23, x-ray abdomen 08/02/2023 FINDINGS: Gastrostomy tube overlies the gastric lumen with PO contrast partially opacifying the gastric lumen. The bowel gas pattern is normal. No radio-opaque calculi or other significant radiographic abnormality are seen. IMPRESSION: Gastrostomy tube in appropriate position. Electronically Signed   By: Tish Frederickson M.D.   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 Current gastrostomy has been clogged for several weeks per patient's wife. Request for removal of existing pull through gastrostomy and placement of new balloon retention gastrostomy for ongoing nutritional needs. EXAM: BEDSIDE REMOVAL AND PLACEMENT OF NEW GASTROSTOMY TUBE WITHIN EXISTING TRACT COMPARISON:  None Available. MEDICATIONS: None. CONTRAST:  30 mL gastrografin - administered into the gastric lumen FLUOROSCOPY TIME:  None COMPLICATIONS: None immediate. PROCEDURE: Informed written consent was obtained from the patient's wife Shiraz Dorson after a discussion of the risks, benefits and alternatives to treatment. Questions regarding the procedure were encouraged and answered. A timeout was performed prior to the initiation of the procedure. The upper abdomen and external portion of the existing gastrostomy tube was prepped and draped in the usual sterile fashion, and a sterile drape was applied covering the operative field. Maximum barrier sterile technique with sterile gowns and gloves were used for the procedure. A timeout was performed prior to the initiation of the procedure. The existing gastrostomy tube was removed in tact with gentle traction. Next, a 20 Fr balloon retention Entuit gastrostomy tube was lubricated with viscous lidocaine and placed within the existing tract. Immediate return of blood was noted within the lumen. The balloon was inflated with 20 mL of normal saline, pulled against the anterior inner lumen of the stomach and the external disc cinched to the skin. Both lumens flushed easily with 10 mL normal saline. KUB with gastrografin injection obtained post procedure which shows the gastrostomy tube to be within the gastric lumen. IMPRESSION: Successful removal of existing gastrostomy tube and placement of a new 20-French gastrostomy tube. The gastrostomy tube is ready for immediate use. Performed by Lynnette Caffey,  PA-C Electronically Signed   By: Marliss Coots M.D.   On: 09/20/2023 14:45   CT HEAD WO CONTRAST ( )  Result Date: 09/18/2023 CLINICAL DATA:  Stroke in June. Left-sided paralysis. Sent to ED by primary care. EXAM: CT HEAD WITHOUT CONTRAST TECHNIQUE: Contiguous axial images were obtained from the base of the skull through the vertex without intravenous contrast. RADIATION DOSE  REDUCTION: This exam was performed according to the departmental dose-optimization program which includes automated exposure control, adjustment of the mA and/or kV according to patient size and/or use of iterative reconstruction technique. COMPARISON:  MRI head 05/19/2021. FINDINGS: Brain: No intracranial hemorrhage, mass effect, or evidence of acute infarct. Extensive encephalomalacia in the right cerebral hemisphere compatible with chronic infarct. Ex vacuo dilatation ventricles. Basal cisterns are patent. No extra-axial fluid collection. Age-commensurate cerebral atrophy and chronic small vessel ischemic disease. Vascular: No hyperdense vessel. Intracranial arterial calcification. Skull: No fracture or focal lesion.  Right craniectomy. Sinuses/Orbits: Opacification of the few mastoid air cells bilaterally. Mucosal thickening left maxillary sinus. Other: None. IMPRESSION: 1. No acute intracranial abnormality. 2. Extensive chronic infarct in the right cerebral hemisphere. Electronically Signed   By: Minerva Fester M.D.   On: 09/18/2023 03:19   CT ABDOMEN PELVIS W CONTRAST  Result Date: 09/17/2023 CLINICAL DATA:  Nausea, vomiting, possible UTI.  GI tube clogged EXAM: CT ABDOMEN AND PELVIS WITH CONTRAST TECHNIQUE: Multidetector CT imaging of the abdomen and pelvis was performed using the standard protocol following bolus administration of intravenous contrast. RADIATION DOSE REDUCTION: This exam was performed according to the departmental dose-optimization program which includes automated exposure control, adjustment of the mA and/or  kV according to patient size and/or use of iterative reconstruction technique. CONTRAST:  75mL OMNIPAQUE IOHEXOL 350 MG/ML SOLN COMPARISON:  Renal ultrasound 08/04/2023 and radiographs 08/02/2023 FINDINGS: Lower chest: Small left-greater-than-right pleural effusions and associated atelectasis. Hepatobiliary: Mild distention of the gallbladder. Hyperdense layering sludge in the gallbladder. No biliary dilation. Liver is unremarkable. Pancreas: Unremarkable. Spleen: Unremarkable. Adrenals/Urinary Tract: Unremarkable adrenal glands. No urinary calculi or hydronephrosis. Foley catheter in the nondistended bladder. Stomach/Bowel: Normal caliber large and small bowel. Moderate colonic stool load. Normal appendix. Stomach is within normal limits. Expected location of the percutaneous gastrostomy tube in the stomach. Vascular/Lymphatic: Aortic atherosclerosis. No enlarged abdominal or pelvic lymph nodes. Reproductive: No acute abnormality. Other: Smaller free fluid in the pelvis. No free intraperitoneal air. Musculoskeletal: Resorption of the inferior sacrum and coccyx compatible with osteomyelitis. There is a large sacral decubitus ulcer measuring 7.4 x 6.6 x 3.9 cm and abutting the inferior portion of the remaining sacrum. Adjacent fat stranding and subcutaneous and presacral edema. No abscess. No soft tissue gas. IMPRESSION: 1. Large sacral decubitus ulcer with resorption of the inferior sacrum and coccyx. No abscess. 2. Small left-greater-than-right pleural effusions and associated atelectasis. 3. Expected location of the percutaneous gastrostomy tube in the stomach. 4. Foley catheter in the nondistended bladder. Aortic Atherosclerosis (ICD10-I70.0). Electronically Signed   By: Minerva Fester M.D.   On: 09/17/2023 20:35     Subjective: No acute issues or events overnight, tolerating dialysis well   Discharge Exam: Vitals:   10/02/23 2004 10/03/23 0452  BP: 113/70 111/74  Pulse: 80 81  Resp: 18 16  Temp:  98.1 F (36.7 C) 97.6 F (36.4 C)  SpO2: 98% 98%   Vitals:   10/02/23 1253 10/02/23 1617 10/02/23 2004 10/03/23 0452  BP: (!) 164/103 119/86 113/70 111/74  Pulse: (!) 105 87 80 81  Resp: 20 18 18 16   Temp: 98 F (36.7 C) 98.3 F (36.8 C) 98.1 F (36.7 C) 97.6 F (36.4 C)  TempSrc:  Oral  Oral  SpO2: 99% 99% 98% 98%  Weight:    85.7 kg  Height:        General: Pt is alert, awake, not in acute distress Cardiovascular: RRR, S1/S2 +, no rubs, no gallops Respiratory:  CTA bilaterally, no wheezing, no rhonchi Abdominal: Soft, NT, ND, bowel sounds + Extremities: no edema, no cyanosis   The results of significant diagnostics from this hospitalization (including imaging, microbiology, ancillary and laboratory) are listed below for reference.     Microbiology: No results found for this or any previous visit (from the past 240 hour(s)).   Labs: BNP (last 3 results) No results for input(s): "BNP" in the last 8760 hours. Basic Metabolic Panel: Recent Labs  Lab 09/27/23 0440 09/28/23 0535 09/30/23 0553 10/02/23 1403 10/03/23 0450  NA 130* 131* 131* 135 132*  K 4.4 4.5 4.4 3.4* 4.1  CL 100 102 101 97* 96*  CO2 24 23 24 27 28   GLUCOSE 112* 102* 133* 114* 118*  BUN 28* 31* 35* 10 13  CREATININE 2.42* 2.80* 2.81* 1.59* 2.22*  CALCIUM 8.0* 8.2* 8.1* 8.2* 8.3*  MG  --  1.7  --   --   --   PHOS  --  3.7  --  1.9*  --    Liver Function Tests: Recent Labs  Lab 09/28/23 0535 10/02/23 1403  ALBUMIN 1.5* 2.0*   No results for input(s): "LIPASE", "AMYLASE" in the last 168 hours. No results for input(s): "AMMONIA" in the last 168 hours. CBC: Recent Labs  Lab 09/27/23 0440 09/28/23 0535 09/30/23 0553 10/02/23 1403 10/03/23 0450  WBC 10.5 9.3 14.1* 14.7* 12.1*  HGB 7.8* 7.6* 8.0* 9.2* 8.9*  HCT 23.9* 22.5* 23.6* 28.0* 27.8*  MCV 96.4 94.9 95.9 96.2 97.5  PLT 227 253 297 328 364   Cardiac Enzymes: No results for input(s): "CKTOTAL", "CKMB", "CKMBINDEX", "TROPONINI" in  the last 168 hours. BNP: Invalid input(s): "POCBNP" CBG: Recent Labs  Lab 10/02/23 1253 10/02/23 1615 10/02/23 2116 10/03/23 0340 10/03/23 0715  GLUCAP 123* 115* 139* 114* 113*   D-Dimer No results for input(s): "DDIMER" in the last 72 hours. Hgb A1c No results for input(s): "HGBA1C" in the last 72 hours. Lipid Profile No results for input(s): "CHOL", "HDL", "LDLCALC", "TRIG", "CHOLHDL", "LDLDIRECT" in the last 72 hours. Thyroid function studies No results for input(s): "TSH", "T4TOTAL", "T3FREE", "THYROIDAB" in the last 72 hours.  Invalid input(s): "FREET3" Anemia work up No results for input(s): "VITAMINB12", "FOLATE", "FERRITIN", "TIBC", "IRON", "RETICCTPCT" in the last 72 hours. Urinalysis    Component Value Date/Time   COLORURINE YELLOW 09/17/2023 1004   APPEARANCEUR CLOUDY (A) 09/17/2023 1004   LABSPEC 1.025 09/17/2023 1004   PHURINE 8.0 09/17/2023 1004   GLUCOSEU 50 (A) 09/17/2023 1004   HGBUR MODERATE (A) 09/17/2023 1004   BILIRUBINUR NEGATIVE 09/17/2023 1004   KETONESUR NEGATIVE 09/17/2023 1004   PROTEINUR 100 (A) 09/17/2023 1004   NITRITE NEGATIVE 09/17/2023 1004   LEUKOCYTESUR LARGE (A) 09/17/2023 1004   Sepsis Labs Recent Labs  Lab 09/28/23 0535 09/30/23 0553 10/02/23 1403 10/03/23 0450  WBC 9.3 14.1* 14.7* 12.1*   Microbiology No results found for this or any previous visit (from the past 240 hour(s)).   Time coordinating discharge: Over 30 minutes  SIGNED:   [name], DO [facility] 10/03/2023, 8:38 AM Pager   If 7PM-7AM, please contact night-coverage [email or web address]

## 2023-10-03 NOTE — TOC Progression Note (Addendum)
Transition of Care [facility]) - Progression Note    Patient Details  Name: Bruce Little MRN: [id number] Date of Birth: 1971-05-30  Transition of Care [facility]) CM/SW Contact  Ronny Bacon, RN Phone Number: 10/03/2023, 8:22 AM  Clinical Narrative:   Call from [facility] with [employer], will send information needed to obtain wound vac for home use. Once information is obtained I will retreive the wound vac and deliver to patient bedside. Provider updated on plan of care.   0900: Wound Vac serial number [id number] delivered to patient bedside, floor nurse aware. Per patient, wife can transport him home at discharge. Signed proof of delivery form emailed to Anna Maria with [employer].     Barriers to Discharge: No Barriers Identified  Expected Discharge Plan and Services         Expected Discharge Date: 10/02/23                                     Social Determinants of Health (SDOH) Interventions SDOH Screenings   Food Insecurity: No Food Insecurity (09/17/2023)  Housing: Low Risk  (09/17/2023)  Transportation Needs: No Transportation Needs (09/17/2023)  Utilities: Not At Risk (09/17/2023)  Depression (PHQ2-9): Low Risk  (08/27/2021)  Financial Resource Strain: Low Risk  (08/10/2023)   Received from [facility]  Social Connections: Unknown (08/13/2023)   Received from [facility]  Stress: No Stress Concern Present (08/10/2023)   Received from [facility]  Tobacco Use: Low Risk  (09/20/2023)    Readmission Risk Interventions    09/22/2023   11:18 AM 07/11/2021    3:04 PM  Readmission Risk Prevention Plan  Transportation Screening Complete Complete  PCP or Specialist Appt within 5-7 Days Complete Complete  Home Care Screening Complete Complete  Medication Review (RN CM) Referral to Pharmacy Complete

## 2023-10-03 NOTE — Discharge Planning (Signed)
[facility] Patient Discharge Orders- [facility] CLINIC: [place]  Patient's name: Bruce Little Admit/DC Dates: 09/17/2023 - 10/03/23  Discharge Diagnoses: Vomiting   UTI ESRD  Aranesp: Given: Yes   Date and amount of last dose: on 10/02/23 Last Hgb: 8.9 PRBC's Given: Yes Date/# of units: 1 unit on ESA dose for discharge: mircera 100 mcg IV q 2 weeks  IV Iron dose at discharge: none  Heparin change: No  EDW Change: Yes New EDW: 85.5kg  [place]ath Change: No  Access intervention/Change: no Details:  Hectorol/Calcitriol change: no  Discharge Labs: Calcium 8.3 Phosphorus 1.9 Albumin 2.0 K+ 4.1  IV Antibiotics: no Details:  On Coumadin?: no Last INR: Next INR: Managed By:   OTHER/APPTS/LAB ORDERS: Some low phosphorus here- please check phos next HD    D/C Meds to be reconciled by nurse after every discharge.  Completed By: Rogers Blocker, PA-C 10/03/2023, 11:51 AM   [facility] Pager: [phone]    Reviewed by: MD:______ RN_______

## 2023-10-04 DIAGNOSIS — N3000 Acute cystitis without hematuria: Secondary | ICD-10-CM | POA: Diagnosis not present

## 2023-10-04 DIAGNOSIS — E440 Moderate protein-calorie malnutrition: Secondary | ICD-10-CM | POA: Diagnosis not present

## 2023-10-04 DIAGNOSIS — L89154 Pressure ulcer of sacral region, stage 4: Secondary | ICD-10-CM | POA: Diagnosis not present

## 2023-10-04 DIAGNOSIS — I10 Essential (primary) hypertension: Secondary | ICD-10-CM | POA: Diagnosis not present

## 2023-10-04 LAB — GLUCOSE, CAPILLARY: Glucose-Capillary: 105 mg/dL — ABNORMAL HIGH (ref 70–99)

## 2023-10-04 MED ORDER — HEPARIN SODIUM (PORCINE) 1000 UNIT/ML IJ SOLN
1000.0000 [IU] | INTRAMUSCULAR | Status: DC | PRN
  Administered 2023-10-04: 3200 [IU]
  Filled 2023-10-04 (×4): qty 1

## 2023-10-04 NOTE — Progress Notes (Signed)
Received patient in bed to unit.  Alert and oriented.  Informed consent signed and in chart.   TX duration: 3:00  Patient tolerated well; UF removal paused and 200 NSS bolus given for decreased BP, sx resolved with intervention. Transported back to the room  Alert, without acute distress.  Hand-off given to patient's nurse.   Access used: RIJ TDC Access issues: None  Total UF removed: 1500 mL Medication(s) given: None Post HD VS: please see data insert    10/04/23 0507  Vitals  Temp 98.2 F (36.8 C)  Temp Source Oral  BP 100/63  MAP (mmHg) 75  BP Location Right Arm  BP Method Automatic  Patient Position (if appropriate) Lying  Pulse Rate 75  Pulse Rate Source Monitor  ECG Heart Rate 75  Resp 15  Oxygen Therapy  SpO2 97 %  O2 Device Room Air  Patient Activity (if Appropriate) In bed  Pulse Oximetry Type Continuous  Post Treatment  Dialyzer Clearance Lightly streaked  Hemodialysis Intake (mL) 0 mL  Liters Processed 72  Fluid Removed (mL) 1500 mL  Tolerated HD Treatment Yes  Post-Hemodialysis Comments Treatment completed and blood returned without issue.  Note  Patient Observations Patient alert, no c/o voiced, no acute distress noted; patient condition stable for return transport.      [facility]

## 2023-10-04 NOTE — Progress Notes (Signed)
DISCHARGE NOTE HOME Bruce Little to be discharged Home per MD order. Discussed prescriptions and follow up appointments with the patient. Prescriptions given to patient; medication list explained in detail. Patient verbalized understanding.  Skin clean, dry and intact without evidence of skin break down, no evidence of skin tears noted. IV catheter discontinued intact. Site without signs and symptoms of complications. Dressing and pressure applied. Pt denies pain at the site currently. No complaints noted.  Patient free of lines. Home discharge with wound vac to coccyx in place An After Visit Summary (AVS) was printed and given to the patient. Patient escorted via wheelchair, and discharged home via private auto with wife  Margarita Grizzle, RN

## 2023-10-04 NOTE — Progress Notes (Signed)
D/C order noted. Contacted [facility] to make clinic aware of pt's d/c today and that pt should resume care on Wednesday (due to holiday schedule).   Olivia Canter Renal Navigator [phone]

## 2023-10-04 NOTE — Discharge Summary (Signed)
Physician Discharge Summary  Bruce Little ZOX:[id number] DOB: 07/18/1971 DOA: 09/17/2023  PCP: Westley Hummer, MD  Admit date: 09/17/2023 Discharge date: 10/04/2023  Admitted From: Home Disposition:  Home  Recommendations for Outpatient Follow-up:  Follow up with PCP in 1-2 weeks Follow up with nephrology/HD as scheduled Follow up with surgery/wound care in regards to wound vac  Discharge Condition:Stable  CODE STATUS:Full  Diet recommendation: As tolerated   Brief/Interim Summary: This is a 52 year old male with DM, PAF on Eliquis in the past, CKD now on dialysis, left BKA who comes into the hospital with intermittent nausea and vomiting, fever, and clogged PEG tube.   He has had a prolonged hospitalization at [facility] in [place] in August, admitted there 04/30/2023 and discharged 07/01/2023.  Hospital course reviewed.  He was visiting [place] from [place], was in a hotel when he was found to have altered mental status, vomiting.  He was found to be hypertensive in the ER with a blood pressure of 226/100, and a CT of the head showed 9.2 x 5.5 cm right frontal temporal parenchymal bleed with edema, mass effect and 1.1 cm left midline shift.  He is status post craniectomy and hematoma evacuation and EVD placement.  Hospital course complicated by Staph epidermidis in the CSF 7/17, will repeat growth 7/22 and 7/24.  Eventually EVD was removed, and there were no plans to replace the bone flap and will need artificial plate eventually.  He will developed sacral decubitus ulcer and underwent serial debridements, sacral wounds grew E. coli, Morganella and Enterococcus faecalis and placed of antibiotics for several weeks.  Hospital course was also complicated by C. difficile diarrhea status post full course of vancomycin while being on IV antibiotics also, and in addition, had a PEG and a trach and developed renal failure requiring dialysis.   Following [place]  hospitalization, he was discharged to select LTAC and he was there August 22 through August 10, 2023.  Worth mentioning is that he was decannulated on 07/23/2023, and weaned off to room air, and also with ongoing SLP he was started on p.o. intake along with his PEG tube.  He has been in rehab through October, but apparently has been home for couple of weeks prior to being here.  Patient admitted as above with sepsis secondary to presumed UTI as well as sacral decubitus ulcer with wound VAC dysfunction.  Patient initially treated for UTI, cultures resulted with Pseudomonas.  Patient required debridement at bedside with general surgery on 11/11, initial wound VAC issues in the setting of profound diarrhea likely caused by tube feeds.  Patient's tube feeds were weaned off, his diarrhea improved, and wound VAC was initially placed with no further issues or events.  He is otherwise stable for discharge home.  Prior to discharge patient had notable cloudy urine, mild leukocytosis but afebrile, given recent infection and Foley catheter placement discussed case with ID who recommended additional week of antibiotics to cover for potentially undertreated UTI.  Patient otherwise stable and agreeable for discharge home, tolerating p.o. well, pain well-controlled, wound VAC intact with ongoing hemodialysis per nephrology.  Multiple medication changes as below.  Patient remains medically stable for discharge - wound vac delivery pending for safe dispo.  Discharge Diagnoses:  Principal Problem:   Vomiting Active Problems:   Renal insufficiency   HTN (hypertension)   UTI (urinary tract infection)   Leukocytosis   Sacral decubitus ulcer   ICH (intracerebral hemorrhage) (HCC)   Malnutrition of moderate degree  Discharge Instructions  Discharge Instructions     Ambulatory referral to Physical Medicine Rehab   Complete by: As directed    Discharge patient   Complete by: As directed    Discharge disposition:  01-Home or Self Care   Discharge patient date: 10/02/2023      Allergies as of 10/04/2023   No Known Allergies      Medication List     TAKE these medications    albuterol 108 (90 Base) MCG/ACT inhaler Commonly known as: VENTOLIN HFA Inhale 2 puffs into the lungs every 4 (four) hours as needed.   amiodarone 100 MG tablet Commonly known as: PACERONE Take 100 mg by mouth daily.   amLODipine 10 MG tablet Commonly known as: NORVASC Take 10 mg by mouth daily.   amoxicillin-clavulanate 875-125 MG tablet Commonly known as: AUGMENTIN Take 1 tablet by mouth 2 (two) times daily for 6 days.   ascorbic acid 500 MG tablet Commonly known as: VITAMIN C Take 1 tablet (500 mg total) by mouth 2 (two) times daily.   atorvastatin 40 MG tablet Commonly known as: LIPITOR Take 40 mg by mouth at bedtime.   carvedilol 25 MG tablet Commonly known as: COREG Take 1 tablet (25 mg total) by mouth 2 (two) times daily with a meal. What changed:  medication strength how much to take   cloNIDine 0.1 MG tablet Commonly known as: CATAPRES Take 1 tablet (0.1 mg total) by mouth daily. What changed:  how much to take when to take this   feeding supplement Liqd Take 237 mLs by mouth 2 (two) times daily between meals.   nutrition supplement (JUVEN) Pack Take 1 packet by mouth 2 (two) times daily between meals.   finasteride 5 MG tablet Commonly known as: PROSCAR Take 5 mg by mouth daily.   food thickener Gel Commonly known as: SIMPLYTHICK (NECTAR/LEVEL 2/MILDLY THICK) Take 1 packet by mouth as needed.   furosemide 40 MG tablet Commonly known as: LASIX Take 40 mg by mouth daily.   glipiZIDE 5 MG tablet Commonly known as: Glucotrol Take 1 tablet (5 mg total) by mouth daily.   hydrALAZINE 25 MG tablet Commonly known as: APRESOLINE Take 25 mg by mouth every 8 (eight) hours.   Lantus SoloStar 100 UNIT/ML Solostar Pen Generic drug: insulin glargine Inject 5 Units into the skin  daily. What changed: when to take this   leptospermum manuka honey Pste paste Apply 1 Application topically daily.   levETIRAcetam 750 MG tablet Commonly known as: KEPPRA Take 1,500 mg by mouth 2 (two) times daily.   losartan 50 MG tablet Commonly known as: COZAAR Take 1 tablet (50 mg total) by mouth daily. What changed:  medication strength how much to take   Melatonin 10 MG Tabs Take 1 tablet by mouth at bedtime.   methocarbamol 500 MG tablet Commonly known as: ROBAXIN Take 1 tablet (500 mg total) by mouth every 8 (eight) hours as needed for muscle spasms.   multivitamin Tabs tablet Take 1 tablet by mouth at bedtime.   ondansetron 4 MG tablet Commonly known as: Zofran Take 1 tablet (4 mg total) by mouth every 8 (eight) hours as needed for nausea or vomiting.   pantoprazole 40 MG tablet Commonly known as: PROTONIX Take 1 tablet (40 mg total) by mouth daily. What changed: when to take this   PARoxetine 20 MG tablet Commonly known as: PAXIL Take 10 mg by mouth daily.   terazosin 1 MG capsule Commonly known as: HYTRIN Take  1 mg by mouth at bedtime.   vitamin A 3 MG (10000 UNITS) capsule Take 5 capsules (50,000 Units total) by mouth daily for 5 days.   Vitamin D (Ergocalciferol) 1.25 MG (50000 UNIT) Caps capsule Commonly known as: DRISDOL Take 1 capsule (50,000 Units total) by mouth every 7 (seven) days.         Follow-up Information     [facility] Follow up.   Specialty: Home Health Services Why: Someone will call you to schedule resumption of care visit. Contact information: [address] [place] [address] [phone]                No Known Allergies  Consultations: Nephrology, general surgery, wound care, vascular surgery  Procedures/Studies: VAS [place] UPPER EXT VEIN MAPPING (PRE-OP AVF)  Result Date: 09/29/2023 UPPER EXTREMITY VEIN MAPPING Patient Name:  Bruce Little  Date of Exam:   09/29/2023  Medical Rec #: [id number]     Accession #:    [id number] Date of Birth: 02-24-1971     Patient Gender: M Patient Age:   76 years Exam Location:  [facility] Procedure:      VAS [place] UPPER EXT VEIN MAPPING (PRE-OP AVF) Referring Phys: [facility] --------------------------------------------------------------------------------  Indications: History of PAD; patient is pre-operative for bypass. Comparison Study: No prior study Performing Technologist: Shona Simpson  Examination Guidelines: A complete evaluation includes B-mode imaging, spectral Doppler, color Doppler, and power Doppler as needed of all accessible portions of each vessel. Bilateral testing is considered an integral part of a complete examination. Limited examinations for reoccurring indications may be performed as noted. +-----------------+-------------+----------+---------+ Right Cephalic   Diameter (cm)Depth (cm)Findings  +-----------------+-------------+----------+---------+ Shoulder             0.46        1.22             +-----------------+-------------+----------+---------+ Prox upper arm       0.36        0.59             +-----------------+-------------+----------+---------+ Mid upper arm        0.37        0.36             +-----------------+-------------+----------+---------+ Dist upper arm       0.40        0.42   branching +-----------------+-------------+----------+---------+ Antecubital fossa    0.63        0.15   branching +-----------------+-------------+----------+---------+ Prox forearm         0.45        0.46             +-----------------+-------------+----------+---------+ Mid forearm          0.41        0.41             +-----------------+-------------+----------+---------+ Dist forearm         0.34        0.22             +-----------------+-------------+----------+---------+ Wrist                0.37        0.22   branching  +-----------------+-------------+----------+---------+ +-----------------+-------------+----------+--------------+ Right Basilic    Diameter (cm)Depth (cm)   Findings    +-----------------+-------------+----------+--------------+ Shoulder  not visualized +-----------------+-------------+----------+--------------+ Prox upper arm                          not visualized +-----------------+-------------+----------+--------------+ Mid upper arm                           not visualized +-----------------+-------------+----------+--------------+ Dist upper arm       0.80        1.33     branching    +-----------------+-------------+----------+--------------+ Antecubital fossa    0.78        1.50     branching    +-----------------+-------------+----------+--------------+ Prox forearm         0.42        0.18     branching    +-----------------+-------------+----------+--------------+ Mid forearm          0.41        0.18                  +-----------------+-------------+----------+--------------+ Distal forearm       0.34        0.22                  +-----------------+-------------+----------+--------------+ Wrist                0.30        0.21                  +-----------------+-------------+----------+--------------+ +-----------------+-------------+----------+---------+ Left Cephalic    Diameter (cm)Depth (cm)Findings  +-----------------+-------------+----------+---------+ Shoulder             0.30        1.68             +-----------------+-------------+----------+---------+ Prox upper arm       0.30        0.90             +-----------------+-------------+----------+---------+ Mid upper arm        0.30        0.43             +-----------------+-------------+----------+---------+ Dist upper arm       0.29        0.52   branching +-----------------+-------------+----------+---------+ Antecubital fossa    0.47         0.53   branching +-----------------+-------------+----------+---------+ Prox forearm         0.19        0.71   branching +-----------------+-------------+----------+---------+ Mid forearm          0.24        0.41             +-----------------+-------------+----------+---------+ Dist forearm         0.15        0.21   branching +-----------------+-------------+----------+---------+ Wrist                0.15        0.23             +-----------------+-------------+----------+---------+ +-----------------+-------------+----------+--------------+ Left Basilic     Diameter (cm)Depth (cm)   Findings    +-----------------+-------------+----------+--------------+ Shoulder                                not visualized +-----------------+-------------+----------+--------------+ Prox upper arm       0.51        1.47  branching    +-----------------+-------------+----------+--------------+ Mid upper arm        0.39        1.14                  +-----------------+-------------+----------+--------------+ Dist upper arm       0.46        0.79                  +-----------------+-------------+----------+--------------+ Antecubital fossa    0.70        0.39     branching    +-----------------+-------------+----------+--------------+ Prox forearm         0.33        0.32                  +-----------------+-------------+----------+--------------+ Mid forearm          0.30        0.35                  +-----------------+-------------+----------+--------------+ Distal forearm       0.26        0.25                  +-----------------+-------------+----------+--------------+ Wrist                0.30        0.23     branching    +-----------------+-------------+----------+--------------+ *See table(s) above for measurements and observations.  Diagnosing physician: Heath Lark Electronically signed by Heath Lark on 09/29/2023 at 6:12:04 PM.    Final     CT HEAD WO CONTRAST ( )  Result Date: 09/28/2023 CLINICAL DATA:  Stroke follow-up EXAM: CT HEAD WITHOUT CONTRAST TECHNIQUE: Contiguous axial images were obtained from the base of the skull through the vertex without intravenous contrast. RADIATION DOSE REDUCTION: This exam was performed according to the departmental dose-optimization program which includes automated exposure control, adjustment of the mA and/or kV according to patient size and/or use of iterative reconstruction technique. COMPARISON:  Nine days prior FINDINGS: Brain: Extensive encephalomalacia involving the right cerebral hemisphere with broad craniectomy. Although bulging appearance of the brain in some areas towards the vertex suspicious for vasogenic edema, there is overall volume loss and white matter low-density is primarily attributed to encephalomalacia. No evidence of acute infarct, acute hemorrhage, hydrocephalus, mass, or collection. There is cerebral volume loss and ventriculomegaly. Vascular: No hyperdense vessel or unexpected calcification. Skull: Broad craniectomy on the right. Sinuses/Orbits: Negative IMPRESSION: 1. No acute or interval finding. 2. Remote right cerebral insult with bulging at a broad right craniectomy. Electronically Signed   By: Tiburcio Pea M.D.   On: 09/28/2023 05:44   EEG adult  Result Date: 09/27/2023 Charlsie Quest, MD     09/27/2023  7:45 PM Patient Name: Bruce Little MRN: [id number] Epilepsy Attending: Charlsie Quest Referring Physician/Provider: Leatha Gilding, MD Date: 09/27/2023 Duration: 26.25 mins Patient history: 52 yo M with h/o seizure getting eeg to evaluate for seizure Level of alertness: Awake, asleep AEDs during EEG study: LEV Technical aspects: This EEG study was done with scalp electrodes positioned according to the 10-20 International system of electrode placement. Electrical activity was reviewed with band pass filter of 1-70Hz , sensitivity of 7 uV/mm, display speed of  76mm/sec with a 60Hz  notched filter applied as appropriate. EEG data were recorded continuously and digitally stored.  Video monitoring was available and reviewed as appropriate. Description: The posterior dominant rhythm consists of 8-9 Hz activity  of moderate voltage (25-35 uV) seen predominantly in posterior head regions, symmetric and reactive to eye opening and eye closing. Sleep was characterized by vertex waves, sleep spindles (12 to 14 Hz), maximal frontocentral region. EEG showed continuous polymorphic sharply contoured 3 to 6 Hz theta-delta slowing in right hemisphere. There was also overriding 12-14Hz  beta activity in right centro-parietal region consistent with breach artifact. Hyperventilation and photic stimulation were not performed.   ABNORMALITY - Breach artifact, right centro-parietal region - Continuous slow, right hemisphere IMPRESSION: This study is suggestive of cortical dysfunction arising from right centro-parietal region  consistent with underlying craniotomy. Additionally there is cortical dysfunction in right hemisphere likely secondary to underlying stroke. No seizures were seen throughout the recording. Priyanka Annabelle Harman   DG ABDOMEN PEG TUBE LOCATION  Result Date: 09/20/2023 CLINICAL DATA:  [id number] PEG (percutaneous endoscopic gastrostomy) adjustment/replacement/removal [facility]) [id number] EXAM: ABDOMEN - 1 VIEW COMPARISON:  CT abdomen pelvis 09/17/23, x-ray abdomen 08/02/2023 FINDINGS: Gastrostomy tube overlies the gastric lumen with PO contrast partially opacifying the gastric lumen. The bowel gas pattern is normal. No radio-opaque calculi or other significant radiographic abnormality are seen. IMPRESSION: Gastrostomy tube in appropriate position. Electronically Signed   By: Tish Frederickson M.D.   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 Current gastrostomy has been clogged for several weeks per patient's wife. Request for removal of existing pull through gastrostomy and placement of new balloon retention gastrostomy for ongoing nutritional needs. EXAM: BEDSIDE REMOVAL AND PLACEMENT OF NEW GASTROSTOMY TUBE WITHIN EXISTING TRACT COMPARISON:  None Available. MEDICATIONS: None. CONTRAST:  30 mL gastrografin - administered into the gastric lumen FLUOROSCOPY TIME:  None COMPLICATIONS: None immediate. PROCEDURE: Informed written consent was obtained from the patient's wife Taite Malin after a discussion of the risks, benefits and alternatives to treatment. Questions regarding the procedure were encouraged and answered. A timeout was performed prior to the initiation of the procedure. The upper abdomen and external portion of the existing gastrostomy tube was prepped and draped in the usual sterile fashion, and a sterile drape was applied covering the operative field. Maximum barrier sterile technique with sterile gowns and gloves were used for the procedure. A timeout was performed prior to the initiation of the procedure. The existing gastrostomy tube was removed in tact with gentle traction. Next, a 20 Fr balloon retention Entuit gastrostomy tube was lubricated with viscous lidocaine and placed within the existing tract. Immediate return of blood was noted within the lumen. The balloon was inflated with 20 mL of normal saline, pulled against the anterior inner lumen of the stomach and the external disc cinched to the skin. Both lumens flushed easily with 10 mL normal saline. KUB with gastrografin injection obtained post procedure which shows the gastrostomy tube to be within the gastric lumen. IMPRESSION: Successful removal of existing gastrostomy tube and placement of a new 20-French gastrostomy tube. The gastrostomy tube is ready for immediate use. Performed by Lynnette Caffey,  PA-C Electronically Signed   By: Marliss Coots M.D.   On: 09/20/2023 14:45   CT HEAD WO CONTRAST ( )  Result Date: 09/18/2023 CLINICAL DATA:  Stroke in June. Left-sided paralysis. Sent to ED by primary care. EXAM: CT HEAD WITHOUT CONTRAST TECHNIQUE: Contiguous axial images were obtained from the base of the skull through the vertex without intravenous contrast. RADIATION DOSE  REDUCTION: This exam was performed according to the departmental dose-optimization program which includes automated exposure control, adjustment of the mA and/or kV according to patient size and/or use of iterative reconstruction technique. COMPARISON:  MRI head 05/19/2021. FINDINGS: Brain: No intracranial hemorrhage, mass effect, or evidence of acute infarct. Extensive encephalomalacia in the right cerebral hemisphere compatible with chronic infarct. Ex vacuo dilatation ventricles. Basal cisterns are patent. No extra-axial fluid collection. Age-commensurate cerebral atrophy and chronic small vessel ischemic disease. Vascular: No hyperdense vessel. Intracranial arterial calcification. Skull: No fracture or focal lesion.  Right craniectomy. Sinuses/Orbits: Opacification of the few mastoid air cells bilaterally. Mucosal thickening left maxillary sinus. Other: None. IMPRESSION: 1. No acute intracranial abnormality. 2. Extensive chronic infarct in the right cerebral hemisphere. Electronically Signed   By: Minerva Fester M.D.   On: 09/18/2023 03:19   CT ABDOMEN PELVIS W CONTRAST  Result Date: 09/17/2023 CLINICAL DATA:  Nausea, vomiting, possible UTI.  GI tube clogged EXAM: CT ABDOMEN AND PELVIS WITH CONTRAST TECHNIQUE: Multidetector CT imaging of the abdomen and pelvis was performed using the standard protocol following bolus administration of intravenous contrast. RADIATION DOSE REDUCTION: This exam was performed according to the departmental dose-optimization program which includes automated exposure control, adjustment of the mA and/or  kV according to patient size and/or use of iterative reconstruction technique. CONTRAST:  75mL OMNIPAQUE IOHEXOL 350 MG/ML SOLN COMPARISON:  Renal ultrasound 08/04/2023 and radiographs 08/02/2023 FINDINGS: Lower chest: Small left-greater-than-right pleural effusions and associated atelectasis. Hepatobiliary: Mild distention of the gallbladder. Hyperdense layering sludge in the gallbladder. No biliary dilation. Liver is unremarkable. Pancreas: Unremarkable. Spleen: Unremarkable. Adrenals/Urinary Tract: Unremarkable adrenal glands. No urinary calculi or hydronephrosis. Foley catheter in the nondistended bladder. Stomach/Bowel: Normal caliber large and small bowel. Moderate colonic stool load. Normal appendix. Stomach is within normal limits. Expected location of the percutaneous gastrostomy tube in the stomach. Vascular/Lymphatic: Aortic atherosclerosis. No enlarged abdominal or pelvic lymph nodes. Reproductive: No acute abnormality. Other: Smaller free fluid in the pelvis. No free intraperitoneal air. Musculoskeletal: Resorption of the inferior sacrum and coccyx compatible with osteomyelitis. There is a large sacral decubitus ulcer measuring 7.4 x 6.6 x 3.9 cm and abutting the inferior portion of the remaining sacrum. Adjacent fat stranding and subcutaneous and presacral edema. No abscess. No soft tissue gas. IMPRESSION: 1. Large sacral decubitus ulcer with resorption of the inferior sacrum and coccyx. No abscess. 2. Small left-greater-than-right pleural effusions and associated atelectasis. 3. Expected location of the percutaneous gastrostomy tube in the stomach. 4. Foley catheter in the nondistended bladder. Aortic Atherosclerosis (ICD10-I70.0). Electronically Signed   By: Minerva Fester M.D.   On: 09/17/2023 20:35     Subjective: No acute issues or events overnight, tolerating dialysis well   Discharge Exam: Vitals:   10/04/23 0535 10/04/23 0743  BP: 113/66 99/65  Pulse: 77 77  Resp:  18  Temp: 98.3  F (36.8 C) 98.1 F (36.7 C)  SpO2: 97% 96%   Vitals:   10/04/23 0502 10/04/23 0507 10/04/23 0535 10/04/23 0743  BP: 109/70 100/63 113/66 99/65  Pulse: 73 75 77 77  Resp: 17 15  18   Temp:  98.2 F (36.8 C) 98.3 F (36.8 C) 98.1 F (36.7 C)  TempSrc:  Oral Oral   SpO2: 97% 97% 97% 96%  Weight:      Height:        General: Pt is alert, awake, not in acute distress Cardiovascular: RRR, S1/S2 +, no rubs, no gallops Respiratory: CTA bilaterally, no wheezing, no rhonchi  Abdominal: Soft, NT, ND, bowel sounds + Extremities: no edema, no cyanosis   The results of significant diagnostics from this hospitalization (including imaging, microbiology, ancillary and laboratory) are listed below for reference.     Microbiology: No results found for this or any previous visit (from the past 240 hour(s)).   Labs: BNP (last 3 results) No results for input(s): "BNP" in the last 8760 hours. Basic Metabolic Panel: Recent Labs  Lab 09/28/23 0535 09/30/23 0553 10/02/23 1403 10/03/23 0450  NA 131* 131* 135 132*  K 4.5 4.4 3.4* 4.1  CL 102 101 97* 96*  CO2 23 24 27 28   GLUCOSE 102* 133* 114* 118*  BUN 31* 35* 10 13  CREATININE 2.80* 2.81* 1.59* 2.22*  CALCIUM 8.2* 8.1* 8.2* 8.3*  MG 1.7  --   --   --   PHOS 3.7  --  1.9* 3.0   Liver Function Tests: Recent Labs  Lab 09/28/23 0535 10/02/23 1403  ALBUMIN 1.5* 2.0*   No results for input(s): "LIPASE", "AMYLASE" in the last 168 hours. No results for input(s): "AMMONIA" in the last 168 hours. CBC: Recent Labs  Lab 09/28/23 0535 09/30/23 0553 10/02/23 1403 10/03/23 0450  WBC 9.3 14.1* 14.7* 12.1*  HGB 7.6* 8.0* 9.2* 8.9*  HCT 22.5* 23.6* 28.0* 27.8*  MCV 94.9 95.9 96.2 97.5  PLT 253 297 328 364   Cardiac Enzymes: No results for input(s): "CKTOTAL", "CKMB", "CKMBINDEX", "TROPONINI" in the last 168 hours. BNP: Invalid input(s): "POCBNP" CBG: Recent Labs  Lab 10/03/23 0715 10/03/23 1112 10/03/23 1638 10/03/23 2018  10/04/23 0727  GLUCAP 113* 98 191* 255* 105*   D-Dimer No results for input(s): "DDIMER" in the last 72 hours. Hgb A1c No results for input(s): "HGBA1C" in the last 72 hours. Lipid Profile No results for input(s): "CHOL", "HDL", "LDLCALC", "TRIG", "CHOLHDL", "LDLDIRECT" in the last 72 hours. Thyroid function studies No results for input(s): "TSH", "T4TOTAL", "T3FREE", "THYROIDAB" in the last 72 hours.  Invalid input(s): "FREET3" Anemia work up No results for input(s): "VITAMINB12", "FOLATE", "FERRITIN", "TIBC", "IRON", "RETICCTPCT" in the last 72 hours. Urinalysis    Component Value Date/Time   COLORURINE YELLOW 09/17/2023 1004   APPEARANCEUR CLOUDY (A) 09/17/2023 1004   LABSPEC 1.025 09/17/2023 1004   PHURINE 8.0 09/17/2023 1004   GLUCOSEU 50 (A) 09/17/2023 1004   HGBUR MODERATE (A) 09/17/2023 1004   BILIRUBINUR NEGATIVE 09/17/2023 1004   KETONESUR NEGATIVE 09/17/2023 1004   PROTEINUR 100 (A) 09/17/2023 1004   NITRITE NEGATIVE 09/17/2023 1004   LEUKOCYTESUR LARGE (A) 09/17/2023 1004   Sepsis Labs Recent Labs  Lab 09/28/23 0535 09/30/23 0553 10/02/23 1403 10/03/23 0450  WBC 9.3 14.1* 14.7* 12.1*   Microbiology No results found for this or any previous visit (from the past 240 hour(s)).   Time coordinating discharge: Over 30 minutes  SIGNED:   [name], DO [facility] 10/04/2023, 8:30 AM Pager   If 7PM-7AM, please contact night-coverage [email or web address]

## 2023-10-04 NOTE — TOC Transition Note (Signed)
Transition of Care [facility]) - CM/SW Discharge Note   Patient Details  Name: Bruce Little MRN: [id number] Date of Birth: 06-Jan-1971  [facility]) CM/SW Contact:  Tom-Johnson, Hershal Coria, RN Phone Number: 10/04/2023, 2:29 PM   Clinical Narrative:     Patient is scheduled for discharge today.  Home health info, Outpatient f/u, hospital f/u and discharge instructions on AVS. Wound vac exchanged to [facility]/77M prior discharge. Wife, [name] to transport at discharge.  No further TOC needs noted.        Final next level of care: Home w Home Health Services Barriers to Discharge: Barriers Resolved   Patient Goals and CMS Choice CMS Medicare.gov Compare Post Acute Care list provided to:: Patient Choice offered to / list presented to : Patient, Spouse  Discharge Placement                  Patient to be transferred to facility by: Wife Name of family member notified: [facility] and Services Additional resources added to the After Visit Summary for                  DME Arranged: Other see comment Belva Chimes from [facility]/77M) DME Agency: [facility] Date DME Agency Contacted: 09/29/23 Time DME Agency Contacted: [phone] Representative spoke with at DME Agency: Blossom Hoops HH Arranged: PT, OT, RN, Speech Therapy, Nurse's Aide, Social Work [facility] Agency: [facility] Date [facility] Agency Contacted: 09/22/23 Time HH Agency Contacted: [phone] Representative spoke with at [facility] Agency: Haywood Lasso  Social Determinants of Health (SDOH) Interventions SDOH Screenings   Food Insecurity: No Food Insecurity (09/17/2023)  Housing: Low Risk  (09/17/2023)  Transportation Needs: No Transportation Needs (09/17/2023)  Utilities: Not At Risk (09/17/2023)  Depression (PHQ2-9): Low Risk  (08/27/2021)  Financial Resource Strain: Low Risk  (08/10/2023)   Received from [facility]  Social Connections: Unknown (08/13/2023)   Received from [facility]  Stress: No Stress Concern Present  (08/10/2023)   Received from [facility]  Tobacco Use: Low Risk  (09/20/2023)     Readmission Risk Interventions    09/22/2023   11:18 AM 07/11/2021    3:04 PM  Readmission Risk Prevention Plan  Transportation Screening Complete Complete  PCP or Specialist Appt within 5-7 Days Complete Complete  Home Care Screening Complete Complete  Medication Review (RN CM) Referral to Pharmacy Complete

## 2023-10-04 NOTE — Consult Note (Addendum)
Reason for Consult: Surgical team following for assessment and plan of care.   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  The seal is working, but because the area is so close to the anus, and we could see a soft stool surrounding, check if there some leaking. Wound Bed: 80% Red, 20% Yellow. Maceration on peri-wound skin close to the anus.   Removed old NPWT dressing Cleansed wound with normal saline Periwound skin protected with skin barrier wipe or window framed with drape Skin protected to the hip with VAC drape for foam bridge  Filled wound with 1 piece of black foam, and a barrier ring at the bottom to make a better seal. Sealed NPWT dressing at  No pain medication was needed.   He was prepare to go home with the [facility] home portable machine.     Buttocks wound on the left side: Pressure Injury POA: Yes Wound bed: Yellow covering 80%, 20% red Measurement: 3x3cm. Dressing procedure/placement/frequency: Medihoney, change everyday, and a foam dressing covering, change every 3 days or if is saturated.    Thank-you,  Denyse Amass BSN, RN, [employer], WOC  (Pager: [phone])

## 2023-10-18 ENCOUNTER — Encounter: Payer: Self-pay | Admitting: Neurology

## 2023-10-18 ENCOUNTER — Ambulatory Visit: Payer: BC Managed Care – PPO | Admitting: Neurology

## 2023-10-18 VITALS — BP 138/89 | HR 80 | Ht 74.0 in | Wt 240.0 lb

## 2023-10-18 DIAGNOSIS — I611 Nontraumatic intracerebral hemorrhage in hemisphere, cortical: Secondary | ICD-10-CM

## 2023-10-18 DIAGNOSIS — Z9889 Other specified postprocedural states: Secondary | ICD-10-CM | POA: Diagnosis not present

## 2023-10-18 DIAGNOSIS — G8112 Spastic hemiplegia affecting left dominant side: Secondary | ICD-10-CM

## 2023-10-18 DIAGNOSIS — G40009 Localization-related (focal) (partial) idiopathic epilepsy and epileptic syndromes with seizures of localized onset, not intractable, without status epilepticus: Secondary | ICD-10-CM | POA: Diagnosis not present

## 2023-10-18 NOTE — Patient Instructions (Signed)
I had a long d/w patient about his recent hemorrhagic stroke, decompressive hemicraniectomy and residual spastic left hemiplegia risk for recurrent stroke/TIAs, personally independently reviewed imaging studies and stroke evaluation results and answered questions.recommend aspirin 81 mg daily   for secondary stroke prevention and maintain strict control of hypertension with blood pressure goal below 130/90, diabetes with hemoglobin A1c goal below 6.5% and lipids with LDL cholesterol goal below 70 mg/dL. I also advised the patient to eat a healthy diet with plenty of whole grains, cereals, fruits and vegetables, exercise regularly and maintain ideal body weight .continue ongoing physical occupational and speech therapy.  Continue Keppra in the current dose of 1.5 g twice daily for seizure prophylaxis.  Followup in the future with Shanda Bumps, nurse practitioner in 6 months or call earlier if necessary

## 2023-10-18 NOTE — Progress Notes (Signed)
[facility] [address] [place]. [place] [address] [phone]       STROKE FOLLOW UP NOTE  Bruce Little Date of Birth:  09-26-1971 Medical Record Number:  [id number]   Reason for Referral: stroke follow up    CHIEF COMPLAINT:  Stroke follow-up   HPI:  Update 10/18/2023 : Patient is seen for follow-up today after last visit with Bruce Little nurse practitioner year and half ago.  Patient was visiting [place] for her teachers conference when his wife did not hear from him for a day.  She could track him in the hotel room though he was supposed to be at the children's game.  She called the hotel and when there was no response from inside the room EMS was called.  Patient was admitted at [facility] in [place] ,[place] from 04/30/2023 to 07/01/2023.  I have reviewed the patient's hospital course on electronic medical records which the patient's wife was able to share with me on her phone.  Patient presented with altered mental status with vomiting and was found to be significantly hypertensive in the emergency room with a blood pressure 226/100.  Noncontrast CT head showed a large 9.2 x 5.5 cm right frontotemporal parenchymal hematoma with cytotoxic edema centimeter leftward midline shift.  He was taken for emergency hemicraniectomy with hematoma evacuation and EVD placement.  Hospital course was complicated by Staph epidermidis in the CSF on 05/26/2023 with repeat growth on 05/31/2023 and 06/02/2023.  He was treated with antibiotics and eventually EVD was removed.  There was debridement done of the flap and the drain was placed.  Patient also developed sacral decubitus ulcer and underwent serial debridements and sacral wounds grew E. coli, Morganella and Enterococcus faecalis he was treated with antibiotics for several weeks with this.  Hospital course was also complicated by C. difficile diarrhea which was treated with a full course of IV vancomycin.   Patient had a tracheostomy and a PEG tube in he initially developed renal failure requiring dialysis.  [facility] [place] [facility] from 07/01/2023 to 08/10/2023 tracheostomy was decannulated and he was weaned to room air.  He was seen by speech therapy and started on a p.o. diet.  [facility] for another month.  She subsequently went home but developed a UTI and was admitted to [facility] for 2 weeks from 09/17/2023 to 10/04/2023.  He had a focal seizure there was started on Keppra.  EEG on 09/27/2023 showed cortical dysfunction arising from the right central parietal region but no seizures were noted during the recording.  Patient has since now been home for 1 week.  He is started home physical occupational and speech therapy.  He still bedbound.  Any attempts to move him result in nausea and hypertension.  Patient has dense left hemiplegia.  Is able to move his right arm purposefully but right leg has some weakness though he can move it off the bed.  He does have a indwelling Foley catheter and is having several bouts of UTIs and is presently on antibiotic with amoxicillin. Update 02/23/2022 JM: Patient returns for stroke follow-up after prior visit 7 months ago.  Stable from stroke standpoint without new stroke/TIA symptoms.  Remains on Eliquis 5 mg twice daily and atorvastatin, denies side effects.  Blood pressure routinely monitored at home which has been stable - typically 120s/80s. Closely follows with PCP and  cardiology. Reports continued weight loss with current weight 195 pounds down from 260 lbs. 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 Roda Little.  He has been slowly trying to work with prosthetic, closely followed by orthopedics Dr. Roda Little. 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  No further concerns no further concerns at this time    History provided for reference purposes only Initial visit 08/04/2021 JM: [name] is being seen for hospital follow-up accompanied by his wife, Bruce Little.  Overall stable from stroke standpoint.  Denies new or reoccurring stroke/TIA symptoms.  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 Post op, developed A. fib and placed on Eliquis 5 mg twice daily. He has been gradually recovering, remains nonweightbearing and use of wound VAC -routinely followed by infectious disease, orthopedics and wound care.  He has remained on Eliquis 5 mg twice daily as well as atorvastatin and fenofibrate tolerating without side effects.  Blood pressure today 157/97. Routinely monitors at home which has been fluctuating based on pain and activity levels. Glucose levels monitored at home and typically 100-130.  He is questioning undergoing retina surgery previously scheduled on 8/9 but in setting of stroke, this was postponed until follow up with our office (initially scheduled f/u on 8/31 but rescheduled as he was hospitalized).  No further concerns at this time.  Stroke admission 05/19/2021 [name] is a 52 y.o. male with history of DM2, HTN, who presented on 05/19/2021 with Left sided weakness and numbness since 7/9.  Personally reviewed hospitalization pertinent progress notes, lab work and imaging.  Evaluated by Dr. 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  Consult evidence of old right thalamus infarct and right BG ICH.  MRA head mild stenosis proximal L P2 segment.  Carotid Doppler right ICA 40 to 59% stenosis in left ICA 1 to 39% stenosis. Was not felt to symptomatic but did recommend follow-up with VVS OP.  EF 60 to 65%.  Direct LDL 122.9 and TG 456-initiated  atorvastatin 80 mg daily and fenofibrate 160 mg daily.  A1c 7.4. He was found to have severe elevated creatinine level as well as significantly elevated BP.  Evaluated by nephrology with plans on completing renal biopsy OP.  Recommended DAPT for 3 weeks then Plavix alone as on aspirin PTA.  Evaluated by therapies without therapy needs and discharged home.      PERTINENT IMAGING  MR BRAIN 05/19/2021 MR ANGIO HEAD IMPRESSION: 1. Small acute/early subacute infarct of the right caudate body. No hemorrhage or mass effect. 2. 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 3. Mild stenosis of the proximal left P2 segment.  VAS US CAROTID DUPLEX 05/20/2021 Summary:  Right Carotid: Velocities in the right ICA are consistent with a 40-59% stenosis.  Left Carotid: Velocities in the left ICA are consistent with a 1-39% stenosis.  Vertebrals: Bilateral vertebral arteries demonstrate antegrade flow.   ECHO 05/20/2021 IMPRESSIONS   1. Left ventricular ejection fraction, by estimation, is 60 to 65%. The  left ventricle has normal function. The left ventricle has no regional  wall motion abnormalities. There is mild concentric left ventricular  hypertrophy. Left ventricular diastolic  function could not be evaluated.  2. Right ventricular systolic function is normal. The right ventricular  size is normal. Tricuspid regurgitation signal is inadequate for assessing  PA pressure.   3. Left atrial size was mildly dilated.   4. The mitral valve is normal in structure. Trivial mitral valve  regurgitation. No evidence of mitral stenosis.   5. The aortic valve is normal in structure. Aortic valve regurgitation is  not visualized. No aortic stenosis is present.   6. The inferior vena cava is normal in size with <50% respiratory  variability, suggesting right atrial pressure of 8 mmHg.     ROS:   14 system review of systems performed and negative with exception of those listed in HPI  PMH:   Past Medical History:  Diagnosis Date   A-fib (HCC)    Anemia    low iron   Chronic kidney disease    COVID    has had it 2 times, one mild and one wasn't   DM2 (diabetes mellitus, type 2) (HCC)    History of blood transfusion    HTN (hypertension)    Osteomyelitis of fifth toe of left foot (HCC) 08/13/2021   Osteomyelitis of fourth toe of left foot (HCC) 08/13/2021   Pneumonia    Stroke (HCC) 05/19/2021   no residual effects.   Stroke [facility])     PSH:  Past Surgical History:  Procedure Laterality Date   AMPUTATION Left 08/29/2021   Procedure: AMPUTATION OF FOURTH TOE AND RAY ALONG WITH REMAINING FITH METATARSAL;  Surgeon: Tarry Kos, MD;  Location: [facility] OR;  Service: Orthopedics;  Laterality: Left;   AMPUTATION Left 09/03/2021   Procedure: LISFRANC AMPUTATION;  Surgeon: Tarry Kos, MD;  Location: [facility] OR;  Service: Orthopedics;  Laterality: Left;   AMPUTATION Left 10/29/2021   Procedure: AMPUTATION BELOW KNEE -LEFT;  Surgeon: Tarry Kos, MD;  Location: [facility] OR;  Service: Orthopedics;  Laterality: Left;   APPLICATION OF WOUND VAC Left 07/07/2021   Procedure: APPLICATION OF WOUND VAC;  Surgeon: Tarry Kos, MD;  Location: [facility] OR;  Service: Orthopedics;  Laterality: Left;   APPLICATION OF WOUND VAC Left 08/29/2021   Procedure: APPLICATION OF WOUND VAC;  Surgeon: Tarry Kos, MD;  Location: [facility] OR;  Service: Orthopedics;  Laterality: Left;   I & D EXTREMITY Left 07/03/2021   Procedure: IRRIGATION AND DEBRIDEMENT ,FIFTH RAY  AMPUTATION LEFT FOOT, , WOUND VAC PLACEMENT;  Surgeon: Tarry Kos, MD;  Location: [facility] OR;  Service: Orthopedics;  Laterality: Left;   I & D EXTREMITY Left 07/07/2021   Procedure: IRRIGATION AND DEBRIDEMENT LEFT FOOT;  Surgeon: Tarry Kos, MD;  Location: [facility] OR;  Service: Orthopedics;  Laterality: Left;   I & D EXTREMITY Left 08/29/2021   Procedure: IRRIGATION AND DEBRIDEMENT LEFT FOOT;  Surgeon: Tarry Kos, MD;  Location: [facility] OR;  Service: Orthopedics;   Laterality: Left;   IR FLUORO GUIDE CV LINE RIGHT  07/09/2021   IR REMOVAL TUN CV CATH W/O FL  10/08/2021   IR REMOVAL TUN CV CATH W/O FL  07/13/2023   IR REPLACE G-TUBE SIMPLE WO FLUORO  09/20/2023   IR US GUIDE VASC ACCESS RIGHT  07/09/2021   VITRECTOMY Left    [place] [facility]    Social History:  Social History   Socioeconomic History   Marital status: Married    Spouse name: Bruce Little   Number of children: Not on file   Years of education: Not on file   Highest education level:  Not on file  Occupational History   Not on file  Tobacco Use   Smoking status: Never   Smokeless tobacco: Never  Vaping Use   Vaping status: Never Used  Substance and Sexual Activity   Alcohol use: Not Currently    Comment: rare   Drug use: Never   Sexual activity: Not on file  Other Topics Concern   Not on file  Social History Narrative   Not on file   Social Determinants of Health   Financial Resource Strain: Low Risk  (08/10/2023)   Received from [facility]   Overall Financial Resource Strain (CARDIA)    Difficulty of Paying Living Expenses: Not hard at all  Food Insecurity: No Food Insecurity (09/17/2023)   Hunger Vital Sign    Worried About Running Out of Food in the Last Year: Never true    Ran Out of Food in the Last Year: Never true  Transportation Needs: No Transportation Needs (09/17/2023)   PRAPARE - Administrator, Civil Service (Medical): No    Lack of Transportation (Non-Medical): No  Physical Activity: Not on file  Stress: No Stress Concern Present (08/10/2023)   Received from [facility]   [employer] of Occupational Health - Occupational Stress Questionnaire    Feeling of Stress : Not at all  Social Connections: Unknown (08/13/2023)   Received from [facility]   Social Network    Social Network: Not on file  Intimate Partner Violence: Not At Risk (09/17/2023)   Humiliation, Afraid, Rape, and Kick questionnaire    Fear of Current or Ex-Partner: No     Emotionally Abused: No    Physically Abused: No    Sexually Abused: No    Family History:  Family History  Problem Relation Age of Onset   Stroke Mother    Cancer Mother    Heart disease Father     Medications:   Current Outpatient Medications on File Prior to Visit  Medication Sig Dispense Refill   albuterol (VENTOLIN HFA) 108 (90 Base) MCG/ACT inhaler Inhale 2 puffs into the lungs every 4 (four) hours as needed.     amiodarone (PACERONE) 100 MG tablet Take 100 mg by mouth daily.     amLODipine (NORVASC) 10 MG tablet Take 10 mg by mouth daily.     ascorbic acid (VITAMIN C) 500 MG tablet Take 1 tablet (500 mg total) by mouth 2 (two) times daily. 60 tablet 0   atorvastatin (LIPITOR) 40 MG tablet Take 40 mg by mouth at bedtime.     carvedilol (COREG) 25 MG tablet Take 1 tablet (25 mg total) by mouth 2 (two) times daily with a meal. 60 tablet 0   cloNIDine (CATAPRES) 0.1 MG tablet Take 1 tablet (0.1 mg total) by mouth daily. 30 tablet 0   feeding supplement (ENSURE ENLIVE / ENSURE PLUS) LIQD Take 237 mLs by mouth 2 (two) times daily between meals. 237 mL 12   finasteride (PROSCAR) 5 MG tablet Take 5 mg by mouth daily.     food thickener (SIMPLYTHICK, NECTAR/LEVEL 2/MILDLY THICK,) GEL Take 1 packet by mouth as needed. 200 packet 0   furosemide (LASIX) 40 MG tablet Take 40 mg by mouth daily.     hydrALAZINE (APRESOLINE) 25 MG tablet Take 25 mg by mouth every 8 (eight) hours.     LANTUS SOLOSTAR 100 UNIT/ML Solostar Pen Inject 5 Units into the skin daily. 15 mL 0   leptospermum manuka honey (MEDIHONEY) PSTE paste Apply 1  Application topically daily. 180 mL 0   levETIRAcetam (KEPPRA) 750 MG tablet Take 1,500 mg by mouth 2 (two) times daily.     losartan (COZAAR) 50 MG tablet Take 1 tablet (50 mg total) by mouth daily. 30 tablet 0   Melatonin 10 MG TABS Take 1 tablet by mouth at bedtime.     methocarbamol (ROBAXIN) 500 MG tablet Take 1 tablet (500 mg total) by mouth every 8 (eight) hours as  needed for muscle spasms. 30 tablet 0   multivitamin (RENA-VIT) TABS tablet Take 1 tablet by mouth at bedtime. 30 tablet 0   nutrition supplement, JUVEN, (JUVEN) PACK Take 1 packet by mouth 2 (two) times daily between meals. 60 packet 0   nystatin (MYCOSTATIN) 100000 UNIT/ML suspension Take 5 mLs by mouth 4 (four) times daily.     ondansetron (ZOFRAN) 4 MG tablet Take 1 tablet (4 mg total) by mouth every 8 (eight) hours as needed for nausea or vomiting. 40 tablet 0   ondansetron (ZOFRAN-ODT) 4 MG disintegrating tablet Take 4 mg by mouth every 4 (four) hours as needed.     pantoprazole (PROTONIX) 40 MG tablet Take 1 tablet (40 mg total) by mouth daily. (Patient taking differently: Take 40 mg by mouth 2 (two) times daily.) 30 tablet 0   PARoxetine (PAXIL) 20 MG tablet Take 10 mg by mouth daily.     terazosin (HYTRIN) 1 MG capsule Take 1 mg by mouth at bedtime.     Vitamin D, Ergocalciferol, (DRISDOL) 1.25 MG (50000 UNIT) CAPS capsule Take 1 capsule (50,000 Units total) by mouth every 7 (seven) days. 4 capsule 0   glipiZIDE (GLUCOTROL) 5 MG tablet Take 1 tablet (5 mg total) by mouth daily. 30 tablet 0   No current facility-administered medications on file prior to visit.    Allergies:  No Known Allergies    OBJECTIVE:  Physical Exam General: pleasant middle-age Caucasian male, seated, in no evident distress.  Status post right hemicraniectomy bone defect on his skull and wearing a helmet.  He does not have Head: head normocephalic and atraumatic.    Neurologic Exam Mental Status: Awake and fully alert.  Disoriented to place and time. Recent and remote memory diminished attention span, concentration and fund of knowledge poor e. Mood and affect appropriate.  Follows 1 and few two-step commands. Cranial Nerves: Right gaze deviation.  Able to look to the left past midline.  Left homonymous hemianopsia extraocular movements full without nystagmus. Hearing intact to voice. Facial sensation intact.   Mild right lower facial weakness.  Tongue, palate moves normally and symmetrically.  Shoulder shrug symmetric. Motor: Dense spastic left hemiplegia 0/5 strength.  Right upper extremity strength is normal.  Right lower extremity strength is 3-4/5 with drift. Diminished sensation in the left hemibody from lower face down.  Normal sensation in the right. Coordination normal on the right unable to test on the left. Gait patient unable to walk  NIH stroke scale 15 Modified Rankin scale score 4      ASSESSMENT: Bruce Little is a 52 y.o. 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 Vascular risk factors include HTN, HLD, DM, prior strokes on imaging and R>L carotid stenosis.  Hospitalized in August for sepsis in setting of left lower necrotizing infection and postop PAF with RVR. S/p L BKA 10/2021.  Recent hospitalization in [place] from June to August 2024 for large right  frontoparietal parenchymal hemorrhage due to hypertension and anticoagulation status post decompressive hemicraniectomy with a prolonged hospital course complicated by meningitis ,seizures, sacral ulcers, C. difficile colitis and UTI.  He has significant residual spastic left hemiplegia    PLAN:  I had a long d/w patient about his recent hemorrhagic stroke, decompressive hemicraniectomy and residual spastic left hemiplegia risk for recurrent stroke/TIAs, personally independently reviewed imaging studies and stroke evaluation results and answered questions.recommend aspirin 81 mg daily   for secondary stroke prevention and maintain strict control of hypertension with blood pressure goal below 130/90, diabetes with hemoglobin A1c goal below 6.5% and lipids with LDL cholesterol goal below 70 mg/dL. I also advised the patient to eat a healthy diet with plenty of whole grains, cereals, fruits and vegetables, exercise regularly  and maintain ideal body weight .continue ongoing physical occupational and speech therapy.  Continue Keppra in the current dose of 1.5 g twice daily for seizure prophylaxis.  Followup in the future with Bruce Little, nurse practitioner in 6 months or call earlier if necessary      CC:  PCP: Street, Stephanie Coup, MD    I spent 50 minutes of face-to-face and non-face-to-face time with patient and his wife with extensive review of electronic medical records from his recent hospitalization for intracerebral hemorrhage in [place] and this included previsit chart review, lab review, study review, electronic health record documentation, patient education regarding prior stroke, secondary stroke prevention measures and importance of managing stroke risk factors,  and answered all other questions to patients satisfaction  Delia Heady, MD  [facility] [address] [place], [place] [address]  Phone [phone] Fax [phone] Note: This document was prepared with digital dictation and possible smart phrase technology. Any transcriptional errors that result from this process are unintentional.

## 2023-11-01 ENCOUNTER — Inpatient Hospital Stay (HOSPITAL_COMMUNITY)
Admission: EM | Admit: 2023-11-01 | Discharge: 2023-11-03 | DRG: 177 | Disposition: A | Discharge status: Home Health | Payer: BC Managed Care – PPO | Attending: Internal Medicine | Admitting: Internal Medicine

## 2023-11-01 ENCOUNTER — Other Ambulatory Visit: Payer: Self-pay

## 2023-11-01 ENCOUNTER — Emergency Department (HOSPITAL_COMMUNITY): Payer: BC Managed Care – PPO

## 2023-11-01 ENCOUNTER — Ambulatory Visit (HOSPITAL_BASED_OUTPATIENT_CLINIC_OR_DEPARTMENT_OTHER): Payer: BC Managed Care – PPO | Admitting: Internal Medicine

## 2023-11-01 DIAGNOSIS — N250 Renal osteodystrophy: Secondary | ICD-10-CM | POA: Diagnosis present

## 2023-11-01 DIAGNOSIS — Z960 Presence of urogenital implants: Secondary | ICD-10-CM | POA: Diagnosis present

## 2023-11-01 DIAGNOSIS — Z7984 Long term (current) use of oral hypoglycemic drugs: Secondary | ICD-10-CM | POA: Diagnosis not present

## 2023-11-01 DIAGNOSIS — E785 Hyperlipidemia, unspecified: Secondary | ICD-10-CM | POA: Diagnosis present

## 2023-11-01 DIAGNOSIS — E876 Hypokalemia: Secondary | ICD-10-CM | POA: Diagnosis present

## 2023-11-01 DIAGNOSIS — R112 Nausea with vomiting, unspecified: Secondary | ICD-10-CM | POA: Diagnosis present

## 2023-11-01 DIAGNOSIS — I120 Hypertensive chronic kidney disease with stage 5 chronic kidney disease or end stage renal disease: Secondary | ICD-10-CM | POA: Diagnosis present

## 2023-11-01 DIAGNOSIS — Z823 Family history of stroke: Secondary | ICD-10-CM

## 2023-11-01 DIAGNOSIS — Z89512 Acquired absence of left leg below knee: Secondary | ICD-10-CM

## 2023-11-01 DIAGNOSIS — Z2239 Carrier of other specified bacterial diseases: Secondary | ICD-10-CM

## 2023-11-01 DIAGNOSIS — E440 Moderate protein-calorie malnutrition: Secondary | ICD-10-CM | POA: Diagnosis present

## 2023-11-01 DIAGNOSIS — Z6828 Body mass index (BMI) 28.0-28.9, adult: Secondary | ICD-10-CM

## 2023-11-01 DIAGNOSIS — E119 Type 2 diabetes mellitus without complications: Secondary | ICD-10-CM | POA: Diagnosis not present

## 2023-11-01 DIAGNOSIS — I69391 Dysphagia following cerebral infarction: Secondary | ICD-10-CM

## 2023-11-01 DIAGNOSIS — R110 Nausea: Secondary | ICD-10-CM | POA: Diagnosis not present

## 2023-11-01 DIAGNOSIS — Z794 Long term (current) use of insulin: Secondary | ICD-10-CM | POA: Diagnosis not present

## 2023-11-01 DIAGNOSIS — N186 End stage renal disease: Secondary | ICD-10-CM | POA: Diagnosis present

## 2023-11-01 DIAGNOSIS — E1122 Type 2 diabetes mellitus with diabetic chronic kidney disease: Secondary | ICD-10-CM | POA: Diagnosis present

## 2023-11-01 DIAGNOSIS — Z87440 Personal history of urinary (tract) infections: Secondary | ICD-10-CM

## 2023-11-01 DIAGNOSIS — L89320 Pressure ulcer of left buttock, unstageable: Secondary | ICD-10-CM | POA: Diagnosis present

## 2023-11-01 DIAGNOSIS — I4891 Unspecified atrial fibrillation: Secondary | ICD-10-CM | POA: Diagnosis present

## 2023-11-01 DIAGNOSIS — R1312 Dysphagia, oropharyngeal phase: Secondary | ICD-10-CM | POA: Diagnosis present

## 2023-11-01 DIAGNOSIS — Z992 Dependence on renal dialysis: Secondary | ICD-10-CM

## 2023-11-01 DIAGNOSIS — D631 Anemia in chronic kidney disease: Secondary | ICD-10-CM | POA: Diagnosis present

## 2023-11-01 DIAGNOSIS — L89154 Pressure ulcer of sacral region, stage 4: Secondary | ICD-10-CM | POA: Diagnosis present

## 2023-11-01 DIAGNOSIS — K5900 Constipation, unspecified: Secondary | ICD-10-CM | POA: Diagnosis present

## 2023-11-01 DIAGNOSIS — Z8249 Family history of ischemic heart disease and other diseases of the circulatory system: Secondary | ICD-10-CM

## 2023-11-01 DIAGNOSIS — Z8701 Personal history of pneumonia (recurrent): Secondary | ICD-10-CM

## 2023-11-01 DIAGNOSIS — Z931 Gastrostomy status: Secondary | ICD-10-CM

## 2023-11-01 DIAGNOSIS — N2581 Secondary hyperparathyroidism of renal origin: Secondary | ICD-10-CM | POA: Diagnosis present

## 2023-11-01 DIAGNOSIS — I69351 Hemiplegia and hemiparesis following cerebral infarction affecting right dominant side: Secondary | ICD-10-CM

## 2023-11-01 DIAGNOSIS — U071 COVID-19: Secondary | ICD-10-CM | POA: Diagnosis present

## 2023-11-01 DIAGNOSIS — Z7401 Bed confinement status: Secondary | ICD-10-CM

## 2023-11-01 DIAGNOSIS — Z79899 Other long term (current) drug therapy: Secondary | ICD-10-CM

## 2023-11-01 LAB — COMPREHENSIVE METABOLIC PANEL
ALT: 28 U/L (ref 0–44)
AST: 27 U/L (ref 15–41)
Albumin: 2 g/dL — ABNORMAL LOW (ref 3.5–5.0)
Alkaline Phosphatase: 120 U/L (ref 38–126)
Anion gap: 17 — ABNORMAL HIGH (ref 5–15)
BUN: 18 mg/dL (ref 6–20)
CO2: 27 mmol/L (ref 22–32)
Calcium: 8.6 mg/dL — ABNORMAL LOW (ref 8.9–10.3)
Chloride: 90 mmol/L — ABNORMAL LOW (ref 98–111)
Creatinine, Ser: 3.29 mg/dL — ABNORMAL HIGH (ref 0.61–1.24)
GFR, Estimated: 22 mL/min — ABNORMAL LOW (ref >60)
Glucose, Bld: 122 mg/dL — ABNORMAL HIGH (ref 70–99)
Potassium: 3.4 mmol/L — ABNORMAL LOW (ref 3.5–5.1)
Sodium: 134 mmol/L — ABNORMAL LOW (ref 135–145)
Total Bilirubin: 0.7 mg/dL (ref <1.2)
Total Protein: 6.7 g/dL (ref 6.5–8.1)

## 2023-11-01 LAB — URINALYSIS, ROUTINE W REFLEX MICROSCOPIC
Bilirubin Urine: NEGATIVE
Glucose, UA: NEGATIVE mg/dL
Hgb urine dipstick: NEGATIVE
Ketones, ur: NEGATIVE mg/dL
Nitrite: NEGATIVE
Protein, ur: >=300 mg/dL — AB
Specific Gravity, Urine: 1.016 (ref 1.005–1.030)
WBC, UA: >50 WBC/hpf (ref 0–5)
pH: 6 (ref 5.0–8.0)

## 2023-11-01 LAB — CBC WITH DIFFERENTIAL/PLATELET
Abs Immature Granulocytes: 0.11 10*3/uL — ABNORMAL HIGH (ref 0.00–0.07)
Basophils Absolute: 0 10*3/uL (ref 0.0–0.1)
Basophils Relative: 0 %
Eosinophils Absolute: 0 10*3/uL (ref 0.0–0.5)
Eosinophils Relative: 0 %
HCT: 30.6 % — ABNORMAL LOW (ref 39.0–52.0)
Hemoglobin: 10 g/dL — ABNORMAL LOW (ref 13.0–17.0)
Immature Granulocytes: 1 %
Lymphocytes Relative: 7 %
Lymphs Abs: 1.1 10*3/uL (ref 0.7–4.0)
MCH: 31.9 pg (ref 26.0–34.0)
MCHC: 32.7 g/dL (ref 30.0–36.0)
MCV: 97.8 fL (ref 80.0–100.0)
Monocytes Absolute: 0.7 10*3/uL (ref 0.1–1.0)
Monocytes Relative: 4 %
Neutro Abs: 14.3 10*3/uL — ABNORMAL HIGH (ref 1.7–7.7)
Neutrophils Relative %: 88 %
Platelets: 408 10*3/uL — ABNORMAL HIGH (ref 150–400)
RBC: 3.13 MIL/uL — ABNORMAL LOW (ref 4.22–5.81)
RDW: 15.3 % (ref 11.5–15.5)
WBC: 16.2 10*3/uL — ABNORMAL HIGH (ref 4.0–10.5)
nRBC: 0 % (ref 0.0–0.2)

## 2023-11-01 LAB — C-REACTIVE PROTEIN: CRP: 15.3 mg/dL — ABNORMAL HIGH (ref <1.0)

## 2023-11-01 LAB — TROPONIN I (HIGH SENSITIVITY): Troponin I (High Sensitivity): 16 ng/L (ref <18)

## 2023-11-01 LAB — RESP PANEL BY RT-PCR (RSV, FLU A&B, COVID)  RVPGX2
Influenza A by PCR: NEGATIVE
Influenza B by PCR: NEGATIVE
Resp Syncytial Virus by PCR: NEGATIVE
SARS Coronavirus 2 by RT PCR: POSITIVE — AB

## 2023-11-01 LAB — GLUCOSE, CAPILLARY
Glucose-Capillary: 94 mg/dL (ref 70–99)
Glucose-Capillary: 105 mg/dL — ABNORMAL HIGH (ref 70–99)
Glucose-Capillary: 82 mg/dL (ref 70–99)

## 2023-11-01 LAB — HEPATITIS B SURFACE ANTIGEN: Hepatitis B Surface Ag: NONREACTIVE

## 2023-11-01 LAB — D-DIMER, QUANTITATIVE: D-Dimer, Quant: 1.47 ug{FEU}/mL — ABNORMAL HIGH (ref 0.00–0.50)

## 2023-11-01 LAB — LACTIC ACID, PLASMA: Lactic Acid, Venous: 0.8 mmol/L (ref 0.5–1.9)

## 2023-11-01 LAB — LIPASE, BLOOD: Lipase: 49 U/L (ref 11–51)

## 2023-11-01 MED ORDER — INSULIN ASPART 100 UNIT/ML IJ SOLN: 0.0000 [IU] | Freq: Every day | INTRAMUSCULAR | Status: DC

## 2023-11-01 MED ORDER — RENA-VITE PO TABS
1.0000 | ORAL_TABLET | Freq: Every day | ORAL | Status: DC
Start: 2023-11-01 — End: 2023-11-03
  Administered 2023-11-01 – 2023-11-02 (×2): 1 via ORAL
  Filled 2023-11-01 (×2): qty 1

## 2023-11-01 MED ORDER — CHLORHEXIDINE GLUCONATE CLOTH 2 % EX PADS: 6.0000 | MEDICATED_PAD | Freq: Every day | CUTANEOUS | Status: DC

## 2023-11-01 MED ORDER — METHOCARBAMOL 1000 MG/10ML IJ SOLN: 500.0000 mg | Freq: Three times a day (TID) | INTRAMUSCULAR | Status: DC | PRN

## 2023-11-01 MED ORDER — HYDRALAZINE HCL 20 MG/ML IJ SOLN: 10.0000 mg | Freq: Four times a day (QID) | INTRAMUSCULAR | Status: DC | PRN

## 2023-11-01 MED ORDER — ZINC SULFATE 220 (50 ZN) MG PO CAPS
220.0000 mg | ORAL_CAPSULE | Freq: Every day | ORAL | Status: DC
  Administered 2023-11-02 – 2023-11-03 (×2): 220 mg via ORAL
  Filled 2023-11-01 (×2): qty 1

## 2023-11-01 MED ORDER — SODIUM CHLORIDE 0.9% FLUSH
3.0000 mL | Freq: Two times a day (BID) | INTRAVENOUS | Status: DC
  Administered 2023-11-01 – 2023-11-03 (×5): 3 mL via INTRAVENOUS

## 2023-11-01 MED ORDER — ACETAMINOPHEN 325 MG PO TABS
650.0000 mg | ORAL_TABLET | Freq: Four times a day (QID) | ORAL | Status: DC | PRN
  Administered 2023-11-02: 650 mg via ORAL
  Filled 2023-11-01 (×2): qty 2

## 2023-11-01 MED ORDER — FINASTERIDE 5 MG PO TABS
5.0000 mg | ORAL_TABLET | Freq: Every day | ORAL | Status: DC
  Administered 2023-11-01 – 2023-11-03 (×3): 5 mg via ORAL
  Filled 2023-11-01 (×3): qty 1

## 2023-11-01 MED ORDER — HYDRALAZINE HCL 25 MG PO TABS
25.0000 mg | ORAL_TABLET | Freq: Three times a day (TID) | ORAL | Status: DC
  Administered 2023-11-01 – 2023-11-03 (×5): 25 mg via ORAL
  Filled 2023-11-01 (×5): qty 1

## 2023-11-01 MED ORDER — SODIUM CHLORIDE 0.9 % IV SOLN: 500.0000 mg | INTRAVENOUS | Status: DC

## 2023-11-01 MED ORDER — JUVEN PO PACK
1.0000 | PACK | Freq: Two times a day (BID) | ORAL | Status: DC
Start: 2023-11-02 — End: 2023-11-03
  Administered 2023-11-02 (×2): 1
  Filled 2023-11-01 (×3): qty 1

## 2023-11-01 MED ORDER — SODIUM CHLORIDE 0.9 % IV SOLN
12.5000 mg | Freq: Four times a day (QID) | INTRAVENOUS | Status: DC | PRN
  Administered 2023-11-01 – 2023-11-02 (×2): 12.5 mg via INTRAVENOUS
  Filled 2023-11-01: qty 12.5
  Filled 2023-11-01: qty 0.5

## 2023-11-01 MED ORDER — LEVETIRACETAM IN NACL 1500 MG/100ML IV SOLN
1500.0000 mg | Freq: Two times a day (BID) | INTRAVENOUS | Status: DC
Start: 2023-11-01 — End: 2023-11-01
  Administered 2023-11-01: 1500 mg via INTRAVENOUS
  Filled 2023-11-01 (×2): qty 100

## 2023-11-01 MED ORDER — SODIUM CHLORIDE 0.9 % IV SOLN
1.0000 g | Freq: Once | INTRAVENOUS | Status: AC
  Administered 2023-11-01: 1 g via INTRAVENOUS
  Filled 2023-11-01 (×2): qty 20

## 2023-11-01 MED ORDER — CLONIDINE HCL 0.1 MG PO TABS
0.1000 mg | ORAL_TABLET | Freq: Every day | ORAL | Status: DC
  Administered 2023-11-01 – 2023-11-03 (×3): 0.1 mg via ORAL
  Filled 2023-11-01 (×3): qty 1

## 2023-11-01 MED ORDER — INSULIN ASPART 100 UNIT/ML IJ SOLN
0.0000 [IU] | Freq: Three times a day (TID) | INTRAMUSCULAR | Status: DC
Start: 2023-11-01 — End: 2023-11-03

## 2023-11-01 MED ORDER — LACTATED RINGERS IV BOLUS
500.0000 mL | Freq: Once | INTRAVENOUS | Status: AC
  Administered 2023-11-01: 500 mL via INTRAVENOUS

## 2023-11-01 MED ORDER — VITAMIN C 500 MG PO TABS
500.0000 mg | ORAL_TABLET | Freq: Two times a day (BID) | ORAL | Status: DC
  Administered 2023-11-02 – 2023-11-03 (×3): 500 mg via ORAL
  Filled 2023-11-01 (×3): qty 1

## 2023-11-01 MED ORDER — PAROXETINE HCL 10 MG PO TABS
10.0000 mg | ORAL_TABLET | Freq: Every day | ORAL | Status: DC
  Administered 2023-11-01 – 2023-11-03 (×3): 10 mg via ORAL
  Filled 2023-11-01 (×4): qty 1

## 2023-11-01 MED ORDER — ONDANSETRON HCL 4 MG/2ML IJ SOLN
4.0000 mg | Freq: Four times a day (QID) | INTRAMUSCULAR | Status: DC
  Administered 2023-11-01 – 2023-11-03 (×7): 4 mg via INTRAVENOUS
  Filled 2023-11-01 (×8): qty 2

## 2023-11-01 MED ORDER — FREE WATER
200.0000 mL | Freq: Four times a day (QID) | Status: DC
  Administered 2023-11-01 – 2023-11-03 (×8): 200 mL

## 2023-11-01 MED ORDER — SODIUM CHLORIDE 0.9 % IV SOLN
12.5000 mg | Freq: Three times a day (TID) | INTRAVENOUS | Status: DC | PRN
  Administered 2023-11-01: 12.5 mg via INTRAVENOUS
  Filled 2023-11-01: qty 12.5

## 2023-11-01 MED ORDER — ACETAMINOPHEN 650 MG RE SUPP: 650.0000 mg | Freq: Four times a day (QID) | RECTAL | Status: DC | PRN

## 2023-11-01 MED ORDER — DOXERCALCIFEROL 4 MCG/2ML IV SOLN
1.0000 ug | INTRAVENOUS | Status: DC
Start: 2023-11-01 — End: 2023-11-03
  Administered 2023-11-02: 1 ug via INTRAVENOUS
  Filled 2023-11-01 (×4): qty 2

## 2023-11-01 MED ORDER — INSULIN GLARGINE-YFGN 100 UNIT/ML ~~LOC~~ SOLN
5.0000 [IU] | Freq: Every day | SUBCUTANEOUS | Status: DC
  Administered 2023-11-01: 5 [IU] via SUBCUTANEOUS
  Filled 2023-11-01 (×3): qty 0.05

## 2023-11-01 MED ORDER — FAMOTIDINE IN NACL 20-0.9 MG/50ML-% IV SOLN
20.0000 mg | Freq: Once | INTRAVENOUS | Status: AC
  Administered 2023-11-01: 20 mg via INTRAVENOUS
  Filled 2023-11-01: qty 50

## 2023-11-01 MED ORDER — NEPRO/CARBSTEADY PO LIQD
237.0000 mL | Freq: Three times a day (TID) | ORAL | Status: DC
  Administered 2023-11-01 – 2023-11-02 (×4): 237 mL via ORAL

## 2023-11-01 MED ORDER — AMIODARONE HCL 200 MG PO TABS
100.0000 mg | ORAL_TABLET | Freq: Every day | ORAL | Status: DC
  Administered 2023-11-01 – 2023-11-03 (×3): 100 mg via ORAL
  Filled 2023-11-01 (×3): qty 1

## 2023-11-01 MED ORDER — LEVETIRACETAM 500 MG PO TABS
1500.0000 mg | ORAL_TABLET | Freq: Two times a day (BID) | ORAL | Status: DC
  Administered 2023-11-01 – 2023-11-03 (×4): 1500 mg via ORAL
  Filled 2023-11-01 (×4): qty 3

## 2023-11-01 MED ORDER — POTASSIUM CHLORIDE 20 MEQ PO PACK
20.0000 meq | PACK | Freq: Once | ORAL | Status: AC
  Administered 2023-11-01: 20 meq
  Filled 2023-11-01: qty 1

## 2023-11-01 MED ORDER — MEDIHONEY WOUND/BURN DRESSING EX PSTE
1.0000 | PASTE | Freq: Every day | CUTANEOUS | Status: DC
  Administered 2023-11-01 – 2023-11-02 (×2): 1 via TOPICAL
  Filled 2023-11-01: qty 44

## 2023-11-01 MED ORDER — PANTOPRAZOLE SODIUM 40 MG IV SOLR
40.0000 mg | Freq: Two times a day (BID) | INTRAVENOUS | Status: DC
  Administered 2023-11-01 – 2023-11-03 (×5): 40 mg via INTRAVENOUS
  Filled 2023-11-01 (×5): qty 10

## 2023-11-01 MED ORDER — CARVEDILOL 25 MG PO TABS
25.0000 mg | ORAL_TABLET | Freq: Two times a day (BID) | ORAL | Status: DC
  Administered 2023-11-01 – 2023-11-03 (×4): 25 mg via ORAL
  Filled 2023-11-01 (×4): qty 1

## 2023-11-01 MED ORDER — ONDANSETRON HCL 4 MG/2ML IJ SOLN
4.0000 mg | Freq: Once | INTRAMUSCULAR | Status: AC
  Administered 2023-11-01: 4 mg via INTRAVENOUS
  Filled 2023-11-01: qty 2

## 2023-11-01 NOTE — Progress Notes (Signed)
Pharmacy Antibiotic Note  Bruce Little is a 52 y.o. male admitted on 11/01/2023 presenting with worsening nausea/vomiting, concern for UTI, hx of indwelling foley .  Pharmacy has been consulted for Merrem dosing.  ESRD-HD usually TTS  Plan: Merrem 1g IV x 1, then 500 mg IV q 24h Monitor HD schedule, UCx to narrow  Height: 6\' 3"  (190.5 cm) Weight: 104.3 kg (230 lb) IBW/kg (Calculated) : 84.5  Temp (24hrs), Avg:98.4 F (36.9 C), Min:98.3 F (36.8 C), Max:98.5 F (36.9 C)  Recent Labs  Lab 11/01/23 0618 11/01/23 0620  WBC  --  16.2*  CREATININE  --  3.29*  LATICACIDVEN 0.8  --     Estimated Creatinine Clearance: 34.7 mL/min (A) (by C-G formula based on SCr of 3.29 mg/dL (H)).    No Known Allergies  Daylene Posey, PharmD, [facility] Clinical Pharmacist ED Pharmacist Phone # [phone] 11/01/2023 12:04 PM

## 2023-11-01 NOTE — Progress Notes (Signed)
Initial Nutrition Assessment  DOCUMENTATION CODES:   Not applicable  INTERVENTION:   Nepro Shake po TID, each supplement provides 425 kcal and 19 grams protein  Rena-vit po daily   Juven Fruit Punch BID via tube, each serving provides 95kcal and 2.5g of protein (amino acids glutamine and arginine)  Vitamin C 500mg  BID via tube  Zinc 220mg  daily via tube x 14 days   Pt at high refeed risk; recommend monitor potassium, magnesium and phosphorus labs daily until stable  Daily weights   If patient with poor oral intake, recommend G-tube feedings of:  Nepro supplement- Give 6 cartons daily via tube- flush tube with 30ml of water before and after each feed.   ProSource TF 20- Give 60ml daily via tube, each supplement provides 80kcal and 20g of protein.   Regimen provides 2600kcal/day, 134g/day protein and 1352ml/day of free water.   Check vitamins A, E, D, C, zinc and copper   NUTRITION DIAGNOSIS:   Increased nutrient needs related to chronic illness (ESRD on HD, COVID 19) as evidenced by estimated needs.  GOAL:   Patient will meet greater than or equal to 90% of their needs  MONITOR:   PO intake, Supplement acceptance, Labs, Weight trends, I & O's, Skin  REASON FOR ASSESSMENT:   Consult Assessment of nutrition requirement/status  ASSESSMENT:   52 y.o. male with complex medical history significant of intracranial bleed s/p emergent craniectomy in [place] June 2024, ESRD on hemodialysis, sacral decubitus with VAC s/p serial debridements, stroke with dysphagia requiring trach/PEG tube (July 2024), chronic Foley catheter with recurrent UTI, chronic nausea not associated with vertigo symptoms, diabetes mellitus on insulin, hypertension, dyslipidemia, dysphagia, atrial fibrillation, L BKA and bedbound who is admitted with COVID 19.  RD working remotely.  Spoke with pt's wife via phone. Wife reports pt s/p G-tube placement in July after his stroke. Wife reports that patient  was previously receiving Nepro tube feeds via his tube but reports that patient has not used his G-tube for anything but water since November. Wife reports pt with good appetite and oral intake pta. Pt has been eating broth and jello in hospital but has now been advanced to a full liquid diet. Pt eats a dysphagia 3/nectar thick diet at baseline. Pt does drink protein supplements but wife requests that RD be mindful of supplements that may increase pt's blood sugar or cause diarrhea. Pt with chronic nausea secondary to vertigo; wife reports that patient takes zofran before each meal. RD discussed with pt the importance of adequate nutrition needed to preserve lean muscle, support wound healing and replace losses from HD. Pt with increased estimated needs currently. Discussed with wife, will give patient a chance to try and meet his estimated needs via oral intake; plan will be to initiate tube feeds if pt's oral intake is poor or if he is not eating enough to meet his estimated needs. RD will add supplements and vitamins to help pt meet his estimated needs and to support wound healing. Pt is likely at refeed risk. RD will check vitamin labs important for wound healing to r/o deficiency.    Wife reports patient with weight loss initially after his stroke but reports that patient is regaining his weight now. Per chart, pt is up ~41lbs since his last weight in November; RD unsure if bed weights are correct. RD will obtain NFPE at follow up.    Medications reviewed and include: insulin, zofran, protonix, paxil  Labs reviewed: Na 134(L), K 3.4(L), creat  3.29(H) Wbc- 16.2(H), Hgb 10.0(L), Hct 30.6(L) Cbgs- 105, 94 x 24 hrs  AIC 5.1- 11/9  Diet Order:   Diet Order             Diet full liquid Room service appropriate? 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  Estimated Nutritional Needs:   Kcal:  2500-2800kcal/day  Protein:  125-140g/day  Fluid:  UOP +1L  Betsey Holiday MS, RD, LDN If unable to be reached, please send secure chat to "RD inpatient" available from 8:00a-4:00p daily

## 2023-11-01 NOTE — Progress Notes (Signed)
Pt receives out-pt HD at [place] on TTS. Will assist as needed.   Melven Sartorius Renal Navigator [phone]

## 2023-11-01 NOTE — Consult Note (Addendum)
WOC Nurse Consult Note: Reason for Consult: Pt is familiar to [facility] team from recent admission.  He has been using a Vac to the sacrum wound and is in isolation for Covid.   Wife at bedside is familiar with plan of care. Wound has decreased in size and home health nurses are changing the Vac dressing twice a week.  He was scheduled to have the dressing changed by the outpatient wound care center for the first time today, which he will miss related to the hospital admission.  Conservative sharp wound debridement (CSWD performed at the bedside): Left lower buttock with Unstageable pressure injury; 2.5X2.5cm, 100% loose yellow slough, trimmed some of the outer slough with scissors and patient tolerated without pain or bleeding.   Sacrum with Stage 4 pressure injury; bone visible, 85% red, 15% yellow slough adhered to wound bed, mod amt tan drainage in the cannister. 9X5X3cm with tunneling at 7:00 o'clock to 3 cm.   Pt denies c/o pain and tolerated without apparent discomfort.  Applied barrier ring to lower wound edges to attempt to maintain a seal, related to the close proximity to the rectum, then one piece black foam int wound and tunneling area, then bridged track pad away from sacrum/back to right hip to reduce pressure. Pt is currently connected to his home Vac machine at cont suction. He plans to be admitted and is currently in the ED. Orders placed for bedside nurse to order Vac machine and cannister and attach when he is transferred to the floor.  Explained to the wife this is hospital policy, and he can resume use of the home machine upon discharge.  Pressure Injury POA: Yes Dressing procedure/placement/frequency: Air mattress ordered when the Pt is transferred to the floor to reduce pressure. Topical treatment orders provided for bedside nurses to perform as follows to assist with removal of nonviable tissue: Apply Medihoney to left buttock wound Q days or PRN soiling Please re-consult if  further assistance is needed.  Thank-you,  Cammie Mcgee MSN, RN, CWOCN, [place], CNS [phone]

## 2023-11-01 NOTE — ED Notes (Signed)
ED TO INPATIENT HANDOFF REPORT  ED Nurse Name and Phone #: Dahlia Client [phone]  S Name/Age/Gender Bruce Little 52 y.o. male Room/Bed: 020C/020C  Code Status   Code Status: Prior  Home/SNF/Other Home Patient oriented to: self, place, time, and situation Is this baseline? Yes   Triage Complete: Triage complete  Chief Complaint Acute COVID-19 [U07.1]  Triage Note BIB EMS - N/V since yesterday, denies abd pain. EMS gave 4mg  Zofran IV. Denies diarrhea.   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 Bed bound.    Allergies No Known Allergies  Level of Care/Admitting Diagnosis ED Disposition     ED Disposition  Admit   Condition  --   Comment  Hospital Area: [facility] [[id number]]  Level of Care: Progressive [102]  Admit to Progressive based on following criteria: MULTISYSTEM THREATS such as stable sepsis, metabolic/electrolyte imbalance with or without encephalopathy that is responding to early treatment.  May admit patient to [facility] or [facility] if equivalent level of care is available:: No  Covid Evaluation: Confirmed COVID Positive  Diagnosis: Acute COVID-19 [[id number]]  Admitting Physician: Dorcas Carrow [[id number]]  Attending Physician: Dorcas Carrow [[id number]]  Certification:: I certify this patient will need inpatient services for at least 2 midnights  Expected Medical Readiness: 11/06/2023          B Medical/Surgery History Past Medical History:  Diagnosis Date   A-fib (HCC)    Anemia    low iron   Chronic kidney disease    COVID    has had it 2 times, one mild and one wasn't   DM2 (diabetes mellitus, type 2) (HCC)    History of blood transfusion    HTN (hypertension)    Osteomyelitis of fifth toe of left foot (HCC) 08/13/2021   Osteomyelitis of fourth toe of left foot (HCC) 08/13/2021   Pneumonia    Stroke (HCC) 05/19/2021   no residual effects.   Stroke [facility])    Past Surgical History:   Procedure Laterality Date   AMPUTATION Left 08/29/2021   Procedure: AMPUTATION OF FOURTH TOE AND RAY ALONG WITH REMAINING FITH METATARSAL;  Surgeon: Tarry Kos, MD;  Location: [facility] OR;  Service: Orthopedics;  Laterality: Left;   AMPUTATION Left 09/03/2021   Procedure: LISFRANC AMPUTATION;  Surgeon: Tarry Kos, MD;  Location: [facility] OR;  Service: Orthopedics;  Laterality: Left;   AMPUTATION Left 10/29/2021   Procedure: AMPUTATION BELOW KNEE -LEFT;  Surgeon: Tarry Kos, MD;  Location: [facility] OR;  Service: Orthopedics;  Laterality: Left;   APPLICATION OF WOUND VAC Left 07/07/2021   Procedure: APPLICATION OF WOUND VAC;  Surgeon: Tarry Kos, MD;  Location: [facility] OR;  Service: Orthopedics;  Laterality: Left;   APPLICATION OF WOUND VAC Left 08/29/2021   Procedure: APPLICATION OF WOUND VAC;  Surgeon: Tarry Kos, MD;  Location: [facility] OR;  Service: Orthopedics;  Laterality: Left;   I & D EXTREMITY Left 07/03/2021   Procedure: IRRIGATION AND DEBRIDEMENT ,FIFTH RAY  AMPUTATION LEFT FOOT, , WOUND VAC PLACEMENT;  Surgeon: Tarry Kos, MD;  Location: [facility] OR;  Service: Orthopedics;  Laterality: Left;   I & D EXTREMITY Left 07/07/2021   Procedure: IRRIGATION AND DEBRIDEMENT LEFT FOOT;  Surgeon: Tarry Kos, MD;  Location: [facility];  Service: Orthopedics;  Laterality: Left;   I & D EXTREMITY Left 08/29/2021   Procedure: IRRIGATION AND DEBRIDEMENT LEFT FOOT;  Surgeon: Tarry Kos, MD;  Location: [facility] OR;  Service: Orthopedics;  Laterality: Left;   IR FLUORO GUIDE CV LINE RIGHT  07/09/2021   IR REMOVAL TUN CV CATH W/O FL  10/08/2021   IR REMOVAL TUN CV CATH W/O FL  07/13/2023   IR REPLACE G-TUBE SIMPLE WO FLUORO  09/20/2023   IR US GUIDE VASC ACCESS RIGHT  07/09/2021   VITRECTOMY Left    [place] eye     A IV Location/Drains/Wounds Patient Lines/Drains/Airways Status     Active Line/Drains/Airways     Name Placement date Placement time Site Days   Peripheral IV 11/01/23 20 G Anterior;Distal;Right;Upper  Arm 11/01/23  0538  Arm  less than 1   Hemodialysis Catheter Right Internal jugular 10/01/23  0000  Internal jugular  31   Tunneled CVC Double Lumen (Radiology) 07/09/21 Right Internal jugular 22 cm 07/09/21  1059  -- 845   Negative Pressure Wound Therapy Foot Anterior;Left 07/10/21  1030  --  844   Negative Pressure Wound Therapy Foot Left 08/29/21  1418  --  794   Negative Pressure Wound Therapy Buttocks Bilateral 09/28/23  --  --  34   Gastrostomy/Enterostomy Gastrostomy 20 Fr. LUQ 09/20/23  1217  LUQ  42   Urethral Catheter 09/21/23  1800  --  41   Pressure Injury 09/18/23 Buttocks Posterior;Left Stage 3 -  Full thickness tissue loss. Subcutaneous fat may be visible but bone, tendon or muscle are NOT exposed. 09/18/23  0308  -- 44   Pressure Injury 09/18/23 Sacrum Stage 4 - Full thickness tissue loss with exposed bone, tendon or muscle. 09/18/23  0308  -- 44   Pressure Injury 11/01/23 Buttocks Left Unstageable - Full thickness tissue loss in which the base of the injury is covered by slough (yellow, tan, gray, green or brown) and/or eschar (tan, brown or black) in the wound bed. 11/01/23  --  -- less than 1   Pressure Injury 11/01/23 Sacrum Stage 4 - Full thickness tissue loss with exposed bone, tendon or muscle. 11/01/23  --  -- less than 1   Wound / Incision (Open or Dehisced) 07/02/21 Toe (Comment  which one) Anterior;Left cellulitis 07/02/21  1700  Toe (Comment  which one)  852   Wound / Incision (Open or Dehisced) 09/18/23 Diabetic ulcer Foot Right;Lateral 09/18/23  0309  Foot  44   Wound / Incision (Open or Dehisced) 09/18/23 Diabetic ulcer Heel Right 09/18/23  0310  Heel  44            Intake/Output Last 24 hours  Intake/Output Summary (Last 24 hours) at 11/01/2023 1135 Last data filed at 11/01/2023 6213 Gross per 24 hour  Intake --  Output 500 ml  Net -500 ml    Labs/Imaging Results for orders placed or performed during the hospital encounter of 11/01/23 (from the past 48  hours)  Lactic acid, plasma     Status: None   Collection Time: 11/01/23  6:18 AM  Result Value Ref Range   Lactic Acid, Venous 0.8 0.5 - 1.9 mmol/L    Comment: Performed at [facility], [address], [place], [place] [address]  CBC with Differential     Status: Abnormal   Collection Time: 11/01/23  6:20 AM  Result Value Ref Range   WBC 16.2 (H) 4.0 - 10.5 K/uL   RBC 3.13 (L) 4.22 - 5.81 MIL/uL   Hemoglobin 10.0 (L) 13.0 - 17.0 g/dL   HCT 84.6 (L) 96.2 - 95.2 %   MCV  97.8 80.0 - 100.0 fL   MCH 31.9 26.0 - 34.0 pg   MCHC 32.7 30.0 - 36.0 g/dL   RDW 87.5 64.3 - 32.9 %   Platelets 408 (H) 150 - 400 K/uL   nRBC 0.0 0.0 - 0.2 %   Neutrophils Relative % 88 %   Neutro Abs 14.3 (H) 1.7 - 7.7 K/uL   Lymphocytes Relative 7 %   Lymphs Abs 1.1 0.7 - 4.0 K/uL   Monocytes Relative 4 %   Monocytes Absolute 0.7 0.1 - 1.0 K/uL   Eosinophils Relative 0 %   Eosinophils Absolute 0.0 0.0 - 0.5 K/uL   Basophils Relative 0 %   Basophils Absolute 0.0 0.0 - 0.1 K/uL   Immature Granulocytes 1 %   Abs Immature Granulocytes 0.11 (H) 0.00 - 0.07 K/uL    Comment: Performed at [facility], [address], [place], [place] [address]  Comprehensive metabolic panel     Status: Abnormal   Collection Time: 11/01/23  6:20 AM  Result Value Ref Range   Sodium 134 (L) 135 - 145 mmol/L   Potassium 3.4 (L) 3.5 - 5.1 mmol/L   Chloride 90 (L) 98 - 111 mmol/L   CO2 27 22 - 32 mmol/L   Glucose, Bld 122 (H) 70 - 99 mg/dL    Comment: Glucose reference range applies only to samples taken after fasting for at least 8 hours.   BUN 18 6 - 20 mg/dL   Creatinine, Ser 1.66 (H) 0.61 - 1.24 mg/dL   Calcium 8.6 (L) 8.9 - 10.3 mg/dL   Total Protein 6.7 6.5 - 8.1 g/dL   Albumin 2.0 (L) 3.5 - 5.0 g/dL   AST 27 15 - 41 U/L   ALT 28 0 - 44 U/L   Alkaline Phosphatase 120 38 - 126 U/L   Total Bilirubin 0.7 <1.2 mg/dL   GFR, Estimated 22 (L) >60 mL/min    Comment: (NOTE) Calculated using the CKD-EPI Creatinine  Equation (2021)    Anion gap 17 (H) 5 - 15    Comment: Performed at [facility], [address], [place], [place] [address]  Lipase, blood     Status: None   Collection Time: 11/01/23  6:20 AM  Result Value Ref Range   Lipase 49 11 - 51 U/L    Comment: Performed at [facility], [address] [place], [place] [address]  Troponin I (High Sensitivity)     Status: None   Collection Time: 11/01/23  6:20 AM  Result Value Ref Range   Troponin I (High Sensitivity) 16 <18 ng/L    Comment: (NOTE) Elevated high sensitivity troponin I (hsTnI) values and significant  changes across serial measurements may suggest ACS but many other  chronic and acute conditions are known to elevate hsTnI results.  Refer to the "Links" section for chest pain algorithms and additional  guidance. Performed at [facility], [address], [place], [place] [address]   Urinalysis, Routine w reflex microscopic -Urine, Clean Catch     Status: Abnormal   Collection Time: 11/01/23  8:06 AM  Result Value Ref Range   Color, Urine AMBER (A) YELLOW    Comment: BIOCHEMICALS MAY BE AFFECTED BY COLOR   APPearance CLOUDY (A) CLEAR   Specific Gravity, Urine 1.016 1.005 - 1.030   pH 6.0 5.0 - 8.0   Glucose, UA NEGATIVE NEGATIVE mg/dL   Hgb urine dipstick NEGATIVE NEGATIVE   Bilirubin Urine NEGATIVE NEGATIVE  Ketones, ur NEGATIVE NEGATIVE mg/dL   Protein, ur >=865 (A) NEGATIVE mg/dL   Nitrite NEGATIVE NEGATIVE   Leukocytes,Ua LARGE (A) NEGATIVE   RBC / HPF 6-10 0 - 5 RBC/hpf   WBC, UA >50 0 - 5 WBC/hpf   Bacteria, UA MANY (A) NONE SEEN   Squamous Epithelial / HPF 0-5 0 - 5 /HPF   WBC Clumps PRESENT    Mucus PRESENT    Budding Yeast PRESENT     Comment: Performed at [facility], [address] [place], [place] [address]  Resp panel by RT-PCR (RSV, Flu A&B, Covid) Urine, Catheterized     Status: Abnormal   Collection Time: 11/01/23  8:06 AM   Specimen: Urine, Catheterized; Nasal Swab   Result Value Ref Range   SARS Coronavirus 2 by RT PCR POSITIVE (A) NEGATIVE   Influenza A by PCR NEGATIVE NEGATIVE   Influenza B by PCR NEGATIVE NEGATIVE    Comment: (NOTE) The Xpert Xpress SARS-CoV-2/FLU/RSV plus assay is intended as an aid in the diagnosis of influenza from Nasopharyngeal swab specimens and should not be used as a sole basis for treatment. Nasal washings and aspirates are unacceptable for Xpert Xpress SARS-CoV-2/FLU/RSV testing.  Fact Sheet for Patients: BloggerCourse.com  Fact Sheet for Healthcare Providers: SeriousBroker.it  This test is not yet approved or cleared by the [place] FDA and has been authorized for detection and/or diagnosis of SARS-CoV-2 by FDA under an Emergency Use Authorization (EUA). This EUA will remain in effect (meaning this test can be used) for the duration of the COVID-19 declaration under Section 564(b)(1) of the Act, 21 U.S.C. section 360bbb-3(b)(1), unless the authorization is terminated or revoked.     Resp Syncytial Virus by PCR NEGATIVE NEGATIVE    Comment: (NOTE) Fact Sheet for Patients: BloggerCourse.com  Fact Sheet for Healthcare Providers: SeriousBroker.it  This test is not yet approved or cleared by the [place] FDA and has been authorized for detection and/or diagnosis of SARS-CoV-2 by FDA under an Emergency Use Authorization (EUA). This EUA will remain in effect (meaning this test can be used) for the duration of the COVID-19 declaration under Section 564(b)(1) of the Act, 21 U.S.C. section 360bbb-3(b)(1), unless the authorization is terminated or revoked.  Performed at [facility], [address] [place], [place] [address]    CT ABDOMEN PELVIS WO CONTRAST Result Date: 11/01/2023 CLINICAL DATA:  Nausea, vomiting. EXAM: CT ABDOMEN AND PELVIS WITHOUT CONTRAST TECHNIQUE: Multidetector CT imaging of  the abdomen and pelvis was performed following the standard protocol without IV contrast. RADIATION DOSE REDUCTION: This exam was performed according to the departmental dose-optimization program which includes automated exposure control, adjustment of the mA and/or kV according to patient size and/or use of iterative reconstruction technique. COMPARISON:  September 17, 2023. FINDINGS: Lower chest: Minimal left lower lobe airspace opacity is noted suggesting possible inflammation or atelectasis. Hepatobiliary: No focal liver abnormality is seen. No gallstones, gallbladder wall thickening, or biliary dilatation. Pancreas: Unremarkable. No pancreatic ductal dilatation or surrounding inflammatory changes. Spleen: Normal in size without focal abnormality. Adrenals/Urinary Tract: Adrenal glands and kidneys appear normal. Urinary bladder is decompressed secondary to Foley catheter. Stomach/Bowel: Gastrostomy tube is in good position. There is no evidence of bowel obstruction or inflammation. The appendix appears normal. Large amount of stool seen in the rectum. Vascular/Lymphatic: Aortic atherosclerosis. No enlarged abdominal or pelvic lymph nodes. Reproductive: Prostate is unremarkable. Other: No ascites or hernia is noted. There is again noted large sacral decubitus  ulcer although it is improved compared to prior exam. There is stable destruction of portions of the sacrum and coccyx related to chronic osteomyelitis. Musculoskeletal: Chronic findings as noted above. No acute osseous abnormality. IMPRESSION: Sacral decubitus ulcer is significantly smaller compared to prior exam, with stable destructive changes involving sacrum and coccyx consistent with chronic osteomyelitis. Gastrostomy tube is in grossly good position. Large amount of stool seen in rectum concerning for impaction. Minimal left lower lobe airspace opacity is noted suggesting possible inflammation or atelectasis. Aortic Atherosclerosis (ICD10-I70.0).  Electronically Signed   By: Lupita Raider M.D.   On: 11/01/2023 08:42   CT Head Wo Contrast Result Date: 11/01/2023 CLINICAL DATA:  Head trauma, skull fracture or hematoma. EXAM: CT HEAD WITHOUT CONTRAST TECHNIQUE: Contiguous axial images were obtained from the base of the skull through the vertex without intravenous contrast. RADIATION DOSE REDUCTION: This exam was performed according to the departmental dose-optimization program which includes automated exposure control, adjustment of the mA and/or kV according to patient size and/or use of iterative reconstruction technique. 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 The frontal parietal region is bulging outward at the craniectomy defect and anteriorly there is somewhat greater sunken curvature. No midline shift or hydrocephalus. No evidence of acute infarct, mass, or acute hemorrhage. Vascular: No acute finding. Skull: Normal. Negative for fracture or focal lesion. Sinuses/Orbits: No acute finding. IMPRESSION: No acute finding.  No hemorrhage or new infarct. Electronically Signed   By: Tiburcio Pea M.D.   On: 11/01/2023 08:33   DG ABD ACUTE 2+V W 1V CHEST Result Date: 11/01/2023 CLINICAL DATA:  Vomiting. EXAM: DG ABDOMEN ACUTE WITH 1 VIEW CHEST COMPARISON:  View chest x-ray 11/01/2023 FINDINGS: A right IJ dialysis catheter is stable. The heart size is normal. Lung volumes are low. No focal airspace disease is present. Bowel gas pattern is normal. No obstruction or free air is present. Mild degenerative changes are present in the lower lumbar spine. IMPRESSION: 1. No acute cardiopulmonary disease. 2. Normal bowel gas pattern. Electronically Signed   By: Marin Roberts M.D.   On: 11/01/2023 06:55    Pending Labs Unresulted Labs (From admission, onward)     Start     Ordered   11/01/23 0849  Urine Culture  Add-on,   AD       Question:   Indication  Answer:  Dysuria   11/01/23 0848            Vitals/Pain Today's Vitals   11/01/23 0543 11/01/23 0730 11/01/23 0800 11/01/23 0955  BP:  131/82 (!) 131/90   Pulse:  (!) 104 (!) 106   Resp:  19 18   Temp:    98.3 F (36.8 C)  TempSrc:    Oral  SpO2:  99% 98%   Weight: 104.3 kg     Height: 6\' 3"  (1.905 m)     PainSc:        Isolation Precautions Airborne and Contact precautions  Medications Medications  promethazine (PHENERGAN) 12.5 mg in sodium chloride 0.9 % 50 mL IVPB (0 mg Intravenous Stopped 11/01/23 1025)  leptospermum manuka honey (MEDIHONEY) paste 1 Application (has no administration in time range)  ondansetron (ZOFRAN) injection 4 mg (4 mg Intravenous Given 11/01/23 0625)  lactated ringers bolus 500 mL (0 mLs Intravenous Stopped 11/01/23 0810)  famotidine (PEPCID) IVPB 20 mg premix (0 mg Intravenous Stopped 11/01/23 0915)    Mobility Bed bound  Focused Assessments     R Recommendations: See Admitting Provider Note  Report given to:   Additional Notes:

## 2023-11-01 NOTE — ED Provider Notes (Signed)
[place] EMERGENCY DEPARTMENT AT [facility] Provider Note   CSN: [id number] Arrival date & time: 11/01/23  4098     History  Chief Complaint  Patient presents with   Emesis    Bruce Little is a 52 y.o. 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  The patient has chronic nausea associated with movement since his brain surgery.  However it has been worse over the past 2 to 3 days, with very persistent vomiting, not responding to Zofran.  The patient denies that he is having a headache.  His wife was concerned at the vomit had some flecks of dark coffee-ground appearance in it earlier this evening.  Patient was also having symptoms with substernal chest burning after vomiting.  His wife reports that they have an appointment at a wound care clinic this morning for consideration of wound VAC removal from his sacral wound.  The patient has an indwelling Foley catheter was exchanged 1 week ago.  He completed a course of antibiotics for potential UTI at that time, and switched to Augmentin, which she has been taking for several days now.  He has been on Augmentin in the past.  Most recently was discharged from the hospital approximately 1 month ago for sepsis secondary to presumed UTI as well as sacral decubitus ulceration with wound VAC dysfunction.  He required debridement at bedside with general surgery on 11/11, and discharged on Augmentin.  Last urine culture positive on record on 09/17/23  Component Ref Range & Units (hover) 1 mo  ago  Specimen Description URINE, CATHETERIZED  Special Requests NONE Performed at [facility], [address], [place], [place] [address]  Culture 80,000 COLONIES/mL PSEUDOMONAS AERUGINOSA Abnormal   Report Status 09/20/2023 FINAL  Organism ID, Bacteria PSEUDOMONAS AERUGINOSA Abnormal   Resulting Agency [facility]     Susceptibility   Pseudomonas aeruginosa    MIC    CEFTAZIDIME 4 SENSITIVE Sensitive    CIPROFLOXACIN 1 INTERMEDI... Intermediate    GENTAMICIN <=1 SENSITIVE Sensitive    IMIPENEM 1 SENSITIVE Sensitive    PIP/TAZO 8 SENSITIVE... Sensitive       HPI     Home Medications Prior to Admission medications   Medication Sig Start Date End Date Taking? Authorizing Provider  albuterol (VENTOLIN HFA) 108 (90 Base) MCG/ACT inhaler Inhale 2 puffs into the lungs every 4 (four) hours as needed. 09/15/23   [provider]  amiodarone (PACERONE) 100 MG tablet Take 100 mg by mouth daily.    [provider]  amLODipine (NORVASC) 10 MG tablet Take 10 mg by mouth daily.    [provider]  ascorbic acid (VITAMIN C) 500 MG tablet Take 1 tablet (500 mg total) by mouth 2 (two) times daily. 10/02/23   [name], MD  atorvastatin (LIPITOR) 40 MG tablet Take 40 mg by mouth at bedtime.    [provider]  carvedilol (COREG) 25 MG tablet Take 1 tablet (25 mg total) by mouth 2 (two) times daily with a meal. 10/02/23   [name], MD  cloNIDine (  CATAPRES) 0.1 MG tablet Take 1 tablet (0.1 mg total) by mouth daily. 10/02/23   [name], MD  feeding supplement (ENSURE ENLIVE / ENSURE PLUS) LIQD Take 237 mLs by mouth 2 (two) times daily between meals. 10/02/23   [name], MD  finasteride (PROSCAR) 5 MG tablet Take 5 mg by mouth daily.    [provider]  food thickener (SIMPLYTHICK, NECTAR/LEVEL 2/MILDLY THICK,) GEL Take 1 packet by mouth as needed. 10/02/23   [name], MD  furosemide (LASIX)  40 MG tablet Take 40 mg by mouth daily.    [provider]  glipiZIDE (GLUCOTROL) 5 MG tablet Take 1 tablet (5 mg total) by mouth daily. 05/21/21 05/21/22  Marguerita Merles Latif, DO  hydrALAZINE (APRESOLINE) 25 MG tablet Take 25 mg by mouth every 8 (eight) hours.    [provider]  LANTUS SOLOSTAR 100 UNIT/ML Solostar Pen Inject 5 Units into the skin daily. 10/02/23   [name], MD  leptospermum manuka honey (MEDIHONEY) PSTE paste Apply 1 Application topically daily. 10/02/23 11/01/23  [name], MD  levETIRAcetam (KEPPRA) 750 MG tablet Take 1,500 mg by mouth 2 (two) times daily.    [provider]  losartan (COZAAR) 50 MG tablet Take 1 tablet (50 mg total) by mouth daily. 10/02/23   [name], MD  Melatonin 10 MG TABS Take 1 tablet by mouth at bedtime.    [provider]  methocarbamol (ROBAXIN) 500 MG tablet Take 1 tablet (500 mg total) by mouth every 8 (eight) hours as needed for muscle spasms. 10/02/23   [name], MD  multivitamin (RENA-VIT) TABS tablet Take 1 tablet by mouth at bedtime. 10/02/23   [name], MD  nutrition supplement, JUVEN, [employer]) PACK Take 1 packet by mouth 2 (two) times daily between meals. 10/02/23   [name], MD  nystatin (MYCOSTATIN) 100000 UNIT/ML suspension Take 5 mLs by mouth 4 (four) times daily. 09/16/23   [provider]  ondansetron (ZOFRAN) 4 MG tablet Take 1 tablet (4 mg total) by mouth every 8 (eight) hours as needed for nausea or vomiting. 10/08/21   Cristie Hem, PA-C  ondansetron (ZOFRAN-ODT) 4 MG disintegrating tablet Take 4 mg by mouth every 4 (four) hours as needed. 10/12/23   [provider]  pantoprazole (PROTONIX) 40 MG tablet Take 1 tablet (40 mg total) by mouth daily. Patient taking differently: Take 40 mg by mouth 2 (two) times daily. 05/22/21   Marguerita Merles Latif, DO  PARoxetine (PAXIL) 20 MG tablet Take 10 mg by mouth daily.     [provider]  terazosin (HYTRIN) 1 MG capsule Take 1 mg by mouth at bedtime.    [provider]  Vitamin D, Ergocalciferol, (DRISDOL) 1.25 MG (50000 UNIT) CAPS capsule Take 1 capsule (50,000 Units total) by mouth every 7 (seven) days. 10/03/23   [name], MD      Allergies    Patient has no known allergies.    Review of Systems   Review of Systems  Physical Exam Updated Vital Signs BP (!) 131/90   Pulse (!) 106   Temp 98.3 F (36.8 C) (Oral)   Resp 18   Ht 6\' 3"  (1.905 m)   Wt 104.3 kg   SpO2 98%   BMI 28.75 kg/m  Physical Exam Constitutional:      General: He is not in acute distress. HENT:     Head: Normocephalic.  Comments: Partial craniectomy with skin flap closure, no redness noted of the skin Eyes:     Conjunctiva/sclera: Conjunctivae normal.     Pupils: Pupils are equal, round, and reactive to light.  Cardiovascular:     Rate and Rhythm: Normal rate and regular rhythm.  Pulmonary:     Effort: Pulmonary effort is normal. No respiratory distress.  Abdominal:     General: There is no distension.     Tenderness: There is no abdominal tenderness.  Genitourinary:    Comments: Indwelling foley with cloudy urine Skin:    General: Skin is warm and dry.     Comments: Decubitus ulceration with wound vac in place, no significant surrounding erythema  Neurological:     Mental Status: He is alert. Mental status is at baseline.     ED Results / Procedures / Treatments   Labs (all labs ordered are listed, but only abnormal results are displayed) Labs Reviewed  RESP PANEL BY RT-PCR (RSV, FLU A&B, COVID)  RVPGX2 - Abnormal; Notable for the following components:      Result Value   SARS Coronavirus 2 by RT PCR POSITIVE (*)    All other components within normal limits  CBC WITH DIFFERENTIAL/PLATELET - Abnormal; Notable for the following components:   WBC 16.2 (*)    RBC 3.13 (*)    Hemoglobin 10.0 (*)    HCT 30.6 (*)    Platelets  408 (*)    Neutro Abs 14.3 (*)    Abs Immature Granulocytes 0.11 (*)    All other components within normal limits  COMPREHENSIVE METABOLIC PANEL - Abnormal; Notable for the following components:   Sodium 134 (*)    Potassium 3.4 (*)    Chloride 90 (*)    Glucose, Bld 122 (*)    Creatinine, Ser 3.29 (*)    Calcium 8.6 (*)    Albumin 2.0 (*)    GFR, Estimated 22 (*)    Anion gap 17 (*)    All other components within normal limits  URINALYSIS, ROUTINE W REFLEX MICROSCOPIC - Abnormal; Notable for the following components:   Color, Urine AMBER (*)    APPearance CLOUDY (*)    Protein, ur >=300 (*)    Leukocytes,Ua LARGE (*)    Bacteria, UA MANY (*)    All other components within normal limits  URINE CULTURE  LIPASE, BLOOD  LACTIC ACID, PLASMA  TROPONIN I (HIGH SENSITIVITY)    EKG EKG Interpretation Date/Time:  Monday November 01 2023 07:18:13 EST Ventricular Rate:  93 PR Interval:  170 QRS Duration:  89 QT Interval:  387 QTC Calculation: 482 R Axis:   30  Text Interpretation: Sinus rhythm Borderline T wave abnormalities Borderline prolonged QT interval No significant change was found Confirmed by Glynn Octave [phone]) on 11/01/2023 7:23:27 AM  Radiology CT ABDOMEN PELVIS WO CONTRAST Result Date: 11/01/2023 CLINICAL DATA:  Nausea, vomiting. EXAM: CT ABDOMEN AND PELVIS WITHOUT CONTRAST TECHNIQUE: Multidetector CT imaging of the abdomen and pelvis was performed following the standard protocol without IV contrast. RADIATION DOSE REDUCTION: This exam was performed according to the departmental dose-optimization program which includes automated exposure control, adjustment of the mA and/or kV according to patient size and/or use of iterative reconstruction technique. COMPARISON:  September 17, 2023. FINDINGS: Lower chest: Minimal left lower lobe airspace opacity is noted suggesting possible inflammation or atelectasis. Hepatobiliary: No focal liver abnormality is seen. No gallstones,  gallbladder wall thickening, or biliary dilatation. Pancreas: Unremarkable. No pancreatic ductal dilatation or  surrounding inflammatory changes. Spleen: Normal in size without focal abnormality. Adrenals/Urinary Tract: Adrenal glands and kidneys appear normal. Urinary bladder is decompressed secondary to Foley catheter. Stomach/Bowel: Gastrostomy tube is in good position. There is no evidence of bowel obstruction or inflammation. The appendix appears normal. Large amount of stool seen in the rectum. Vascular/Lymphatic: Aortic atherosclerosis. No enlarged abdominal or pelvic lymph nodes. Reproductive: Prostate is unremarkable. Other: No ascites or hernia is noted. There is again noted large sacral decubitus ulcer although it is improved compared to prior exam. There is stable destruction of portions of the sacrum and coccyx related to chronic osteomyelitis. Musculoskeletal: Chronic findings as noted above. No acute osseous abnormality. IMPRESSION: Sacral decubitus ulcer is significantly smaller compared to prior exam, with stable destructive changes involving sacrum and coccyx consistent with chronic osteomyelitis. Gastrostomy tube is in grossly good position. Large amount of stool seen in rectum concerning for impaction. Minimal left lower lobe airspace opacity is noted suggesting possible inflammation or atelectasis. Aortic Atherosclerosis (ICD10-I70.0). Electronically Signed   By: Lupita Raider M.D.   On: 11/01/2023 08:42   CT Head Wo Contrast Result Date: 11/01/2023 CLINICAL DATA:  Head trauma, skull fracture or hematoma. EXAM: CT HEAD WITHOUT CONTRAST TECHNIQUE: Contiguous axial images were obtained from the base of the skull through the vertex without intravenous contrast. RADIATION DOSE REDUCTION: This exam was performed according to the departmental dose-optimization program which includes automated exposure control, adjustment of the mA and/or kV according to patient size and/or use of iterative  reconstruction technique. 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 The frontal parietal region is bulging outward at the craniectomy defect and anteriorly there is somewhat greater sunken curvature. No midline shift or hydrocephalus. No evidence of acute infarct, mass, or acute hemorrhage. Vascular: No acute finding. Skull: Normal. Negative for fracture or focal lesion. Sinuses/Orbits: No acute finding. IMPRESSION: No acute finding.  No hemorrhage or new infarct. Electronically Signed   By: Tiburcio Pea M.D.   On: 11/01/2023 08:33   DG ABD ACUTE 2+V W 1V CHEST Result Date: 11/01/2023 CLINICAL DATA:  Vomiting. EXAM: DG ABDOMEN ACUTE WITH 1 VIEW CHEST COMPARISON:  View chest x-ray 11/01/2023 FINDINGS: A right IJ dialysis catheter is stable. The heart size is normal. Lung volumes are low. No focal airspace disease is present. Bowel gas pattern is normal. No obstruction or free air is present. Mild degenerative changes are present in the lower lumbar spine. IMPRESSION: 1. No acute cardiopulmonary disease. 2. Normal bowel gas pattern. Electronically Signed   By: Marin Roberts M.D.   On: 11/01/2023 06:55    Procedures Procedures    Medications Ordered in ED Medications  promethazine (PHENERGAN) 12.5 mg in sodium chloride 0.9 % 50 mL IVPB (0 mg Intravenous Stopped 11/01/23 1025)  ondansetron (ZOFRAN) injection 4 mg (4 mg Intravenous Given 11/01/23 0625)  lactated ringers bolus 500 mL (0 mLs Intravenous Stopped 11/01/23 0810)  famotidine (PEPCID) IVPB 20 mg premix (0 mg Intravenous Stopped 11/01/23 0915)    ED Course/ Medical Decision Making/ A&P Clinical Course as of 11/01/23 1124  Mon Nov 01, 2023  2130 CT head with no emergent findings [MT]  0848 Concern for impaction reported on CT imaging but pt had a very large BM in the room [MT]  1004 SARS Coronavirus 2 by RT PCR(!): POSITIVE  [MT]  1054 Wound care nurse at bedside [MT]  1054 Pt and family updated about  diagnoses - agreed to admit to hospital given intractable nausea, acute covid, need for dialysis today (he was scheduled at 11 am).  Will follow up urine cx but hold off on antibiotics at this time. [MT]    Clinical Course User Index [MT] Yareliz Thorstenson, Kermit Balo, MD                                 Medical Decision Making Amount and/or Complexity of Data Reviewed Labs: ordered. Decision-making details documented in ED Course. Radiology: ordered.  Risk Prescription drug management. Decision regarding hospitalization.   This patient presents to the ED with concern for nausea, vomiting, worsening vertigo. This involves an extensive number of treatment options, and is a complaint that carries with it a high risk of complications and morbidity.  The differential diagnosis includes viral illness versus intra-abdominal process versus metabolic derangement versus infection versus other  Co-morbidities that complicate the patient evaluation: History of chronic decubitus wound and indwelling Foley at high risk of recurring infections  Additional history obtained from the patient's wife at bedside  External records from outside source obtained and reviewed including hospital course and discharge summary  I ordered and personally interpreted labs.  The pertinent results include: UA off of indwelling Foley catheter showing large leukocytes and many bacteria, but negative nitrites.  Urine culture will be added on.  White blood cell count is 16.2 per chronically elevated.  CMP shows an anion gap of 17, potassium 3.4, creatinine near baseline levels of 3.29.  Lipase within normal limits.  No significant transaminitis noted  I ordered imaging studies including CT head, CT abdomen pelvis without contrast, x-ray of the chest I independently visualized and interpreted imaging which showed no evident intrathoracic process, but did  question atelectasis versus left lower lobe infiltrate; large stool burden noted; stable appearance of sacral decubitus ulceration and chronic osteomyelitic changes  The patient was maintained on a cardiac monitor.  I personally viewed and interpreted the cardiac monitored which showed an underlying rhythm of: Sinus tachycardia and sinus rhythm  Per my interpretation the patient's ECG shows no acute ischemic findings  I ordered medication including IV Pepcid, Zofran and Phenergan for nausea, IV fluid bolus for hydration, 500 cc  I have reviewed the patients home medicines and have made adjustments as needed  Test Considered: Low suspicion for acute meningitis, no indication for lumbar puncture  After the interventions noted above, I reevaluated the patient and found that they have: stayed the same  Dispostion:  After consideration of the diagnostic results and the patients response to treatment, I feel that the patent would benefit from medical admission.  Patient was due for dialysis today at 11:00 as an outpatient, is not requiring an emergently in the ED but I will speak to nephrology about having this performed in the hospital.  He will be admitted for acute COVID diagnosis, will discuss antiviral therapy with the hospitalist.  I do not see clear evidence of sepsis at this time, I suspect he likely has a viral illness, and I would avoid additional antibiotics without a clear source of infection.  Urine culture was added on.  He is on chronic Augmentin ppx.  Update, discussion the hospitalist have agreed to start empiric treatment with antibiotics for potential aspiration pneumonia or UTI.  Antibiotics to be ordered by hospitalist.  No clear evidence of sepsis otherwise.        Final Clinical Impression(s) /  ED Diagnoses Final diagnoses:  COVID-19  Intractable nausea    Rx / DC Orders ED Discharge Orders     None         Terald Sleeper, MD 11/01/23 1124

## 2023-11-01 NOTE — ED Provider Triage Note (Signed)
Emergency Medicine Provider Triage Evaluation Note  Bruce Little , a 52 y.o. male  was evaluated in triage.  Pt complains of intractable nausea and vomiting since approximately 1 AM.  Wife at bedside reports brown emesis with some flecks and she is concerned for coffee grounds.  Patient is bedbound from previous stroke and states he has frequent nausea and vomiting anytime he moves around but never this severe.  Feels he is having vertigo with room spinning dizziness and intractable nausea and vomiting.  Denies abdominal pain.  Denies chest pain or shortness of breath.  Denies constipation.  Last bowel was 2 days ago.  Does have a G-tube in place from previous stroke but this is not being used for medications or food currently.  Past medical history is complex patient is bedbound from previous stroke.  Has craniectomy without flap replaced, diabetes, hypertension, sacral wound with wound VAC, left lower extremity amputation, Foley catheter..  Review of Systems  Positive: Nausea, vomiting, room spinning dizziness Negative: Abdominal pain, chest pain  Physical Exam  BP 132/79 (BP Location: Right Arm)   Pulse 98   Temp 98.5 F (36.9 C) (Oral)   Resp (!) 21   Ht 6\' 3"  (1.905 m)   Wt 104.3 kg   SpO2 99%   BMI 28.75 kg/m  Gen:   Awake, no distress   Resp:  Normal effort  MSK:   Bedbound from previous stroke.  Able to move right arm and minimally move right leg. Other:  Abdomen soft, G-tube in place  Medical Decision Making  Medically screening exam initiated at 6:16 AM.  Appropriate orders placed.  Bruce Little was informed that the remainder of the evaluation will be completed by another provider, this initial triage assessment does not replace that evaluation, and the importance of remaining in the ED until their evaluation is complete.     Bruce Octave, MD 11/01/23 [phone]

## 2023-11-01 NOTE — ED Triage Notes (Signed)
BIB EMS - N/V since yesterday, denies abd pain. EMS gave 4mg  Zofran IV. Denies diarrhea.   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 Bed bound.

## 2023-11-01 NOTE — Consult Note (Addendum)
[facility] Renal Consultation Note    Indication for Consultation:  Management of ESRD/hemodialysis; anemia, hypertension/volume and secondary hyperparathyroidism PCP: Westley Hummer, MD   HPI: Bruce Little is a 52 y.o. male with ESRD on hemodialysis T,Th,S at [facility]. 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 He presented to ED this AM with C/O nausea and vomiting. He has been admitted for COVID. CXR unremarkable. Na 134 K+ 3.4  Co2 27 SCr 3.29 WBC 16.2 HGB 10.0 Lactic acid 0.8.   Seen in room with wife present. Patient not speaking but makes eye contact. He looks much better than when last seen. He does not appear to be in acute distress. He seem dry, discussed fluid strategy with primary at bedside. Will replete K+ and give NS with HD today, run even.    Past Medical History:  Diagnosis Date   A-fib (HCC)    Anemia    low iron   Chronic kidney disease    COVID    has had it 2 times, one mild and one wasn't   DM2 (diabetes mellitus, type 2) (HCC)    History of blood transfusion    HTN (hypertension)    Osteomyelitis of fifth toe of left foot (HCC) 08/13/2021   Osteomyelitis of fourth toe of left foot (HCC) 08/13/2021   Pneumonia    Stroke (HCC) 05/19/2021   no residual effects.   Stroke [facility])    Past Surgical History:  Procedure Laterality Date   AMPUTATION Left 08/29/2021   Procedure: AMPUTATION OF FOURTH TOE AND RAY ALONG WITH REMAINING FITH METATARSAL;  Surgeon: Tarry Kos, MD;  Location: [facility] OR;  Service: Orthopedics;  Laterality: Left;   AMPUTATION Left 09/03/2021   Procedure: LISFRANC AMPUTATION;  Surgeon: Tarry Kos, MD;  Location: [facility] OR;  Service: Orthopedics;  Laterality: Left;   AMPUTATION Left 10/29/2021   Procedure: AMPUTATION BELOW KNEE -LEFT;   Surgeon: Tarry Kos, MD;  Location: [facility] OR;  Service: Orthopedics;  Laterality: Left;   APPLICATION OF WOUND VAC Left 07/07/2021   Procedure: APPLICATION OF WOUND VAC;  Surgeon: Tarry Kos, MD;  Location: [facility] OR;  Service: Orthopedics;  Laterality: Left;   APPLICATION OF WOUND VAC Left 08/29/2021   Procedure: APPLICATION OF WOUND VAC;  Surgeon: Tarry Kos, MD;  Location: [facility] OR;  Service: Orthopedics;  Laterality: Left;   I & D EXTREMITY Left 07/03/2021   Procedure: IRRIGATION AND DEBRIDEMENT ,FIFTH RAY  AMPUTATION LEFT FOOT, , WOUND VAC PLACEMENT;  Surgeon: Tarry Kos, MD;  Location: [facility] OR;  Service: Orthopedics;  Laterality: Left;   I & D EXTREMITY Left 07/07/2021   Procedure: IRRIGATION AND DEBRIDEMENT LEFT FOOT;  Surgeon: Tarry Kos, MD;  Location: [facility] OR;  Service: Orthopedics;  Laterality: Left;   I & D EXTREMITY Left 08/29/2021   Procedure: IRRIGATION AND DEBRIDEMENT LEFT FOOT;  Surgeon: Tarry Kos, MD;  Location: [facility] OR;  Service: Orthopedics;  Laterality: Left;   IR FLUORO GUIDE CV LINE RIGHT  07/09/2021   IR REMOVAL TUN CV CATH W/O FL  10/08/2021   IR REMOVAL TUN CV CATH W/O FL  07/13/2023   IR REPLACE G-TUBE SIMPLE WO FLUORO  09/20/2023   IR US GUIDE VASC ACCESS RIGHT  07/09/2021   VITRECTOMY Left    [place]  [facility]   Family History  Problem Relation Age of Onset   Stroke Mother    Cancer Mother    Heart disease Father    Social History:  reports that he has never smoked. He has never used smokeless tobacco. He reports that he does not currently use alcohol. He reports that he does not use drugs. No Known Allergies Prior to Admission medications   Medication Sig Start Date End Date Taking? Authorizing Provider  albuterol (VENTOLIN HFA) 108 (90 Base) MCG/ACT inhaler Inhale 2 puffs into the lungs every 4 (four) hours as needed. 09/15/23   [provider]  amiodarone (PACERONE) 100 MG tablet Take 100 mg by mouth daily.    [provider]  amLODipine  (NORVASC) 10 MG tablet Take 10 mg by mouth daily.    [provider]  ascorbic acid (VITAMIN C) 500 MG tablet Take 1 tablet (500 mg total) by mouth 2 (two) times daily. 10/02/23   [name], MD  atorvastatin (LIPITOR) 40 MG tablet Take 40 mg by mouth at bedtime.    [provider]  carvedilol (COREG) 25 MG tablet Take 1 tablet (25 mg total) by mouth 2 (two) times daily with a meal. 10/02/23   [name], MD  cloNIDine (CATAPRES) 0.1 MG tablet Take 1 tablet (0.1 mg total) by mouth daily. 10/02/23   [name], MD  feeding supplement (ENSURE ENLIVE / ENSURE PLUS) LIQD Take 237 mLs by mouth 2 (two) times daily between meals. 10/02/23   [name], MD  finasteride (PROSCAR) 5 MG tablet Take 5 mg by mouth daily.    [provider]  food thickener (SIMPLYTHICK, NECTAR/LEVEL 2/MILDLY THICK,) GEL Take 1 packet by mouth as needed. 10/02/23   [name], MD  furosemide (LASIX) 40 MG tablet Take 40 mg by mouth daily.    [provider]  glipiZIDE (GLUCOTROL) 5 MG tablet Take 1 tablet (5 mg total) by mouth daily. 05/21/21 05/21/22  Marguerita Merles Latif, DO  hydrALAZINE (APRESOLINE) 25 MG tablet Take 25 mg by mouth every 8 (eight) hours.    [provider]  LANTUS SOLOSTAR 100 UNIT/ML Solostar Pen Inject 5 Units into the skin daily. 10/02/23   [name], MD  leptospermum manuka honey (MEDIHONEY) PSTE paste Apply 1 Application topically daily. 10/02/23 11/01/23  [name], MD  levETIRAcetam (KEPPRA) 750 MG tablet Take 1,500 mg by mouth 2 (two) times daily.    [provider]  losartan (COZAAR) 50 MG tablet Take 1 tablet (50 mg total) by mouth daily. 10/02/23   [name], MD  Melatonin 10 MG TABS Take 1 tablet by mouth at bedtime.    [provider]  methocarbamol (ROBAXIN) 500 MG tablet Take 1 tablet (500 mg total) by mouth every 8 (eight) hours as needed for muscle  spasms. 10/02/23   [name], MD  multivitamin (RENA-VIT) TABS tablet Take 1 tablet by mouth at bedtime. 10/02/23   [name], MD  nutrition supplement, JUVEN, [employer]) PACK Take 1 packet by mouth 2 (two) times daily between meals. 10/02/23   [name], MD  nystatin (MYCOSTATIN) 100000 UNIT/ML suspension Take 5 mLs by mouth 4 (four) times daily. 09/16/23   [provider]  ondansetron (ZOFRAN) 4 MG tablet Take 1 tablet (4 mg total) by mouth every 8 (eight) hours as needed for nausea or vomiting. 10/08/21   Cristie Hem, PA-C  ondansetron (ZOFRAN-ODT) 4 MG disintegrating  tablet Take 4 mg by mouth every 4 (four) hours as needed. 10/12/23   [provider]  pantoprazole (PROTONIX) 40 MG tablet Take 1 tablet (40 mg total) by mouth daily. Patient taking differently: Take 40 mg by mouth 2 (two) times daily. 05/22/21   Marguerita Merles Latif, DO  PARoxetine (PAXIL) 20 MG tablet Take 10 mg by mouth daily.    [provider]  terazosin (HYTRIN) 1 MG capsule Take 1 mg by mouth at bedtime.    [provider]  Vitamin D, Ergocalciferol, (DRISDOL) 1.25 MG (50000 UNIT) CAPS capsule Take 1 capsule (50,000 Units total) by mouth every 7 (seven) days. 10/03/23   [name], MD   Current Facility-Administered Medications  Medication Dose Route Frequency Provider Last Rate Last Admin   leptospermum manuka honey (MEDIHONEY) paste 1 Application  1 Application Topical Daily Trifan, Kermit Balo, MD       promethazine (PHENERGAN) 12.5 mg in sodium chloride 0.9 % 50 mL IVPB  12.5 mg Intravenous Q8H PRN Terald Sleeper, MD   Stopped at 11/01/23 1025   Current Outpatient Medications  Medication Sig Dispense Refill   albuterol (VENTOLIN HFA) 108 (90 Base) MCG/ACT inhaler Inhale 2 puffs into the lungs every 4 (four) hours as needed.     amiodarone (PACERONE) 100 MG tablet Take 100 mg by mouth daily.     amLODipine (NORVASC) 10 MG tablet Take 10 mg by  mouth daily.     ascorbic acid (VITAMIN C) 500 MG tablet Take 1 tablet (500 mg total) by mouth 2 (two) times daily. 60 tablet 0   atorvastatin (LIPITOR) 40 MG tablet Take 40 mg by mouth at bedtime.     carvedilol (COREG) 25 MG tablet Take 1 tablet (25 mg total) by mouth 2 (two) times daily with a meal. 60 tablet 0   cloNIDine (CATAPRES) 0.1 MG tablet Take 1 tablet (0.1 mg total) by mouth daily. 30 tablet 0   feeding supplement (ENSURE ENLIVE / ENSURE PLUS) LIQD Take 237 mLs by mouth 2 (two) times daily between meals. 237 mL 12   finasteride (PROSCAR) 5 MG tablet Take 5 mg by mouth daily.     food thickener (SIMPLYTHICK, NECTAR/LEVEL 2/MILDLY THICK,) GEL Take 1 packet by mouth as needed. 200 packet 0   furosemide (LASIX) 40 MG tablet Take 40 mg by mouth daily.     glipiZIDE (GLUCOTROL) 5 MG tablet Take 1 tablet (5 mg total) by mouth daily. 30 tablet 0   hydrALAZINE (APRESOLINE) 25 MG tablet Take 25 mg by mouth every 8 (eight) hours.     LANTUS SOLOSTAR 100 UNIT/ML Solostar Pen Inject 5 Units into the skin daily. 15 mL 0   leptospermum manuka honey (MEDIHONEY) PSTE paste Apply 1 Application topically daily. 180 mL 0   levETIRAcetam (KEPPRA) 750 MG tablet Take 1,500 mg by mouth 2 (two) times daily.     losartan (COZAAR) 50 MG tablet Take 1 tablet (50 mg total) by mouth daily. 30 tablet 0   Melatonin 10 MG TABS Take 1 tablet by mouth at bedtime.     methocarbamol (ROBAXIN) 500 MG tablet Take 1 tablet (500 mg total) by mouth every 8 (eight) hours as needed for muscle spasms. 30 tablet 0   multivitamin (RENA-VIT) TABS tablet Take 1 tablet by mouth at bedtime. 30 tablet 0   nutrition supplement, JUVEN, (JUVEN) PACK Take 1 packet by mouth 2 (two) times daily between meals. 60 packet 0   nystatin (MYCOSTATIN) 100000  UNIT/ML suspension Take 5 mLs by mouth 4 (four) times daily.     ondansetron (ZOFRAN) 4 MG tablet Take 1 tablet (4 mg total) by mouth every 8 (eight) hours as needed for nausea or vomiting. 40  tablet 0   ondansetron (ZOFRAN-ODT) 4 MG disintegrating tablet Take 4 mg by mouth every 4 (four) hours as needed.     pantoprazole (PROTONIX) 40 MG tablet Take 1 tablet (40 mg total) by mouth daily. (Patient taking differently: Take 40 mg by mouth 2 (two) times daily.) 30 tablet 0   PARoxetine (PAXIL) 20 MG tablet Take 10 mg by mouth daily.     terazosin (HYTRIN) 1 MG capsule Take 1 mg by mouth at bedtime.     Vitamin D, Ergocalciferol, (DRISDOL) 1.25 MG (50000 UNIT) CAPS capsule Take 1 capsule (50,000 Units total) by mouth every 7 (seven) days. 4 capsule 0   Labs: Basic Metabolic Panel: Recent Labs  Lab 11/01/23 0620  NA 134*  K 3.4*  CL 90*  CO2 27  GLUCOSE 122*  BUN 18  CREATININE 3.29*  CALCIUM 8.6*   Liver Function Tests: Recent Labs  Lab 11/01/23 0620  AST 27  ALT 28  ALKPHOS 120  BILITOT 0.7  PROT 6.7  ALBUMIN 2.0*   Recent Labs  Lab 11/01/23 0620  LIPASE 49   No results for input(s): "AMMONIA" in the last 168 hours. CBC: Recent Labs  Lab 11/01/23 0620  WBC 16.2*  NEUTROABS 14.3*  HGB 10.0*  HCT 30.6*  MCV 97.8  PLT 408*   Cardiac Enzymes: No results for input(s): "CKTOTAL", "CKMB", "CKMBINDEX", "TROPONINI" in the last 168 hours. CBG: No results for input(s): "GLUCAP" in the last 168 hours. Iron Studies: No results for input(s): "IRON", "TIBC", "TRANSFERRIN", "FERRITIN" in the last 72 hours. Studies/Results: CT ABDOMEN PELVIS WO CONTRAST Result Date: 11/01/2023 CLINICAL DATA:  Nausea, vomiting. EXAM: CT ABDOMEN AND PELVIS WITHOUT CONTRAST TECHNIQUE: Multidetector CT imaging of the abdomen and pelvis was performed following the standard protocol without IV contrast. RADIATION DOSE REDUCTION: This exam was performed according to the departmental dose-optimization program which includes automated exposure control, adjustment of the mA and/or kV according to patient size and/or use of iterative reconstruction technique. COMPARISON:  September 17, 2023.  FINDINGS: Lower chest: Minimal left lower lobe airspace opacity is noted suggesting possible inflammation or atelectasis. Hepatobiliary: No focal liver abnormality is seen. No gallstones, gallbladder wall thickening, or biliary dilatation. Pancreas: Unremarkable. No pancreatic ductal dilatation or surrounding inflammatory changes. Spleen: Normal in size without focal abnormality. Adrenals/Urinary Tract: Adrenal glands and kidneys appear normal. Urinary bladder is decompressed secondary to Foley catheter. Stomach/Bowel: Gastrostomy tube is in good position. There is no evidence of bowel obstruction or inflammation. The appendix appears normal. Large amount of stool seen in the rectum. Vascular/Lymphatic: Aortic atherosclerosis. No enlarged abdominal or pelvic lymph nodes. Reproductive: Prostate is unremarkable. Other: No ascites or hernia is noted. There is again noted large sacral decubitus ulcer although it is improved compared to prior exam. There is stable destruction of portions of the sacrum and coccyx related to chronic osteomyelitis. Musculoskeletal: Chronic findings as noted above. No acute osseous abnormality. IMPRESSION: Sacral decubitus ulcer is significantly smaller compared to prior exam, with stable destructive changes involving sacrum and coccyx consistent with chronic osteomyelitis. Gastrostomy tube is in grossly good position. Large amount of stool seen in rectum concerning for impaction. Minimal left lower lobe airspace opacity is noted suggesting possible inflammation or atelectasis. Aortic Atherosclerosis (ICD10-I70.0). Electronically Signed  By: Lupita Raider M.D.   On: 11/01/2023 08:42   CT Head Wo Contrast Result Date: 11/01/2023 CLINICAL DATA:  Head trauma, skull fracture or hematoma. EXAM: CT HEAD WITHOUT CONTRAST TECHNIQUE: Contiguous axial images were obtained from the base of the skull through the vertex without intravenous contrast. RADIATION DOSE REDUCTION: This exam was  performed according to the departmental dose-optimization program which includes automated exposure control, adjustment of the mA and/or kV according to patient size and/or use of iterative reconstruction technique. 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 The frontal parietal region is bulging outward at the craniectomy defect and anteriorly there is somewhat greater sunken curvature. No midline shift or hydrocephalus. No evidence of acute infarct, mass, or acute hemorrhage. Vascular: No acute finding. Skull: Normal. Negative for fracture or focal lesion. Sinuses/Orbits: No acute finding. IMPRESSION: No acute finding.  No hemorrhage or new infarct. Electronically Signed   By: Tiburcio Pea M.D.   On: 11/01/2023 08:33   DG ABD ACUTE 2+V W 1V CHEST Result Date: 11/01/2023 CLINICAL DATA:  Vomiting. EXAM: DG ABDOMEN ACUTE WITH 1 VIEW CHEST COMPARISON:  View chest x-ray 11/01/2023 FINDINGS: A right IJ dialysis catheter is stable. The heart size is normal. Lung volumes are low. No focal airspace disease is present. Bowel gas pattern is normal. No obstruction or free air is present. Mild degenerative changes are present in the lower lumbar spine. IMPRESSION: 1. No acute cardiopulmonary disease. 2. Normal bowel gas pattern. Electronically Signed   By: Marin Roberts M.D.   On: 11/01/2023 06:55    ROS: As per HPI otherwise negative.   Physical Exam: Vitals:   11/01/23 0543 11/01/23 0730 11/01/23 0800 11/01/23 0955  BP:  131/82 (!) 131/90   Pulse:  (!) 104 (!) 106   Resp:  19 18   Temp:    98.3 F (36.8 C)  TempSrc:    Oral  SpO2:  99% 98%   Weight: 104.3 kg     Height: 6\' 3"  (1.905 m)        General: Chronically ill appearing male, in no acute distress. Head: Normocephalic, atraumatic, sclera non-icteric, mucus membranes are moist Neck: Supple. JVD not elevated. Lungs: Clear bilaterally  to auscultation without wheezes, rales, or rhonchi. Breathing is unlabored. Heart: RRR with S1 S2. No murmurs, rubs, or gallops appreciated. Abdomen: Soft, NABS. Peg tube in place. Foley catheter patent to gravity Lower extremities: L BKA, No RLE edema Skin: Sacral decub with wound vac in place Neuro: Alert and oriented X 3. Moves all extremities spontaneously. Psych:  Responds to questions appropriately with a normal affect. Dialysis Access: [facility]  Dialysis Orders: [facility]: [place] T,[facility],S 4 hours 160NRe 82.2 kg 3.0 K/2.5 Ca TDC - No Heparin  - Mircera 100 mcg IV q 2 weeks - Hectorol 1 mcg IV TIW  Assessment/Plan:  Covid- Management per primary  N & V-TF on hold at present. Per primary  Hypokalemia-mild. Give 20 MEQ KCL per peg today. Use 3.0 K bath. Follow labs.   Leukocytosis- W/U per primary. Starting empiric ABX for possible UTI.   ESRD -  T,Th,S HD today and then resume regular schedule.   Hypertension/volume  - BP controlled. Patient appears dry. Will give 1 liter NS with HD today. Run even. Hold antihypertensive meds.   Anemia  - HGB 10.0 Follow trends  Metabolic bone disease -  OP calcium and PO4 at goal 10/28/2023. Continue  VDRa.   Nutrition - Peg feedings on hold.   DMT2 Management per primary  Trammell Bowden H. Manson Passey, NP-C 11/01/2023, 11:35 AM  [employer] [phone]

## 2023-11-01 NOTE — ED Notes (Signed)
Wound care at bedside.

## 2023-11-01 NOTE — H&P (Signed)
History and Physical    Patient: Bruce Little NFA:[id number] DOB: 02/03/1971 DOA: 11/01/2023 DOS: the patient was seen and examined on 11/01/2023 PCP: Street, Stephanie Coup, MD  Patient coming from: Home-with wife and home health services Medical readiness/disposition: Anticipate will be medically ready for discharge no later than 11/06/2023.  Will discharge home with wife and home health services  Chief Complaint:  Chief Complaint  Patient presents with   Emesis   HPI: Bruce Little is a 52 y.o. male with complex medical history significant of intracranial bleed status post emergent craniectomy in [place] June 2024.  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  He also has a history of diabetes mellitus on insulin, hypertension, dyslipidemia, dysphagia, atrial fibrillation on amiodarone.  Patient developed recurrent nausea and vomiting not typical of baseline over the past several days.  Emesis did have some flecks of blood noted and because of this the wife called EMS to bring the patient to the ED for further evaluation and treatment.  Nausea also not improved with use of home Zofran.  In the ER patient was afebrile and vital signs were stable.  Patient did have a white count of 16,200.  Chest x-ray unremarkable.  CT of the head without any acute changes.  CT abdomen and pelvis revealed sacral decubitus smaller than prior, G-tube in great position, large amount of stool seen in the rectum concerning for impaction and minimal left lower lobe airspace opacity.  Since this imaging has been obtained the EDP reported the patient had a very large bowel movement.  Patient was also found to be positive for COVID-19  infection.  Hospitalist service has been asked to evaluate the patient for admission.  Patient was do hemodialysis today and the nephrology team has been consulted to the patient's chronic kidney disease.  Review of Systems: As mentioned in the history of present illness. All other systems reviewed and are negative. Past Medical History:  Diagnosis Date   A-fib (HCC)    Anemia    low iron   Chronic kidney disease    COVID    has had it 2 times, one mild and one wasn't   DM2 (diabetes mellitus, type 2) (HCC)    History of blood transfusion    HTN (hypertension)    Osteomyelitis of fifth toe of left foot (HCC) 08/13/2021   Osteomyelitis of fourth toe of left foot (HCC) 08/13/2021   Pneumonia    Stroke (HCC) 05/19/2021   no residual effects.   Stroke [facility])    Past Surgical History:  Procedure Laterality Date   AMPUTATION Left 08/29/2021   Procedure: AMPUTATION OF FOURTH TOE AND RAY ALONG WITH REMAINING FITH METATARSAL;  Surgeon: Tarry Kos, MD;  Location: [facility] OR;  Service: Orthopedics;  Laterality: Left;   AMPUTATION Left 09/03/2021   Procedure: LISFRANC AMPUTATION;  Surgeon: Tarry Kos, MD;  Location: [facility] OR;  Service: Orthopedics;  Laterality: Left;   AMPUTATION Left 10/29/2021   Procedure: AMPUTATION BELOW KNEE -LEFT;  Surgeon: Tarry Kos, MD;  Location: [facility] OR;  Service: Orthopedics;  Laterality: Left;   APPLICATION OF WOUND VAC Left 07/07/2021   Procedure: APPLICATION OF WOUND VAC;  Surgeon: Tarry Kos, MD;  Location: [facility] OR;  Service: Orthopedics;  Laterality: Left;   APPLICATION OF WOUND VAC Left 08/29/2021   Procedure: APPLICATION OF WOUND VAC;  Surgeon: Tarry Kos, MD;  Location: [facility] OR;  Service: Orthopedics;  Laterality: Left;   I & D EXTREMITY Left 07/03/2021   Procedure: IRRIGATION AND DEBRIDEMENT ,FIFTH RAY  AMPUTATION LEFT FOOT, , WOUND VAC PLACEMENT;  Surgeon: Tarry Kos, MD;  Location: [facility] OR;  Service: Orthopedics;  Laterality: Left;   I & D EXTREMITY  Left 07/07/2021   Procedure: IRRIGATION AND DEBRIDEMENT LEFT FOOT;  Surgeon: Tarry Kos, MD;  Location: [facility] OR;  Service: Orthopedics;  Laterality: Left;   I & D EXTREMITY Left 08/29/2021   Procedure: IRRIGATION AND DEBRIDEMENT LEFT FOOT;  Surgeon: Tarry Kos, MD;  Location: [facility] OR;  Service: Orthopedics;  Laterality: Left;   IR FLUORO GUIDE CV LINE RIGHT  07/09/2021   IR REMOVAL TUN CV CATH W/O FL  10/08/2021   IR REMOVAL TUN CV CATH W/O FL  07/13/2023   IR REPLACE G-TUBE SIMPLE WO FLUORO  09/20/2023   IR US GUIDE VASC ACCESS RIGHT  07/09/2021   VITRECTOMY Left    [facility]   Social History:  reports that he has never smoked. He has never used smokeless tobacco. He reports that he does not currently use alcohol. He reports that he does not use drugs.  No Known Allergies  Family History  Problem Relation Age of Onset   Stroke Mother    Cancer Mother    Heart disease Father     Prior to Admission medications   Medication Sig Start Date End Date Taking? Authorizing Provider  amoxicillin-clavulanate (AUGMENTIN) 875-125 MG tablet Take 1 tablet by mouth 2 (two) times daily. 10/25/23  Yes [provider]  albuterol (VENTOLIN HFA) 108 (90 Base) MCG/ACT inhaler Inhale 2 puffs into the lungs every 4 (four) hours as needed. 09/15/23   [provider]  amiodarone (PACERONE) 100 MG tablet Take 100 mg by mouth daily.    [provider]  amLODipine (NORVASC) 10 MG tablet Take 10 mg by mouth daily.    [provider]  ascorbic acid (VITAMIN C) 500 MG tablet Take 1 tablet (500 mg total) by mouth 2 (two) times daily. 10/02/23   [name], MD  atorvastatin (LIPITOR) 40 MG tablet Take 40 mg by mouth at bedtime.    [provider]  carvedilol (COREG) 25 MG tablet Take 1 tablet (25 mg total) by mouth 2 (two) times daily with a meal. 10/02/23   [name], MD  cloNIDine (CATAPRES) 0.1 MG tablet Take 1 tablet (0.1 mg total) by mouth  daily. 10/02/23   [name], MD  feeding supplement (ENSURE ENLIVE / ENSURE PLUS) LIQD Take 237 mLs by mouth 2 (two) times daily between meals. 10/02/23   [name], MD  finasteride (PROSCAR) 5 MG tablet Take 5 mg by mouth daily.    [provider]  food thickener (SIMPLYTHICK, NECTAR/LEVEL 2/MILDLY THICK,) GEL Take 1 packet by mouth as needed. 10/02/23   [name], MD  furosemide (LASIX) 40 MG tablet Take 40 mg by mouth daily.    [provider]  glipiZIDE (GLUCOTROL) 5 MG tablet Take 1 tablet (5 mg total) by mouth daily. 05/21/21 05/21/22  Marguerita Merles Latif, DO  hydrALAZINE (APRESOLINE) 25 MG tablet Take 25 mg by mouth every 8 (eight) hours.    [provider]  LANTUS SOLOSTAR 100  UNIT/ML Solostar Pen Inject 5 Units into the skin daily. 10/02/23   [name], MD  leptospermum manuka honey (MEDIHONEY) PSTE paste Apply 1 Application topically daily. 10/02/23 11/01/23  [name], MD  levETIRAcetam (KEPPRA) 750 MG tablet Take 1,500 mg by mouth 2 (two) times daily.    [provider]  losartan (COZAAR) 50 MG tablet Take 1 tablet (50 mg total) by mouth daily. 10/02/23   [name], MD  Melatonin 10 MG TABS Take 1 tablet by mouth at bedtime.    [provider]  methocarbamol (ROBAXIN) 500 MG tablet Take 1 tablet (500 mg total) by mouth every 8 (eight) hours as needed for muscle spasms. 10/02/23   [name], MD  multivitamin (RENA-VIT) TABS tablet Take 1 tablet by mouth at bedtime. 10/02/23   [name], MD  nutrition supplement, JUVEN, [employer]) PACK Take 1 packet by mouth 2 (two) times daily between meals. 10/02/23   [name], MD  nystatin (MYCOSTATIN) 100000 UNIT/ML suspension Take 5 mLs by mouth 4 (four) times daily. 09/16/23   [provider]  ondansetron (ZOFRAN) 4 MG tablet Take 1 tablet (4 mg total) by mouth every 8 (eight) hours as needed for nausea or  vomiting. 10/08/21   Cristie Hem, PA-C  ondansetron (ZOFRAN-ODT) 4 MG disintegrating tablet Take 4 mg by mouth every 4 (four) hours as needed. 10/12/23   [provider]  pantoprazole (PROTONIX) 40 MG tablet Take 1 tablet (40 mg total) by mouth daily. Patient taking differently: Take 40 mg by mouth 2 (two) times daily. 05/22/21   Marguerita Merles Latif, DO  PARoxetine (PAXIL) 20 MG tablet Take 10 mg by mouth daily.    [provider]  terazosin (HYTRIN) 1 MG capsule Take 1 mg by mouth at bedtime.    [provider]  Vitamin D, Ergocalciferol, (DRISDOL) 1.25 MG (50000 UNIT) CAPS capsule Take 1 capsule (50,000 Units total) by mouth every 7 (seven) days. 10/03/23   [name], MD    Physical Exam: Vitals:   11/01/23 0730 11/01/23 0800 11/01/23 0955 11/01/23 1259  BP: 131/82 (!) 131/90  137/86  Pulse: (!) 104 (!) 106  98  Resp: 19 18  20   Temp:   98.3 F (36.8 C) 98.2 F (36.8 C)  TempSrc:   Oral Oral  SpO2: 99% 98%  100%  Weight:      Height:       Constitutional: NAD, calm, comfortable ENMT: Mucous membranes are dry.  Respiratory: clear to auscultation but coarse to auscultation bilaterally on posterior exam, no wheezing, no crackles. Normal respiratory effort. No accessory muscle use.  Room air Cardiovascular: Regular rate and rhythm, no murmurs / rubs / gallops. No extremity edema. 2+ pedal pulses.  Abdomen: no tenderness, no masses palpated. No hepatosplenomegaly. Bowel sounds positive.  PEG insertion site unremarkable Musculoskeletal: no clubbing / cyanosis. No joint deformity upper and lower extremities. Good ROM, no contractures.  Prior right BKA Skin: no rashes, lesions, ulcers. No induration Neurologic: CN 2-12 grossly intact set for known previously documented dysphagia. Sensation intact,Strength 3/5 upper/lower extremities on left.  Right dense hemiparesis Psychiatric: Alert and oriented x 3. Normal mood.    Data Reviewed:  Sodium 134,  potassium 3.4, chloride 90, CO2 27, glucose 122, BUN 18, creatinine 3.29, anion gap 17, albumin 2.0, LFTs normal  Troponin 16  Lactic acid 0.8  White count 16,200 with left shift neutrophils are 88% and immature neutrophils are 14.3  PCR positive for COVID, RSV and influenza negative  Urinalysis abnormal and concerning for possible urinary tract infection noting cloudy appearance, amber color, large leukocytes, greater than 300 protein, many bacteria, greater than 50 WBCs.  Urine culture has been obtained  Assessment and Plan: Acute COVID-19 infection No hypoxemia and no focal infiltrates no indication to initiate steroids Patient with nausea and vomiting so will not initiate Paxlovid Suspect worsening of nausea and vomiting from baseline secondary to COVID infection Appropriate isolation for the next 15 days Continue to monitor pulse oximetry in the event patient does develop respiratory symptoms  Recurrent nausea and vomiting Worse than baseline noting this typically only occurs with position changes Scheduled Zofran Hold oral medications for now and only initiate necessary medications via the IV route As needed Phenergan for breakthrough nausea and vomiting Clear liquids as tolerated Free water 200 cc per tube every 6 hours  History of prior cranial hemorrhage and craniectomy Resume home Keppra but utilize IV dosing Baseline has right hemiplegia and is bedbound IV Robaxin for muscle spasms  CKD 5 on dialysis MWF schedule Appreciate nephrology assistance Currently does not appear volume overloaded and is actually hypokalemic secondary to GI losses  Abnormal urinalysis in context of chronic Foley catheter Patient did present with leukocytosis so we will initiate empiric antibiotics with meropenem and pharmacy dosing Patient had Pseudomonas UTI during previous hospitalization If urine culture does not demonstrate any growth will need to discontinue  antibiotics  Hypertension Currently holding home oral medications IV Apresoline as needed SBP >160  History of atrial fibrillation No longer on Eliquis Holding carvedilol as well as low-dose amiodarone given recurrent nausea and vomiting EKG reveals sinus rhythm with QTc 482 ms  Diabetes mellitus 2 on insulin Continue home insulin glargine Hold oral hypoglycemics Follow CBGs and provide SSI AC/at bedtime  Dysphagia/moderate calorie malnutrition On a D3 diet at home with nectar thick liquids Wife states primarily using PEG tube for supplemental fluids if liquid intake poor and if needed can use for medications Clear liquids now in context of ongoing nausea with vomiting Nutrition consultation Advance diet once nausea and vomiting have resolved BMI 28    Advance Care Planning:   Code Status: Full Code   VTE prophylaxis: SCDs  Consults: Nephrology  Family Communication: Wife at bedside  Severity of Illness: The appropriate patient status for this patient is INPATIENT. Inpatient status is judged to be reasonable and necessary in order to provide the required intensity of service to ensure the patient's safety. The patient's presenting symptoms, physical exam findings, and initial radiographic and laboratory data in the context of their chronic comorbidities is felt to place them at high risk for further clinical deterioration. Furthermore, it is not anticipated that the patient will be medically stable for discharge from the hospital within 2 midnights of admission.   * I certify that at the point of admission it is my clinical judgment that the patient will require inpatient hospital care spanning beyond 2 midnights from the point of admission due to high intensity of service, high risk for further deterioration and high frequency of surveillance required.*  Author: Junious Silk, NP 11/01/2023 1:56 PM  For on call review [email or web address].

## 2023-11-01 NOTE — ED Notes (Signed)
Lab to add on urine culture

## 2023-11-01 NOTE — Plan of Care (Signed)
  Problem: Education: Goal: Knowledge of risk factors and measures for prevention of condition will improve Outcome: Progressing   Problem: Coping: Goal: Psychosocial and spiritual needs will be supported Outcome: Progressing   Problem: Respiratory: Goal: Will maintain a patent airway Outcome: Progressing Goal: Complications related to the disease process, condition or treatment will be avoided or minimized Outcome: Progressing   Problem: Education: Goal: Knowledge of General Education information will improve Description: Including pain rating scale, medication(s)/side effects and non-pharmacologic comfort measures Outcome: Progressing   Problem: Health Behavior/Discharge Planning: Goal: Ability to manage health-related needs will improve Outcome: Progressing   Problem: Clinical Measurements: Goal: Ability to maintain clinical measurements within normal limits will improve Outcome: Progressing Goal: Will remain free from infection Outcome: Progressing Goal: Diagnostic test results will improve Outcome: Progressing Goal: Respiratory complications will improve Outcome: Progressing Goal: Cardiovascular complication will be avoided Outcome: Progressing   Problem: Activity: Goal: Risk for activity intolerance will decrease Outcome: Progressing   Problem: Nutrition: Goal: Adequate nutrition will be maintained Outcome: Progressing   Problem: Coping: Goal: Level of anxiety will decrease Outcome: Progressing   Problem: Elimination: Goal: Will not experience complications related to bowel motility Outcome: Progressing Goal: Will not experience complications related to urinary retention Outcome: Progressing   Problem: Pain Management: Goal: General experience of comfort will improve Outcome: Progressing   Problem: Safety: Goal: Ability to remain free from injury will improve Outcome: Progressing   Problem: Skin Integrity: Goal: Risk for impaired skin integrity will  decrease Outcome: Progressing   Problem: Education: Goal: Ability to describe self-care measures that may prevent or decrease complications (Diabetes Survival Skills Education) will improve Outcome: Progressing Goal: Individualized Educational Video(s) Outcome: Progressing   Problem: Coping: Goal: Ability to adjust to condition or change in health will improve Outcome: Progressing   Problem: Fluid Volume: Goal: Ability to maintain a balanced intake and output will improve Outcome: Progressing   Problem: Health Behavior/Discharge Planning: Goal: Ability to identify and utilize available resources and services will improve Outcome: Progressing Goal: Ability to manage health-related needs will improve Outcome: Progressing   Problem: Metabolic: Goal: Ability to maintain appropriate glucose levels will improve Outcome: Progressing   Problem: Nutritional: Goal: Maintenance of adequate nutrition will improve Outcome: Progressing Goal: Progress toward achieving an optimal weight will improve Outcome: Progressing   Problem: Skin Integrity: Goal: Risk for impaired skin integrity will decrease Outcome: Progressing   Problem: Tissue Perfusion: Goal: Adequacy of tissue perfusion will improve Outcome: Progressing

## 2023-11-01 NOTE — ED Notes (Addendum)
Patient transported to CT 

## 2023-11-01 NOTE — ED Notes (Signed)
Patient changed by this RN and NT

## 2023-11-02 DIAGNOSIS — R110 Nausea: Secondary | ICD-10-CM

## 2023-11-02 DIAGNOSIS — Z794 Long term (current) use of insulin: Secondary | ICD-10-CM

## 2023-11-02 DIAGNOSIS — U071 COVID-19: Secondary | ICD-10-CM | POA: Diagnosis not present

## 2023-11-02 DIAGNOSIS — K5900 Constipation, unspecified: Secondary | ICD-10-CM | POA: Diagnosis not present

## 2023-11-02 DIAGNOSIS — E119 Type 2 diabetes mellitus without complications: Secondary | ICD-10-CM

## 2023-11-02 LAB — RENAL FUNCTION PANEL
Albumin: 1.9 g/dL — ABNORMAL LOW (ref 3.5–5.0)
Anion gap: 11 (ref 5–15)
BUN: 20 mg/dL (ref 6–20)
CO2: 28 mmol/L (ref 22–32)
Calcium: 8.5 mg/dL — ABNORMAL LOW (ref 8.9–10.3)
Chloride: 94 mmol/L — ABNORMAL LOW (ref 98–111)
Creatinine, Ser: 3.53 mg/dL — ABNORMAL HIGH (ref 0.61–1.24)
GFR, Estimated: 20 mL/min — ABNORMAL LOW (ref >60)
Glucose, Bld: 111 mg/dL — ABNORMAL HIGH (ref 70–99)
Phosphorus: 4 mg/dL (ref 2.5–4.6)
Potassium: 3.2 mmol/L — ABNORMAL LOW (ref 3.5–5.1)
Sodium: 133 mmol/L — ABNORMAL LOW (ref 135–145)

## 2023-11-02 LAB — GLUCOSE, CAPILLARY
Glucose-Capillary: 92 mg/dL (ref 70–99)
Glucose-Capillary: 96 mg/dL (ref 70–99)
Glucose-Capillary: 112 mg/dL — ABNORMAL HIGH (ref 70–99)
Glucose-Capillary: 90 mg/dL (ref 70–99)

## 2023-11-02 LAB — VITAMIN D 25 HYDROXY (VIT D DEFICIENCY, FRACTURES): Vit D, 25-Hydroxy: 83.1 ng/mL (ref 30–100)

## 2023-11-02 LAB — MAGNESIUM: Magnesium: 1.9 mg/dL (ref 1.7–2.4)

## 2023-11-02 LAB — HEPATITIS B SURFACE ANTIBODY, QUANTITATIVE: Hep B S AB Quant (Post): <3.5 m[IU]/mL — ABNORMAL LOW

## 2023-11-02 MED ORDER — HEPARIN SODIUM (PORCINE) 5000 UNIT/ML IJ SOLN
5000.0000 [IU] | Freq: Three times a day (TID) | INTRAMUSCULAR | Status: DC
  Administered 2023-11-02 – 2023-11-03 (×3): 5000 [IU] via SUBCUTANEOUS
  Filled 2023-11-02 (×3): qty 1

## 2023-11-02 MED ORDER — HEPARIN SODIUM (PORCINE) 1000 UNIT/ML DIALYSIS
1000.0000 [IU] | INTRAMUSCULAR | Status: DC | PRN
  Administered 2023-11-02: 3200 [IU]
  Filled 2023-11-02: qty 4

## 2023-11-02 MED ORDER — ANTICOAGULANT SODIUM CITRATE 4% (200MG/5ML) IV SOLN: 5.0000 mL | Status: DC | PRN

## 2023-11-02 MED ORDER — OXYCODONE HCL 5 MG PO TABS: 5.0000 mg | ORAL_TABLET | Freq: Four times a day (QID) | ORAL | Status: DC | PRN

## 2023-11-02 MED ORDER — POLYETHYLENE GLYCOL 3350 17 G PO PACK
17.0000 g | PACK | Freq: Two times a day (BID) | ORAL | Status: DC
  Administered 2023-11-02 (×2): 17 g via ORAL
  Filled 2023-11-02 (×3): qty 1

## 2023-11-02 MED ORDER — FLEET ENEMA RE ENEM
1.0000 | ENEMA | Freq: Once | RECTAL | Status: DC
  Filled 2023-11-02: qty 1

## 2023-11-02 MED ORDER — SENNOSIDES-DOCUSATE SODIUM 8.6-50 MG PO TABS
2.0000 | ORAL_TABLET | Freq: Two times a day (BID) | ORAL | Status: DC
  Administered 2023-11-02 – 2023-11-03 (×3): 2 via ORAL
  Filled 2023-11-02 (×3): qty 2

## 2023-11-02 MED ORDER — SODIUM CHLORIDE 0.9 % IV SOLN: INTRAVENOUS | Status: AC

## 2023-11-02 NOTE — Progress Notes (Addendum)
Bruce Little is an 52 y.o. male with ESRD on hemodialysis T,Th,S at [facility]. 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 He presented to ED this AM with C/O nausea and vomiting. He has been admitted for COVID. CXR unremarkable.    Dialysis Orders: Center: [facility] T,Th,S 4 hours 160NRe 82.2 kg 3.0 K/2.5 Ca TDC - No Heparin  - Mircera 100 mcg IV q 2 weeks - Hectorol 1 mcg IV TIW   Assessment/Plan:  Covid- Management per primary  N & V-TF on hold at present. Per primary  Hypokalemia-mild. Give 20 MEQ KCL per peg today. Will use 3.0 K bath this morning. Follow labs.   Leukocytosis- W/U per primary. Starting empiric ABX for possible UTI.   ESRD -  T,Th,S HD today; he is about to be hooked up on dialysis this morning bedside and then resume regular schedule.  -Seen on dialysis, 3K bath utilizing tunneled catheter.  Patient appears to be comfortable, hemodynamics are stable, no changes at this time.  Hypertension/volume  - BP controlled. Patient appears dry. Will give 1 liter NS with HD today. Run even. Hold antihypertensive meds.   Anemia  - HGB 10.0 Follow trends  Metabolic bone disease -  OP calcium and PO4 at goal 10/28/2023. Continue VDRa.   Nutrition - Peg feedings on hold.   DMT2 Management per primary  Subjective: More alert today and appears to be comfortable; patient about to be hooked up on dialysis.  He denies fever chills nausea vomiting or shortness of breath.   Chemistry and CBC: Creat  Date/Time Value Ref Range Status  10/13/2021 11:39 AM 2.73 (H) 0.60 - 1.29 mg/dL Final   Creatinine, Ser  Date/Time Value Ref Range Status  11/02/2023 04:54 AM 3.53 (H) 0.61 - 1.24 mg/dL Final  16/08/9603 54:09 AM 3.29 (H) 0.61 - 1.24 mg/dL Final  81/19/1478 29:56 AM 2.22 (H) 0.61 - 1.24 mg/dL Final   21/30/8657 84:69 PM 1.59 (H) 0.61 - 1.24 mg/dL Final    Comment:    DELTA CHECK NOTED  09/30/2023 05:53 AM 2.81 (H) 0.61 - 1.24 mg/dL Final  62/95/2841 32:44 AM 2.80 (H) 0.61 - 1.24 mg/dL Final  11/11/7251 66:44 AM 2.42 (H) 0.61 - 1.24 mg/dL Final  03/47/4259 56:38 AM 2.45 (H) 0.61 - 1.24 mg/dL Final  75/64/3329 51:88 AM 1.95 (H) 0.61 - 1.24 mg/dL Final  41/66/0630 16:01 AM 2.38 (H) 0.61 - 1.24 mg/dL Final  09/32/3557 32:20 PM 1.84 (H) 0.61 - 1.24 mg/dL Final  25/42/7062 37:62 PM 2.54 (H) 0.61 - 1.24 mg/dL Final  83/15/1761 60:73 AM 2.63 (H) 0.61 - 1.24 mg/dL Final    Comment:    DELTA CHECK NOTED  09/19/2023 04:52 AM 1.15 0.61 - 1.24 mg/dL Final  71/04/2693 85:46 AM 1.85 (H) 0.61 - 1.24 mg/dL Final  27/01/5008 38:18 AM 1.69 (H) 0.61 - 1.24 mg/dL Final  29/93/7169 67:89 AM 2.97 (H) 0.61 - 1.24 mg/dL Final  38/08/1750 02:58 AM 2.50 (H) 0.61 - 1.24 mg/dL Final  52/77/8242 35:36 AM 2.69 (H) 0.61 - 1.24 mg/dL Final  14/43/1540 08:67 AM 2.99 (H) 0.61 - 1.24 mg/dL Final  61/95/0932 67:12 AM 1.86 (H) 0.61 - 1.24 mg/dL Final  45/80/9983 38:25 AM 1.41 (H) 0.61 - 1.24 mg/dL Final  05/39/7673 41:93 AM 2.62 (H) 0.61 - 1.24 mg/dL Final  79/12/4095  04:42 AM 3.00 (H) 0.61 - 1.24 mg/dL Final  16/08/9603 54:09 AM 2.60 (H) 0.61 - 1.24 mg/dL Final  81/19/1478 29:56 AM 2.74 (H) 0.61 - 1.24 mg/dL Final  21/30/8657 84:69 AM 2.86 (H) 0.61 - 1.24 mg/dL Final  62/95/2841 32:44 AM 3.39 (H) 0.61 - 1.24 mg/dL Final  11/11/7251 66:44 AM 3.06 (H) 0.61 - 1.24 mg/dL Final  03/47/4259 56:38 AM 3.37 (H) 0.61 - 1.24 mg/dL Final  75/64/3329 51:88 AM 3.49 (H) 0.61 - 1.24 mg/dL Final  41/66/0630 16:01 AM 2.77 (H) 0.61 - 1.24 mg/dL Final  09/32/3557 32:20 AM 2.59 (H) 0.61 - 1.24 mg/dL Final  25/42/7062 37:62 AM 3.56 (H) 0.61 - 1.24 mg/dL Final  83/15/1761 60:73 PM 2.44 (H) 0.61 - 1.24 mg/dL Final  71/04/2693 85:46 AM 3.40 (H) 0.61 - 1.24 mg/dL Final  27/01/5008 38:18 PM 3.02 (H) 0.61 - 1.24 mg/dL Final  29/93/7169  67:89 AM 2.79 (H) 0.61 - 1.24 mg/dL Final  38/08/1750 02:58 PM 3.02 (H) 0.61 - 1.24 mg/dL Final  52/77/8242 35:36 PM 3.40 (H) 0.61 - 1.24 mg/dL Final  14/43/1540 08:67 AM 3.80 (H) 0.61 - 1.24 mg/dL Final  61/95/0932 67:12 AM 4.44 (H) 0.61 - 1.24 mg/dL Final  45/80/9983 38:25 AM 4.77 (H) 0.61 - 1.24 mg/dL Final  05/39/7673 41:93 AM 5.10 (H) 0.61 - 1.24 mg/dL Final  79/12/4095 35:32 AM 5.33 (H) 0.61 - 1.24 mg/dL Final  99/24/2683 41:96 AM 5.42 (H) 0.61 - 1.24 mg/dL Final  22/29/7989 21:19 AM 5.48 (H) 0.61 - 1.24 mg/dL Final  41/74/0814 48:18 AM 5.47 (H) 0.61 - 1.24 mg/dL Final  56/31/4970 26:37 AM 5.70 (H) 0.61 - 1.24 mg/dL Final  85/88/5027 74:12 PM 6.20 (H) 0.61 - 1.24 mg/dL Final  87/86/7672 09:47 AM 6.11 (H) 0.61 - 1.24 mg/dL Final  09/62/8366 29:47 AM 6.63 (H) 0.61 - 1.24 mg/dL Final   Recent Labs  Lab 11/01/23 0620 11/02/23 0454  NA 134* 133*  K 3.4* 3.2*  CL 90* 94*  CO2 27 28  GLUCOSE 122* 111*  BUN 18 20  CREATININE 3.29* 3.53*  CALCIUM 8.6* 8.5*  PHOS  --  4.0   Recent Labs  Lab 11/01/23 0620  WBC 16.2*  NEUTROABS 14.3*  HGB 10.0*  HCT 30.6*  MCV 97.8  PLT 408*   Liver Function Tests: Recent Labs  Lab 11/01/23 0620 11/02/23 0454  AST 27  --   ALT 28  --   ALKPHOS 120  --   BILITOT 0.7  --   PROT 6.7  --   ALBUMIN 2.0* 1.9*   Recent Labs  Lab 11/01/23 0620  LIPASE 49   No results for input(s): "AMMONIA" in the last 168 hours. Cardiac Enzymes: No results for input(s): "CKTOTAL", "CKMB", "CKMBINDEX", "TROPONINI" in the last 168 hours. Iron Studies: No results for input(s): "IRON", "TIBC", "TRANSFERRIN", "FERRITIN" in the last 72 hours. PT/INR: @LABRCNTIP (inr:5)  Xrays/Other Studies: ) Results for orders placed or performed during the hospital encounter of 11/01/23 (from the past 48 hours)  Lactic acid, plasma     Status: None   Collection Time: 11/01/23  6:18 AM  Result Value Ref Range   Lactic Acid, Venous 0.8 0.5 - 1.9 mmol/L    Comment:  Performed at [facility] Lab, [address], [place], [place] [address]  CBC with Differential     Status: Abnormal   Collection Time: 11/01/23  6:20 AM  Result Value Ref Range   WBC 16.2 (H) 4.0 - 10.5 K/uL  RBC 3.13 (L) 4.22 - 5.81 MIL/uL   Hemoglobin 10.0 (L) 13.0 - 17.0 g/dL   HCT 95.2 (L) 84.1 - 32.4 %   MCV 97.8 80.0 - 100.0 fL   MCH 31.9 26.0 - 34.0 pg   MCHC 32.7 30.0 - 36.0 g/dL   RDW 40.1 02.7 - 25.3 %   Platelets 408 (H) 150 - 400 K/uL   nRBC 0.0 0.0 - 0.2 %   Neutrophils Relative % 88 %   Neutro Abs 14.3 (H) 1.7 - 7.7 K/uL   Lymphocytes Relative 7 %   Lymphs Abs 1.1 0.7 - 4.0 K/uL   Monocytes Relative 4 %   Monocytes Absolute 0.7 0.1 - 1.0 K/uL   Eosinophils Relative 0 %   Eosinophils Absolute 0.0 0.0 - 0.5 K/uL   Basophils Relative 0 %   Basophils Absolute 0.0 0.0 - 0.1 K/uL   Immature Granulocytes 1 %   Abs Immature Granulocytes 0.11 (H) 0.00 - 0.07 K/uL    Comment: Performed at [facility] Lab, [address], [place], [place] [address]  Comprehensive metabolic panel     Status: Abnormal   Collection Time: 11/01/23  6:20 AM  Result Value Ref Range   Sodium 134 (L) 135 - 145 mmol/L   Potassium 3.4 (L) 3.5 - 5.1 mmol/L   Chloride 90 (L) 98 - 111 mmol/L   CO2 27 22 - 32 mmol/L   Glucose, Bld 122 (H) 70 - 99 mg/dL    Comment: Glucose reference range applies only to samples taken after fasting for at least 8 hours.   BUN 18 6 - 20 mg/dL   Creatinine, Ser 3.47 (H) 0.61 - 1.24 mg/dL   Calcium 8.6 (L) 8.9 - 10.3 mg/dL   Total Protein 6.7 6.5 - 8.1 g/dL   Albumin 2.0 (L) 3.5 - 5.0 g/dL   AST 27 15 - 41 U/L   ALT 28 0 - 44 U/L   Alkaline Phosphatase 120 38 - 126 U/L   Total Bilirubin 0.7 <1.2 mg/dL   GFR, Estimated 22 (L) >60 mL/min    Comment: (NOTE) Calculated using the CKD-EPI Creatinine Equation (2021)    Anion gap 17 (H) 5 - 15    Comment: Performed at [facility], [address] [place], [place] [address]  Lipase, blood     Status: None    Collection Time: 11/01/23  6:20 AM  Result Value Ref Range   Lipase 49 11 - 51 U/L    Comment: Performed at [facility] Lab, [address] [place], [place] [address]  Troponin I (High Sensitivity)     Status: None   Collection Time: 11/01/23  6:20 AM  Result Value Ref Range   Troponin I (High Sensitivity) 16 <18 ng/L    Comment: (NOTE) Elevated high sensitivity troponin I (hsTnI) values and significant  changes across serial measurements may suggest ACS but many other  chronic and acute conditions are known to elevate hsTnI results.  Refer to the "Links" section for chest pain algorithms and additional  guidance. Performed at [facility], [address] [place], [place] [address]   Urinalysis, Routine w reflex microscopic -Urine, Clean Catch     Status: Abnormal   Collection Time: 11/01/23  8:06 AM  Result Value Ref Range   Color, Urine AMBER (A) YELLOW    Comment: BIOCHEMICALS MAY BE AFFECTED BY COLOR   APPearance CLOUDY (A) CLEAR   Specific Gravity, Urine 1.016 1.005 -  1.030   pH 6.0 5.0 - 8.0   Glucose, UA NEGATIVE NEGATIVE mg/dL   Hgb urine dipstick NEGATIVE NEGATIVE   Bilirubin Urine NEGATIVE NEGATIVE   Ketones, ur NEGATIVE NEGATIVE mg/dL   Protein, ur >=161 (A) NEGATIVE mg/dL   Nitrite NEGATIVE NEGATIVE   Leukocytes,Ua LARGE (A) NEGATIVE   RBC / HPF 6-10 0 - 5 RBC/hpf   WBC, UA >50 0 - 5 WBC/hpf   Bacteria, UA MANY (A) NONE SEEN   Squamous Epithelial / HPF 0-5 0 - 5 /HPF   WBC Clumps PRESENT    Mucus PRESENT    Budding Yeast PRESENT     Comment: Performed at [facility], [address] [place], [place] [address]  Resp panel by RT-PCR (RSV, Flu A&B, Covid) Urine, Catheterized     Status: Abnormal   Collection Time: 11/01/23  8:06 AM   Specimen: Urine, Catheterized; Nasal Swab  Result Value Ref Range   SARS Coronavirus 2 by RT PCR POSITIVE (A) NEGATIVE   Influenza A by PCR NEGATIVE NEGATIVE   Influenza B by PCR NEGATIVE NEGATIVE    Comment:  (NOTE) The Xpert Xpress SARS-CoV-2/FLU/RSV plus assay is intended as an aid in the diagnosis of influenza from Nasopharyngeal swab specimens and should not be used as a sole basis for treatment. Nasal washings and aspirates are unacceptable for Xpert Xpress SARS-CoV-2/FLU/RSV testing.  Fact Sheet for Patients: BloggerCourse.com  Fact Sheet for Healthcare Providers: SeriousBroker.it  This test is not yet approved or cleared by the [place] FDA and has been authorized for detection and/or diagnosis of SARS-CoV-2 by FDA under an Emergency Use Authorization (EUA). This EUA will remain in effect (meaning this test can be used) for the duration of the COVID-19 declaration under Section 564(b)(1) of the Act, 21 U.S.C. section 360bbb-3(b)(1), unless the authorization is terminated or revoked.     Resp Syncytial Virus by PCR NEGATIVE NEGATIVE    Comment: (NOTE) Fact Sheet for Patients: BloggerCourse.com  Fact Sheet for Healthcare Providers: SeriousBroker.it  This test is not yet approved or cleared by the [place] FDA and has been authorized for detection and/or diagnosis of SARS-CoV-2 by FDA under an Emergency Use Authorization (EUA). This EUA will remain in effect (meaning this test can be used) for the duration of the COVID-19 declaration under Section 564(b)(1) of the Act, 21 U.S.C. section 360bbb-3(b)(1), unless the authorization is terminated or revoked.  Performed at [facility] Lab, [address], [place], [place] [address]   Glucose, capillary     Status: None   Collection Time: 11/01/23  1:25 PM  Result Value Ref Range   Glucose-Capillary 94 70 - 99 mg/dL    Comment: Glucose reference range applies only to samples taken after fasting for at least 8 hours.  C-reactive protein     Status: Abnormal   Collection Time: 11/01/23  1:57 PM  Result Value Ref Range    CRP 15.3 (H) <1.0 mg/dL    Comment: Performed at [facility] Lab, [address] [place], [place] [address]  D-dimer, quantitative     Status: Abnormal   Collection Time: 11/01/23  1:57 PM  Result Value Ref Range   D-Dimer, Quant 1.47 (H) 0.00 - 0.50 ug/mL-FEU    Comment: (NOTE) At the manufacturer cut-off value of 0.5 g/mL FEU, this assay has a negative predictive value of 95-100%.This assay is intended for use in conjunction with a clinical pretest probability (PTP) assessment model to exclude pulmonary embolism (  PE) and deep venous thrombosis (DVT) in outpatients suspected of PE or DVT. Results should be correlated with clinical presentation. Performed at [facility] Lab, [address] [place], [place] [address]   Hepatitis B surface antigen     Status: None   Collection Time: 11/01/23  1:57 PM  Result Value Ref Range   Hepatitis B Surface Ag NON REACTIVE NON REACTIVE    Comment: Performed at [facility], [address], [place], [place] [address]  Hepatitis B surface antibody,quantitative     Status: Abnormal   Collection Time: 11/01/23  1:57 PM  Result Value Ref Range   Hep B S AB Quant (Post) <3.5 (L) Immunity>10 mIU/mL    Comment: (NOTE)  Status of Immunity                     Anti-HBs Level  ------------------                     -------------- Inconsistent with Immunity                  0.0 - 10.0 Consistent with Immunity                         >10.0 Performed At: [facility] [address] [place], [place] [id number] Jolene Schimke MD WG:[id number]   Glucose, capillary     Status: Abnormal   Collection Time: 11/01/23  4:19 PM  Result Value Ref Range   Glucose-Capillary 105 (H) 70 - 99 mg/dL    Comment: Glucose reference range applies only to samples taken after fasting for at least 8 hours.  Glucose, capillary     Status: None   Collection Time: 11/01/23  8:43 PM  Result Value Ref Range   Glucose-Capillary 82 70 - 99 mg/dL    Comment:  Glucose reference range applies only to samples taken after fasting for at least 8 hours.  Renal function panel     Status: Abnormal   Collection Time: 11/02/23  4:54 AM  Result Value Ref Range   Sodium 133 (L) 135 - 145 mmol/L   Potassium 3.2 (L) 3.5 - 5.1 mmol/L   Chloride 94 (L) 98 - 111 mmol/L   CO2 28 22 - 32 mmol/L   Glucose, Bld 111 (H) 70 - 99 mg/dL    Comment: Glucose reference range applies only to samples taken after fasting for at least 8 hours.   BUN 20 6 - 20 mg/dL   Creatinine, Ser 7.84 (H) 0.61 - 1.24 mg/dL   Calcium 8.5 (L) 8.9 - 10.3 mg/dL   Phosphorus 4.0 2.5 - 4.6 mg/dL   Albumin 1.9 (L) 3.5 - 5.0 g/dL   GFR, Estimated 20 (L) >60 mL/min    Comment: (NOTE) Calculated using the CKD-EPI Creatinine Equation (2021)    Anion gap 11 5 - 15    Comment: Performed at [facility] Lab, [address], [place], [place] [address]  Magnesium     Status: None   Collection Time: 11/02/23  4:54 AM  Result Value Ref Range   Magnesium 1.9 1.7 - 2.4 mg/dL    Comment: Performed at [facility] Lab, [address], [place], [place] [address]  VITAMIN D 25 Hydroxy (Vit-D Deficiency, Fractures)     Status: None   Collection Time: 11/02/23  4:54 AM  Result Value Ref Range   Vit D, 25-Hydroxy 83.10 30 -  100 ng/mL    Comment: (NOTE) Vitamin D deficiency has been defined by the Institute of Medicine  and an Endocrine Society practice guideline as a level of serum 25-OH  vitamin D less than 20 ng/mL (1,2). The Endocrine Society went on to  further define vitamin D insufficiency as a level between 21 and 29  ng/mL (2).  1. IOM (Institute of Medicine). 2010. Dietary reference intakes for  calcium and D. [place]: [employer]. 2. Holick MF, Binkley Mercer, Bischoff-Ferrari HA, et al. Evaluation,  treatment, and prevention of vitamin D deficiency: an Endocrine  Society clinical practice guideline, JCEM. 2011 Jul; 96(7): 1911-30.  Performed at [facility]  Lab, [address] [place], [place] [address]    CT ABDOMEN PELVIS WO CONTRAST Result Date: 11/01/2023 CLINICAL DATA:  Nausea, vomiting. EXAM: CT ABDOMEN AND PELVIS WITHOUT CONTRAST TECHNIQUE: Multidetector CT imaging of the abdomen and pelvis was performed following the standard protocol without IV contrast. RADIATION DOSE REDUCTION: This exam was performed according to the departmental dose-optimization program which includes automated exposure control, adjustment of the mA and/or kV according to patient size and/or use of iterative reconstruction technique. COMPARISON:  September 17, 2023. FINDINGS: Lower chest: Minimal left lower lobe airspace opacity is noted suggesting possible inflammation or atelectasis. Hepatobiliary: No focal liver abnormality is seen. No gallstones, gallbladder wall thickening, or biliary dilatation. Pancreas: Unremarkable. No pancreatic ductal dilatation or surrounding inflammatory changes. Spleen: Normal in size without focal abnormality. Adrenals/Urinary Tract: Adrenal glands and kidneys appear normal. Urinary bladder is decompressed secondary to Foley catheter. Stomach/Bowel: Gastrostomy tube is in good position. There is no evidence of bowel obstruction or inflammation. The appendix appears normal. Large amount of stool seen in the rectum. Vascular/Lymphatic: Aortic atherosclerosis. No enlarged abdominal or pelvic lymph nodes. Reproductive: Prostate is unremarkable. Other: No ascites or hernia is noted. There is again noted large sacral decubitus ulcer although it is improved compared to prior exam. There is stable destruction of portions of the sacrum and coccyx related to chronic osteomyelitis. Musculoskeletal: Chronic findings as noted above. No acute osseous abnormality. IMPRESSION: Sacral decubitus ulcer is significantly smaller compared to prior exam, with stable destructive changes involving sacrum and coccyx consistent with chronic osteomyelitis. Gastrostomy tube is in  grossly good position. Large amount of stool seen in rectum concerning for impaction. Minimal left lower lobe airspace opacity is noted suggesting possible inflammation or atelectasis. Aortic Atherosclerosis (ICD10-I70.0). Electronically Signed   By: Lupita Raider M.D.   On: 11/01/2023 08:42   CT Head Wo Contrast Result Date: 11/01/2023 CLINICAL DATA:  Head trauma, skull fracture or hematoma. EXAM: CT HEAD WITHOUT CONTRAST TECHNIQUE: Contiguous axial images were obtained from the base of the skull through the vertex without intravenous contrast. RADIATION DOSE REDUCTION: This exam was performed according to the departmental dose-optimization program which includes automated exposure control, adjustment of the mA and/or kV according to patient size and/or use of iterative reconstruction technique. 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 The frontal parietal region is bulging outward at the craniectomy defect and anteriorly there is somewhat greater sunken curvature. No midline shift or hydrocephalus. No evidence of acute infarct, mass, or acute hemorrhage. Vascular: No acute finding. Skull: Normal. Negative for fracture or focal lesion. Sinuses/Orbits: No acute finding. IMPRESSION: No acute finding.  No hemorrhage or new infarct. Electronically Signed   By: Tiburcio Pea M.D.   On: 11/01/2023  08:33   DG ABD ACUTE 2+V W 1V CHEST Result Date: 11/01/2023 CLINICAL DATA:  Vomiting. EXAM: DG ABDOMEN ACUTE WITH 1 VIEW CHEST COMPARISON:  View chest x-ray 11/01/2023 FINDINGS: A right IJ dialysis catheter is stable. The heart size is normal. Lung volumes are low. No focal airspace disease is present. Bowel gas pattern is normal. No obstruction or free air is present. Mild degenerative changes are present in the lower lumbar spine. IMPRESSION: 1. No acute cardiopulmonary disease. 2. Normal bowel gas pattern.  Electronically Signed   By: Marin Roberts M.D.   On: 11/01/2023 06:55    PMH:   Past Medical History:  Diagnosis Date   A-fib (HCC)    Anemia    low iron   Chronic kidney disease    COVID    has had it 2 times, one mild and one wasn't   DM2 (diabetes mellitus, type 2) (HCC)    History of blood transfusion    HTN (hypertension)    Osteomyelitis of fifth toe of left foot (HCC) 08/13/2021   Osteomyelitis of fourth toe of left foot (HCC) 08/13/2021   Pneumonia    Stroke (HCC) 05/19/2021   no residual effects.   Stroke [facility])     PSH:   Past Surgical History:  Procedure Laterality Date   AMPUTATION Left 08/29/2021   Procedure: AMPUTATION OF FOURTH TOE AND RAY ALONG WITH REMAINING FITH METATARSAL;  Surgeon: Tarry Kos, MD;  Location: [facility] OR;  Service: Orthopedics;  Laterality: Left;   AMPUTATION Left 09/03/2021   Procedure: LISFRANC AMPUTATION;  Surgeon: Tarry Kos, MD;  Location: [facility] OR;  Service: Orthopedics;  Laterality: Left;   AMPUTATION Left 10/29/2021   Procedure: AMPUTATION BELOW KNEE -LEFT;  Surgeon: Tarry Kos, MD;  Location: [facility] OR;  Service: Orthopedics;  Laterality: Left;   APPLICATION OF WOUND VAC Left 07/07/2021   Procedure: APPLICATION OF WOUND VAC;  Surgeon: Tarry Kos, MD;  Location: [facility] OR;  Service: Orthopedics;  Laterality: Left;   APPLICATION OF WOUND VAC Left 08/29/2021   Procedure: APPLICATION OF WOUND VAC;  Surgeon: Tarry Kos, MD;  Location: [facility] OR;  Service: Orthopedics;  Laterality: Left;   I & D EXTREMITY Left 07/03/2021   Procedure: IRRIGATION AND DEBRIDEMENT ,FIFTH RAY  AMPUTATION LEFT FOOT, , WOUND VAC PLACEMENT;  Surgeon: Tarry Kos, MD;  Location: [facility] OR;  Service: Orthopedics;  Laterality: Left;   I & D EXTREMITY Left 07/07/2021   Procedure: IRRIGATION AND DEBRIDEMENT LEFT FOOT;  Surgeon: Tarry Kos, MD;  Location: [facility] OR;  Service: Orthopedics;  Laterality: Left;   I & D EXTREMITY Left 08/29/2021   Procedure: IRRIGATION AND  DEBRIDEMENT LEFT FOOT;  Surgeon: Tarry Kos, MD;  Location: [facility] OR;  Service: Orthopedics;  Laterality: Left;   IR FLUORO GUIDE CV LINE RIGHT  07/09/2021   IR REMOVAL TUN CV CATH W/O FL  10/08/2021   IR REMOVAL TUN CV CATH W/O FL  07/13/2023   IR REPLACE G-TUBE SIMPLE WO FLUORO  09/20/2023   IR US GUIDE VASC ACCESS RIGHT  07/09/2021   VITRECTOMY Left    [place] eye    Allergies: No Known Allergies  Medications:   Prior to Admission medications   Medication Sig Start Date End Date Taking? Authorizing Provider  amiodarone (PACERONE) 100 MG tablet Take 100 mg by mouth daily.   Yes [provider]  amLODipine (NORVASC) 10 MG tablet Take 10 mg by mouth daily.   Yes  [provider]  amoxicillin-clavulanate (AUGMENTIN) 875-125 MG tablet Take 1 tablet by mouth 2 (two) times daily. 10/25/23  Yes [provider]  ascorbic acid (VITAMIN C) 500 MG tablet Take 1 tablet (500 mg total) by mouth 2 (two) times daily. 10/02/23  Yes [name], MD  ASHWAGANDHA PO Take 2 tablets by mouth daily as needed.   Yes [provider]  atorvastatin (LIPITOR) 40 MG tablet Take 40 mg by mouth at bedtime.   Yes [provider]  carvedilol (COREG) 25 MG tablet Take 1 tablet (25 mg total) by mouth 2 (two) times daily with a meal. 10/02/23  Yes [name], MD  cloNIDine (CATAPRES) 0.1 MG tablet Take 1 tablet (0.1 mg total) by mouth daily. 10/02/23  Yes [name], MD  diphenhydramine-acetaminophen (TYLENOL PM) 25-500 MG TABS tablet Take 2 tablets by mouth at bedtime as needed.   Yes [provider]  finasteride (PROSCAR) 5 MG tablet Take 5 mg by mouth daily.   Yes [provider]  food thickener (SIMPLYTHICK, NECTAR/LEVEL 2/MILDLY THICK,) GEL Take 1 packet by mouth as needed. 10/02/23  Yes [name], MD  furosemide (LASIX) 40 MG tablet Take 40 mg by mouth daily.   Yes [provider]  glipiZIDE (GLUCOTROL) 5 MG  tablet Take 1 tablet (5 mg total) by mouth daily. 05/21/21 11/01/23 Yes Sheikh, Omair Latif, DO  hydrALAZINE (APRESOLINE) 25 MG tablet Take 25 mg by mouth every 8 (eight) hours.   Yes [provider]  LANTUS SOLOSTAR 100 UNIT/ML Solostar Pen Inject 5 Units into the skin daily. 10/02/23  Yes [name], MD  levETIRAcetam (KEPPRA) 750 MG tablet Take 1,500 mg by mouth 2 (two) times daily.   Yes [provider]  losartan (COZAAR) 50 MG tablet Take 1 tablet (50 mg total) by mouth daily. 10/02/23  Yes [name], MD  melatonin 5 MG TABS Take 2 tablets by mouth at bedtime.   Yes [provider]  methocarbamol (ROBAXIN) 500 MG tablet Take 1 tablet (500 mg total) by mouth every 8 (eight) hours as needed for muscle spasms. 10/02/23  Yes [name], MD  multivitamin (RENA-VIT) TABS tablet Take 1 tablet by mouth at bedtime. 10/02/23  Yes [name], MD  ondansetron (ZOFRAN-ODT) 4 MG disintegrating tablet Take 4 mg by mouth every 4 (four) hours as needed. 10/12/23  Yes [provider]  pantoprazole (PROTONIX) 40 MG tablet Take 1 tablet (40 mg total) by mouth daily. 05/22/21  Yes Sheikh, Omair Latif, DO  PARoxetine (PAXIL) 10 MG tablet Take 10 mg by mouth daily.   Yes [provider]  terazosin (HYTRIN) 1 MG capsule Take 1 mg by mouth at bedtime.   Yes [provider]  Vitamin D, Ergocalciferol, (DRISDOL) 1.25 MG (50000 UNIT) CAPS capsule Take 1 capsule (50,000 Units total) by mouth every 7 (seven) days. Patient taking differently: Take 50,000 Units by mouth every 7 (seven) days. On Thursdays 10/03/23  Yes [name], MD  albuterol (VENTOLIN HFA) 108 (90 Base) MCG/ACT inhaler Inhale 2 puffs into the lungs every 4 (four) hours as needed. Patient not taking: Reported on 11/01/2023 09/15/23   [provider]  feeding supplement (ENSURE ENLIVE / ENSURE PLUS) LIQD Take 237 mLs by mouth 2 (two) times daily  between meals. Patient not taking: Reported on 11/01/2023 10/02/23   [name], MD  nutrition supplement, JUVEN, [employer]) PACK Take 1 packet by mouth 2 (two)  times daily between meals. Patient not taking: Reported on 11/01/2023 10/02/23   [name], MD    Discontinued Meds:   Medications Discontinued During This Encounter  Medication Reason   promethazine (PHENERGAN) 12.5 mg in sodium chloride 0.9 % 50 mL IVPB    nystatin (MYCOSTATIN) 100000 UNIT/ML suspension    ondansetron (ZOFRAN) 4 MG tablet    meropenem (MERREM) 500 mg in sodium chloride 0.9 % 100 mL IVPB    levETIRAcetam (KEPPRA) IVPB 1500 mg/ 100 mL premix     Social History:  reports that he has never smoked. He has never used smokeless tobacco. He reports that he does not currently use alcohol. He reports that he does not use drugs.  Family History:   Family History  Problem Relation Age of Onset   Stroke Mother    Cancer Mother    Heart disease Father     Blood pressure 132/63, pulse 82, temperature 98 F (36.7 C), resp. rate 18, height 6\' 3"  (1.905 m), weight 104.3 kg, SpO2 100%. Physical Exam: General: Chronically ill appearing male, in no acute distress. Head: NCAT Neck: Supple. JVD not elevated. Lungs: CTA b/l Heart: RRR with S1 S2.  Abdomen: Soft, NABS. Peg tube in place. Foley catheter patent to gravity Lower extremities: L BKA, No RLE edema Skin: Sacral decub with wound vac in place Neuro: Alert and oriented X 3. Moves all extremities spontaneously. Psych:  Responds to questions appropriately with a normal affect. Dialysis Access: [facility]     Elkin Belfield, Len Blalock, MD 11/02/2023, 8:06 AM

## 2023-11-02 NOTE — Progress Notes (Signed)
[facility] PROGRESS NOTE   Bruce Little [place] YQM:[id number] DOB: 08-27-1971 DOA: 11/01/2023  PCP: Bobbye Morton, MD  Brief History: 52 year old male with past medical history of intracranial bleed status post craniectomy in [place] in June 2024.  Chronic kidney disease now end-stage renal disease.  Sacral decubitus, PEG tube, chronic Foley with recurrent UTIs, history of diabetes, essential hypertension, dyslipidemia, atrial fibrillation who presented with intractable nausea and vomiting.  He has chronic nausea vomiting but this was thought to be worse than his usual.  Noted to be incidentally positive for COVID-19.  He was hospitalized for further management.  Consultants: Nephrology  Procedures: Dialysis    Subjective/Interval History: Patient denies any pain.  Nausea is better.  Denies any abdominal pain specifically.    Assessment/Plan:  COVID-19 infection No fever noted.  Not hypoxic.  Chest x-ray did not show any opacities. Appears to be asymptomatic for the most part.  No indication for any antivirals at this time.  No indication for steroids.  Recurrent nausea and vomiting He has chronic nausea and vomiting at baseline but apparently worse over the past few days.  CT of the abdomen pelvis did not show any acute findings except for a large amount of stool in the rectum which could be contributing. Abdomen is benign on examination.  Continue to monitor. Currently on full liquid diet.  Can be advanced if he does not have any recurrent vomiting.  Significant constipation with possible fecal impaction CT scan showed significant stool in the rectum.  According to H&P patient had a large bowel movement in the ED.  Will give him stool softeners and laxatives.  Enema in case there is some stool still left in the rectum.  Check TSH.  History of intracranial bleed status post craniectomy Stable.  Continue Keppra.  Currently getting it IV.  Changed to oral tomorrow if he  does not have any further nausea or vomiting.  End-stage renal disease On hemodialysis.  Nephrology is following.  Abnormal UA in the setting of chronic Foley catheter Likely colonized.  Does not appear to have active infection.  Hold off on antibiotics.  Essential hypertension Monitor blood pressures closely.  Noted to be on carvedilol clonidine hydralazine.  History of atrial fibrillation No longer on Eliquis.  On amiodarone.  Diabetes mellitus type 2 on insulin Continue glargine.  SSI.  Monitor CBGs.  CBGs noted to be somewhat on the lower side.  We will decrease the dose of his glargine.  History of dysphagia Has a PEG tube.  However he is able to tolerate a dysphagia 3 diet at home with nectar thick liquids.  Uses PEG tube primarily for supplemental fluids.  Currently on full liquids.  Continue to monitor.  Sacral decubitus stage IV Seems to be better based on CT report.     DVT Prophylaxis: Initiate subcutaneous heparin Code Status: Full code Family Communication: No family at bedside Disposition Plan: Hopefully return home when improved  Status is: Inpatient Remains inpatient appropriate because: Intractable nausea and vomiting      Medications: Scheduled:  amiodarone  100 mg Oral Daily   ascorbic acid  500 mg Oral BID   carvedilol  25 mg Oral BID WC   Chlorhexidine Gluconate Cloth  6 each Topical Q0600   cloNIDine  0.1 mg Oral Daily   doxercalciferol  1 mcg Intravenous Q T,Th,Sa-HD   feeding supplement (NEPRO CARB STEADY)  237 mL Oral TID BM   finasteride  5 mg Oral Daily  free water  200 mL Per Tube Q6H   hydrALAZINE  25 mg Oral Q8H   insulin aspart  0-5 Units Subcutaneous QHS   insulin aspart  0-6 Units Subcutaneous TID WC   insulin glargine-yfgn  5 Units Subcutaneous Daily   leptospermum manuka honey  1 Application Topical Daily   levETIRAcetam  1,500 mg Oral BID   multivitamin  1 tablet Oral QHS   nutrition supplement (JUVEN)  1 packet Per Tube BID BM    ondansetron (ZOFRAN) IV  4 mg Intravenous Q6H   pantoprazole (PROTONIX) IV  40 mg Intravenous Q12H   PARoxetine  10 mg Oral Daily   polyethylene glycol  17 g Oral BID   senna-docusate  2 tablet Oral BID   sodium chloride flush  3 mL Intravenous Q12H   sodium phosphate  1 enema Rectal Once   zinc sulfate (50mg  elemental zinc)  220 mg Oral Daily   Continuous:  sodium chloride 333 mL/hr at 11/02/23 0827   promethazine (PHENERGAN) injection (IM or IVPB) 12.5 mg (11/01/23 2023)   BMW:[id number] **OR** acetaminophen, heparin, hydrALAZINE, methocarbamol (ROBAXIN) injection, promethazine (PHENERGAN) injection (IM or IVPB)  Antibiotics: Anti-infectives (From admission, onward)    Start     Dose/Rate Route Frequency Ordered Stop   11/02/23 1400  meropenem (MERREM) 500 mg in sodium chloride 0.9 % 100 mL IVPB  Status:  Discontinued        500 mg 200 mL/hr over 30 Minutes Intravenous Every 24 hours 11/01/23 1206 11/01/23 1646   11/01/23 1215  meropenem (MERREM) 1 g in sodium chloride 0.9 % 100 mL IVPB        1 g 200 mL/hr over 30 Minutes Intravenous  Once 11/01/23 1206 11/01/23 1356       Objective:  Vital Signs  Vitals:   11/02/23 0752 11/02/23 0800 11/02/23 0807 11/02/23 0837  BP: 132/63 (!) 147/85 (!) 147/85 130/67  Pulse: 82 81 79 81  Resp: 18 12 13 14   Temp: 98 F (36.7 C) 97.6 F (36.4 C)    TempSrc:  Axillary    SpO2: 100% 100% 99% 99%  Weight:  104.2 kg    Height:        Intake/Output Summary (Last 24 hours) at 11/02/2023 0856 Last data filed at 11/02/2023 0500 Gross per 24 hour  Intake 402.27 ml  Output 50 ml  Net 352.27 ml   Filed Weights   11/01/23 0543 11/02/23 0800  Weight: 104.3 kg 104.2 kg    General appearance: Awake alert.  In no distress.  Distracted Resp: Clear to auscultation bilaterally.  Normal effort Cardio: S1-S2 is normal regular.  No S3-S4.  No rubs murmurs or bruit GI: Abdomen is soft.  Nontender nondistended.  PEG tube is noted.   Foley catheter is noted Extremities: Mild edema.     Lab Results:  Data Reviewed: I have personally reviewed following labs and reports of the imaging studies  CBC: Recent Labs  Lab 11/01/23 0620  WBC 16.2*  NEUTROABS 14.3*  HGB 10.0*  HCT 30.6*  MCV 97.8  PLT 408*    Basic Metabolic Panel: Recent Labs  Lab 11/01/23 0620 11/02/23 0454  NA 134* 133*  K 3.4* 3.2*  CL 90* 94*  CO2 27 28  GLUCOSE 122* 111*  BUN 18 20  CREATININE 3.29* 3.53*  CALCIUM 8.6* 8.5*  MG  --  1.9  PHOS  --  4.0    GFR: Estimated Creatinine Clearance: 32.4 mL/min (A) (by C-G formula  based on SCr of 3.53 mg/dL (H)).  Liver Function Tests: Recent Labs  Lab 11/01/23 0620 11/02/23 0454  AST 27  --   ALT 28  --   ALKPHOS 120  --   BILITOT 0.7  --   PROT 6.7  --   ALBUMIN 2.0* 1.9*    Recent Labs  Lab 11/01/23 0620  LIPASE 49    CBG: Recent Labs  Lab 11/01/23 1325 11/01/23 1619 11/01/23 2043 11/02/23 0809  GLUCAP 94 105* 82 92      Recent Results (from the past 240 hours)  Resp panel by RT-PCR (RSV, Flu A&B, Covid) Urine, Catheterized     Status: Abnormal   Collection Time: 11/01/23  8:06 AM   Specimen: Urine, Catheterized; Nasal Swab  Result Value Ref Range Status   SARS Coronavirus 2 by RT PCR POSITIVE (A) NEGATIVE Final   Influenza A by PCR NEGATIVE NEGATIVE Final   Influenza B by PCR NEGATIVE NEGATIVE Final    Comment: (NOTE) The Xpert Xpress SARS-CoV-2/FLU/RSV plus assay is intended as an aid in the diagnosis of influenza from Nasopharyngeal swab specimens and should not be used as a sole basis for treatment. Nasal washings and aspirates are unacceptable for Xpert Xpress SARS-CoV-2/FLU/RSV testing.  Fact Sheet for Patients: BloggerCourse.com  Fact Sheet for Healthcare Providers: SeriousBroker.it  This test is not yet approved or cleared by the [place] FDA and has been authorized for detection and/or  diagnosis of SARS-CoV-2 by FDA under an Emergency Use Authorization (EUA). This EUA will remain in effect (meaning this test can be used) for the duration of the COVID-19 declaration under Section 564(b)(1) of the Act, 21 U.S.C. section 360bbb-3(b)(1), unless the authorization is terminated or revoked.     Resp Syncytial Virus by PCR NEGATIVE NEGATIVE Final    Comment: (NOTE) Fact Sheet for Patients: BloggerCourse.com  Fact Sheet for Healthcare Providers: SeriousBroker.it  This test is not yet approved or cleared by the [place] FDA and has been authorized for detection and/or diagnosis of SARS-CoV-2 by FDA under an Emergency Use Authorization (EUA). This EUA will remain in effect (meaning this test can be used) for the duration of the COVID-19 declaration under Section 564(b)(1) of the Act, 21 U.S.C. section 360bbb-3(b)(1), unless the authorization is terminated or revoked.  Performed at [facility], [address], [place], [place] [address]       Radiology Studies: CT ABDOMEN PELVIS WO CONTRAST Result Date: 11/01/2023 CLINICAL DATA:  Nausea, vomiting. EXAM: CT ABDOMEN AND PELVIS WITHOUT CONTRAST TECHNIQUE: Multidetector CT imaging of the abdomen and pelvis was performed following the standard protocol without IV contrast. RADIATION DOSE REDUCTION: This exam was performed according to the departmental dose-optimization program which includes automated exposure control, adjustment of the mA and/or kV according to patient size and/or use of iterative reconstruction technique. COMPARISON:  September 17, 2023. FINDINGS: Lower chest: Minimal left lower lobe airspace opacity is noted suggesting possible inflammation or atelectasis. Hepatobiliary: No focal liver abnormality is seen. No gallstones, gallbladder wall thickening, or biliary dilatation. Pancreas: Unremarkable. No pancreatic ductal dilatation or surrounding inflammatory  changes. Spleen: Normal in size without focal abnormality. Adrenals/Urinary Tract: Adrenal glands and kidneys appear normal. Urinary bladder is decompressed secondary to Foley catheter. Stomach/Bowel: Gastrostomy tube is in good position. There is no evidence of bowel obstruction or inflammation. The appendix appears normal. Large amount of stool seen in the rectum. Vascular/Lymphatic: Aortic atherosclerosis. No enlarged abdominal or pelvic lymph nodes. Reproductive: Prostate is unremarkable. Other:  No ascites or hernia is noted. There is again noted large sacral decubitus ulcer although it is improved compared to prior exam. There is stable destruction of portions of the sacrum and coccyx related to chronic osteomyelitis. Musculoskeletal: Chronic findings as noted above. No acute osseous abnormality. IMPRESSION: Sacral decubitus ulcer is significantly smaller compared to prior exam, with stable destructive changes involving sacrum and coccyx consistent with chronic osteomyelitis. Gastrostomy tube is in grossly good position. Large amount of stool seen in rectum concerning for impaction. Minimal left lower lobe airspace opacity is noted suggesting possible inflammation or atelectasis. Aortic Atherosclerosis (ICD10-I70.0). Electronically Signed   By: Lupita Raider M.D.   On: 11/01/2023 08:42   CT Head Wo Contrast Result Date: 11/01/2023 CLINICAL DATA:  Head trauma, skull fracture or hematoma. EXAM: CT HEAD WITHOUT CONTRAST TECHNIQUE: Contiguous axial images were obtained from the base of the skull through the vertex without intravenous contrast. RADIATION DOSE REDUCTION: This exam was performed according to the departmental dose-optimization program which includes automated exposure control, adjustment of the mA and/or kV according to patient size and/or use of iterative reconstruction technique. 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 The frontal parietal region is bulging outward at the craniectomy defect and anteriorly there is somewhat greater sunken curvature. No midline shift or hydrocephalus. No evidence of acute infarct, mass, or acute hemorrhage. Vascular: No acute finding. Skull: Normal. Negative for fracture or focal lesion. Sinuses/Orbits: No acute finding. IMPRESSION: No acute finding.  No hemorrhage or new infarct. Electronically Signed   By: Tiburcio Pea M.D.   On: 11/01/2023 08:33   DG ABD ACUTE 2+V W 1V CHEST Result Date: 11/01/2023 CLINICAL DATA:  Vomiting. EXAM: DG ABDOMEN ACUTE WITH 1 VIEW CHEST COMPARISON:  View chest x-ray 11/01/2023 FINDINGS: A right IJ dialysis catheter is stable. The heart size is normal. Lung volumes are low. No focal airspace disease is present. Bowel gas pattern is normal. No obstruction or free air is present. Mild degenerative changes are present in the lower lumbar spine. IMPRESSION: 1. No acute cardiopulmonary disease. 2. Normal bowel gas pattern. Electronically Signed   By: Marin Roberts M.D.   On: 11/01/2023 06:55       LOS: 1 day   Osvaldo Shipper  [facility] Pager on [email or web address]  11/02/2023, 8:56 AM

## 2023-11-02 NOTE — Progress Notes (Signed)
   11/02/23 1128  Vitals  Pulse Rate 86  Resp 16  BP (!) 149/82  SpO2 100 %  Oxygen Therapy  Patient Activity (if Appropriate) In bed  Pulse Oximetry Type Continuous  Post Treatment  Dialyzer Clearance Lightly streaked  Liters Processed 63  Fluid Removed (mL) 300 mL  Tolerated HD Treatment Yes   Received patient in bed to unit.  Alert and oriented.  Informed consent signed and in chart.   TX duration: 3.0 HOURS CVC dressing changed today.  Patient tolerated well.  Transported back to the room  Alert, without acute distress.  Hand-off given to patient's nurse.   Access used: CVC Access issues: NO  Total UF removed: O Medication(s) given: SEE MARS    Laqueta Due, RN [facility]

## 2023-11-02 NOTE — Plan of Care (Signed)
  Problem: Clinical Measurements: Goal: Will remain free from infection Outcome: Progressing   Problem: Nutrition: Goal: Adequate nutrition will be maintained Outcome: Progressing   Problem: Pain Management: Goal: General experience of comfort will improve Outcome: Progressing   Problem: Skin Integrity: Goal: Risk for impaired skin integrity will decrease Outcome: Progressing   Problem: Coping: Goal: Ability to adjust to condition or change in health will improve Outcome: Progressing   Problem: Nutritional: Goal: Maintenance of adequate nutrition will improve Outcome: Progressing   Problem: Tissue Perfusion: Goal: Adequacy of tissue perfusion will improve Outcome: Progressing

## 2023-11-03 DIAGNOSIS — U071 COVID-19: Secondary | ICD-10-CM | POA: Diagnosis not present

## 2023-11-03 LAB — CBC
HCT: 27.7 % — ABNORMAL LOW (ref 39.0–52.0)
Hemoglobin: 8.9 g/dL — ABNORMAL LOW (ref 13.0–17.0)
MCH: 31.3 pg (ref 26.0–34.0)
MCHC: 32.1 g/dL (ref 30.0–36.0)
MCV: 97.5 fL (ref 80.0–100.0)
Platelets: 373 10*3/uL (ref 150–400)
RBC: 2.84 MIL/uL — ABNORMAL LOW (ref 4.22–5.81)
RDW: 15.4 % (ref 11.5–15.5)
WBC: 14.2 10*3/uL — ABNORMAL HIGH (ref 4.0–10.5)
nRBC: 0 % (ref 0.0–0.2)

## 2023-11-03 LAB — URINE CULTURE: Culture: >=100000 — AB

## 2023-11-03 LAB — BASIC METABOLIC PANEL
Anion gap: 11 (ref 5–15)
BUN: 22 mg/dL — ABNORMAL HIGH (ref 6–20)
CO2: 25 mmol/L (ref 22–32)
Calcium: 8.5 mg/dL — ABNORMAL LOW (ref 8.9–10.3)
Chloride: 98 mmol/L (ref 98–111)
Creatinine, Ser: 2.41 mg/dL — ABNORMAL HIGH (ref 0.61–1.24)
GFR, Estimated: 32 mL/min — ABNORMAL LOW (ref >60)
Glucose, Bld: 82 mg/dL (ref 70–99)
Potassium: 3.4 mmol/L — ABNORMAL LOW (ref 3.5–5.1)
Sodium: 134 mmol/L — ABNORMAL LOW (ref 135–145)

## 2023-11-03 LAB — TSH: TSH: 3.323 u[IU]/mL (ref 0.350–4.500)

## 2023-11-03 LAB — ZINC: Zinc: 48 ug/dL (ref 44–115)

## 2023-11-03 LAB — GLUCOSE, CAPILLARY: Glucose-Capillary: 103 mg/dL — ABNORMAL HIGH (ref 70–99)

## 2023-11-03 LAB — COPPER, SERUM: Copper: 68 ug/dL — ABNORMAL LOW (ref 69–132)

## 2023-11-03 MED ORDER — POLYETHYLENE GLYCOL 3350 17 G PO PACK: 17.0000 g | PACK | Freq: Two times a day (BID) | ORAL | 0 refills | Status: DC

## 2023-11-03 MED ORDER — PROMETHAZINE HCL 12.5 MG PO TABS: 12.5000 mg | ORAL_TABLET | Freq: Four times a day (QID) | ORAL | 0 refills | Status: DC | PRN

## 2023-11-03 MED ORDER — SENNOSIDES-DOCUSATE SODIUM 8.6-50 MG PO TABS: 2.0000 | ORAL_TABLET | Freq: Two times a day (BID) | ORAL | 0 refills | Status: DC

## 2023-11-03 NOTE — Progress Notes (Signed)
Bruce Little is an 52 y.o. male with ESRD on hemodialysis T,Th,S at [facility]. 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 He presented to ED this AM with C/O nausea and vomiting. He has been admitted for COVID. CXR unremarkable.   Dialysis Orders: Center: [facility] T,Th,S 4 hours 160NRe 82.2 kg 3.0 K/2.5 Ca TDC - No Heparin  - Mircera 100 mcg IV q 2 weeks - Hectorol 1 mcg IV TIW   Assessment/Plan:  Covid- Management per primary  N & V-TF on hold at present. Per primary  Hypokalemia-mild. Give 20 MEQ KCL per peg today.   Leukocytosis- W/U per primary. Started empiric ABX for possible UTI.   ESRD -  T,Th,S HD today; he is about to be hooked up on dialysis this morning bedside and then resume regular schedule.  Tolerated dialysis on Tuesday with net UF 300 mL. - Next dialysis Thursday if he is still here by his and be done as outpatient as he appears to be comfortable.  Hypertension/volume  - BP controlled.   Anemia  - HGB 10.0 Follow trends  Metabolic bone disease -  OP calcium and PO4 at goal 10/28/2023. Continue VDRa.   Nutrition - Peg feedings on hold.   DMT2 Management per primary  Subjective: Alert today and appears to be comfortable not on any oxygen breathing comfortably; denies fever chills nausea vomiting or shortness of breath.   Chemistry and CBC: Creat  Date/Time Value Ref Range Status  10/13/2021 11:39 AM 2.73 (H) 0.60 - 1.29 mg/dL Final   Creatinine, Ser  Date/Time Value Ref Range Status  11/02/2023 04:54 AM 3.53 (H) 0.61 - 1.24 mg/dL Final  16/08/9603 54:09 AM 3.29 (H) 0.61 - 1.24 mg/dL Final  81/19/1478 29:56 AM 2.22 (H) 0.61 - 1.24 mg/dL Final  21/30/8657 84:69 PM 1.59 (H) 0.61 - 1.24 mg/dL Final    Comment:    DELTA CHECK NOTED  09/30/2023 05:53 AM 2.81 (H) 0.61 - 1.24 mg/dL  Final  62/95/2841 32:44 AM 2.80 (H) 0.61 - 1.24 mg/dL Final  11/11/7251 66:44 AM 2.42 (H) 0.61 - 1.24 mg/dL Final  03/47/4259 56:38 AM 2.45 (H) 0.61 - 1.24 mg/dL Final  75/64/3329 51:88 AM 1.95 (H) 0.61 - 1.24 mg/dL Final  41/66/0630 16:01 AM 2.38 (H) 0.61 - 1.24 mg/dL Final  09/32/3557 32:20 PM 1.84 (H) 0.61 - 1.24 mg/dL Final  25/42/7062 37:62 PM 2.54 (H) 0.61 - 1.24 mg/dL Final  83/15/1761 60:73 AM 2.63 (H) 0.61 - 1.24 mg/dL Final    Comment:    DELTA CHECK NOTED  09/19/2023 04:52 AM 1.15 0.61 - 1.24 mg/dL Final  71/04/2693 85:46 AM 1.85 (H) 0.61 - 1.24 mg/dL Final  27/01/5008 38:18 AM 1.69 (H) 0.61 - 1.24 mg/dL Final  29/93/7169 67:89 AM 2.97 (H) 0.61 - 1.24 mg/dL Final  38/08/1750 02:58 AM 2.50 (H) 0.61 - 1.24 mg/dL Final  52/77/8242 35:36 AM 2.69 (H) 0.61 - 1.24 mg/dL Final  14/43/1540 08:67 AM 2.99 (H) 0.61 - 1.24 mg/dL Final  61/95/0932 67:12 AM 1.86 (H) 0.61 - 1.24 mg/dL Final  45/80/9983 38:25 AM 1.41 (H) 0.61 - 1.24 mg/dL Final  05/39/7673 41:93 AM 2.62 (H) 0.61 - 1.24 mg/dL Final  79/12/4095 35:32 AM 3.00 (H) 0.61 - 1.24 mg/dL Final  99/24/2683 41:96 AM 2.60 (H) 0.61 - 1.24 mg/dL Final  22/29/7989  07:37 AM 2.74 (H) 0.61 - 1.24 mg/dL Final  16/08/9603 54:09 AM 2.86 (H) 0.61 - 1.24 mg/dL Final  81/19/1478 29:56 AM 3.39 (H) 0.61 - 1.24 mg/dL Final  21/30/8657 84:69 AM 3.06 (H) 0.61 - 1.24 mg/dL Final  62/95/2841 32:44 AM 3.37 (H) 0.61 - 1.24 mg/dL Final  11/11/7251 66:44 AM 3.49 (H) 0.61 - 1.24 mg/dL Final  03/47/4259 56:38 AM 2.77 (H) 0.61 - 1.24 mg/dL Final  75/64/3329 51:88 AM 2.59 (H) 0.61 - 1.24 mg/dL Final  41/66/0630 16:01 AM 3.56 (H) 0.61 - 1.24 mg/dL Final  09/32/3557 32:20 PM 2.44 (H) 0.61 - 1.24 mg/dL Final  25/42/7062 37:62 AM 3.40 (H) 0.61 - 1.24 mg/dL Final  83/15/1761 60:73 PM 3.02 (H) 0.61 - 1.24 mg/dL Final  71/04/2693 85:46 AM 2.79 (H) 0.61 - 1.24 mg/dL Final  27/01/5008 38:18 PM 3.02 (H) 0.61 - 1.24 mg/dL Final  29/93/7169 67:89 PM 3.40 (H) 0.61 - 1.24  mg/dL Final  38/08/1750 02:58 AM 3.80 (H) 0.61 - 1.24 mg/dL Final  52/77/8242 35:36 AM 4.44 (H) 0.61 - 1.24 mg/dL Final  14/43/1540 08:67 AM 4.77 (H) 0.61 - 1.24 mg/dL Final  61/95/0932 67:12 AM 5.10 (H) 0.61 - 1.24 mg/dL Final  45/80/9983 38:25 AM 5.33 (H) 0.61 - 1.24 mg/dL Final  05/39/7673 41:93 AM 5.42 (H) 0.61 - 1.24 mg/dL Final  79/12/4095 35:32 AM 5.48 (H) 0.61 - 1.24 mg/dL Final  99/24/2683 41:96 AM 5.47 (H) 0.61 - 1.24 mg/dL Final  22/29/7989 21:19 AM 5.70 (H) 0.61 - 1.24 mg/dL Final  41/74/0814 48:18 PM 6.20 (H) 0.61 - 1.24 mg/dL Final  56/31/4970 26:37 AM 6.11 (H) 0.61 - 1.24 mg/dL Final  85/88/5027 74:12 AM 6.63 (H) 0.61 - 1.24 mg/dL Final   Recent Labs  Lab 11/01/23 0620 11/02/23 0454  NA 134* 133*  K 3.4* 3.2*  CL 90* 94*  CO2 27 28  GLUCOSE 122* 111*  BUN 18 20  CREATININE 3.29* 3.53*  CALCIUM 8.6* 8.5*  PHOS  --  4.0   Recent Labs  Lab 11/01/23 0620 11/03/23 0422  WBC 16.2* 14.2*  NEUTROABS 14.3*  --   HGB 10.0* 8.9*  HCT 30.6* 27.7*  MCV 97.8 97.5  PLT 408* 373   Liver Function Tests: Recent Labs  Lab 11/01/23 0620 11/02/23 0454  AST 27  --   ALT 28  --   ALKPHOS 120  --   BILITOT 0.7  --   PROT 6.7  --   ALBUMIN 2.0* 1.9*   Recent Labs  Lab 11/01/23 0620  LIPASE 49   No results for input(s): "AMMONIA" in the last 168 hours. Cardiac Enzymes: No results for input(s): "CKTOTAL", "CKMB", "CKMBINDEX", "TROPONINI" in the last 168 hours. Iron Studies: No results for input(s): "IRON", "TIBC", "TRANSFERRIN", "FERRITIN" in the last 72 hours. PT/INR: @LABRCNTIP (inr:5)  Xrays/Other Studies: ) Results for orders placed or performed during the hospital encounter of 11/01/23 (from the past 48 hours)  Urinalysis, Routine w reflex microscopic -Urine, Clean Catch     Status: Abnormal   Collection Time: 11/01/23  8:06 AM  Result Value Ref Range   Color, Urine AMBER (A) YELLOW    Comment: BIOCHEMICALS MAY BE AFFECTED BY COLOR   APPearance CLOUDY  (A) CLEAR   Specific Gravity, Urine 1.016 1.005 - 1.030   pH 6.0 5.0 - 8.0   Glucose, UA NEGATIVE NEGATIVE mg/dL   Hgb urine dipstick NEGATIVE NEGATIVE   Bilirubin Urine NEGATIVE NEGATIVE   Ketones, ur NEGATIVE NEGATIVE mg/dL  Protein, ur >=300 (A) NEGATIVE mg/dL   Nitrite NEGATIVE NEGATIVE   Leukocytes,Ua LARGE (A) NEGATIVE   RBC / HPF 6-10 0 - 5 RBC/hpf   WBC, UA >50 0 - 5 WBC/hpf   Bacteria, UA MANY (A) NONE SEEN   Squamous Epithelial / HPF 0-5 0 - 5 /HPF   WBC Clumps PRESENT    Mucus PRESENT    Budding Yeast PRESENT     Comment: Performed at [facility], [address], [place], [place] [address]  Resp panel by RT-PCR (RSV, Flu A&B, Covid) Urine, Catheterized     Status: Abnormal   Collection Time: 11/01/23  8:06 AM   Specimen: Urine, Catheterized; Nasal Swab  Result Value Ref Range   SARS Coronavirus 2 by RT PCR POSITIVE (A) NEGATIVE   Influenza A by PCR NEGATIVE NEGATIVE   Influenza B by PCR NEGATIVE NEGATIVE    Comment: (NOTE) The Xpert Xpress SARS-CoV-2/FLU/RSV plus assay is intended as an aid in the diagnosis of influenza from Nasopharyngeal swab specimens and should not be used as a sole basis for treatment. Nasal washings and aspirates are unacceptable for Xpert Xpress SARS-CoV-2/FLU/RSV testing.  Fact Sheet for Patients: BloggerCourse.com  Fact Sheet for Healthcare Providers: SeriousBroker.it  This test is not yet approved or cleared by the [place] FDA and has been authorized for detection and/or diagnosis of SARS-CoV-2 by FDA under an Emergency Use Authorization (EUA). This EUA will remain in effect (meaning this test can be used) for the duration of the COVID-19 declaration under Section 564(b)(1) of the Act, 21 U.S.C. section 360bbb-3(b)(1), unless the authorization is terminated or revoked.     Resp Syncytial Virus by PCR NEGATIVE NEGATIVE    Comment: (NOTE) Fact Sheet for  Patients: BloggerCourse.com  Fact Sheet for Healthcare Providers: SeriousBroker.it  This test is not yet approved or cleared by the [place] FDA and has been authorized for detection and/or diagnosis of SARS-CoV-2 by FDA under an Emergency Use Authorization (EUA). This EUA will remain in effect (meaning this test can be used) for the duration of the COVID-19 declaration under Section 564(b)(1) of the Act, 21 U.S.C. section 360bbb-3(b)(1), unless the authorization is terminated or revoked.  Performed at [facility], [address], [place], [place] [address]   Urine Culture     Status: Abnormal (Preliminary result)   Collection Time: 11/01/23  8:06 AM   Specimen: Urine, Clean Catch  Result Value Ref Range   Specimen Description URINE, CLEAN CATCH    Special Requests NONE    Culture (A)     >=100,000 COLONIES/mL PSEUDOMONAS AERUGINOSA SUSCEPTIBILITIES TO FOLLOW Performed at [facility], [address], [place], [place] [address]    Report Status PENDING   Glucose, capillary     Status: None   Collection Time: 11/01/23  1:25 PM  Result Value Ref Range   Glucose-Capillary 94 70 - 99 mg/dL    Comment: Glucose reference range applies only to samples taken after fasting for at least 8 hours.  C-reactive protein     Status: Abnormal   Collection Time: 11/01/23  1:57 PM  Result Value Ref Range   CRP 15.3 (H) <1.0 mg/dL    Comment: Performed at [facility], [address] [place], [place] [address]  D-dimer, quantitative     Status: Abnormal   Collection Time: 11/01/23  1:57 PM  Result Value Ref Range   D-Dimer, Quant 1.47 (H) 0.00 - 0.50 ug/mL-FEU  Comment: (NOTE) At the manufacturer cut-off value of 0.5 g/mL FEU, this assay has a negative predictive value of 95-100%.This assay is intended for use in conjunction with a clinical pretest probability (PTP) assessment model to exclude pulmonary embolism  (PE) and deep venous thrombosis (DVT) in outpatients suspected of PE or DVT. Results should be correlated with clinical presentation. Performed at [facility], [address], [place], [place] [address]   Hepatitis B surface antigen     Status: None   Collection Time: 11/01/23  1:57 PM  Result Value Ref Range   Hepatitis B Surface Ag NON REACTIVE NON REACTIVE    Comment: Performed at [facility], [address] [place], [place] [address]  Hepatitis B surface antibody,quantitative     Status: Abnormal   Collection Time: 11/01/23  1:57 PM  Result Value Ref Range   Hep B S AB Quant (Post) <3.5 (L) Immunity>10 mIU/mL    Comment: (NOTE)  Status of Immunity                     Anti-HBs Level  ------------------                     -------------- Inconsistent with Immunity                  0.0 - 10.0 Consistent with Immunity                         >10.0 Performed At: [facility] [address] [place], [place] [id number] Jolene Schimke MD [id number]   Glucose, capillary     Status: Abnormal   Collection Time: 11/01/23  4:19 PM  Result Value Ref Range   Glucose-Capillary 105 (H) 70 - 99 mg/dL    Comment: Glucose reference range applies only to samples taken after fasting for at least 8 hours.  Glucose, capillary     Status: None   Collection Time: 11/01/23  8:43 PM  Result Value Ref Range   Glucose-Capillary 82 70 - 99 mg/dL    Comment: Glucose reference range applies only to samples taken after fasting for at least 8 hours.  Renal function panel     Status: Abnormal   Collection Time: 11/02/23  4:54 AM  Result Value Ref Range   Sodium 133 (L) 135 - 145 mmol/L   Potassium 3.2 (L) 3.5 - 5.1 mmol/L   Chloride 94 (L) 98 - 111 mmol/L   CO2 28 22 - 32 mmol/L   Glucose, Bld 111 (H) 70 - 99 mg/dL    Comment: Glucose reference range applies only to samples taken after fasting for at least 8 hours.   BUN 20 6 - 20 mg/dL   Creatinine, Ser 9.62 (H) 0.61 - 1.24  mg/dL   Calcium 8.5 (L) 8.9 - 10.3 mg/dL   Phosphorus 4.0 2.5 - 4.6 mg/dL   Albumin 1.9 (L) 3.5 - 5.0 g/dL   GFR, Estimated 20 (L) >60 mL/min    Comment: (NOTE) Calculated using the CKD-EPI Creatinine Equation (2021)    Anion gap 11 5 - 15    Comment: Performed at [facility], [address] [place], [place] [address]  Magnesium     Status: None   Collection Time: 11/02/23  4:54 AM  Result Value Ref Range   Magnesium 1.9 1.7 - 2.4 mg/dL    Comment: Performed at [facility], [address], [place], [place] [address]  VITAMIN D 25 Hydroxy (Vit-D Deficiency, Fractures)     Status: None   Collection Time: 11/02/23  4:54 AM  Result Value Ref Range   Vit D, 25-Hydroxy 83.10 30 - 100 ng/mL    Comment: (NOTE) Vitamin D deficiency has been defined by the Institute of Medicine  and an Endocrine Society practice guideline as a level of serum 25-OH  vitamin D less than 20 ng/mL (1,2). The Endocrine Society went on to  further define vitamin D insufficiency as a level between 21 and 29  ng/mL (2).  1. IOM (Institute of Medicine). 2010. Dietary reference intakes for  calcium and D. [place]: [employer]. 2. Holick MF, Binkley [place], Bischoff-Ferrari HA, et al. Evaluation,  treatment, and prevention of vitamin D deficiency: an Endocrine  Society clinical practice guideline, JCEM. 2011 Jul; 96(7): 1911-30.  Performed at [facility], [address], [place], [place] [address]   Zinc     Status: None   Collection Time: 11/02/23  4:54 AM  Result Value Ref Range   Zinc 48 44 - 115 ug/dL    Comment: (NOTE) This test was developed and its performance characteristics determined by [facility]. It has not been cleared or approved by the Food and Drug Administration.                                Detection Limit = 5 Performed At: [facility] [address] [place], [place] [id number] Jolene Schimke MD QM:[id number]   Copper, serum     Status:  Abnormal   Collection Time: 11/02/23  4:54 AM  Result Value Ref Range   Copper 68 (L) 69 - 132 ug/dL    Comment: (NOTE) This test was developed and its performance characteristics determined by [facility]. It has not been cleared or approved by the Food and Drug Administration.                                Detection Limit = 5 Performed At: [facility] [address] [place], [place] [id number] Jolene Schimke MD NU:[id number]   Glucose, capillary     Status: None   Collection Time: 11/02/23  8:09 AM  Result Value Ref Range   Glucose-Capillary 92 70 - 99 mg/dL    Comment: Glucose reference range applies only to samples taken after fasting for at least 8 hours.  Glucose, capillary     Status: None   Collection Time: 11/02/23 12:01 PM  Result Value Ref Range   Glucose-Capillary 96 70 - 99 mg/dL    Comment: Glucose reference range applies only to samples taken after fasting for at least 8 hours.  Glucose, capillary     Status: Abnormal   Collection Time: 11/02/23  4:57 PM  Result Value Ref Range   Glucose-Capillary 112 (H) 70 - 99 mg/dL    Comment: Glucose reference range applies only to samples taken after fasting for at least 8 hours.  Glucose, capillary     Status: None   Collection Time: 11/02/23  9:17 PM  Result Value Ref Range   Glucose-Capillary 90 70 - 99 mg/dL    Comment: Glucose reference range applies only to samples taken after fasting for at least 8 hours.  CBC     Status: Abnormal   Collection Time: 11/03/23  4:22 AM  Result Value Ref Range   WBC 14.2 (H) 4.0 - 10.5 K/uL   RBC 2.84 (L) 4.22 - 5.81 MIL/uL   Hemoglobin 8.9 (L) 13.0 - 17.0 g/dL   HCT 10.2 (L) 72.5 - 36.6 %   MCV 97.5 80.0 - 100.0 fL   MCH 31.3 26.0 - 34.0 pg   MCHC 32.1 30.0 - 36.0 g/dL   RDW 44.0 34.7 - 42.5 %   Platelets 373 150 - 400 K/uL   nRBC 0.0 0.0 - 0.2 %    Comment: Performed at [facility], [address], [place], [place] [address]   CT ABDOMEN PELVIS WO  CONTRAST Result Date: 11/01/2023 CLINICAL DATA:  Nausea, vomiting. EXAM: CT ABDOMEN AND PELVIS WITHOUT CONTRAST TECHNIQUE: Multidetector CT imaging of the abdomen and pelvis was performed following the standard protocol without IV contrast. RADIATION DOSE REDUCTION: This exam was performed according to the departmental dose-optimization program which includes automated exposure control, adjustment of the mA and/or kV according to patient size and/or use of iterative reconstruction technique. COMPARISON:  September 17, 2023. FINDINGS: Lower chest: Minimal left lower lobe airspace opacity is noted suggesting possible inflammation or atelectasis. Hepatobiliary: No focal liver abnormality is seen. No gallstones, gallbladder wall thickening, or biliary dilatation. Pancreas: Unremarkable. No pancreatic ductal dilatation or surrounding inflammatory changes. Spleen: Normal in size without focal abnormality. Adrenals/Urinary Tract: Adrenal glands and kidneys appear normal. Urinary bladder is decompressed secondary to Foley catheter. Stomach/Bowel: Gastrostomy tube is in good position. There is no evidence of bowel obstruction or inflammation. The appendix appears normal. Large amount of stool seen in the rectum. Vascular/Lymphatic: Aortic atherosclerosis. No enlarged abdominal or pelvic lymph nodes. Reproductive: Prostate is unremarkable. Other: No ascites or hernia is noted. There is again noted large sacral decubitus ulcer although it is improved compared to prior exam. There is stable destruction of portions of the sacrum and coccyx related to chronic osteomyelitis. Musculoskeletal: Chronic findings as noted above. No acute osseous abnormality. IMPRESSION: Sacral decubitus ulcer is significantly smaller compared to prior exam, with stable destructive changes involving sacrum and coccyx consistent with chronic osteomyelitis. Gastrostomy tube is in grossly good position. Large amount of stool seen in rectum concerning for  impaction. Minimal left lower lobe airspace opacity is noted suggesting possible inflammation or atelectasis. Aortic Atherosclerosis (ICD10-I70.0). Electronically Signed   By: Lupita Raider M.D.   On: 11/01/2023 08:42   CT Head Wo Contrast Result Date: 11/01/2023 CLINICAL DATA:  Head trauma, skull fracture or hematoma. EXAM: CT HEAD WITHOUT CONTRAST TECHNIQUE: Contiguous axial images were obtained from the base of the skull through the vertex without intravenous contrast. RADIATION DOSE REDUCTION: This exam was performed according to the departmental dose-optimization program which includes automated exposure control, adjustment of the mA and/or kV according to patient size and/or use of iterative reconstruction technique. 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 The frontal parietal region is bulging outward at the craniectomy defect and anteriorly there is somewhat greater sunken curvature. No midline shift or hydrocephalus. No evidence of acute infarct, mass, or acute hemorrhage. Vascular: No acute finding. Skull: Normal. Negative for fracture or focal lesion. Sinuses/Orbits: No acute finding. IMPRESSION: No acute finding.  No hemorrhage or new infarct. Electronically Signed   By: Tiburcio Pea M.D.   On: 11/01/2023 08:33    PMH:   Past Medical History:  Diagnosis Date   A-fib (HCC)    Anemia  low iron   Chronic kidney disease    COVID    has had it 2 times, one mild and one wasn't   DM2 (diabetes mellitus, type 2) (HCC)    History of blood transfusion    HTN (hypertension)    Osteomyelitis of fifth toe of left foot (HCC) 08/13/2021   Osteomyelitis of fourth toe of left foot (HCC) 08/13/2021   Pneumonia    Stroke (HCC) 05/19/2021   no residual effects.   Stroke [facility])     PSH:   Past Surgical History:  Procedure Laterality Date   AMPUTATION Left 08/29/2021   Procedure:  AMPUTATION OF FOURTH TOE AND RAY ALONG WITH REMAINING FITH METATARSAL;  Surgeon: Tarry Kos, MD;  Location: [facility] OR;  Service: Orthopedics;  Laterality: Left;   AMPUTATION Left 09/03/2021   Procedure: LISFRANC AMPUTATION;  Surgeon: Tarry Kos, MD;  Location: [facility] OR;  Service: Orthopedics;  Laterality: Left;   AMPUTATION Left 10/29/2021   Procedure: AMPUTATION BELOW KNEE -LEFT;  Surgeon: Tarry Kos, MD;  Location: [facility] OR;  Service: Orthopedics;  Laterality: Left;   APPLICATION OF WOUND VAC Left 07/07/2021   Procedure: APPLICATION OF WOUND VAC;  Surgeon: Tarry Kos, MD;  Location: [facility] OR;  Service: Orthopedics;  Laterality: Left;   APPLICATION OF WOUND VAC Left 08/29/2021   Procedure: APPLICATION OF WOUND VAC;  Surgeon: Tarry Kos, MD;  Location: [facility] OR;  Service: Orthopedics;  Laterality: Left;   I & D EXTREMITY Left 07/03/2021   Procedure: IRRIGATION AND DEBRIDEMENT ,FIFTH RAY  AMPUTATION LEFT FOOT, , WOUND VAC PLACEMENT;  Surgeon: Tarry Kos, MD;  Location: [facility] OR;  Service: Orthopedics;  Laterality: Left;   I & D EXTREMITY Left 07/07/2021   Procedure: IRRIGATION AND DEBRIDEMENT LEFT FOOT;  Surgeon: Tarry Kos, MD;  Location: [facility] OR;  Service: Orthopedics;  Laterality: Left;   I & D EXTREMITY Left 08/29/2021   Procedure: IRRIGATION AND DEBRIDEMENT LEFT FOOT;  Surgeon: Tarry Kos, MD;  Location: [facility] OR;  Service: Orthopedics;  Laterality: Left;   IR FLUORO GUIDE CV LINE RIGHT  07/09/2021   IR REMOVAL TUN CV CATH W/O FL  10/08/2021   IR REMOVAL TUN CV CATH W/O FL  07/13/2023   IR REPLACE G-TUBE SIMPLE WO FLUORO  09/20/2023   IR US GUIDE VASC ACCESS RIGHT  07/09/2021   VITRECTOMY Left    [facility]    Allergies: No Known Allergies  Medications:   Prior to Admission medications   Medication Sig Start Date End Date Taking? Authorizing Provider  amiodarone (PACERONE) 100 MG tablet Take 100 mg by mouth daily.   Yes [provider]  amLODipine (NORVASC) 10 MG tablet  Take 10 mg by mouth daily.   Yes [provider]  amoxicillin-clavulanate (AUGMENTIN) 875-125 MG tablet Take 1 tablet by mouth 2 (two) times daily. 10/25/23  Yes [provider]  ascorbic acid (VITAMIN C) 500 MG tablet Take 1 tablet (500 mg total) by mouth 2 (two) times daily. 10/02/23  Yes [name], MD  ASHWAGANDHA PO Take 2 tablets by mouth daily as needed.   Yes [provider]  atorvastatin (LIPITOR) 40 MG tablet Take 40 mg by mouth at bedtime.   Yes [provider]  carvedilol (COREG) 25 MG tablet Take 1 tablet (25 mg total) by mouth 2 (two) times daily with a meal. 10/02/23  Yes [name], MD  cloNIDine (CATAPRES) 0.1 MG tablet Take 1 tablet (  0.1 mg total) by mouth daily. 10/02/23  Yes [name], MD  diphenhydramine-acetaminophen (TYLENOL PM) 25-500 MG TABS tablet Take 2 tablets by mouth at bedtime as needed.   Yes [provider]  finasteride (PROSCAR) 5 MG tablet Take 5 mg by mouth daily.   Yes [provider]  food thickener (SIMPLYTHICK, NECTAR/LEVEL 2/MILDLY THICK,) GEL Take 1 packet by mouth as needed. 10/02/23  Yes [name], MD  furosemide (LASIX) 40 MG tablet Take 40 mg by mouth daily.   Yes [provider]  glipiZIDE (GLUCOTROL) 5 MG tablet Take 1 tablet (5 mg total) by mouth daily. 05/21/21 11/01/23 Yes Sheikh, Omair Latif, DO  hydrALAZINE (APRESOLINE) 25 MG tablet Take 25 mg by mouth every 8 (eight) hours.   Yes [provider]  LANTUS SOLOSTAR 100 UNIT/ML Solostar Pen Inject 5 Units into the skin daily. 10/02/23  Yes [name], MD  levETIRAcetam (KEPPRA) 750 MG tablet Take 1,500 mg by mouth 2 (two) times daily.   Yes [provider]  losartan (COZAAR) 50 MG tablet Take 1 tablet (50 mg total) by mouth daily. 10/02/23  Yes [name], MD  melatonin 5 MG TABS Take 2 tablets by mouth at bedtime.   Yes [provider]  methocarbamol  (ROBAXIN) 500 MG tablet Take 1 tablet (500 mg total) by mouth every 8 (eight) hours as needed for muscle spasms. 10/02/23  Yes [name], MD  multivitamin (RENA-VIT) TABS tablet Take 1 tablet by mouth at bedtime. 10/02/23  Yes [name], MD  ondansetron (ZOFRAN-ODT) 4 MG disintegrating tablet Take 4 mg by mouth every 4 (four) hours as needed. 10/12/23  Yes [provider]  pantoprazole (PROTONIX) 40 MG tablet Take 1 tablet (40 mg total) by mouth daily. 05/22/21  Yes Sheikh, Omair Latif, DO  PARoxetine (PAXIL) 10 MG tablet Take 10 mg by mouth daily.   Yes [provider]  terazosin (HYTRIN) 1 MG capsule Take 1 mg by mouth at bedtime.   Yes [provider]  Vitamin D, Ergocalciferol, (DRISDOL) 1.25 MG (50000 UNIT) CAPS capsule Take 1 capsule (50,000 Units total) by mouth every 7 (seven) days. Patient taking differently: Take 50,000 Units by mouth every 7 (seven) days. On Thursdays 10/03/23  Yes [name], MD  albuterol (VENTOLIN HFA) 108 (90 Base) MCG/ACT inhaler Inhale 2 puffs into the lungs every 4 (four) hours as needed. Patient not taking: Reported on 11/01/2023 09/15/23   [provider]  feeding supplement (ENSURE ENLIVE / ENSURE PLUS) LIQD Take 237 mLs by mouth 2 (two) times daily between meals. Patient not taking: Reported on 11/01/2023 10/02/23   [name], MD  nutrition supplement, Heinz Knuckles, Heinz Knuckles) PACK Take 1 packet by mouth 2 (two) times daily between meals. Patient not taking: Reported on 11/01/2023 10/02/23   [name], MD    Discontinued Meds:   Medications Discontinued During This Encounter  Medication Reason   promethazine (PHENERGAN) 12.5 mg in sodium chloride 0.9 % 50 mL IVPB    nystatin (MYCOSTATIN) 100000 UNIT/ML suspension    ondansetron (ZOFRAN) 4 MG tablet    meropenem (MERREM) 500 mg in sodium chloride 0.9 % 100 mL IVPB    levETIRAcetam (KEPPRA) IVPB 1500 mg/ 100 mL premix    insulin  glargine-yfgn (SEMGLEE) injection 5 Units    insulin aspart (novoLOG) injection 0-5 Units    oxyCODONE (Oxy IR/ROXICODONE) immediate release tablet 5 mg     Social History:  reports that he has never smoked. He has never used smokeless tobacco. He reports that he does not currently use alcohol. He reports that he does not use drugs.  Family History:   Family History  Problem Relation Age of Onset   Stroke Mother    Cancer Mother    Heart disease Father     Blood pressure (!) 143/83, pulse 76, temperature 98 F (36.7 C), temperature source Oral, resp. rate 15, height 6\' 3"  (1.905 m), weight 105.2 kg, SpO2 99%. Physical Exam: General: Chronically ill appearing male, in no acute distress. Head: NCAT Neck: Supple. JVD not elevated. Lungs: CTA b/l Heart: RRR with S1 S2.  Abdomen: Soft, NABS. Peg tube in place. Foley catheter patent to gravity Lower extremities: L BKA, No RLE edema Skin: Sacral decub with wound vac in place Neuro: Alert and oriented X 3. Moves all extremities spontaneously. Psych:  Responds to questions appropriately with a normal affect. Dialysis Access: RIJ TDC     Zamya Culhane, Len Blalock, MD 11/03/2023, 7:54 AM

## 2023-11-03 NOTE — Discharge Planning (Signed)
[facility] Patient Discharge Orders- [facility] CLINIC: [place]  Patient's name: Bruce Little Admit/DC Dates: 11/01/2023 - Discharge order 11/03/2023  Discharge Diagnoses: Covid 19  Recurrent N&V  Aranesp: Given: No   Date and amount of last dose: NA  Last Hgb: 8.9 PRBC's Given: No Date/# of units: NA ESA dose for discharge: mircera 150 mcg IV q 2 weeks  IV Iron dose at discharge: Per protocol  Heparin change: No  EDW Change: No New EDW: NA  Bath Change: No  Access intervention/Change: No Details:  Hectorol/Calcitriol change: Yes Hold VDRA-Corrected calcium 10.2  Discharge Labs: Calcium 8.5 Phosphorus 4.0 Albumin 1.9 K+ 3.4  IV Antibiotics: No Details:  On Coumadin?: No Last INR: Next INR: Managed By:   OTHER/APPTS/LAB ORDERS:     D/C Meds to be reconciled by nurse after every discharge.  Completed By: Alonna Buckler [facility] [phone]    Reviewed by: MD:______ RN_______

## 2023-11-03 NOTE — Discharge Summary (Signed)
[facility]  Physician Discharge Summary   Patient ID: Bruce Little MRN: [id number] DOB/AGE: 1970-12-19 52 y.o.  Admit date: 11/01/2023 Discharge date: 11/03/2023    PCP: Street, Stephanie Coup, MD  DISCHARGE DIAGNOSES:    Acute COVID-19 Nausea and vomiting, improved Constipation, resolved End-stage renal disease on hemodialysis Abnormal UA suggesting colonization Chronic Foley catheter History of atrial fibrillation Diabetes mellitus type 2 Oropharyngeal dysphagia Stage IV sacral decubitus   RECOMMENDATIONS FOR OUTPATIENT FOLLOW UP: PCP to address hepatitis B vaccination considering low titers Patient to continue with his dialysis schedule   Home Health: Resume home health PT OT RN speech Equipment/Devices: None  CODE STATUS: Full code  DISCHARGE CONDITION: fair  Diet recommendation: As before  INITIAL HISTORY:  52 year old male with past medical history of intracranial bleed status post craniectomy in [place] in June 2024.  Chronic kidney disease now end-stage renal disease.  Sacral decubitus, PEG tube, chronic Foley with recurrent UTIs, history of diabetes, essential hypertension, dyslipidemia, atrial fibrillation who presented with intractable nausea and vomiting.  He has chronic nausea vomiting but this was thought to be worse than his usual.  Noted to be incidentally positive for COVID-19.  He was hospitalized for further management.   Consultants: Nephrology   Procedures: Dialysis  HOSPITAL COURSE:   COVID-19 infection No fever noted.  Not hypoxic.  Chest x-ray did not show any opacities. Appears to be asymptomatic for the most part.  No indication for any antivirals at this time.  No indication for steroids.  Stable at the time of discharge.   Recurrent nausea and vomiting He has chronic nausea and vomiting at baseline but apparently worse over the past few days.  CT of the abdomen pelvis did not show any acute findings except for a large amount  of stool in the rectum which could be contributing. Symptom is likely due to COVID-19 along with constipation.  Resolved by the time of discharge.  Tolerating diet.   Significant constipation with possible fecal impaction CT scan showed significant stool in the rectum.  According to H&P patient had a large bowel movement in the ED. TSH was normal.  He will be discharged on laxatives.   History of intracranial bleed status post craniectomy Stable.  Continue Keppra.     End-stage renal disease Seen by nephrology and dialyzed in the hospital.   Abnormal UA in the setting of chronic Foley catheter Apparently recently treated for Pseudomonas UTI.  He is likely colonized.  Does not have any systemic signs of infection.  Was not treated with antibiotics here in the hospital.     Essential hypertension   History of atrial fibrillation No longer on Eliquis.  On amiodarone.   Diabetes mellitus type 2 on insulin   History of dysphagia Has a PEG tube.  However he is able to tolerate a dysphagia 3 diet at home with nectar thick liquids.  Uses PEG tube primarily for supplemental fluids.   Sacral decubitus stage IV Seems to be better based on CT report.   Uses wound VAC at home.  Continue with home health. Pressure Injury 11/01/23 Buttocks Left Unstageable - Full thickness tissue loss in which the base of the injury is covered by slough (yellow, tan, gray, green or brown) and/or eschar (tan, brown or black) in the wound bed. (Active)  11/01/23   Location: Buttocks  Location Orientation: Left  Staging: Unstageable - Full thickness tissue loss in which the base of the injury is covered by slough (yellow, tan,  gray, green or brown) and/or eschar (tan, brown or black) in the wound bed.  Wound Description (Comments):   Present on Admission: Yes     Pressure Injury 11/01/23 Sacrum Stage 4 - Full thickness tissue loss with exposed bone, tendon or muscle. (Active)  11/01/23   Location: Sacrum   Location Orientation:   Staging: Stage 4 - Full thickness tissue loss with exposed bone, tendon or muscle.  Wound Description (Comments):   Present on Admission: Yes   Patient is stable.  Okay for discharge home.  Discussed with his wife.   PERTINENT LABS:  The results of significant diagnostics from this hospitalization (including imaging, microbiology, ancillary and laboratory) are listed below for reference.    Microbiology: Recent Results (from the past 240 hours)  Resp panel by RT-PCR (RSV, Flu A&B, Covid) Urine, Catheterized     Status: Abnormal   Collection Time: 11/01/23  8:06 AM   Specimen: Urine, Catheterized; Nasal Swab  Result Value Ref Range Status   SARS Coronavirus 2 by RT PCR POSITIVE (A) NEGATIVE Final   Influenza A by PCR NEGATIVE NEGATIVE Final   Influenza B by PCR NEGATIVE NEGATIVE Final    Comment: (NOTE) The Xpert Xpress SARS-CoV-2/FLU/RSV plus assay is intended as an aid in the diagnosis of influenza from Nasopharyngeal swab specimens and should not be used as a sole basis for treatment. Nasal washings and aspirates are unacceptable for Xpert Xpress SARS-CoV-2/FLU/RSV testing.  Fact Sheet for Patients: BloggerCourse.com  Fact Sheet for Healthcare Providers: SeriousBroker.it  This test is not yet approved or cleared by the [place] FDA and has been authorized for detection and/or diagnosis of SARS-CoV-2 by FDA under an Emergency Use Authorization (EUA). This EUA will remain in effect (meaning this test can be used) for the duration of the COVID-19 declaration under Section 564(b)(1) of the Act, 21 U.S.C. section 360bbb-3(b)(1), unless the authorization is terminated or revoked.     Resp Syncytial Virus by PCR NEGATIVE NEGATIVE Final    Comment: (NOTE) Fact Sheet for Patients: BloggerCourse.com  Fact Sheet for Healthcare  Providers: SeriousBroker.it  This test is not yet approved or cleared by the [place] FDA and has been authorized for detection and/or diagnosis of SARS-CoV-2 by FDA under an Emergency Use Authorization (EUA). This EUA will remain in effect (meaning this test can be used) for the duration of the COVID-19 declaration under Section 564(b)(1) of the Act, 21 U.S.C. section 360bbb-3(b)(1), unless the authorization is terminated or revoked.  Performed at [facility], [address], [place], [place] [address]   Urine Culture     Status: Abnormal   Collection Time: 11/01/23  8:06 AM   Specimen: Urine, Clean Catch  Result Value Ref Range Status   Specimen Description URINE, CLEAN CATCH  Final   Special Requests   Final    NONE Performed at [facility], [address], [place], [place] [address]    Culture >=100,000 COLONIES/mL PSEUDOMONAS AERUGINOSA (A)  Final   Report Status 11/03/2023 FINAL  Final   Organism ID, Bacteria PSEUDOMONAS AERUGINOSA (A)  Final      Susceptibility   Pseudomonas aeruginosa - MIC*    CEFTAZIDIME 16 INTERMEDIATE Intermediate     CIPROFLOXACIN 1 INTERMEDIATE Intermediate     GENTAMICIN <=1 SENSITIVE Sensitive     IMIPENEM 1 SENSITIVE Sensitive     * >=100,000 COLONIES/mL PSEUDOMONAS AERUGINOSA     Labs:   Basic Metabolic Panel: Recent Labs  Lab 11/01/23 [id number]  11/02/23 0454 11/03/23 0422  NA 134* 133* 134*  K 3.4* 3.2* 3.4*  CL 90* 94* 98  CO2 27 28 25   GLUCOSE 122* 111* 82  BUN 18 20 22*  CREATININE 3.29* 3.53* 2.41*  CALCIUM 8.6* 8.5* 8.5*  MG  --  1.9  --   PHOS  --  4.0  --    Liver Function Tests: Recent Labs  Lab 11/01/23 0620 11/02/23 0454  AST 27  --   ALT 28  --   ALKPHOS 120  --   BILITOT 0.7  --   PROT 6.7  --   ALBUMIN 2.0* 1.9*   Recent Labs  Lab 11/01/23 0620  LIPASE 49    CBC: Recent Labs  Lab 11/01/23 0620 11/03/23 0422  WBC 16.2* 14.2*  NEUTROABS 14.3*  --   HGB  10.0* 8.9*  HCT 30.6* 27.7*  MCV 97.8 97.5  PLT 408* 373     CBG: Recent Labs  Lab 11/02/23 0809 11/02/23 1201 11/02/23 1657 11/02/23 2117 11/03/23 0836  GLUCAP 92 96 112* 90 103*     IMAGING STUDIES CT ABDOMEN PELVIS WO CONTRAST Result Date: 11/01/2023 CLINICAL DATA:  Nausea, vomiting. EXAM: CT ABDOMEN AND PELVIS WITHOUT CONTRAST TECHNIQUE: Multidetector CT imaging of the abdomen and pelvis was performed following the standard protocol without IV contrast. RADIATION DOSE REDUCTION: This exam was performed according to the departmental dose-optimization program which includes automated exposure control, adjustment of the mA and/or kV according to patient size and/or use of iterative reconstruction technique. COMPARISON:  September 17, 2023. FINDINGS: Lower chest: Minimal left lower lobe airspace opacity is noted suggesting possible inflammation or atelectasis. Hepatobiliary: No focal liver abnormality is seen. No gallstones, gallbladder wall thickening, or biliary dilatation. Pancreas: Unremarkable. No pancreatic ductal dilatation or surrounding inflammatory changes. Spleen: Normal in size without focal abnormality. Adrenals/Urinary Tract: Adrenal glands and kidneys appear normal. Urinary bladder is decompressed secondary to Foley catheter. Stomach/Bowel: Gastrostomy tube is in good position. There is no evidence of bowel obstruction or inflammation. The appendix appears normal. Large amount of stool seen in the rectum. Vascular/Lymphatic: Aortic atherosclerosis. No enlarged abdominal or pelvic lymph nodes. Reproductive: Prostate is unremarkable. Other: No ascites or hernia is noted. There is again noted large sacral decubitus ulcer although it is improved compared to prior exam. There is stable destruction of portions of the sacrum and coccyx related to chronic osteomyelitis. Musculoskeletal: Chronic findings as noted above. No acute osseous abnormality. IMPRESSION: Sacral decubitus ulcer is  significantly smaller compared to prior exam, with stable destructive changes involving sacrum and coccyx consistent with chronic osteomyelitis. Gastrostomy tube is in grossly good position. Large amount of stool seen in rectum concerning for impaction. Minimal left lower lobe airspace opacity is noted suggesting possible inflammation or atelectasis. Aortic Atherosclerosis (ICD10-I70.0). Electronically Signed   By: Lupita Raider M.D.   On: 11/01/2023 08:42   CT Head Wo Contrast Result Date: 11/01/2023 CLINICAL DATA:  Head trauma, skull fracture or hematoma. EXAM: CT HEAD WITHOUT CONTRAST TECHNIQUE: Contiguous axial images were obtained from the base of the skull through the vertex without intravenous contrast. RADIATION DOSE REDUCTION: This exam was performed according to the departmental dose-optimization program which includes automated exposure control, adjustment of the mA and/or kV according to patient size and/or use of iterative reconstruction technique. 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 The frontal parietal region is bulging outward at the craniectomy defect  and anteriorly there is somewhat greater sunken curvature. No midline shift or hydrocephalus. No evidence of acute infarct, mass, or acute hemorrhage. Vascular: No acute finding. Skull: Normal. Negative for fracture or focal lesion. Sinuses/Orbits: No acute finding. IMPRESSION: No acute finding.  No hemorrhage or new infarct. Electronically Signed   By: Tiburcio Pea M.D.   On: 11/01/2023 08:33   DG ABD ACUTE 2+V W 1V CHEST Result Date: 11/01/2023 CLINICAL DATA:  Vomiting. EXAM: DG ABDOMEN ACUTE WITH 1 VIEW CHEST COMPARISON:  View chest x-ray 11/01/2023 FINDINGS: A right IJ dialysis catheter is stable. The heart size is normal. Lung volumes are low. No focal airspace disease is present. Bowel gas pattern is normal. No obstruction  or free air is present. Mild degenerative changes are present in the lower lumbar spine. IMPRESSION: 1. No acute cardiopulmonary disease. 2. Normal bowel gas pattern. Electronically Signed   By: Marin Roberts M.D.   On: 11/01/2023 06:55    DISCHARGE EXAMINATION: Vitals:   11/02/23 1128 11/02/23 1132 11/02/23 1645 11/03/23 0033  BP: (!) 149/82  138/80 (!) 143/83  Pulse: 86  85 76  Resp: 16  15   Temp:   97.8 F (36.6 C) 98 F (36.7 C)  TempSrc:   Axillary Oral  SpO2: 100%  99% 99%  Weight:  105.2 kg    Height:       General appearance: Awake alert.  In no distress Resp: Clear to auscultation bilaterally.  Normal effort Cardio: S1-S2 is normal regular.  No S3-S4.  No rubs murmurs or bruit   DISPOSITION: Home  Discharge Instructions     Call MD for:  difficulty breathing, headache or visual disturbances   Complete by: As directed    Call MD for:  extreme fatigue   Complete by: As directed    Call MD for:  persistant dizziness or light-headedness   Complete by: As directed    Call MD for:  persistant nausea and vomiting   Complete by: As directed    Call MD for:  severe uncontrolled pain   Complete by: As directed    Call MD for:  temperature >100.4   Complete by: As directed    Discharge instructions   Complete by: As directed    Please take your medications as prescribed.  Please keep up with your dialysis schedule.  Talk to your outpatient providers about hepatitis B vaccination in a few weeks.  You were cared for by a hospitalist during your hospital stay. If you have any questions about your discharge medications or the care you received while you were in the hospital after you are discharged, you can call the unit and asked to speak with the hospitalist on call if the hospitalist that took care of you is not available. Once you are discharged, your primary care physician will handle any further medical issues. Please note that NO REFILLS for any discharge  medications will be authorized once you are discharged, as it is imperative that you return to your primary care physician (or establish a relationship with a primary care physician if you do not have one) for your aftercare needs so that they can reassess your need for medications and monitor your lab values. If you do not have a primary care physician, you can call [phone] for a physician referral.   Discharge wound care:   Complete by: As directed    Wound care  Daily      Comments: Interchangeable with [facility] Apply Medihoney  to left buttock wound Q days or PRN soiling Continue with wound VAC.   Increase activity slowly   Complete by: As directed          Allergies as of 11/03/2023   No Known Allergies      Medication List     STOP taking these medications    albuterol 108 (90 Base) MCG/ACT inhaler Commonly known as: VENTOLIN HFA   amoxicillin-clavulanate 875-125 MG tablet Commonly known as: AUGMENTIN   feeding supplement Liqd   leptospermum manuka honey Pste paste   nutrition supplement (JUVEN) Pack   ondansetron 4 MG disintegrating tablet Commonly known as: ZOFRAN-ODT       TAKE these medications    amiodarone 100 MG tablet Commonly known as: PACERONE Take 100 mg by mouth daily.   amLODipine 10 MG tablet Commonly known as: NORVASC Take 10 mg by mouth daily.   ascorbic acid 500 MG tablet Commonly known as: VITAMIN C Take 1 tablet (500 mg total) by mouth 2 (two) times daily.   ASHWAGANDHA PO Take 2 tablets by mouth daily as needed.   atorvastatin 40 MG tablet Commonly known as: LIPITOR Take 40 mg by mouth at bedtime.   carvedilol 25 MG tablet Commonly known as: COREG Take 1 tablet (25 mg total) by mouth 2 (two) times daily with a meal.   cloNIDine 0.1 MG tablet Commonly known as: CATAPRES Take 1 tablet (0.1 mg total) by mouth daily.   diphenhydramine-acetaminophen 25-500 MG Tabs tablet Commonly known as: TYLENOL PM Take 2 tablets by  mouth at bedtime as needed.   finasteride 5 MG tablet Commonly known as: PROSCAR Take 5 mg by mouth daily.   food thickener Gel Commonly known as: SIMPLYTHICK (NECTAR/LEVEL 2/MILDLY THICK) Take 1 packet by mouth as needed.   furosemide 40 MG tablet Commonly known as: LASIX Take 40 mg by mouth daily.   glipiZIDE 5 MG tablet Commonly known as: Glucotrol Take 1 tablet (5 mg total) by mouth daily.   hydrALAZINE 25 MG tablet Commonly known as: APRESOLINE Take 25 mg by mouth every 8 (eight) hours.   Lantus SoloStar 100 UNIT/ML Solostar Pen Generic drug: insulin glargine Inject 5 Units into the skin daily.   levETIRAcetam 750 MG tablet Commonly known as: KEPPRA Take 1,500 mg by mouth 2 (two) times daily.   losartan 50 MG tablet Commonly known as: COZAAR Take 1 tablet (50 mg total) by mouth daily.   melatonin 5 MG Tabs Take 2 tablets by mouth at bedtime.   methocarbamol 500 MG tablet Commonly known as: ROBAXIN Take 1 tablet (500 mg total) by mouth every 8 (eight) hours as needed for muscle spasms.   multivitamin Tabs tablet Take 1 tablet by mouth at bedtime.   pantoprazole 40 MG tablet Commonly known as: PROTONIX Take 1 tablet (40 mg total) by mouth daily.   PARoxetine 10 MG tablet Commonly known as: PAXIL Take 10 mg by mouth daily.   polyethylene glycol 17 g packet Commonly known as: MIRALAX / GLYCOLAX Take 17 g by mouth 2 (two) times daily.   promethazine 12.5 MG tablet Commonly known as: PHENERGAN Take 1 tablet (12.5 mg total) by mouth every 6 (six) hours as needed for nausea or vomiting.   senna-docusate 8.6-50 MG tablet Commonly known as: Senokot-S Take 2 tablets by mouth 2 (two) times daily.   terazosin 1 MG capsule Commonly known as: HYTRIN Take 1 mg by mouth at bedtime.   Vitamin D (Ergocalciferol) 1.25 MG (50000 UNIT) Caps  capsule Commonly known as: DRISDOL Take 1 capsule (50,000 Units total) by mouth every 7 (seven) days. What changed:  additional instructions               Discharge Care Instructions  (From admission, onward)           Start     Ordered   11/03/23 0000  Discharge wound care:       Comments: Wound care  Daily      Comments: Interchangeable with TheraHoney Apply Medihoney to left buttock wound Q days or PRN soiling Continue with wound VAC.   11/03/23 0848               TOTAL DISCHARGE TIME: 35 minutes  Montreal Steidle [employer] on [email or web address]  11/03/2023, 6:37 PM

## 2023-11-03 NOTE — TOC Transition Note (Signed)
Transition of Care [facility]) - Discharge Note   Patient Details  Name: Bruce Little MRN: [id number] Date of Birth: 1971/02/06  [facility] CM/SW Contact:  Leone Haven, RN Phone Number: 11/03/2023, 9:50 AM   Clinical Narrative:    For dc today, per wife he is active with [facility] and would like to continue , he has HHRN, HHPT, HHOT, HHST, NCM asked MD for orders to resume.  Wife will transport him home,  per wife he has wound vac at home and the [facility] was changing it at home.          Patient Goals and CMS Choice            Discharge Placement                       Discharge Plan and Services Additional resources added to the After Visit Summary for                                       Social Drivers of Health (SDOH) Interventions SDOH Screenings   Food Insecurity: No Food Insecurity (11/01/2023)  Housing: Low Risk  (11/01/2023)  Transportation Needs: No Transportation Needs (11/01/2023)  Utilities: Not At Risk (11/01/2023)  Depression (PHQ2-9): Low Risk  (08/27/2021)  Financial Resource Strain: Low Risk  (08/10/2023)   Received from [facility]  Social Connections: Unknown (08/13/2023)   Received from [facility]  Stress: No Stress Concern Present (08/10/2023)   Received from [facility]  Tobacco Use: Low Risk  (10/18/2023)     Readmission Risk Interventions    09/22/2023   11:18 AM 07/11/2021    3:04 PM  Readmission Risk Prevention Plan  Transportation Screening Complete Complete  PCP or Specialist Appt within 5-7 Days Complete Complete  Home Care Screening Complete Complete  Medication Review (RN CM) Referral to Pharmacy Complete

## 2023-11-04 ENCOUNTER — Telehealth: Payer: Self-pay | Admitting: Nurse Practitioner

## 2023-11-04 NOTE — Progress Notes (Signed)
Late Note Entry- Nov 04, 2023  Pt was d/c yesterday. Contacted [facility] this morning to be advised of pt's d/c date and that pt should resume care today.   Olivia Canter Renal Navigator [phone]

## 2023-11-04 NOTE — Telephone Encounter (Signed)
Transition of Care - Initial Contact from Inpatient Facility  Date of discharge: 11/02/2023 Date of contact: 11/04/2023 Method: Phone Spoke to: Spouse  Patient contacted to discuss transition of care from recent inpatient hospitalization. Patient was admitted to [facility] from 11/01/2023 -11/03/2023  with discharge diagnosis of Covid 19/Nausea/Vomiting  The discharge medication list was reviewed. Spouse understands the changes and has no concerns. Spouse is asking for referral to urology for possible suprapubic catheter and she is still concerned about chronic issues with nausea. Reviewed all medications. She plans to reach out to PCP soon    Patient will return to his/her outpatient HD unit on: 11/04/2023   No other concerns at this time.

## 2023-11-05 LAB — VITAMIN C: Vitamin C: 3.5 mg/dL — ABNORMAL HIGH (ref 0.4–2.0)

## 2023-11-06 LAB — VITAMIN E
Vitamin E (Alpha Tocopherol): 10 mg/L (ref 7.0–25.1)
Vitamin E(Gamma Tocopherol): 0.4 mg/L — ABNORMAL LOW (ref 0.5–5.5)

## 2023-11-06 LAB — VITAMIN A: Vitamin A (Retinoic Acid): 62.2 ug/dL — ABNORMAL HIGH (ref 20.1–62.0)

## 2023-11-08 ENCOUNTER — Encounter (HOSPITAL_COMMUNITY): Payer: Self-pay

## 2023-11-17 ENCOUNTER — Encounter (HOSPITAL_COMMUNITY): Payer: Self-pay

## 2023-11-17 ENCOUNTER — Ambulatory Visit: Payer: 59 | Admitting: Vascular Surgery

## 2023-11-17 ENCOUNTER — Encounter: Payer: Self-pay | Admitting: Vascular Surgery

## 2023-11-17 VITALS — BP 110/76 | HR 85 | Temp 97.9°F

## 2023-11-17 DIAGNOSIS — N186 End stage renal disease: Secondary | ICD-10-CM | POA: Diagnosis not present

## 2023-11-17 NOTE — Progress Notes (Signed)
 Patient ID: Bruce Little, male   DOB: 01-Oct-1971, 53 y.o.   MRN: [id number]  Reason for Consult: Follow-up   Referred by Macel Jayson PARAS, MD  Subjective:     HPI:  Bruce Little is a 53 y.o. male history of stroke currently wheelchair-bound with paralysis of his left upper extremity.  He now has end-stage renal disease on dialysis via right IJ catheter.  He is here today to discuss peritoneal dialysis versus hemodialysis permanent access.  Patient does have a G-tube and possibly will require suprapubic Foley catheter as he currently has an indwelling catheter.  He previously has had his tracheostomy this has been removed he has also had craniectomy and the bone flap has not been replaced.  He does take aspirin  for life who seeks on his behalf does not know of any blood thinners.  Past Medical History:  Diagnosis Date   A-fib (HCC)    Anemia    low iron   Chronic kidney disease    COVID    has had it 2 times, one mild and one wasn't   DM2 (diabetes mellitus, type 2) (HCC)    History of blood transfusion    HTN (hypertension)    Osteomyelitis of fifth toe of left foot (HCC) 08/13/2021   Osteomyelitis of fourth toe of left foot (HCC) 08/13/2021   Pneumonia    Stroke (HCC) 05/19/2021   no residual effects.   Stroke [facility])    Family History  Problem Relation Age of Onset   Stroke Mother    Cancer Mother    Heart disease Father    Past Surgical History:  Procedure Laterality Date   AMPUTATION Left 08/29/2021   Procedure: AMPUTATION OF FOURTH TOE AND RAY ALONG WITH REMAINING FITH METATARSAL;  Surgeon: Jerri Kay HERO, MD;  [facility];  Service: Orthopedics;  Laterality: Left;   AMPUTATION Left 09/03/2021   Procedure: LISFRANC AMPUTATION;  Surgeon: Jerri Kay HERO, MD;  [facility];  Service: Orthopedics;  Laterality: Left;   AMPUTATION Left 10/29/2021   Procedure: AMPUTATION BELOW KNEE -LEFT;  Surgeon: Jerri Kay HERO, MD;  [facility];  Service: Orthopedics;  Laterality:  Left;   APPLICATION OF WOUND VAC Left 07/07/2021   Procedure: APPLICATION OF WOUND VAC;  Surgeon: Jerri Kay HERO, MD;  [facility];  Service: Orthopedics;  Laterality: Left;   APPLICATION OF WOUND VAC Left 08/29/2021   Procedure: APPLICATION OF WOUND VAC;  Surgeon: Jerri Kay HERO, MD;  [facility];  Service: Orthopedics;  Laterality: Left;   I & D EXTREMITY Left 07/03/2021   Procedure: IRRIGATION AND DEBRIDEMENT ,FIFTH RAY  AMPUTATION LEFT FOOT, , WOUND VAC PLACEMENT;  Surgeon: Jerri Kay HERO, MD;  [facility];  Service: Orthopedics;  Laterality: Left;   I & D EXTREMITY Left 07/07/2021   Procedure: IRRIGATION AND DEBRIDEMENT LEFT FOOT;  Surgeon: Jerri Kay HERO, MD;  [facility];  Service: Orthopedics;  Laterality: Left;   I & D EXTREMITY Left 08/29/2021   Procedure: IRRIGATION AND DEBRIDEMENT LEFT FOOT;  Surgeon: Jerri Kay HERO, MD;  [facility];  Service: Orthopedics;  Laterality: Left;   IR FLUORO GUIDE CV LINE RIGHT  07/09/2021   IR REMOVAL TUN CV CATH W/O FL  10/08/2021   IR REMOVAL TUN CV CATH W/O FL  07/13/2023   IR REPLACE G-TUBE SIMPLE WO FLUORO  09/20/2023   IR US  GUIDE VASC ACCESS RIGHT  07/09/2021   VITRECTOMY Left  [place] [facility]    Short Social History:  Social History   Tobacco Use   Smoking status: Never   Smokeless tobacco: Never  Substance Use Topics   Alcohol use: Not Currently    Comment: rare    No Known Allergies  Current Outpatient Medications  Medication Sig Dispense Refill   amiodarone  (PACERONE ) 100 MG tablet Take 100 mg by mouth daily.     amLODipine  (NORVASC ) 10 MG tablet Take 10 mg by mouth daily.     ascorbic acid  (VITAMIN C ) 500 MG tablet Take 1 tablet (500 mg total) by mouth 2 (two) times daily. 60 tablet 0   ASHWAGANDHA PO Take 2 tablets by mouth daily as needed.     atorvastatin  (LIPITOR ) 40 MG tablet Take 40 mg by mouth at bedtime.     carvedilol  (COREG ) 25 MG tablet Take 1 tablet (25 mg total) by mouth 2 (two) times daily with a  meal. 60 tablet 0   cloNIDine  (CATAPRES ) 0.1 MG tablet Take 1 tablet (0.1 mg total) by mouth daily. 30 tablet 0   diphenhydramine -acetaminophen  (TYLENOL  PM) 25-500 MG TABS tablet Take 2 tablets by mouth at bedtime as needed.     finasteride  (PROSCAR ) 5 MG tablet Take 5 mg by mouth daily.     food thickener (SIMPLYTHICK, NECTAR/LEVEL 2/MILDLY THICK,) GEL Take 1 packet by mouth as needed. 200 packet 0   furosemide  (LASIX ) 40 MG tablet Take 40 mg by mouth daily.     hydrALAZINE  (APRESOLINE ) 25 MG tablet Take 25 mg by mouth every 8 (eight) hours.     LANTUS  SOLOSTAR 100 UNIT/ML Solostar Pen Inject 5 Units into the skin daily. 15 mL 0   levETIRAcetam  (KEPPRA ) 750 MG tablet Take 1,500 mg by mouth 2 (two) times daily.     losartan  (COZAAR ) 50 MG tablet Take 1 tablet (50 mg total) by mouth daily. 30 tablet 0   melatonin 5 MG TABS Take 2 tablets by mouth at bedtime.     methocarbamol  (ROBAXIN ) 500 MG tablet Take 1 tablet (500 mg total) by mouth every 8 (eight) hours as needed for muscle spasms. 30 tablet 0   multivitamin (RENA-VIT) TABS tablet Take 1 tablet by mouth at bedtime. 30 tablet 0   pantoprazole  (PROTONIX ) 40 MG tablet Take 1 tablet (40 mg total) by mouth daily. 30 tablet 0   PARoxetine  (PAXIL ) 10 MG tablet Take 10 mg by mouth daily.     polyethylene glycol (MIRALAX  / GLYCOLAX ) 17 g packet Take 17 g by mouth 2 (two) times daily. 60 each 0   promethazine  (PHENERGAN ) 12.5 MG tablet Take 1 tablet (12.5 mg total) by mouth every 6 (six) hours as needed for nausea or vomiting. 30 tablet 0   senna-docusate (SENOKOT-S) 8.6-50 MG tablet Take 2 tablets by mouth 2 (two) times daily. 120 tablet 0   terazosin  (HYTRIN ) 1 MG capsule Take 1 mg by mouth at bedtime.     Vitamin D , Ergocalciferol , (DRISDOL ) 1.25 MG (50000 UNIT) CAPS capsule Take 1 capsule (50,000 Units total) by mouth every 7 (seven) days. (Patient taking differently: Take 50,000 Units by mouth every 7 (seven) days. On Thursdays) 4 capsule 0    glipiZIDE  (GLUCOTROL ) 5 MG tablet Take 1 tablet (5 mg total) by mouth daily. 30 tablet 0   No current facility-administered medications for this visit.    Review of Systems  Constitutional:  Constitutional negative. HENT: HENT negative.  Eyes: Eyes negative.  Cardiovascular: Cardiovascular negative.  GI: Gastrointestinal negative.  GU:       Indwelling foley Skin: Skin negative.  Neurological: Positive for facial asymmetry, focal weakness, numbness and speech difficulty.  Hematologic: Hematologic/lymphatic negative.  Psychiatric: Psychiatric negative.        Objective:  Objective   Vitals:   11/17/23 1550  BP: 110/76  Pulse: 85  Temp: 97.9 F (36.6 C)  SpO2: 98%   There is no height or weight on file to calculate BMI.  Physical Exam Neck:     Comments: Right internal jugular tdc Cardiovascular:     Comments: Bilateral brachial pulses palpable Abdominal:     General: Abdomen is flat.     Comments: G tube in place  Neurological:     Mental Status: He is alert.     Sensory: Sensory deficit present.     Comments: Paralysis LUE     Data: Right Cephalic   Diameter (cm)Depth (cm)Findings   +-----------------+-------------+----------+---------+  Shoulder            0.46        1.22              +-----------------+-------------+----------+---------+  Prox upper arm       0.36        0.59              +-----------------+-------------+----------+---------+  Mid upper arm        0.37        0.36              +-----------------+-------------+----------+---------+  Dist upper arm       0.40        0.42   branching  +-----------------+-------------+----------+---------+  Antecubital fossa    0.63        0.15   branching  +-----------------+-------------+----------+---------+  Prox forearm         0.45        0.46              +-----------------+-------------+----------+---------+  Mid forearm          0.41        0.41               +-----------------+-------------+----------+---------+  Dist forearm         0.34        0.22              +-----------------+-------------+----------+---------+  Wrist               0.37        0.22   branching  +-----------------+-------------+----------+---------+   +-----------------+-------------+----------+--------------+  Right Basilic    Diameter (cm)Depth (cm)   Findings     +-----------------+-------------+----------+--------------+  Shoulder                               not visualized  +-----------------+-------------+----------+--------------+  Prox upper arm                          not visualized  +-----------------+-------------+----------+--------------+  Mid upper arm                           not visualized  +-----------------+-------------+----------+--------------+  Dist upper arm       0.80        1.33     branching     +-----------------+-------------+----------+--------------+  Antecubital fossa    0.78  1.50     branching     +-----------------+-------------+----------+--------------+  Prox forearm         0.42        0.18     branching     +-----------------+-------------+----------+--------------+  Mid forearm          0.41        0.18                   +-----------------+-------------+----------+--------------+  Distal forearm       0.34        0.22                   +-----------------+-------------+----------+--------------+  Wrist               0.30        0.21                   +-----------------+-------------+----------+--------------+   +-----------------+-------------+----------+---------+  Left Cephalic    Diameter (cm)Depth (cm)Findings   +-----------------+-------------+----------+---------+  Shoulder            0.30        1.68              +-----------------+-------------+----------+---------+  Prox upper arm       0.30        0.90               +-----------------+-------------+----------+---------+  Mid upper arm        0.30        0.43              +-----------------+-------------+----------+---------+  Dist upper arm       0.29        0.52   branching  +-----------------+-------------+----------+---------+  Antecubital fossa    0.47        0.53   branching  +-----------------+-------------+----------+---------+  Prox forearm         0.19        0.71   branching  +-----------------+-------------+----------+---------+  Mid forearm          0.24        0.41              +-----------------+-------------+----------+---------+  Dist forearm         0.15        0.21   branching  +-----------------+-------------+----------+---------+  Wrist               0.15        0.23              +-----------------+-------------+----------+---------+   +-----------------+-------------+----------+--------------+  Left Basilic     Diameter (cm)Depth (cm)   Findings     +-----------------+-------------+----------+--------------+  Shoulder                               not visualized  +-----------------+-------------+----------+--------------+  Prox upper arm       0.51        1.47     branching     +-----------------+-------------+----------+--------------+  Mid upper arm        0.39        1.14                   +-----------------+-------------+----------+--------------+  Dist upper arm       0.46        0.79                   +-----------------+-------------+----------+--------------+  Antecubital fossa    0.70        0.39     branching     +-----------------+-------------+----------+--------------+  Prox forearm         0.33        0.32                   +-----------------+-------------+----------+--------------+  Mid forearm          0.30        0.35                   +-----------------+-------------+----------+--------------+  Distal forearm       0.26         0.25                   +-----------------+-------------+----------+--------------+  Wrist               0.30        0.23     branching     +-----------------+-------------+----------+--------------+   *See table(s) above for measurements and observations.         Assessment/Plan:    53 year old male with end-stage renal disease on dialysis Tuesdays, Thursdays and Saturdays via right IJ tunneled catheter.  We have discussed his options for permanent dialysis access which include peritoneal versus hemodialysis.  Unfortunately given his presence of a G-tube on possible need for suprapubic Foley catheter I think he would be exceedingly high risk for peritoneal dialysis catheter placement.  As such we have discussed hemodialysis access and given that his left arm is paralyzed we will plan for left upper arm AV fistula versus graft.  We discussed the possibility of primary nonfunction versus steal versus need for further procedures and the wife demonstrates very good understanding.  Will schedule this on a nondialysis day in the near future.     Penne Lonni Colorado MD [facility]

## 2023-11-26 ENCOUNTER — Telehealth: Payer: Self-pay | Admitting: *Deleted

## 2023-11-26 NOTE — Telephone Encounter (Signed)
Attempted to call for surgery scheduling. LVM

## 2023-11-30 ENCOUNTER — Telehealth: Payer: Self-pay

## 2023-11-30 NOTE — Telephone Encounter (Signed)
Attempted to reach pt to schedule his surgery. Left VM for him to return our call.

## 2023-12-02 ENCOUNTER — Other Ambulatory Visit: Payer: Self-pay

## 2023-12-02 DIAGNOSIS — N186 End stage renal disease: Secondary | ICD-10-CM

## 2023-12-03 ENCOUNTER — Encounter (HOSPITAL_COMMUNITY): Payer: Self-pay

## 2023-12-13 ENCOUNTER — Encounter (HOSPITAL_COMMUNITY): Payer: Self-pay

## 2023-12-13 ENCOUNTER — Institutional Professional Consult (permissible substitution): Payer: BC Managed Care – PPO | Admitting: Neurology

## 2023-12-16 ENCOUNTER — Other Ambulatory Visit (HOSPITAL_COMMUNITY): Payer: Self-pay | Admitting: Diagnostic Radiology

## 2023-12-16 ENCOUNTER — Other Ambulatory Visit: Payer: Self-pay | Admitting: Urology

## 2023-12-16 DIAGNOSIS — R339 Retention of urine, unspecified: Secondary | ICD-10-CM

## 2023-12-20 ENCOUNTER — Encounter (HOSPITAL_BASED_OUTPATIENT_CLINIC_OR_DEPARTMENT_OTHER): Payer: 59 | Attending: Internal Medicine | Admitting: Internal Medicine

## 2023-12-20 DIAGNOSIS — E11622 Type 2 diabetes mellitus with other skin ulcer: Secondary | ICD-10-CM

## 2023-12-20 DIAGNOSIS — I480 Paroxysmal atrial fibrillation: Secondary | ICD-10-CM | POA: Diagnosis not present

## 2023-12-20 DIAGNOSIS — N186 End stage renal disease: Secondary | ICD-10-CM

## 2023-12-20 DIAGNOSIS — L89324 Pressure ulcer of left buttock, stage 4: Secondary | ICD-10-CM | POA: Diagnosis not present

## 2023-12-20 DIAGNOSIS — E1122 Type 2 diabetes mellitus with diabetic chronic kidney disease: Secondary | ICD-10-CM | POA: Insufficient documentation

## 2023-12-20 DIAGNOSIS — L89154 Pressure ulcer of sacral region, stage 4: Secondary | ICD-10-CM | POA: Diagnosis not present

## 2023-12-23 ENCOUNTER — Other Ambulatory Visit: Payer: Self-pay

## 2023-12-23 ENCOUNTER — Encounter (HOSPITAL_COMMUNITY): Payer: Self-pay | Admitting: Vascular Surgery

## 2023-12-23 NOTE — Progress Notes (Signed)
PCP - Dr. Maryjean Ka Cardiologist - Pt saw Dr. Rosemary Holms in 2022  PPM/ICD - denies   Chest x-ray - 08/02/23 EKG - 11/01/23 Stress Test - denies ECHO - 07/04/21 Cardiac Cath - denies  CPAP - denies  Fasting Blood Sugar - 90's Checks Blood Sugar once/day  Blood Thinner Instructions: n/a Aspirin Instructions: continue  ERAS Protcol - no, NPO  COVID TEST- n/a  Anesthesia review: yes  Patient's wife verbally denies pt has any shortness of breath, fever, cough and chest pain during phone call     Questions were answered for pt's wife. [name] verbalized understanding of instructions.   Pt doesn't have skull flap yet, per wife. She said he will need a helmet when transferring. She said pt is bed-ridden. He has a G-tube that is not being used. He has a wound vac to a sacral wound. Pt is on Augmentin (last dose will be 2/14) for what his wife says is a "little cyst on his right leg." She said it is decreasing in size and has not drained today. The wound was draining yesterday, but less than before per wife. Dr. Darcella Cheshire office notified.

## 2023-12-23 NOTE — Progress Notes (Addendum)
Anesthesia Chart Review: SAME DAY WORK-UP  Case: [id number] Date/Time: 12/24/23 0715   Procedure: LEFT ARM ARTERIOVENOUS (AV) FISTULA CREATION VERSUS GRAFT (Left)   Anesthesia type: Choice   Pre-op diagnosis: End Stage Renal Disease   [facility] ROOM 16 / [facility]   Surgeons: Maeola Harman, MD       DISCUSSION: Patient is a 53 year old male scheduled for the above procedure.  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  Prolonged admission to [facility] from 04/30/2023 - 07/01/2023 for right frontoparietal ICH, s/p right decompressive craniectomy and evacuation of hematoma on 04/30/2023.  He underwent right IJ PermCath on 05/03/2023 for dialysis due to progression from CKD5 to ESRD.  He underwent tracheostomy and gastrostomy tube insertion 05/14/2023.  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  This was discontinued on 06/02/2023 due to positive CSF cultures for Staph epidermidis. EEG with epileptiform spikes resolved on Keppra. He developed a sacral decubitus and underwent debridement on 06/04/2023 and 06/07/2023.  He developed right cranial wound infection with dehiscence and underwent I&D on 06/07/2023. By notes, the bone flap was not replaced. Hospital course also complicated by C. difficile diarrhea, s/p vancomycin. He was discharged to [facility] and was there from 07/01/2023 - 08/10/2023. Janina Mayo was decannulated 07/23/2023. He is no longer on Eliquis for PAF  history.   [place] admission 09/17/2023 - 10/03/2023 for sepsis secondary to Pseudomonas UTI as well as sacral decubitus ulcer with wound VAC dysfunction. Antibiotics per ID. Wound Care and general surgery wound debridement and management of wound VAC. Diarrhea improved with weaning tube feeds. Nephrology managed HD. Reported G-tube now only being flushed for patency and is not being used for nutrition.   He is prescribed Augmentin on 12/13/2023 for a right leg cyst that had been draining. HHRN has also had him use betadine and manuka honey on the site. He is due to be off antibiotics by 12/24/2023. Wife says area has improved significantly but did have small amount of drainage on 12/22/2023 but did not note any on 12/23/2023. Case is posted for AVF versus AVGG, so [facility] RN notified and  communicated to Dr. Randie Heinz who is reportedly recommending to postpone surgery.  I also let [facility] RN know he still receiving wound care with [facility] for his sacral decubitus. Wife says this has been gradually improving as well (present since ~ July 2024).    VS:  Wt Readings from Last 3 Encounters:  11/02/23 105.2 kg  10/18/23 108.9 kg  10/03/23 85.7 kg   BP Readings from Last 3 Encounters:  11/17/23 110/76  11/03/23 (!) 143/83  10/18/23 138/89   Pulse Readings from Last 3 Encounters:  11/17/23 85  11/03/23 76  10/18/23 80     PROVIDERS: Street, Stephanie Coup, MD is PCP  - He had previously evaluation by cardiologist Truett Mainland, MD on 07/31/2021 for PAF and hospital follow-up. Lisbeth Renshaw, MD is neurosurgeon (for local follow right craniectomy)   LABS: For day of surgery.   OTHER: EEG 09/27/2023: IMPRESSION: This study is suggestive of cortical dysfunction arising from right centro-parietal  region  consistent with underlying craniotomy. Additionally there is cortical dysfunction in right hemisphere likely secondary to underlying stroke. No seizures were seen throughout the recording.     IMAGES: CT Abd/pelvis 11/01/2023: IMPRESSION: - Sacral decubitus ulcer is significantly smaller compared to prior exam, with stable destructive changes involving sacrum and coccyx consistent with chronic osteomyelitis. - Gastrostomy tube is in grossly good position. - Large amount of stool seen in rectum concerning for impaction. - Minimal left lower lobe airspace opacity is noted suggesting possible inflammation or atelectasis. - Aortic Atherosclerosis (ICD10-I70.0).  CT Head 11/01/2023:      IMPRESSION: No acute finding.  No hemorrhage or new infarct.   EKG: 11/02/2023: Sinus rhythm Borderline T wave abnormalities Borderline prolonged QT interval No significant change was found Confirmed by Glynn Octave [phone]) on 11/01/2023 7:23:27 AM   CV: Echo 07/04/2021: IMPRESSIONS   1. Left ventricular ejection fraction, by estimation, is 55 to 60%. The  left ventricle has normal function. The left ventricle has no regional  wall motion abnormalities. There is mild left ventricular hypertrophy.  Left ventricular diastolic parameters  were normal.   2. Right ventricular systolic function is normal. The right ventricular  size is normal. Tricuspid regurgitation signal is inadequate for assessing  PA pressure.   3. The mitral valve is normal in structure. Mild mitral valve  regurgitation. No evidence of mitral stenosis.   4. The aortic valve is grossly normal. Aortic valve regurgitation is  trivial. No aortic stenosis is present.   5. The inferior vena cava is normal in size with greater than 50%  respiratory variability, suggesting right atrial pressure of 3 mmHg.   US Carotid 05/20/2021: Summary:  - Right Carotid: Velocities in the right ICA are consistent with a 40-59% stenosis.  - Left Carotid: Velocities in the left ICA are consistent with a 1-39% stenosis.  - Vertebrals: Bilateral vertebral arteries demonstrate antegrade flow.    Past Medical History:  Diagnosis  Date   A-fib (HCC) 07/03/2021   Anemia    low iron   Anxiety    Chronic kidney disease    prorgression to ESRD 05/03/2023   COVID    has had it 2 times, one mild and one wasn't   Depression    DM2 (diabetes mellitus, type 2) (HCC)    Family history of adverse reaction to anesthesia    Dad has a "hard time waking up" after anesthesia   GERD (gastroesophageal reflux disease)    Hemorrhagic stroke (HCC) 04/30/2023   s/p right decompressive craniectomy and evacuation of hematoma on 04/30/2023   History of blood transfusion    HTN (hypertension)    Osteomyelitis of fifth toe of left foot (HCC) 08/13/2021   Osteomyelitis of fourth toe of left foot (HCC) 08/13/2021   Pneumonia    Sacral decubitus ulcer 05/2023   Stroke (HCC) 05/19/2021   unable to move left side    Past Surgical History:  Procedure Laterality Date   AMPUTATION Left 08/29/2021   Procedure: AMPUTATION OF FOURTH TOE AND RAY ALONG WITH REMAINING FITH METATARSAL;  Surgeon: Tarry Kos, MD;  [facility];  Service: Orthopedics;  Laterality: Left;   AMPUTATION Left 09/03/2021   Procedure: LISFRANC AMPUTATION;  Surgeon: Tarry Kos, MD;  [facility];  Service: Orthopedics;  Laterality: Left;   AMPUTATION Left 10/29/2021   Procedure: AMPUTATION BELOW KNEE -LEFT;  Surgeon: Tarry Kos, MD;  [facility];  Service: Orthopedics;  Laterality: Left;   APPLICATION  OF WOUND VAC Left 07/07/2021   Procedure: APPLICATION OF WOUND VAC;  Surgeon: Tarry Kos, MD;  [facility];  Service: Orthopedics;  Laterality: Left;   APPLICATION OF WOUND VAC Left 08/29/2021   Procedure: APPLICATION OF WOUND VAC;  Surgeon: Tarry Kos, MD;  [facility];  Service: Orthopedics;  Laterality: Left;   I & D EXTREMITY Left 07/03/2021   Procedure: IRRIGATION AND DEBRIDEMENT ,FIFTH RAY  AMPUTATION LEFT FOOT, , WOUND VAC PLACEMENT;  Surgeon: Tarry Kos, MD;  [facility];  Service: Orthopedics;  Laterality: Left;   I & D  EXTREMITY Left 07/07/2021   Procedure: IRRIGATION AND DEBRIDEMENT LEFT FOOT;  Surgeon: Tarry Kos, MD;  [facility];  Service: Orthopedics;  Laterality: Left;   I & D EXTREMITY Left 08/29/2021   Procedure: IRRIGATION AND DEBRIDEMENT LEFT FOOT;  Surgeon: Tarry Kos, MD;  [facility];  Service: Orthopedics;  Laterality: Left;   IR FLUORO GUIDE CV LINE RIGHT  07/09/2021   IR REMOVAL TUN CV CATH W/O FL  10/08/2021   IR REMOVAL TUN CV CATH W/O FL  07/13/2023   IR REPLACE G-TUBE SIMPLE WO FLUORO  09/20/2023   IR US GUIDE VASC ACCESS RIGHT  07/09/2021   TRACHEOSTOMY     05/14/2023 - 07/23/2023   VITRECTOMY Left    [place] eye    MEDICATIONS: No current facility-administered medications for this encounter.    amiodarone (PACERONE) 100 MG tablet   amLODipine (NORVASC) 10 MG tablet   amoxicillin-clavulanate (AUGMENTIN) 875-125 MG tablet   ascorbic acid (VITAMIN C) 500 MG tablet   ASHWAGANDHA PO   aspirin EC 81 MG tablet   atorvastatin (LIPITOR) 40 MG tablet   carvedilol (COREG) 25 MG tablet   cloNIDine (CATAPRES) 0.1 MG tablet   diphenhydramine-acetaminophen (TYLENOL PM) 25-500 MG TABS tablet   finasteride (PROSCAR) 5 MG tablet   furosemide (LASIX) 40 MG tablet   glipiZIDE (GLUCOTROL) 5 MG tablet   hydrALAZINE (APRESOLINE) 25 MG tablet   LANTUS SOLOSTAR 100 UNIT/ML Solostar Pen   levETIRAcetam (KEPPRA) 750 MG tablet   losartan (COZAAR) 50 MG tablet   melatonin 5 MG TABS   methocarbamol (ROBAXIN) 500 MG tablet   multivitamin (RENA-VIT) TABS tablet   ondansetron (ZOFRAN-ODT) 4 MG disintegrating tablet   pantoprazole (PROTONIX) 40 MG tablet   PARoxetine (PAXIL) 10 MG tablet   polyethylene glycol (MIRALAX / GLYCOLAX) 17 g packet   promethazine (PHENERGAN) 12.5 MG tablet   senna-docusate (SENOKOT-S) 8.6-50 MG tablet   terazosin (HYTRIN) 1 MG capsule   Vitamin D, Ergocalciferol, (DRISDOL) 1.25 MG (50000 UNIT) CAPS capsule    Shonna Chock, PA-C Surgical Short  Stay/Anesthesiology [facility] Phone [phone] [facility] Phone [phone] 12/23/2023 11:46 AM

## 2023-12-23 NOTE — Anesthesia Preprocedure Evaluation (Signed)
Anesthesia Evaluation    Airway        Dental   Pulmonary           Cardiovascular hypertension,      Neuro/Psych    GI/Hepatic   Endo/Other  diabetes    Renal/GU      Musculoskeletal   Abdominal   Peds  Hematology   Anesthesia Other Findings   Reproductive/Obstetrics                             Anesthesia Physical Anesthesia Plan  ASA:   Anesthesia Plan:    Post-op Pain Management:    Induction:   PONV Risk Score and Plan:   Airway Management Planned:   Additional Equipment:   Intra-op Plan:   Post-operative Plan:   Informed Consent:   Plan Discussed with:   Anesthesia Plan Comments: (PAT note written 12/23/2023 by Shonna Chock, PA-C.  )       Anesthesia Quick Evaluation

## 2023-12-24 ENCOUNTER — Encounter (HOSPITAL_COMMUNITY): Admission: RE | Payer: Self-pay | Source: Home / Self Care

## 2023-12-24 ENCOUNTER — Encounter (HOSPITAL_COMMUNITY): Payer: Self-pay | Admitting: Vascular Surgery

## 2023-12-24 ENCOUNTER — Ambulatory Visit (HOSPITAL_COMMUNITY): Admission: RE | Admit: 2023-12-24 | Payer: 59 | Source: Home / Self Care | Admitting: Vascular Surgery

## 2023-12-24 HISTORY — DX: Family history of other specified conditions: Z84.89

## 2023-12-24 HISTORY — DX: Gastro-esophageal reflux disease without esophagitis: K21.9

## 2023-12-24 HISTORY — DX: Depression, unspecified: F32.A

## 2023-12-24 HISTORY — DX: Anxiety disorder, unspecified: F41.9

## 2023-12-24 SURGERY — ARTERIOVENOUS (AV) FISTULA CREATION
Anesthesia: Choice | Laterality: Left

## 2023-12-28 ENCOUNTER — Encounter (HOSPITAL_COMMUNITY): Payer: Self-pay | Admitting: Nephrology

## 2023-12-28 ENCOUNTER — Ambulatory Visit (HOSPITAL_COMMUNITY)
Admission: RE | Admit: 2023-12-28 | Discharge: 2023-12-28 | Disposition: A | Discharge status: Home / Self Care | Payer: 59 | Attending: Nephrology | Admitting: Nephrology

## 2023-12-28 ENCOUNTER — Encounter (HOSPITAL_COMMUNITY)
Admission: RE | Disposition: A | Discharge status: Home / Self Care | Payer: Self-pay | Source: Home / Self Care | Attending: Nephrology

## 2023-12-28 DIAGNOSIS — I480 Paroxysmal atrial fibrillation: Secondary | ICD-10-CM | POA: Diagnosis not present

## 2023-12-28 DIAGNOSIS — Z79899 Other long term (current) drug therapy: Secondary | ICD-10-CM | POA: Diagnosis not present

## 2023-12-28 DIAGNOSIS — Z7984 Long term (current) use of oral hypoglycemic drugs: Secondary | ICD-10-CM | POA: Insufficient documentation

## 2023-12-28 DIAGNOSIS — T85611A Breakdown (mechanical) of intraperitoneal dialysis catheter, initial encounter: Secondary | ICD-10-CM | POA: Insufficient documentation

## 2023-12-28 DIAGNOSIS — Z794 Long term (current) use of insulin: Secondary | ICD-10-CM | POA: Insufficient documentation

## 2023-12-28 DIAGNOSIS — Z8673 Personal history of transient ischemic attack (TIA), and cerebral infarction without residual deficits: Secondary | ICD-10-CM | POA: Diagnosis not present

## 2023-12-28 DIAGNOSIS — T8249XA Other complication of vascular dialysis catheter, initial encounter: Secondary | ICD-10-CM | POA: Diagnosis present

## 2023-12-28 DIAGNOSIS — Z992 Dependence on renal dialysis: Secondary | ICD-10-CM | POA: Insufficient documentation

## 2023-12-28 DIAGNOSIS — D631 Anemia in chronic kidney disease: Secondary | ICD-10-CM | POA: Diagnosis not present

## 2023-12-28 DIAGNOSIS — Y712 Prosthetic and other implants, materials and accessory cardiovascular devices associated with adverse incidents: Secondary | ICD-10-CM | POA: Insufficient documentation

## 2023-12-28 DIAGNOSIS — Z8249 Family history of ischemic heart disease and other diseases of the circulatory system: Secondary | ICD-10-CM | POA: Insufficient documentation

## 2023-12-28 DIAGNOSIS — E11649 Type 2 diabetes mellitus with hypoglycemia without coma: Secondary | ICD-10-CM | POA: Diagnosis not present

## 2023-12-28 DIAGNOSIS — Y828 Other medical devices associated with adverse incidents: Secondary | ICD-10-CM | POA: Diagnosis not present

## 2023-12-28 DIAGNOSIS — E1122 Type 2 diabetes mellitus with diabetic chronic kidney disease: Secondary | ICD-10-CM | POA: Insufficient documentation

## 2023-12-28 DIAGNOSIS — Z89512 Acquired absence of left leg below knee: Secondary | ICD-10-CM | POA: Insufficient documentation

## 2023-12-28 DIAGNOSIS — I120 Hypertensive chronic kidney disease with stage 5 chronic kidney disease or end stage renal disease: Secondary | ICD-10-CM | POA: Insufficient documentation

## 2023-12-28 DIAGNOSIS — L89899 Pressure ulcer of other site, unspecified stage: Secondary | ICD-10-CM | POA: Diagnosis not present

## 2023-12-28 DIAGNOSIS — N186 End stage renal disease: Secondary | ICD-10-CM | POA: Insufficient documentation

## 2023-12-28 DIAGNOSIS — Z7982 Long term (current) use of aspirin: Secondary | ICD-10-CM | POA: Diagnosis not present

## 2023-12-28 DIAGNOSIS — Z8616 Personal history of COVID-19: Secondary | ICD-10-CM | POA: Diagnosis not present

## 2023-12-28 HISTORY — PX: DIALYSIS/PERMA CATHETER REMOVAL: CATH118289

## 2023-12-28 HISTORY — PX: DIALYSIS/PERMA CATHETER INSERTION: CATH118288

## 2023-12-28 LAB — GLUCOSE, CAPILLARY
Glucose-Capillary: 15 mg/dL — CL (ref 70–99)
Glucose-Capillary: 191 mg/dL — ABNORMAL HIGH (ref 70–99)
Glucose-Capillary: 52 mg/dL — ABNORMAL LOW (ref 70–99)

## 2023-12-28 SURGERY — DIALYSIS/PERMA CATHETER REMOVAL

## 2023-12-28 MED ORDER — HEPARIN (PORCINE) IN NACL 1000-0.9 UT/500ML-% IV SOLN
INTRAVENOUS | Status: DC | PRN
  Administered 2023-12-28: 500 mL

## 2023-12-28 MED ORDER — LIDOCAINE HCL (PF) 1 % IJ SOLN
INTRAMUSCULAR | Status: DC | PRN
  Administered 2023-12-28: 10 mL

## 2023-12-28 MED ORDER — DEXTROSE 50 % IV SOLN
INTRAVENOUS | Status: AC
  Administered 2023-12-28: 25 g via INTRAMUSCULAR
  Filled 2023-12-28: qty 50

## 2023-12-28 MED ORDER — HEPARIN SODIUM (PORCINE) 1000 UNIT/ML IJ SOLN
INTRAMUSCULAR | Status: DC | PRN
  Administered 2023-12-28: 4000 [IU] via INTRAVENOUS

## 2023-12-28 MED ORDER — HEPARIN SODIUM (PORCINE) 1000 UNIT/ML IJ SOLN
INTRAMUSCULAR | Status: AC
  Filled 2023-12-28: qty 10

## 2023-12-28 MED ORDER — LIDOCAINE HCL (PF) 1 % IJ SOLN
INTRAMUSCULAR | Status: AC
  Filled 2023-12-28: qty 30

## 2023-12-28 SURGICAL SUPPLY — 6 items
BAG SNAP BAND KOVER 36X36 (MISCELLANEOUS) IMPLANT
CATH PALINDROME-P 23 W/VT (CATHETERS) IMPLANT
COVER DOME SNAP 22 D (MISCELLANEOUS) IMPLANT
GUIDEWIRE ZIPWIRE 035/150 ANGL (WIRE) IMPLANT
KIT PV (KITS) ×1 IMPLANT
MAT PREVALON FULL STRYKER (MISCELLANEOUS) IMPLANT

## 2023-12-28 NOTE — Op Note (Signed)
Patient presents with poor flows in his right IJ tunneled hemodialysis catheter (19 cm cuff to tip- Palindrome) that was placed 5 months ago. On examination, aspiration from both ports is good however flushing from the arterial port is sluggish. Chest x-ray confirms the catheter tip is a little higher positioned in the SVC. The catheter cuff is right at the exit site.    Summary:  1) The patient had a successful 23 cm CTT Palindrome hemodialysis catheter exchange in the right internal jugular vein. 2) No sheath noted through either port. 3) Okay to use catheter immediately.  Description of procedure: The right neck, chest and the catheter were prepped and draped in the usual sterile fashion. The exit site and adjacent tunnel tract were anesthetized with lidocaine 1% with epinephrine. The cuff was dissected free with a curved Kelly and manual traction. The catheter was withdrawn and venogram through each of the ports was performed; there was no fibrin sheath evident through either port.   A hydrophilic wire was manipulated and advanced through the arterial port and the tip was parked in the IVC. The catheter was completely removed and noted to be entirely intact. A new 23 cm cuff to tip Palindrome catheter was inserted over the guidewire and the tip parked in the junction of the IVC/right atrium and at that point the cuff was approximately 1 cm deep to the exit site.   Aspiration and flushing of both limbs of the catheter confirmed excellent flow. No kinks were visible on fluoroscopic imaging. Both limbs of the catheter were locked with heparin and sterile caps were placed.   The hub was secured on to the chest wall with 2-0 nylon wing sutures.  Sterile dressings were placed, and the patient returned to recovery in stable condition.  Sedation: none Sedation time: n/a  Contrast: 3 mL  Monitoring: Because of the patient's comorbid conditions and sedation during the procedure, continuous EKG  monitoring and O2 saturation monitoring was performed throughout the procedure by the RN. There were no abnormal arrhythmias encountered.  Complications: None.  Diagnoses:   T82.49XA Other complication of vascular dialysis catheter (Poor flows) N18.6 End stage renal disease  Z99.2 Dialysis dependence  Procedures Coding:  36581 Tunneled catheter exchange 77001  Fluoroscopy guidance for catheter exchange. Z6109 Contrast  Recommendations: Remove the suture in 3 weeks. 2.   Report any blood flow problems to [facility].  Discharge: The patient was discharged home in stable condition. The patient was given education regarding the care of the catheter and specific instructions in case of any problems.

## 2023-12-28 NOTE — Progress Notes (Signed)
Upon arrival to the unit the patient was unresponsive by voice but able to squeeze his wife's hand. Checked CBG and level was 15. Gave one amp of dextrose 50 per Dr Eliane Decree order. Recheck CBG after 5 minutes and level was 191. Patient is responsive and answering questions appropriately. Wife states he is back to baseline.

## 2023-12-28 NOTE — Discharge Instructions (Addendum)
OK to discharge home any time after 2PM.  General care instructions: - Do not drive or operate heavy machinery for 24hrs - Avoid making any important decisions for the remainder of the day. - You should be able to eat, drink, and resume your normal medications. - Avoid any strenuous activity for the remainder of the day. Potential complications: - You are bleeding at the exit or venotomy site and it will not stop with direct pressure; if there is a slow ooze apply pressure over the venotomy site neck region where the catheter can be felt for 5 minutes.  - You have a fever, swelling, see redness or feel heat over the tunnel or exit site. 3.  Medication instructions: - Continue routine medications unless otherwise instructed. 4. Please have your sutures removed at the hub site in 3 weeks at dialysis.

## 2023-12-28 NOTE — H&P (Signed)
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  He has a right IJ hemodialysis catheter that was placed back in October 2024 to initiate dialysis and he has had problems with poor function/limitations of flow necessitating exchange.  With the patient was seen in the preop area, he was encephalopathic and his wife reports decreased responsiveness since he completed his physical therapy this morning and en route to the hospital.  A blood glucose level was checked and found to be 15 mg/dL following which he had 50 cc of 50% dextrose administered via his dialysis catheter.  Repeat blood glucose was up to 191 mg/dL and he was more awake and responsive-closer to his baseline per his wife.  He had received Lantus last night and glipizide this morning.  I educated his wife to hold glipizide on mornings when he would be n.p.o. in the future.  The procedure was described to his wife for catheter exchange under local anesthesia and she consents to proceed.  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   COVID    has had it 2 times, one mild and one wasn't   Depression    DM2 (diabetes mellitus, type 2) (HCC)    Family history of adverse reaction to anesthesia    Dad has a "hard time waking up" after anesthesia   GERD (gastroesophageal reflux disease)    Hemorrhagic stroke (HCC) 04/30/2023   s/p right decompressive craniectomy and evacuation of hematoma on 04/30/2023   History of blood transfusion    HTN (hypertension)    Osteomyelitis of fifth toe of left foot (HCC)  08/13/2021   Osteomyelitis of fourth toe of left foot (HCC) 08/13/2021   Pneumonia    Sacral decubitus ulcer 05/2023   Stroke (HCC) 05/19/2021   unable to move left side    Past Surgical History:  Procedure Laterality Date   AMPUTATION Left 08/29/2021   Procedure: AMPUTATION OF FOURTH TOE AND RAY ALONG WITH REMAINING FITH METATARSAL;  Surgeon: Tarry Kos, MD;  Location: [facility];  Service: Orthopedics;  Laterality: Left;   AMPUTATION Left 09/03/2021   Procedure: LISFRANC AMPUTATION;  Surgeon: Tarry Kos, MD;  Location: [facility];  Service: Orthopedics;  Laterality: Left;   AMPUTATION Left 10/29/2021   Procedure: AMPUTATION BELOW KNEE -LEFT;  Surgeon: Tarry Kos, MD;  Location: [facility];  Service: Orthopedics;  Laterality: Left;   APPLICATION OF WOUND VAC Left 07/07/2021   Procedure: APPLICATION OF WOUND VAC;  Surgeon: Tarry Kos, MD;  Location: [facility];  Service: Orthopedics;  Laterality: Left;   APPLICATION OF WOUND VAC Left 08/29/2021   Procedure: APPLICATION OF WOUND VAC;  Surgeon: Tarry Kos, MD;  Location: [facility];  Service: Orthopedics;  Laterality: Left;   I & D EXTREMITY Left 07/03/2021   Procedure: IRRIGATION AND DEBRIDEMENT ,FIFTH RAY  AMPUTATION LEFT FOOT, , WOUND VAC PLACEMENT;  Surgeon: Tarry Kos, MD;  Location: [facility];  Service: Orthopedics;  Laterality: Left;   I & D EXTREMITY Left 07/07/2021   Procedure: IRRIGATION AND DEBRIDEMENT LEFT FOOT;  Surgeon: Tarry Kos, MD;  Location: [facility];  Service: Orthopedics;  Laterality: Left;   I & D EXTREMITY Left 08/29/2021   Procedure: IRRIGATION AND DEBRIDEMENT LEFT FOOT;  Surgeon: Tarry Kos, MD;  Location: [facility];  Service: Orthopedics;  Laterality: Left;   IR FLUORO GUIDE CV LINE RIGHT  07/09/2021   IR REMOVAL TUN CV CATH W/O FL  10/08/2021   IR REMOVAL TUN CV CATH W/O FL  07/13/2023   IR REPLACE G-TUBE SIMPLE WO FLUORO  09/20/2023   IR US GUIDE VASC ACCESS RIGHT  07/09/2021   TRACHEOSTOMY     05/14/2023 - 07/23/2023    VITRECTOMY Left    [place]    Family History  Problem Relation Age of Onset   Stroke Mother    Cancer Mother    Heart disease Father    Social History:  reports that he has never smoked. He has never used smokeless tobacco. He reports that he does not currently use alcohol. He reports that he does not use drugs.  Allergies: No Known Allergies  Medications Prior to Admission  Medication Sig Dispense Refill   amiodarone (PACERONE) 100 MG tablet Take 100 mg by mouth daily.     amLODipine (NORVASC) 10 MG tablet Take 10 mg by mouth daily.     ascorbic acid (VITAMIN C) 500 MG tablet Take 1 tablet (500 mg total) by mouth 2 (two) times daily. 60 tablet 0   ASHWAGANDHA PO Take 2 tablets by mouth at bedtime.     aspirin EC 81 MG tablet Take 81 mg by mouth at bedtime. Swallow whole.     atorvastatin (LIPITOR) 40 MG tablet Take 40 mg by mouth at bedtime.     carvedilol (COREG) 25 MG tablet Take 1 tablet (25 mg total) by mouth 2 (two) times daily with a meal. 60 tablet 0   cloNIDine (CATAPRES) 0.1 MG tablet Take 1 tablet (0.1 mg total) by mouth daily. 30 tablet 0   diphenhydramine-acetaminophen (TYLENOL PM) 25-500 MG TABS tablet Take 2 tablets by mouth at bedtime as needed.     finasteride (PROSCAR) 5 MG tablet Take 5 mg by mouth daily.     furosemide (LASIX) 40 MG tablet Take 40 mg by mouth daily.     hydrALAZINE (APRESOLINE) 25 MG tablet Take 25 mg by mouth 3 (three) times daily.     LANTUS SOLOSTAR 100 UNIT/ML Solostar Pen Inject 5 Units into the skin daily. 15 mL 0   levETIRAcetam (KEPPRA) 750 MG tablet Take 1,500 mg by mouth 2 (two) times daily.     losartan (COZAAR) 50 MG tablet Take 1 tablet (50 mg total) by mouth daily. 30 tablet 0   melatonin 5 MG TABS Take 5-10 mg by mouth at bedtime as needed (sleep).     methocarbamol (ROBAXIN) 500 MG tablet Take 1 tablet (500 mg total) by mouth every 8 (eight) hours as needed for muscle spasms. (Patient taking differently: Take 500 mg by mouth 2  (two) times daily. May take a third 500 mg dose midday as needed for muscle spasms) 30 tablet 0   multivitamin (RENA-VIT) TABS tablet Take 1 tablet by mouth at bedtime. 30 tablet 0   ondansetron (ZOFRAN-ODT) 4 MG disintegrating tablet Take 4 mg by mouth every 8 (eight) hours as needed for nausea or vomiting.     pantoprazole (PROTONIX) 40 MG tablet Take 1 tablet (40 mg total) by mouth daily. 30 tablet 0   PARoxetine (PAXIL) 10 MG tablet Take 10 mg by mouth  daily.     polyethylene glycol (MIRALAX / GLYCOLAX) 17 g packet Take 17 g by mouth 2 (two) times daily. (Patient taking differently: Take 17 g by mouth daily as needed for moderate constipation.) 60 each 0   promethazine (PHENERGAN) 12.5 MG tablet Take 1 tablet (12.5 mg total) by mouth every 6 (six) hours as needed for nausea or vomiting. 30 tablet 0   senna-docusate (SENOKOT-S) 8.6-50 MG tablet Take 2 tablets by mouth 2 (two) times daily. (Patient taking differently: Take 2 tablets by mouth daily as needed for mild constipation.) 120 tablet 0   terazosin (HYTRIN) 1 MG capsule Take 1 mg by mouth at bedtime.     Vitamin D, Ergocalciferol, (DRISDOL) 1.25 MG (50000 UNIT) CAPS capsule Take 1 capsule (50,000 Units total) by mouth every 7 (seven) days. 4 capsule 0   amoxicillin-clavulanate (AUGMENTIN) 875-125 MG tablet Take 1 tablet by mouth 2 (two) times daily.     glipiZIDE (GLUCOTROL) 5 MG tablet Take 1 tablet (5 mg total) by mouth daily. 30 tablet 0    Results for orders placed or performed during the hospital encounter of 12/28/23 (from the past 48 hours)  Glucose, capillary     Status: Abnormal   Collection Time: 12/28/23 12:23 PM  Result Value Ref Range   Glucose-Capillary 15 (LL) 70 - 99 mg/dL    Comment: Glucose reference range applies only to samples taken after fasting for at least 8 hours.  Glucose, capillary     Status: Abnormal   Collection Time: 12/28/23 12:36 PM  Result Value Ref Range   Glucose-Capillary 191 (H) 70 - 99 mg/dL     Comment: Glucose reference range applies only to samples taken after fasting for at least 8 hours.   No results found.  Review of Systems  Weight 80.3 kg. Physical Exam Vitals and nursing note reviewed.  Constitutional:      Comments: Wheelchair-bound, initially somnolent and then more awake as blood glucose improved  HENT:     Head:     Comments: Wearing protective helmet Eyes:     Extraocular Movements: Extraocular movements intact.     Conjunctiva/sclera: Conjunctivae normal.  Cardiovascular:     Rate and Rhythm: Normal rate and regular rhythm.     Pulses: Normal pulses.     Comments: Right IJ TDC Pulmonary:     Effort: Pulmonary effort is normal.     Breath sounds: Normal breath sounds.  Abdominal:     General: Bowel sounds are normal.     Palpations: Abdomen is soft.     Comments: PEG tube in place  Musculoskeletal:     Cervical back: Normal range of motion and neck supple.      Assessment/Plan 1.  Poorly functioning dialysis catheter: Procedure described to his wife for an over-the-wire exchange of his dialysis catheter and she consents to proceed.  This will be done under local anesthesia with the desire to avoid sedation with earlier encephalopathy associated with hypoglycemia. 2.  Hypoglycemia: Treated acutely with 50% dextrose administered intravenously and will be monitored carefully during the procedure for additional need to administer glucose. 3.  End-stage renal disease: Resume hemodialysis on MWF schedule following catheter exchange today. 4.  Hypertension: Blood pressure currently acceptable and will be monitored during procedure  Dagoberto Ligas, MD 12/28/2023, 1:02 PM

## 2023-12-30 ENCOUNTER — Other Ambulatory Visit: Payer: Self-pay

## 2023-12-30 DIAGNOSIS — N186 End stage renal disease: Secondary | ICD-10-CM

## 2023-12-31 ENCOUNTER — Encounter (HOSPITAL_COMMUNITY): Payer: Self-pay | Admitting: *Deleted

## 2023-12-31 ENCOUNTER — Observation Stay (HOSPITAL_COMMUNITY)
Admission: EM | Admit: 2023-12-31 | Discharge: 2024-01-01 | Disposition: A | Discharge status: Home / Self Care | Payer: 59 | Attending: Emergency Medicine | Admitting: Emergency Medicine

## 2023-12-31 ENCOUNTER — Other Ambulatory Visit (HOSPITAL_COMMUNITY): Payer: Self-pay | Admitting: Radiology

## 2023-12-31 ENCOUNTER — Other Ambulatory Visit: Payer: Self-pay

## 2023-12-31 ENCOUNTER — Other Ambulatory Visit: Payer: Self-pay | Admitting: Diagnostic Radiology

## 2023-12-31 DIAGNOSIS — T8249XA Other complication of vascular dialysis catheter, initial encounter: Principal | ICD-10-CM | POA: Insufficient documentation

## 2023-12-31 DIAGNOSIS — Y828 Other medical devices associated with adverse incidents: Secondary | ICD-10-CM | POA: Insufficient documentation

## 2023-12-31 DIAGNOSIS — Z992 Dependence on renal dialysis: Secondary | ICD-10-CM | POA: Insufficient documentation

## 2023-12-31 DIAGNOSIS — Z7984 Long term (current) use of oral hypoglycemic drugs: Secondary | ICD-10-CM | POA: Insufficient documentation

## 2023-12-31 DIAGNOSIS — D649 Anemia, unspecified: Principal | ICD-10-CM | POA: Diagnosis present

## 2023-12-31 DIAGNOSIS — D62 Acute posthemorrhagic anemia: Secondary | ICD-10-CM | POA: Diagnosis present

## 2023-12-31 DIAGNOSIS — Z8616 Personal history of COVID-19: Secondary | ICD-10-CM | POA: Insufficient documentation

## 2023-12-31 DIAGNOSIS — I639 Cerebral infarction, unspecified: Secondary | ICD-10-CM | POA: Diagnosis present

## 2023-12-31 DIAGNOSIS — L89899 Pressure ulcer of other site, unspecified stage: Secondary | ICD-10-CM | POA: Insufficient documentation

## 2023-12-31 DIAGNOSIS — E1122 Type 2 diabetes mellitus with diabetic chronic kidney disease: Secondary | ICD-10-CM | POA: Insufficient documentation

## 2023-12-31 DIAGNOSIS — N189 Chronic kidney disease, unspecified: Secondary | ICD-10-CM

## 2023-12-31 DIAGNOSIS — N186 End stage renal disease: Secondary | ICD-10-CM | POA: Insufficient documentation

## 2023-12-31 DIAGNOSIS — I480 Paroxysmal atrial fibrillation: Secondary | ICD-10-CM | POA: Insufficient documentation

## 2023-12-31 DIAGNOSIS — E119 Type 2 diabetes mellitus without complications: Secondary | ICD-10-CM

## 2023-12-31 DIAGNOSIS — I120 Hypertensive chronic kidney disease with stage 5 chronic kidney disease or end stage renal disease: Secondary | ICD-10-CM | POA: Insufficient documentation

## 2023-12-31 DIAGNOSIS — Z01818 Encounter for other preprocedural examination: Secondary | ICD-10-CM

## 2023-12-31 DIAGNOSIS — Z79899 Other long term (current) drug therapy: Secondary | ICD-10-CM | POA: Insufficient documentation

## 2023-12-31 DIAGNOSIS — E11649 Type 2 diabetes mellitus with hypoglycemia without coma: Secondary | ICD-10-CM | POA: Insufficient documentation

## 2023-12-31 DIAGNOSIS — Z8673 Personal history of transient ischemic attack (TIA), and cerebral infarction without residual deficits: Secondary | ICD-10-CM | POA: Insufficient documentation

## 2023-12-31 DIAGNOSIS — Z7982 Long term (current) use of aspirin: Secondary | ICD-10-CM | POA: Insufficient documentation

## 2023-12-31 DIAGNOSIS — D631 Anemia in chronic kidney disease: Secondary | ICD-10-CM | POA: Insufficient documentation

## 2023-12-31 LAB — COMPREHENSIVE METABOLIC PANEL
ALT: 42 U/L (ref 0–44)
AST: 31 U/L (ref 15–41)
Albumin: 1.6 g/dL — ABNORMAL LOW (ref 3.5–5.0)
Alkaline Phosphatase: 142 U/L — ABNORMAL HIGH (ref 38–126)
Anion gap: 11 (ref 5–15)
BUN: 44 mg/dL — ABNORMAL HIGH (ref 6–20)
CO2: 25 mmol/L (ref 22–32)
Calcium: 9 mg/dL (ref 8.9–10.3)
Chloride: 98 mmol/L (ref 98–111)
Creatinine, Ser: 2.1 mg/dL — ABNORMAL HIGH (ref 0.61–1.24)
GFR, Estimated: 37 mL/min — ABNORMAL LOW (ref >60)
Glucose, Bld: 117 mg/dL — ABNORMAL HIGH (ref 70–99)
Potassium: 4.4 mmol/L (ref 3.5–5.1)
Sodium: 134 mmol/L — ABNORMAL LOW (ref 135–145)
Total Bilirubin: 0.5 mg/dL (ref 0.0–1.2)
Total Protein: 6.4 g/dL — ABNORMAL LOW (ref 6.5–8.1)

## 2023-12-31 LAB — CBC
HCT: 24.2 % — ABNORMAL LOW (ref 39.0–52.0)
Hemoglobin: 7.1 g/dL — ABNORMAL LOW (ref 13.0–17.0)
MCH: 26.7 pg (ref 26.0–34.0)
MCHC: 29.3 g/dL — ABNORMAL LOW (ref 30.0–36.0)
MCV: 91 fL (ref 80.0–100.0)
Platelets: 424 10*3/uL — ABNORMAL HIGH (ref 150–400)
RBC: 2.66 MIL/uL — ABNORMAL LOW (ref 4.22–5.81)
RDW: 19.4 % — ABNORMAL HIGH (ref 11.5–15.5)
WBC: 18.7 10*3/uL — ABNORMAL HIGH (ref 4.0–10.5)
nRBC: 0 % (ref 0.0–0.2)

## 2023-12-31 LAB — SAMPLE TO BLOOD BANK

## 2023-12-31 LAB — POC OCCULT BLOOD, ED: Fecal Occult Bld: NEGATIVE

## 2023-12-31 LAB — PREPARE RBC (CROSSMATCH)

## 2023-12-31 MED ORDER — SODIUM CHLORIDE 0.9% IV SOLUTION: Freq: Once | INTRAVENOUS | Status: DC

## 2023-12-31 NOTE — ED Provider Triage Note (Signed)
Emergency Medicine Provider Triage Evaluation Note  Bruce Little , a 53 y.o. male  was evaluated in triage.  Pt complains of presented for anemia.  Patient had dialysis yesterday and had a full session but was told today that his hemoglobin is 6.8 and that he has a blood transfusion.  Patient is accompanied by caregiver and states that patient is more more weak than normal but at mental baseline and denies skin color changes, melena, blood in the stool.  Review of Systems  Positive:  Negative:   Physical Exam  BP 119/70 (BP Location: Right Arm)   Pulse 80   Resp 15   Ht 6\' 2"  (1.88 m)   Wt 80.3 kg   SpO2 100%   BMI 22.73 kg/m  Gen:   Awake, no distress, at mental baseline Resp:  Normal effort  MSK:   Moves extremities without difficulty  Other:    Medical Decision Making  Medically screening exam initiated at 6:08 PM.  Appropriate orders placed.  Bruce Little was informed that the remainder of the evaluation will be completed by another provider, this initial triage assessment does not replace that evaluation, and the importance of remaining in the ED until their evaluation is complete.  Workup initiated, patient stable at this time   [name] 12/31/23 5621

## 2023-12-31 NOTE — ED Provider Notes (Signed)
 [place] EMERGENCY DEPARTMENT AT [facility] Provider Note   CSN: [id number] Arrival date & time: 12/31/23  1704     History  Chief Complaint  Patient presents with   low hgb    Bruce Little is a 53 y.o. male.  HPI   Patient has a history of diabetes hypertension stroke chronic kidney disease paroxysmal atrial fibrillation, osteomyelitis, intracranial hemorrhage, sacral decubitus ulcer.  Patient had dialysis Wednesday and Thursday of this week because he was not able to have dialysis on Tuesday.  Patient had laboratory tests and was told that his hemoglobin was below 7 and he needed to come to the emergency room for evaluation.  History is provided by the wife as the patient is nonverbal.  He is wheelchair-bound.  She has not noticed any blood in his stool.  She has not noticed any bleeding from his decubitus ulcer  Home Medications Prior to Admission medications   Medication Sig Start Date End Date Taking? Authorizing Provider  amiodarone (PACERONE) 100 MG tablet Take 100 mg by mouth daily.    [provider]  amLODipine (NORVASC) 10 MG tablet Take 10 mg by mouth daily.    [provider]  amoxicillin-clavulanate (AUGMENTIN) 875-125 MG tablet Take 1 tablet by mouth 2 (two) times daily. 12/13/23   [provider]  ascorbic acid (VITAMIN C) 500 MG tablet Take 1 tablet (500 mg total) by mouth 2 (two) times daily. 10/02/23   [name], MD  ASHWAGANDHA PO Take 2 tablets by mouth at bedtime.    [provider]  aspirin EC 81 MG tablet Take 81 mg by mouth at bedtime. Swallow whole.    [provider]  atorvastatin (LIPITOR) 40 MG tablet Take 40 mg by mouth at bedtime.    [provider]  carvedilol (COREG) 25 MG tablet Take 1 tablet (25 mg total) by mouth 2 (two) times daily with a meal. 10/02/23   [name], MD  cloNIDine (CATAPRES) 0.1 MG tablet Take 1 tablet (0.1 mg total) by mouth daily. 10/02/23    [name], MD  diphenhydramine-acetaminophen (TYLENOL PM) 25-500 MG TABS tablet Take 2 tablets by mouth at bedtime as needed.    [provider]  finasteride (PROSCAR) 5 MG tablet Take 5 mg by mouth daily.    [provider]  furosemide (LASIX) 40 MG tablet Take 40 mg by mouth daily.    [provider]  glipiZIDE (GLUCOTROL) 5 MG tablet Take 1 tablet (5 mg total) by mouth daily. 05/21/21 12/20/23  Marguerita Merles Latif, DO  hydrALAZINE (APRESOLINE) 25 MG tablet Take 25 mg by mouth 3 (three) times daily.    [provider]  LANTUS SOLOSTAR 100 UNIT/ML Solostar Pen Inject 5 Units into the skin daily. 10/02/23   [name], MD  levETIRAcetam (KEPPRA) 750 MG tablet Take 1,500 mg by mouth 2 (two) times daily.    [provider]  losartan (COZAAR) 50 MG tablet Take 1 tablet (50 mg total) by mouth daily. 10/02/23   [name], MD  melatonin 5 MG TABS Take 5-10 mg by mouth at bedtime as needed (sleep).    [provider]  methocarbamol (ROBAXIN) 500 MG tablet Take 1 tablet (500 mg total) by mouth every 8 (eight) hours as needed for muscle spasms. Patient taking differently: Take 500 mg by mouth 2 (two) times daily. May take a third 500 mg dose midday as needed for muscle spasms 10/02/23  [name], MD  multivitamin (RENA-VIT) TABS tablet Take 1 tablet by mouth at bedtime. 10/02/23   [name], MD  ondansetron (ZOFRAN-ODT) 4 MG disintegrating tablet Take 4 mg by mouth every 8 (eight) hours as needed for nausea or vomiting.    [provider]  pantoprazole (PROTONIX) 40 MG tablet Take 1 tablet (40 mg total) by mouth daily. 05/22/21   Marguerita Merles Latif, DO  PARoxetine (PAXIL) 10 MG tablet Take 10 mg by mouth daily.    [provider]  polyethylene glycol (MIRALAX / GLYCOLAX) 17 g packet Take 17 g by mouth 2 (two) times daily. Patient taking differently: Take 17 g by mouth daily as needed  for moderate constipation. 11/03/23   Osvaldo Shipper, MD  promethazine (PHENERGAN) 12.5 MG tablet Take 1 tablet (12.5 mg total) by mouth every 6 (six) hours as needed for nausea or vomiting. 11/03/23   Osvaldo Shipper, MD  senna-docusate (SENOKOT-S) 8.6-50 MG tablet Take 2 tablets by mouth 2 (two) times daily. Patient taking differently: Take 2 tablets by mouth daily as needed for mild constipation. 11/03/23   Osvaldo Shipper, MD  terazosin (HYTRIN) 1 MG capsule Take 1 mg by mouth at bedtime.    [provider]  Vitamin D, Ergocalciferol, (DRISDOL) 1.25 MG (50000 UNIT) CAPS capsule Take 1 capsule (50,000 Units total) by mouth every 7 (seven) days. 10/03/23   [name], MD      Allergies    Patient has no known allergies.    Review of Systems   Review of Systems  Physical Exam Updated Vital Signs BP 123/74 (BP Location: Left Arm)   Pulse 88   Resp 18   Ht 1.88 m (6\' 2" )   Wt 80.3 kg   SpO2 100%   BMI 22.73 kg/m  Physical Exam Vitals and nursing note reviewed.  Constitutional:      Appearance: He is well-developed.  HENT:     Head: Normocephalic and atraumatic.     Right Ear: External ear normal.     Left Ear: External ear normal.  Eyes:     General: No scleral icterus.       Right eye: No discharge.        Left eye: No discharge.     Conjunctiva/sclera: Conjunctivae normal.  Neck:     Trachea: No tracheal deviation.  Cardiovascular:     Rate and Rhythm: Normal rate and regular rhythm.  Pulmonary:     Effort: Pulmonary effort is normal. No respiratory distress.     Breath sounds: Normal breath sounds. No stridor. No wheezing or rales.  Abdominal:     General: Bowel sounds are normal. There is no distension.     Palpations: Abdomen is soft.     Tenderness: There is no abdominal tenderness. There is no guarding or rebound.  Genitourinary:    Comments: No gross blood noted on rectal exam, Hemoccult sent to the lab Musculoskeletal:        General: No  tenderness or deformity.     Cervical back: Neck supple.     Comments: Decubitus ulcer sacrum, no surrounding erythema or bleeding  Skin:    General: Skin is warm and dry.     Findings: No rash.     Comments: Status post amputation left lower extremity  Neurological:     General: No focal deficit present.     Mental Status: He is alert.     Cranial Nerves: No cranial nerve deficit, dysarthria  or facial asymmetry.     Sensory: No sensory deficit.     Motor: No abnormal muscle tone or seizure activity.     Coordination: Coordination normal.  Psychiatric:        Mood and Affect: Mood normal.     ED Results / Procedures / Treatments   Labs (all labs ordered are listed, but only abnormal results are displayed) Labs Reviewed  COMPREHENSIVE METABOLIC PANEL - Abnormal; Notable for the following components:      Result Value   Sodium 134 (*)    Glucose, Bld 117 (*)    BUN 44 (*)    Creatinine, Ser 2.10 (*)    Total Protein 6.4 (*)    Albumin 1.6 (*)    Alkaline Phosphatase 142 (*)    GFR, Estimated 37 (*)    All other components within normal limits  CBC - Abnormal; Notable for the following components:   WBC 18.7 (*)    RBC 2.66 (*)    Hemoglobin 7.1 (*)    HCT 24.2 (*)    MCHC 29.3 (*)    RDW 19.4 (*)    Platelets 424 (*)    All other components within normal limits  SAMPLE TO BLOOD BANK    EKG None  Radiology No results found.  Procedures Procedures    Medications Ordered in ED Medications - No data to display  ED Course/ Medical Decision Making/ A&P Clinical Course as of 01/01/24 1724  Fri Dec 31, 2023  2135 CBC(!) Leukocytosis noted.  This has been present on previous labs.  Metabolic panel consistent with his chronic kidney disease [JK]  2254 Case discussed with Dr. Melanee Spry. [JK]  2258 Case reviewed with Dr. Gasper Sells [JK]    Clinical Course User Index [JK] Linwood Dibbles, MD                                 Medical Decision Making Problems  Addressed: Anemia, unspecified type: chronic illness or injury with exacerbation, progression, or side effects of treatment  Amount and/or Complexity of Data Reviewed Labs: ordered. Decision-making details documented in ED Course.  Risk Prescription drug management. Decision regarding hospitalization.  Patient presented to the ED for evaluation of worsening anemia.  Patient has history of chronic kidney disease and anemia.  Patient's wife states he has received transfusions in the past.  No signs of active bleeding at this time.  I have sent off the Hemoccult.  Patient's hemoglobin is down to 7.1.  Month ago was 8.9.  Patient was sent to the ED for blood transfusion.  I will order 1 unit of blood.  With his chronic kidney disease he will require close monitoring to assess for any edema.  Patient is scheduled for repeat dialysis tomorrow.  I will consult the medical service for admission.         Final Clinical Impression(s) / ED Diagnoses Final diagnoses:  Anemia, unspecified type    Rx / DC Orders ED Discharge Orders     None         Linwood Dibbles, MD 01/01/24 1724

## 2023-12-31 NOTE — ED Triage Notes (Signed)
The pt is a dialysis pt that was dialyzed yesterday  he had blood drawn yesterday and they called this afternoon for a low hgb  6.8  dialysis catheter in his rt chest

## 2024-01-01 DIAGNOSIS — E1122 Type 2 diabetes mellitus with diabetic chronic kidney disease: Secondary | ICD-10-CM

## 2024-01-01 DIAGNOSIS — D649 Anemia, unspecified: Secondary | ICD-10-CM

## 2024-01-01 DIAGNOSIS — L8990 Pressure ulcer of unspecified site, unspecified stage: Secondary | ICD-10-CM

## 2024-01-01 DIAGNOSIS — Z794 Long term (current) use of insulin: Secondary | ICD-10-CM

## 2024-01-01 DIAGNOSIS — I639 Cerebral infarction, unspecified: Secondary | ICD-10-CM | POA: Diagnosis not present

## 2024-01-01 DIAGNOSIS — I480 Paroxysmal atrial fibrillation: Secondary | ICD-10-CM

## 2024-01-01 LAB — BPAM RBC
Blood Product Expiration Date: 202503202359
ISSUE DATE / TIME: 202502212348
Unit Type and Rh: 7300

## 2024-01-01 LAB — TYPE AND SCREEN
ABO/RH(D): B POS
Antibody Screen: NEGATIVE
Unit division: 0

## 2024-01-01 LAB — CBC
HCT: 25.6 % — ABNORMAL LOW (ref 39.0–52.0)
Hemoglobin: 7.9 g/dL — ABNORMAL LOW (ref 13.0–17.0)
MCH: 27.2 pg (ref 26.0–34.0)
MCHC: 30.9 g/dL (ref 30.0–36.0)
MCV: 88.3 fL (ref 80.0–100.0)
Platelets: 369 10*3/uL (ref 150–400)
RBC: 2.9 MIL/uL — ABNORMAL LOW (ref 4.22–5.81)
RDW: 18.6 % — ABNORMAL HIGH (ref 11.5–15.5)
WBC: 13.2 10*3/uL — ABNORMAL HIGH (ref 4.0–10.5)
nRBC: 0 % (ref 0.0–0.2)

## 2024-01-01 LAB — BASIC METABOLIC PANEL
Anion gap: 11 (ref 5–15)
BUN: 54 mg/dL — ABNORMAL HIGH (ref 6–20)
CO2: 25 mmol/L (ref 22–32)
Calcium: 8.8 mg/dL — ABNORMAL LOW (ref 8.9–10.3)
Chloride: 97 mmol/L — ABNORMAL LOW (ref 98–111)
Creatinine, Ser: 2.44 mg/dL — ABNORMAL HIGH (ref 0.61–1.24)
GFR, Estimated: 31 mL/min — ABNORMAL LOW (ref >60)
Glucose, Bld: 101 mg/dL — ABNORMAL HIGH (ref 70–99)
Potassium: 4.4 mmol/L (ref 3.5–5.1)
Sodium: 133 mmol/L — ABNORMAL LOW (ref 135–145)

## 2024-01-01 LAB — HEPATITIS B SURFACE ANTIGEN: Hepatitis B Surface Ag: NONREACTIVE

## 2024-01-01 MED ORDER — HEPARIN SODIUM (PORCINE) 5000 UNIT/ML IJ SOLN
5000.0000 [IU] | Freq: Three times a day (TID) | INTRAMUSCULAR | Status: DC
  Administered 2024-01-01: 5000 [IU] via SUBCUTANEOUS
  Filled 2024-01-01: qty 1

## 2024-01-01 MED ORDER — ONDANSETRON HCL 4 MG/2ML IJ SOLN
4.0000 mg | Freq: Four times a day (QID) | INTRAMUSCULAR | Status: DC | PRN
  Administered 2024-01-01: 4 mg via INTRAVENOUS
  Filled 2024-01-01: qty 2

## 2024-01-01 MED ORDER — ACETAMINOPHEN 500 MG PO TABS
500.0000 mg | ORAL_TABLET | Freq: Four times a day (QID) | ORAL | Status: DC | PRN
  Administered 2024-01-01: 500 mg via ORAL
  Filled 2024-01-01: qty 1

## 2024-01-01 MED ORDER — FINASTERIDE 5 MG PO TABS
5.0000 mg | ORAL_TABLET | Freq: Every day | ORAL | Status: DC
  Administered 2024-01-01: 5 mg via ORAL
  Filled 2024-01-01: qty 1

## 2024-01-01 MED ORDER — MELATONIN 5 MG PO TABS: 5.0000 mg | ORAL_TABLET | Freq: Every evening | ORAL | Status: DC | PRN

## 2024-01-01 MED ORDER — ACETAMINOPHEN 650 MG RE SUPP: 650.0000 mg | Freq: Four times a day (QID) | RECTAL | Status: DC | PRN

## 2024-01-01 MED ORDER — BISACODYL 5 MG PO TBEC: 5.0000 mg | DELAYED_RELEASE_TABLET | Freq: Every day | ORAL | Status: DC | PRN

## 2024-01-01 MED ORDER — CHLORHEXIDINE GLUCONATE CLOTH 2 % EX PADS: 6.0000 | MEDICATED_PAD | Freq: Every day | CUTANEOUS | Status: DC

## 2024-01-01 MED ORDER — AMIODARONE HCL 200 MG PO TABS
100.0000 mg | ORAL_TABLET | Freq: Every day | ORAL | Status: DC
  Administered 2024-01-01: 100 mg via ORAL
  Filled 2024-01-01: qty 1

## 2024-01-01 MED ORDER — ONDANSETRON HCL 4 MG PO TABS: 4.0000 mg | ORAL_TABLET | Freq: Four times a day (QID) | ORAL | Status: DC | PRN

## 2024-01-01 NOTE — Progress Notes (Signed)
 Pt completed HD tx without issue.Pt goal met.  01/01/24 1325  Vitals  Temp 97.8 F (36.6 C)  Temp Source Oral  BP 110/70  BP Location Left Arm  BP Method Automatic  Patient Position (if appropriate) Lying  Pulse Rate 72  Resp 18  Oxygen Therapy  SpO2 100 %  O2 Device Room Air  During Treatment Monitoring  Intra-Hemodialysis Comments Tx completed  Post Treatment  Dialyzer Clearance Lightly streaked  Hemodialysis Intake (mL) 0 mL  Liters Processed 72  Fluid Removed (mL) 2000 mL  Tolerated HD Treatment Yes  Post-Hemodialysis Comments Pt goal met.  Hemodialysis Catheter Right Internal jugular Double lumen Permanent (Tunneled)  Placement Date/Time: 12/28/23 1333   Placed prior to admission: No  Serial / Lot #: [id number]  Expiration Date: 04/08/30  Time Out: Correct patient;Correct site;Correct procedure  Maximum sterile barrier precautions: Mask;Cap;Sterile probe cover;Hand ...  Site Condition No complications  Blue Lumen Status Heparin locked  Red Lumen Status Heparin locked  Catheter fill solution Heparin 1000 units/ml  Catheter fill volume (Arterial) 1.9 cc  Catheter fill volume (Venous) 1.9  Dressing Type Transparent  Dressing Status Antimicrobial disc/dressing in place;Clean, Dry, Intact  Interventions New dressing;Dressing changed  Drainage Description None  Dressing Change Due 01/08/24  Post treatment catheter status Capped and Clamped

## 2024-01-01 NOTE — H&P (Addendum)
 History and Physical    Patient: Bruce Little WUJ:[id number] DOB: 1971-10-28 DOA: 12/31/2023 DOS: the patient was seen and examined on 01/01/2024 PCP: Street, Stephanie Coup, MD  Patient coming from: Home  Chief Complaint:  Chief Complaint  Patient presents with   low hgb   HPI: Bruce Little is a 53 y.o. 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  The wife provides most of the history as the patient only says a few words.  She reports that she has not seen any blood loss.  He does have a sacral decubitus which is not bleeding. She is not seeing any blood in his stool or anywhere else.  She does tell me that he used to receive iron infusions and something like erythropoietin almost weekly but those injections have stopped.  The patient does say that he has been a little bit more lightheaded and dizzy recently.  He denies any additional pain. In the emergency department his hemoglobin was 7.1.  His stool was Hemoccult negative.  The last hemoglobin was from 2 months ago and that was 8.9.  He will be admitted to the hospitalist service.  1 unit of packed red blood cells has been ordered and the patient is due for hemodialysis in the morning.  Review of Systems: unable to review all systems due to the inability of the patient to answer questions. 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   COVID    has had it 2 times, one mild and one wasn't   Depression    DM2 (diabetes mellitus, type 2) (HCC)    Family history of adverse reaction to anesthesia    Dad has a "hard time waking up" after  anesthesia   GERD (gastroesophageal reflux disease)    Hemorrhagic stroke (HCC) 04/30/2023   s/p right decompressive craniectomy and evacuation of hematoma on 04/30/2023   History of blood transfusion    HTN (hypertension)    Osteomyelitis of fifth toe of left foot (HCC) 08/13/2021   Osteomyelitis of fourth toe of left foot (HCC) 08/13/2021   Pneumonia    Sacral decubitus ulcer 05/2023   Stroke (HCC) 05/19/2021   unable to move left side   Past Surgical History:  Procedure Laterality Date   AMPUTATION Left 08/29/2021   Procedure: AMPUTATION OF FOURTH TOE AND RAY ALONG WITH REMAINING FITH METATARSAL;  Surgeon: Tarry Kos, MD;  Location: [facility];  Service: Orthopedics;  Laterality: Left;   AMPUTATION Left 09/03/2021   Procedure: LISFRANC AMPUTATION;  Surgeon: Tarry Kos, MD;  Location: [facility] OR;  Service: Orthopedics;  Laterality: Left;   AMPUTATION Left 10/29/2021   Procedure: AMPUTATION BELOW KNEE -LEFT;  Surgeon: Tarry Kos, MD;  Location: [facility];  Service: Orthopedics;  Laterality: Left;   APPLICATION OF WOUND VAC Left 07/07/2021   Procedure: APPLICATION OF WOUND VAC;  Surgeon: Tarry Kos, MD;  Location: [facility];  Service: Orthopedics;  Laterality: Left;   APPLICATION OF WOUND VAC Left 08/29/2021   Procedure: APPLICATION OF WOUND VAC;  Surgeon: Tarry Kos, MD;  Location:  [facility] OR;  Service: Orthopedics;  Laterality: Left;   DIALYSIS/PERMA CATHETER INSERTION N/A 12/28/2023   Procedure: DIALYSIS/PERMA CATHETER INSERTION;  Surgeon: Dagoberto Ligas, MD;  Location: [facility] INVASIVE CV LAB;  Service: Cardiovascular;  Laterality: N/A;   DIALYSIS/PERMA CATHETER REMOVAL N/A 12/28/2023   Procedure: DIALYSIS/PERMA CATHETER REMOVAL;  Surgeon: Dagoberto Ligas, MD;  Location: [facility] INVASIVE CV LAB;  Service: Cardiovascular;  Laterality: N/A;   I & D EXTREMITY Left 07/03/2021   Procedure: IRRIGATION AND DEBRIDEMENT ,FIFTH RAY  AMPUTATION LEFT FOOT, , WOUND VAC PLACEMENT;  Surgeon: Tarry Kos, MD;  Location:  [facility];  Service: Orthopedics;  Laterality: Left;   I & D EXTREMITY Left 07/07/2021   Procedure: IRRIGATION AND DEBRIDEMENT LEFT FOOT;  Surgeon: Tarry Kos, MD;  Location: [facility];  Service: Orthopedics;  Laterality: Left;   I & D EXTREMITY Left 08/29/2021   Procedure: IRRIGATION AND DEBRIDEMENT LEFT FOOT;  Surgeon: Tarry Kos, MD;  Location: [facility];  Service: Orthopedics;  Laterality: Left;   IR FLUORO GUIDE CV LINE RIGHT  07/09/2021   IR REMOVAL TUN CV CATH W/O FL  10/08/2021   IR REMOVAL TUN CV CATH W/O FL  07/13/2023   IR REPLACE G-TUBE SIMPLE WO FLUORO  09/20/2023   IR US GUIDE VASC ACCESS RIGHT  07/09/2021   TRACHEOSTOMY     05/14/2023 - 07/23/2023   VITRECTOMY Left    [facility]   Social History:  reports that he has never smoked. He has never used smokeless tobacco. He reports that he does not currently use alcohol. He reports that he does not use drugs.  No Known Allergies  Family History  Problem Relation Age of Onset   Stroke Mother    Cancer Mother    Heart disease Father     Prior to Admission medications   Medication Sig Start Date End Date Taking? Authorizing Provider  amiodarone (PACERONE) 100 MG tablet Take 100 mg by mouth daily.    [provider]  amLODipine (NORVASC) 10 MG tablet Take 10 mg by mouth daily.    [provider]  amoxicillin-clavulanate (AUGMENTIN) 875-125 MG tablet Take 1 tablet by mouth 2 (two) times daily. 12/13/23   [provider]  ascorbic acid (VITAMIN C) 500 MG tablet Take 1 tablet (500 mg total) by mouth 2 (two) times daily. 10/02/23   [name], MD  ASHWAGANDHA PO Take 2 tablets by mouth at bedtime.    [provider]  aspirin EC 81 MG tablet Take 81 mg by mouth at bedtime. Swallow whole.    [provider]  atorvastatin (LIPITOR) 40 MG tablet Take 40 mg by mouth at bedtime.    [provider]  carvedilol (COREG) 25 MG tablet Take 1 tablet (25 mg total) by mouth 2 (two) times  daily with a meal. 10/02/23   [name], MD  cloNIDine (CATAPRES) 0.1 MG tablet Take 1 tablet (0.1 mg total) by mouth daily. 10/02/23   [name], MD  diphenhydramine-acetaminophen (TYLENOL PM) 25-500 MG TABS tablet Take 2 tablets by mouth at bedtime as needed.    [provider]  finasteride (PROSCAR) 5 MG tablet Take 5 mg by mouth daily.    [provider]  furosemide (LASIX) 40 MG tablet Take 40 mg by mouth daily.    [provider]  glipiZIDE (GLUCOTROL) 5 MG tablet Take 1 tablet (5 mg total) by mouth daily. 05/21/21 12/20/23  Merlene Laughter, DO  hydrALAZINE (APRESOLINE) 25 MG tablet Take 25 mg by mouth 3 (three) times daily.    [provider]  LANTUS SOLOSTAR 100 UNIT/ML Solostar Pen Inject 5 Units into the skin daily. 10/02/23   [name], MD  levETIRAcetam (KEPPRA) 750 MG tablet Take 1,500 mg by mouth 2 (two) times daily.    [provider]  losartan (COZAAR) 50 MG tablet Take 1 tablet (50 mg total) by mouth daily. 10/02/23   [name], MD  melatonin 5 MG TABS Take 5-10 mg by mouth at bedtime as needed (sleep).    [provider]  methocarbamol (ROBAXIN) 500 MG tablet Take 1 tablet (500 mg total) by mouth every 8 (eight) hours as needed for muscle spasms. Patient taking differently: Take 500 mg by mouth 2 (two) times daily. May take a third 500 mg dose midday as needed for muscle spasms 10/02/23   [name], MD  multivitamin (RENA-VIT) TABS tablet Take 1 tablet by mouth at bedtime. 10/02/23   [name], MD  ondansetron (ZOFRAN-ODT) 4 MG disintegrating tablet Take 4 mg by mouth every 8 (eight) hours as needed for nausea or vomiting.    [provider]  pantoprazole (PROTONIX) 40 MG tablet Take 1 tablet (40 mg total) by mouth daily. 05/22/21   Marguerita Merles Latif, DO  PARoxetine (PAXIL) 10 MG tablet Take 10 mg by mouth daily.    [provider]   polyethylene glycol (MIRALAX / GLYCOLAX) 17 g packet Take 17 g by mouth 2 (two) times daily. Patient taking differently: Take 17 g by mouth daily as needed for moderate constipation. 11/03/23   Osvaldo Shipper, MD  promethazine (PHENERGAN) 12.5 MG tablet Take 1 tablet (12.5 mg total) by mouth every 6 (six) hours as needed for nausea or vomiting. 11/03/23   Osvaldo Shipper, MD  senna-docusate (SENOKOT-S) 8.6-50 MG tablet Take 2 tablets by mouth 2 (two) times daily. Patient taking differently: Take 2 tablets by mouth daily as needed for mild constipation. 11/03/23   Osvaldo Shipper, MD  terazosin (HYTRIN) 1 MG capsule Take 1 mg by mouth at bedtime.    [provider]  Vitamin D, Ergocalciferol, (DRISDOL) 1.25 MG (50000 UNIT) CAPS capsule Take 1 capsule (50,000 Units total) by mouth every 7 (seven) days. 10/03/23   [name], MD    Physical Exam: Vitals:   01/01/24 0000 01/01/24 0007 01/01/24 0015 01/01/24 0030  BP: 111/69  113/70 120/72  Pulse:      Resp: 18  13 20   Temp:  97.9 F (36.6 C)    TempSrc:      SpO2:      Weight:      Height:       Physical Exam:  General: No acute distress, well developed, well nourished HEENT: Normocephalic, atraumatic, PERRL Cardiovascular: Normal rate and rhythm. Rt Distal pulse intact. Pulmonary: Normal pulmonary effort, normal breath sounds Gastrointestinal: Nondistended abdomen, soft, non-tender, normoactive bowel sounds, no organomegaly Musculoskeletal:no lower ext edema. Right foot in heel protection boot. Left stump non tender. Lymphadenopathy: No cervical LAD. Skin: Skin is warm and dry. Neuro: left hemiparesis. Left neglect. AAOx3. PSYCH: Attentive and cooperative  Data Reviewed:  Results for orders placed or performed during the hospital encounter of 12/31/23 (from the past 24 hours)  Comprehensive metabolic panel     Status: Abnormal   Collection Time: 12/31/23  5:48 PM  Result Value Ref Range   Sodium 134 (L) 135  - 145 mmol/L  Potassium 4.4 3.5 - 5.1 mmol/L   Chloride 98 98 - 111 mmol/L   CO2 25 22 - 32 mmol/L   Glucose, Bld 117 (H) 70 - 99 mg/dL   BUN 44 (H) 6 - 20 mg/dL   Creatinine, Ser 0.98 (H) 0.61 - 1.24 mg/dL   Calcium 9.0 8.9 - 11.9 mg/dL   Total Protein 6.4 (L) 6.5 - 8.1 g/dL   Albumin 1.6 (L) 3.5 - 5.0 g/dL   AST 31 15 - 41 U/L   ALT 42 0 - 44 U/L   Alkaline Phosphatase 142 (H) 38 - 126 U/L   Total Bilirubin 0.5 0.0 - 1.2 mg/dL   GFR, Estimated 37 (L) >60 mL/min   Anion gap 11 5 - 15  CBC     Status: Abnormal   Collection Time: 12/31/23  5:48 PM  Result Value Ref Range   WBC 18.7 (H) 4.0 - 10.5 K/uL   RBC 2.66 (L) 4.22 - 5.81 MIL/uL   Hemoglobin 7.1 (L) 13.0 - 17.0 g/dL   HCT 14.7 (L) 82.9 - 56.2 %   MCV 91.0 80.0 - 100.0 fL   MCH 26.7 26.0 - 34.0 pg   MCHC 29.3 (L) 30.0 - 36.0 g/dL   RDW 13.0 (H) 86.5 - 78.4 %   Platelets 424 (H) 150 - 400 K/uL   nRBC 0.0 0.0 - 0.2 %  Sample to Blood Bank     Status: None   Collection Time: 12/31/23  5:57 PM  Result Value Ref Range   Blood Bank Specimen SAMPLE AVAILABLE FOR TESTING    Sample Expiration      01/03/2024,2359 Performed at [facility], [address] [place], [place] [address]   Type and screen [facility]     Status: None (Preliminary result)   Collection Time: 12/31/23  5:57 PM  Result Value Ref Range   ABO/RH(D) B POS    Antibody Screen NEG    Sample Expiration 01/03/2024,2359    Unit Number [id number]    Blood Component Type RED CELLS,LR    Unit division 00    Status of Unit ISSUED    Transfusion Status OK TO TRANSFUSE    Crossmatch Result      Compatible Performed at [facility], [address], [place], [place] [address]   POC occult blood, ED     Status: None   Collection Time: 12/31/23  9:48 PM  Result Value Ref Range   Fecal Occult Bld NEGATIVE NEGATIVE  Prepare RBC (crossmatch)     Status: None   Collection Time: 12/31/23 10:21 PM  Result Value Ref Range   Order  Confirmation      ORDER PROCESSED BY BLOOD BANK Performed at [facility], [address], [place], [place] [address]      Assessment and Plan: Recurrent anemia -unclear if this is due to end-stage renal disease versus blood loss. ESRD favored at this time. - Will transfuse and defer to nephrology  2.  Multiple decubiti - the patient's wife asked me to not disturb his bandages.  The patient is followed by home health care and his bandages are done every other day.  He has a pressure ulcer on the back of his heel and 1 on his sacrum. - Continue wound care while hospitalized.  3. ESRD -hemodialysis on Tuesday, Thursday, and Saturday -Nephrology has been consulted  4.  Chronic anticoagulation -he may be on Eliquis because of his history of  PE. -Continue Eliquis  5.  Seizure prevention possibly due to history of hemorrhagic CVA -  Continue Keppra  6. Dmt2 -resume outpatient medications  His outpatient medication list has not yet been verified.   Advance Care Planning:   Code Status: Full Code the patient names his wife as a surrogate decision maker and wants to be full code.  Consults: Nephrology  Family Communication: Patient's wife at bedside  Severity of Illness: The appropriate patient status for this patient is INPATIENT. Inpatient status is judged to be reasonable and necessary in order to provide the required intensity of service to ensure the patient's safety. The patient's presenting symptoms, physical exam findings, and initial radiographic and laboratory data in the context of their chronic comorbidities is felt to place them at high risk for further clinical deterioration. Furthermore, it is not anticipated that the patient will be medically stable for discharge from the hospital within 2 midnights of admission.   * I certify that at the point of admission it is my clinical judgment that the patient will require inpatient hospital care spanning beyond 2 midnights  from the point of admission due to high intensity of service, high risk for further deterioration and high frequency of surveillance required.*  Author: Buena Irish, MD 01/01/2024 12:38 AM  For on call review [email or web address].

## 2024-01-01 NOTE — ED Notes (Signed)
 Just assumed care of patient. Patient has blood product going and is stable at the present time. Patient wife is at bedside with patient. Patient is A&O times 4 will continue to monitor for any medication.

## 2024-01-01 NOTE — Discharge Summary (Signed)
 Physician Discharge Summary   Patient: Bruce Little MRN: [id number] DOB: November 02, 1971  Admit date:     12/31/2023  Discharge date: 01/01/24  Discharge Physician: Deanna Artis   PCP: Street, Stephanie Coup, MD   Recommendations at discharge:   At this time patient will be discharged home.  If you experience any symptoms such as fever, vomiting, shortness of breath, chest pain, abdominal pain, or other concerning symptoms, please call your primary care provider or go to the emergency department immediately.  Discharge Diagnoses: Principal Problem:   Anemia Active Problems:   DM2 (diabetes mellitus, type 2) (HCC)   Acute ischemic stroke (HCC)   Paroxysmal A-fib (HCC)  Resolved Problems:   * No resolved hospital problems. *  Hospital Course: Bruce Little is a 53 y.o. 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   Assessment and Plan:  Acute on chronic anemia of ESRD - Status post 1 unit RBCs during hemodialysis.  Followed closely by nephrology.  No obvious signs of GI loss or active bleeding.  End-stage renal disease - Receives hemodialysis TTS.  Received dialysis today.  Per nephrology able to be discharged.  Multiple decubitus ulcers - Per patient's wife, bandages were not disturbed.  Patient follows closely with home health care and bandages are exchanged every other day.  Has a pressure ulcer on back of his heel and 1 on his sacrum.  Will continue outpatient/home health wound care as previously performed.       Consultants: Nephrology Procedures performed: Hemodialysis Disposition: Home Diet recommendation:  Discharge Diet Orders (From admission, onward)     Start     Ordered    01/01/24 0000  Diet - low sodium heart healthy        01/01/24 1423           Renal diet DISCHARGE MEDICATION: Allergies as of 01/01/2024   No Known Allergies      Medication List     TAKE these medications    acetaminophen 500 MG tablet Commonly known as: TYLENOL Take 1,000 mg by mouth every 6 (six) hours as needed for mild pain (pain score 1-3) or headache.   amiodarone 100 MG tablet Commonly known as: PACERONE Take 100 mg by mouth daily.   amLODipine 10 MG tablet Commonly known as: NORVASC Take 10 mg by mouth daily.   ascorbic acid 500 MG tablet Commonly known as: VITAMIN C Take 1 tablet (500 mg total) by mouth 2 (two) times daily.   ASHWAGANDHA PO Take 1 Dose by mouth at bedtime. 1 dose- 2 gummies   aspirin EC 81 MG tablet Take 81 mg by mouth at bedtime. Swallow whole.   atorvastatin 40 MG tablet Commonly known as: LIPITOR Take 40 mg by mouth at bedtime.   CALCIUM 500 + D3 PO Take 1 tablet by mouth daily.   carvedilol 25 MG tablet Commonly known as: COREG Take 1 tablet (25 mg total) by mouth 2 (two) times daily with a meal.   cloNIDine 0.1 MG tablet Commonly known as: CATAPRES Take 1 tablet (0.1 mg total) by mouth daily.   diphenhydramine-acetaminophen 25-500 MG Tabs tablet Commonly known as: TYLENOL PM Take 2 tablets by mouth at bedtime as needed.   doxycycline 100  MG capsule Commonly known as: VIBRAMYCIN Take 100 mg by mouth 2 (two) times daily.   finasteride 5 MG tablet Commonly known as: PROSCAR Take 5 mg by mouth daily.   furosemide 40 MG tablet Commonly known as: LASIX Take 40 mg by mouth daily.   glipiZIDE 5 MG tablet Commonly known as: Glucotrol Take 1 tablet (5 mg total) by mouth daily.   hydrALAZINE 25 MG tablet Commonly known as: APRESOLINE Take 25 mg by mouth 3 (three) times daily.   Lantus SoloStar 100 UNIT/ML Solostar Pen Generic drug: insulin glargine Inject 5 Units into the skin daily. What changed: when to take  this   levETIRAcetam 750 MG tablet Commonly known as: KEPPRA Take 1,500 mg by mouth 2 (two) times daily.   losartan 50 MG tablet Commonly known as: COZAAR Take 1 tablet (50 mg total) by mouth daily.   melatonin 5 MG Tabs Take 5-10 mg by mouth at bedtime as needed (sleep).   methocarbamol 500 MG tablet Commonly known as: ROBAXIN Take 1 tablet (500 mg total) by mouth every 8 (eight) hours as needed for muscle spasms. What changed:  when to take this additional instructions   multivitamin Tabs tablet Take 1 tablet by mouth at bedtime.   mupirocin ointment 2 % Commonly known as: BACTROBAN Apply 1 Application topically 2 (two) times daily.   NATURAL VITAMIN A PO Take 3,000 mcg by mouth daily.   ondansetron 4 MG disintegrating tablet Commonly known as: ZOFRAN-ODT Take 4 mg by mouth every 8 (eight) hours as needed for nausea or vomiting.   pantoprazole 40 MG tablet Commonly known as: PROTONIX Take 1 tablet (40 mg total) by mouth daily.   PARoxetine 10 MG tablet Commonly known as: PAXIL Take 10 mg by mouth daily.   polyethylene glycol 17 g packet Commonly known as: MIRALAX / GLYCOLAX Take 17 g by mouth 2 (two) times daily. What changed:  when to take this reasons to take this   promethazine 12.5 MG tablet Commonly known as: PHENERGAN Take 1 tablet (12.5 mg total) by mouth every 6 (six) hours as needed for nausea or vomiting.   senna-docusate 8.6-50 MG tablet Commonly known as: Senokot-S Take 2 tablets by mouth 2 (two) times daily. What changed:  when to take this reasons to take this   terazosin 1 MG capsule Commonly known as: HYTRIN Take 1 mg by mouth at bedtime.   Vitamin D (Ergocalciferol) 1.25 MG (50000 UNIT) Caps capsule Commonly known as: DRISDOL Take 1 capsule (50,000 Units total) by mouth every 7 (seven) days.        Discharge Exam: Filed Weights   12/31/23 1744 01/01/24 1001 01/01/24 1328  Weight: 80.3 kg 80.1 kg 78 kg   GENERAL:  Alert,  pleasant, disheveled HEENT:  EOMI CARDIOVASCULAR:  RRR, no murmurs appreciated RESPIRATORY:  Clear to auscultation, no wheezing, rales, or rhonchi GASTROINTESTINAL:  Soft, nontender, nondistended EXTREMITIES: Left BKA NEURO: Chronic left weakness SKIN:  No rashes noted PSYCH:  Appropriate mood and affect    Condition at discharge: improving  The results of significant diagnostics from this hospitalization (including imaging, microbiology, ancillary and laboratory) are listed below for reference.   Imaging Studies: PERIPHERAL VASCULAR CATHETERIZATION Result Date: 12/28/2023 Patient presents with poor flows in his right IJ tunneled hemodialysis catheter (19 cm cuff to tip- Palindrome) that was placed 5 months ago. On examination, aspiration from both ports is good however flushing from the arterial port is sluggish. Chest x-ray confirms the catheter tip is  a little higher positioned in the SVC. The catheter cuff is right at the exit site.  Summary: 1) The patient had a successful 23 cm CTT Palindrome hemodialysis catheter exchange in the right internal jugular vein. 2) No sheath noted through either port. 3) Okay to use catheter immediately. Description of procedure: The right neck, chest and the catheter were prepped and draped in the usual sterile fashion. The exit site and adjacent tunnel tract were anesthetized with lidocaine 1% with epinephrine. The cuff was dissected free with a curved Kelly and manual traction. The catheter was withdrawn and venogram through each of the ports was performed; there was no fibrin sheath evident through either port. A hydrophilic wire was manipulated and advanced through the arterial port and the tip was parked in the IVC. The catheter was completely removed and noted to be entirely intact. A new 23 cm cuff to tip Palindrome catheter was inserted over the guidewire and the tip parked in the junction of the IVC/right atrium and at that point the cuff was  approximately 1 cm deep to the exit site. Aspiration and flushing of both limbs of the catheter confirmed excellent flow. No kinks were visible on fluoroscopic imaging. Both limbs of the catheter were locked with heparin and sterile caps were placed. The hub was secured on to the chest wall with 2-0 nylon wing sutures. Sterile dressings were placed, and the patient returned to recovery in stable condition. Sedation: none Sedation time: n/a Contrast: 3 mL Monitoring: Because of the patient's comorbid conditions and sedation during the procedure, continuous EKG monitoring and O2 saturation monitoring was performed throughout the procedure by the RN. There were no abnormal arrhythmias encountered. Complications: None. Diagnoses:  T82.49XA Other complication of vascular dialysis catheter (Poor flows) N18.6 End stage renal disease Z99.2 Dialysis dependence Procedures Coding: 54098 Tunneled catheter exchange 77001  Fluoroscopy guidance for catheter exchange. J1914 Contrast Recommendations: Remove the suture in 3 weeks. 2.   Report any blood flow problems to [facility]. Discharge: The patient was discharged home in stable condition. The patient was given education regarding the care of the catheter and specific instructions in case of any problems.    Microbiology: Results for orders placed or performed during the hospital encounter of 11/01/23  Resp panel by RT-PCR (RSV, Flu A&B, Covid) Urine, Catheterized     Status: Abnormal   Collection Time: 11/01/23  8:06 AM   Specimen: Urine, Catheterized; Nasal Swab  Result Value Ref Range Status   SARS Coronavirus 2 by RT PCR POSITIVE (A) NEGATIVE Final   Influenza A by PCR NEGATIVE NEGATIVE Final   Influenza B by PCR NEGATIVE NEGATIVE Final    Comment: (NOTE) The Xpert Xpress SARS-CoV-2/FLU/RSV plus assay is intended as an aid in the diagnosis of influenza from Nasopharyngeal swab specimens and should not be used as a sole basis for treatment. Nasal washings  and aspirates are unacceptable for Xpert Xpress SARS-CoV-2/FLU/RSV testing.  Fact Sheet for Patients: BloggerCourse.com  Fact Sheet for Healthcare Providers: SeriousBroker.it  This test is not yet approved or cleared by the [place] FDA and has been authorized for detection and/or diagnosis of SARS-CoV-2 by FDA under an Emergency Use Authorization (EUA). This EUA will remain in effect (meaning this test can be used) for the duration of the COVID-19 declaration under Section 564(b)(1) of the Act, 21 U.S.C. section 360bbb-3(b)(1), unless the authorization is terminated or revoked.     Resp Syncytial Virus by PCR NEGATIVE NEGATIVE Final    Comment: (  NOTE) Fact Sheet for Patients: BloggerCourse.com  Fact Sheet for Healthcare Providers: SeriousBroker.it  This test is not yet approved or cleared by the [place] FDA and has been authorized for detection and/or diagnosis of SARS-CoV-2 by FDA under an Emergency Use Authorization (EUA). This EUA will remain in effect (meaning this test can be used) for the duration of the COVID-19 declaration under Section 564(b)(1) of the Act, 21 U.S.C. section 360bbb-3(b)(1), unless the authorization is terminated or revoked.  Performed at [facility], [address] [place], [place] [address]   Urine Culture     Status: Abnormal   Collection Time: 11/01/23  8:06 AM   Specimen: Urine, Clean Catch  Result Value Ref Range Status   Specimen Description URINE, CLEAN CATCH  Final   Special Requests   Final    NONE Performed at [facility], [address] [place], [place] [address]    Culture >=100,000 COLONIES/mL PSEUDOMONAS AERUGINOSA (A)  Final   Report Status 11/03/2023 FINAL  Final   Organism ID, Bacteria PSEUDOMONAS AERUGINOSA (A)  Final      Susceptibility   Pseudomonas aeruginosa - MIC*    CEFTAZIDIME 16  INTERMEDIATE Intermediate     CIPROFLOXACIN 1 INTERMEDIATE Intermediate     GENTAMICIN <=1 SENSITIVE Sensitive     IMIPENEM 1 SENSITIVE Sensitive     * >=100,000 COLONIES/mL PSEUDOMONAS AERUGINOSA    Labs: CBC: Recent Labs  Lab 12/31/23 1748 01/01/24 0850  WBC 18.7* 13.2*  HGB 7.1* 7.9*  HCT 24.2* 25.6*  MCV 91.0 88.3  PLT 424* 369   Basic Metabolic Panel: Recent Labs  Lab 12/31/23 1748 01/01/24 0850  NA 134* 133*  K 4.4 4.4  CL 98 97*  CO2 25 25  GLUCOSE 117* 101*  BUN 44* 54*  CREATININE 2.10* 2.44*  CALCIUM 9.0 8.8*   Liver Function Tests: Recent Labs  Lab 12/31/23 1748  AST 31  ALT 42  ALKPHOS 142*  BILITOT 0.5  PROT 6.4*  ALBUMIN 1.6*   CBG: Recent Labs  Lab 12/28/23 1223 12/28/23 1236 12/28/23 1424  GLUCAP 15* 191* 52*    Discharge time spent: less than 30 minutes.  Signed: Deanna Artis, DO [facility] 01/01/2024

## 2024-01-01 NOTE — Progress Notes (Signed)
 Brief Nephrology Progress Note  Patient sent from dialysis unit for low Hgb. Now s/p 1 unit of PRBCs. No obvious sign of GI blood loss. Will provide his regular HD today. He is okay to DC after from our perspective. HD likely at 1pm. If he remains inpatient longer we will do formal consult.  I saw and evaluated the patient.

## 2024-01-03 ENCOUNTER — Other Ambulatory Visit: Payer: Self-pay

## 2024-01-03 ENCOUNTER — Encounter (HOSPITAL_COMMUNITY): Payer: Self-pay

## 2024-01-03 ENCOUNTER — Ambulatory Visit (HOSPITAL_COMMUNITY)
Admission: RE | Admit: 2024-01-03 | Discharge: 2024-01-03 | Disposition: A | Discharge status: Home / Self Care | Payer: 59 | Source: Ambulatory Visit | Attending: Urology | Admitting: Urology

## 2024-01-03 DIAGNOSIS — Z8673 Personal history of transient ischemic attack (TIA), and cerebral infarction without residual deficits: Secondary | ICD-10-CM | POA: Diagnosis not present

## 2024-01-03 DIAGNOSIS — I132 Hypertensive heart and chronic kidney disease with heart failure and with stage 5 chronic kidney disease, or end stage renal disease: Secondary | ICD-10-CM | POA: Insufficient documentation

## 2024-01-03 DIAGNOSIS — E1122 Type 2 diabetes mellitus with diabetic chronic kidney disease: Secondary | ICD-10-CM | POA: Diagnosis not present

## 2024-01-03 DIAGNOSIS — N186 End stage renal disease: Secondary | ICD-10-CM | POA: Insufficient documentation

## 2024-01-03 DIAGNOSIS — Z01818 Encounter for other preprocedural examination: Secondary | ICD-10-CM | POA: Insufficient documentation

## 2024-01-03 DIAGNOSIS — R339 Retention of urine, unspecified: Secondary | ICD-10-CM | POA: Diagnosis present

## 2024-01-03 LAB — GLUCOSE, CAPILLARY
Glucose-Capillary: 73 mg/dL (ref 70–99)
Glucose-Capillary: 74 mg/dL (ref 70–99)

## 2024-01-03 LAB — CBC
HCT: 30.8 % — ABNORMAL LOW (ref 39.0–52.0)
Hemoglobin: 9.3 g/dL — ABNORMAL LOW (ref 13.0–17.0)
MCH: 26.6 pg (ref 26.0–34.0)
MCHC: 30.2 g/dL (ref 30.0–36.0)
MCV: 88.3 fL (ref 80.0–100.0)
Platelets: 290 10*3/uL (ref 150–400)
RBC: 3.49 MIL/uL — ABNORMAL LOW (ref 4.22–5.81)
RDW: 18.3 % — ABNORMAL HIGH (ref 11.5–15.5)
WBC: 15.5 10*3/uL — ABNORMAL HIGH (ref 4.0–10.5)
nRBC: 0 % (ref 0.0–0.2)

## 2024-01-03 MED ORDER — SODIUM CHLORIDE 0.9 % IV SOLN
INTRAVENOUS | Status: AC | PRN
  Administered 2024-01-03: 2 g via INTRAVENOUS

## 2024-01-03 MED ORDER — FENTANYL CITRATE (PF) 100 MCG/2ML IJ SOLN
INTRAMUSCULAR | Status: AC
  Filled 2024-01-03: qty 2

## 2024-01-03 MED ORDER — FENTANYL CITRATE (PF) 100 MCG/2ML IJ SOLN
INTRAMUSCULAR | Status: AC | PRN
  Administered 2024-01-03: 25 ug via INTRAVENOUS
  Administered 2024-01-03: 50 ug via INTRAVENOUS

## 2024-01-03 MED ORDER — MIDAZOLAM HCL 2 MG/2ML IJ SOLN
INTRAMUSCULAR | Status: AC
  Filled 2024-01-03: qty 2

## 2024-01-03 MED ORDER — MIDAZOLAM HCL 2 MG/2ML IJ SOLN
INTRAMUSCULAR | Status: AC | PRN
  Administered 2024-01-03 (×2): 1 mg via INTRAVENOUS

## 2024-01-03 MED ORDER — LIDOCAINE HCL 1 % IJ SOLN
10.0000 mL | Freq: Once | INTRAMUSCULAR | Status: AC
  Administered 2024-01-03: 10 mL via INTRADERMAL

## 2024-01-03 MED ORDER — SODIUM CHLORIDE 0.9% FLUSH: 5.0000 mL | Freq: Three times a day (TID) | INTRAVENOUS | Status: DC

## 2024-01-03 MED ORDER — SODIUM CHLORIDE 0.9 % IV SOLN
INTRAVENOUS | Status: AC
  Filled 2024-01-03: qty 20

## 2024-01-03 MED ORDER — HYDROCODONE-ACETAMINOPHEN 5-325 MG PO TABS: 1.0000 | ORAL_TABLET | ORAL | Status: DC | PRN

## 2024-01-03 NOTE — Procedures (Signed)
 Interventional Radiology Procedure:   Indications: Urinary retention  Procedure: CT guided suprapubic catheter placement  Findings: 16 Fr suprapubic catheter in place.   Complications: None     EBL: Minimal  Plan: Discharge in 2 hours.  Plan for exchange in 6 weeks.  Jeffifer Rabold R. Lowella Dandy, MD  Pager: [phone]

## 2024-01-03 NOTE — H&P (Signed)
 Chief Complaint: Patient was seen in consultation today for urinary retention; supra pubic catheter placement at the request of Pace,Maryellen D  Referring Physician(s): Pace,Maryellen D  Supervising Physician: Ruel Favors  Patient Status: [facility] - Out-pt  History of Present Illness: Bruce Little is a 53 y.o. 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   COVID    has had it 2 times, one mild and one wasn't   Depression    DM2 (diabetes mellitus, type 2) (HCC)    Family history of adverse reaction to anesthesia    Dad has a "hard time waking up" after anesthesia   GERD (gastroesophageal reflux disease)    Hemorrhagic stroke (HCC) 04/30/2023   s/p right decompressive craniectomy and evacuation of hematoma on 04/30/2023   History of blood transfusion    HTN (hypertension)    Osteomyelitis of fifth toe of left foot (HCC) 08/13/2021   Osteomyelitis of fourth toe of left foot (HCC) 08/13/2021   Pneumonia    Sacral decubitus ulcer 05/2023   Stroke (HCC) 05/19/2021   unable to move left side    Past Surgical History:  Procedure Laterality Date   AMPUTATION Left 08/29/2021   Procedure: AMPUTATION OF FOURTH TOE AND RAY ALONG WITH REMAINING FITH METATARSAL;  Surgeon: Tarry Kos, MD;  Location: [facility] OR;  Service: Orthopedics;  Laterality: Left;   AMPUTATION Left 09/03/2021   Procedure: LISFRANC AMPUTATION;  Surgeon: Tarry Kos, MD;  Location: [facility] OR;  Service: Orthopedics;  Laterality: Left;   AMPUTATION Left 10/29/2021   Procedure: AMPUTATION BELOW KNEE -LEFT;  Surgeon: Tarry Kos, MD;  Location: [facility] OR;  Service: Orthopedics;  Laterality: Left;   APPLICATION OF  WOUND VAC Left 07/07/2021   Procedure: APPLICATION OF WOUND VAC;  Surgeon: Tarry Kos, MD;  Location: [facility] OR;  Service: Orthopedics;  Laterality: Left;   APPLICATION OF WOUND VAC Left 08/29/2021   Procedure: APPLICATION OF WOUND VAC;  Surgeon: Tarry Kos, MD;  Location: [facility] OR;  Service: Orthopedics;  Laterality: Left;   DIALYSIS/PERMA CATHETER INSERTION N/A 12/28/2023   Procedure: DIALYSIS/PERMA CATHETER INSERTION;  Surgeon: Dagoberto Ligas, MD;  Location: [facility] INVASIVE CV LAB;  Service: Cardiovascular;  Laterality: N/A;   DIALYSIS/PERMA CATHETER REMOVAL N/A 12/28/2023   Procedure: DIALYSIS/PERMA CATHETER REMOVAL;  Surgeon: Dagoberto Ligas, MD;  Location: [facility] INVASIVE CV LAB;  Service: Cardiovascular;  Laterality: N/A;   I & D EXTREMITY Left 07/03/2021   Procedure: IRRIGATION AND DEBRIDEMENT ,FIFTH RAY  AMPUTATION LEFT FOOT, , WOUND VAC PLACEMENT;  Surgeon: Tarry Kos, MD;  Location: [facility] OR;  Service: Orthopedics;  Laterality: Left;   I & D EXTREMITY Left 07/07/2021   Procedure: IRRIGATION AND DEBRIDEMENT LEFT FOOT;  Surgeon: Tarry Kos, MD;  Location: [facility] OR;  Service: Orthopedics;  Laterality: Left;   I & D EXTREMITY Left 08/29/2021   Procedure: IRRIGATION AND DEBRIDEMENT LEFT FOOT;  Surgeon: Tarry Kos, MD;  Location: [facility] OR;  Service: Orthopedics;  Laterality: Left;   IR FLUORO GUIDE CV LINE RIGHT  07/09/2021   IR REMOVAL TUN CV CATH W/O FL  10/08/2021  IR REMOVAL TUN CV CATH W/O FL  07/13/2023   IR REPLACE G-TUBE SIMPLE WO FLUORO  09/20/2023   IR US GUIDE VASC ACCESS RIGHT  07/09/2021   TRACHEOSTOMY     05/14/2023 - 07/23/2023   VITRECTOMY Left    Blanca eye    Allergies: Patient has no known allergies.  Medications: Prior to Admission medications   Medication Sig Start Date End Date Taking? Authorizing Provider  LANTUS SOLOSTAR 100 UNIT/ML Solostar Pen Inject 5 Units into the skin daily. Patient taking differently: Inject 5 Units into the skin at bedtime. 10/02/23  Yes  [name], MD  acetaminophen (TYLENOL) 500 MG tablet Take 1,000 mg by mouth every 6 (six) hours as needed for mild pain (pain score 1-3) or headache.   Yes [provider]  amiodarone (PACERONE) 100 MG tablet Take 100 mg by mouth daily.   Yes [provider]  amLODipine (NORVASC) 10 MG tablet Take 10 mg by mouth daily.   Yes [provider]  ascorbic acid (VITAMIN C) 500 MG tablet Take 1 tablet (500 mg total) by mouth 2 (two) times daily. 10/02/23  Yes [name], MD  ASHWAGANDHA PO Take 1 Dose by mouth at bedtime. 1 dose- 2 gummies   Yes [provider]  aspirin EC 81 MG tablet Take 81 mg by mouth at bedtime. Swallow whole.    [provider]  atorvastatin (LIPITOR) 40 MG tablet Take 40 mg by mouth at bedtime.   Yes [provider]  Calcium Carb-Cholecalciferol (CALCIUM 500 + D3 PO) Take 1 tablet by mouth daily.   Yes [provider]  carvedilol (COREG) 25 MG tablet Take 1 tablet (25 mg total) by mouth 2 (two) times daily with a meal. 10/02/23  Yes [name], MD  cloNIDine (CATAPRES) 0.1 MG tablet Take 1 tablet (0.1 mg total) by mouth daily. 10/02/23  Yes [name], MD  diphenhydramine-acetaminophen (TYLENOL PM) 25-500 MG TABS tablet Take 2 tablets by mouth at bedtime as needed.   Yes [provider]  doxycycline (VIBRAMYCIN) 100 MG capsule Take 100 mg by mouth 2 (two) times daily. Patient not taking: Reported on 01/01/2024 12/24/23   [provider]  finasteride (PROSCAR) 5 MG tablet Take 5 mg by mouth daily.   Yes [provider]  furosemide (LASIX) 40 MG tablet Take 40 mg by mouth daily.   Yes [provider]  glipiZIDE (GLUCOTROL) 5 MG tablet Take 1 tablet (5 mg total) by mouth daily. 05/21/21 05/08/24 Yes Sheikh, Omair Latif, DO  hydrALAZINE (APRESOLINE) 25 MG tablet Take 25 mg by mouth 3 (three) times daily.   Yes [provider]  levETIRAcetam  (KEPPRA) 750 MG tablet Take 1,500 mg by mouth 2 (two) times daily.   Yes [provider]  losartan (COZAAR) 50 MG tablet Take 1 tablet (50 mg total) by mouth daily. 10/02/23  Yes [name], MD  melatonin 5 MG TABS Take 5-10 mg by mouth at bedtime as needed (sleep).   Yes [provider]  methocarbamol (ROBAXIN) 500 MG tablet Take 1 tablet (500 mg total) by mouth every 8 (eight) hours as needed for muscle spasms. Patient taking differently: Take 500 mg by mouth 2 (two) times daily. May take a third 500 mg dose midday as needed for muscle spasms 10/02/23  Yes [name], MD  multivitamin (RENA-VIT) TABS tablet Take 1 tablet by mouth at bedtime. 10/02/23  Yes [name], MD  mupirocin ointment (BACTROBAN) 2 % Apply 1 Application topically 2 (two) times daily. 12/24/23  Yes [provider]  NATURAL VITAMIN A PO Take 3,000 mcg by mouth daily.   Yes [provider]  ondansetron (ZOFRAN-ODT) 4 MG disintegrating tablet Take 4 mg by mouth every 8 (eight) hours as needed for nausea or vomiting.   Yes [provider]  pantoprazole (PROTONIX) 40 MG tablet Take 1 tablet (40 mg total) by mouth daily. 05/22/21  Yes Sheikh, Omair Latif, DO  PARoxetine (PAXIL) 10 MG tablet Take 10 mg by mouth daily.   Yes [provider]  polyethylene glycol (MIRALAX / GLYCOLAX) 17 g packet Take 17 g by mouth 2 (two) times daily. Patient taking differently: Take 17 g by mouth daily as needed for moderate constipation. 11/03/23   Osvaldo Shipper, MD  promethazine (PHENERGAN) 12.5 MG tablet Take 1 tablet (12.5 mg total) by mouth every 6 (six) hours as needed for nausea or vomiting. 11/03/23  Yes Osvaldo Shipper, MD  senna-docusate (SENOKOT-S) 8.6-50 MG tablet Take 2 tablets by mouth 2 (two) times daily. Patient taking differently: Take 2 tablets by mouth 2 (two) times daily as needed for mild constipation. 11/03/23   Osvaldo Shipper, MD  terazosin  (HYTRIN) 1 MG capsule Take 1 mg by mouth at bedtime.   Yes [provider]  Vitamin D, Ergocalciferol, (DRISDOL) 1.25 MG (50000 UNIT) CAPS capsule Take 1 capsule (50,000 Units total) by mouth every 7 (seven) days. 10/03/23  Yes [name], MD     Family History  Problem Relation Age of Onset   Stroke Mother    Cancer Mother    Heart disease Father     Social History   Socioeconomic History   Marital status: Married    Spouse name: [name]   Number of children: Not on file   Years of education: Not on file   Highest education level: Not on file  Occupational History   Not on file  Tobacco Use   Smoking status: Never   Smokeless tobacco: Never  Vaping Use   Vaping status: Never Used  Substance and Sexual Activity   Alcohol use: Not Currently   Drug use: Never   Sexual activity: Not on file  Other Topics Concern   Not on file  Social History Narrative   Not on file   Social Drivers of Health   Financial Resource Strain: Low Risk  (08/10/2023)   Received from [facility]   Overall Financial Resource Strain (CARDIA)    Difficulty of Paying Living Expenses: Not hard at all  Food Insecurity: No Food Insecurity (11/01/2023)   Hunger Vital Sign    Worried About Running Out of Food in the Last Year: Never true    Ran Out of Food in the Last Year: Never true  Transportation Needs: No Transportation Needs (11/01/2023)   PRAPARE - Administrator, Civil Service (Medical): No    Lack of Transportation (Non-Medical): No  Physical Activity: Not on file  Stress: No Stress Concern Present (08/10/2023)   Received from [facility]   [facility] - Occupational Stress Questionnaire    Feeling of Stress : Not at all  Social Connections: Unknown (08/13/2023)   Received from [facility]   Social Network    Social Network: Not on file    Review of Systems: A 12 point ROS discussed and pertinent positives are indicated in  the HPI above.  All other systems are  negative.  Review of Systems  Constitutional:  Positive for fatigue. Negative for activity change and fever.  Respiratory:  Negative for cough and shortness of breath.   Cardiovascular:  Negative for chest pain.  Gastrointestinal:  Negative for abdominal pain.  Genitourinary:        Urinary retention  Neurological:  Positive for weakness.  Psychiatric/Behavioral:  Positive for decreased concentration. Negative for behavioral problems and confusion.     Vital Signs: BP 110/75   Pulse 71   Temp 97.8 F (36.6 C) (Oral)   Ht 6\' 2"  (1.88 m)   SpO2 99%   BMI 22.08 kg/m     Physical Exam HENT:     Mouth/Throat:     Mouth: Mucous membranes are moist.  Cardiovascular:     Rate and Rhythm: Normal rate and regular rhythm.     Heart sounds: Normal heart sounds.  Pulmonary:     Effort: Pulmonary effort is normal.     Breath sounds: Normal breath sounds. No wheezing.  Abdominal:     Tenderness: There is no abdominal tenderness.  Skin:    General: Skin is warm.  Neurological:     Mental Status: He is alert. Mental status is at baseline.  Psychiatric:     Comments: Wife at bedside She signs consents for pt     Imaging: PERIPHERAL VASCULAR CATHETERIZATION Result Date: 12/28/2023 Patient presents with poor flows in his right IJ tunneled hemodialysis catheter (19 cm cuff to tip- Palindrome) that was placed 5 months ago. On examination, aspiration from both ports is good however flushing from the arterial port is sluggish. Chest x-ray confirms the catheter tip is a little higher positioned in the SVC. The catheter cuff is right at the exit site.  Summary: 1) The patient had a successful 23 cm CTT Palindrome hemodialysis catheter exchange in the right internal jugular vein. 2) No sheath noted through either port. 3) Okay to use catheter immediately. Description of procedure: The right neck, chest and the catheter were prepped and draped in the usual  sterile fashion. The exit site and adjacent tunnel tract were anesthetized with lidocaine 1% with epinephrine. The cuff was dissected free with a curved Kelly and manual traction. The catheter was withdrawn and venogram through each of the ports was performed; there was no fibrin sheath evident through either port. A hydrophilic wire was manipulated and advanced through the arterial port and the tip was parked in the IVC. The catheter was completely removed and noted to be entirely intact. A new 23 cm cuff to tip Palindrome catheter was inserted over the guidewire and the tip parked in the junction of the IVC/right atrium and at that point the cuff was approximately 1 cm deep to the exit site. Aspiration and flushing of both limbs of the catheter confirmed excellent flow. No kinks were visible on fluoroscopic imaging. Both limbs of the catheter were locked with heparin and sterile caps were placed. The hub was secured on to the chest wall with 2-0 nylon wing sutures. Sterile dressings were placed, and the patient returned to recovery in stable condition. Sedation: none Sedation time: n/a Contrast: 3 mL Monitoring: Because of the patient's comorbid conditions and sedation during the procedure, continuous EKG monitoring and O2 saturation monitoring was performed throughout the procedure by the RN. There were no abnormal arrhythmias encountered. Complications: None. Diagnoses:  T82.49XA Other complication of vascular dialysis catheter (Poor flows) N18.6 End stage renal disease Z99.2 Dialysis dependence Procedures Coding: 19147 Tunneled catheter exchange  16109  Fluoroscopy guidance for catheter exchange. U0454 Contrast Recommendations: Remove the suture in 3 weeks. 2.   Report any blood flow problems to [facility]. Discharge: The patient was discharged home in stable condition. The patient was given education regarding the care of the catheter and specific instructions in case of any problems.     Labs:  CBC: Recent Labs    11/03/23 0422 12/31/23 1748 01/01/24 0850 01/03/24 0714  WBC 14.2* 18.7* 13.2* 15.5*  HGB 8.9* 7.1* 7.9* 9.3*  HCT 27.7* 24.2* 25.6* 30.8*  PLT 373 424* 369 290    COAGS: Recent Labs    09/18/23 0058  INR 1.1  APTT 29    BMP: Recent Labs    11/02/23 0454 11/03/23 0422 12/31/23 1748 01/01/24 0850  NA 133* 134* 134* 133*  K 3.2* 3.4* 4.4 4.4  CL 94* 98 98 97*  CO2 28 25 25 25   GLUCOSE 111* 82 117* 101*  BUN 20 22* 44* 54*  CALCIUM 8.5* 8.5* 9.0 8.8*  CREATININE 3.53* 2.41* 2.10* 2.44*  GFRNONAA 20* 32* 37* 31*    LIVER FUNCTION TESTS: Recent Labs    09/21/23 0831 09/21/23 1215 09/22/23 1631 09/23/23 0855 10/02/23 1403 11/01/23 0620 11/02/23 0454 12/31/23 1748  BILITOT 0.5  --  0.3  --   --  0.7  --  0.5  AST 27  --  31  --   --  27  --  31  ALT 28  --  30  --   --  28  --  42  ALKPHOS 117  --  116  --   --  120  --  142*  PROT 5.3*  --  4.5*  --   --  6.7  --  6.4*  ALBUMIN <1.5*   < > <1.5*   < > 2.0* 2.0* 1.9* 1.6*   < > = values in this interval not displayed.    TUMOR MARKERS: No results for input(s): "AFPTM", "CEA", "CA199", "CHROMGRNA" in the last 8760 hours.  Assessment and Plan:  Urinary retention Suprapubic catheter placement Risks and benefits discussed with the patient including bleeding, infection, damage to adjacent structures and sepsis.  All of the patient's questions were answered, patient is agreeable to proceed. Consent signed and in chart.  Thank you for this interesting consult.  I greatly enjoyed meeting [facility] and look forward to participating in their care.  A copy of this report was sent to the requesting provider on this date.  Electronically Signed: Robet Leu, PA-C 01/03/2024, 7:29 AM   I spent a total of  30 Minutes   in face to face in clinical consultation, greater than 50% of which was counseling/coordinating care for supra pubic catheter placement

## 2024-01-04 LAB — HEPATITIS B SURFACE ANTIBODY, QUANTITATIVE: Hep B S AB Quant (Post): <3.5 m[IU]/mL — ABNORMAL LOW

## 2024-01-05 ENCOUNTER — Other Ambulatory Visit (HOSPITAL_COMMUNITY): Payer: Self-pay | Admitting: Interventional Radiology

## 2024-01-05 ENCOUNTER — Ambulatory Visit (HOSPITAL_COMMUNITY)
Admission: RE | Admit: 2024-01-05 | Discharge: 2024-01-05 | Disposition: A | Discharge status: Home / Self Care | Payer: 59 | Source: Ambulatory Visit | Attending: Interventional Radiology

## 2024-01-05 ENCOUNTER — Other Ambulatory Visit: Payer: Self-pay

## 2024-01-05 ENCOUNTER — Encounter (HOSPITAL_BASED_OUTPATIENT_CLINIC_OR_DEPARTMENT_OTHER): Payer: 59 | Admitting: Internal Medicine

## 2024-01-05 ENCOUNTER — Observation Stay (HOSPITAL_COMMUNITY)
Admission: EM | Admit: 2024-01-05 | Discharge: 2024-01-08 | Disposition: A | Discharge status: Home / Self Care | Payer: 59 | Attending: Internal Medicine | Admitting: Internal Medicine

## 2024-01-05 ENCOUNTER — Other Ambulatory Visit (HOSPITAL_COMMUNITY): Payer: Self-pay | Admitting: Diagnostic Radiology

## 2024-01-05 ENCOUNTER — Other Ambulatory Visit (HOSPITAL_COMMUNITY): Payer: Self-pay | Admitting: Radiology

## 2024-01-05 VITALS — BP 119/71 | HR 74 | Resp 18

## 2024-01-05 DIAGNOSIS — I480 Paroxysmal atrial fibrillation: Secondary | ICD-10-CM | POA: Diagnosis not present

## 2024-01-05 DIAGNOSIS — L89154 Pressure ulcer of sacral region, stage 4: Secondary | ICD-10-CM

## 2024-01-05 DIAGNOSIS — Z9359 Other cystostomy status: Secondary | ICD-10-CM

## 2024-01-05 DIAGNOSIS — N186 End stage renal disease: Secondary | ICD-10-CM | POA: Insufficient documentation

## 2024-01-05 DIAGNOSIS — Z936 Other artificial openings of urinary tract status: Secondary | ICD-10-CM | POA: Diagnosis not present

## 2024-01-05 DIAGNOSIS — E1159 Type 2 diabetes mellitus with other circulatory complications: Secondary | ICD-10-CM | POA: Diagnosis not present

## 2024-01-05 DIAGNOSIS — L89159 Pressure ulcer of sacral region, unspecified stage: Secondary | ICD-10-CM | POA: Diagnosis not present

## 2024-01-05 DIAGNOSIS — R310 Gross hematuria: Secondary | ICD-10-CM | POA: Diagnosis not present

## 2024-01-05 DIAGNOSIS — Z7984 Long term (current) use of oral hypoglycemic drugs: Secondary | ICD-10-CM | POA: Insufficient documentation

## 2024-01-05 DIAGNOSIS — I120 Hypertensive chronic kidney disease with stage 5 chronic kidney disease or end stage renal disease: Secondary | ICD-10-CM | POA: Insufficient documentation

## 2024-01-05 DIAGNOSIS — R339 Retention of urine, unspecified: Secondary | ICD-10-CM

## 2024-01-05 DIAGNOSIS — Z992 Dependence on renal dialysis: Secondary | ICD-10-CM | POA: Diagnosis not present

## 2024-01-05 DIAGNOSIS — D631 Anemia in chronic kidney disease: Secondary | ICD-10-CM | POA: Insufficient documentation

## 2024-01-05 DIAGNOSIS — D62 Acute posthemorrhagic anemia: Secondary | ICD-10-CM | POA: Diagnosis present

## 2024-01-05 DIAGNOSIS — Z79899 Other long term (current) drug therapy: Secondary | ICD-10-CM | POA: Insufficient documentation

## 2024-01-05 DIAGNOSIS — S88112A Complete traumatic amputation at level between knee and ankle, left lower leg, initial encounter: Secondary | ICD-10-CM | POA: Diagnosis present

## 2024-01-05 DIAGNOSIS — Z8673 Personal history of transient ischemic attack (TIA), and cerebral infarction without residual deficits: Secondary | ICD-10-CM | POA: Insufficient documentation

## 2024-01-05 DIAGNOSIS — F32A Depression, unspecified: Secondary | ICD-10-CM | POA: Insufficient documentation

## 2024-01-05 DIAGNOSIS — R319 Hematuria, unspecified: Principal | ICD-10-CM

## 2024-01-05 DIAGNOSIS — D72829 Elevated white blood cell count, unspecified: Secondary | ICD-10-CM | POA: Diagnosis not present

## 2024-01-05 DIAGNOSIS — I69359 Hemiplegia and hemiparesis following cerebral infarction affecting unspecified side: Secondary | ICD-10-CM

## 2024-01-05 DIAGNOSIS — Z794 Long term (current) use of insulin: Secondary | ICD-10-CM | POA: Insufficient documentation

## 2024-01-05 DIAGNOSIS — I4891 Unspecified atrial fibrillation: Secondary | ICD-10-CM | POA: Insufficient documentation

## 2024-01-05 DIAGNOSIS — E1122 Type 2 diabetes mellitus with diabetic chronic kidney disease: Secondary | ICD-10-CM | POA: Insufficient documentation

## 2024-01-05 DIAGNOSIS — Z7901 Long term (current) use of anticoagulants: Secondary | ICD-10-CM | POA: Diagnosis not present

## 2024-01-05 DIAGNOSIS — L89324 Pressure ulcer of left buttock, stage 4: Secondary | ICD-10-CM | POA: Diagnosis not present

## 2024-01-05 DIAGNOSIS — E871 Hypo-osmolality and hyponatremia: Secondary | ICD-10-CM | POA: Diagnosis not present

## 2024-01-05 DIAGNOSIS — I10 Essential (primary) hypertension: Secondary | ICD-10-CM | POA: Diagnosis present

## 2024-01-05 DIAGNOSIS — Z89512 Acquired absence of left leg below knee: Secondary | ICD-10-CM | POA: Insufficient documentation

## 2024-01-05 DIAGNOSIS — D649 Anemia, unspecified: Secondary | ICD-10-CM | POA: Diagnosis present

## 2024-01-05 DIAGNOSIS — M4628 Osteomyelitis of vertebra, sacral and sacrococcygeal region: Secondary | ICD-10-CM

## 2024-01-05 DIAGNOSIS — E11622 Type 2 diabetes mellitus with other skin ulcer: Secondary | ICD-10-CM

## 2024-01-05 DIAGNOSIS — E119 Type 2 diabetes mellitus without complications: Secondary | ICD-10-CM

## 2024-01-05 DIAGNOSIS — K219 Gastro-esophageal reflux disease without esophagitis: Secondary | ICD-10-CM | POA: Insufficient documentation

## 2024-01-05 DIAGNOSIS — I619 Nontraumatic intracerebral hemorrhage, unspecified: Secondary | ICD-10-CM | POA: Diagnosis present

## 2024-01-05 HISTORY — PX: IR RADIOLOGIST EVAL & MGMT: IMG5224

## 2024-01-05 LAB — CBC
HCT: 26 % — ABNORMAL LOW (ref 39.0–52.0)
HCT: 25.9 % — ABNORMAL LOW (ref 39.0–52.0)
Hemoglobin: 7.8 g/dL — ABNORMAL LOW (ref 13.0–17.0)
Hemoglobin: 7.8 g/dL — ABNORMAL LOW (ref 13.0–17.0)
MCH: 26.5 pg (ref 26.0–34.0)
MCH: 27.2 pg (ref 26.0–34.0)
MCHC: 30 g/dL (ref 30.0–36.0)
MCHC: 30.1 g/dL (ref 30.0–36.0)
MCV: 88.4 fL (ref 80.0–100.0)
MCV: 90.2 fL (ref 80.0–100.0)
Platelets: 355 10*3/uL (ref 150–400)
Platelets: 337 10*3/uL (ref 150–400)
RBC: 2.94 MIL/uL — ABNORMAL LOW (ref 4.22–5.81)
RBC: 2.87 MIL/uL — ABNORMAL LOW (ref 4.22–5.81)
RDW: 18 % — ABNORMAL HIGH (ref 11.5–15.5)
RDW: 18 % — ABNORMAL HIGH (ref 11.5–15.5)
WBC: 16.1 10*3/uL — ABNORMAL HIGH (ref 4.0–10.5)
WBC: 15.3 10*3/uL — ABNORMAL HIGH (ref 4.0–10.5)
nRBC: 0 % (ref 0.0–0.2)
nRBC: 0 % (ref 0.0–0.2)

## 2024-01-05 LAB — COMPREHENSIVE METABOLIC PANEL
ALT: 31 U/L (ref 0–44)
AST: 25 U/L (ref 15–41)
Albumin: 1.6 g/dL — ABNORMAL LOW (ref 3.5–5.0)
Alkaline Phosphatase: 133 U/L — ABNORMAL HIGH (ref 38–126)
Anion gap: 9 (ref 5–15)
BUN: 26 mg/dL — ABNORMAL HIGH (ref 6–20)
CO2: 26 mmol/L (ref 22–32)
Calcium: 9.2 mg/dL (ref 8.9–10.3)
Chloride: 97 mmol/L — ABNORMAL LOW (ref 98–111)
Creatinine, Ser: 2.07 mg/dL — ABNORMAL HIGH (ref 0.61–1.24)
GFR, Estimated: 38 mL/min — ABNORMAL LOW (ref >60)
Glucose, Bld: 70 mg/dL (ref 70–99)
Potassium: 4.5 mmol/L (ref 3.5–5.1)
Sodium: 132 mmol/L — ABNORMAL LOW (ref 135–145)
Total Bilirubin: 0.6 mg/dL (ref 0.0–1.2)
Total Protein: 6.3 g/dL — ABNORMAL LOW (ref 6.5–8.1)

## 2024-01-05 MED ORDER — LOSARTAN POTASSIUM 50 MG PO TABS
50.0000 mg | ORAL_TABLET | Freq: Every day | ORAL | Status: DC
  Administered 2024-01-07 – 2024-01-08 (×2): 50 mg via ORAL
  Filled 2024-01-05 (×2): qty 1

## 2024-01-05 MED ORDER — AMIODARONE HCL 200 MG PO TABS
100.0000 mg | ORAL_TABLET | Freq: Every day | ORAL | Status: DC
  Administered 2024-01-06 – 2024-01-08 (×3): 100 mg via ORAL
  Filled 2024-01-05 (×3): qty 1

## 2024-01-05 MED ORDER — CARVEDILOL 25 MG PO TABS
25.0000 mg | ORAL_TABLET | Freq: Two times a day (BID) | ORAL | Status: DC
  Administered 2024-01-07 (×2): 25 mg via ORAL
  Filled 2024-01-05 (×2): qty 1
  Filled 2024-01-05: qty 2

## 2024-01-05 MED ORDER — TERAZOSIN HCL 1 MG PO CAPS
1.0000 mg | ORAL_CAPSULE | Freq: Every day | ORAL | Status: DC
  Administered 2024-01-06 – 2024-01-07 (×3): 1 mg via ORAL
  Filled 2024-01-05 (×4): qty 1

## 2024-01-05 MED ORDER — FUROSEMIDE 40 MG PO TABS
40.0000 mg | ORAL_TABLET | Freq: Every day | ORAL | Status: DC
  Administered 2024-01-06 – 2024-01-08 (×3): 40 mg via ORAL
  Filled 2024-01-05 (×2): qty 1
  Filled 2024-01-05: qty 2

## 2024-01-05 MED ORDER — FINASTERIDE 5 MG PO TABS
5.0000 mg | ORAL_TABLET | Freq: Every day | ORAL | Status: DC
  Administered 2024-01-06 – 2024-01-08 (×3): 5 mg via ORAL
  Filled 2024-01-05 (×3): qty 1

## 2024-01-05 MED ORDER — MELATONIN 5 MG PO TABS: 5.0000 mg | ORAL_TABLET | Freq: Every evening | ORAL | Status: DC | PRN

## 2024-01-05 MED ORDER — METHOCARBAMOL 500 MG PO TABS
500.0000 mg | ORAL_TABLET | Freq: Two times a day (BID) | ORAL | Status: DC
  Administered 2024-01-06 – 2024-01-08 (×5): 500 mg via ORAL
  Filled 2024-01-05 (×5): qty 1

## 2024-01-05 MED ORDER — INSULIN GLARGINE 100 UNIT/ML ~~LOC~~ SOLN
5.0000 [IU] | Freq: Every day | SUBCUTANEOUS | Status: DC
  Administered 2024-01-06 – 2024-01-08 (×3): 5 [IU] via SUBCUTANEOUS
  Filled 2024-01-05 (×3): qty 0.05

## 2024-01-05 MED ORDER — AMLODIPINE BESYLATE 10 MG PO TABS
10.0000 mg | ORAL_TABLET | Freq: Every day | ORAL | Status: DC
  Administered 2024-01-07 – 2024-01-08 (×2): 10 mg via ORAL
  Filled 2024-01-05 (×2): qty 1

## 2024-01-05 MED ORDER — RENA-VITE PO TABS
1.0000 | ORAL_TABLET | Freq: Every day | ORAL | Status: DC
  Administered 2024-01-06 – 2024-01-07 (×3): 1 via ORAL
  Filled 2024-01-05 (×3): qty 1

## 2024-01-05 MED ORDER — ATORVASTATIN CALCIUM 40 MG PO TABS
40.0000 mg | ORAL_TABLET | Freq: Every day | ORAL | Status: DC
  Administered 2024-01-06 – 2024-01-07 (×3): 40 mg via ORAL
  Filled 2024-01-05 (×3): qty 1

## 2024-01-05 MED ORDER — HYDRALAZINE HCL 25 MG PO TABS
25.0000 mg | ORAL_TABLET | Freq: Three times a day (TID) | ORAL | Status: DC
  Administered 2024-01-06 – 2024-01-07 (×5): 25 mg via ORAL
  Filled 2024-01-05 (×6): qty 1

## 2024-01-05 MED ORDER — POLYETHYLENE GLYCOL 3350 17 G PO PACK: 17.0000 g | PACK | Freq: Every day | ORAL | Status: DC | PRN

## 2024-01-05 MED ORDER — CLONIDINE HCL 0.1 MG PO TABS
0.1000 mg | ORAL_TABLET | Freq: Every day | ORAL | Status: DC
  Administered 2024-01-07 – 2024-01-08 (×2): 0.1 mg via ORAL
  Filled 2024-01-05 (×2): qty 1

## 2024-01-05 MED ORDER — PANTOPRAZOLE SODIUM 40 MG PO TBEC
40.0000 mg | DELAYED_RELEASE_TABLET | Freq: Every day | ORAL | Status: DC
  Administered 2024-01-06 – 2024-01-08 (×3): 40 mg via ORAL
  Filled 2024-01-05 (×3): qty 1

## 2024-01-05 MED ORDER — SENNOSIDES-DOCUSATE SODIUM 8.6-50 MG PO TABS: 2.0000 | ORAL_TABLET | Freq: Two times a day (BID) | ORAL | Status: DC | PRN

## 2024-01-05 MED ORDER — INSULIN ASPART 100 UNIT/ML IJ SOLN: 0.0000 [IU] | Freq: Three times a day (TID) | INTRAMUSCULAR | Status: DC

## 2024-01-05 MED ORDER — VITAMIN C 500 MG PO TABS
500.0000 mg | ORAL_TABLET | Freq: Two times a day (BID) | ORAL | Status: DC
  Administered 2024-01-06 – 2024-01-08 (×6): 500 mg via ORAL
  Filled 2024-01-05 (×6): qty 1

## 2024-01-05 MED ORDER — PAROXETINE HCL 10 MG PO TABS
10.0000 mg | ORAL_TABLET | Freq: Every day | ORAL | Status: DC
  Administered 2024-01-06 – 2024-01-08 (×3): 10 mg via ORAL
  Filled 2024-01-05 (×3): qty 1

## 2024-01-05 MED ORDER — LEVETIRACETAM 500 MG PO TABS
1500.0000 mg | ORAL_TABLET | Freq: Two times a day (BID) | ORAL | Status: DC
  Administered 2024-01-06 – 2024-01-08 (×6): 1500 mg via ORAL
  Filled 2024-01-05 (×3): qty 3
  Filled 2024-01-05: qty 6
  Filled 2024-01-05 (×2): qty 3

## 2024-01-05 NOTE — H&P (Signed)
 History and Physical    Bruce Little JXB:[id number] DOB: [birth year] DOA: 01/05/2024  Patient coming from: Home.  Chief Complaint: Hematuria.  HPI: Bruce Little is a 53 y.o. male with past medical history of intracranial bleed status post craniectomy in [place] in June 2024, ESRD on hemodialysis Tuesday Thursdays Saturday, paroxysmal atrial fibrillation no longer on anticoagulation takes amiodarone, hypertension, diabetes mellitus type 2, chronic anemia who has had a chronic indwelling Foley catheter which was changed to suprapubic catheter on 01/03/2024 and since then has been having persistent hematuria and presents to the ER.  Patient is only on baby aspirin not on any other anticoagulants.  Denies any fever chills.  Had followed up with wound care this morning.  Patient was admitted on 07/31/2024 for anemia and was transfused 1 unit of PRBC.  ED Course: In the ER patient's labs show hemoglobin of 7.8 which is dropped from 9.3 three days ago.  Urologist Dr. Laverle Patter was consulted.  Plan is to admit and observe and transfuse if there is any further drop in hemoglobin.  Review of Systems: As per HPI, rest all negative.   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   COVID    has had it 2 times, one mild and one wasn't   Depression    DM2 (diabetes mellitus, type 2) (HCC)    Family history of adverse reaction to anesthesia    Dad has a "hard time waking up" after anesthesia   GERD (gastroesophageal reflux disease)    Hemorrhagic stroke (HCC) 04/30/2023   s/p right decompressive craniectomy and evacuation of hematoma on 04/30/2023   History of blood transfusion    HTN (hypertension)    Osteomyelitis of fifth toe of left foot (HCC) 08/13/2021   Osteomyelitis of fourth toe of left foot (HCC) 08/13/2021   Pneumonia    Sacral decubitus ulcer 05/2023   Stroke (HCC) 05/19/2021    unable to move left side    Past Surgical History:  Procedure Laterality Date   AMPUTATION Left 08/29/2021   Procedure: AMPUTATION OF FOURTH TOE AND RAY ALONG WITH REMAINING FITH METATARSAL;  Surgeon: Tarry Kos, MD;  Location: [facility] OR;  Service: Orthopedics;  Laterality: Left;   AMPUTATION Left 09/03/2021   Procedure: LISFRANC AMPUTATION;  Surgeon: Tarry Kos, MD;  Location: [facility] OR;  Service: Orthopedics;  Laterality: Left;   AMPUTATION Left 10/29/2021   Procedure: AMPUTATION BELOW KNEE -LEFT;  Surgeon: Tarry Kos, MD;  Location: [facility] OR;  Service: Orthopedics;  Laterality: Left;   APPLICATION OF WOUND VAC Left 07/07/2021   Procedure: APPLICATION OF WOUND VAC;  Surgeon: Tarry Kos, MD;  Location: [facility] OR;  Service: Orthopedics;  Laterality: Left;   APPLICATION OF WOUND VAC Left 08/29/2021   Procedure: APPLICATION OF WOUND VAC;  Surgeon: Tarry Kos, MD;  Location: [facility] OR;  Service: Orthopedics;  Laterality: Left;   DIALYSIS/PERMA CATHETER INSERTION N/A 12/28/2023   Procedure: DIALYSIS/PERMA CATHETER INSERTION;  Surgeon: Dagoberto Ligas, MD;  Location: [facility] INVASIVE CV LAB;  Service: Cardiovascular;  Laterality: N/A;   DIALYSIS/PERMA CATHETER REMOVAL N/A 12/28/2023   Procedure: DIALYSIS/PERMA CATHETER REMOVAL;  Surgeon: Dagoberto Ligas, MD;  Location: [facility] INVASIVE CV LAB;  Service: Cardiovascular;  Laterality: N/A;   I & D EXTREMITY Left 07/03/2021   Procedure: IRRIGATION AND DEBRIDEMENT ,FIFTH  RAY  AMPUTATION LEFT FOOT, , WOUND VAC PLACEMENT;  Surgeon: Tarry Kos, MD;  Location: [facility];  Service: Orthopedics;  Laterality: Left;   I & D EXTREMITY Left 07/07/2021   Procedure: IRRIGATION AND DEBRIDEMENT LEFT FOOT;  Surgeon: Tarry Kos, MD;  Location: [facility];  Service: Orthopedics;  Laterality: Left;   I & D EXTREMITY Left 08/29/2021   Procedure: IRRIGATION AND DEBRIDEMENT LEFT FOOT;  Surgeon: Tarry Kos, MD;  Location: [facility];  Service: Orthopedics;  Laterality: Left;   IR FLUORO GUIDE CV  LINE RIGHT  07/09/2021   IR RADIOLOGIST EVAL & MGMT  01/05/2024   IR REMOVAL TUN CV CATH W/O FL  10/08/2021   IR REMOVAL TUN CV CATH W/O FL  07/13/2023   IR REPLACE G-TUBE SIMPLE WO FLUORO  09/20/2023   IR US GUIDE VASC ACCESS RIGHT  07/09/2021   TRACHEOSTOMY     05/14/2023 - 07/23/2023   VITRECTOMY Left    [place] [facility]     reports that he has never smoked. He has never used smokeless tobacco. He reports that he does not currently use alcohol. He reports that he does not use drugs.  No Known Allergies  Family History  Problem Relation Age of Onset   Stroke Mother    Cancer Mother    Heart disease Father     Prior to Admission medications   Medication Sig Start Date End Date Taking? Authorizing Provider  acetaminophen (TYLENOL) 500 MG tablet Take 1,000 mg by mouth every 6 (six) hours as needed for mild pain (pain score 1-3) or headache.   Yes [provider]  amiodarone (PACERONE) 100 MG tablet Take 100 mg by mouth daily.   Yes [provider]  amLODipine (NORVASC) 10 MG tablet Take 10 mg by mouth daily.   Yes [provider]  ascorbic acid (VITAMIN C) 500 MG tablet Take 1 tablet (500 mg total) by mouth 2 (two) times daily. 10/02/23  Yes [name], MD  ASHWAGANDHA PO Take 1 Dose by mouth at bedtime. 1 dose- 2 gummies   Yes [provider]  aspirin EC 81 MG tablet Take 81 mg by mouth at bedtime. Swallow whole.   Yes [provider]  atorvastatin (LIPITOR) 40 MG tablet Take 40 mg by mouth at bedtime.   Yes [provider]  Calcium Carb-Cholecalciferol (CALCIUM 500 + D3 PO) Take 1 tablet by mouth daily.   Yes [provider]  carvedilol (COREG) 25 MG tablet Take 1 tablet (25 mg total) by mouth 2 (two) times daily with a meal. 10/02/23  Yes [name], MD  cloNIDine (CATAPRES) 0.1 MG tablet Take 1 tablet (0.1 mg total) by mouth daily. 10/02/23  Yes [name], MD  diphenhydramine-acetaminophen  (TYLENOL PM) 25-500 MG TABS tablet Take 2 tablets by mouth at bedtime as needed.   Yes [provider]  finasteride (PROSCAR) 5 MG tablet Take 5 mg by mouth daily.   Yes [provider]  furosemide (LASIX) 40 MG tablet Take 40 mg by mouth daily.   Yes [provider]  glipiZIDE (GLUCOTROL) 5 MG tablet Take 1 tablet (5 mg total) by mouth daily. 05/21/21 05/08/24 Yes Sheikh, Omair Latif, DO  hydrALAZINE (APRESOLINE) 25 MG tablet Take 25 mg by mouth 3 (three) times daily.   Yes [provider]  LANTUS SOLOSTAR 100 UNIT/ML Solostar Pen Inject 5 Units into the skin daily. Patient taking differently: Inject 5 Units into the skin  at bedtime. 10/02/23  Yes [name], MD  levETIRAcetam (KEPPRA) 750 MG tablet Take 1,500 mg by mouth 2 (two) times daily.   Yes [provider]  losartan (COZAAR) 50 MG tablet Take 1 tablet (50 mg total) by mouth daily. 10/02/23  Yes [name], MD  melatonin 5 MG TABS Take 5-10 mg by mouth at bedtime as needed (sleep).   Yes [provider]  methocarbamol (ROBAXIN) 500 MG tablet Take 1 tablet (500 mg total) by mouth every 8 (eight) hours as needed for muscle spasms. Patient taking differently: Take 500 mg by mouth 2 (two) times daily. May take a third 500 mg dose midday as needed for muscle spasms 10/02/23  Yes [name], MD  multivitamin (RENA-VIT) TABS tablet Take 1 tablet by mouth at bedtime. 10/02/23  Yes [name], MD  mupirocin ointment (BACTROBAN) 2 % Apply 1 Application topically 2 (two) times daily. 12/24/23  Yes [provider]  NATURAL VITAMIN A PO Take 3,000 mcg by mouth daily.   Yes [provider]  ondansetron (ZOFRAN-ODT) 4 MG disintegrating tablet Take 4 mg by mouth every 8 (eight) hours as needed for nausea or vomiting.   Yes [provider]  pantoprazole (PROTONIX) 40 MG tablet Take 1 tablet (40 mg total) by mouth daily. 05/22/21  Yes Sheikh,  Omair Latif, DO  PARoxetine (PAXIL) 10 MG tablet Take 10 mg by mouth daily.   Yes [provider]  polyethylene glycol (MIRALAX / GLYCOLAX) 17 g packet Take 17 g by mouth 2 (two) times daily. Patient taking differently: Take 17 g by mouth daily as needed for moderate constipation. 11/03/23  Yes Osvaldo Shipper, MD  promethazine (PHENERGAN) 12.5 MG tablet Take 1 tablet (12.5 mg total) by mouth every 6 (six) hours as needed for nausea or vomiting. 11/03/23  Yes Osvaldo Shipper, MD  senna-docusate (SENOKOT-S) 8.6-50 MG tablet Take 2 tablets by mouth 2 (two) times daily. Patient taking differently: Take 2 tablets by mouth 2 (two) times daily as needed for mild constipation. 11/03/23  Yes Osvaldo Shipper, MD  terazosin (HYTRIN) 1 MG capsule Take 1 mg by mouth at bedtime.   Yes [provider]  Vitamin D, Ergocalciferol, (DRISDOL) 1.25 MG (50000 UNIT) CAPS capsule Take 1 capsule (50,000 Units total) by mouth every 7 (seven) days. 10/03/23  Yes [name], MD  doxycycline (VIBRAMYCIN) 100 MG capsule Take 100 mg by mouth 2 (two) times daily. Patient not taking: Reported on 01/01/2024 12/24/23   [provider]    Physical Exam: Constitutional: Moderately built and nourished. Vitals:   01/05/24 1617 01/05/24 1644  BP: 116/66   Pulse: 77   Resp: 15   Temp: 98.5 F (36.9 C)   SpO2: 100%   Weight:  78 kg  Height:  6\' 2"  (1.88 m)   Eyes: Anicteric no pallor. ENMT: No discharge from the ears eyes nose or mouth. Neck: No mass felt.  No neck rigidity. Respiratory: No rhonchi or crepitations. Cardiovascular: S1-S2 heard. Abdomen: Soft nontender bowel sounds present.  PEG tube. Musculoskeletal: Left BKA. Skin: Sacral decubitus. Neurologic: Alert awake oriented to his name and place. Psychiatric: Oriented to name and place.   Labs on Admission: I have personally reviewed following labs and imaging studies  CBC: Recent Labs  Lab 12/31/23 1748 01/01/24 0850  01/03/24 0714 01/05/24 1417 01/05/24 1701  WBC 18.7* 13.2* 15.5* 16.1* 15.3*  HGB 7.1* 7.9* 9.3* 7.8* 7.8*  HCT 24.2* 25.6* 30.8* 26.0*  25.9*  MCV 91.0 88.3 88.3 88.4 90.2  PLT 424* 369 290 355 337   Basic Metabolic Panel: Recent Labs  Lab 12/31/23 1748 01/01/24 0850 01/05/24 1701  NA 134* 133* 132*  K 4.4 4.4 4.5  CL 98 97* 97*  CO2 25 25 26   GLUCOSE 117* 101* 70  BUN 44* 54* 26*  CREATININE 2.10* 2.44* 2.07*  CALCIUM 9.0 8.8* 9.2   GFR: Estimated Creatinine Clearance: 46.6 mL/min (A) (by C-G formula based on SCr of 2.07 mg/dL (H)). Liver Function Tests: Recent Labs  Lab 12/31/23 1748 01/05/24 1701  AST 31 25  ALT 42 31  ALKPHOS 142* 133*  BILITOT 0.5 0.6  PROT 6.4* 6.3*  ALBUMIN 1.6* 1.6*   No results for input(s): "LIPASE", "AMYLASE" in the last 168 hours. No results for input(s): "AMMONIA" in the last 168 hours. Coagulation Profile: No results for input(s): "INR", "PROTIME" in the last 168 hours. Cardiac Enzymes: No results for input(s): "CKTOTAL", "CKMB", "CKMBINDEX", "TROPONINI" in the last 168 hours. BNP (last 3 results) No results for input(s): "PROBNP" in the last 8760 hours. HbA1C: No results for input(s): "HGBA1C" in the last 72 hours. CBG: Recent Labs  Lab 01/03/24 0710 01/03/24 0945  GLUCAP 73 74   Lipid Profile: No results for input(s): "CHOL", "HDL", "LDLCALC", "TRIG", "CHOLHDL", "LDLDIRECT" in the last 72 hours. Thyroid Function Tests: No results for input(s): "TSH", "T4TOTAL", "FREET4", "T3FREE", "THYROIDAB" in the last 72 hours. Anemia Panel: No results for input(s): "VITAMINB12", "FOLATE", "FERRITIN", "TIBC", "IRON", "RETICCTPCT" in the last 72 hours. Urine analysis:    Component Value Date/Time   COLORURINE AMBER (A) 11/01/2023 0806   APPEARANCEUR CLOUDY (A) 11/01/2023 0806   LABSPEC 1.016 11/01/2023 0806   PHURINE 6.0 11/01/2023 0806   GLUCOSEU NEGATIVE 11/01/2023 0806   HGBUR NEGATIVE 11/01/2023 0806   BILIRUBINUR NEGATIVE  11/01/2023 0806   KETONESUR NEGATIVE 11/01/2023 0806   PROTEINUR >=300 (A) 11/01/2023 0806   NITRITE NEGATIVE 11/01/2023 0806   LEUKOCYTESUR LARGE (A) 11/01/2023 0806   Sepsis Labs: @LABRCNTIP (procalcitonin:4,lacticidven:4) )No results found for this or any previous visit (from the past 240 hours).   Radiological Exams on Admission: IR Radiologist Eval & Mgmt Result Date: 01/05/2024 EXAM: NEW PATIENT OFFICE VISIT CHIEF COMPLAINT: Electronic medical record HISTORY OF PRESENT ILLNESS: Electronic medical record REVIEW OF SYSTEMS: Electronic medical record PHYSICAL EXAMINATION: Electronic medical record ASSESSMENT AND PLAN: Electronic medical record Electronically Signed   By: Gilmer Mor D.O.   On: 01/05/2024 16:20      Assessment/Plan Principal Problem:   Hematuria Active Problems:   DM2 (diabetes mellitus, type 2) (HCC)   HTN (hypertension)   Paroxysmal A-fib (HCC)   Below-knee amputation of left lower extremity (HCC)   Sacral decubitus ulcer   ICH (intracerebral hemorrhage) (HCC)   Anemia   ESRD (end stage renal disease) on dialysis (HCC)    Hematuria with recent suprapubic catheter placement on 08/02/2024.  Urologist Dr. Laverle Patter has been consulted.  Plan is to transfuse PRBC if there is further decline in hemoglobin.  Holding aspirin for now patient's wife agreeable. History of intracranial bleed status post craniectomy presently on Keppra.  Patient also has a PEG tube but is able to or tolerate oral diet. ESRD on hemodialysis Tuesday Thursdays and Saturday.  Consult nephrology. Acute blood loss anemia follow CBC.  See #1. Paroxysmal atrial fibrillation on amiodarone and beta-blockers.  Prior history of intracranial bleed not on any anticoagulation. Hypertension on amlodipine Coreg hydralazine clonidine ARB and Lasix. Sacral decubitus  ulcer has followed up with wound team this morning uses wound VAC 3 times a week and also Vashe wet-to-dry dressing. Diabetes mellitus type 2 on  Lantus insulin 5 units in the morning and glipizide.  Presently on sliding scale coverage.  Last hemoglobin A1c was 5.1 on 09/18/2023. Leukocytosis appears to be chronic.  No definite signs of any infection at this time.  Since patient has persistent hematuria will need close observation and more than 2 midnight stay.   DVT prophylaxis: SCDs. Code Status: Full code. Family Communication: Patient's wife. Disposition Plan: Medical floor. Consults called: Urology. Admission status: Observation.

## 2024-01-05 NOTE — ED Triage Notes (Signed)
 The pt had  a supre pubic cath inserted on Monday   last pm he began to have blood coming from the tube  today his hgb is low   requiring a transfusion

## 2024-01-05 NOTE — Progress Notes (Addendum)
 Chief Complaint: Patient was seen in consultation today for Hematuria  at the request of Bruce Little,Bruce Little  Referring Physician(s): Bruce Little  Supervising Physician: Bruce Little  Patient Status: [facility] - Out-pt  History of Present Illness: Bruce Little is a 53 y.o. 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 Patient underwent a suprapubic catheter placement 01/03/24 due to urinary retention. Patient's wife Bruce Little contacted the IR department 01/05/24 due to concerns for bleeding. Patient's wife reports that there has been "a lot" of dark red blood coming from the suprapubic tube. This started yesterday. Patient today reports some dizziness. He did have a blood transfusion this past weekend for anemia. His HGB 2/21 was 7.1 and 7.9 7/22. Repeat HGB 2/24 was 9.3. Patient has not experienced pain, fever, nausea, vomiting, and/or diarrhea.  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   COVID    has had it 2 times, one mild and one wasn't   Depression    DM2 (diabetes mellitus, type 2) (HCC)    Family history of adverse reaction to anesthesia    Dad has a "hard time waking up" after anesthesia   GERD (gastroesophageal reflux disease)    Hemorrhagic stroke (HCC) 04/30/2023   s/p right decompressive craniectomy and evacuation of hematoma on 04/30/2023   History of blood transfusion    HTN (hypertension)    Osteomyelitis of fifth toe of left foot (HCC) 08/13/2021   Osteomyelitis of fourth toe of left foot (HCC) 08/13/2021   Pneumonia    Sacral decubitus ulcer 05/2023   Stroke (HCC) 05/19/2021   unable to move left side    Past Surgical History:  Procedure Laterality Date   AMPUTATION Left 08/29/2021   Procedure: AMPUTATION OF FOURTH TOE AND RAY ALONG WITH REMAINING FITH  METATARSAL;  Surgeon: Bruce Kos, MD;  Location: [facility] OR;  Service: Orthopedics;  Laterality: Left;   AMPUTATION Left 09/03/2021   Procedure: LISFRANC AMPUTATION;  Surgeon: Bruce Kos, MD;  Location: [facility] OR;  Service: Orthopedics;  Laterality: Left;   AMPUTATION Left 10/29/2021   Procedure: AMPUTATION BELOW KNEE -LEFT;  Surgeon: Bruce Kos, MD;  Location: [facility] OR;  Service: Orthopedics;  Laterality: Left;   APPLICATION OF WOUND VAC Left 07/07/2021   Procedure: APPLICATION OF WOUND VAC;  Surgeon: Bruce Kos, MD;  Location: [facility] OR;  Service: Orthopedics;  Laterality: Left;   APPLICATION OF WOUND VAC Left 08/29/2021   Procedure: APPLICATION OF WOUND VAC;  Surgeon: Bruce Kos, MD;  Location: [facility] OR;  Service: Orthopedics;  Laterality: Left;   DIALYSIS/PERMA CATHETER INSERTION N/A 12/28/2023   Procedure: DIALYSIS/PERMA CATHETER INSERTION;  Surgeon: Bruce Ligas, MD;  Location: [facility] INVASIVE CV LAB;  Service: Cardiovascular;  Laterality: N/A;   DIALYSIS/PERMA CATHETER REMOVAL N/A 12/28/2023   Procedure: DIALYSIS/PERMA CATHETER REMOVAL;  Surgeon: Bruce Ligas, MD;  Location: [facility] INVASIVE CV LAB;  Service: Cardiovascular;  Laterality: N/A;   I & D EXTREMITY Left 07/03/2021   Procedure: IRRIGATION AND DEBRIDEMENT ,FIFTH RAY  AMPUTATION LEFT FOOT, , WOUND VAC PLACEMENT;  Surgeon: Bruce Kos, MD;  Location: [facility] OR;  Service: Orthopedics;  Laterality: Left;   I & D EXTREMITY Left 07/07/2021   Procedure: IRRIGATION AND DEBRIDEMENT LEFT FOOT;  Surgeon: Bruce Little,  Bruce Cap, MD;  Location: [facility] OR;  Service: Orthopedics;  Laterality: Left;   I & D EXTREMITY Left 08/29/2021   Procedure: IRRIGATION AND DEBRIDEMENT LEFT FOOT;  Surgeon: Bruce Kos, MD;  Location: [facility] OR;  Service: Orthopedics;  Laterality: Left;   IR FLUORO GUIDE CV LINE RIGHT  07/09/2021   IR REMOVAL TUN CV CATH W/O FL  10/08/2021   IR REMOVAL TUN CV CATH W/O FL  07/13/2023   IR REPLACE G-TUBE SIMPLE WO FLUORO  09/20/2023   IR US GUIDE VASC  ACCESS RIGHT  07/09/2021   TRACHEOSTOMY     05/14/2023 - 07/23/2023   VITRECTOMY Left    [facility]    Allergies: Patient has no known allergies.  Medications: Prior to Admission medications   Medication Sig Start Date End Date Taking? Authorizing Provider  acetaminophen (TYLENOL) 500 MG tablet Take 1,000 mg by mouth every 6 (six) hours as needed for mild pain (pain score 1-3) or headache.    [provider]  amiodarone (PACERONE) 100 MG tablet Take 100 mg by mouth daily.    [provider]  amLODipine (NORVASC) 10 MG tablet Take 10 mg by mouth daily.    [provider]  ascorbic acid (VITAMIN C) 500 MG tablet Take 1 tablet (500 mg total) by mouth 2 (two) times daily. 10/02/23   Bruce Fallen, MD  ASHWAGANDHA PO Take 1 Dose by mouth at bedtime. 1 dose- 2 gummies    [provider]  aspirin EC 81 MG tablet Take 81 mg by mouth at bedtime. Swallow whole.    [provider]  atorvastatin (LIPITOR) 40 MG tablet Take 40 mg by mouth at bedtime.    [provider]  Calcium Carb-Cholecalciferol (CALCIUM 500 + D3 PO) Take 1 tablet by mouth daily.    [provider]  carvedilol (COREG) 25 MG tablet Take 1 tablet (25 mg total) by mouth 2 (two) times daily with a meal. 10/02/23   Bruce Fallen, MD  cloNIDine (CATAPRES) 0.1 MG tablet Take 1 tablet (0.1 mg total) by mouth daily. 10/02/23   Bruce Fallen, MD  diphenhydramine-acetaminophen (TYLENOL PM) 25-500 MG TABS tablet Take 2 tablets by mouth at bedtime as needed.    [provider]  doxycycline (VIBRAMYCIN) 100 MG capsule Take 100 mg by mouth 2 (two) times daily. Patient not taking: Reported on 01/01/2024 12/24/23   [provider]  finasteride (PROSCAR) 5 MG tablet Take 5 mg by mouth daily.    [provider]  furosemide (LASIX) 40 MG tablet Take 40 mg by mouth daily.    [provider]  glipiZIDE (GLUCOTROL) 5 MG tablet Take 1  tablet (5 mg total) by mouth daily. 05/21/21 05/08/24  Bruce Merles Latif, DO  hydrALAZINE (APRESOLINE) 25 MG tablet Take 25 mg by mouth 3 (three) times daily.    [provider]  LANTUS SOLOSTAR 100 UNIT/ML Solostar Pen Inject 5 Units into the skin daily. Patient taking differently: Inject 5 Units into the skin at bedtime. 10/02/23   Bruce Fallen, MD  levETIRAcetam (KEPPRA) 750 MG tablet Take 1,500 mg by mouth 2 (two) times daily.    [provider]  losartan (COZAAR) 50 MG tablet Take 1 tablet (50 mg total) by mouth daily. 10/02/23   Bruce Fallen, MD  melatonin 5 MG TABS Take 5-10 mg by mouth at bedtime as needed (sleep).    [provider]  methocarbamol (ROBAXIN) 500 MG tablet Take  1 tablet (500 mg total) by mouth every 8 (eight) hours as needed for muscle spasms. Patient taking differently: Take 500 mg by mouth 2 (two) times daily. May take a third 500 mg dose midday as needed for muscle spasms 10/02/23   Bruce Fallen, MD  multivitamin (RENA-VIT) TABS tablet Take 1 tablet by mouth at bedtime. 10/02/23   Bruce Fallen, MD  mupirocin ointment (BACTROBAN) 2 % Apply 1 Application topically 2 (two) times daily. 12/24/23   [provider]  NATURAL VITAMIN A PO Take 3,000 mcg by mouth daily.    [provider]  ondansetron (ZOFRAN-ODT) 4 MG disintegrating tablet Take 4 mg by mouth every 8 (eight) hours as needed for nausea or vomiting.    [provider]  pantoprazole (PROTONIX) 40 MG tablet Take 1 tablet (40 mg total) by mouth daily. 05/22/21   Bruce Merles Latif, DO  PARoxetine (PAXIL) 10 MG tablet Take 10 mg by mouth daily.    [provider]  polyethylene glycol (MIRALAX / GLYCOLAX) 17 g packet Take 17 g by mouth 2 (two) times daily. Patient taking differently: Take 17 g by mouth daily as needed for moderate constipation. 11/03/23   Bruce Shipper, MD  promethazine (PHENERGAN) 12.5 MG tablet Take 1 tablet  (12.5 mg total) by mouth every 6 (six) hours as needed for nausea or vomiting. 11/03/23   Bruce Shipper, MD  senna-docusate (SENOKOT-S) 8.6-50 MG tablet Take 2 tablets by mouth 2 (two) times daily. Patient taking differently: Take 2 tablets by mouth 2 (two) times daily as needed for mild constipation. 11/03/23   Bruce Shipper, MD  terazosin (HYTRIN) 1 MG capsule Take 1 mg by mouth at bedtime.    [provider]  Vitamin D, Ergocalciferol, (DRISDOL) 1.25 MG (50000 UNIT) CAPS capsule Take 1 capsule (50,000 Units total) by mouth every 7 (seven) days. 10/03/23   Bruce Fallen, MD     Family History  Problem Relation Age of Onset   Stroke Mother    Cancer Mother    Heart disease Father     Social History   Socioeconomic History   Marital status: Married    Spouse name: Bruce Little   Number of children: Not on file   Years of education: Not on file   Highest education level: Not on file  Occupational History   Not on file  Tobacco Use   Smoking status: Never   Smokeless tobacco: Never  Vaping Use   Vaping status: Never Used  Substance and Sexual Activity   Alcohol use: Not Currently   Drug use: Never   Sexual activity: Not on file  Other Topics Concern   Not on file  Social History Narrative   Not on file   Social Drivers of Health   Financial Resource Strain: Low Risk  (08/10/2023)   Received from [facility]   Overall Financial Resource Strain (CARDIA)    Difficulty of Paying Living Expenses: Not hard at all  Food Insecurity: No Food Insecurity (11/01/2023)   Hunger Vital Sign    Worried About Running Out of Food in the Last Year: Never true    Ran Out of Food in the Last Year: Never true  Transportation Needs: No Transportation Needs (11/01/2023)   PRAPARE - Administrator, Civil Service (Medical): No    Lack of Transportation (Non-Medical): No  Physical Activity: Not on file  Stress: No Stress Concern Present (08/10/2023)   Received from  [facility]  [employer] of Occupational Health - Occupational Stress Questionnaire    Feeling of Stress : Not at all  Social Connections: Unknown (08/13/2023)   Received from [employer]   Social Network    Social Network: Not on file    Review of Systems: A 12 point ROS discussed and pertinent positives are indicated in the HPI above.  All other systems are negative.  Review of Systems  Constitutional:  Negative for fatigue and fever.  Respiratory:  Negative for shortness of breath.   Cardiovascular:  Negative for chest pain.  Gastrointestinal:  Negative for abdominal pain.  Genitourinary:  Positive for hematuria (in suprapubic catheter bag).       Suprapubic tube in place  Neurological:  Positive for dizziness.    Vital Signs: BP 119/71 (BP Location: Right Arm)   Pulse 74   Resp 18   SpO2 99%     Physical Exam Constitutional:      Comments: Wheelchair bound  HENT:     Head: Normocephalic and atraumatic.  Cardiovascular:     Pulses: Normal pulses.  Abdominal:     General: Abdomen is flat.     Palpations: Abdomen is soft.     Tenderness: There is no abdominal tenderness. There is no guarding.     Comments: Suprapubic catheter in place. Insertion site is without erythema, edema, drainage or ecchymosis. A clean/dry bandage overlies the insertion site. There is a moderate amount of blood present in the gravity bag.  Neurological:     Motor: Weakness (left side) present.     Comments: Alert, but main history comes from the patient's wife Bruce Little.     Imaging: CT GUIDED SUPRAPUBIC CATHETER PLMT Result Date: 01/03/2024 INDICATION: Urinary retention with a Foley catheter. Request for suprapubic catheter placement. EXAM: CT-GUIDED SUPRAPUBIC CATHETER PLACEMENT MEDICATIONS: Rocephin 2 g; The antibiotic was administered in an appropriate time frame prior to skin puncture. ANESTHESIA/SEDATION: Moderate (conscious) sedation was employed during this procedure. A total  of Versed 2.0mg  and fentanyl 75 mcg was administered intravenously at the order of the provider performing the procedure. Total intra-service moderate sedation time: 22 minutes. Patient's level of consciousness and vital signs were monitored continuously by radiology nurse throughout the procedure under the supervision of the provider performing the procedure. CONTRAST:  None FLUOROSCOPY TIME:  None COMPLICATIONS: None immediate. PROCEDURE: The procedure was explained to the patient. The risks and benefits of the procedure were discussed and the patient's questions were addressed. Informed consent was obtained from the patient. Patient was placed supine on the CT scanner. Images were obtained of the pelvis. Fluid was instilled in the urinary bladder through the existing Foley catheter. Additional CT images of the pelvis were obtained. Anterior pelvis was prepped with chlorhexidine and sterile field was created. Maximal barrier sterile technique was utilized including caps, mask, sterile gowns, sterile gloves, sterile drape, hand hygiene and skin antiseptic. Skin was anesthetized with 1% lidocaine. A small incision was made. Using CT guidance, an 18 gauge trocar needle was directed into the bladder. Urine was aspirated. Superstiff Amplatz wire was placed. The tract was dilated to accommodate a 16 [place] multipurpose drain. Additional urine was aspirated. Follow up CT images confirmed placement in the urinary bladder. Suprapubic catheter was sutured to skin and attached to a gravity bag. Dressing was placed. Foley catheter was removed at the end of the procedure. FINDINGS: Urinary bladder was adequately distended after 150 mL of saline was instilled through the Foley catheter. Suprapubic catheter confirmed within the  urinary bladder at the end of the procedure. IMPRESSION: CT-guided placement of suprapubic catheter. Electronically Signed   By: Bruce Little M.D.   On: 01/03/2024 10:24   PERIPHERAL VASCULAR  CATHETERIZATION Result Date: 12/28/2023 Patient presents with poor flows in his right IJ tunneled hemodialysis catheter (19 cm cuff to tip- Palindrome) that was placed 5 months ago. On examination, aspiration from both ports is good however flushing from the arterial port is sluggish. Chest x-ray confirms the catheter tip is a little higher positioned in the SVC. The catheter cuff is right at the exit site.  Summary: 1) The patient had a successful 23 cm CTT Palindrome hemodialysis catheter exchange in the right internal jugular vein. 2) No sheath noted through either port. 3) Okay to use catheter immediately. Description of procedure: The right neck, chest and the catheter were prepped and draped in the usual sterile fashion. The exit site and adjacent tunnel tract were anesthetized with lidocaine 1% with epinephrine. The cuff was dissected free with a curved Kelly and manual traction. The catheter was withdrawn and venogram through each of the ports was performed; there was no fibrin sheath evident through either port. A hydrophilic wire was manipulated and advanced through the arterial port and the tip was parked in the IVC. The catheter was completely removed and noted to be entirely intact. A new 23 cm cuff to tip Palindrome catheter was inserted over the guidewire and the tip parked in the junction of the IVC/right atrium and at that point the cuff was approximately 1 cm deep to the exit site. Aspiration and flushing of both limbs of the catheter confirmed excellent flow. No kinks were visible on fluoroscopic imaging. Both limbs of the catheter were locked with heparin and sterile caps were placed. The hub was secured on to the chest wall with 2-0 nylon wing sutures. Sterile dressings were placed, and the patient returned to recovery in stable condition. Sedation: none Sedation time: n/a Contrast: 3 mL Monitoring: Because of the patient's comorbid conditions and sedation during the procedure, continuous EKG  monitoring and O2 saturation monitoring was performed throughout the procedure by the RN. There were no abnormal arrhythmias encountered. Complications: None. Diagnoses:  T82.49XA Other complication of vascular dialysis catheter (Poor flows) N18.6 End stage renal disease Z99.2 Dialysis dependence Procedures Coding: 16109 Tunneled catheter exchange 77001  Fluoroscopy guidance for catheter exchange. U0454 Contrast Recommendations: Remove the suture in 3 weeks. 2.   Report any blood flow problems to [facility]. Discharge: The patient was discharged home in stable condition. The patient was given education regarding the care of the catheter and specific instructions in case of any problems.    Labs:  CBC: Recent Labs    12/31/23 1748 01/01/24 0850 01/03/24 0714 01/05/24 1417  WBC 18.7* 13.2* 15.5* 16.1*  HGB 7.1* 7.9* 9.3* 7.8*  HCT 24.2* 25.6* 30.8* 26.0*  PLT 424* 369 290 355    COAGS: Recent Labs    09/18/23 0058  INR 1.1  APTT 29    BMP: Recent Labs    11/02/23 0454 11/03/23 0422 12/31/23 1748 01/01/24 0850  NA 133* 134* 134* 133*  K 3.2* 3.4* 4.4 4.4  CL 94* 98 98 97*  CO2 28 25 25 25   GLUCOSE 111* 82 117* 101*  BUN 20 22* 44* 54*  CALCIUM 8.5* 8.5* 9.0 8.8*  CREATININE 3.53* 2.41* 2.10* 2.44*  GFRNONAA 20* 32* 37* 31*    LIVER FUNCTION TESTS: Recent Labs    09/21/23 0831 09/21/23  1215 09/22/23 1631 09/23/23 0855 10/02/23 1403 11/01/23 0620 11/02/23 0454 12/31/23 1748  BILITOT 0.5  --  0.3  --   --  0.7  --  0.5  AST 27  --  31  --   --  27  --  31  ALT 28  --  30  --   --  28  --  42  ALKPHOS 117  --  116  --   --  120  --  142*  PROT 5.3*  --  4.5*  --   --  6.7  --  6.4*  ALBUMIN <1.5*   < > <1.5*   < > 2.0* 2.0* 1.9* 1.6*   < > = values in this interval not displayed.    TUMOR MARKERS: No results for input(s): "AFPTM", "CEA", "CA199", "CHROMGRNA" in the last 8760 hours.  Assessment and Plan: Hematuria post suprapubic catheter insertion  01/03/24.  Patient is a 52 y/o male with hematuria for the last 1-2 days. Insertion site unremarkable, but frank blood is present in the gravity bag.  Dr. Mosie Epstein evaluated the patient at bedside. CBC recommended and ordered. A cystoscopy is recommended to assess for the source of bleeding. Urology consulted to assist with patient care.    Thank you for this interesting consult.  I greatly enjoyed meeting Bruce Little and look forward to participating in their care.  A copy of this report was sent to the requesting provider on this date.  Electronically Signed: Rosalita Levan, PA 01/05/2024, 2:54 PM   I spent a total of    15 Minutes in face to face in clinical consultation, greater than 50% of which was counseling/coordinating care for hematuria

## 2024-01-05 NOTE — ED Provider Triage Note (Signed)
 Emergency Medicine Provider Triage Evaluation Note  Bruce Little , a 53 y.o. male  was evaluated in triage.  Pt complains of low hemoglobin. He had a suprapubic catheter placed 2 days ago, started having blood in the foley bag last night and today. Went to IR appointment, had hemoglobin checked and found to be 7.8. Given comorbid conditions, sent here for transfusion. No bleeding from around the catheter site. 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 Bloody urine flowing from the bag without issue  Review of Systems  Positive:  Negative:   Physical Exam  BP 116/66   Pulse 77   Temp 98.5 F (36.9 C)   Resp 15   Ht 6\' 2"  (1.88 m)   Wt 78 kg   SpO2 100%   BMI 22.08 kg/m  Gen:   Awake, no distress   Resp:  Normal effort  MSK:   Moves extremities without difficulty Other:  Bloody urine present in the foley bag. Patient chronically unwell appearing  Medical Decision Making  Medically screening exam initiated at 5:05 PM.  Appropriate orders placed.  Filipe E Fear was informed that the remainder of the evaluation will be completed by another provider, this initial triage assessment does not replace that evaluation, and the importance of remaining in the ED until their evaluation is complete.     Silva Bandy, PA-C 01/05/24 [phone]

## 2024-01-05 NOTE — ED Triage Notes (Addendum)
 The pt was dialyzed yesterday  dialysis cath rt upper chest

## 2024-01-06 ENCOUNTER — Encounter (HOSPITAL_COMMUNITY): Payer: Self-pay

## 2024-01-06 ENCOUNTER — Encounter (HOSPITAL_COMMUNITY): Payer: Self-pay | Admitting: Internal Medicine

## 2024-01-06 DIAGNOSIS — Z992 Dependence on renal dialysis: Secondary | ICD-10-CM

## 2024-01-06 DIAGNOSIS — R310 Gross hematuria: Secondary | ICD-10-CM

## 2024-01-06 DIAGNOSIS — N186 End stage renal disease: Secondary | ICD-10-CM

## 2024-01-06 DIAGNOSIS — Z89512 Acquired absence of left leg below knee: Secondary | ICD-10-CM

## 2024-01-06 DIAGNOSIS — D72829 Elevated white blood cell count, unspecified: Secondary | ICD-10-CM

## 2024-01-06 DIAGNOSIS — E871 Hypo-osmolality and hyponatremia: Secondary | ICD-10-CM

## 2024-01-06 DIAGNOSIS — D62 Acute posthemorrhagic anemia: Secondary | ICD-10-CM | POA: Diagnosis not present

## 2024-01-06 DIAGNOSIS — L89154 Pressure ulcer of sacral region, stage 4: Secondary | ICD-10-CM

## 2024-01-06 LAB — BASIC METABOLIC PANEL
Anion gap: 10 (ref 5–15)
BUN: 30 mg/dL — ABNORMAL HIGH (ref 6–20)
CO2: 22 mmol/L (ref 22–32)
Calcium: 8.4 mg/dL — ABNORMAL LOW (ref 8.9–10.3)
Chloride: 98 mmol/L (ref 98–111)
Creatinine, Ser: 2.4 mg/dL — ABNORMAL HIGH (ref 0.61–1.24)
GFR, Estimated: 32 mL/min — ABNORMAL LOW (ref >60)
Glucose, Bld: 91 mg/dL (ref 70–99)
Potassium: 4.2 mmol/L (ref 3.5–5.1)
Sodium: 130 mmol/L — ABNORMAL LOW (ref 135–145)

## 2024-01-06 LAB — CBC
HCT: 23.1 % — ABNORMAL LOW (ref 39.0–52.0)
Hemoglobin: 6.8 g/dL — CL (ref 13.0–17.0)
MCH: 26.7 pg (ref 26.0–34.0)
MCHC: 29.4 g/dL — ABNORMAL LOW (ref 30.0–36.0)
MCV: 90.6 fL (ref 80.0–100.0)
Platelets: 338 10*3/uL (ref 150–400)
RBC: 2.55 MIL/uL — ABNORMAL LOW (ref 4.22–5.81)
RDW: 18.1 % — ABNORMAL HIGH (ref 11.5–15.5)
WBC: 15 10*3/uL — ABNORMAL HIGH (ref 4.0–10.5)
nRBC: 0 % (ref 0.0–0.2)

## 2024-01-06 LAB — CBG MONITORING, ED
Glucose-Capillary: 89 mg/dL (ref 70–99)
Glucose-Capillary: 78 mg/dL (ref 70–99)

## 2024-01-06 LAB — SURGICAL PATHOLOGY

## 2024-01-06 LAB — GLUCOSE, CAPILLARY
Glucose-Capillary: 73 mg/dL (ref 70–99)
Glucose-Capillary: 98 mg/dL (ref 70–99)
Glucose-Capillary: 82 mg/dL (ref 70–99)
Glucose-Capillary: 113 mg/dL — ABNORMAL HIGH (ref 70–99)

## 2024-01-06 LAB — PREPARE RBC (CROSSMATCH)

## 2024-01-06 LAB — HEPATITIS B SURFACE ANTIGEN: Hepatitis B Surface Ag: NONREACTIVE

## 2024-01-06 MED ORDER — ALTEPLASE 2 MG IJ SOLR: 2.0000 mg | Freq: Once | INTRAMUSCULAR | Status: DC | PRN

## 2024-01-06 MED ORDER — HEPARIN SODIUM (PORCINE) 1000 UNIT/ML DIALYSIS
1000.0000 [IU] | INTRAMUSCULAR | Status: DC | PRN
  Filled 2024-01-06: qty 1

## 2024-01-06 MED ORDER — PENTAFLUOROPROP-TETRAFLUOROETH EX AERO: 1.0000 | INHALATION_SPRAY | CUTANEOUS | Status: DC | PRN

## 2024-01-06 MED ORDER — LIDOCAINE HCL (PF) 1 % IJ SOLN: 5.0000 mL | INTRAMUSCULAR | Status: DC | PRN

## 2024-01-06 MED ORDER — LIDOCAINE-PRILOCAINE 2.5-2.5 % EX CREA: 1.0000 | TOPICAL_CREAM | CUTANEOUS | Status: DC | PRN

## 2024-01-06 MED ORDER — SODIUM CHLORIDE 0.9% IV SOLUTION: Freq: Once | INTRAVENOUS | Status: DC

## 2024-01-06 MED ORDER — CHLORHEXIDINE GLUCONATE CLOTH 2 % EX PADS
6.0000 | MEDICATED_PAD | Freq: Every day | CUTANEOUS | Status: DC
  Administered 2024-01-07: 6 via TOPICAL

## 2024-01-06 MED ORDER — ONDANSETRON 4 MG PO TBDP
4.0000 mg | ORAL_TABLET | Freq: Four times a day (QID) | ORAL | Status: DC | PRN
  Administered 2024-01-06 – 2024-01-08 (×2): 4 mg via ORAL
  Filled 2024-01-06 (×3): qty 1

## 2024-01-06 MED ORDER — ANTICOAGULANT SODIUM CITRATE 4% (200MG/5ML) IV SOLN: 5.0000 mL | Status: DC | PRN

## 2024-01-06 NOTE — Assessment & Plan Note (Addendum)
 01-06-2024 present on admission.  Wound care consulted. Pt has negative pressure wound vac. Wife states he goes to wound care clinic. 01-07-2024 has acute osteo of sacrum. ID consulted. Stage 4 wound.   01-08-2024 ID does not think pt need abx therapy. 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 F/u with wound care clinic.

## 2024-01-06 NOTE — Consult Note (Signed)
 Reason for Consult:ESRD Referring Physician: Dr. Toniann Fail  Chief Complaint: Hematuria  Dialysis orders: TTS 2nd shift @ [facility]  Time 4hrs, 160NRe EDW 77.5kg (left at 76.2kg last treatment) 500/1.5 3/2 bath Mircera 225 mcg q. 2 weeks last given on February 25 Hectorol IV three times per week (on hold)  Assessment/Plan: ESRD on dialysis Tuesdays Thursdays and Saturdays with last treatment on Tuesday.  Will place him on schedule for today without any heparin.  Recently had his dry weight decreased from 79.5 to 77.5 kg.   - Patient is actually still leaving below his dry weight at 76.2 kg after his treatment on the 25th. -Spouse sometimes has to give water through the PEG tube if he is volume down; flushes it daily with approximately 30 cc of tap water.  Additional recommendations - Dose all meds for creatinine clearance < 10 ml/min  - Unless absolutely necessary, no MRIs with gadolinium.  - Prefer needle sticks in the dorsum of the hands or wrists.  No blood pressure measurements in right arm. - If blood transfusion is requested during hemodialysis sessions, please alert [place] prior to the session.  - If a hemodialysis catheter line culture is requested, please alert [place] as only hemodialysis nurses are able to collect those specimens.   Avoid nephrotoxic medications including NSAIDs and iodinated intravenous contrast exposure unless the latter is absolutely indicated.  Preferred narcotic agents for pain control are hydromorphone, fentanyl, and methadone. Morphine should not be used. Avoid Baclofen and avoid oral sodium phosphate and magnesium citrate based laxatives / bowel preps. Continue strict Input and Output monitoring. Will monitor the patient closely with you and intervene or adjust therapy as indicated by changes in clinical status/labs   Renal osteodystrophy -check phosphorus, not currently on binders. His phosphorus was down to 2.5 on February 20.Marland Kitchen Hematuria with chronic  indwelling Foley recently changed to a suprapubic catheter by urology with recommendation of holding the anticoagulation, transfusing as needed. DM -on Lantus in the morning and glipizide currently on sliding scale coverage. Atrial fibrillation on amiodarone and beta-blockers not on anticoagulation because of a history of intracranial bleed in the past. History of craniectomy currently on Keppra.   HPI: Bruce Little is an 53 y.o. 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  Patient's last dialysis treatment was on 2/25 for a full treatment with leaving at 76.2 kg which is below his EDW of 77.5 kg.  She was recently admitted with hypoglycemia and he was instructed to hold his glipizide on mornings when he is scheduled to be n.p.o. in the future.  Patient had a chronic indwelling Foley but that was changed to a CT-guided suprapubic catheter placement with a 1 [place] by VIR on February 24.  Since then patient has had persistent hematuria which is why he is coming to the emergency department.  Globin was noted to be 7.8 down from 9.33 days ago in the emergency department.  Patient was also seen by urology follow-up as an outpatient by Dr. Arita Miss.  ROS Pertinent items are noted in HPI.  Chemistry and CBC: Creat  Date/Time Value Ref Range Status  10/13/2021 11:39 AM 2.73 (H) 0.60 - 1.29 mg/dL Final   Creatinine, Ser  Date/Time Value Ref Range Status  01/06/2024 04:17 AM 2.40 (H) 0.61 - 1.24 mg/dL Final  86/57/8469 62:95  PM 2.07 (H) 0.61 - 1.24 mg/dL Final  16/08/9603 54:09 AM 2.44 (H) 0.61 - 1.24 mg/dL Final  81/19/1478 29:56 PM 2.10 (H) 0.61 - 1.24 mg/dL Final  21/30/8657 84:69 AM 2.41 (H) 0.61 - 1.24 mg/dL Final  62/95/2841 32:44 AM 3.53 (H) 0.61 - 1.24 mg/dL Final  11/11/7251 66:44 AM 3.29 (H) 0.61 - 1.24  mg/dL Final  03/47/4259 56:38 AM 2.22 (H) 0.61 - 1.24 mg/dL Final  75/64/3329 51:88 PM 1.59 (H) 0.61 - 1.24 mg/dL Final    Comment:    DELTA CHECK NOTED  09/30/2023 05:53 AM 2.81 (H) 0.61 - 1.24 mg/dL Final  41/66/0630 16:01 AM 2.80 (H) 0.61 - 1.24 mg/dL Final  09/32/3557 32:20 AM 2.42 (H) 0.61 - 1.24 mg/dL Final  25/42/7062 37:62 AM 2.45 (H) 0.61 - 1.24 mg/dL Final  83/15/1761 60:73 AM 1.95 (H) 0.61 - 1.24 mg/dL Final  71/04/2693 85:46 AM 2.38 (H) 0.61 - 1.24 mg/dL Final  27/01/5008 38:18 PM 1.84 (H) 0.61 - 1.24 mg/dL Final  29/93/7169 67:89 PM 2.54 (H) 0.61 - 1.24 mg/dL Final  38/08/1750 02:58 AM 2.63 (H) 0.61 - 1.24 mg/dL Final    Comment:    DELTA CHECK NOTED  09/19/2023 04:52 AM 1.15 0.61 - 1.24 mg/dL Final  52/77/8242 35:36 AM 1.85 (H) 0.61 - 1.24 mg/dL Final  14/43/1540 08:67 AM 1.69 (H) 0.61 - 1.24 mg/dL Final  61/95/0932 67:12 AM 2.97 (H) 0.61 - 1.24 mg/dL Final  45/80/9983 38:25 AM 2.50 (H) 0.61 - 1.24 mg/dL Final  05/39/7673 41:93 AM 2.69 (H) 0.61 - 1.24 mg/dL Final  79/12/4095 35:32 AM 2.99 (H) 0.61 - 1.24 mg/dL Final  99/24/2683 41:96 AM 1.86 (H) 0.61 - 1.24 mg/dL Final  22/29/7989 21:19 AM 1.41 (H) 0.61 - 1.24 mg/dL Final  41/74/0814 48:18 AM 2.62 (H) 0.61 - 1.24 mg/dL Final  56/31/4970 26:37 AM 3.00 (H) 0.61 - 1.24 mg/dL Final  85/88/5027 74:12 AM 2.60 (H) 0.61 - 1.24 mg/dL Final  87/86/7672 09:47 AM 2.74 (H) 0.61 - 1.24 mg/dL Final  09/62/8366 29:47 AM 2.86 (H) 0.61 - 1.24 mg/dL Final  65/46/5035 46:56 AM 3.39 (H) 0.61 - 1.24 mg/dL Final  81/27/5170 01:74 AM 3.06 (H) 0.61 - 1.24 mg/dL Final  94/49/6759 16:38 AM 3.37 (H) 0.61 - 1.24 mg/dL Final  46/65/9935 70:17 AM 3.49 (H) 0.61 - 1.24 mg/dL Final  79/39/0300 92:33 AM 2.77 (H) 0.61 - 1.24 mg/dL Final  00/76/2263 33:54 AM 2.59 (H) 0.61 - 1.24 mg/dL Final  56/25/6389 37:34 AM 3.56 (H) 0.61 - 1.24 mg/dL Final  28/76/8115 72:62 PM 2.44 (H) 0.61 - 1.24 mg/dL Final  03/55/9741 63:84 AM 3.40 (H) 0.61 - 1.24 mg/dL Final   53/64/6803 21:22 PM 3.02 (H) 0.61 - 1.24 mg/dL Final  48/25/0037 04:88 AM 2.79 (H) 0.61 - 1.24 mg/dL Final  89/16/9450 38:88 PM 3.02 (H) 0.61 - 1.24 mg/dL Final  28/00/3491 79:15 PM 3.40 (H) 0.61 - 1.24 mg/dL Final  05/69/7948 01:65 AM 3.80 (H) 0.61 - 1.24 mg/dL Final  53/74/8270 78:67 AM 4.44 (H) 0.61 - 1.24 mg/dL Final  54/49/2010 07:12 AM 4.77 (H) 0.61 - 1.24 mg/dL Final  19/75/8832 54:98 AM 5.10 (H) 0.61 - 1.24 mg/dL Final  26/41/5830 94:07 AM 5.33 (H) 0.61 - 1.24 mg/dL Final  68/06/8109 31:59 AM 5.42 (H) 0.61 - 1.24 mg/dL Final  45/85/9292 44:62 AM 5.48 (H) 0.61 - 1.24 mg/dL Final   Recent Labs  Lab 12/31/23 1748 01/01/24 0850 01/05/24 1701 01/06/24 0417  NA 134* 133* 132* 130*  K 4.4 4.4 4.5 4.2  CL 98 97* 97* 98  CO2 25 25 26 22   GLUCOSE 117* 101* 70 91  BUN 44* 54* 26* 30*  CREATININE 2.10* 2.44* 2.07* 2.40*  CALCIUM 9.0 8.8* 9.2 8.4*   Recent Labs  Lab 01/03/24 0714 01/05/24 1417 01/05/24 1701 01/06/24 0417  WBC 15.5* 16.1* 15.3* 15.0*  HGB 9.3* 7.8* 7.8* 6.8*  HCT 30.8* 26.0* 25.9* 23.1*  MCV 88.3 88.4 90.2 90.6  PLT 290 355 337 338   Liver Function Tests: Recent Labs  Lab 12/31/23 1748 01/05/24 1701  AST 31 25  ALT 42 31  ALKPHOS 142* 133*  BILITOT 0.5 0.6  PROT 6.4* 6.3*  ALBUMIN 1.6* 1.6*   No results for input(s): "LIPASE", "AMYLASE" in the last 168 hours. No results for input(s): "AMMONIA" in the last 168 hours. Cardiac Enzymes: No results for input(s): "CKTOTAL", "CKMB", "CKMBINDEX", "TROPONINI" in the last 168 hours. Iron Studies: No results for input(s): "IRON", "TIBC", "TRANSFERRIN", "FERRITIN" in the last 72 hours. PT/INR: @LABRCNTIP (inr:5)  Xrays/Other Studies: ) Results for orders placed or performed during the hospital encounter of 01/05/24 (from the past 48 hours)  Comprehensive metabolic panel     Status: Abnormal   Collection Time: 01/05/24  5:01 PM  Result Value Ref Range   Sodium 132 (L) 135 - 145 mmol/L   Potassium 4.5  3.5 - 5.1 mmol/L   Chloride 97 (L) 98 - 111 mmol/L   CO2 26 22 - 32 mmol/L   Glucose, Bld 70 70 - 99 mg/dL    Comment: Glucose reference range applies only to samples taken after fasting for at least 8 hours.   BUN 26 (H) 6 - 20 mg/dL   Creatinine, Ser 2.95 (H) 0.61 - 1.24 mg/dL   Calcium 9.2 8.9 - 62.1 mg/dL   Total Protein 6.3 (L) 6.5 - 8.1 g/dL   Albumin 1.6 (L) 3.5 - 5.0 g/dL   AST 25 15 - 41 U/L   ALT 31 0 - 44 U/L   Alkaline Phosphatase 133 (H) 38 - 126 U/L   Total Bilirubin 0.6 0.0 - 1.2 mg/dL   GFR, Estimated 38 (L) >60 mL/min    Comment: (NOTE) Calculated using the CKD-EPI Creatinine Equation (2021)    Anion gap 9 5 - 15    Comment: Performed at [facility], [address], [place], [place] [address]  CBC     Status: Abnormal   Collection Time: 01/05/24  5:01 PM  Result Value Ref Range   WBC 15.3 (H) 4.0 - 10.5 K/uL   RBC 2.87 (L) 4.22 - 5.81 MIL/uL   Hemoglobin 7.8 (L) 13.0 - 17.0 g/dL   HCT 78.4 (L) 69.6 - 29.5 %   MCV 90.2 80.0 - 100.0 fL   MCH 27.2 26.0 - 34.0 pg   MCHC 30.1 30.0 - 36.0 g/dL   RDW 28.4 (H) 13.2 - 44.0 %   Platelets 337 150 - 400 K/uL   nRBC 0.0 0.0 - 0.2 %    Comment: Performed at [facility], [address], [place], [place] [address]  Type and screen [facility]     Status: None (Preliminary result)   Collection Time: 01/05/24  5:02 PM  Result Value Ref Range   ABO/RH(D) B POS    Antibody Screen NEG    Sample Expiration 01/08/2024,2359    Unit Number [id number]    Blood Component Type RED CELLS,LR    Unit division 00  Status of Unit ISSUED    Transfusion Status OK TO TRANSFUSE    Crossmatch Result      Compatible Performed at [facility], [address], [place], [place] [address]   CBG monitoring, ED     Status: None   Collection Time: 01/06/24  2:29 AM  Result Value Ref Range   Glucose-Capillary 89 70 - 99 mg/dL    Comment: Glucose reference range applies only to samples taken after  fasting for at least 8 hours.  Basic metabolic panel     Status: Abnormal   Collection Time: 01/06/24  4:17 AM  Result Value Ref Range   Sodium 130 (L) 135 - 145 mmol/L   Potassium 4.2 3.5 - 5.1 mmol/L   Chloride 98 98 - 111 mmol/L   CO2 22 22 - 32 mmol/L   Glucose, Bld 91 70 - 99 mg/dL    Comment: Glucose reference range applies only to samples taken after fasting for at least 8 hours.   BUN 30 (H) 6 - 20 mg/dL   Creatinine, Ser 9.52 (H) 0.61 - 1.24 mg/dL   Calcium 8.4 (L) 8.9 - 10.3 mg/dL   GFR, Estimated 32 (L) >60 mL/min    Comment: (NOTE) Calculated using the CKD-EPI Creatinine Equation (2021)    Anion gap 10 5 - 15    Comment: Performed at [facility], [address], [place], [place] [address]  CBC     Status: Abnormal   Collection Time: 01/06/24  4:17 AM  Result Value Ref Range   WBC 15.0 (H) 4.0 - 10.5 K/uL   RBC 2.55 (L) 4.22 - 5.81 MIL/uL   Hemoglobin 6.8 (LL) 13.0 - 17.0 g/dL    Comment: REPEATED TO VERIFY THIS CRITICAL RESULT HAS VERIFIED AND BEEN CALLED TO A MAYHEW RN BY JALEESA WHITE ON 02 27 2025 AT 0434, AND HAS BEEN READ BACK.     HCT 23.1 (L) 39.0 - 52.0 %   MCV 90.6 80.0 - 100.0 fL   MCH 26.7 26.0 - 34.0 pg   MCHC 29.4 (L) 30.0 - 36.0 g/dL   RDW 44.0 (H) 10.2 - 72.5 %   Platelets 338 150 - 400 K/uL   nRBC 0.0 0.0 - 0.2 %    Comment: Performed at [facility], [address], [place], [place] [address]  CBG monitoring, ED     Status: None   Collection Time: 01/06/24  7:25 AM  Result Value Ref Range   Glucose-Capillary 78 70 - 99 mg/dL    Comment: Glucose reference range applies only to samples taken after fasting for at least 8 hours.  Prepare RBC (crossmatch)     Status: None   Collection Time: 01/06/24  7:33 AM  Result Value Ref Range   Order Confirmation      ORDER PROCESSED BY BLOOD BANK Performed at [facility], [address], [place], [place] [address]   Glucose, capillary     Status: None   Collection Time: 01/06/24 11:39  AM  Result Value Ref Range   Glucose-Capillary 98 70 - 99 mg/dL    Comment: Glucose reference range applies only to samples taken after fasting for at least 8 hours.   IR Radiologist Eval & Mgmt Result Date: 01/05/2024 EXAM: NEW PATIENT OFFICE VISIT CHIEF COMPLAINT: Electronic medical record HISTORY OF PRESENT ILLNESS: Electronic medical record REVIEW OF SYSTEMS: Electronic medical record PHYSICAL EXAMINATION: Electronic medical record ASSESSMENT AND PLAN: Electronic medical record Electronically  Signed   By: Gilmer Mor D.O.   On: 01/05/2024 16:20    PMH:   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   COVID    has had it 2 times, one mild and one wasn't   Depression    DM2 (diabetes mellitus, type 2) (HCC)    Family history of adverse reaction to anesthesia    Dad has a "hard time waking up" after anesthesia   GERD (gastroesophageal reflux disease)    Hemorrhagic stroke (HCC) 04/30/2023   s/p right decompressive craniectomy and evacuation of hematoma on 04/30/2023   History of blood transfusion    HTN (hypertension)    Osteomyelitis of fifth toe of left foot (HCC) 08/13/2021   Osteomyelitis of fourth toe of left foot (HCC) 08/13/2021   Pneumonia    Sacral decubitus ulcer 05/2023   Stroke (HCC) 05/19/2021   unable to move left side    PSH:   Past Surgical History:  Procedure Laterality Date   AMPUTATION Left 08/29/2021   Procedure: AMPUTATION OF FOURTH TOE AND RAY ALONG WITH REMAINING FITH METATARSAL;  Surgeon: Tarry Kos, MD;  Location: [facility] OR;  Service: Orthopedics;  Laterality: Left;   AMPUTATION Left 09/03/2021   Procedure: LISFRANC AMPUTATION;  Surgeon: Tarry Kos, MD;  Location: [facility] OR;  Service: Orthopedics;  Laterality: Left;   AMPUTATION Left 10/29/2021   Procedure: AMPUTATION BELOW KNEE -LEFT;  Surgeon: Tarry Kos, MD;  Location: [facility] OR;   Service: Orthopedics;  Laterality: Left;   APPLICATION OF WOUND VAC Left 07/07/2021   Procedure: APPLICATION OF WOUND VAC;  Surgeon: Tarry Kos, MD;  Location: [facility] OR;  Service: Orthopedics;  Laterality: Left;   APPLICATION OF WOUND VAC Left 08/29/2021   Procedure: APPLICATION OF WOUND VAC;  Surgeon: Tarry Kos, MD;  Location: [facility] OR;  Service: Orthopedics;  Laterality: Left;   DIALYSIS/PERMA CATHETER INSERTION N/A 12/28/2023   Procedure: DIALYSIS/PERMA CATHETER INSERTION;  Surgeon: Dagoberto Ligas, MD;  [facility] INVASIVE CV LAB;  Service: Cardiovascular;  Laterality: N/A;   DIALYSIS/PERMA CATHETER REMOVAL N/A 12/28/2023   Procedure: DIALYSIS/PERMA CATHETER REMOVAL;  Surgeon: Dagoberto Ligas, MD;  Location: [facility] INVASIVE CV LAB;  Service: Cardiovascular;  Laterality: N/A;   I & D EXTREMITY Left 07/03/2021   Procedure: IRRIGATION AND DEBRIDEMENT ,FIFTH RAY  AMPUTATION LEFT FOOT, , WOUND VAC PLACEMENT;  Surgeon: Tarry Kos, MD;  Location: [facility];  Service: Orthopedics;  Laterality: Left;   I & D EXTREMITY Left 07/07/2021   Procedure: IRRIGATION AND DEBRIDEMENT LEFT FOOT;  Surgeon: Tarry Kos, MD;  Location: [facility];  Service: Orthopedics;  Laterality: Left;   I & D EXTREMITY Left 08/29/2021   Procedure: IRRIGATION AND DEBRIDEMENT LEFT FOOT;  Surgeon: Tarry Kos, MD;  Location: [facility];  Service: Orthopedics;  Laterality: Left;   IR FLUORO GUIDE CV LINE RIGHT  07/09/2021   IR RADIOLOGIST EVAL & MGMT  01/05/2024   IR REMOVAL TUN CV CATH W/O FL  10/08/2021   IR REMOVAL TUN CV CATH W/O FL  07/13/2023   IR REPLACE G-TUBE SIMPLE WO FLUORO  09/20/2023   IR US GUIDE VASC ACCESS RIGHT  07/09/2021   TRACHEOSTOMY     05/14/2023 - 07/23/2023   VITRECTOMY Left    [facility]    Allergies:  No Known Allergies  Medications:   Prior to Admission medications   Medication Sig Start Date End Date Taking? Authorizing Provider  acetaminophen (TYLENOL) 500 MG tablet Take 1,000 mg by mouth every 6 (six) hours  as needed for mild pain (pain score 1-3) or headache.   Yes [provider]  amiodarone (PACERONE) 100 MG tablet Take 100 mg by mouth daily.   Yes [provider]  amLODipine (NORVASC) 10 MG tablet Take 10 mg by mouth daily.   Yes [provider]  ascorbic acid (VITAMIN C) 500 MG tablet Take 1 tablet (500 mg total) by mouth 2 (two) times daily. 10/02/23  Yes [name], MD  ASHWAGANDHA PO Take 1 Dose by mouth at bedtime. 1 dose- 2 gummies   Yes [provider]  aspirin EC 81 MG tablet Take 81 mg by mouth at bedtime. Swallow whole.   Yes [provider]  atorvastatin (LIPITOR) 40 MG tablet Take 40 mg by mouth at bedtime.   Yes [provider]  Calcium Carb-Cholecalciferol (CALCIUM 500 + D3 PO) Take 1 tablet by mouth daily.   Yes [provider]  carvedilol (COREG) 25 MG tablet Take 1 tablet (25 mg total) by mouth 2 (two) times daily with a meal. 10/02/23  Yes [name], MD  cloNIDine (CATAPRES) 0.1 MG tablet Take 1 tablet (0.1 mg total) by mouth daily. 10/02/23  Yes [name], MD  diphenhydramine-acetaminophen (TYLENOL PM) 25-500 MG TABS tablet Take 2 tablets by mouth at bedtime as needed.   Yes [provider]  finasteride (PROSCAR) 5 MG tablet Take 5 mg by mouth daily.   Yes [provider]  furosemide (LASIX) 40 MG tablet Take 40 mg by mouth daily.   Yes [provider]  glipiZIDE (GLUCOTROL) 5 MG tablet Take 1 tablet (5 mg total) by mouth daily. 05/21/21 05/08/24 Yes Sheikh, Omair Latif, DO  hydrALAZINE (APRESOLINE) 25 MG tablet Take 25 mg by mouth 3 (three) times daily.   Yes [provider]  LANTUS SOLOSTAR 100 UNIT/ML Solostar Pen Inject 5 Units into the skin daily. Patient taking differently: Inject 5 Units into the skin at bedtime. 10/02/23  Yes [name], MD  levETIRAcetam (KEPPRA) 750 MG tablet Take 1,500 mg by mouth 2 (two) times daily.   Yes  [provider]  losartan (COZAAR) 50 MG tablet Take 1 tablet (50 mg total) by mouth daily. 10/02/23  Yes [name], MD  melatonin 5 MG TABS Take 5-10 mg by mouth at bedtime as needed (sleep).   Yes [provider]  methocarbamol (ROBAXIN) 500 MG tablet Take 1 tablet (500 mg total) by mouth every 8 (eight) hours as needed for muscle spasms. Patient taking differently: Take 500 mg by mouth 2 (two) times daily. May take a third 500 mg dose midday as needed for muscle spasms 10/02/23  Yes [name], MD  multivitamin (RENA-VIT) TABS tablet Take 1 tablet by mouth at bedtime. 10/02/23  Yes [name], MD  mupirocin ointment (BACTROBAN) 2 % Apply 1 Application topically 2 (two) times daily. 12/24/23  Yes [provider]  NATURAL VITAMIN A PO Take 3,000 mcg by mouth daily.   Yes [provider]  ondansetron (ZOFRAN-ODT) 4 MG disintegrating tablet Take 4 mg by mouth every 8 (eight) hours as needed for nausea or vomiting.   Yes [provider]  pantoprazole (PROTONIX) 40 MG tablet Take 1 tablet (40 mg total) by mouth daily.  05/22/21  Yes Sheikh, Omair Latif, DO  PARoxetine (PAXIL) 10 MG tablet Take 10 mg by mouth daily.   Yes [provider]  polyethylene glycol (MIRALAX / GLYCOLAX) 17 g packet Take 17 g by mouth 2 (two) times daily. Patient taking differently: Take 17 g by mouth daily as needed for moderate constipation. 11/03/23  Yes Osvaldo Shipper, MD  promethazine (PHENERGAN) 12.5 MG tablet Take 1 tablet (12.5 mg total) by mouth every 6 (six) hours as needed for nausea or vomiting. 11/03/23  Yes Osvaldo Shipper, MD  senna-docusate (SENOKOT-S) 8.6-50 MG tablet Take 2 tablets by mouth 2 (two) times daily. Patient taking differently: Take 2 tablets by mouth 2 (two) times daily as needed for mild constipation. 11/03/23  Yes Osvaldo Shipper, MD  terazosin (HYTRIN) 1 MG capsule Take 1 mg by mouth at bedtime.   Yes [provider]  Vitamin D, Ergocalciferol, (DRISDOL) 1.25 MG (50000 UNIT) CAPS capsule Take 1 capsule (50,000 Units total) by mouth every 7 (seven) days. 10/03/23  Yes [name], MD  doxycycline (VIBRAMYCIN) 100 MG capsule Take 100 mg by mouth 2 (two) times daily. Patient not taking: Reported on 01/01/2024 12/24/23   [provider]    Discontinued Meds:  There are no discontinued medications.  Social History:  reports that he has never smoked. He has never used smokeless tobacco. He reports that he does not currently use alcohol. He reports that he does not use drugs.  Family History:   Family History  Problem Relation Age of Onset   Stroke Mother    Cancer Mother    Heart disease Father     Blood pressure 135/81, pulse 80, temperature 98.2 F (36.8 C), temperature source Oral, resp. rate 17, height 6\' 2"  (1.88 m), weight 78 kg, SpO2 99%. General: Chronically ill appearing male, in no acute distress. Head: NCAT Neck: Supple. JVD not elevated. Lungs: CTA b/l Heart: RRR with S1 S2.  Abdomen: Soft, NABS. Peg tube in place. Suprapubic catheter reservoir red colored Lower extremities: L BKA, No RLE edema Skin: Sacral decub with wound Neuro: Alert and oriented X 3. Moves all extremities spontaneously. Weakness of left side Psych:  Responds to questions appropriately with a normal affect. Dialysis Access: RIJ TDC       Ethelene Hal, MD 01/06/2024, 12:37 PM

## 2024-01-06 NOTE — Subjective & Objective (Addendum)
 Pt seen and examined. Wife [name] at bedside. Hematuria has resolved. Only minimal pink tinged blood in suprapubic tube tubing.  Wife wants to take patient home today. She has her own transport van. Pt has his wheelchair here.

## 2024-01-06 NOTE — Assessment & Plan Note (Addendum)
 01-06-2024 stable. On cozaar, clonidine, coreg, hytrin. 01-07-2024 HTN well controlled.   01-08-2024 stable.

## 2024-01-06 NOTE — Assessment & Plan Note (Addendum)
 01-06-2024 due to recent suprapubic tube placement. Urology consulted. Defer to urology if he needs cystoscopy. 01-07-2024 Hematuria is slowing down. Not as much blood in urinary bag. Urology not planning on any intervention. Urology has signed off.   01-08-2024 HgB stable. Hold ASA for at least 2 weeks after discharge.

## 2024-01-06 NOTE — Assessment & Plan Note (Addendum)
 01-06-2024 monitor CBC. 01-07-2024 WBC stable at 15K. ID consulted for his acute sacral osteo.  01-08-2024 stable. Not on abx per ID.

## 2024-01-06 NOTE — Consult Note (Signed)
 Urology Consult   Physician requesting consult: Dr. Toniann Fail  Reason for consult: Hematuria  History of Present Illness: Bruce Little is a 53 y.o. with prior intracranial bleed resulting in hemorrhagic stroke, atrial fibrillation, and ESRD with chronic anemia.  He is followed by Dr. Arita Miss for urinary retention and underwent percutaneous SP tube placement by IR yesterday.  Due to concerns about hematuria and his chronic anemia, he was sent to the ED and to [facility] considering his ESRD status.  He has been monitored with serial Hgb overnight without transfusion.  He has been hemodynamically stable.   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   COVID    has had it 2 times, one mild and one wasn't   Depression    DM2 (diabetes mellitus, type 2) (HCC)    Family history of adverse reaction to anesthesia    Dad has a "hard time waking up" after anesthesia   GERD (gastroesophageal reflux disease)    Hemorrhagic stroke (HCC) 04/30/2023   s/p right decompressive craniectomy and evacuation of hematoma on 04/30/2023   History of blood transfusion    HTN (hypertension)    Osteomyelitis of fifth toe of left foot (HCC) 08/13/2021   Osteomyelitis of fourth toe of left foot (HCC) 08/13/2021   Pneumonia    Sacral decubitus ulcer 05/2023   Stroke (HCC) 05/19/2021   unable to move left side    Past Surgical History:  Procedure Laterality Date   AMPUTATION Left 08/29/2021   Procedure: AMPUTATION OF FOURTH TOE AND RAY ALONG WITH REMAINING FITH METATARSAL;  Surgeon: Tarry Kos, MD;  Location: [facility] OR;  Service: Orthopedics;  Laterality: Left;   AMPUTATION Left 09/03/2021   Procedure: LISFRANC AMPUTATION;  Surgeon: Tarry Kos, MD;  Location: [facility] OR;  Service: Orthopedics;  Laterality: Left;   AMPUTATION Left 10/29/2021   Procedure: AMPUTATION BELOW KNEE -LEFT;  Surgeon: Tarry Kos,  MD;  Location: [facility] OR;  Service: Orthopedics;  Laterality: Left;   APPLICATION OF WOUND VAC Left 07/07/2021   Procedure: APPLICATION OF WOUND VAC;  Surgeon: Tarry Kos, MD;  Location: [facility] OR;  Service: Orthopedics;  Laterality: Left;   APPLICATION OF WOUND VAC Left 08/29/2021   Procedure: APPLICATION OF WOUND VAC;  Surgeon: Tarry Kos, MD;  Location: [facility];  Service: Orthopedics;  Laterality: Left;   DIALYSIS/PERMA CATHETER INSERTION N/A 12/28/2023   Procedure: DIALYSIS/PERMA CATHETER INSERTION;  Surgeon: Dagoberto Ligas, MD;  Location: [facility] INVASIVE CV LAB;  Service: Cardiovascular;  Laterality: N/A;   DIALYSIS/PERMA CATHETER REMOVAL N/A 12/28/2023   Procedure: DIALYSIS/PERMA CATHETER REMOVAL;  Surgeon: Dagoberto Ligas, MD;  Location: [facility] INVASIVE CV LAB;  Service: Cardiovascular;  Laterality: N/A;   I & D EXTREMITY Left 07/03/2021   Procedure: IRRIGATION AND DEBRIDEMENT ,FIFTH RAY  AMPUTATION LEFT FOOT, , WOUND VAC PLACEMENT;  Surgeon: Tarry Kos, MD;  Location: [facility];  Service: Orthopedics;  Laterality: Left;   I & D EXTREMITY Left 07/07/2021   Procedure: IRRIGATION AND DEBRIDEMENT LEFT FOOT;  Surgeon: Tarry Kos, MD;  Location: [facility];  Service: Orthopedics;  Laterality: Left;   I & D EXTREMITY Left 08/29/2021   Procedure: IRRIGATION AND DEBRIDEMENT LEFT FOOT;  Surgeon: Tarry Kos, MD;  Location: [facility];  Service: Orthopedics;  Laterality: Left;   IR FLUORO  GUIDE CV LINE RIGHT  07/09/2021   IR RADIOLOGIST EVAL & MGMT  01/05/2024   IR REMOVAL TUN CV CATH W/O FL  10/08/2021   IR REMOVAL TUN CV CATH W/O FL  07/13/2023   IR REPLACE G-TUBE SIMPLE WO FLUORO  09/20/2023   IR US GUIDE VASC ACCESS RIGHT  07/09/2021   TRACHEOSTOMY     05/14/2023 - 07/23/2023   VITRECTOMY Left    [facility]    Current Hospital Medications:  Home Meds:  No current facility-administered medications on file prior to encounter.   Current Outpatient Medications on File Prior to Encounter  Medication Sig Dispense  Refill   acetaminophen (TYLENOL) 500 MG tablet Take 1,000 mg by mouth every 6 (six) hours as needed for mild pain (pain score 1-3) or headache.     amiodarone (PACERONE) 100 MG tablet Take 100 mg by mouth daily.     amLODipine (NORVASC) 10 MG tablet Take 10 mg by mouth daily.     ascorbic acid (VITAMIN C) 500 MG tablet Take 1 tablet (500 mg total) by mouth 2 (two) times daily. 60 tablet 0   ASHWAGANDHA PO Take 1 Dose by mouth at bedtime. 1 dose- 2 gummies     aspirin EC 81 MG tablet Take 81 mg by mouth at bedtime. Swallow whole.     atorvastatin (LIPITOR) 40 MG tablet Take 40 mg by mouth at bedtime.     Calcium Carb-Cholecalciferol (CALCIUM 500 + D3 PO) Take 1 tablet by mouth daily.     carvedilol (COREG) 25 MG tablet Take 1 tablet (25 mg total) by mouth 2 (two) times daily with a meal. 60 tablet 0   cloNIDine (CATAPRES) 0.1 MG tablet Take 1 tablet (0.1 mg total) by mouth daily. 30 tablet 0   diphenhydramine-acetaminophen (TYLENOL PM) 25-500 MG TABS tablet Take 2 tablets by mouth at bedtime as needed.     finasteride (PROSCAR) 5 MG tablet Take 5 mg by mouth daily.     furosemide (LASIX) 40 MG tablet Take 40 mg by mouth daily.     glipiZIDE (GLUCOTROL) 5 MG tablet Take 1 tablet (5 mg total) by mouth daily. 30 tablet 0   hydrALAZINE (APRESOLINE) 25 MG tablet Take 25 mg by mouth 3 (three) times daily.     LANTUS SOLOSTAR 100 UNIT/ML Solostar Pen Inject 5 Units into the skin daily. (Patient taking differently: Inject 5 Units into the skin at bedtime.) 15 mL 0   levETIRAcetam (KEPPRA) 750 MG tablet Take 1,500 mg by mouth 2 (two) times daily.     losartan (COZAAR) 50 MG tablet Take 1 tablet (50 mg total) by mouth daily. 30 tablet 0   melatonin 5 MG TABS Take 5-10 mg by mouth at bedtime as needed (sleep).     methocarbamol (ROBAXIN) 500 MG tablet Take 1 tablet (500 mg total) by mouth every 8 (eight) hours as needed for muscle spasms. (Patient taking differently: Take 500 mg by mouth 2 (two) times daily.  May take a third 500 mg dose midday as needed for muscle spasms) 30 tablet 0   multivitamin (RENA-VIT) TABS tablet Take 1 tablet by mouth at bedtime. 30 tablet 0   mupirocin ointment (BACTROBAN) 2 % Apply 1 Application topically 2 (two) times daily.     NATURAL VITAMIN A PO Take 3,000 mcg by mouth daily.     ondansetron (ZOFRAN-ODT) 4 MG disintegrating tablet Take 4 mg by mouth every 8 (eight) hours as needed for nausea or vomiting.  pantoprazole (PROTONIX) 40 MG tablet Take 1 tablet (40 mg total) by mouth daily. 30 tablet 0   PARoxetine (PAXIL) 10 MG tablet Take 10 mg by mouth daily.     polyethylene glycol (MIRALAX / GLYCOLAX) 17 g packet Take 17 g by mouth 2 (two) times daily. (Patient taking differently: Take 17 g by mouth daily as needed for moderate constipation.) 60 each 0   promethazine (PHENERGAN) 12.5 MG tablet Take 1 tablet (12.5 mg total) by mouth every 6 (six) hours as needed for nausea or vomiting. 30 tablet 0   senna-docusate (SENOKOT-S) 8.6-50 MG tablet Take 2 tablets by mouth 2 (two) times daily. (Patient taking differently: Take 2 tablets by mouth 2 (two) times daily as needed for mild constipation.) 120 tablet 0   terazosin (HYTRIN) 1 MG capsule Take 1 mg by mouth at bedtime.     Vitamin D, Ergocalciferol, (DRISDOL) 1.25 MG (50000 UNIT) CAPS capsule Take 1 capsule (50,000 Units total) by mouth every 7 (seven) days. 4 capsule 0   doxycycline (VIBRAMYCIN) 100 MG capsule Take 100 mg by mouth 2 (two) times daily. (Patient not taking: Reported on 01/01/2024)       Scheduled Meds:  amiodarone  100 mg Oral Daily   amLODipine  10 mg Oral Daily   ascorbic acid  500 mg Oral BID   atorvastatin  40 mg Oral QHS   carvedilol  25 mg Oral BID WC   cloNIDine  0.1 mg Oral Daily   finasteride  5 mg Oral Daily   furosemide  40 mg Oral Daily   hydrALAZINE  25 mg Oral TID   insulin aspart  0-6 Units Subcutaneous TID WC   insulin glargine  5 Units Subcutaneous Daily   levETIRAcetam  1,500  mg Oral BID   losartan  50 mg Oral Daily   methocarbamol  500 mg Oral BID   multivitamin  1 tablet Oral QHS   pantoprazole  40 mg Oral Daily   PARoxetine  10 mg Oral Daily   terazosin  1 mg Oral QHS   Continuous Infusions: PRN Meds:.melatonin, polyethylene glycol, senna-docusate  Allergies: No Known Allergies  Family History  Problem Relation Age of Onset   Stroke Mother    Cancer Mother    Heart disease Father     Social History:  reports that he has never smoked. He has never used smokeless tobacco. He reports that he does not currently use alcohol. He reports that he does not use drugs.  ROS: A complete review of systems was performed.  All systems are negative except for pertinent findings as noted.  Physical Exam:  Vital signs in last 24 hours: Temp:  [98.5 F (36.9 C)-98.8 F (37.1 C)] 98.8 F (37.1 C) (02/27 0021) Pulse Rate:  [71-83] 81 (02/27 0600) Resp:  [15-20] 20 (02/27 0600) BP: (111-132)/(66-74) 125/72 (02/27 0600) SpO2:  [99 %-100 %] 100 % (02/27 0600) Weight:  [78 kg] 78 kg (02/26 1644) Constitutional:  Alert and oriented, No acute distress. Resting comfortably. Cardiovascular: No JVD. Respiratory: Normal respiratory effort GI: Abdomen is soft, nontender, nondistended, no abdominal masses GU: 14 Fr SP tube in place.  Urine is pink in tubing, red in bag and draining well. Lymphatic: No lymphadenopathy Psychiatric: Normal mood and affect  Laboratory Data:  Recent Labs    01/03/24 0714 01/05/24 1417 01/05/24 1701 01/06/24 0417  WBC 15.5* 16.1* 15.3* 15.0*  HGB 9.3* 7.8* 7.8* 6.8*  HCT 30.8* 26.0* 25.9* 23.1*  PLT 290 355  337 338    Recent Labs    01/05/24 1701 01/06/24 0417  NA 132* 130*  K 4.5 4.2  CL 97* 98  GLUCOSE 70 91  BUN 26* 30*  CALCIUM 9.2 8.4*  CREATININE 2.07* 2.40*     Results for orders placed or performed during the hospital encounter of 01/05/24 (from the past 24 hours)  Comprehensive metabolic panel     Status:  Abnormal   Collection Time: 01/05/24  5:01 PM  Result Value Ref Range   Sodium 132 (L) 135 - 145 mmol/L   Potassium 4.5 3.5 - 5.1 mmol/L   Chloride 97 (L) 98 - 111 mmol/L   CO2 26 22 - 32 mmol/L   Glucose, Bld 70 70 - 99 mg/dL   BUN 26 (H) 6 - 20 mg/dL   Creatinine, Ser 1.61 (H) 0.61 - 1.24 mg/dL   Calcium 9.2 8.9 - 09.6 mg/dL   Total Protein 6.3 (L) 6.5 - 8.1 g/dL   Albumin 1.6 (L) 3.5 - 5.0 g/dL   AST 25 15 - 41 U/L   ALT 31 0 - 44 U/L   Alkaline Phosphatase 133 (H) 38 - 126 U/L   Total Bilirubin 0.6 0.0 - 1.2 mg/dL   GFR, Estimated 38 (L) >60 mL/min   Anion gap 9 5 - 15  CBC     Status: Abnormal   Collection Time: 01/05/24  5:01 PM  Result Value Ref Range   WBC 15.3 (H) 4.0 - 10.5 K/uL   RBC 2.87 (L) 4.22 - 5.81 MIL/uL   Hemoglobin 7.8 (L) 13.0 - 17.0 g/dL   HCT 04.5 (L) 40.9 - 81.1 %   MCV 90.2 80.0 - 100.0 fL   MCH 27.2 26.0 - 34.0 pg   MCHC 30.1 30.0 - 36.0 g/dL   RDW 91.4 (H) 78.2 - 95.6 %   Platelets 337 150 - 400 K/uL   nRBC 0.0 0.0 - 0.2 %  Type and screen [facility]     Status: None   Collection Time: 01/05/24  5:02 PM  Result Value Ref Range   ABO/RH(D) B POS    Antibody Screen NEG    Sample Expiration      01/08/2024,2359 Performed at [facility], [address] [place], [place] [address]   CBG monitoring, ED     Status: None   Collection Time: 01/06/24  2:29 AM  Result Value Ref Range   Glucose-Capillary 89 70 - 99 mg/dL  Basic metabolic panel     Status: Abnormal   Collection Time: 01/06/24  4:17 AM  Result Value Ref Range   Sodium 130 (L) 135 - 145 mmol/L   Potassium 4.2 3.5 - 5.1 mmol/L   Chloride 98 98 - 111 mmol/L   CO2 22 22 - 32 mmol/L   Glucose, Bld 91 70 - 99 mg/dL   BUN 30 (H) 6 - 20 mg/dL   Creatinine, Ser 6.57 (H) 0.61 - 1.24 mg/dL   Calcium 8.4 (L) 8.9 - 10.3 mg/dL   GFR, Estimated 32 (L) >60 mL/min   Anion gap 10 5 - 15  CBC     Status: Abnormal   Collection Time: 01/06/24  4:17 AM  Result Value Ref  Range   WBC 15.0 (H) 4.0 - 10.5 K/uL   RBC 2.55 (L) 4.22 - 5.81 MIL/uL   Hemoglobin 6.8 (LL) 13.0 - 17.0 g/dL   HCT 84.6 (L) 96.2 - 95.2 %   MCV 90.6 80.0 -  100.0 fL   MCH 26.7 26.0 - 34.0 pg   MCHC 29.4 (L) 30.0 - 36.0 g/dL   RDW 78.2 (H) 95.6 - 21.3 %   Platelets 338 150 - 400 K/uL   nRBC 0.0 0.0 - 0.2 %   Recent Results (from the past 240 hours)  Aerobic Culture w Gram Stain (superficial specimen)     Status: None (Preliminary result)   Collection Time: 01/05/24 10:47 AM   Specimen: Wound  Result Value Ref Range Status   Specimen Description   Final    WOUND SACRUM Performed at [facility], [address], [place], [place] [address]    Special Requests   Final    NONE Performed at [facility], [address], [place], [place] [address]    Gram Stain   Final    RARE WBC PRESENT, PREDOMINANTLY PMN RARE GRAM POSITIVE COCCI Performed at [facility], [address] [place], [place] [address]    Culture PENDING  Incomplete   Report Status PENDING  Incomplete    Renal Function: Recent Labs    12/31/23 1748 01/01/24 0850 01/05/24 1701 01/06/24 0417  CREATININE 2.10* 2.44* 2.07* 2.40*   Estimated Creatinine Clearance: 40.2 mL/min (A) (by C-G formula based on SCr of 2.4 mg/dL (H)).  Radiologic Imaging: IR Radiologist Eval & Mgmt Result Date: 01/05/2024 EXAM: NEW PATIENT OFFICE VISIT CHIEF COMPLAINT: Electronic medical record HISTORY OF PRESENT ILLNESS: Electronic medical record REVIEW OF SYSTEMS: Electronic medical record PHYSICAL EXAMINATION: Electronic medical record ASSESSMENT AND PLAN: Electronic medical record Electronically Signed   By: Gilmer Mor D.O.   On: 01/05/2024 16:20    I independently reviewed the above imaging studies.  Impression/Recommendation 1) Hematuria with acute on chronic anemia: Hematuria does not appear to be severe and will likely be self limited.  Continue to hold anticoagulation, transfuse as  needed (not sure of threshold for transfusion with chronic Hgb 7-8 but may make sense today as he is due for dialysis also).  No indication for urologic intervention or urethral catheterization at this time.  Continue SP to drainage.  Crecencio Mc 01/06/2024, 6:36 AM    Moody Bruins MD   CC: Dr. Corey Harold

## 2024-01-06 NOTE — Assessment & Plan Note (Signed)
 01-06-2024 chronic.

## 2024-01-06 NOTE — Assessment & Plan Note (Addendum)
 01-06-2024 on lantus and SSI. 01-07-2024 CBG stable.  01-08-2024 stable. Pt was eating [employer] during my interview with him.

## 2024-01-06 NOTE — Assessment & Plan Note (Addendum)
 01-06-2024 stable. 01-07-2024 pt is going to remain off ASA due to hematuria. Cannot be on systemic anticoagulants due to prior hemorrhagic CVA in June 2024.  01-08-2024 stable. Stay off ASA for at least 2 weeks.

## 2024-01-06 NOTE — Progress Notes (Signed)
 PROGRESS NOTE    Bruce Little  JXB:[id number] DOB: 01-06-1971 DOA: 01/05/2024 PCP: Bobbye Morton, MD  Subjective: Pt seen and examined. Met with pt and wife [name] at bedside. Pt admitted yesterday due to continued hematuria after IR placed suprapubic tube on 01-03-2024.  Communicated with nephrology who only wanted 1 unit of PRBC transfused today.  Pt seen by urology who wanted to watch and observe pt's hematuria.  Pt had hemorrhagic CVA in June 2024 while he was in [place]. He had already had a left BKA prior to his stroke and was CKD stage 4-5 at that point. Soon after his acute CVA he began HD while in the hospital due to worsening renal failure.  Has been on HD since then.  Wife/pt had suprapubic tube placed due to desire to make it easier for wife to care for patient, less discomfort without a chronic indwelling foley and desire for less UTIs.  Pt has complete paralysis of left UE, left BKA. Minimal movement in right LE. Pt able to use his R UE. Pt is still able to speak and comprehend. Pt still able to eat normally.   Hospital Course: HPI: Bruce Little is a 53 y.o. male with past medical history of intracranial bleed status post craniectomy in [place] in June 2024, ESRD on hemodialysis Tuesday Thursdays Saturday, paroxysmal atrial fibrillation no longer on anticoagulation takes amiodarone, hypertension, diabetes mellitus type 2, chronic anemia who has had a chronic indwelling Foley catheter which was changed to suprapubic catheter on 01/03/2024 and since then has been having persistent hematuria and presents to the ER.  Patient is only on baby aspirin not on any other anticoagulants.  Denies any fever chills.  Had followed up with wound care this morning.  Patient was admitted on 07/31/2024 for anemia and was transfused 1 unit of PRBC.   ED Course: In the ER patient's labs show hemoglobin of 7.8 which is dropped from 9.3 three days ago.  Urologist Dr. Laverle Patter was consulted.  Plan  is to admit and observe and transfuse if there is any further drop in hemoglobin.  Significant Events: Admitted 01/05/2024 for hematuria   Significant Labs: WBC 15.3, Hgb 7.8, Plt 337 Na 132, K 4.5, CO2 of 26, BUN 26, scr 2.07  Significant Imaging Studies:   Antibiotic Therapy: Anti-infectives (From admission, onward)    None       Procedures:   Consultants: IR Urology nephrology    Assessment and Plan: * Gross hematuria 01-06-2024 due to recent suprapubic tube placement. Urology consulted. Defer to urology if he needs cystoscopy.  Acute blood loss anemia (ABLA) 01-06-2024 due to gross hematuria. Giving 1 unit PRBC today. Repeat HgB in AM.  Hyponatremia - chronic. baseline 129-134 01-06-2024 stable. Baseline Na 129-134  ESRD (end stage renal disease) on dialysis (HCC) 01-06-2024 on HD on T, Th, Sat. Nephrology consulted for HD.  Sacral decubitus ulcer 01-06-2024 present on admission.  Wound care consulted. Pt has negative pressure wound vac. Wife states he goes to wound care clinic.  Leukocytosis 01-06-2024 monitor CBC.  Paroxysmal A-fib (HCC) 01-06-2024 stable.  HTN (hypertension) 01-06-2024 stable. On cozaar, clonidine, coreg, hytrin.  DM2 (diabetes mellitus, type 2) (HCC) 01-06-2024 on lantus and SSI.  History of hemorrhagic stroke with residual hemiparesis (HCC) 01-06-2024 chronic. Happened in June 2024 while pt was in [place].  S/P BKA (below knee amputation) unilateral, left (HCC) 01-06-2024 chronic.  DVT prophylaxis: SCDs Start: 01/05/24 2131    Code Status: Full Code Family Communication:  discussed with pt and wife ashley at bedside Disposition Plan: return home Reason for continuing need for hospitalization: PRBC transfusion today. HD today. Urology monitoring hematuria.  Objective: Vitals:   01/06/24 0950 01/06/24 0952 01/06/24 1137 01/06/24 1228  BP: 125/76 (!) 141/78 139/82 135/81  Pulse: 80 79 78 80  Resp: (!) 21 19 18 17    Temp:  98.6 F (37 C) 98.1 F (36.7 C) 98.2 F (36.8 C)  TempSrc:   Oral Oral  SpO2: 100% 100% 98% 99%  Weight:      Height:        Intake/Output Summary (Last 24 hours) at 01/06/2024 1454 Last data filed at 01/06/2024 1228 Gross per 24 hour  Intake 336 ml  Output --  Net 336 ml   Filed Weights   01/05/24 1644  Weight: 78 kg   Examination:  Physical Exam Vitals and nursing note reviewed.  Constitutional:      General: He is not in acute distress.    Appearance: He is not toxic-appearing.     Comments: Appears chronically ill  HENT:     Head: Normocephalic and atraumatic.     Nose: Nose normal.  Cardiovascular:     Rate and Rhythm: Normal rate and regular rhythm.  Pulmonary:     Effort: Pulmonary effort is normal. No respiratory distress.     Breath sounds: Normal breath sounds.  Abdominal:     General: Abdomen is flat. Bowel sounds are normal. There is no distension.  Genitourinary:    Comments: +suprapubic tube. +gross hematuria in urinary bag. Musculoskeletal:     Comments: Left BKA  Wearing prevelon boot on right foot  Skin:    General: Skin is warm and dry.     Capillary Refill: Capillary refill takes less than 2 seconds.  Neurological:     Mental Status: He is alert and oriented to person, place, and time.     Comments: No movement in left BKA, left UE Minimal movement in right LE Able to use his right hand  Pt is AxOx3   Data Reviewed: I have personally reviewed following labs and imaging studies  CBC: Recent Labs  Lab 01/01/24 0850 01/03/24 0714 01/05/24 1417 01/05/24 1701 01/06/24 0417  WBC 13.2* 15.5* 16.1* 15.3* 15.0*  HGB 7.9* 9.3* 7.8* 7.8* 6.8*  HCT 25.6* 30.8* 26.0* 25.9* 23.1*  MCV 88.3 88.3 88.4 90.2 90.6  PLT 369 290 355 337 338   Basic Metabolic Panel: Recent Labs  Lab 12/31/23 1748 01/01/24 0850 01/05/24 1701 01/06/24 0417  NA 134* 133* 132* 130*  K 4.4 4.4 4.5 4.2  CL 98 97* 97* 98  CO2 25 25 26 22   GLUCOSE 117*  101* 70 91  BUN 44* 54* 26* 30*  CREATININE 2.10* 2.44* 2.07* 2.40*  CALCIUM 9.0 8.8* 9.2 8.4*   GFR: Estimated Creatinine Clearance: 40.2 mL/min (A) (by C-G formula based on SCr of 2.4 mg/dL (H)). Liver Function Tests: Recent Labs  Lab 12/31/23 1748 01/05/24 1701  AST 31 25  ALT 42 31  ALKPHOS 142* 133*  BILITOT 0.5 0.6  PROT 6.4* 6.3*  ALBUMIN 1.6* 1.6*   CBG: Recent Labs  Lab 01/03/24 0945 01/06/24 0027 01/06/24 0229 01/06/24 0725 01/06/24 1139  GLUCAP 74 73 89 78 98    Recent Results (from the past 240 hours)  Aerobic Culture w Gram Stain (superficial specimen)     Status: None (Preliminary result)   Collection Time: 01/05/24 10:47 AM   Specimen: Wound  Result  Value Ref Range Status   Specimen Description   Final    WOUND SACRUM Performed at [facility], [address], [place], [place] [address]    Special Requests   Final    NONE Performed at [facility], [address], [place], [place] [address]    Gram Stain   Final    RARE WBC PRESENT, PREDOMINANTLY PMN RARE GRAM POSITIVE COCCI    Culture   Final    TOO YOUNG TO READ Performed at [facility], [address], [place], [place] [address]    Report Status PENDING  Incomplete     Radiology Studies: IR Radiologist Eval & Mgmt Result Date: 01/05/2024 EXAM: NEW PATIENT OFFICE VISIT CHIEF COMPLAINT: Electronic medical record HISTORY OF PRESENT ILLNESS: Electronic medical record REVIEW OF SYSTEMS: Electronic medical record PHYSICAL EXAMINATION: Electronic medical record ASSESSMENT AND PLAN: Electronic medical record Electronically Signed   By: Gilmer Mor D.O.   On: 01/05/2024 16:20    Scheduled Meds:  sodium chloride   Intravenous Once   amiodarone  100 mg Oral Daily   amLODipine  10 mg Oral Daily   ascorbic acid  500 mg Oral BID   atorvastatin  40 mg Oral QHS   carvedilol  25 mg Oral BID WC   [START ON 01/07/2024] Chlorhexidine Gluconate Cloth  6 each  Topical Q0600   cloNIDine  0.1 mg Oral Daily   finasteride  5 mg Oral Daily   furosemide  40 mg Oral Daily   hydrALAZINE  25 mg Oral TID   insulin aspart  0-6 Units Subcutaneous TID WC   insulin glargine  5 Units Subcutaneous Daily   levETIRAcetam  1,500 mg Oral BID   losartan  50 mg Oral Daily   methocarbamol  500 mg Oral BID   multivitamin  1 tablet Oral QHS   pantoprazole  40 mg Oral Daily   PARoxetine  10 mg Oral Daily   terazosin  1 mg Oral QHS   Continuous Infusions:   LOS: 0 days   Time spent: 50 minutes  Carollee Herter, DO  [facility]  01/06/2024, 2:54 PM

## 2024-01-06 NOTE — Assessment & Plan Note (Addendum)
 01-06-2024 on HD on T, Th, Sat. Nephrology consulted for HD. 01-07-2024 continue routine HD.  01-08-2024 continue routine outpatient HD. Pt to f/u with vascular surgery regarding AVF.

## 2024-01-06 NOTE — ED Provider Notes (Signed)
 [place] EMERGENCY DEPARTMENT AT [facility] Provider Note   CSN: [id number] Arrival date & time: 01/05/24  1541     History  Chief Complaint  Patient presents with   low hgb    Bruce Little is a 53 y.o. male.  53 year old male presenting emergency department of anemia and hematuria.  Had suprapubic catheter placed 2 days ago.  Noted blood in Foley bag.  Has chronic anemia was recently admitted and transfused.  Not having symptoms currently.  Denies abdominal pain.  No nausea vomiting        Home Medications Prior to Admission medications   Medication Sig Start Date End Date Taking? Authorizing Provider  acetaminophen (TYLENOL) 500 MG tablet Take 1,000 mg by mouth every 6 (six) hours as needed for mild pain (pain score 1-3) or headache.   Yes [provider]  amiodarone (PACERONE) 100 MG tablet Take 100 mg by mouth daily.   Yes [provider]  amLODipine (NORVASC) 10 MG tablet Take 10 mg by mouth daily.   Yes [provider]  ascorbic acid (VITAMIN C) 500 MG tablet Take 1 tablet (500 mg total) by mouth 2 (two) times daily. 10/02/23  Yes [name], MD  ASHWAGANDHA PO Take 1 Dose by mouth at bedtime. 1 dose- 2 gummies   Yes [provider]  aspirin EC 81 MG tablet Take 81 mg by mouth at bedtime. Swallow whole.   Yes [provider]  atorvastatin (LIPITOR) 40 MG tablet Take 40 mg by mouth at bedtime.   Yes [provider]  Calcium Carb-Cholecalciferol (CALCIUM 500 + D3 PO) Take 1 tablet by mouth daily.   Yes [provider]  carvedilol (COREG) 25 MG tablet Take 1 tablet (25 mg total) by mouth 2 (two) times daily with a meal. 10/02/23  Yes [name], MD  cloNIDine (CATAPRES) 0.1 MG tablet Take 1 tablet (0.1 mg total) by mouth daily. 10/02/23  Yes [name], MD  diphenhydramine-acetaminophen (TYLENOL PM) 25-500 MG TABS tablet Take 2 tablets by mouth at bedtime as needed.   Yes  [provider]  finasteride (PROSCAR) 5 MG tablet Take 5 mg by mouth daily.   Yes [provider]  furosemide (LASIX) 40 MG tablet Take 40 mg by mouth daily.   Yes [provider]  glipiZIDE (GLUCOTROL) 5 MG tablet Take 1 tablet (5 mg total) by mouth daily. 05/21/21 05/08/24 Yes Sheikh, Omair Latif, DO  hydrALAZINE (APRESOLINE) 25 MG tablet Take 25 mg by mouth 3 (three) times daily.   Yes [provider]  LANTUS SOLOSTAR 100 UNIT/ML Solostar Pen Inject 5 Units into the skin daily. Patient taking differently: Inject 5 Units into the skin at bedtime. 10/02/23  Yes [name], MD  levETIRAcetam (KEPPRA) 750 MG tablet Take 1,500 mg by mouth 2 (two) times daily.   Yes [provider]  losartan (COZAAR) 50 MG tablet Take 1 tablet (50 mg total) by mouth daily. 10/02/23  Yes [name], MD  melatonin 5 MG TABS Take 5-10 mg by mouth at bedtime as needed (sleep).   Yes [provider]  methocarbamol (ROBAXIN) 500 MG tablet Take 1 tablet (500 mg total) by mouth every 8 (eight) hours as needed for muscle spasms. Patient taking differently: Take 500 mg by mouth 2 (two) times daily. May take a third 500 mg dose midday as needed for muscle spasms 10/02/23  Yes [name], MD  multivitamin (RENA-VIT) TABS  tablet Take 1 tablet by mouth at bedtime. 10/02/23  Yes [name], MD  mupirocin ointment (BACTROBAN) 2 % Apply 1 Application topically 2 (two) times daily. 12/24/23  Yes [provider]  NATURAL VITAMIN A PO Take 3,000 mcg by mouth daily.   Yes [provider]  ondansetron (ZOFRAN-ODT) 4 MG disintegrating tablet Take 4 mg by mouth every 8 (eight) hours as needed for nausea or vomiting.   Yes [provider]  pantoprazole (PROTONIX) 40 MG tablet Take 1 tablet (40 mg total) by mouth daily. 05/22/21  Yes Sheikh, Omair Latif, DO  PARoxetine (PAXIL) 10 MG tablet Take 10 mg by mouth daily.   Yes  [provider]  polyethylene glycol (MIRALAX / GLYCOLAX) 17 g packet Take 17 g by mouth 2 (two) times daily. Patient taking differently: Take 17 g by mouth daily as needed for moderate constipation. 11/03/23  Yes Osvaldo Shipper, MD  promethazine (PHENERGAN) 12.5 MG tablet Take 1 tablet (12.5 mg total) by mouth every 6 (six) hours as needed for nausea or vomiting. 11/03/23  Yes Osvaldo Shipper, MD  senna-docusate (SENOKOT-S) 8.6-50 MG tablet Take 2 tablets by mouth 2 (two) times daily. Patient taking differently: Take 2 tablets by mouth 2 (two) times daily as needed for mild constipation. 11/03/23  Yes Osvaldo Shipper, MD  terazosin (HYTRIN) 1 MG capsule Take 1 mg by mouth at bedtime.   Yes [provider]  Vitamin D, Ergocalciferol, (DRISDOL) 1.25 MG (50000 UNIT) CAPS capsule Take 1 capsule (50,000 Units total) by mouth every 7 (seven) days. 10/03/23  Yes [name], MD  doxycycline (VIBRAMYCIN) 100 MG capsule Take 100 mg by mouth 2 (two) times daily. Patient not taking: Reported on 01/01/2024 12/24/23   [provider]      Allergies    Patient has no known allergies.    Review of Systems   Review of Systems  Physical Exam Updated Vital Signs BP 116/66   Pulse 77   Temp 98.5 F (36.9 C)   Resp 15   Ht 6\' 2"  (1.88 m)   Wt 78 kg   SpO2 100%   BMI 22.08 kg/m  Physical Exam Vitals and nursing note reviewed.  Constitutional:      Comments: Chronically ill-appearing  HENT:     Mouth/Throat:     Mouth: Mucous membranes are moist.  Eyes:     Conjunctiva/sclera: Conjunctivae normal.  Cardiovascular:     Rate and Rhythm: Normal rate and regular rhythm.  Pulmonary:     Effort: Pulmonary effort is normal.     Breath sounds: Normal breath sounds.  Abdominal:     General: Abdomen is flat. There is no distension.     Tenderness: There is no abdominal tenderness.     Comments: Suprapubic catheter in place.  No erythema or bleeding.   Musculoskeletal:     Right lower leg: No edema.     Left lower leg: No edema.  Skin:    General: Skin is warm and dry.     Capillary Refill: Capillary refill takes less than 2 seconds.  Neurological:     Mental Status: He is alert and oriented to person, place, and time.  Psychiatric:        Mood and Affect: Mood normal.        Behavior: Behavior normal.     ED Results / Procedures / Treatments   Labs (all labs ordered are listed, but only abnormal results are displayed) Labs Reviewed  COMPREHENSIVE METABOLIC PANEL - Abnormal; Notable for the following components:      Result Value   Sodium 132 (*)    Chloride 97 (*)    BUN 26 (*)    Creatinine, Ser 2.07 (*)    Total Protein 6.3 (*)    Albumin 1.6 (*)    Alkaline Phosphatase 133 (*)    GFR, Estimated 38 (*)    All other components within normal limits  CBC - Abnormal; Notable for the following components:   WBC 15.3 (*)    RBC 2.87 (*)    Hemoglobin 7.8 (*)    HCT 25.9 (*)    RDW 18.0 (*)    All other components within normal limits  BASIC METABOLIC PANEL  CBC  TYPE AND SCREEN    EKG None  Radiology IR Radiologist Eval & Mgmt Result Date: 01/05/2024 EXAM: NEW PATIENT OFFICE VISIT CHIEF COMPLAINT: Electronic medical record HISTORY OF PRESENT ILLNESS: Electronic medical record REVIEW OF SYSTEMS: Electronic medical record PHYSICAL EXAMINATION: Electronic medical record ASSESSMENT AND PLAN: Electronic medical record Electronically Signed   By: Gilmer Mor D.O.   On: 01/05/2024 16:20    Procedures Procedures    Medications Ordered in ED Medications  amiodarone (PACERONE) tablet 100 mg (has no administration in time range)  amLODipine (NORVASC) tablet 10 mg (has no administration in time range)  atorvastatin (LIPITOR) tablet 40 mg (has no administration in time range)  carvedilol (COREG) tablet 25 mg (has no administration in time range)  cloNIDine (CATAPRES) tablet 0.1 mg (has no administration in time range)   furosemide (LASIX) tablet 40 mg (has no administration in time range)  hydrALAZINE (APRESOLINE) tablet 25 mg (has no administration in time range)  losartan (COZAAR) tablet 50 mg (has no administration in time range)  terazosin (HYTRIN) capsule 1 mg (has no administration in time range)  PARoxetine (PAXIL) tablet 10 mg (has no administration in time range)  insulin glargine (LANTUS) injection 5 Units (has no administration in time range)  pantoprazole (PROTONIX) EC tablet 40 mg (has no administration in time range)  polyethylene glycol (MIRALAX / GLYCOLAX) packet 17 g (has no administration in time range)  senna-docusate (Senokot-S) tablet 2 tablet (has no administration in time range)  finasteride (PROSCAR) tablet 5 mg (has no administration in time range)  melatonin tablet 5-10 mg (has no administration in time range)  levETIRAcetam (KEPPRA) tablet 1,500 mg (has no administration in time range)  methocarbamol (ROBAXIN) tablet 500 mg (has no administration in time range)  ascorbic acid (VITAMIN C) tablet 500 mg (has no administration in time range)  multivitamin (RENA-VIT) tablet 1 tablet (has no administration in time range)  insulin aspart (novoLOG) injection 0-6 Units (has no administration in time range)    ED Course/ Medical Decision Making/ A&P Clinical Course as of 01/06/24 0004  Wed Jan 05, 2024  1913 Spoke with urology; will consult on patient.  [TY]    Clinical Course User Index [TY] Coral Spikes, DO                                 Medical Decision Making 53 year old male presenting emergency department for anemia and hematuria.  He is afebrile nontachycardic hemodynamically stable.  Does have dark maroon-colored urine in bag.  Foley appears to be working.  Hemoglobin is 7.8.  Discharged recently with hemoglobin slightly above 9.  Simile has no significant metabolic derangements.  He is  ESRD.  No hyperkalemia.  Not having chest pain or shortness of breath.  Given  patient's ongoing bleeding from his suprapubic catheter and borderline hemoglobin we will admit for observation.  Urology consulted; they will see patient in consult.  Amount and/or Complexity of Data Reviewed Independent Historian:     Details: Wife noted recent admission for anemia and that bleeding started last night. External Data Reviewed:     Details: Foley placed by IR Labs:  Decision-making details documented in ED Course. Radiology:  Decision-making details documented in ED Course.    Details: Considered CT scan, however Foley catheter appears to be draining and working appropriately.  Not having abdominal pain.  Soft benign abdomen.  Will forego CT scan at this time. ECG/medicine tests:  Decision-making details documented in ED Course.  Risk Decision regarding hospitalization.         Final Clinical Impression(s) / ED Diagnoses Final diagnoses:  None    Rx / DC Orders ED Discharge Orders     None         Coral Spikes, DO 01/06/24 0004

## 2024-01-06 NOTE — Hospital Course (Addendum)
 HPI: Bruce Little is a 53 y.o. male with past medical history of intracranial bleed status post craniectomy in [place] in June 2024, ESRD on hemodialysis Tuesday Thursdays Saturday, paroxysmal atrial fibrillation no longer on anticoagulation takes amiodarone, hypertension, diabetes mellitus type 2, chronic anemia who has had a chronic indwelling Foley catheter which was changed to suprapubic catheter on 01/03/2024 and since then has been having persistent hematuria and presents to the ER.  Patient is only on baby aspirin not on any other anticoagulants.  Denies any fever chills.  Had followed up with wound care this morning.  Patient was admitted on 07/31/2024 for anemia and was transfused 1 unit of PRBC.   ED Course: In the ER patient's labs show hemoglobin of 7.8 which is dropped from 9.3 three days ago.  Urologist Dr. Laverle Patter was consulted.  Plan is to admit and observe and transfuse if there is any further drop in hemoglobin.  Significant Events: Admitted 01/05/2024 for hematuria   Significant Labs: WBC 15.3, Hgb 7.8, Plt 337 Na 132, K 4.5, CO2 of 26, BUN 26, scr 2.07  Significant Imaging Studies:   Antibiotic Therapy: Anti-infectives (From admission, onward)    None       Procedures:   Consultants: IR Urology Nephrology Infectious Disease

## 2024-01-06 NOTE — Plan of Care (Signed)
   Problem: Education: Goal: Knowledge of General Education information will improve Description: Including pain rating scale, medication(s)/side effects and non-pharmacologic comfort measures Outcome: Completed/Met

## 2024-01-06 NOTE — Plan of Care (Signed)

## 2024-01-06 NOTE — Consult Note (Signed)
 WOC Nurse Consult Note: Reason for Consult: Sacral wound.  Incidentally patient is noted to have a left ischial wound as well, he is followed by the wound care center;Dr. Mikey Bussing. And was last seen yesterday  Patient followed by [facility]; last seen 01/05/24 Wound type: Left ischium; Unstageable Pressure Injury; 5cm x 4cm x 1.1cm with base cover with yellow slough; otherwise pink wound bed Sacrum; Stage 4 Pressure Injury; 6.5cm x 9.1cm x 1.5cm; 70% pink/30% yellow  Pressure Injury POA: Yes Measurement:see above Wound bed: see above Drainage (amount, consistency, odor) see nursing flow sheets Periwound:intact  Dressing procedure/placement/frequency: Cleanse left ischial wound with Vashe [place] # [phone]) pack wound with Vashe damp gauze (kerlix) top with ABD pads, secure with tape Vashe moist gauze to the sacral wound  Change both BID.   Low air loss mattress for moisture management and pressure redistribution Prevalon boots to offload heels in high risk patient  WOC will re-evaluate patient once admitted to floor for reapplication of NPWT dressing inpatient.   Carmelia Tiner [facility], CNS, [employer] [phone]

## 2024-01-06 NOTE — Consult Note (Signed)
 Please note that the [facility] nursing team is utilizing a standardized work plan to manage patient consults. We are triaging consults and will try to see the patients within 48 hours. Wound photos in the patient's chart allow [place] to consult on the patient in the most efficient and timely manner.    Guneet Delpino [facility], CNS, [employer] [phone]

## 2024-01-06 NOTE — Assessment & Plan Note (Addendum)
 01-06-2024 stable. Baseline Na 129-134 01-07-2024 stable. Na 135.  01-08-2024 resolved.

## 2024-01-06 NOTE — Assessment & Plan Note (Signed)
 01-06-2024 chronic. Happened in June 2024 while pt was in [place].

## 2024-01-06 NOTE — Assessment & Plan Note (Addendum)
 01-06-2024 due to gross hematuria. Giving 1 unit PRBC today. Repeat HgB in AM. 01-07-2024 due to hematuria. Hematuria is slowing down. Not as much blood in urinary bag. Urology not planning on any intervention. Urology has signed off. HgB up to 9.3 g/dl after PRBC transfusion. Repeat HgB tomorrow after HD.  01-08-2023 HgB stable at 9.2 g/dl.

## 2024-01-07 DIAGNOSIS — L988 Other specified disorders of the skin and subcutaneous tissue: Secondary | ICD-10-CM

## 2024-01-07 DIAGNOSIS — M4628 Osteomyelitis of vertebra, sacral and sacrococcygeal region: Secondary | ICD-10-CM

## 2024-01-07 DIAGNOSIS — L89159 Pressure ulcer of sacral region, unspecified stage: Secondary | ICD-10-CM | POA: Diagnosis not present

## 2024-01-07 DIAGNOSIS — R310 Gross hematuria: Secondary | ICD-10-CM | POA: Diagnosis not present

## 2024-01-07 DIAGNOSIS — L89154 Pressure ulcer of sacral region, stage 4: Secondary | ICD-10-CM | POA: Diagnosis not present

## 2024-01-07 DIAGNOSIS — D62 Acute posthemorrhagic anemia: Secondary | ICD-10-CM | POA: Diagnosis not present

## 2024-01-07 DIAGNOSIS — N186 End stage renal disease: Secondary | ICD-10-CM | POA: Diagnosis not present

## 2024-01-07 LAB — CBC WITH DIFFERENTIAL/PLATELET
Abs Immature Granulocytes: 0.09 10*3/uL — ABNORMAL HIGH (ref 0.00–0.07)
Basophils Absolute: 0 10*3/uL (ref 0.0–0.1)
Basophils Relative: 0 %
Eosinophils Absolute: 0.1 10*3/uL (ref 0.0–0.5)
Eosinophils Relative: 1 %
HCT: 29.8 % — ABNORMAL LOW (ref 39.0–52.0)
Hemoglobin: 9.3 g/dL — ABNORMAL LOW (ref 13.0–17.0)
Immature Granulocytes: 1 %
Lymphocytes Relative: 11 %
Lymphs Abs: 1.6 10*3/uL (ref 0.7–4.0)
MCH: 27.3 pg (ref 26.0–34.0)
MCHC: 31.2 g/dL (ref 30.0–36.0)
MCV: 87.4 fL (ref 80.0–100.0)
Monocytes Absolute: 0.9 10*3/uL (ref 0.1–1.0)
Monocytes Relative: 6 %
Neutro Abs: 11.9 10*3/uL — ABNORMAL HIGH (ref 1.7–7.7)
Neutrophils Relative %: 81 %
Platelets: 286 10*3/uL (ref 150–400)
RBC: 3.41 MIL/uL — ABNORMAL LOW (ref 4.22–5.81)
RDW: 17.3 % — ABNORMAL HIGH (ref 11.5–15.5)
WBC: 14.6 10*3/uL — ABNORMAL HIGH (ref 4.0–10.5)
nRBC: 0 % (ref 0.0–0.2)

## 2024-01-07 LAB — COMPREHENSIVE METABOLIC PANEL
ALT: 26 U/L (ref 0–44)
AST: 20 U/L (ref 15–41)
Albumin: 1.6 g/dL — ABNORMAL LOW (ref 3.5–5.0)
Alkaline Phosphatase: 135 U/L — ABNORMAL HIGH (ref 38–126)
Anion gap: 13 (ref 5–15)
BUN: 13 mg/dL (ref 6–20)
CO2: 24 mmol/L (ref 22–32)
Calcium: 8.5 mg/dL — ABNORMAL LOW (ref 8.9–10.3)
Chloride: 98 mmol/L (ref 98–111)
Creatinine, Ser: 1.66 mg/dL — ABNORMAL HIGH (ref 0.61–1.24)
GFR, Estimated: 50 mL/min — ABNORMAL LOW (ref >60)
Glucose, Bld: 88 mg/dL (ref 70–99)
Potassium: 4 mmol/L (ref 3.5–5.1)
Sodium: 135 mmol/L (ref 135–145)
Total Bilirubin: 0.6 mg/dL (ref 0.0–1.2)
Total Protein: 6.5 g/dL (ref 6.5–8.1)

## 2024-01-07 LAB — BPAM RBC
Blood Product Expiration Date: 202503282359
ISSUE DATE / TIME: 202502270917
Unit Type and Rh: 7300

## 2024-01-07 LAB — GLUCOSE, CAPILLARY
Glucose-Capillary: 83 mg/dL (ref 70–99)
Glucose-Capillary: 92 mg/dL (ref 70–99)
Glucose-Capillary: 104 mg/dL — ABNORMAL HIGH (ref 70–99)
Glucose-Capillary: 101 mg/dL — ABNORMAL HIGH (ref 70–99)
Glucose-Capillary: 98 mg/dL (ref 70–99)
Glucose-Capillary: 272 mg/dL — ABNORMAL HIGH (ref 70–99)

## 2024-01-07 LAB — TYPE AND SCREEN
ABO/RH(D): B POS
Antibody Screen: NEGATIVE
Unit division: 0

## 2024-01-07 MED ORDER — CHLORHEXIDINE GLUCONATE CLOTH 2 % EX PADS
6.0000 | MEDICATED_PAD | Freq: Every day | CUTANEOUS | Status: DC
  Administered 2024-01-08: 6 via TOPICAL

## 2024-01-07 NOTE — Progress Notes (Signed)
 Bed switched to air overlay when patient returned from hemodialysis. Dressing changed on both ulcers. Alert and oriented x 3. Denies pain or discomfort.

## 2024-01-07 NOTE — Consult Note (Addendum)
 WOC Nurse Consult Note: Reason for Consult:Apply VAC on sacral wound Wound type: Pressure injury stage 4 Pressure Injury POA: Yes Measurement: 6.5cm x 9.1cm x 1.5cm; 70% pink/30% yellow  Wound bed: 90% red, 10% yellow. Drainage (amount, consistency, odor) moderate amount. No odor. Periwound: Intact. Dressing procedure/placement/frequency: Removed old NPWT dressing Cleansed wound with normal saline Periwound skin protected with skin barrier wipe or window framed with drape Skin protected to the hip with VAC drape for foam bridge  Filled wound with 2 piece of black foam. One at 5 o'clock in a tunneling, measuring 5 cm.  Sealed NPWT dressing at HG/159mmHG  Patient not received IV/PO pain medication per bedside nurse prior to dressing change Patient tolerated procedure well  Ok for bedside nurse apply an extra tape if the [facility] start to alarming to cover some leaking.  WOC nurse will continue to provide NPWT dressing changed due to the complexity of the dressing change.    WOC team will follow on TUESDAY.   Please reconsult if further assistance is needed. Thank-you,  Denyse Amass BSN, RN, [employer], WOC  (Pager: [phone])

## 2024-01-07 NOTE — TOC CM/SW Note (Addendum)
 Transition of Care [facility]) - Inpatient Brief Assessment   Patient Details  Name: Bruce Little MRN: [id number] Date of Birth: May 13, 1971  Transition of [facility] CM/SW Contact:    Tom-Johnson, Hershal Coria, RN Phone Number: 01/07/2024, 12:52 PM   Clinical Narrative:  Patient presented to the ED with bleeding from new Suprapubic Catheter which was changed from a chronic Indwelling Foley Catheter and placed on 01/03/24. Patient was recently discharged on 01/01/24 with Anemia, 1U PRBC was transfused.  Patient has hx of Intracranial Bleed s/p Craniectomy in [place] in June 2024, DM, Anemia, ESRD on TTS Outpatient HD schedule and Paroxysmal A-Fib. Patient currently has a Peg tube which is clamped, Wound Vac to Sacrum and Suprapubic Catheter. Urology and WOC following.   From home with wife and two children, has five children in total. Wife is patient's primary caregiver and transports patient to and from his Outpatient Dialysis appointment. Has all necessary DME's at home. PCP is Street, Stephanie Coup, MD and uses [facility] on [address]    Patient active with home health services with [facility], resumption of care order placed and referral called in to [facility] with acceptance noted, info on AVS.   Patient not Medically ready for discharge.  CM will continue to follow as patient progresses with care towards discharge.                  Transition of Care Asessment: Insurance and Status: Insurance coverage has been reviewed Patient has primary care physician: Yes Home environment has been reviewed: Yes Prior level of function:: Dependent- Bedbound Prior/Current Home Services: Current home services (Home health) Social Drivers of Health Review: SDOH reviewed no interventions necessary Readmission risk has been reviewed: Yes Transition of care needs: transition of care needs identified, TOC will continue to follow

## 2024-01-07 NOTE — Progress Notes (Signed)
     Subjective: Pt slept though exam. Meeting he and his wife for the first time here today. Reviewed plan with her and what is and isn't concerning as his hematuria resolves. All questions answered to her satisfaction  Objective: Vital signs in last 24 hours: Temp:  [97.5 F (36.4 C)-98.9 F (37.2 C)] 98.8 F (37.1 C) (02/28 0530) Pulse Rate:  [81-98] 92 (02/28 0735) Resp:  [18-24] 18 (02/28 0530) BP: (119-142)/(61-112) 123/82 (02/28 1200) SpO2:  [95 %-100 %] 97 % (02/28 0735)  Assessment/Plan: #hematuria s/p SPT placement Resolved hematuria. Tubing is clear light tan with some flecks of old blood. Drainage bag contents is darker.  1u PRBC given 2/27. Hgb 6.8-->9.3. Significantly more than the expected increase from a single unit. Possibly spurious reading off of one of the labs. Continue to trend.  Follow up as directed with IR for initial SPT exchange and upsize. Urology will sign off at this time.   Intake/Output from previous day: 02/27 0701 - 02/28 0700 In: 396 [P.O.:60; Blood:336] Out: 2225 [Urine:1225]  Intake/Output this shift: No intake/output data recorded.  Physical Exam:  General: sleeping CV: No cyanosis Lungs: equal chest rise Abdomen: Soft, NTND, no rebound or guarding Gu: pigtail spt in place draining clear tan urine with some flecks of old blood.   Lab Results: Recent Labs    01/05/24 1701 01/06/24 0417 01/07/24 0616  HGB 7.8* 6.8* 9.3*  HCT 25.9* 23.1* 29.8*   BMET Recent Labs    01/06/24 0417 01/07/24 0616  NA 130* 135  K 4.2 4.0  CL 98 98  CO2 22 24  GLUCOSE 91 88  BUN 30* 13  CREATININE 2.40* 1.66*  CALCIUM 8.4* 8.5*     Studies/Results: IR Radiologist Eval & Mgmt Result Date: 01/05/2024 EXAM: NEW PATIENT OFFICE VISIT CHIEF COMPLAINT: Electronic medical record HISTORY OF PRESENT ILLNESS: Electronic medical record REVIEW OF SYSTEMS: Electronic medical record PHYSICAL EXAMINATION: Electronic medical record ASSESSMENT AND PLAN:  Electronic medical record Electronically Signed   By: Gilmer Mor D.O.   On: 01/05/2024 16:20      LOS: 0 days   Elmon Kirschner, NP [facility] Pager: [phone]  01/07/2024, 1:35 PM

## 2024-01-07 NOTE — Plan of Care (Signed)
  Problem: Health Behavior/Discharge Planning: Goal: Ability to manage health-related needs will improve Outcome: Progressing   Problem: Clinical Measurements: Goal: Ability to maintain clinical measurements within normal limits will improve Outcome: Progressing Goal: Will remain free from infection Outcome: Progressing Goal: Diagnostic test results will improve Outcome: Progressing Goal: Respiratory complications will improve Outcome: Progressing Goal: Cardiovascular complication will be avoided Outcome: Progressing   Problem: Activity: Goal: Risk for activity intolerance will decrease Outcome: Progressing   Problem: Nutrition: Goal: Adequate nutrition will be maintained Outcome: Progressing   Problem: Coping: Goal: Level of anxiety will decrease Outcome: Progressing   Problem: Elimination: Goal: Will not experience complications related to bowel motility Outcome: Progressing Goal: Will not experience complications related to urinary retention Outcome: Progressing   Problem: Pain Managment: Goal: General experience of comfort will improve and/or be controlled Outcome: Progressing   Problem: Safety: Goal: Ability to remain free from injury will improve Outcome: Progressing   Problem: Skin Integrity: Goal: Risk for impaired skin integrity will decrease Outcome: Progressing   Problem: Education: Goal: Ability to describe self-care measures that may prevent or decrease complications (Diabetes Survival Skills Education) will improve Outcome: Progressing Goal: Individualized Educational Video(s) Outcome: Progressing   Problem: Coping: Goal: Ability to adjust to condition or change in health will improve Outcome: Progressing   Problem: Fluid Volume: Goal: Ability to maintain a balanced intake and output will improve Outcome: Progressing   Problem: Health Behavior/Discharge Planning: Goal: Ability to identify and utilize available resources and services will  improve Outcome: Progressing Goal: Ability to manage health-related needs will improve Outcome: Progressing   Problem: Metabolic: Goal: Ability to maintain appropriate glucose levels will improve Outcome: Progressing   Problem: Nutritional: Goal: Maintenance of adequate nutrition will improve Outcome: Progressing Goal: Progress toward achieving an optimal weight will improve Outcome: Progressing   Problem: Skin Integrity: Goal: Risk for impaired skin integrity will decrease Outcome: Progressing   Problem: Tissue Perfusion: Goal: Adequacy of tissue perfusion will improve Outcome: Progressing   Problem: Education: Goal: Knowledge of disease and its progression will improve Outcome: Progressing Goal: Individualized Educational Video(s) Outcome: Progressing   Problem: Fluid Volume: Goal: Compliance with measures to maintain balanced fluid volume will improve Outcome: Progressing   Problem: Health Behavior/Discharge Planning: Goal: Ability to manage health-related needs will improve Outcome: Progressing   Problem: Nutritional: Goal: Ability to make healthy dietary choices will improve Outcome: Progressing   Problem: Clinical Measurements: Goal: Complications related to the disease process, condition or treatment will be avoided or minimized Outcome: Progressing

## 2024-01-07 NOTE — Plan of Care (Signed)
   Problem: Health Behavior/Discharge Planning: Goal: Ability to manage health-related needs will improve Outcome: Completed/Met

## 2024-01-07 NOTE — Progress Notes (Signed)
 [facility] Progress Note  Name: Bruce Little MRN: [id number] DOB: [birth year]   Subjective:  Last HD on 2/27 with 1 kg UF.  Spoke with his wife at bedside.  She states that he had HD late at night and has been asleep most recently.  Days/nights are reversed a bit.  This is a new suprapubic catheter.  He has an appt for eval for a fistula soon - dialyzing via catheter.  She thinks they are planning for discharge tomorrow.   Review of systems:  He is asleep - does not wake with my exam     Intake/Output Summary (Last 24 hours) at 01/07/2024 1325 Last data filed at 01/07/2024 0600 Gross per 24 hour  Intake 60 ml  Output 1925 ml  Net -1865 ml    Vitals:  Vitals:   01/07/24 0154 01/07/24 0530 01/07/24 0735 01/07/24 1200  BP: 137/80 132/77 133/78 123/82  Pulse: 98 96 92   Resp:  18    Temp: 98.9 F (37.2 C) 98.8 F (37.1 C)    TempSrc: Oral Oral    SpO2: 97% 97% 97%   Weight:      Height:         Physical Exam:  General adult male in bed in no acute distress HEENT normocephalic atraumatic extraocular movements intact sclera anicteric Neck supple trachea midline Lungs clear to auscultation bilaterally normal work of breathing at rest on room air  Heart S1S2 no rub Abdomen soft nontender nondistended Extremities no edema  Neuro he is asleep and does not wake with my exam  Access RIJ tunn catheter in place   Medications reviewed   Labs:     Latest Ref Rng & Units 01/07/2024    6:16 AM 01/06/2024    4:17 AM 01/05/2024    5:01 PM  BMP  Glucose 70 - 99 mg/dL 88  91  70   BUN 6 - 20 mg/dL 13  30  26    Creatinine 0.61 - 1.24 mg/dL 4.09  8.11  9.14   Sodium 135 - 145 mmol/L 135  130  132   Potassium 3.5 - 5.1 mmol/L 4.0  4.2  4.5   Chloride 98 - 111 mmol/L 98  98  97   CO2 22 - 32 mmol/L 24  22  26    Calcium 8.9 - 10.3 mg/dL 8.5  8.4  9.2    Dialysis orders: TTS 2nd shift @ [facility]  Time 4hrs, 160NRe EDW 77.5kg (left at 76.2kg last treatment) 500/1.5 3/2  bath Mircera 225 mcg q. 2 weeks last given on February 25 Hectorol IV three times per week (on hold)     Assessment/Plan:   # ESRD on HD  - HD per TTS schedule  - Seems to have lost weight per initial consult   # Hematuria  - chronic indwelling foley recently changed to a suprapubic catheter - per urology and primary team   # Anemia of acute blood loss - s/p PRBC's with improvement - PRBC's per primary team   - note ESA given 2/25 outpatient   # Afib  - Per primary team; on amio  # Metabolic bone disease  - hectorol is listed as on hold above    Disposition - per primary team   Estanislado Emms, MD 01/07/2024 1:25 PM

## 2024-01-07 NOTE — Progress Notes (Signed)
 PROGRESS NOTE    Bruce Little  QIO:[id number] DOB: [birth year] DOA: 01/05/2024 PCP: Bobbye Morton, MD  Subjective: Pt seen and examined. Wife [name] at bedside. After 1 unit PRBC transfusion, HgB up to 9.3 g/dl(was 6.8 g/dl prior to transfusion).  Hematuria is slowing down. Not as much blood in urinary bag. Urology not planning on any intervention.  Urology has signed off.  Nephrology involved with his routine HD.  Wife needs letter excusing him from jury duty.   Hospital Course: HPI: Bruce Little is a 53 y.o. male with past medical history of intracranial bleed status post craniectomy in [place] in June 2024, ESRD on hemodialysis Tuesday Thursdays Saturday, paroxysmal atrial fibrillation no longer on anticoagulation takes amiodarone, hypertension, diabetes mellitus type 2, chronic anemia who has had a chronic indwelling Foley catheter which was changed to suprapubic catheter on 01/03/2024 and since then has been having persistent hematuria and presents to the ER.  Patient is only on baby aspirin not on any other anticoagulants.  Denies any fever chills.  Had followed up with wound care this morning.  Patient was admitted on 07/31/2024 for anemia and was transfused 1 unit of PRBC.   ED Course: In the ER patient's labs show hemoglobin of 7.8 which is dropped from 9.3 three days ago.  Urologist Dr. Laverle Patter was consulted.  Plan is to admit and observe and transfuse if there is any further drop in hemoglobin.  Significant Events: Admitted 01/05/2024 for hematuria   Significant Labs: WBC 15.3, Hgb 7.8, Plt 337 Na 132, K 4.5, CO2 of 26, BUN 26, scr 2.07  Significant Imaging Studies:   Antibiotic Therapy: Anti-infectives (From admission, onward)    None       Procedures:   Consultants: IR Urology nephrology    Assessment and Plan: * Gross hematuria 01-06-2024 due to recent suprapubic tube placement. Urology consulted. Defer to urology if he needs  cystoscopy.  01-07-2024 Hematuria is slowing down. Not as much blood in urinary bag. Urology not planning on any intervention. Urology has signed off.   Acute blood loss anemia (ABLA) 01-06-2024 due to gross hematuria. Giving 1 unit PRBC today. Repeat HgB in AM.  01-07-2024 due to hematuria. Hematuria is slowing down. Not as much blood in urinary bag. Urology not planning on any intervention. Urology has signed off. HgB up to 9.3 g/dl after PRBC transfusion. Repeat HgB tomorrow after HD.  Sacral decubitus ulcer 01-06-2024 present on admission.  Wound care consulted. Pt has negative pressure wound vac. Wife states he goes to wound care clinic.  01-07-2024 has acute osteo of sacrum. ID consulted. Stage 4 wound.   Sacral osteomyelitis (HCC) 01-07-2024 had sacral bone biopsy performed on 01-05-2024 by wound care MD.  Pathology came back as acute osteomyelitis. Wife requesting ID consult.  Hyponatremia - chronic. baseline 129-134 01-06-2024 stable. Baseline Na 129-134  01-07-2024 stable. Na 135.  ESRD (end stage renal disease) on dialysis (HCC) 01-06-2024 on HD on T, Th, Sat. Nephrology consulted for HD.  01-07-2024 continue routine HD.  Leukocytosis 01-06-2024 monitor CBC.  01-07-2024 WBC stable at 15K. ID consulted for his acute sacral osteo.  Paroxysmal A-fib (HCC) - Cannot be on systemic anticoagulants due to prior hemorrhagic CVA in June 2024. 01-06-2024 stable.  01-07-2024 pt is going to remain off ASA due to hematuria. Cannot be on systemic anticoagulants due to prior hemorrhagic CVA in June 2024.  HTN (hypertension) 01-06-2024 stable. On cozaar, clonidine, coreg, hytrin.  01-07-2024 HTN well controlled.  DM2 (diabetes mellitus, type 2) (HCC) 01-06-2024 on lantus and SSI.  01-07-2024 CBG stable.  History of hemorrhagic stroke with residual hemiparesis (HCC) 01-06-2024 chronic. Happened in June 2024 while pt was in [place].  S/P BKA (below knee amputation)  unilateral, left (HCC) 01-06-2024 chronic.   DVT prophylaxis: SCDs Start: 01/05/24 2131    Code Status: Full Code Family Communication: discussed with pt's wife at bedside. Letter to excuse pt from jury duty given to wife. Disposition Plan: return home Reason for continuing need for hospitalization: monitoring hematuria, HgB. Awaiting ID consult about sacral osteo.  Objective: Vitals:   01/07/24 0154 01/07/24 0530 01/07/24 0735 01/07/24 1200  BP: 137/80 132/77 133/78 123/82  Pulse: 98 96 92   Resp:  18    Temp: 98.9 F (37.2 C) 98.8 F (37.1 C)    TempSrc: Oral Oral    SpO2: 97% 97% 97%   Weight:      Height:        Intake/Output Summary (Last 24 hours) at 01/07/2024 1431 Last data filed at 01/07/2024 0600 Gross per 24 hour  Intake 60 ml  Output 1925 ml  Net -1865 ml   Filed Weights   01/05/24 1644  Weight: 78 kg   Examination:  Physical Exam Vitals and nursing note reviewed.  Constitutional:      General: He is not in acute distress.    Comments: Awake, chronically ill appearing  HENT:     Head:     Comments: Evidence of Prior craniotomy Cardiovascular:     Rate and Rhythm: Normal rate and regular rhythm.  Pulmonary:     Effort: Pulmonary effort is normal.     Breath sounds: Normal breath sounds.  Abdominal:     General: Bowel sounds are normal.     Palpations: Abdomen is soft.  Musculoskeletal:     Comments: Left BKA  Skin:    Capillary Refill: Capillary refill takes less than 2 seconds.     Comments: See picture of sacral wound, right leg wound  Neurological:     Mental Status: He is oriented to person, place, and time.     Comments: Paresis of left UE, left BKA/left LE Minimal movement in right LE Able to move his right UE.       Data Reviewed: I have personally reviewed following labs and imaging studies  CBC: Recent Labs  Lab 01/03/24 0714 01/05/24 1417 01/05/24 1701 01/06/24 0417 01/07/24 0616  WBC 15.5* 16.1* 15.3* 15.0* 14.6*   NEUTROABS  --   --   --   --  11.9*  HGB 9.3* 7.8* 7.8* 6.8* 9.3*  HCT 30.8* 26.0* 25.9* 23.1* 29.8*  MCV 88.3 88.4 90.2 90.6 87.4  PLT 290 355 337 338 286   Basic Metabolic Panel: Recent Labs  Lab 12/31/23 1748 01/01/24 0850 01/05/24 1701 01/06/24 0417 01/07/24 0616  NA 134* 133* 132* 130* 135  K 4.4 4.4 4.5 4.2 4.0  CL 98 97* 97* 98 98  CO2 25 25 26 22 24   GLUCOSE 117* 101* 70 91 88  BUN 44* 54* 26* 30* 13  CREATININE 2.10* 2.44* 2.07* 2.40* 1.66*  CALCIUM 9.0 8.8* 9.2 8.4* 8.5*   GFR: Estimated Creatinine Clearance: 58.1 mL/min (A) (by C-G formula based on SCr of 1.66 mg/dL (H)). Liver Function Tests: Recent Labs  Lab 12/31/23 1748 01/05/24 1701 01/07/24 0616  AST 31 25 20   ALT 42 31 26  ALKPHOS 142* 133* 135*  BILITOT 0.5 0.6 0.6  PROT  6.4* 6.3* 6.5  ALBUMIN 1.6* 1.6* 1.6*   CBG: Recent Labs  Lab 01/06/24 2048 01/07/24 0209 01/07/24 0735 01/07/24 1204 01/07/24 1344  GLUCAP 113* 83 92 104* 101*   Recent Results (from the past 240 hours)  Aerobic Culture w Gram Stain (superficial specimen)     Status: None (Preliminary result)   Collection Time: 01/05/24 10:47 AM   Specimen: Wound  Result Value Ref Range Status   Specimen Description   Final    WOUND SACRUM Performed at [facility], [address] [place], [place] [address]    Special Requests   Final    NONE Performed at [facility], [address] [place], [place] [address]    Gram Stain   Final    RARE WBC PRESENT, PREDOMINANTLY PMN RARE GRAM POSITIVE COCCI    Culture   Final    CULTURE REINCUBATED FOR BETTER GROWTH Performed at [facility], [address], [place], [place] [address]    Report Status PENDING  Incomplete    Radiology Studies: IR Radiologist Eval & Mgmt Result Date: 01/05/2024 EXAM: NEW PATIENT OFFICE VISIT CHIEF COMPLAINT: Electronic medical record HISTORY OF PRESENT ILLNESS: Electronic medical record REVIEW OF SYSTEMS:  Electronic medical record PHYSICAL EXAMINATION: Electronic medical record ASSESSMENT AND PLAN: Electronic medical record Electronically Signed   By: Gilmer Mor D.O.   On: 01/05/2024 16:20   Scheduled Meds:  sodium chloride   Intravenous Once   amiodarone  100 mg Oral Daily   amLODipine  10 mg Oral Daily   ascorbic acid  500 mg Oral BID   atorvastatin  40 mg Oral QHS   carvedilol  25 mg Oral BID WC   Chlorhexidine Gluconate Cloth  6 each Topical Q0600   cloNIDine  0.1 mg Oral Daily   finasteride  5 mg Oral Daily   furosemide  40 mg Oral Daily   hydrALAZINE  25 mg Oral TID   insulin aspart  0-6 Units Subcutaneous TID WC   insulin glargine  5 Units Subcutaneous Daily   levETIRAcetam  1,500 mg Oral BID   losartan  50 mg Oral Daily   methocarbamol  500 mg Oral BID   multivitamin  1 tablet Oral QHS   pantoprazole  40 mg Oral Daily   PARoxetine  10 mg Oral Daily   terazosin  1 mg Oral QHS   Continuous Infusions:   LOS: 0 days   Time spent: 45 minutes  Carollee Herter, DO  [facility]  01/07/2024, 2:31 PM

## 2024-01-07 NOTE — Consult Note (Signed)
 [facility]    Date of Admission:  01/05/2024     Reason for Consult: acute om sacral ulcer    Referring Provider: Carollee Herter     Lines:  Right chest hd catheter  Abx: 2/26 doxycycline rx    Assessment: Hx ich 05/2023 right side; left side hemiplegia Sacral ulcer since 05/2023 admission -- was treated with abx several weeks there for morganella, ecoli, enterococcus Hx cdiff during 05/2023 admission after abx introduction Other complication during the 05/2023 admission --> aki requiring dialysis; right chest permacath (exchanged 12/28/23) S/p peg/trach S/p suprapubic foley catheter placement 01/03/24 S/p left bka Dm2 Hx afib no longer on anticoagulation  Patient 53 yo male with above comorbidities admitted 01/05/24 for hematuria   Seeing wound care 2/26; bx sacral bone show changes suggestive of acute om; cx gpc. Given doxy starting 2/26. Wife requested id consult  Urology following for hematuria   Current id issue: Chronic sacral decub; path acute om He doesn't have sepsis  Doesn't appear to have significant cellulitis change -- right buttock picture 2/28 showed an eschar; he does have a sacral wound as well with wound vac on  Underlying decub ulcer is due to irreversible condition, and there is no role for long term antibiotics, especially with prior cdiff, given abx side effect, low cure rate/high relapse. Often om treatment would require (to maximize healing), diverting colostomy/urethra and a flap coverage of the open wound which are not available here at our medical center. Even with this, the recurrent rate is high  His doxycycline was started by wound center - I recommend if cellulitis purpose wouldn't treat beyond 2 weeks    Discuss plan extensively with patient's wife, who agrees  Plan: Defer doxy to primary team/wound center  I do not recommend prolong treatment; he certainly doesn't  need iv abx Continue wound care Id  desire to get full om treatment would advise outpatient plastic surgery/general surgery to arrange full I&D with deep bone culture, diverting colostomy, planned flap coverage around 2-3 weeks into abx treatment ID will sign off Standard precaution for isolation Discussed with primary team      ------------------------------------------------ Principal Problem:   Gross hematuria Active Problems:   DM2 (diabetes mellitus, type 2) (HCC)   HTN (hypertension)   Paroxysmal A-fib (HCC)   Leukocytosis   Sacral decubitus ulcer   Acute blood loss anemia (ABLA)   ESRD (end stage renal disease) on dialysis (HCC)   S/P BKA (below knee amputation) unilateral, left (HCC)   History of hemorrhagic stroke with residual hemiparesis (HCC)   Hyponatremia - chronic. baseline 129-134    HPI: Bruce Little is a 53 y.o. male hx stroke, left hemiplegia, chronic sacral decub, admitted for suprapubic site bleeding  Patient has been seeing wound care Given augmentin course and doxy course each ?7 days recently for right lower blister  Followed here by urology  Had sacral biopsy in wound clinic showing acute om  Per wife his wound size improving with wound care  No prior prolonged abx course for sacral process outside of summer 2024 when he was in [place] [facility] for stroke, decub ulcer, and cdiff  No other issue    Family History  Problem Relation Age of Onset   Stroke Mother    Cancer Mother    Heart disease Father     Social History   Tobacco Use   Smoking status: Never   Smokeless tobacco:  Never  Vaping Use   Vaping status: Never Used  Substance Use Topics   Alcohol use: Not Currently   Drug use: Never    No Known Allergies  Review of Systems: ROS All Other ROS was negative, except mentioned above   Past Medical History:  Diagnosis Date   A-fib (HCC) 07/03/2021   Acute ischemic stroke (HCC) 05/20/2021   Anemia    low iron   Anxiety    Below-knee amputation of left  lower extremity (HCC) 11/05/2021   Chronic kidney disease    prorgression to ESRD 05/03/2023   CKD (chronic kidney disease) stage 5, GFR less than 15 ml/min (HCC) 07/02/2021   COVID    has had it 2 times, one mild and one wasn't   Depression    DM2 (diabetes mellitus, type 2) (HCC)    Family history of adverse reaction to anesthesia    Dad has a "hard time waking up" after anesthesia   GERD (gastroesophageal reflux disease)    Hemorrhagic stroke (HCC) 04/30/2023   s/p right decompressive craniectomy and evacuation of hematoma on 04/30/2023   History of blood transfusion    HTN (hypertension)    ICH (intracerebral hemorrhage) (HCC) 09/17/2023   Osteomyelitis of fifth toe of left foot (HCC) 08/13/2021   Osteomyelitis of fourth toe of left foot (HCC) 08/13/2021   Pneumonia    Sacral decubitus ulcer 05/2023   Stroke (HCC) 05/19/2021   unable to move left side       Scheduled Meds:  sodium chloride   Intravenous Once   amiodarone  100 mg Oral Daily   amLODipine  10 mg Oral Daily   ascorbic acid  500 mg Oral BID   atorvastatin  40 mg Oral QHS   carvedilol  25 mg Oral BID WC   Chlorhexidine Gluconate Cloth  6 each Topical Q0600   cloNIDine  0.1 mg Oral Daily   finasteride  5 mg Oral Daily   furosemide  40 mg Oral Daily   hydrALAZINE  25 mg Oral TID   insulin aspart  0-6 Units Subcutaneous TID WC   insulin glargine  5 Units Subcutaneous Daily   levETIRAcetam  1,500 mg Oral BID   losartan  50 mg Oral Daily   methocarbamol  500 mg Oral BID   multivitamin  1 tablet Oral QHS   pantoprazole  40 mg Oral Daily   PARoxetine  10 mg Oral Daily   terazosin  1 mg Oral QHS   Continuous Infusions: PRN Meds:.melatonin, ondansetron, polyethylene glycol, senna-docusate   OBJECTIVE: Blood pressure 123/82, pulse 92, temperature 98.8 F (37.1 C), temperature source Oral, resp. rate 18, height 6\' 2"  (1.88 m), weight 78 kg, SpO2 97%.  Physical Exam General/constitutional: sleeping; hx  discussed with wife HEENT: Normocephalic Neck supple CV: rrr no mrg Lungs: clear to auscultation, normal respiratory effort Abd: Soft, Nontender; peg tube in place Ext: no edema Skin: review wound pictures Neuro: left hemiplegia MSK: no peripheral joint swelling/tenderness/warmth   Lab Results Lab Results  Component Value Date   WBC 14.6 (H) 01/07/2024   HGB 9.3 (L) 01/07/2024   HCT 29.8 (L) 01/07/2024   MCV 87.4 01/07/2024   PLT 286 01/07/2024    Lab Results  Component Value Date   CREATININE 1.66 (H) 01/07/2024   BUN 13 01/07/2024   NA 135 01/07/2024   K 4.0 01/07/2024   CL 98 01/07/2024   CO2 24 01/07/2024    Lab Results  Component Value Date  ALT 26 01/07/2024   AST 20 01/07/2024   ALKPHOS 135 (H) 01/07/2024   BILITOT 0.6 01/07/2024      Microbiology: Recent Results (from the past 240 hours)  Aerobic Culture w Gram Stain (superficial specimen)     Status: None (Preliminary result)   Collection Time: 01/05/24 10:47 AM   Specimen: Wound  Result Value Ref Range Status   Specimen Description   Final    WOUND SACRUM Performed at [facility], [address], [place], [place] [address]    Special Requests   Final    NONE Performed at [facility], [address] [place], [place] [address]    Gram Stain   Final    RARE WBC PRESENT, PREDOMINANTLY PMN RARE GRAM POSITIVE COCCI    Culture   Final    CULTURE REINCUBATED FOR BETTER GROWTH Performed at [facility], [address], [place], [place] [address]    Report Status PENDING  Incomplete     Serology:    Imaging: If present, new imagings (plain films, ct scans, and mri) have been personally visualized and interpreted; radiology reports have been reviewed. Decision making incorporated into the Impression / Recommendations.    Raymondo Band, MD [facility] [phone] pager    01/07/2024, 1:26 PM

## 2024-01-07 NOTE — Assessment & Plan Note (Addendum)
 01-07-2024 had sacral bone biopsy performed on 01-05-2024 by wound care MD.  Pathology came back as acute osteomyelitis. Wife requesting ID consult.  01-08-2024 pt seen by ID. Dr. Renold Don did not recommend starting PO or IV abx. F/u with wound care clinic.

## 2024-01-07 NOTE — Care Management Obs Status (Signed)
 MEDICARE OBSERVATION STATUS NOTIFICATION   Patient Details  Name: Bruce Little MRN: [id number] Date of Birth: [birth year]   Medicare Observation Status Notification Given:  Yes  Patient's daughter, Pershing Proud requested letter emailed to her at [email or web address]. Letter emailed by [employer].    Tom-Johnson, Hershal Coria, RN 01/07/2024, 2:13 PM

## 2024-01-08 DIAGNOSIS — R310 Gross hematuria: Secondary | ICD-10-CM | POA: Diagnosis not present

## 2024-01-08 DIAGNOSIS — Z9359 Other cystostomy status: Secondary | ICD-10-CM

## 2024-01-08 DIAGNOSIS — D62 Acute posthemorrhagic anemia: Secondary | ICD-10-CM | POA: Diagnosis not present

## 2024-01-08 DIAGNOSIS — L89154 Pressure ulcer of sacral region, stage 4: Secondary | ICD-10-CM | POA: Diagnosis not present

## 2024-01-08 DIAGNOSIS — N186 End stage renal disease: Secondary | ICD-10-CM | POA: Diagnosis not present

## 2024-01-08 LAB — HEPATITIS B SURFACE ANTIBODY, QUANTITATIVE: Hep B S AB Quant (Post): <3.5 m[IU]/mL — ABNORMAL LOW

## 2024-01-08 LAB — CBC
HCT: 30 % — ABNORMAL LOW (ref 39.0–52.0)
Hemoglobin: 9.2 g/dL — ABNORMAL LOW (ref 13.0–17.0)
MCH: 27.5 pg (ref 26.0–34.0)
MCHC: 30.7 g/dL (ref 30.0–36.0)
MCV: 89.8 fL (ref 80.0–100.0)
Platelets: 272 10*3/uL (ref 150–400)
RBC: 3.34 MIL/uL — ABNORMAL LOW (ref 4.22–5.81)
RDW: 18.1 % — ABNORMAL HIGH (ref 11.5–15.5)
WBC: 14.6 10*3/uL — ABNORMAL HIGH (ref 4.0–10.5)
nRBC: 0 % (ref 0.0–0.2)

## 2024-01-08 LAB — RENAL FUNCTION PANEL
Albumin: 1.7 g/dL — ABNORMAL LOW (ref 3.5–5.0)
Anion gap: 11 (ref 5–15)
BUN: 18 mg/dL (ref 6–20)
CO2: 26 mmol/L (ref 22–32)
Calcium: 8.7 mg/dL — ABNORMAL LOW (ref 8.9–10.3)
Chloride: 98 mmol/L (ref 98–111)
Creatinine, Ser: 2.44 mg/dL — ABNORMAL HIGH (ref 0.61–1.24)
GFR, Estimated: 31 mL/min — ABNORMAL LOW (ref >60)
Glucose, Bld: 103 mg/dL — ABNORMAL HIGH (ref 70–99)
Phosphorus: 2.6 mg/dL (ref 2.5–4.6)
Potassium: 3.7 mmol/L (ref 3.5–5.1)
Sodium: 135 mmol/L (ref 135–145)

## 2024-01-08 LAB — MRSA NEXT GEN BY PCR, NASAL: MRSA by PCR Next Gen: DETECTED — AB

## 2024-01-08 LAB — GLUCOSE, CAPILLARY
Glucose-Capillary: 111 mg/dL — ABNORMAL HIGH (ref 70–99)
Glucose-Capillary: 82 mg/dL (ref 70–99)

## 2024-01-08 MED ORDER — CHLORHEXIDINE GLUCONATE CLOTH 2 % EX PADS
6.0000 | MEDICATED_PAD | Freq: Every day | CUTANEOUS | Status: DC
  Administered 2024-01-08: 6 via TOPICAL

## 2024-01-08 MED ORDER — ALTEPLASE 2 MG IJ SOLR: 2.0000 mg | Freq: Once | INTRAMUSCULAR | Status: DC | PRN

## 2024-01-08 MED ORDER — HEPARIN SODIUM (PORCINE) 1000 UNIT/ML IJ SOLN
3800.0000 [IU] | Freq: Once | INTRAMUSCULAR | Status: AC
  Administered 2024-01-08: 3800 [IU]
  Filled 2024-01-08: qty 4

## 2024-01-08 MED ORDER — MUPIROCIN 2 % EX OINT
1.0000 | TOPICAL_OINTMENT | Freq: Two times a day (BID) | CUTANEOUS | Status: DC
  Administered 2024-01-08: 1 via NASAL
  Filled 2024-01-08: qty 22

## 2024-01-08 MED ORDER — HEPARIN SODIUM (PORCINE) 1000 UNIT/ML IJ SOLN
INTRAMUSCULAR | Status: AC
  Filled 2024-01-08: qty 4

## 2024-01-08 MED ORDER — PENTAFLUOROPROP-TETRAFLUOROETH EX AERO: 1.0000 | INHALATION_SPRAY | CUTANEOUS | Status: DC | PRN

## 2024-01-08 MED ORDER — TRAZODONE HCL 100 MG PO TABS: 100.0000 mg | ORAL_TABLET | Freq: Every evening | ORAL | 0 refills | Status: DC | PRN

## 2024-01-08 MED ORDER — PROMETHAZINE HCL 25 MG RE SUPP: 25.0000 mg | Freq: Four times a day (QID) | RECTAL | 0 refills | Status: AC | PRN

## 2024-01-08 MED ORDER — LIDOCAINE HCL (PF) 1 % IJ SOLN: 5.0000 mL | INTRAMUSCULAR | Status: DC | PRN

## 2024-01-08 MED ORDER — ANTICOAGULANT SODIUM CITRATE 4% (200MG/5ML) IV SOLN
5.0000 mL | Status: DC | PRN
  Filled 2024-01-08: qty 5

## 2024-01-08 MED ORDER — LIDOCAINE-PRILOCAINE 2.5-2.5 % EX CREA: 1.0000 | TOPICAL_CREAM | CUTANEOUS | Status: DC | PRN

## 2024-01-08 MED ORDER — PROMETHAZINE HCL 12.5 MG PO TABS: 12.5000 mg | ORAL_TABLET | Freq: Four times a day (QID) | ORAL | 0 refills | Status: DC | PRN

## 2024-01-08 MED ORDER — HEPARIN SODIUM (PORCINE) 1000 UNIT/ML DIALYSIS: 1000.0000 [IU] | INTRAMUSCULAR | Status: DC | PRN

## 2024-01-08 NOTE — Progress Notes (Signed)
 [facility] Progress Note  Name: Bruce Little MRN: [id number] DOB: [birth year]   Subjective:  Seen and examined on dialysis.  Procedure supervised.  RIJ Tunneled catheter in use.  Blood pressure 134/85 and HR 83.  Tolerating goal.  He had 400 mL uop over 2/28 charted.  He nods that he feels like himself/back to baseline.   Review of systems:  He denies any shortness of breath or chest pain Denies nausea or vomiting     Intake/Output Summary (Last 24 hours) at 01/08/2024 1021 Last data filed at 01/08/2024 0600 Gross per 24 hour  Intake 240 ml  Output 450 ml  Net -210 ml    Vitals:  Vitals:   01/08/24 0855 01/08/24 0900 01/08/24 0930 01/08/24 1000  BP: 124/75 124/75 125/77 124/80  Pulse: 76 76 77 78  Resp: 19 17 20 15   Temp:      TempSrc:      SpO2: 95% 94% 98% 97%  Weight:      Height:         Physical Exam:    General adult male in bed in no acute distress, chronically ill appearing HEENT normocephalic atraumatic extraocular movements intact sclera anicteric Neck supple trachea midline Lungs clear to auscultation bilaterally normal work of breathing at rest on room air  Heart S1S2 no rub Abdomen soft nontender nondistended Extremities no edema  Neuro alert and oriented x 3 provides a history and follows commands  Access RIJ tunn catheter in place   Medications reviewed   Labs:     Latest Ref Rng & Units 01/08/2024    5:08 AM 01/07/2024    6:16 AM 01/06/2024    4:17 AM  BMP  Glucose 70 - 99 mg/dL 409  88  91   BUN 6 - 20 mg/dL 18  13  30    Creatinine 0.61 - 1.24 mg/dL 8.11  9.14  7.82   Sodium 135 - 145 mmol/L 135  135  130   Potassium 3.5 - 5.1 mmol/L 3.7  4.0  4.2   Chloride 98 - 111 mmol/L 98  98  98   CO2 22 - 32 mmol/L 26  24  22    Calcium 8.9 - 10.3 mg/dL 8.7  8.5  8.4    Dialysis orders: TTS 2nd shift @ [facility]  Time 4hrs, 160NRe EDW 77.5kg (left at 76.2kg last treatment) 500/1.5 3/2 bath Mircera 225 mcg q. 2 weeks last given on February  25 Hectorol IV three times per week (on hold)     Assessment/Plan:   # ESRD on HD  - HD per TTS schedule   # Hematuria  - chronic indwelling foley recently changed to a suprapubic catheter - per urology and primary team   # Anemia of acute blood loss - s/p PRBC's with improvement - PRBC's per primary team   - note ESA given 2/25 outpatient   # Afib  - Per primary team; on amio  # Metabolic bone disease  - hectorol is listed as on hold above; reassess outpatient    Disposition - per primary team   Estanislado Emms, MD 01/08/2024 10:28 AM

## 2024-01-08 NOTE — Discharge Planning (Signed)
 [facility] Patient Discharge Orders- [facility] CLINIC: [place]  Patient's name: Bruce Little Admit/DC Dates: 01/05/2024 - 01/08/24   Discharge Diagnoses: Hematuria likely 2/2 suprapubic catheter placement on 2/24 ABLA 2/2 #1  Sacral acute OM   Aranesp: Given: no   Last Hgb: 9.2 PRBC's Given: yes Date/# of units: 1 unit on 01/06/24 ESA dose for discharge: no change IV Iron dose at discharge: no change  Heparin change: none  EDW Change: no   Bath Change: none  Access intervention/Change: none Details:  Hectorol/Calcitriol change: none  Discharge Labs: Calcium 8.7 Phosphorus 2.6 Albumin 1.7 K+ 3.7  IV Antibiotics: no Details:  On Coumadin?: no Last INR: Next INR: Managed By:   OTHER/APPTS/LAB ORDERS: For sacral OM -  ID recommend not having prolonged treatment w/ABX d/t Hx C diff and low cure high relapse rates.  Stated "if desire to get full om treatment would advise outpatient plastic surgery/general surgery to arrange full I&D with deep bone culture, diverting colostomy, planned flap coverage around 2-3 weeks into abx treatment"    D/C Meds to be reconciled by nurse after every discharge.  Completed By: Virgina Norfolk, PA-C   Reviewed by: MD:______ RN_______

## 2024-01-08 NOTE — Plan of Care (Signed)
  Problem: Clinical Measurements: Goal: Ability to maintain clinical measurements within normal limits will improve Outcome: Adequate for Discharge Goal: Will remain free from infection Outcome: Adequate for Discharge Goal: Diagnostic test results will improve Outcome: Adequate for Discharge Goal: Respiratory complications will improve Outcome: Adequate for Discharge Goal: Cardiovascular complication will be avoided Outcome: Adequate for Discharge   Problem: Activity: Goal: Risk for activity intolerance will decrease Outcome: Adequate for Discharge   Problem: Nutrition: Goal: Adequate nutrition will be maintained Outcome: Adequate for Discharge   Problem: Coping: Goal: Level of anxiety will decrease Outcome: Adequate for Discharge   Problem: Elimination: Goal: Will not experience complications related to bowel motility Outcome: Adequate for Discharge Goal: Will not experience complications related to urinary retention Outcome: Adequate for Discharge   Problem: Pain Managment: Goal: General experience of comfort will improve and/or be controlled Outcome: Adequate for Discharge   Problem: Safety: Goal: Ability to remain free from injury will improve Outcome: Adequate for Discharge   Problem: Skin Integrity: Goal: Risk for impaired skin integrity will decrease Outcome: Adequate for Discharge   Problem: Education: Goal: Ability to describe self-care measures that may prevent or decrease complications (Diabetes Survival Skills Education) will improve Outcome: Adequate for Discharge Goal: Individualized Educational Video(s) Outcome: Adequate for Discharge   Problem: Coping: Goal: Ability to adjust to condition or change in health will improve Outcome: Adequate for Discharge   Problem: Fluid Volume: Goal: Ability to maintain a balanced intake and output will improve Outcome: Adequate for Discharge   Problem: Health Behavior/Discharge Planning: Goal: Ability to  identify and utilize available resources and services will improve Outcome: Adequate for Discharge Goal: Ability to manage health-related needs will improve Outcome: Adequate for Discharge   Problem: Metabolic: Goal: Ability to maintain appropriate glucose levels will improve Outcome: Adequate for Discharge   Problem: Nutritional: Goal: Maintenance of adequate nutrition will improve Outcome: Adequate for Discharge Goal: Progress toward achieving an optimal weight will improve Outcome: Adequate for Discharge   Problem: Skin Integrity: Goal: Risk for impaired skin integrity will decrease Outcome: Adequate for Discharge   Problem: Tissue Perfusion: Goal: Adequacy of tissue perfusion will improve Outcome: Adequate for Discharge   Problem: Education: Goal: Knowledge of disease and its progression will improve Outcome: Adequate for Discharge Goal: Individualized Educational Video(s) Outcome: Adequate for Discharge   Problem: Fluid Volume: Goal: Compliance with measures to maintain balanced fluid volume will improve Outcome: Adequate for Discharge   Problem: Health Behavior/Discharge Planning: Goal: Ability to manage health-related needs will improve Outcome: Adequate for Discharge   Problem: Nutritional: Goal: Ability to make healthy dietary choices will improve Outcome: Adequate for Discharge   Problem: Clinical Measurements: Goal: Complications related to the disease process, condition or treatment will be avoided or minimized Outcome: Adequate for Discharge

## 2024-01-08 NOTE — Discharge Summary (Addendum)
 [facility] Physician Discharge Summary   Patient name: Bruce Little  Admit date:     01/05/2024  Discharge date: 01/08/2024  Attending Physician: Eduard Clos [[id number]]  Discharge Physician: Carollee Herter   PCP: Street, Stephanie Coup, MD  Admitted From: Home  Disposition:  Home  Recommendations for Outpatient Follow-up:  Follow up with PCP in 1-2 weeks Follow up with routine dialysis on T, TH, Sat Follow up with wound care clinic as scheduled Follow up with vascular surgery regarding dialysis fistula/graft placement as scheduled  Home Health:No Equipment/Devices: None  Discharge Condition:Stable CODE STATUS:FULL Diet recommendation: Renal/Diabetic Fluid Restriction: None  Hospital Summary: HPI: Bruce Little is a 53 y.o. male with past medical history of intracranial bleed status post craniectomy in [place] in June 2024, ESRD on hemodialysis Tuesday Thursdays Saturday, paroxysmal atrial fibrillation no longer on anticoagulation takes amiodarone, hypertension, diabetes mellitus type 2, chronic anemia who has had a chronic indwelling Foley catheter which was changed to suprapubic catheter on 01/03/2024 and since then has been having persistent hematuria and presents to the ER.  Patient is only on baby aspirin not on any other anticoagulants.  Denies any fever chills.  Had followed up with wound care this morning.  Patient was admitted on 07/31/2024 for anemia and was transfused 1 unit of PRBC.   ED Course: In the ER patient's labs show hemoglobin of 7.8 which is dropped from 9.3 three days ago.  Urologist Dr. Laverle Patter was consulted.  Plan is to admit and observe and transfuse if there is any further drop in hemoglobin.  Significant Events: Admitted 01/05/2024 for hematuria   Significant Labs: WBC 15.3, Hgb 7.8, Plt 337 Na 132, K 4.5, CO2 of 26, BUN 26, scr 2.07  Significant Imaging Studies:   Antibiotic Therapy: Anti-infectives (From admission, onward)    None        Procedures:   Consultants: IR Urology Nephrology Infectious Disease   Hospital Course by Problem: * Gross hematuria 01-06-2024 due to recent suprapubic tube placement. Urology consulted. Defer to urology if he needs cystoscopy. 01-07-2024 Hematuria is slowing down. Not as much blood in urinary bag. Urology not planning on any intervention. Urology has signed off.   01-08-2024 HgB stable. Hold ASA for at least 2 weeks after discharge.   Acute blood loss anemia (ABLA) 01-06-2024 due to gross hematuria. Giving 1 unit PRBC today. Repeat HgB in AM. 01-07-2024 due to hematuria. Hematuria is slowing down. Not as much blood in urinary bag. Urology not planning on any intervention. Urology has signed off. HgB up to 9.3 g/dl after PRBC transfusion. Repeat HgB tomorrow after HD.  01-08-2023 HgB stable at 9.2 g/dl.  Sacral decubitus ulcer 01-06-2024 present on admission.  Wound care consulted. Pt has negative pressure wound vac. Wife states he goes to wound care clinic. 01-07-2024 has acute osteo of sacrum. ID consulted. Stage 4 wound.   01-08-2024 ID does not think pt need abx therapy. 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 F/u with wound care clinic.  Sacral osteomyelitis (HCC) 01-07-2024 had sacral bone biopsy performed on 01-05-2024 by wound care MD.  Pathology came back as acute osteomyelitis. Wife requesting ID consult.  01-08-2024 pt seen by ID. Dr. Renold Don did not recommend starting PO or IV abx. F/u with wound care clinic.  Hyponatremia - chronic. baseline 129-134 01-06-2024 stable. Baseline Na 129-134 01-07-2024 stable. Na 135.  01-08-2024 resolved.  ESRD (end stage renal disease) on dialysis (HCC) 01-06-2024 on HD on T, Th,  Sat. Nephrology consulted for HD. 01-07-2024 continue routine HD.  01-08-2024 continue routine outpatient HD. Pt to f/u with vascular surgery regarding AVF.  Leukocytosis 01-06-2024 monitor CBC. 01-07-2024 WBC stable at 15K. ID consulted for his  acute sacral osteo.  01-08-2024 stable. Not on abx per ID.  Paroxysmal A-fib (HCC) - Cannot be on systemic anticoagulants due to prior hemorrhagic CVA in June 2024. 01-06-2024 stable. 01-07-2024 pt is going to remain off ASA due to hematuria. Cannot be on systemic anticoagulants due to prior hemorrhagic CVA in June 2024.  01-08-2024 stable. Stay off ASA for at least 2 weeks.  HTN (hypertension) 01-06-2024 stable. On cozaar, clonidine, coreg, hytrin. 01-07-2024 HTN well controlled.   01-08-2024 stable.  DM2 (diabetes mellitus, type 2) (HCC) 01-06-2024 on lantus and SSI. 01-07-2024 CBG stable.  01-08-2024 stable. Pt was eating M&M during my interview with him.  Suprapubic catheter (HCC) - placed 01-03-2024. Suprapubic tube placed by IR on 01-03-2024.  History of hemorrhagic stroke with residual hemiparesis (HCC) 01-06-2024 chronic. Happened in June 2024 while pt was in [place].  S/P BKA (below knee amputation) unilateral, left (HCC) 01-06-2024 chronic.  Discharge Diagnoses:  Principal Problem:   Gross hematuria Active Problems:   Sacral decubitus ulcer   Acute blood loss anemia (ABLA)   Leukocytosis   ESRD (end stage renal disease) on dialysis (HCC)   Hyponatremia - chronic. baseline 129-134   Sacral osteomyelitis (HCC)   DM2 (diabetes mellitus, type 2) (HCC)   HTN (hypertension)   Paroxysmal A-fib (HCC) - Cannot be on systemic anticoagulants due to prior hemorrhagic CVA in June 2024.   S/P BKA (below knee amputation) unilateral, left (HCC)   History of hemorrhagic stroke with residual hemiparesis (HCC)   Suprapubic catheter (HCC) - placed 01-03-2024.  Discharge Instructions  Discharge Instructions     Call MD for:  difficulty breathing, headache or visual disturbances   Complete by: As directed    Call MD for:  extreme fatigue   Complete by: As directed    Call MD for:  hives   Complete by: As directed    Call MD for:  persistant dizziness or light-headedness    Complete by: As directed    Call MD for:  persistant nausea and vomiting   Complete by: As directed    Call MD for:  redness, tenderness, or signs of infection (pain, swelling, redness, odor or green/yellow discharge around incision site)   Complete by: As directed    Call MD for:  severe uncontrolled pain   Complete by: As directed    Call MD for:  temperature >100.4   Complete by: As directed    Diet renal/carb modified with fluid restriction   Complete by: As directed    Discharge instructions   Complete by: As directed    1. Follow up with primary care provider in 1-2 weeks as scheduled 2. Follow up with vascular surgery as scheduled 3. Follow up with routine hemodialysis as scheduled. 4. Follow up with wound care clinic as scheduled.   Discharge wound care:   Complete by: As directed    Wound care  Every shift      Comments: Dressing procedure/placement/frequency: 1.Cleanse left ischial wound with Vashe [place] # [phone]) pack wound with Vashe damp gauze (kerlix) top with ABD pads, secure with tape  2.Vashe moist gauze to the sacral wound Wound care  Once      Comments: Continue negative pressure dressing.   Increase activity slowly   Complete by: As  directed       Allergies as of 01/08/2024   No Known Allergies      Medication List     PAUSE taking these medications    aspirin EC 81 MG tablet Wait to take this until: January 28, 2024 Take 81 mg by mouth at bedtime. Swallow whole.       STOP taking these medications    doxycycline 100 MG capsule Commonly known as: VIBRAMYCIN       TAKE these medications    acetaminophen 500 MG tablet Commonly known as: TYLENOL Take 1,000 mg by mouth every 6 (six) hours as needed for mild pain (pain score 1-3) or headache.   amiodarone 100 MG tablet Commonly known as: PACERONE Take 100 mg by mouth daily.   amLODipine 10 MG tablet Commonly known as: NORVASC Take 10 mg by mouth daily.   ascorbic acid 500 MG  tablet Commonly known as: VITAMIN C Take 1 tablet (500 mg total) by mouth 2 (two) times daily.   ASHWAGANDHA PO Take 1 Dose by mouth at bedtime. 1 dose- 2 gummies   atorvastatin 40 MG tablet Commonly known as: LIPITOR Take 40 mg by mouth at bedtime.   CALCIUM 500 + D3 PO Take 1 tablet by mouth daily.   carvedilol 25 MG tablet Commonly known as: COREG Take 1 tablet (25 mg total) by mouth 2 (two) times daily with a meal.   cloNIDine 0.1 MG tablet Commonly known as: CATAPRES Take 1 tablet (0.1 mg total) by mouth daily.   diphenhydramine-acetaminophen 25-500 MG Tabs tablet Commonly known as: TYLENOL PM Take 2 tablets by mouth at bedtime as needed.   finasteride 5 MG tablet Commonly known as: PROSCAR Take 5 mg by mouth daily.   furosemide 40 MG tablet Commonly known as: LASIX Take 40 mg by mouth daily.   glipiZIDE 5 MG tablet Commonly known as: Glucotrol Take 1 tablet (5 mg total) by mouth daily.   hydrALAZINE 25 MG tablet Commonly known as: APRESOLINE Take 25 mg by mouth 3 (three) times daily.   Lantus SoloStar 100 UNIT/ML Solostar Pen Generic drug: insulin glargine Inject 5 Units into the skin daily. What changed: when to take this   levETIRAcetam 750 MG tablet Commonly known as: KEPPRA Take 1,500 mg by mouth 2 (two) times daily.   losartan 50 MG tablet Commonly known as: COZAAR Take 1 tablet (50 mg total) by mouth daily.   melatonin 5 MG Tabs Take 5-10 mg by mouth at bedtime as needed (sleep).   methocarbamol 500 MG tablet Commonly known as: ROBAXIN Take 1 tablet (500 mg total) by mouth every 8 (eight) hours as needed for muscle spasms. What changed:  when to take this additional instructions   multivitamin Tabs tablet Take 1 tablet by mouth at bedtime.   mupirocin ointment 2 % Commonly known as: BACTROBAN Apply 1 Application topically 2 (two) times daily.   NATURAL VITAMIN A PO Take 3,000 mcg by mouth daily.   ondansetron 4 MG disintegrating  tablet Commonly known as: ZOFRAN-ODT Take 4 mg by mouth every 8 (eight) hours as needed for nausea or vomiting.   pantoprazole 40 MG tablet Commonly known as: PROTONIX Take 1 tablet (40 mg total) by mouth daily.   PARoxetine 10 MG tablet Commonly known as: PAXIL Take 10 mg by mouth daily.   polyethylene glycol 17 g packet Commonly known as: MIRALAX / GLYCOLAX Take 17 g by mouth 2 (two) times daily. What changed:  when to take  this reasons to take this   promethazine 12.5 MG tablet Commonly known as: PHENERGAN Take 1 tablet (12.5 mg total) by mouth every 6 (six) hours as needed for nausea or vomiting. What changed: Another medication with the same name was added. Make sure you understand how and when to take each.   promethazine 25 MG suppository Commonly known as: PHENERGAN Place 1 suppository (25 mg total) rectally every 6 (six) hours as needed for nausea or vomiting. What changed: You were already taking a medication with the same name, and this prescription was added. Make sure you understand how and when to take each.   senna-docusate 8.6-50 MG tablet Commonly known as: Senokot-S Take 2 tablets by mouth 2 (two) times daily. What changed:  when to take this reasons to take this   terazosin 1 MG capsule Commonly known as: HYTRIN Take 1 mg by mouth at bedtime.   traZODone 100 MG tablet Commonly known as: DESYREL Take 1 tablet (100 mg total) by mouth at bedtime as needed for sleep.   Vitamin D (Ergocalciferol) 1.25 MG (50000 UNIT) Caps capsule Commonly known as: DRISDOL Take 1 capsule (50,000 Units total) by mouth every 7 (seven) days.               Discharge Care Instructions  (From admission, onward)           Start     Ordered   01/08/24 0000  Discharge wound care:       Comments: Wound care  Every shift      Comments: Dressing procedure/placement/frequency: 1.Cleanse left ischial wound with Vashe [place] # [phone]) pack wound with Vashe damp gauze  (kerlix) top with ABD pads, secure with tape  2.Vashe moist gauze to the sacral wound Wound care  Once      Comments: Continue negative pressure dressing.   01/08/24 1500            Follow-up Information     [facility] Follow up.   Specialty: Home Health Services Why: Someone will call you to schedule resumption of care order. Contact information: [address] [place] [address] [phone]                No Known Allergies  Discharge Exam: Vitals:   01/08/24 1238 01/08/24 1317  BP: 138/87 133/87  Pulse: 81   Resp: 16   Temp: 97.8 F (36.6 C)   SpO2: 100%    Physical Exam Vitals and nursing note reviewed.  Constitutional:      Appearance: He is not toxic-appearing.  Cardiovascular:     Rate and Rhythm: Normal rate and regular rhythm.  Abdominal:     General: Abdomen is flat. Bowel sounds are normal.  Genitourinary:    Comments: Suprapubic tubing without bright red blood. Has old clotted blood in bag and tubing. Musculoskeletal:     Comments: Left BKA  Skin:    General: Skin is warm and dry.     Capillary Refill: Capillary refill takes less than 2 seconds.  Neurological:     Mental Status: He is alert and oriented to person, place, and time.    The results of significant diagnostics from this hospitalization (including imaging, microbiology, ancillary and laboratory) are listed below for reference.    Microbiology: Recent Results (from the past 240 hours)  Aerobic Culture w Gram Stain (superficial specimen)     Status: None (Preliminary result)   Collection Time: 01/05/24 10:47 AM  Specimen: Wound  Result Value Ref Range Status   Specimen Description   Final    WOUND SACRUM Performed at [facility], [address] [place], [place] [address]    Special Requests   Final    NONE Performed at [facility], [address] [place], [place] [address]    Gram Stain    Final    RARE WBC PRESENT, PREDOMINANTLY PMN RARE GRAM POSITIVE COCCI    Culture   Final    CULTURE REINCUBATED FOR BETTER GROWTH RARE PSEUDOMONAS AERUGINOSA SUSCEPTIBILITIES TO FOLLOW Performed at [facility], [address], [place], [place] [address]    Report Status PENDING  Incomplete  MRSA Next Gen by PCR, Nasal     Status: Abnormal   Collection Time: 01/08/24  2:07 AM   Specimen: Nasal Mucosa; Nasal Swab  Result Value Ref Range Status   MRSA by PCR Next Gen DETECTED (A) NOT DETECTED Final    Comment: RESULT CALLED TO, READ BACK BY AND VERIFIED WITH: K GENGLER,RN@[id number]  01/08/24 MK (NOTE) The GeneXpert MRSA Assay (FDA approved for NASAL specimens only), is one component of a comprehensive MRSA colonization surveillance program. It is not intended to diagnose MRSA infection nor to guide or monitor treatment for MRSA infections. Test performance is not FDA approved in patients less than 26 years old. Performed at [facility], [address], [place], [place] [address]     Labs:  Basic Metabolic Panel: Recent Labs  Lab 01/05/24 1701 01/06/24 0417 01/07/24 0616 01/08/24 0508  NA 132* 130* 135 135  K 4.5 4.2 4.0 3.7  CL 97* 98 98 98  CO2 26 22 24 26   GLUCOSE 70 91 88 103*  BUN 26* 30* 13 18  CREATININE 2.07* 2.40* 1.66* 2.44*  CALCIUM 9.2 8.4* 8.5* 8.7*  PHOS  --   --   --  2.6   Liver Function Tests: Recent Labs  Lab 01/05/24 1701 01/07/24 0616 01/08/24 0508  AST 25 20  --   ALT 31 26  --   ALKPHOS 133* 135*  --   BILITOT 0.6 0.6  --   PROT 6.3* 6.5  --   ALBUMIN 1.6* 1.6* 1.7*   CBC: Recent Labs  Lab 01/05/24 1417 01/05/24 1701 01/06/24 0417 01/07/24 0616 01/08/24 0508  WBC 16.1* 15.3* 15.0* 14.6* 14.6*  NEUTROABS  --   --   --  11.9*  --   HGB 7.8* 7.8* 6.8* 9.3* 9.2*  HCT 26.0* 25.9* 23.1* 29.8* 30.0*  MCV 88.4 90.2 90.6 87.4 89.8  PLT 355 337 338 286 272   CBG: Recent Labs  Lab 01/07/24 1344 01/07/24 1707 01/07/24 2059  01/08/24 0721 01/08/24 1328  GLUCAP 101* 98 272* 111* 82   Sepsis Labs Recent Labs  Lab 01/05/24 1701 01/06/24 0417 01/07/24 0616 01/08/24 0508  WBC 15.3* 15.0* 14.6* 14.6*   Procedures/Studies: IR Radiologist Eval & Mgmt Result Date: 01/05/2024 EXAM: NEW PATIENT OFFICE VISIT CHIEF COMPLAINT: Electronic medical record HISTORY OF PRESENT ILLNESS: Electronic medical record REVIEW OF SYSTEMS: Electronic medical record PHYSICAL EXAMINATION: Electronic medical record ASSESSMENT AND PLAN: Electronic medical record Electronically Signed   By: Gilmer Mor D.O.   On: 01/05/2024 16:20   CT GUIDED SUPRAPUBIC CATHETER PLMT Result Date: 01/03/2024 INDICATION: Urinary retention with a Foley catheter. Request for suprapubic catheter placement. EXAM: CT-GUIDED SUPRAPUBIC CATHETER PLACEMENT MEDICATIONS: Rocephin 2 g; The antibiotic was administered in an appropriate time frame prior to skin  puncture. ANESTHESIA/SEDATION: Moderate (conscious) sedation was employed during this procedure. A total of Versed 2.0mg  and fentanyl 75 mcg was administered intravenously at the order of the provider performing the procedure. Total intra-service moderate sedation time: 22 minutes. Patient's level of consciousness and vital signs were monitored continuously by radiology nurse throughout the procedure under the supervision of the provider performing the procedure. CONTRAST:  None FLUOROSCOPY TIME:  None COMPLICATIONS: None immediate. PROCEDURE: The procedure was explained to the patient. The risks and benefits of the procedure were discussed and the patient's questions were addressed. Informed consent was obtained from the patient. Patient was placed supine on the CT scanner. Images were obtained of the pelvis. Fluid was instilled in the urinary bladder through the existing Foley catheter. Additional CT images of the pelvis were obtained. Anterior pelvis was prepped with chlorhexidine and sterile field was created. Maximal  barrier sterile technique was utilized including caps, mask, sterile gowns, sterile gloves, sterile drape, hand hygiene and skin antiseptic. Skin was anesthetized with 1% lidocaine. A small incision was made. Using CT guidance, an 18 gauge trocar needle was directed into the bladder. Urine was aspirated. Superstiff Amplatz wire was placed. The tract was dilated to accommodate a 16 [place] multipurpose drain. Additional urine was aspirated. Follow up CT images confirmed placement in the urinary bladder. Suprapubic catheter was sutured to skin and attached to a gravity bag. Dressing was placed. Foley catheter was removed at the end of the procedure. FINDINGS: Urinary bladder was adequately distended after 150 mL of saline was instilled through the Foley catheter. Suprapubic catheter confirmed within the urinary bladder at the end of the procedure. IMPRESSION: CT-guided placement of suprapubic catheter. Electronically Signed   By: Richarda Overlie M.D.   On: 01/03/2024 10:24   PERIPHERAL VASCULAR CATHETERIZATION Result Date: 12/28/2023 Patient presents with poor flows in his right IJ tunneled hemodialysis catheter (19 cm cuff to tip- Palindrome) that was placed 5 months ago. On examination, aspiration from both ports is good however flushing from the arterial port is sluggish. Chest x-ray confirms the catheter tip is a little higher positioned in the SVC. The catheter cuff is right at the exit site.  Summary: 1) The patient had a successful 23 cm CTT Palindrome hemodialysis catheter exchange in the right internal jugular vein. 2) No sheath noted through either port. 3) Okay to use catheter immediately. Description of procedure: The right neck, chest and the catheter were prepped and draped in the usual sterile fashion. The exit site and adjacent tunnel tract were anesthetized with lidocaine 1% with epinephrine. The cuff was dissected free with a curved Kelly and manual traction. The catheter was withdrawn and venogram  through each of the ports was performed; there was no fibrin sheath evident through either port. A hydrophilic wire was manipulated and advanced through the arterial port and the tip was parked in the IVC. The catheter was completely removed and noted to be entirely intact. A new 23 cm cuff to tip Palindrome catheter was inserted over the guidewire and the tip parked in the junction of the IVC/right atrium and at that point the cuff was approximately 1 cm deep to the exit site. Aspiration and flushing of both limbs of the catheter confirmed excellent flow. No kinks were visible on fluoroscopic imaging. Both limbs of the catheter were locked with heparin and sterile caps were placed. The hub was secured on to the chest wall with 2-0 nylon wing sutures. Sterile dressings were placed, and the patient returned to  recovery in stable condition. Sedation: none Sedation time: n/a Contrast: 3 mL Monitoring: Because of the patient's comorbid conditions and sedation during the procedure, continuous EKG monitoring and O2 saturation monitoring was performed throughout the procedure by the RN. There were no abnormal arrhythmias encountered. Complications: None. Diagnoses:  T82.49XA Other complication of vascular dialysis catheter (Poor flows) N18.6 End stage renal disease Z99.2 Dialysis dependence Procedures Coding: 64403 Tunneled catheter exchange 77001  Fluoroscopy guidance for catheter exchange. K7425 Contrast Recommendations: Remove the suture in 3 weeks. 2.   Report any blood flow problems to [facility]. Discharge: The patient was discharged home in stable condition. The patient was given education regarding the care of the catheter and specific instructions in case of any problems.    Time coordinating discharge: 50 mins  SIGNED:  Carollee Herter, DO [facility] 01/08/24, 3:13 PM

## 2024-01-08 NOTE — TOC Transition Note (Signed)
 Transition of Care [facility]) - Discharge Note   Patient Details  Name: Bruce Little MRN: [id number] Date of Birth: Nov 03, 1971  Transition of Care [facility]) CM/SW Contact:  Ronny Bacon, RN Phone Number: 01/08/2024, 3:31 PM   Clinical Narrative:  Patient is being discharged today. Glenda with [facility] made aware.      Final next level of care: Home w Home Health Services Barriers to Discharge: No Barriers Identified   Patient Goals and CMS Choice            Discharge Placement                       Discharge Plan and Services Additional resources added to the After Visit Summary for                                       Social Drivers of Health (SDOH) Interventions SDOH Screenings   Food Insecurity: No Food Insecurity (01/05/2024)  Housing: High Risk (01/05/2024)  Transportation Needs: No Transportation Needs (01/05/2024)  Utilities: Not At Risk (01/05/2024)  Depression (PHQ2-9): Low Risk  (08/27/2021)  Financial Resource Strain: Low Risk  (08/10/2023)   Received from [facility]  Social Connections: Unknown (08/13/2023)   Received from [facility]  Stress: No Stress Concern Present (08/10/2023)   Received from [facility]  Tobacco Use: Low Risk  (01/05/2024)     Readmission Risk Interventions    09/22/2023   11:18 AM 07/11/2021    3:04 PM  Readmission Risk Prevention Plan  Transportation Screening Complete Complete  PCP or Specialist Appt within 5-7 Days Complete Complete  Home Care Screening Complete Complete  Medication Review (RN CM) Referral to Pharmacy Complete

## 2024-01-08 NOTE — Progress Notes (Signed)
 PROGRESS NOTE    Bruce Little  UJW:[id number] DOB: Feb 22, 1971 DOA: 01/05/2024 PCP: Bobbye Morton, MD  Subjective: Pt seen and examined. Wife [name] at bedside. Hematuria has resolved. Only minimal pink tinged blood in suprapubic tube tubing.  Wife wants to take patient home today. She has her own transport van. Pt has his wheelchair here.   Hospital Course: HPI: Bruce Little is a 53 y.o. male with past medical history of intracranial bleed status post craniectomy in [place] in June 2024, ESRD on hemodialysis Tuesday Thursdays Saturday, paroxysmal atrial fibrillation no longer on anticoagulation takes amiodarone, hypertension, diabetes mellitus type 2, chronic anemia who has had a chronic indwelling Foley catheter which was changed to suprapubic catheter on 01/03/2024 and since then has been having persistent hematuria and presents to the ER.  Patient is only on baby aspirin not on any other anticoagulants.  Denies any fever chills.  Had followed up with wound care this morning.  Patient was admitted on 07/31/2024 for anemia and was transfused 1 unit of PRBC.   ED Course: In the ER patient's labs show hemoglobin of 7.8 which is dropped from 9.3 three days ago.  Urologist Dr. Laverle Patter was consulted.  Plan is to admit and observe and transfuse if there is any further drop in hemoglobin.  Significant Events: Admitted 01/05/2024 for hematuria   Significant Labs: WBC 15.3, Hgb 7.8, Plt 337 Na 132, K 4.5, CO2 of 26, BUN 26, scr 2.07  Significant Imaging Studies:   Antibiotic Therapy: Anti-infectives (From admission, onward)    None       Procedures:   Consultants: IR Urology nephrology    Assessment and Plan: * Gross hematuria 01-06-2024 due to recent suprapubic tube placement. Urology consulted. Defer to urology if he needs cystoscopy. 01-07-2024 Hematuria is slowing down. Not as much blood in urinary bag. Urology not planning on any intervention. Urology has signed  off.   01-08-2024 HgB stable. Hold ASA for at least 2 weeks after discharge.   Acute blood loss anemia (ABLA) 01-06-2024 due to gross hematuria. Giving 1 unit PRBC today. Repeat HgB in AM. 01-07-2024 due to hematuria. Hematuria is slowing down. Not as much blood in urinary bag. Urology not planning on any intervention. Urology has signed off. HgB up to 9.3 g/dl after PRBC transfusion. Repeat HgB tomorrow after HD.  01-08-2023 HgB stable at 9.2 g/dl.  Sacral decubitus ulcer 01-06-2024 present on admission.  Wound care consulted. Pt has negative pressure wound vac. Wife states he goes to wound care clinic. 01-07-2024 has acute osteo of sacrum. ID consulted. Stage 4 wound.   01-08-2024 ID does not think pt need abx therapy. 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 F/u with wound care clinic.  Sacral osteomyelitis (HCC) 01-07-2024 had sacral bone biopsy performed on 01-05-2024 by wound care MD.  Pathology came back as acute osteomyelitis. Wife requesting ID consult.  01-08-2024 pt seen by ID. Dr. Renold Don did not recommend starting PO or IV abx. F/u with wound care clinic.  Hyponatremia - chronic. baseline 129-134 01-06-2024 stable. Baseline Na 129-134 01-07-2024 stable. Na 135.  01-08-2024 resolved.  ESRD (end stage renal disease) on dialysis (HCC) 01-06-2024 on HD on T, Th, Sat. Nephrology consulted for HD. 01-07-2024 continue routine HD.  01-08-2024 continue routine outpatient HD. Pt to f/u with vascular surgery regarding AVF.  Leukocytosis 01-06-2024 monitor CBC. 01-07-2024 WBC stable at 15K. ID consulted for his acute sacral osteo.  01-08-2024 stable. Not on abx per ID.  Paroxysmal A-fib (HCC) - Cannot be on systemic anticoagulants due to prior hemorrhagic CVA in June 2024. 01-06-2024 stable. 01-07-2024 pt is going to remain off ASA due to hematuria. Cannot be on systemic anticoagulants due to prior hemorrhagic CVA in June 2024.  01-08-2024 stable. Stay off ASA for at least 2  weeks.  HTN (hypertension) 01-06-2024 stable. On cozaar, clonidine, coreg, hytrin. 01-07-2024 HTN well controlled.   01-08-2024 stable.  DM2 (diabetes mellitus, type 2) (HCC) 01-06-2024 on lantus and SSI. 01-07-2024 CBG stable.  01-08-2024 stable. Pt was eating [employer] during my interview with him.  History of hemorrhagic stroke with residual hemiparesis (HCC) 01-06-2024 chronic. Happened in June 2024 while pt was in [place].  S/P BKA (below knee amputation) unilateral, left (HCC) 01-06-2024 chronic.  DVT prophylaxis: SCDs Start: 01/05/24 2131    Code Status: Full Code Family Communication: discussed with pt's wife ashley at bedside Disposition Plan: return home Reason for continuing need for hospitalization: stable for DC home today. Wife requesting home today.  Objective: Vitals:   01/08/24 1200 01/08/24 1210 01/08/24 1238 01/08/24 1317  BP: 117/80 126/88 138/87 133/87  Pulse: 81  81   Resp: 15  16   Temp:  (!) 97 F (36.1 C) 97.8 F (36.6 C)   TempSrc:  Oral    SpO2: 97%  100%   Weight:    79 kg  Height:        Intake/Output Summary (Last 24 hours) at 01/08/2024 1507 Last data filed at 01/08/2024 1238 Gross per 24 hour  Intake 270 ml  Output 451 ml  Net -181 ml   Filed Weights   01/05/24 1644 01/08/24 0848 01/08/24 1317  Weight: 78 kg 80 kg 79 kg    Examination:  Physical Exam Vitals and nursing note reviewed.  Constitutional:      Appearance: He is not toxic-appearing.  Cardiovascular:     Rate and Rhythm: Normal rate and regular rhythm.  Abdominal:     General: Abdomen is flat. Bowel sounds are normal.  Genitourinary:    Comments: Suprapubic tubing without bright red blood. Has old clotted blood in bag and tubing. Musculoskeletal:     Comments: Left BKA  Skin:    Capillary Refill: Capillary refill takes less than 2 seconds.  Neurological:     Mental Status: He is alert and oriented to person, place, and time.     Data Reviewed: I have  personally reviewed following labs and imaging studies  CBC: Recent Labs  Lab 01/05/24 1417 01/05/24 1701 01/06/24 0417 01/07/24 0616 01/08/24 0508  WBC 16.1* 15.3* 15.0* 14.6* 14.6*  NEUTROABS  --   --   --  11.9*  --   HGB 7.8* 7.8* 6.8* 9.3* 9.2*  HCT 26.0* 25.9* 23.1* 29.8* 30.0*  MCV 88.4 90.2 90.6 87.4 89.8  PLT 355 337 338 286 272   Basic Metabolic Panel: Recent Labs  Lab 01/05/24 1701 01/06/24 0417 01/07/24 0616 01/08/24 0508  NA 132* 130* 135 135  K 4.5 4.2 4.0 3.7  CL 97* 98 98 98  CO2 26 22 24 26   GLUCOSE 70 91 88 103*  BUN 26* 30* 13 18  CREATININE 2.07* 2.40* 1.66* 2.44*  CALCIUM 9.2 8.4* 8.5* 8.7*  PHOS  --   --   --  2.6   GFR: Estimated Creatinine Clearance: 40 mL/min (A) (by C-G formula based on SCr of 2.44 mg/dL (H)). Liver Function Tests: Recent Labs  Lab 01/05/24 1701 01/07/24 0616 01/08/24 0508  AST  25 20  --   ALT 31 26  --   ALKPHOS 133* 135*  --   BILITOT 0.6 0.6  --   PROT 6.3* 6.5  --   ALBUMIN 1.6* 1.6* 1.7*   CBG: Recent Labs  Lab 01/07/24 1344 01/07/24 1707 01/07/24 2059 01/08/24 0721 01/08/24 1328  GLUCAP 101* 98 272* 111* 82   Recent Results (from the past 240 hours)  Aerobic Culture w Gram Stain (superficial specimen)     Status: None (Preliminary result)   Collection Time: 01/05/24 10:47 AM   Specimen: Wound  Result Value Ref Range Status   Specimen Description   Final    WOUND SACRUM Performed at [facility], [address], [place], [place] [address]    Special Requests   Final    NONE Performed at [facility], [address], [place], [place] [address]    Gram Stain   Final    RARE WBC PRESENT, PREDOMINANTLY PMN RARE GRAM POSITIVE COCCI    Culture   Final    CULTURE REINCUBATED FOR BETTER GROWTH RARE PSEUDOMONAS AERUGINOSA SUSCEPTIBILITIES TO FOLLOW Performed at [facility], [address], [place], [place] [address]    Report Status PENDING  Incomplete   MRSA Next Gen by PCR, Nasal     Status: Abnormal   Collection Time: 01/08/24  2:07 AM   Specimen: Nasal Mucosa; Nasal Swab  Result Value Ref Range Status   MRSA by PCR Next Gen DETECTED (A) NOT DETECTED Final    Comment: RESULT CALLED TO, READ BACK BY AND VERIFIED WITH: K GENGLER,RN@[id number]  01/08/24 MK (NOTE) The GeneXpert MRSA Assay (FDA approved for NASAL specimens only), is one component of a comprehensive MRSA colonization surveillance program. It is not intended to diagnose MRSA infection nor to guide or monitor treatment for MRSA infections. Test performance is not FDA approved in patients less than 37 years old. Performed at [facility], [address], [place], [place] [address]     Scheduled Meds:  sodium chloride   Intravenous Once   amiodarone  100 mg Oral Daily   amLODipine  10 mg Oral Daily   ascorbic acid  500 mg Oral BID   atorvastatin  40 mg Oral QHS   carvedilol  25 mg Oral BID WC   Chlorhexidine Gluconate Cloth  6 each Topical Q0600   Chlorhexidine Gluconate Cloth  6 each Topical Q0600   Chlorhexidine Gluconate Cloth  6 each Topical Daily   cloNIDine  0.1 mg Oral Daily   finasteride  5 mg Oral Daily   furosemide  40 mg Oral Daily   heparin sodium (porcine)       hydrALAZINE  25 mg Oral TID   insulin aspart  0-6 Units Subcutaneous TID WC   insulin glargine  5 Units Subcutaneous Daily   levETIRAcetam  1,500 mg Oral BID   losartan  50 mg Oral Daily   methocarbamol  500 mg Oral BID   multivitamin  1 tablet Oral QHS   mupirocin ointment  1 Application Nasal BID   pantoprazole  40 mg Oral Daily   PARoxetine  10 mg Oral Daily   terazosin  1 mg Oral QHS   Continuous Infusions:   LOS: 0 days   Time spent: 40 minutes  Carollee Herter, DO  [facility]  01/08/2024, 3:07 PM

## 2024-01-08 NOTE — Assessment & Plan Note (Signed)
 Suprapubic tube placed by IR on 01-03-2024.

## 2024-01-08 NOTE — Progress Notes (Signed)
 DISCHARGE NOTE HOME [facility] to be discharged Home per MD order. Discussed prescriptions and follow up appointments with the patient. Prescriptions given to patient; medication list explained in detail. Patient verbalized understanding.  Skin clean, dry and intact without evidence of skin break down, no evidence of skin tears noted. IV catheter discontinued intact. Site without signs and symptoms of complications. Dressing and pressure applied. Pt denies pain at the site currently. No complaints noted.  Patient free of lines, drains, and wounds.   An After Visit Summary (AVS) was printed and given to the patient. Patient escorted via wheelchair, and discharged home via private auto.  Margarita Grizzle, RN

## 2024-01-08 NOTE — Progress Notes (Signed)
 Received patient in bed.Awake ,alert and oriented x 3. Consent verified.  Access used : Right HD catheter .Dressing changed done today.  UF goal : Met 1 liter.  Duration of treatment : Yes  Hand off to the patient's nurse :Back into his room with stable medical condition via transporter.

## 2024-01-10 LAB — AEROBIC CULTURE W GRAM STAIN (SUPERFICIAL SPECIMEN)

## 2024-01-10 NOTE — Progress Notes (Signed)
 Late Note Entry- January 10, 2024  Pt was d/c on Saturday. Contacted [facility] this morning to be advised of pt's d/c date and that pt should resume care tomorrow.   Olivia Canter Renal Navigator [phone]

## 2024-01-11 ENCOUNTER — Other Ambulatory Visit (HOSPITAL_COMMUNITY): Payer: Self-pay | Admitting: Neurosurgery

## 2024-01-11 DIAGNOSIS — M952 Other acquired deformity of head: Secondary | ICD-10-CM

## 2024-01-14 ENCOUNTER — Encounter (HOSPITAL_BASED_OUTPATIENT_CLINIC_OR_DEPARTMENT_OTHER): Payer: 59 | Attending: Internal Medicine | Admitting: Internal Medicine

## 2024-01-14 DIAGNOSIS — L89324 Pressure ulcer of left buttock, stage 4: Secondary | ICD-10-CM | POA: Insufficient documentation

## 2024-01-14 DIAGNOSIS — E11622 Type 2 diabetes mellitus with other skin ulcer: Secondary | ICD-10-CM | POA: Insufficient documentation

## 2024-01-14 DIAGNOSIS — Z794 Long term (current) use of insulin: Secondary | ICD-10-CM | POA: Diagnosis not present

## 2024-01-14 DIAGNOSIS — N186 End stage renal disease: Secondary | ICD-10-CM | POA: Diagnosis not present

## 2024-01-14 DIAGNOSIS — L89154 Pressure ulcer of sacral region, stage 4: Secondary | ICD-10-CM | POA: Insufficient documentation

## 2024-01-14 DIAGNOSIS — S80811A Abrasion, right lower leg, initial encounter: Secondary | ICD-10-CM

## 2024-01-14 DIAGNOSIS — Z89512 Acquired absence of left leg below knee: Secondary | ICD-10-CM | POA: Insufficient documentation

## 2024-01-14 DIAGNOSIS — L89610 Pressure ulcer of right heel, unstageable: Secondary | ICD-10-CM | POA: Diagnosis not present

## 2024-01-14 DIAGNOSIS — E1122 Type 2 diabetes mellitus with diabetic chronic kidney disease: Secondary | ICD-10-CM | POA: Diagnosis not present

## 2024-01-17 ENCOUNTER — Encounter (HOSPITAL_COMMUNITY): Payer: Self-pay | Admitting: Vascular Surgery

## 2024-01-17 ENCOUNTER — Other Ambulatory Visit: Payer: Self-pay

## 2024-01-17 DIAGNOSIS — N186 End stage renal disease: Secondary | ICD-10-CM

## 2024-01-17 NOTE — Anesthesia Preprocedure Evaluation (Signed)
 Anesthesia Evaluation  Patient identified by MRN, date of birth, ID band Patient awake    Reviewed: Allergy & Precautions, NPO status , Patient's Chart, lab work & pertinent test results  Airway Mallampati: II  TM Distance: >3 FB Neck ROM: Full    Dental  (+) Dental Advisory Given   Pulmonary neg pulmonary ROS   breath sounds clear to auscultation       Cardiovascular hypertension, Pt. on medications  Rhythm:Regular Rate:Normal     Neuro/Psych  Neuromuscular disease CVA    GI/Hepatic Neg liver ROS,GERD  ,,  Endo/Other  diabetes, Type 2    Renal/GU ESRF and DialysisRenal disease     Musculoskeletal   Abdominal   Peds  Hematology  (+) Blood dyscrasia, anemia   Anesthesia Other Findings   Reproductive/Obstetrics                             Anesthesia Physical Anesthesia Plan  ASA: 4  Anesthesia Plan: MAC   Post-op Pain Management:    Induction:   PONV Risk Score and Plan: 1 and Propofol infusion, Ondansetron and Treatment may vary due to age or medical condition  Airway Management Planned: Natural Airway and Simple Face Mask  Additional Equipment: None  Intra-op Plan:   Post-operative Plan:   Informed Consent: I have reviewed the patients History and Physical, chart, labs and discussed the procedure including the risks, benefits and alternatives for the proposed anesthesia with the patient or authorized representative who has indicated his/her understanding and acceptance.       Plan Discussed with: CRNA  Anesthesia Plan Comments: (See PAT note written 01/17/2024 by Shonna Chock, PA-C. He does not have a brain flap yet, so uses helmet.   )       Anesthesia Quick Evaluation

## 2024-01-17 NOTE — Progress Notes (Signed)
 Anesthesia Chart Review: Bruce Little  Case: [id number] Date/Time: 01/18/24 0940   Procedure: LEFT ARM ARTERIOVENOUS (AV) FISTULA CREATION (Left)   Anesthesia type: Choice   Pre-op diagnosis: ESRD   [facility]: [facility] ROOM 12 / [facility]   Surgeons: Maeola Harman, MD       DISCUSSION: Patient is a 53 year old male scheduled for the above procedure. Surgery was initially planned for 12/24/23, but he had a draining right leg cyst. He was also receiving wound care with [facility] for a chronic sacral decubitus. Surgery postponed and rescheduled for 01/18/24. He continues to follow with the [facility] and received wound care to multiple sites. As of late February, ID did not advised long term antibiotics. Of note, he had profound hypoglycemia (glucose of 15) on 12/28/23 prior to right internal jugular Palindrome TDC catheter exchange, but in the setting of taking Lantus and glipizide while NPO for procedure. He responded appropriately to D50.     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   Prolonged admission to [facility] from 04/30/2023 - 07/01/2023 for right frontoparietal ICH, s/p right decompressive craniectomy and evacuation of hematoma on 04/30/2023.  He underwent right IJ PermCath on 05/03/2023 for dialysis due to progression from CKD5 to ESRD.  He underwent tracheostomy and gastrostomy tube insertion 05/14/2023.  He required left external ventricular drain via twist drill  bur hole on 05/15/2023 for communicating hydrocephalus.  This was discontinued on 06/02/2023 due to positive CSF cultures for Staph epidermidis. EEG with epileptiform spikes resolved on Keppra. He developed a sacral decubitus and underwent debridement on 06/04/2023 and 06/07/2023.  He developed right cranial wound infection with dehiscence and underwent I&D on 06/07/2023. By notes, the bone flap was not replaced. Hospital course also complicated by C. difficile diarrhea, s/p vancomycin. He was discharged to [facility] and was there from 07/01/2023 - 08/10/2023. Janina Mayo was decannulated 07/23/2023. He is no longer on Eliquis for PAF history.    [place] admission 09/17/2023 - 10/03/2023 for sepsis secondary to Pseudomonas UTI as well as sacral decubitus ulcer with wound VAC dysfunction. Antibiotics per ID. Wound Care and general surgery wound debridement and management of wound VAC. Diarrhea improved with weaning tube feeds. Nephrology managed HD. Reported G-tube now only being flushed for patency and is not being used for nutrition.    [place] admission 12/31/23 - 01/01/24 for anemia with HBG 7.1 (previously 8.9 11/03/23). S/p 1 unit PRBC during hemodialysis. H/H 7.9/25.6 at discharge. Readmitted 01/05/24 - 01/08/24 hematuria in setting of suprapubic catheter placement by IR for urinary retention on 01/03/24. Urology consulted. Bleeding likely self limited and ASA temporarily held. S/p 1 unit PRBC 01/06/24 with follow-up HGB 9.3. ID and wound care RN consulted for chronic sacral decubitus ulcers and sacral osteomyelitis by bone biopsy on 01/05/24 (per [facility] and had been placed on doxycycline). There was no concerns for sepsis, so ID did not feel prolonged antibiotic course was warranted. Continue follow-up with [facility] and consider out-patient plastic surgery/general surgery for deep bone culture and evaluation for diverting colostomy or flap coverage.  Last visit noted with [facility]  was on on 01/14/24.  Follow-up wound culture grew Pseudomonas aeruginosa. Dr. Mikey Bussing planned to refer back to ID for additional input. He was using wound VAC to sacral wound and Vashe wet-to-dry to the left ischium ulcer. She noted two new wounds to his right calcaneous and an abrasion to his right knee with wound care outlined. She would try to order an air fluidized (Group 3) mattress. Currently using low air-loss (Group 2) mattress. He also uses a Manufacturing engineer to RLE regularly. She also ordered formal ABI/TBIs of RLE.    Anesthesia tem to evaluate on the day of surgery. He does not have a skull flap yet, so uses a helmet with transferring. He is bedridden since his stroke and has chronic sacral ulcers as mentioned. His G-tube is not being used currently.    VS:  Wt Readings from Last 3 Encounters:  01/08/24 79 kg  01/01/24 78 kg  12/28/23 80.3 kg   BP Readings from Last 3 Encounters:  01/08/24 133/87  01/05/24 119/71  01/03/24 118/79   Pulse Readings from Last 3 Encounters:  01/08/24 81  01/05/24 74  01/03/24 74    PROVIDERS: Street, Stephanie Coup, MD is PCP  - He had previously evaluation by cardiologist Truett Mainland, MD on 07/31/2021 for PAF and hospital follow-up. Lisbeth Renshaw, MD is neurosurgeon (for local follow right craniectomy) - Geralyn Corwin, MD is wound care provider   LABS: For day of surgery. Last results in [facility] include: Lab Results  Component Value Date   WBC 14.6 (H) 01/08/2024   HGB 9.2 (L) 01/08/2024   HCT 30.0 (L) 01/08/2024   PLT 272 01/08/2024   GLUCOSE 103 (H) 01/08/2024   ALT 26 01/07/2024   AST 20 01/07/2024   NA 135 01/08/2024   K 3.7 01/08/2024   CL 98 01/08/2024   CREATININE 2.44 (H) 01/08/2024   BUN 18 01/08/2024   CO2 26 01/08/2024   TSH 3.323 11/03/2023   INR 1.1 09/18/2023   HGBA1C 5.1 09/18/2023    OTHER: EEG 09/27/2023: IMPRESSION: This study is suggestive of cortical dysfunction arising from right  centro-parietal region  consistent with underlying craniotomy. Additionally there is cortical dysfunction in right hemisphere likely secondary to underlying stroke. No seizures were seen throughout the recording.     IMAGES: CT Abd/pelvis 11/01/2023: IMPRESSION: - Sacral decubitus ulcer is significantly smaller compared to prior exam, with stable destructive changes involving sacrum and coccyx consistent with chronic osteomyelitis. - Gastrostomy tube is in grossly good position. - Large amount of stool seen in rectum concerning for impaction. - Minimal left lower lobe airspace opacity is noted suggesting possible inflammation or atelectasis. - Aortic Atherosclerosis (ICD10-I70.0).   CT Head 11/01/2023: IMPRESSION: No acute finding.  No hemorrhage or new infarct.     EKG: 11/02/2023: Sinus rhythm Borderline T wave abnormalities Borderline prolonged QT interval No significant change was found Confirmed by Glynn Octave [phone]) on 11/01/2023 7:23:27 AM     CV: Echo 07/04/2021: IMPRESSIONS   1. Left ventricular ejection fraction, by estimation, is 55 to 60%. The  left ventricle has normal function. The left ventricle has no regional  wall motion abnormalities. There is mild left ventricular hypertrophy.  Left ventricular diastolic parameters  were normal.   2. Right ventricular systolic function is normal. The right ventricular  size is normal. Tricuspid regurgitation signal is inadequate for assessing  PA pressure.   3. The mitral valve is normal  in structure. Mild mitral valve  regurgitation. No evidence of mitral stenosis.   4. The aortic valve is grossly normal. Aortic valve regurgitation is  trivial. No aortic stenosis is present.   5. The inferior vena cava is normal in size with greater than 50%  respiratory variability, suggesting right atrial pressure of 3 mmHg.    US Carotid 05/20/2021: Summary:  - Right Carotid: Velocities in the right ICA are consistent with a  40-59% stenosis.  - Left Carotid: Velocities in the left ICA are consistent with a 1-39% stenosis.  - Vertebrals: Bilateral vertebral arteries demonstrate antegrade flow.     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 05/03/2023   CKD (chronic kidney disease) stage 5, GFR less than 15 ml/min (HCC) 07/02/2021   COVID    has had it 2 times, one mild and one wasn't   Depression    DM2 (diabetes mellitus, type 2) (HCC)    Family history of adverse reaction to anesthesia    Dad has a "hard time waking up" after anesthesia   GERD (gastroesophageal reflux disease)    Hemorrhagic stroke (HCC) 04/30/2023   s/p right decompressive craniectomy and evacuation of hematoma on 04/30/2023   History of blood transfusion    HTN (hypertension)    ICH (intracerebral hemorrhage) (HCC) 09/17/2023   Osteomyelitis of fifth toe of left foot (HCC) 08/13/2021   Osteomyelitis of fourth toe of left foot (HCC) 08/13/2021   Pneumonia    Sacral decubitus ulcer 05/2023   Stroke (HCC) 05/19/2021   unable to move left side    Past Surgical History:  Procedure Laterality Date   AMPUTATION Left 08/29/2021   Procedure: AMPUTATION OF FOURTH TOE AND RAY ALONG WITH REMAINING FITH METATARSAL;  Surgeon: Tarry Kos, MD;  Location: [facility];  Service: Orthopedics;  Laterality: Left;   AMPUTATION Left 09/03/2021   Procedure: LISFRANC AMPUTATION;  Surgeon: Tarry Kos, MD;  Location[facility];  Service: Orthopedics;  Laterality: Left;   AMPUTATION Left 10/29/2021   Procedure: AMPUTATION BELOW KNEE -LEFT;  Surgeon: Tarry Kos, MD;  [facility];  Service: Orthopedics;  Laterality: Left;   APPLICATION OF WOUND VAC Left 07/07/2021   Procedure: APPLICATION OF WOUND VAC;  Surgeon: Tarry Kos, MD;  [facility]: [facility];   Service: Orthopedics;  Laterality: Left;   APPLICATION OF WOUND VAC Left 08/29/2021   Procedure: APPLICATION OF WOUND VAC;  Surgeon: Tarry Kos, MD;  Location[facility];  Service: Orthopedics;  Laterality: Left;   DIALYSIS/PERMA CATHETER INSERTION N/A 12/28/2023   Procedure: DIALYSIS/PERMA CATHETER INSERTION;  Surgeon: Dagoberto Ligas, MD;  Location: [facility] INVASIVE CV LAB;  Service: Cardiovascular;  Laterality: N/A;   DIALYSIS/PERMA CATHETER REMOVAL N/A 12/28/2023   Procedure: DIALYSIS/PERMA CATHETER REMOVAL;  Surgeon: Dagoberto Ligas, MD;  Location: [facility] INVASIVE CV LAB;  Service: Cardiovascular;  Laterality: N/A;   I & D EXTREMITY Left 07/03/2021   Procedure: IRRIGATION AND DEBRIDEMENT ,FIFTH RAY  AMPUTATION LEFT FOOT, , WOUND VAC PLACEMENT;  Surgeon: Tarry Kos, MD;  [facility];  Service: Orthopedics;  Laterality: Left;   I & D EXTREMITY Left 07/07/2021   Procedure: IRRIGATION AND DEBRIDEMENT LEFT FOOT;  Surgeon: Tarry Kos, MD;  [facility];  Service: Orthopedics;  Laterality: Left;   I & D EXTREMITY Left 08/29/2021   Procedure: IRRIGATION AND DEBRIDEMENT LEFT FOOT;  Surgeon: Tarry Kos, MD;  [facility];  Service: Orthopedics;  Laterality: Left;   IR FLUORO GUIDE CV LINE RIGHT  07/09/2021   IR RADIOLOGIST EVAL & MGMT  01/05/2024   IR REMOVAL TUN CV CATH W/O FL  10/08/2021   IR REMOVAL TUN CV CATH W/O FL  07/13/2023   IR REPLACE G-TUBE SIMPLE WO FLUORO  09/20/2023   IR US GUIDE VASC ACCESS RIGHT  07/09/2021   TRACHEOSTOMY     05/14/2023 - 07/23/2023   VITRECTOMY Left    [facility]    MEDICATIONS: No current facility-administered medications for this encounter.    acetaminophen (TYLENOL) 500 MG tablet   amiodarone (PACERONE) 100 MG tablet   amLODipine (NORVASC) 10 MG tablet   ascorbic acid (VITAMIN C) 500 MG tablet   ASHWAGANDHA PO   atorvastatin (LIPITOR) 40 MG tablet   Calcium Carb-Cholecalciferol (CALCIUM 500 + D3 PO)   carvedilol (COREG) 25 MG tablet   cloNIDine  (CATAPRES) 0.1 MG tablet   diphenhydramine-acetaminophen (TYLENOL PM) 25-500 MG TABS tablet   finasteride (PROSCAR) 5 MG tablet   furosemide (LASIX) 40 MG tablet   glipiZIDE (GLUCOTROL) 5 MG tablet   hydrALAZINE (APRESOLINE) 25 MG tablet   LANTUS SOLOSTAR 100 UNIT/ML Solostar Pen   levETIRAcetam (KEPPRA) 750 MG tablet   losartan (COZAAR) 50 MG tablet   melatonin 5 MG TABS   methocarbamol (ROBAXIN) 500 MG tablet   multivitamin (RENA-VIT) TABS tablet   mupirocin ointment (BACTROBAN) 2 %   NATURAL VITAMIN A PO   ondansetron (ZOFRAN-ODT) 4 MG disintegrating tablet   pantoprazole (PROTONIX) 40 MG tablet   PARoxetine (PAXIL) 10 MG tablet   polyethylene glycol (MIRALAX / GLYCOLAX) 17 g packet   promethazine (PHENERGAN) 12.5 MG tablet   promethazine (PHENERGAN) 25 MG suppository   senna-docusate (SENOKOT-S) 8.6-50 MG tablet   terazosin (HYTRIN) 1 MG capsule   traZODone (DESYREL) 100 MG tablet   Vitamin D, Ergocalciferol, (DRISDOL) 1.25 MG (50000 UNIT) CAPS capsule   [Paused] aspirin EC 81 MG tablet    Shonna Chock, PA-C Surgical Short Stay/Anesthesiology [facility] Phone [phone] [facility] Phone [phone] 01/17/2024 11:39 AM

## 2024-01-17 NOTE — Progress Notes (Signed)
 PCP - Dr. Maryjean Ka Cardiologist - Pt saw Dr. Rosemary Holms in 2022  PPM/ICD - denies   Chest x-ray - 08/02/23 EKG - 11/01/23 Stress Test - denies ECHO - 07/04/21 Cardiac Cath - denies  CPAP - denies  Fasting Blood Sugar - 80's-90's Checks Blood Sugar once/day  Blood Thinner Instructions: n/a Aspirin Instructions: pt has held ASA since 2/25 due to bleeding (r/t suprapubic cath)  ERAS Protcol - no, NPO  COVID TEST- n/a  Anesthesia review: yes, Revonda Standard wrote note on 2/13.  Patient verbally denies any shortness of breath, fever, cough and chest pain during phone call  Pt doesn't have skull flap yet, per wife. She said he will need a helmet when transferring. She said pt is bed-ridden. He has a G-tube that is not being used. He has a sacral wound (WOC nurse is doing wet to dry dressings).

## 2024-01-17 NOTE — Progress Notes (Signed)
 PCP - Dr. Maryjean Ka Cardiologist - Pt saw Dr. Rosemary Holms in 2022  PPM/ICD - denies   Chest x-ray - 08/02/23 EKG - 11/01/23 Stress Test - denies ECHO - 07/04/21 Cardiac Cath - denies  CPAP - denies  Fasting Blood Sugar - 90's Checks Blood Sugar once/day  Blood Thinner Instructions: n/a Aspirin Instructions: continue  ERAS Protcol - no, NPO  COVID TEST- n/a  Anesthesia review: yes  Patient's wife verbally denies pt has any shortness of breath, fever, cough and chest pain during phone call     Questions were answered for pt's wife. [name] verbalized understanding of instructions.   Pt doesn't have skull flap yet, per wife. She said he will need a helmet when transferring. She said pt is bed-ridden. He has a G-tube that is not being used. He has a sacral wound (WOC nurse is doing wet to dry dressings).

## 2024-01-17 NOTE — Pre-Procedure Instructions (Signed)
 -------------  SDW INSTRUCTIONS given:  Your procedure is scheduled on 3/11.  Report to [facility] Main Entrance "A" at 07:30 A.M., and check in at the Admitting office.  Any questions or running late day of surgery: call [phone]    Remember:  Do not eat or drink after midnight the night before your surgery     Take these medicines the morning of surgery with A SIP OF WATER  Amiodarone Amlodipine Carvedilol Proscar Hydralazine Keppra  robaxin  Protonix Paxil    If needed: Tylenol  zofran phenergan  As of today, STOP taking any Aleve, Naproxen, Ibuprofen, Motrin, Advil, Goody's, BC's, all herbal medications, fish oil, and all vitamins.  WHAT DO I DO ABOUT MY DIABETES MEDICATION?   Do not take Glipizide the morning of surgery.  THE NIGHT BEFORE SURGERY, take 2.5 units (50%) of Lantus.   HOW TO MANAGE YOUR DIABETES BEFORE AND AFTER SURGERY  Why is it important to control my blood sugar before and after surgery? Improving blood sugar levels before and after surgery helps healing and can limit problems. A way of improving blood sugar control is eating a healthy diet by:  Eating less sugar and carbohydrates  Increasing activity/exercise  Talking with your doctor about reaching your blood sugar goals High blood sugars (greater than 180 mg/dL) can raise your risk of infections and slow your recovery, so you will need to focus on controlling your diabetes during the weeks before surgery. Make sure that the doctor who takes care of your diabetes knows about your planned surgery including the date and location.  How do I manage my blood sugar before surgery? Check your blood sugar at least 4 times a day, starting 2 days before surgery, to make sure that the level is not too high or low.  Check your blood sugar the morning of your surgery when you wake up and every 2 hours until you get to the Short Stay unit.  If your blood sugar is less than 70 mg/dL, you will need  to treat for low blood sugar: Do not take insulin. Treat a low blood sugar (less than 70 mg/dL) with  cup of clear juice (cranberry or apple), 4 glucose tablets, OR glucose gel. Recheck blood sugar in 15 minutes after treatment (to make sure it is greater than 70 mg/dL). If your blood sugar is not greater than 70 mg/dL on recheck, call [phone] for further instructions. Report your blood sugar to the short stay nurse when you get to Short Stay.  If you are admitted to the hospital after surgery: Your blood sugar will be checked by the staff and you will probably be given insulin after surgery (instead of oral diabetes medicines) to make sure you have good blood sugar levels. The goal for blood sugar control after surgery is 80-180 mg/dL.   Do NOT Smoke (Tobacco/Vaping) 24 hours prior to your procedure  If you use a CPAP at night, you may bring all equipment for your overnight stay.     You will be asked to remove any contacts, glasses, piercing's, hearing aid's, dentures/partials prior to surgery. Please bring cases for these items if needed.     Patients discharged the day of surgery will not be allowed to drive home, and someone needs to stay with them for 24 hours.  SURGICAL WAITING ROOM VISITATION Patients may have no more than 2 support people in the waiting area - these visitors may rotate.   Pre-op nurse will coordinate an appropriate  time for 1 ADULT support person, who may not rotate, to accompany patient in pre-op.  Children under the age of 74 must have an adult with them who is not the patient and must remain in the main waiting area with an adult.  If the patient needs to stay at the hospital during part of their recovery, the visitor guidelines for inpatient rooms apply.  Please refer to the [facility] website for the visitor guidelines for any additional information.   Special instructions:   [facility]- Preparing For Surgery   Please follow these instructions  carefully.   Shower the NIGHT BEFORE SURGERY and the MORNING OF SURGERY with DIAL Soap.   Pat yourself dry with a CLEAN TOWEL.  Wear CLEAN PAJAMAS to bed the night before surgery  Place CLEAN SHEETS on your bed the night of your first shower and DO NOT SLEEP WITH PETS.   Additional instructions for the day of surgery: DO NOT APPLY any lotions, deodorants, cologne, or perfumes.   Do not wear jewelry or makeup Do not wear nail polish, gel polish, artificial nails, or any other type of covering on natural nails (fingers and toes) Do not bring valuables to the hospital. [facility] is not responsible for valuables/personal belongings. Put on clean/comfortable clothes.  Please brush your teeth.  Ask your nurse before applying any prescription medications to the skin.    Questions were answered. Patient verbalized understanding of instructions.

## 2024-01-18 ENCOUNTER — Encounter (HOSPITAL_COMMUNITY)
Admission: RE | Disposition: A | Discharge status: Home / Self Care | Payer: Self-pay | Source: Home / Self Care | Attending: Vascular Surgery

## 2024-01-18 ENCOUNTER — Encounter (HOSPITAL_COMMUNITY): Payer: Self-pay | Admitting: Vascular Surgery

## 2024-01-18 ENCOUNTER — Other Ambulatory Visit: Payer: Self-pay

## 2024-01-18 ENCOUNTER — Ambulatory Visit (HOSPITAL_COMMUNITY)
Admission: RE | Admit: 2024-01-18 | Discharge: 2024-01-18 | Disposition: A | Discharge status: Home / Self Care | Payer: 59 | Attending: Vascular Surgery | Admitting: Vascular Surgery

## 2024-01-18 ENCOUNTER — Ambulatory Visit (HOSPITAL_BASED_OUTPATIENT_CLINIC_OR_DEPARTMENT_OTHER): Payer: Self-pay | Admitting: Vascular Surgery

## 2024-01-18 ENCOUNTER — Ambulatory Visit (HOSPITAL_COMMUNITY): Payer: Self-pay | Admitting: Vascular Surgery

## 2024-01-18 DIAGNOSIS — Z993 Dependence on wheelchair: Secondary | ICD-10-CM | POA: Diagnosis not present

## 2024-01-18 DIAGNOSIS — I120 Hypertensive chronic kidney disease with stage 5 chronic kidney disease or end stage renal disease: Secondary | ICD-10-CM | POA: Insufficient documentation

## 2024-01-18 DIAGNOSIS — Z7984 Long term (current) use of oral hypoglycemic drugs: Secondary | ICD-10-CM | POA: Diagnosis not present

## 2024-01-18 DIAGNOSIS — Z9889 Other specified postprocedural states: Secondary | ICD-10-CM | POA: Diagnosis not present

## 2024-01-18 DIAGNOSIS — I69354 Hemiplegia and hemiparesis following cerebral infarction affecting left non-dominant side: Secondary | ICD-10-CM | POA: Diagnosis not present

## 2024-01-18 DIAGNOSIS — Z992 Dependence on renal dialysis: Secondary | ICD-10-CM | POA: Insufficient documentation

## 2024-01-18 DIAGNOSIS — N186 End stage renal disease: Secondary | ICD-10-CM | POA: Diagnosis not present

## 2024-01-18 DIAGNOSIS — N185 Chronic kidney disease, stage 5: Secondary | ICD-10-CM | POA: Diagnosis not present

## 2024-01-18 DIAGNOSIS — Z7401 Bed confinement status: Secondary | ICD-10-CM | POA: Diagnosis not present

## 2024-01-18 DIAGNOSIS — E1122 Type 2 diabetes mellitus with diabetic chronic kidney disease: Secondary | ICD-10-CM | POA: Diagnosis not present

## 2024-01-18 DIAGNOSIS — D631 Anemia in chronic kidney disease: Secondary | ICD-10-CM | POA: Insufficient documentation

## 2024-01-18 DIAGNOSIS — Z794 Long term (current) use of insulin: Secondary | ICD-10-CM | POA: Diagnosis not present

## 2024-01-18 DIAGNOSIS — Z931 Gastrostomy status: Secondary | ICD-10-CM | POA: Insufficient documentation

## 2024-01-18 DIAGNOSIS — Z7982 Long term (current) use of aspirin: Secondary | ICD-10-CM | POA: Diagnosis not present

## 2024-01-18 DIAGNOSIS — Z79899 Other long term (current) drug therapy: Secondary | ICD-10-CM | POA: Insufficient documentation

## 2024-01-18 DIAGNOSIS — L89159 Pressure ulcer of sacral region, unspecified stage: Secondary | ICD-10-CM | POA: Insufficient documentation

## 2024-01-18 DIAGNOSIS — K219 Gastro-esophageal reflux disease without esophagitis: Secondary | ICD-10-CM | POA: Diagnosis not present

## 2024-01-18 DIAGNOSIS — Z89512 Acquired absence of left leg below knee: Secondary | ICD-10-CM | POA: Diagnosis not present

## 2024-01-18 HISTORY — PX: AV FISTULA PLACEMENT: SHX1204

## 2024-01-18 LAB — POCT I-STAT, CHEM 8
BUN: 27 mg/dL — ABNORMAL HIGH (ref 6–20)
Calcium, Ion: 1.1 mmol/L — ABNORMAL LOW (ref 1.15–1.40)
Chloride: 99 mmol/L (ref 98–111)
Creatinine, Ser: 1.8 mg/dL — ABNORMAL HIGH (ref 0.61–1.24)
Glucose, Bld: 111 mg/dL — ABNORMAL HIGH (ref 70–99)
HCT: 32 % — ABNORMAL LOW (ref 39.0–52.0)
Hemoglobin: 10.9 g/dL — ABNORMAL LOW (ref 13.0–17.0)
Potassium: 3.8 mmol/L (ref 3.5–5.1)
Sodium: 135 mmol/L (ref 135–145)
TCO2: 29 mmol/L (ref 22–32)

## 2024-01-18 LAB — GLUCOSE, CAPILLARY
Glucose-Capillary: 102 mg/dL — ABNORMAL HIGH (ref 70–99)
Glucose-Capillary: 113 mg/dL — ABNORMAL HIGH (ref 70–99)
Glucose-Capillary: 116 mg/dL — ABNORMAL HIGH (ref 70–99)

## 2024-01-18 SURGERY — ARTERIOVENOUS (AV) FISTULA CREATION
Anesthesia: Monitor Anesthesia Care | Site: Arm Upper | Laterality: Left

## 2024-01-18 MED ORDER — LIDOCAINE-EPINEPHRINE (PF) 1 %-1:200000 IJ SOLN
INTRAMUSCULAR | Status: AC
  Filled 2024-01-18: qty 30

## 2024-01-18 MED ORDER — ONDANSETRON HCL 4 MG/2ML IJ SOLN
INTRAMUSCULAR | Status: AC
  Filled 2024-01-18: qty 2

## 2024-01-18 MED ORDER — PROPOFOL 500 MG/50ML IV EMUL
INTRAVENOUS | Status: DC | PRN
  Administered 2024-01-18 (×5): 10 mg via INTRAVENOUS

## 2024-01-18 MED ORDER — VASOPRESSIN 20 UNIT/ML IV SOLN
INTRAVENOUS | Status: AC
  Filled 2024-01-18: qty 1

## 2024-01-18 MED ORDER — LACTATED RINGERS IV SOLN: INTRAVENOUS | Status: DC | PRN

## 2024-01-18 MED ORDER — SODIUM CHLORIDE 0.9% FLUSH: 3.0000 mL | Freq: Two times a day (BID) | INTRAVENOUS | Status: DC

## 2024-01-18 MED ORDER — ONDANSETRON HCL 4 MG/2ML IJ SOLN
4.0000 mg | Freq: Once | INTRAMUSCULAR | Status: AC
  Administered 2024-01-18: 4 mg via INTRAVENOUS

## 2024-01-18 MED ORDER — CEFAZOLIN SODIUM-DEXTROSE 2-4 GM/100ML-% IV SOLN
2.0000 g | INTRAVENOUS | Status: AC
  Administered 2024-01-18: 2 g via INTRAVENOUS
  Filled 2024-01-18: qty 100

## 2024-01-18 MED ORDER — LIDOCAINE HCL (PF) 1 % IJ SOLN
INTRAMUSCULAR | Status: AC
  Filled 2024-01-18: qty 30

## 2024-01-18 MED ORDER — HEPARIN 6000 UNIT IRRIGATION SOLUTION
Status: DC | PRN
  Administered 2024-01-18: 1

## 2024-01-18 MED ORDER — FENTANYL CITRATE (PF) 100 MCG/2ML IJ SOLN: 25.0000 ug | INTRAMUSCULAR | Status: DC | PRN

## 2024-01-18 MED ORDER — SODIUM CHLORIDE 0.9 % IV SOLN: INTRAVENOUS | Status: DC

## 2024-01-18 MED ORDER — HEPARIN 6000 UNIT IRRIGATION SOLUTION
Status: AC
  Filled 2024-01-18: qty 500

## 2024-01-18 MED ORDER — CHLORHEXIDINE GLUCONATE 0.12 % MT SOLN
15.0000 mL | Freq: Once | OROMUCOSAL | Status: AC
  Administered 2024-01-18: 15 mL via OROMUCOSAL
  Filled 2024-01-18: qty 15

## 2024-01-18 MED ORDER — CHLORHEXIDINE GLUCONATE 4 % EX SOLN: 60.0000 mL | Freq: Once | CUTANEOUS | Status: DC

## 2024-01-18 MED ORDER — SODIUM CHLORIDE 0.9% FLUSH: 3.0000 mL | INTRAVENOUS | Status: DC | PRN

## 2024-01-18 MED ORDER — CHLORHEXIDINE GLUCONATE 4 % EX SOLN
60.0000 mL | Freq: Once | CUTANEOUS | Status: DC
Start: 2024-01-18 — End: 2024-01-18

## 2024-01-18 MED ORDER — 0.9 % SODIUM CHLORIDE (POUR BTL) OPTIME
TOPICAL | Status: DC | PRN
  Administered 2024-01-18: 1000 mL

## 2024-01-18 MED ORDER — FENTANYL CITRATE (PF) 100 MCG/2ML IJ SOLN
INTRAMUSCULAR | Status: AC
  Filled 2024-01-18: qty 2

## 2024-01-18 MED ORDER — INSULIN ASPART 100 UNIT/ML IJ SOLN: 0.0000 [IU] | INTRAMUSCULAR | Status: DC | PRN

## 2024-01-18 MED ORDER — FENTANYL CITRATE (PF) 100 MCG/2ML IJ SOLN
INTRAMUSCULAR | Status: DC | PRN
  Administered 2024-01-18 (×2): 25 ug via INTRAVENOUS

## 2024-01-18 MED ORDER — FENTANYL CITRATE (PF) 250 MCG/5ML IJ SOLN
INTRAMUSCULAR | Status: AC
  Filled 2024-01-18: qty 5

## 2024-01-18 MED ORDER — LIDOCAINE-EPINEPHRINE (PF) 1 %-1:200000 IJ SOLN
INTRAMUSCULAR | Status: DC | PRN
  Administered 2024-01-18: 10 mL

## 2024-01-18 MED ORDER — OXYCODONE-ACETAMINOPHEN 5-325 MG PO TABS: 1.0000 | ORAL_TABLET | Freq: Four times a day (QID) | ORAL | 0 refills | Status: DC | PRN

## 2024-01-18 MED ORDER — ORAL CARE MOUTH RINSE: 15.0000 mL | Freq: Once | OROMUCOSAL | Status: AC

## 2024-01-18 SURGICAL SUPPLY — 27 items
ARMBAND PINK RESTRICT EXTREMIT (MISCELLANEOUS) ×1 IMPLANT
BAG COUNTER SPONGE SURGICOUNT (BAG) ×1 IMPLANT
CANISTER SUCT 3000ML PPV (MISCELLANEOUS) ×1 IMPLANT
CLIP LIGATING EXTRA MED SLVR (CLIP) ×1 IMPLANT
CLIP LIGATING EXTRA SM BLUE (MISCELLANEOUS) ×1 IMPLANT
COVER PROBE W GEL 5X96 (DRAPES) IMPLANT
DERMABOND ADVANCED .7 DNX12 (GAUZE/BANDAGES/DRESSINGS) ×1 IMPLANT
ELECT REM PT RETURN 9FT ADLT (ELECTROSURGICAL) ×1 IMPLANT
ELECTRODE REM PT RTRN 9FT ADLT (ELECTROSURGICAL) ×1 IMPLANT
GLOVE BIO SURGEON STRL SZ7.5 (GLOVE) ×1 IMPLANT
GOWN STRL REUS W/ TWL LRG LVL3 (GOWN DISPOSABLE) ×2 IMPLANT
GOWN STRL REUS W/ TWL XL LVL3 (GOWN DISPOSABLE) ×1 IMPLANT
INSERT FOGARTY SM (MISCELLANEOUS) IMPLANT
KIT BASIN OR (CUSTOM PROCEDURE TRAY) ×1 IMPLANT
KIT TURNOVER KIT B (KITS) ×1 IMPLANT
NS IRRIG 1000ML POUR BTL (IV SOLUTION) ×1 IMPLANT
PACK CV ACCESS (CUSTOM PROCEDURE TRAY) ×1 IMPLANT
PAD ARMBOARD 7.5X6 YLW CONV (MISCELLANEOUS) ×2 IMPLANT
POWDER SURGICEL 3.0 GRAM (HEMOSTASIS) IMPLANT
SLING ARM FOAM STRAP LRG (SOFTGOODS) IMPLANT
SLING ARM FOAM STRAP MED (SOFTGOODS) IMPLANT
SUT MNCRL AB 4-0 PS2 18 (SUTURE) ×1 IMPLANT
SUT PROLENE 6 0 BV (SUTURE) ×1 IMPLANT
SUT VIC AB 3-0 SH 27X BRD (SUTURE) ×1 IMPLANT
TOWEL GREEN STERILE (TOWEL DISPOSABLE) ×1 IMPLANT
UNDERPAD 30X36 HEAVY ABSORB (UNDERPADS AND DIAPERS) ×1 IMPLANT
WATER STERILE IRR 1000ML POUR (IV SOLUTION) ×1 IMPLANT

## 2024-01-18 NOTE — Op Note (Signed)
    Patient name: Bruce Little MRN: [id number] DOB: 1971/01/07 Sex: male  01/18/2024 Pre-operative Diagnosis: End-stage renal disease Post-operative diagnosis:  Same Surgeon:  Apolinar Junes C. Randie Heinz, MD Assistant: Clinton Gallant, PA Procedure Performed: Left brachial artery to cephalic vein AV fistula creation  Indications: 53 year old male now with end-stage renal disease on dialysis.  Catheter.  He is indicated for permanent dialysis access has paralysis of his left upper extremity we have agreed to proceed with fistula versus graft in the left upper arm.  Given the complexity of the case,  the assistant was necessary in order to expedite the procedure and safely perform the technical aspects of the operation.  The assistant provided traction and countertraction to assist with exposure of the artery and vein.  They also assisted with suture ligation of multiple venous branches.  They played a critical role in the anastomosis. These skills, especially following the Prolene suture for the anastomosis, could not have been adequately performed by a scrub tech assistant.    Findings: The cephalic vein at the antecubital measured approximately 4 mm and the brachial artery also measured 4 mm did have some disease and a limited endarterectomy was performed.  At completion there was a very strong thrill in the fistula traced with Doppler of the upper arm and a radial artery pulse at the wrist also confirmed with Doppler there was also an ulnar artery pulse the wrist that did not augment with compression of the fistula.   Procedure:  The patient was identified in the holding area and taken to the operating room where he was placed supine on upper table and MAC anesthesia was induced.  He was sterilely prepped and draped in the left upper extremity in the usual fashion, antibiotics were administered timeout was called.  We began using ultrasound identified very suitable cephalic and basilic veins below the antecubital  with a large brachial artery which appeared somewhat diseased.  The area was anesthetized 1% lidocaine with epinephrine and a transverse incision was created we dissected down identified the cephalic vein there was a basilic vein joining this and this was divided but should be suitable for fistula creation above the antecubitum.  We dissected to the deep fascia again anesthetized this area and then encircled the brachial artery which did appear disease with a vessel loop.  We then tied off and transected the vein distally marked this for orientation spatulated flushed with heparinized saline and clamped.  The artery was clamp distally and proximally opened longitudinally we did flush with heparinized saline in both directions.  A limited endarterectomy was performed the vein was spatulated and sewn into side with 6-0 Prolene suture.  Prior completion without flushing all directions.  Upon completion there was a very strong thrill in the fistula confirmed with Doppler of the upper arm and a palpable radial artery pulse at the wrist also confirmed with Doppler and there was a ulnar artery signal at the wrist as well.  Neither of these arterial signals augmented with compression of the fistula.  We then irrigated the wound obtaining stasis and closed in layers with Vicryl and Monocryl.  The patient was then awakened from anesthesia having tolerated the procedure without immediate complication.  All counts were correct at completion.   EBL: 20cc   Blaine Hari C. Randie Heinz, MD [facility] Office: [phone] Pager: [phone]

## 2024-01-18 NOTE — Discharge Instructions (Signed)

## 2024-01-18 NOTE — Transfer of Care (Signed)
 Immediate Anesthesia Transfer of Care Note  Patient: Michael Boston Isaacson  Procedure(s) Performed: LEFT ARM ARTERIOVENOUS (AV) FISTULA CREATION (Left: Arm Upper)  Patient Location: PACU  Anesthesia Type:MAC  Level of Consciousness: awake, alert , and oriented  Airway & Oxygen Therapy: Patient Spontanous Breathing  Post-op Assessment: Report given to RN and Post -op Vital signs reviewed and stable  Post vital signs: Reviewed and stable  Last Vitals:  Vitals Value Taken Time  BP 107/68 01/18/24 1147  Temp 36.8 C 01/18/24 1147  Pulse 82 01/18/24 1152  Resp 22 01/18/24 1152  SpO2 96 % 01/18/24 1152  Vitals shown include unfiled device data.  Last Pain:  Vitals:   01/18/24 1147  TempSrc:   PainSc: 0-No pain         Complications: No notable events documented.

## 2024-01-18 NOTE — H&P (Signed)
 HPI:   Bruce Little is a 53 y.o. male history of stroke currently wheelchair-bound with paralysis of his left upper extremity.  He now has end-stage renal disease on dialysis via right IJ catheter.  He is here today to discuss peritoneal dialysis versus hemodialysis permanent access.  Patient does have a G-tube and possibly will require suprapubic Foley catheter as he currently has an indwelling catheter.  He previously has had his tracheostomy this has been removed he has also had craniectomy and the bone flap has not been replaced.  He does take aspirin for life who seeks on his behalf does not know of any blood thinners.       Past Medical History:  Diagnosis Date   A-fib (HCC)     Anemia      low iron   Chronic kidney disease     COVID      has had it 2 times, one mild and one wasn't   DM2 (diabetes mellitus, type 2) (HCC)     History of blood transfusion     HTN (hypertension)     Osteomyelitis of fifth toe of left foot (HCC) 08/13/2021   Osteomyelitis of fourth toe of left foot (HCC) 08/13/2021   Pneumonia     Stroke (HCC) 05/19/2021    no residual effects.   Stroke [facility])               Family History  Problem Relation Age of Onset   Stroke Mother     Cancer Mother     Heart disease Father               Past Surgical History:  Procedure Laterality Date   AMPUTATION Left 08/29/2021    Procedure: AMPUTATION OF FOURTH TOE AND RAY ALONG WITH REMAINING FITH METATARSAL;  Surgeon: Tarry Kos, MD;  Location: [facility] OR;  Service: Orthopedics;  Laterality: Left;   AMPUTATION Left 09/03/2021    Procedure: LISFRANC AMPUTATION;  Surgeon: Tarry Kos, MD;  Location: [facility] OR;  Service: Orthopedics;  Laterality: Left;   AMPUTATION Left 10/29/2021    Procedure: AMPUTATION BELOW KNEE -LEFT;  Surgeon: Tarry Kos, MD;  Location: [facility] OR;  Service: Orthopedics;  Laterality: Left;   APPLICATION OF WOUND VAC Left 07/07/2021    Procedure: APPLICATION OF WOUND VAC;  Surgeon: Tarry Kos, MD;  Location: [facility] OR;  Service: Orthopedics;  Laterality: Left;   APPLICATION OF WOUND VAC Left 08/29/2021    Procedure: APPLICATION OF WOUND VAC;  Surgeon: Tarry Kos, MD;  Location: [facility] OR;  Service: Orthopedics;  Laterality: Left;   I & D EXTREMITY Left 07/03/2021    Procedure: IRRIGATION AND DEBRIDEMENT ,FIFTH RAY  AMPUTATION LEFT FOOT, , WOUND VAC PLACEMENT;  Surgeon: Tarry Kos, MD;  Location: [facility];  Service: Orthopedics;  Laterality: Left;   I & D EXTREMITY Left 07/07/2021    Procedure: IRRIGATION AND DEBRIDEMENT LEFT FOOT;  Surgeon: Tarry Kos, MD;  Location: [facility];  Service: Orthopedics;  Laterality: Left;   I & D EXTREMITY Left 08/29/2021    Procedure: IRRIGATION AND DEBRIDEMENT LEFT FOOT;  Surgeon: Tarry Kos, MD;  Location: [facility];  Service: Orthopedics;  Laterality: Left;   IR FLUORO GUIDE CV LINE RIGHT   07/09/2021   IR REMOVAL TUN CV CATH W/O FL   10/08/2021   IR REMOVAL TUN CV CATH W/O FL   07/13/2023   IR REPLACE G-TUBE SIMPLE WO FLUORO  09/20/2023   IR US GUIDE VASC ACCESS RIGHT   07/09/2021   VITRECTOMY Left      [place] eye          Short Social History:  Social History         Tobacco Use   Smoking status: Never   Smokeless tobacco: Never  Substance Use Topics   Alcohol use: Not Currently      Comment: rare      Allergies  No Known Allergies           Current Outpatient Medications  Medication Sig Dispense Refill   amiodarone (PACERONE) 100 MG tablet Take 100 mg by mouth daily.       amLODipine (NORVASC) 10 MG tablet Take 10 mg by mouth daily.       ascorbic acid (VITAMIN C) 500 MG tablet Take 1 tablet (500 mg total) by mouth 2 (two) times daily. 60 tablet 0   ASHWAGANDHA PO Take 2 tablets by mouth daily as needed.       atorvastatin (LIPITOR) 40 MG tablet Take 40 mg by mouth at bedtime.       carvedilol (COREG) 25 MG tablet Take 1 tablet (25 mg total) by mouth 2 (two) times daily with a meal. 60 tablet 0   cloNIDine (CATAPRES) 0.1 MG  tablet Take 1 tablet (0.1 mg total) by mouth daily. 30 tablet 0   diphenhydramine-acetaminophen (TYLENOL PM) 25-500 MG TABS tablet Take 2 tablets by mouth at bedtime as needed.       finasteride (PROSCAR) 5 MG tablet Take 5 mg by mouth daily.       food thickener (SIMPLYTHICK, NECTAR/LEVEL 2/MILDLY THICK,) GEL Take 1 packet by mouth as needed. 200 packet 0   furosemide (LASIX) 40 MG tablet Take 40 mg by mouth daily.       hydrALAZINE (APRESOLINE) 25 MG tablet Take 25 mg by mouth every 8 (eight) hours.       LANTUS SOLOSTAR 100 UNIT/ML Solostar Pen Inject 5 Units into the skin daily. 15 mL 0   levETIRAcetam (KEPPRA) 750 MG tablet Take 1,500 mg by mouth 2 (two) times daily.       losartan (COZAAR) 50 MG tablet Take 1 tablet (50 mg total) by mouth daily. 30 tablet 0   melatonin 5 MG TABS Take 2 tablets by mouth at bedtime.       methocarbamol (ROBAXIN) 500 MG tablet Take 1 tablet (500 mg total) by mouth every 8 (eight) hours as needed for muscle spasms. 30 tablet 0   multivitamin (RENA-VIT) TABS tablet Take 1 tablet by mouth at bedtime. 30 tablet 0   pantoprazole (PROTONIX) 40 MG tablet Take 1 tablet (40 mg total) by mouth daily. 30 tablet 0   PARoxetine (PAXIL) 10 MG tablet Take 10 mg by mouth daily.       polyethylene glycol (MIRALAX / GLYCOLAX) 17 g packet Take 17 g by mouth 2 (two) times daily. 60 each 0   promethazine (PHENERGAN) 12.5 MG tablet Take 1 tablet (12.5 mg total) by mouth every 6 (six) hours as needed for nausea or vomiting. 30 tablet 0   senna-docusate (SENOKOT-S) 8.6-50 MG tablet Take 2 tablets by mouth 2 (two) times daily. 120 tablet 0   terazosin (HYTRIN) 1 MG capsule Take 1 mg by mouth at bedtime.       Vitamin D, Ergocalciferol, (DRISDOL) 1.25 MG (50000 UNIT) CAPS capsule Take 1 capsule (50,000 Units total) by mouth every 7 (seven) days. (Patient  taking differently: Take 50,000 Units by mouth every 7 (seven) days. On Thursdays) 4 capsule 0   glipiZIDE (GLUCOTROL) 5 MG tablet  Take 1 tablet (5 mg total) by mouth daily. 30 tablet 0      No current facility-administered medications for this visit.        Review of Systems  Constitutional:  Constitutional negative. HENT: HENT negative.  Eyes: Eyes negative.  Cardiovascular: Cardiovascular negative.  GI: Gastrointestinal negative.  GU:       Indwelling foley Skin: Skin negative.  Neurological: Positive for facial asymmetry, focal weakness, numbness and speech difficulty.  Hematologic: Hematologic/lymphatic negative.  Psychiatric: Psychiatric negative.          Objective:   Vitals:   01/18/24 0800  BP: 135/79  Pulse: 86  Resp: 18  Temp: 98.3 F (36.8 C)  SpO2: 96%      Physical Exam Neck:     Comments: Right internal jugular tdc Cardiovascular:     Comments: Bilateral brachial pulses palpable Abdominal:     General: Abdomen is flat.     Comments: G tube in place  Neurological:     Mental Status: He is alert.     Sensory: Sensory deficit present.     Comments: Paralysis LUE        Data: Right Cephalic   Diameter (cm)Depth (cm)Findings   +-----------------+-------------+----------+---------+  Shoulder            0.46        1.22              +-----------------+-------------+----------+---------+  Prox upper arm       0.36        0.59              +-----------------+-------------+----------+---------+  Mid upper arm        0.37        0.36              +-----------------+-------------+----------+---------+  Dist upper arm       0.40        0.42   branching  +-----------------+-------------+----------+---------+  Antecubital fossa    0.63        0.15   branching  +-----------------+-------------+----------+---------+  Prox forearm         0.45        0.46              +-----------------+-------------+----------+---------+  Mid forearm          0.41        0.41              +-----------------+-------------+----------+---------+  Dist  forearm         0.34        0.22              +-----------------+-------------+----------+---------+  Wrist               0.37        0.22   branching  +-----------------+-------------+----------+---------+   +-----------------+-------------+----------+--------------+  Right Basilic    Diameter (cm)Depth (cm)   Findings     +-----------------+-------------+----------+--------------+  Shoulder                               not visualized  +-----------------+-------------+----------+--------------+  Prox upper arm  not visualized  +-----------------+-------------+----------+--------------+  Mid upper arm                           not visualized  +-----------------+-------------+----------+--------------+  Dist upper arm       0.80        1.33     branching     +-----------------+-------------+----------+--------------+  Antecubital fossa    0.78        1.50     branching     +-----------------+-------------+----------+--------------+  Prox forearm         0.42        0.18     branching     +-----------------+-------------+----------+--------------+  Mid forearm          0.41        0.18                   +-----------------+-------------+----------+--------------+  Distal forearm       0.34        0.22                   +-----------------+-------------+----------+--------------+  Wrist               0.30        0.21                   +-----------------+-------------+----------+--------------+   +-----------------+-------------+----------+---------+  Left Cephalic    Diameter (cm)Depth (cm)Findings   +-----------------+-------------+----------+---------+  Shoulder            0.30        1.68              +-----------------+-------------+----------+---------+  Prox upper arm       0.30        0.90              +-----------------+-------------+----------+---------+  Mid upper arm         0.30        0.43              +-----------------+-------------+----------+---------+  Dist upper arm       0.29        0.52   branching  +-----------------+-------------+----------+---------+  Antecubital fossa    0.47        0.53   branching  +-----------------+-------------+----------+---------+  Prox forearm         0.19        0.71   branching  +-----------------+-------------+----------+---------+  Mid forearm          0.24        0.41              +-----------------+-------------+----------+---------+  Dist forearm         0.15        0.21   branching  +-----------------+-------------+----------+---------+  Wrist               0.15        0.23              +-----------------+-------------+----------+---------+   +-----------------+-------------+----------+--------------+  Left Basilic     Diameter (cm)Depth (cm)   Findings     +-----------------+-------------+----------+--------------+  Shoulder                               not visualized  +-----------------+-------------+----------+--------------+  Prox upper arm       0.51  1.47     branching     +-----------------+-------------+----------+--------------+  Mid upper arm        0.39        1.14                   +-----------------+-------------+----------+--------------+  Dist upper arm       0.46        0.79                   +-----------------+-------------+----------+--------------+  Antecubital fossa    0.70        0.39     branching     +-----------------+-------------+----------+--------------+  Prox forearm         0.33        0.32                   +-----------------+-------------+----------+--------------+  Mid forearm          0.30        0.35                   +-----------------+-------------+----------+--------------+  Distal forearm       0.26        0.25                    +-----------------+-------------+----------+--------------+  Wrist               0.30        0.23     branching     +-----------------+-------------+----------+--------------+        Assessment/Plan:    53 year old male with end-stage renal disease on dialysis Tuesdays, Thursdays and Saturdays via right IJ tunneled catheter.  We have discussed his options for permanent dialysis access which include peritoneal versus hemodialysis.  Unfortunately given his presence of a G-tube on possible need for suprapubic Foley catheter I think he would be exceedingly high risk for peritoneal dialysis catheter placement.  As such we have discussed hemodialysis access and given that his left arm is paralyzed we will plan for left upper arm AV fistula versus graft.  We again discussed the risks and benefits and consent was signed.   Amed Datta C. Randie Heinz, MD [facility] Office: [phone] Pager: [phone]

## 2024-01-19 ENCOUNTER — Ambulatory Visit (HOSPITAL_COMMUNITY)
Admission: RE | Admit: 2024-01-19 | Discharge: 2024-01-19 | Disposition: A | Discharge status: Home / Self Care | Source: Ambulatory Visit | Attending: Neurosurgery | Admitting: Neurosurgery

## 2024-01-19 ENCOUNTER — Encounter (HOSPITAL_COMMUNITY): Payer: Self-pay | Admitting: Vascular Surgery

## 2024-01-19 DIAGNOSIS — M952 Other acquired deformity of head: Secondary | ICD-10-CM | POA: Insufficient documentation

## 2024-01-19 NOTE — Anesthesia Postprocedure Evaluation (Signed)
 Anesthesia Post Note  Patient: Bruce Little  Procedure(s) Performed: LEFT ARM ARTERIOVENOUS (AV) FISTULA CREATION (Left: Arm Upper)     Patient location during evaluation: PACU Anesthesia Type: MAC Level of consciousness: awake and alert Pain management: pain level controlled Vital Signs Assessment: post-procedure vital signs reviewed and stable Respiratory status: spontaneous breathing, nonlabored ventilation, respiratory function stable and patient connected to nasal cannula oxygen Cardiovascular status: stable and blood pressure returned to baseline Postop Assessment: no apparent nausea or vomiting Anesthetic complications: no   No notable events documented.  Last Vitals:  Vitals:   01/18/24 1200 01/18/24 1211  BP: 128/71 115/74  Pulse: 84 83  Resp: 17 15  Temp:  36.7 C  SpO2: 96% 98%    Last Pain:  Vitals:   01/18/24 1147  TempSrc:   PainSc: 0-No pain                 Kennieth Rad

## 2024-01-21 ENCOUNTER — Other Ambulatory Visit (HOSPITAL_COMMUNITY): Payer: Self-pay | Admitting: Internal Medicine

## 2024-01-21 ENCOUNTER — Encounter (HOSPITAL_BASED_OUTPATIENT_CLINIC_OR_DEPARTMENT_OTHER): Admitting: Internal Medicine

## 2024-01-21 DIAGNOSIS — L89154 Pressure ulcer of sacral region, stage 4: Secondary | ICD-10-CM

## 2024-01-21 DIAGNOSIS — S80811A Abrasion, right lower leg, initial encounter: Secondary | ICD-10-CM

## 2024-01-21 DIAGNOSIS — L89324 Pressure ulcer of left buttock, stage 4: Secondary | ICD-10-CM | POA: Diagnosis not present

## 2024-01-21 DIAGNOSIS — L89610 Pressure ulcer of right heel, unstageable: Secondary | ICD-10-CM | POA: Diagnosis not present

## 2024-01-21 DIAGNOSIS — E11622 Type 2 diabetes mellitus with other skin ulcer: Secondary | ICD-10-CM | POA: Diagnosis not present

## 2024-01-24 ENCOUNTER — Encounter (HOSPITAL_COMMUNITY)

## 2024-01-26 ENCOUNTER — Ambulatory Visit (HOSPITAL_COMMUNITY)
Admission: RE | Admit: 2024-01-26 | Discharge: 2024-01-26 | Disposition: A | Discharge status: Home / Self Care | Source: Ambulatory Visit | Attending: Internal Medicine | Admitting: Internal Medicine

## 2024-01-26 DIAGNOSIS — L89610 Pressure ulcer of right heel, unstageable: Secondary | ICD-10-CM | POA: Insufficient documentation

## 2024-02-04 ENCOUNTER — Encounter (HOSPITAL_BASED_OUTPATIENT_CLINIC_OR_DEPARTMENT_OTHER): Admitting: Internal Medicine

## 2024-02-04 DIAGNOSIS — L89892 Pressure ulcer of other site, stage 2: Secondary | ICD-10-CM

## 2024-02-04 DIAGNOSIS — L89154 Pressure ulcer of sacral region, stage 4: Secondary | ICD-10-CM

## 2024-02-04 DIAGNOSIS — L89610 Pressure ulcer of right heel, unstageable: Secondary | ICD-10-CM

## 2024-02-04 DIAGNOSIS — L89324 Pressure ulcer of left buttock, stage 4: Secondary | ICD-10-CM

## 2024-02-04 DIAGNOSIS — E11622 Type 2 diabetes mellitus with other skin ulcer: Secondary | ICD-10-CM | POA: Diagnosis not present

## 2024-02-07 ENCOUNTER — Encounter: Payer: Self-pay | Admitting: Internal Medicine

## 2024-02-07 ENCOUNTER — Other Ambulatory Visit: Payer: Self-pay

## 2024-02-07 ENCOUNTER — Ambulatory Visit (INDEPENDENT_AMBULATORY_CARE_PROVIDER_SITE_OTHER): Admitting: Internal Medicine

## 2024-02-07 VITALS — BP 99/67 | HR 69 | Temp 97.7°F | Resp 16

## 2024-02-07 DIAGNOSIS — L89159 Pressure ulcer of sacral region, unspecified stage: Secondary | ICD-10-CM | POA: Diagnosis not present

## 2024-02-07 DIAGNOSIS — M4628 Osteomyelitis of vertebra, sacral and sacrococcygeal region: Secondary | ICD-10-CM | POA: Diagnosis not present

## 2024-02-07 DIAGNOSIS — B965 Pseudomonas (aeruginosa) (mallei) (pseudomallei) as the cause of diseases classified elsewhere: Secondary | ICD-10-CM

## 2024-02-07 MED ORDER — VANCOMYCIN HCL 125 MG PO CAPS: 125.0000 mg | ORAL_CAPSULE | Freq: Two times a day (BID) | ORAL | 1 refills | Status: DC

## 2024-02-07 NOTE — Progress Notes (Addendum)
 [facility]      Reason for Consult: Osteomyelitis    Referring Physician: Dr. Mikey Bussing    Patient ID: Bruce Little, male    DOB: Jul 18, 1971, 53 y.o.   MRN: [id number]  HPI:   Bruce Little is here for evaluation of osteomyelitis. He has a history of intracranial bleed with a craniectomy done in [place] in June 2024 and end-stage renal disease on hemodialysis and atrial fibrillation here with positive bone culture at the site of his sacral decubitus ulcer.  He is followed by Dr. Mikey Bussing of wound care who has been helping with his multiple pressure ulcers.  He is here with his wife who is his caregiver.  She is helping with offloading, nutrition and reduction of soiling.  Cultures from bone grew out corynebacterium and Pseudomonas.   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 05/03/2023   CKD (chronic kidney disease) stage 5, GFR less than 15 ml/min (HCC) 07/02/2021   COVID    has had it 2 times, one mild and one wasn't   Depression    DM2 (diabetes mellitus, type 2) (HCC)    Family history of adverse reaction to anesthesia    Dad has a "hard time waking up" after anesthesia   GERD (gastroesophageal reflux disease)    Hemorrhagic stroke (HCC) 04/30/2023   s/p right decompressive craniectomy and evacuation of hematoma on 04/30/2023   History of blood transfusion    HTN (hypertension)    ICH (intracerebral hemorrhage) (HCC) 09/17/2023   Osteomyelitis of fifth toe of left foot (HCC) 08/13/2021   Osteomyelitis of fourth toe of left foot (HCC) 08/13/2021   Pneumonia    Sacral decubitus ulcer 05/2023   Stroke (HCC) 05/19/2021   unable to move left side    Prior to Admission medications   Medication Sig Start Date End Date Taking? Authorizing Provider   acetaminophen (TYLENOL) 500 MG tablet Take 1,000 mg by mouth every 6 (six) hours as needed for mild pain (pain score 1-3) or headache.   Yes [provider]  amiodarone (PACERONE) 100 MG tablet Take 100 mg by mouth daily.   Yes [provider]  amLODipine (NORVASC) 10 MG tablet Take 10 mg by mouth daily.   Yes [provider]  ascorbic acid (VITAMIN C) 500 MG tablet Take 1 tablet (500 mg total) by mouth 2 (two) times daily. 10/02/23  Yes [name], MD  ASHWAGANDHA PO Take 1 Dose by mouth at bedtime. 1 dose- 2 gummies   Yes [provider]  atorvastatin (LIPITOR) 40 MG tablet Take 40 mg by mouth at bedtime.   Yes [provider]  Calcium Carb-Cholecalciferol (CALCIUM 500 + D3 PO) Take 1 tablet by mouth daily.   Yes [provider]  cloNIDine (CATAPRES) 0.1 MG tablet Take 1 tablet (0.1 mg total) by mouth daily. 10/02/23  Yes [name], MD  diphenhydramine-acetaminophen (TYLENOL PM) 25-500 MG TABS tablet Take 2 tablets by mouth at bedtime as needed.   Yes [provider]  finasteride (PROSCAR) 5 MG tablet Take 5 mg by mouth daily.   Yes [provider]  furosemide (LASIX) 40 MG tablet Take 40 mg by mouth daily.   Yes [provider]  glipiZIDE (GLUCOTROL) 5 MG tablet Take 1 tablet (5 mg total) by mouth daily. 05/21/21 05/08/24 Yes Sheikh, Omair Latif, DO  hydrALAZINE (APRESOLINE) 25 MG tablet Take 25 mg by mouth 3 (three) times daily.   Yes [provider]  LANTUS SOLOSTAR 100 UNIT/ML Solostar Pen Inject 5 Units into the skin daily. Patient taking differently: Inject 5 Units into the skin at bedtime. 10/02/23  Yes [name], MD  levETIRAcetam (KEPPRA) 750 MG tablet Take 1,500 mg by mouth 2 (two) times daily.   Yes [provider]  losartan (COZAAR) 50 MG tablet Take 1 tablet (50 mg total) by mouth daily. 10/02/23  Yes [name], MD  melatonin 5 MG TABS Take 5-10  mg by mouth at bedtime as needed (sleep).   Yes [provider]  methocarbamol (ROBAXIN) 500 MG tablet Take 1 tablet (500 mg total) by mouth every 8 (eight) hours as needed for muscle spasms. Patient taking differently: Take 500 mg by mouth 2 (two) times daily. May take a third 500 mg dose midday as needed for muscle spasms 10/02/23  Yes [name], MD  multivitamin (RENA-VIT) TABS tablet Take 1 tablet by mouth at bedtime. 10/02/23  Yes [name], MD  mupirocin ointment (BACTROBAN) 2 % Apply 1 Application topically 2 (two) times daily. 12/24/23  Yes [provider]  NATURAL VITAMIN A PO Take 3,000 mcg by mouth daily.   Yes [provider]  ondansetron (ZOFRAN-ODT) 4 MG disintegrating tablet Take 4 mg by mouth every 8 (eight) hours as needed for nausea or vomiting.   Yes [provider]  oxyCODONE-acetaminophen (PERCOCET/ROXICET) 5-325 MG tablet Take 1 tablet by mouth every 6 (six) hours as needed. 01/18/24  Yes Lars Mage, PA-C  pantoprazole (PROTONIX) 40 MG tablet Take 1 tablet (40 mg total) by mouth daily. 05/22/21  Yes Sheikh, Omair Latif, DO  PARoxetine (PAXIL) 10 MG tablet Take 10 mg by mouth daily.   Yes [provider]  polyethylene glycol (MIRALAX / GLYCOLAX) 17 g packet Take 17 g by mouth 2 (two) times daily. Patient taking differently: Take 17 g by mouth daily as needed for moderate constipation. 11/03/23  Yes Osvaldo Shipper, MD  promethazine (PHENERGAN) 12.5 MG tablet Take 1 tablet (12.5 mg total) by mouth every 6 (six) hours as needed for nausea or vomiting. 01/08/24  Yes Carollee Herter, DO  promethazine (PHENERGAN) 25 MG suppository Place 1 suppository (25 mg total) rectally every 6 (six) hours as needed for nausea or vomiting. 01/08/24  Yes Carollee Herter, DO  senna-docusate (SENOKOT-S) 8.6-50 MG tablet Take 2 tablets by mouth 2 (two) times daily. Patient taking differently: Take 2 tablets by mouth 2 (two) times daily as needed for  mild constipation. 11/03/23  Yes Osvaldo Shipper, MD  terazosin (HYTRIN) 1 MG capsule Take 1 mg by mouth at bedtime.   Yes [provider]  traZODone (DESYREL) 100 MG tablet Take 1 tablet (100 mg total) by mouth at bedtime as needed for sleep. 01/08/24  Yes Carollee Herter, DO  Vitamin D, Ergocalciferol, (DRISDOL) 1.25 MG (50000 UNIT) CAPS capsule Take 1 capsule (50,000 Units total) by mouth every 7 (seven) days. 10/03/23  Yes [name], MD  aspirin EC 81 MG tablet Take 81 mg by mouth at bedtime. Swallow whole. Patient not taking: Reported on 02/07/2024    [provider]  carvedilol (COREG) 25 MG tablet Take 1 tablet (25 mg total) by mouth 2 (two) times  daily with a meal. 10/02/23   [name], MD    No Known Allergies  Social History   Tobacco Use   Smoking status: Never   Smokeless tobacco: Never  Vaping Use   Vaping status: Never Used  Substance Use Topics   Alcohol use: Not Currently   Drug use: Never    Family History  Problem Relation Age of Onset   Stroke Mother    Cancer Mother    Heart disease Father     Review of Systems  Constitutional: negative for fevers and chills All other systems reviewed and are negative    Constitutional: in no apparent distress  Vitals:   02/07/24 1009  BP: 99/67  Pulse: 69  Resp: 16  Temp: 97.7 F (36.5 C)  SpO2: 98%   EYES: anicteric Respiratory: Normal respiratory effort   Labs: Lab Results  Component Value Date   WBC 14.6 (H) 01/08/2024   HGB 10.9 (L) 01/18/2024   HCT 32.0 (L) 01/18/2024   MCV 89.8 01/08/2024   PLT 272 01/08/2024    Lab Results  Component Value Date   CREATININE 1.80 (H) 01/18/2024   BUN 27 (H) 01/18/2024   NA 135 01/18/2024   K 3.8 01/18/2024   CL 99 01/18/2024   CO2 26 01/08/2024    Lab Results  Component Value Date   ALT 26 01/07/2024   AST 20 01/07/2024   ALKPHOS 135 (H) 01/07/2024   BILITOT 0.6 01/07/2024   INR 1.1 09/18/2023     Assessment: He has  sacral ulcer with osteomyelitis based on bone culture that has grown corynebacterium and Pseudomonas.  Otherwise all efforts being made for healing including nutrition, offloading and reduction of soiling.  Since he is on dialysis will use vancomycin for the corynebacterium and have him take cefepime.  Unfortunately no cefepime sensitivities noted but we will start with cefepime and could change to oral ciprofloxacin if there is no ongoing improvement.  Could also consider line placement and meropenem but will monitor progress clinically.  I would suspect the cefepime will work.  Plan: 1) vancomycin and cefepime with dialysis 2) will check a sed rate weekly with his dialysis labs. 3) with his history of C diff, will use prophylactic oral vancomycin Will have him follow-up in about 3 to 4 weeks to check on progress.  Diagnosis: osteomyelitis  Culture Result: Corynebacterium and Pseudomonas  No Known Allergies  OPAT Orders Discharge antibiotics to be given via PICC line Discharge antibiotics: vancomycin 1 gram after dialysis IV + cefepime 2 grams after dialysis IV Per pharmacy protocol yes Duration: 6 weeks End Date: Mar 20 2024  [facility] Care Per Protocol: N/A  Home health RN for IV administration and teaching; PICC line care and labs.    Labs weekly while on IV antibiotics: __ CBC with differential __ BMP __ CMP __ CRP _x_ ESR __ Vancomycin trough __ CK    Fax weekly labs to [phone]

## 2024-02-08 ENCOUNTER — Telehealth: Payer: Self-pay

## 2024-02-08 NOTE — Telephone Encounter (Signed)
 Will fax over OPAT orders to [facility].   Dr.Comer can you put in OPAT orders and I will call over to [facility] to be sure orders are received.   Thanks.

## 2024-02-08 NOTE — Telephone Encounter (Signed)
 Crystal with [facility] called to confirm they received fax to start IV abx's and will get patient started.   Klayton Monie Lesli Albee, CMA

## 2024-02-08 NOTE — Telephone Encounter (Signed)
 Spoke to [employer] at [facility] - informed her of new OPAT orders. Patient receives dialysis on Tuesdays, Thursdays and Saturdays.  OPAT orders faxed to [facility].   Fax [phone]

## 2024-02-11 ENCOUNTER — Encounter (HOSPITAL_BASED_OUTPATIENT_CLINIC_OR_DEPARTMENT_OTHER): Admitting: Internal Medicine

## 2024-02-11 ENCOUNTER — Encounter (HOSPITAL_BASED_OUTPATIENT_CLINIC_OR_DEPARTMENT_OTHER): Attending: Internal Medicine | Admitting: Internal Medicine

## 2024-02-11 DIAGNOSIS — E11622 Type 2 diabetes mellitus with other skin ulcer: Secondary | ICD-10-CM | POA: Insufficient documentation

## 2024-02-11 DIAGNOSIS — N186 End stage renal disease: Secondary | ICD-10-CM | POA: Diagnosis not present

## 2024-02-11 DIAGNOSIS — L89154 Pressure ulcer of sacral region, stage 4: Secondary | ICD-10-CM | POA: Insufficient documentation

## 2024-02-11 DIAGNOSIS — S81801A Unspecified open wound, right lower leg, initial encounter: Secondary | ICD-10-CM | POA: Diagnosis not present

## 2024-02-11 DIAGNOSIS — Z89512 Acquired absence of left leg below knee: Secondary | ICD-10-CM | POA: Diagnosis not present

## 2024-02-11 DIAGNOSIS — E1122 Type 2 diabetes mellitus with diabetic chronic kidney disease: Secondary | ICD-10-CM | POA: Diagnosis not present

## 2024-02-11 DIAGNOSIS — L89892 Pressure ulcer of other site, stage 2: Secondary | ICD-10-CM | POA: Diagnosis not present

## 2024-02-11 DIAGNOSIS — S80811A Abrasion, right lower leg, initial encounter: Secondary | ICD-10-CM | POA: Insufficient documentation

## 2024-02-11 DIAGNOSIS — L89612 Pressure ulcer of right heel, stage 2: Secondary | ICD-10-CM | POA: Insufficient documentation

## 2024-02-11 DIAGNOSIS — E11621 Type 2 diabetes mellitus with foot ulcer: Secondary | ICD-10-CM | POA: Insufficient documentation

## 2024-02-11 DIAGNOSIS — L89610 Pressure ulcer of right heel, unstageable: Secondary | ICD-10-CM | POA: Insufficient documentation

## 2024-02-11 DIAGNOSIS — L89324 Pressure ulcer of left buttock, stage 4: Secondary | ICD-10-CM | POA: Diagnosis not present

## 2024-02-18 ENCOUNTER — Other Ambulatory Visit: Payer: Self-pay | Admitting: Student

## 2024-02-18 DIAGNOSIS — Z794 Long term (current) use of insulin: Secondary | ICD-10-CM

## 2024-02-21 ENCOUNTER — Other Ambulatory Visit: Payer: Self-pay

## 2024-02-21 ENCOUNTER — Ambulatory Visit (HOSPITAL_COMMUNITY)
Admission: RE | Admit: 2024-02-21 | Discharge: 2024-02-21 | Disposition: A | Discharge status: Home / Self Care | Source: Ambulatory Visit | Attending: Diagnostic Radiology | Admitting: Diagnostic Radiology

## 2024-02-21 DIAGNOSIS — R339 Retention of urine, unspecified: Secondary | ICD-10-CM | POA: Insufficient documentation

## 2024-02-21 DIAGNOSIS — E1159 Type 2 diabetes mellitus with other circulatory complications: Secondary | ICD-10-CM

## 2024-02-21 DIAGNOSIS — Z435 Encounter for attention to cystostomy: Secondary | ICD-10-CM | POA: Insufficient documentation

## 2024-02-21 HISTORY — PX: IR CYSTOSTOMY TUBE CHANGE COMPLICATED: IMG1098

## 2024-02-21 HISTORY — PX: IR CATHETER TUBE CHANGE: IMG717

## 2024-02-21 LAB — GLUCOSE, CAPILLARY: Glucose-Capillary: 122 mg/dL — ABNORMAL HIGH (ref 70–99)

## 2024-02-21 MED ORDER — IOHEXOL 300 MG/ML  SOLN
50.0000 mL | Freq: Once | INTRAMUSCULAR | Status: AC | PRN
Start: 2024-02-21 — End: 2024-02-21
  Administered 2024-02-21: 10 mL

## 2024-02-21 MED ORDER — FENTANYL CITRATE (PF) 100 MCG/2ML IJ SOLN
INTRAMUSCULAR | Status: AC
  Filled 2024-02-21: qty 2

## 2024-02-21 MED ORDER — LIDOCAINE VISCOUS HCL 2 % MT SOLN
OROMUCOSAL | Status: AC
  Filled 2024-02-21: qty 15

## 2024-02-21 MED ORDER — MIDAZOLAM HCL 2 MG/2ML IJ SOLN
INTRAMUSCULAR | Status: AC | PRN
  Administered 2024-02-21: 1 mg via INTRAVENOUS

## 2024-02-21 MED ORDER — SODIUM CHLORIDE 0.9 % IV SOLN: INTRAVENOUS | Status: DC

## 2024-02-21 MED ORDER — ACETAMINOPHEN 325 MG PO TABS
650.0000 mg | ORAL_TABLET | Freq: Four times a day (QID) | ORAL | Status: DC | PRN
  Administered 2024-02-21: 650 mg via ORAL
  Filled 2024-02-21 (×4): qty 2

## 2024-02-21 MED ORDER — FENTANYL CITRATE (PF) 100 MCG/2ML IJ SOLN
INTRAMUSCULAR | Status: AC | PRN
  Administered 2024-02-21: 50 ug via INTRAVENOUS

## 2024-02-21 MED ORDER — MIDAZOLAM HCL 2 MG/2ML IJ SOLN
INTRAMUSCULAR | Status: AC
Start: 2024-02-21 — End: ?
  Filled 2024-02-21: qty 2

## 2024-02-21 NOTE — Procedures (Signed)
 Interventional Radiology Procedure Note  Procedure: FLUORO SP TUBE EXCHG    Complications: None  Estimated Blood Loss:  0  Findings: 16 FR COUNCIL TIP SP TUBE     Dayne Even, MD

## 2024-02-21 NOTE — H&P (Signed)
 Chief Complaint: Patient was seen in consultation today for urinary retention  Procedure: Suprapubic catheter exchange  Referring Physician(s): Dr. Kasandra Knudsen  Supervising Physician: Ruel Favors  Patient Status: [facility] - Out-pt  History of Present Illness: Bruce Little is a 53 y.o. male with a history of ESRD on HD and urinary retention s/p suprapubic catheter placement on 01/03/24. Patient was seen 2 days later for follow up due to hematuria for which he ended up being admitted for a blood transfusion. Patient was on 81mg  ASA daily for Afib; however, he no longer takes this due to persistent hematuria. Patient currently with 16Fr suprapubic catheter in place. He presents today for routine exchange.  Patient is resting comfortably in bed with the nurse at the bedside. States that he has not been having any concerns with hematuria since stopping his ASA. Denies any abdominal pain, pelvic pain, or concerns with the catheter. NPO since midnight. Afebrile. VSS.   Code Status: Full Code  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 05/03/2023   CKD (chronic kidney disease) stage 5, GFR less than 15 ml/min (HCC) 07/02/2021   COVID    has had it 2 times, one mild and one wasn't   Depression    DM2 (diabetes mellitus, type 2) (HCC)    Family history of adverse reaction to anesthesia    Dad has a "hard time waking up" after anesthesia   GERD (gastroesophageal reflux disease)    Hemorrhagic stroke (HCC) 04/30/2023   s/p right decompressive craniectomy and evacuation of hematoma on 04/30/2023   History of blood transfusion    HTN (hypertension)    ICH (intracerebral hemorrhage) (HCC) 09/17/2023   Osteomyelitis of fifth toe of left foot (HCC) 08/13/2021   Osteomyelitis of  fourth toe of left foot (HCC) 08/13/2021   Pneumonia    Sacral decubitus ulcer 05/2023   Stroke (HCC) 05/19/2021   unable to move left side    Past Surgical History:  Procedure Laterality Date   AMPUTATION Left 08/29/2021   Procedure: AMPUTATION OF FOURTH TOE AND RAY ALONG WITH REMAINING FITH METATARSAL;  Surgeon: Tarry Kos, MD;  Location: [facility] OR;  Service: Orthopedics;  Laterality: Left;   AMPUTATION Left 09/03/2021   Procedure: LISFRANC AMPUTATION;  Surgeon: Tarry Kos, MD;  Location: [facility] OR;  Service: Orthopedics;  Laterality: Left;   AMPUTATION Left 10/29/2021   Procedure: AMPUTATION BELOW KNEE -LEFT;  Surgeon: Tarry Kos, MD;  Location: [facility] OR;  Service: Orthopedics;  Laterality: Left;   APPLICATION OF WOUND VAC Left 07/07/2021   Procedure: APPLICATION OF WOUND VAC;  Surgeon: Tarry Kos, MD;  Location: [facility] OR;  Service: Orthopedics;  Laterality: Left;   APPLICATION OF WOUND VAC Left 08/29/2021   Procedure: APPLICATION OF WOUND VAC;  Surgeon: Tarry Kos, MD;  Location: [facility] OR;  Service: Orthopedics;  Laterality: Left;   AV FISTULA PLACEMENT Left 01/18/2024   Procedure: LEFT ARM ARTERIOVENOUS (AV) FISTULA CREATION;  Surgeon: Maeola Harman, MD;  Location: [facility] OR;  Service: Vascular;  Laterality: Left;   DIALYSIS/PERMA CATHETER INSERTION N/A 12/28/2023   Procedure: DIALYSIS/PERMA CATHETER INSERTION;  Surgeon: Dagoberto Ligas, MD;  Location: [facility] INVASIVE CV LAB;  Service: Cardiovascular;  Laterality: N/A;  DIALYSIS/PERMA CATHETER REMOVAL N/A 12/28/2023   Procedure: DIALYSIS/PERMA CATHETER REMOVAL;  Surgeon: Dagoberto Ligas, MD;  Location: [facility] INVASIVE CV LAB;  Service: Cardiovascular;  Laterality: N/A;   I & D EXTREMITY Left 07/03/2021   Procedure: IRRIGATION AND DEBRIDEMENT ,FIFTH RAY  AMPUTATION LEFT FOOT, , WOUND VAC PLACEMENT;  Surgeon: Tarry Kos, MD;  Location: [facility] OR;  Service: Orthopedics;  Laterality: Left;   I & D EXTREMITY Left 07/07/2021   Procedure: IRRIGATION  AND DEBRIDEMENT LEFT FOOT;  Surgeon: Tarry Kos, MD;  Location: [facility] OR;  Service: Orthopedics;  Laterality: Left;   I & D EXTREMITY Left 08/29/2021   Procedure: IRRIGATION AND DEBRIDEMENT LEFT FOOT;  Surgeon: Tarry Kos, MD;  Location: [facility] OR;  Service: Orthopedics;  Laterality: Left;   IR FLUORO GUIDE CV LINE RIGHT  07/09/2021   IR RADIOLOGIST EVAL & MGMT  01/05/2024   suprapubic cath- pt's wife states the bleeding from that has resolved   IR REMOVAL TUN CV CATH W/O FL  10/08/2021   IR REMOVAL TUN CV CATH W/O FL  07/13/2023   IR REPLACE G-TUBE SIMPLE WO FLUORO  09/20/2023   IR US GUIDE VASC ACCESS RIGHT  07/09/2021   TRACHEOSTOMY     05/14/2023 - 07/23/2023   VITRECTOMY Left    [place] [facility]    Allergies: Patient has no known allergies.  Medications: Prior to Admission medications   Medication Sig Start Date End Date Taking? Authorizing Provider  acetaminophen (TYLENOL) 500 MG tablet Take 1,000 mg by mouth every 6 (six) hours as needed for mild pain (pain score 1-3) or headache.    [provider]  amiodarone (PACERONE) 100 MG tablet Take 100 mg by mouth daily.    [provider]  amLODipine (NORVASC) 10 MG tablet Take 10 mg by mouth daily.    [provider]  ascorbic acid (VITAMIN C) 500 MG tablet Take 1 tablet (500 mg total) by mouth 2 (two) times daily. 10/02/23   [name], MD  ASHWAGANDHA PO Take 1 Dose by mouth at bedtime. 1 dose- 2 gummies    [provider]  aspirin EC 81 MG tablet Take 81 mg by mouth at bedtime. Swallow whole. Patient not taking: Reported on 02/07/2024    [provider]  atorvastatin (LIPITOR) 40 MG tablet Take 40 mg by mouth at bedtime.    [provider]  Calcium Carb-Cholecalciferol (CALCIUM 500 + D3 PO) Take 1 tablet by mouth daily.    [provider]  carvedilol (COREG) 25 MG tablet Take 1 tablet (25 mg total) by mouth 2 (two) times daily with a meal. 10/02/23   [name], MD  cloNIDine (CATAPRES) 0.1 MG tablet Take 1 tablet (0.1 mg total) by mouth daily. 10/02/23   [name], MD  diphenhydramine-acetaminophen (TYLENOL PM) 25-500 MG TABS tablet Take 2 tablets by mouth at bedtime as needed.    [provider]  finasteride (PROSCAR) 5 MG tablet Take 5 mg by mouth daily.    [provider]  furosemide (LASIX) 40 MG tablet Take 40 mg by mouth daily.    [provider]  glipiZIDE (GLUCOTROL) 5 MG tablet Take 1 tablet (5 mg total) by mouth daily. 05/21/21 05/08/24  Marguerita Merles Latif, DO  hydrALAZINE (APRESOLINE) 25 MG tablet Take 25 mg by mouth 3 (three) times daily.    [provider]  LANTUS SOLOSTAR 100 UNIT/ML Solostar Pen Inject 5 Units into the skin daily.  Patient taking differently: Inject 5 Units into the skin at bedtime. 10/02/23   [name], MD  levETIRAcetam (KEPPRA) 750 MG tablet Take 1,500 mg by mouth 2 (two) times daily.    [provider]  losartan (COZAAR) 50 MG tablet Take 1 tablet (50 mg total) by mouth daily. 10/02/23   [name], MD  melatonin 5 MG TABS Take 5-10 mg by mouth at bedtime as needed (sleep).    [provider]  methocarbamol (ROBAXIN) 500 MG tablet Take 1 tablet (500 mg total) by mouth every 8 (eight) hours as needed for muscle spasms. Patient taking differently: Take 500 mg by mouth 2 (two) times daily. May take a third 500 mg dose midday as needed for muscle spasms 10/02/23   [name], MD  multivitamin (RENA-VIT) TABS tablet Take 1 tablet by mouth at bedtime. 10/02/23   [name], MD  mupirocin ointment (BACTROBAN) 2 % Apply 1 Application topically 2 (two) times daily. 12/24/23   [provider]  NATURAL VITAMIN A PO Take 3,000 mcg by mouth daily.    [provider]  ondansetron (ZOFRAN-ODT) 4 MG disintegrating tablet Take 4 mg by mouth every 8 (eight) hours as needed for nausea or vomiting.    [provider]  oxyCODONE-acetaminophen (PERCOCET/ROXICET) 5-325 MG tablet Take 1 tablet by mouth every 6 (six) hours as needed. 01/18/24   Lars Mage, PA-C  pantoprazole (PROTONIX) 40 MG tablet Take 1 tablet (40 mg total) by mouth daily. 05/22/21   Marguerita Merles Latif, DO  PARoxetine (PAXIL) 10 MG tablet Take 10 mg by mouth daily.    [provider]  polyethylene glycol (MIRALAX / GLYCOLAX) 17 g packet Take 17 g by mouth 2 (two) times daily. Patient taking differently: Take 17 g by mouth daily as needed for moderate constipation. 11/03/23   Osvaldo Shipper, MD  promethazine (PHENERGAN) 12.5 MG tablet Take 1 tablet (12.5 mg total) by mouth every 6 (six) hours as needed for nausea or vomiting. 01/08/24   Carollee Herter, DO  promethazine (PHENERGAN) 25 MG suppository Place 1 suppository (25 mg total) rectally every 6 (six) hours as needed for nausea or vomiting. 01/08/24   Carollee Herter, DO  senna-docusate (SENOKOT-S) 8.6-50 MG tablet Take 2 tablets by mouth 2 (two) times daily. Patient taking differently: Take 2 tablets by mouth 2 (two) times daily as needed for mild constipation. 11/03/23   Osvaldo Shipper, MD  terazosin (HYTRIN) 1 MG capsule Take 1 mg by mouth at bedtime.    [provider]  traZODone (DESYREL) 100 MG tablet Take 1 tablet (100 mg total) by mouth at bedtime as needed for sleep. 01/08/24   Carollee Herter, DO  vancomycin (VANCOCIN) 125 MG capsule Take 1 capsule (125 mg total) by mouth 2 (two) times daily. 02/07/24   ComerBelia Heman, MD  Vitamin D, Ergocalciferol, (DRISDOL) 1.25 MG (50000 UNIT) CAPS capsule Take 1 capsule (50,000 Units total) by mouth every 7 (seven) days. 10/03/23   [name], MD     Family History  Problem Relation Age of Onset   Stroke Mother    Cancer Mother    Heart disease Father     Social History   Socioeconomic History   Marital status: Married    Spouse name: [name]   Number of children: Not on file   Years of education: Not on file    Highest education level: Not on file  Occupational History   Not  on file  Tobacco Use   Smoking status: Never   Smokeless tobacco: Never  Vaping Use   Vaping status: Never Used  Substance and Sexual Activity   Alcohol use: Not Currently   Drug use: Never   Sexual activity: Not on file  Other Topics Concern   Not on file  Social History Narrative   Not on file   Social Drivers of Health   Financial Resource Strain: Low Risk  (08/10/2023)   Received from [facility]   Overall Financial Resource Strain (CARDIA)    Difficulty of Paying Living Expenses: Not hard at all  Food Insecurity: No Food Insecurity (01/05/2024)   Hunger Vital Sign    Worried About Running Out of Food in the Last Year: Never true    Ran Out of Food in the Last Year: Never true  Transportation Needs: No Transportation Needs (01/05/2024)   PRAPARE - Administrator, Civil Service (Medical): No    Lack of Transportation (Non-Medical): No  Physical Activity: Not on file  Stress: No Stress Concern Present (08/10/2023)   Received from [facility]   [facility] of Occupational Health - Occupational Stress Questionnaire    Feeling of Stress : Not at all  Social Connections: Unknown (08/13/2023)   Received from [facility]   Social Network    Social Network: Not on file    Review of Systems Denies any N/V, chest pain, shortness of breath, fevers/chills hematuria. All other ROS negative.  Vital Signs: BP 113/68   Pulse 76   Temp 97.7 F (36.5 C)   Resp 17   Ht 6\' 2"  (1.88 m)   Wt 157 lb 6.4 oz (71.4 kg)   SpO2 99%   BMI 20.21 kg/m   Physical Exam Vitals reviewed.  Constitutional:      Appearance: Normal appearance.  HENT:     Head: Normocephalic and atraumatic.     Mouth/Throat:     Mouth: Mucous membranes are moist.     Pharynx: Oropharynx is clear.  Cardiovascular:     Rate and Rhythm: Normal rate and regular rhythm.  Pulmonary:     Effort: Pulmonary effort is  normal.     Breath sounds: Normal breath sounds.  Abdominal:     General: Abdomen is flat.     Palpations: Abdomen is soft.  Musculoskeletal:        General: Normal range of motion.     Cervical back: Normal range of motion.  Skin:    General: Skin is warm and dry.  Neurological:     General: No focal deficit present.     Mental Status: He is alert and oriented to person, place, and time. Mental status is at baseline.  Psychiatric:        Mood and Affect: Mood normal.        Behavior: Behavior normal.        Judgment: Judgment normal.    Imaging: VAS [place] ABI WITH/WO TBI Result Date: 01/27/2024  LOWER EXTREMITY DOPPLER STUDY Patient Name:  Mehul Rudin Krasinski  Date of Exam:   01/26/2024 Medical Rec #: [id number]     Accession #:    [id number] Date of Birth: 12-16-1970     Patient Gender: M Patient Age:   61 years Exam Location:  [facility] Procedure:      VAS [place] ABI WITH/WO TBI Referring Phys: JESSICA HOFFMAN --------------------------------------------------------------------------------  Indications: Ulceration. Unstageable pressure ulcer of right heel, A-fib. left  BKA High Risk Factors: Hypertension, Diabetes, prior CVA.  Limitations: Today's exam was limited due to bandages and Wheelchair bound. Comparison Study: No prior exam. Performing Technologist: Fernande Bras  Examination Guidelines: A complete evaluation includes at minimum, Doppler waveform signals and systolic blood pressure reading at the level of bilateral brachial, anterior tibial, and posterior tibial arteries, when vessel segments are accessible. Bilateral testing is considered an integral part of a complete examination. Photoelectric Plethysmograph (PPG) waveforms and toe systolic pressure readings are included as required and additional duplex testing as needed. Limited examinations for reoccurring indications may be performed as noted.  ABI Findings: +---------+------------------+-----+--------+--------+  Right    Rt Pressure (mmHg)IndexWaveformComment  +---------+------------------+-----+--------+--------+ Brachial 120                                     +---------+------------------+-----+--------+--------+ PTA      255               2.12                  +---------+------------------+-----+--------+--------+ DP       255               2.12                  +---------+------------------+-----+--------+--------+ Great Toe70                0.58                  +---------+------------------+-----+--------+--------+ +--------+------------------+-----+--------+------------------------+ Left    Lt Pressure (mmHg)IndexWaveformComment                  +--------+------------------+-----+--------+------------------------+ Brachial                               Restricted arm, fistula. +--------+------------------+-----+--------+------------------------+ +-------+-----------+-----------+------------+------------+ ABI/TBIToday's ABIToday's TBIPrevious ABIPrevious TBI +-------+-----------+-----------+------------+------------+ Right             0.58                                +-------+-----------+-----------+------------+------------+ Arterial wall calcification precludes accurate ankle pressures and ABIs.  Summary: Right: Resting right ankle-brachial index indicates noncompressible right lower extremity arteries. The right toe-brachial index is abnormal. *See table(s) above for measurements and observations.  Electronically signed by Coral Else MD on 01/27/2024 at 7:41:52 AM.    Final     Labs:  CBC: Recent Labs    01/05/24 1701 01/06/24 0417 01/07/24 0616 01/08/24 0508 01/18/24 0857  WBC 15.3* 15.0* 14.6* 14.6*  --   HGB 7.8* 6.8* 9.3* 9.2* 10.9*  HCT 25.9* 23.1* 29.8* 30.0* 32.0*  PLT 337 338 286 272  --     COAGS: Recent Labs    09/18/23 0058  INR 1.1  APTT 29    BMP: Recent Labs    01/05/24 1701 01/06/24 0417 01/07/24 0616  01/08/24 0508 01/18/24 0857  NA 132* 130* 135 135 135  K 4.5 4.2 4.0 3.7 3.8  CL 97* 98 98 98 99  CO2 26 22 24 26   --   GLUCOSE 70 91 88 103* 111*  BUN 26* 30* 13 18 27*  CALCIUM 9.2 8.4* 8.5* 8.7*  --   CREATININE 2.07* 2.40* 1.66* 2.44* 1.80*  GFRNONAA 38* 32* 50* 31*  --  LIVER FUNCTION TESTS: Recent Labs    11/01/23 0620 11/02/23 0454 12/31/23 1748 01/05/24 1701 01/07/24 0616 01/08/24 0508  BILITOT 0.7  --  0.5 0.6 0.6  --   AST 27  --  31 25 20   --   ALT 28  --  42 31 26  --   ALKPHOS 120  --  142* 133* 135*  --   PROT 6.7  --  6.4* 6.3* 6.5  --   ALBUMIN 2.0*   < > 1.6* 1.6* 1.6* 1.7*   < > = values in this interval not displayed.    TUMOR MARKERS: No results for input(s): "AFPTM", "CEA", "CA199", "CHROMGRNA" in the last 8760 hours.  Assessment and Plan:  Urinary Retention: Philopater Mucha Muniz is a 53 y.o. male with a history of urinary retention s/p placement of 16Fr SPT on 2/24 with Dr. Irine Manning who presents to [facility] department for an image-guided SPT exchange with Dr. Sylvester Evert. Procedure to be performed under moderate sedation.  Risks and benefits of suprapubic catheter placement were discussed with the patient including bleeding, infection, damage to adjacent structures, bladder perforation/fistula connection, and sepsis.  All of the patient's questions were answered, patient is agreeable to proceed. Consent signed and in chart.  Thank you for this interesting consult. I greatly enjoyed meeting [facility] [employer] [facility] and look forward to participating in their care. A copy of this report was sent to the requesting provider on this date.  Electronically Signed: Elise Gladden M Ranay Ketter, PA-C 02/21/2024, 1:34 PM   I spent a total of 10 Minutes in face to face clinical consultation, greater than 50% of which was counseling/coordinating care for suprapubic catheter exchange.

## 2024-02-22 ENCOUNTER — Other Ambulatory Visit (HOSPITAL_COMMUNITY): Payer: Self-pay | Admitting: Diagnostic Radiology

## 2024-02-22 ENCOUNTER — Encounter (HOSPITAL_COMMUNITY): Payer: Self-pay

## 2024-02-22 DIAGNOSIS — R339 Retention of urine, unspecified: Secondary | ICD-10-CM

## 2024-02-24 ENCOUNTER — Other Ambulatory Visit: Payer: Self-pay | Admitting: *Deleted

## 2024-02-24 DIAGNOSIS — N186 End stage renal disease: Secondary | ICD-10-CM

## 2024-02-25 ENCOUNTER — Encounter (HOSPITAL_BASED_OUTPATIENT_CLINIC_OR_DEPARTMENT_OTHER): Admitting: Internal Medicine

## 2024-02-25 DIAGNOSIS — L89154 Pressure ulcer of sacral region, stage 4: Secondary | ICD-10-CM | POA: Diagnosis not present

## 2024-03-03 ENCOUNTER — Ambulatory Visit (HOSPITAL_BASED_OUTPATIENT_CLINIC_OR_DEPARTMENT_OTHER): Admitting: Internal Medicine

## 2024-03-07 ENCOUNTER — Telehealth: Admitting: Internal Medicine

## 2024-03-08 ENCOUNTER — Ambulatory Visit (HOSPITAL_COMMUNITY)
Admission: RE | Admit: 2024-03-08 | Discharge: 2024-03-08 | Disposition: A | Discharge status: Home / Self Care | Source: Ambulatory Visit | Attending: Vascular Surgery | Admitting: Vascular Surgery

## 2024-03-08 ENCOUNTER — Ambulatory Visit: Attending: Vascular Surgery | Admitting: Physician Assistant

## 2024-03-08 ENCOUNTER — Other Ambulatory Visit: Payer: Self-pay

## 2024-03-08 ENCOUNTER — Telehealth (INDEPENDENT_AMBULATORY_CARE_PROVIDER_SITE_OTHER): Admitting: Internal Medicine

## 2024-03-08 ENCOUNTER — Telehealth: Payer: Self-pay

## 2024-03-08 ENCOUNTER — Telehealth: Admitting: Internal Medicine

## 2024-03-08 VITALS — BP 121/80 | HR 85 | Temp 98.0°F

## 2024-03-08 DIAGNOSIS — N186 End stage renal disease: Secondary | ICD-10-CM | POA: Diagnosis present

## 2024-03-08 DIAGNOSIS — M4628 Osteomyelitis of vertebra, sacral and sacrococcygeal region: Secondary | ICD-10-CM

## 2024-03-08 NOTE — Progress Notes (Signed)
 Virtual Visit via Telephone/Video Note   I connected with Bruce Little   On 03/10/2024 at 7:09 PM  by Video and verified that I am speaking with the correct person using two identifiers.   I discussed the limitations, risks, security and privacy concerns of performing an evaluation and management service by telephone and the availability of in person appointments. I also discussed with the patient that there may be a patient responsible charge related to this service. The patient expressed understanding and agreed to proceed.   Location:   Patient: Home Provider: [facility]   Patient Active Problem List   Diagnosis Date Noted   Suprapubic catheter (HCC) - placed 01-03-2024. 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 baseline 129-134 01/06/2024   Gross hematuria 01/05/2024   ESRD (end stage renal disease) on dialysis (HCC) 01/05/2024   Acute blood loss anemia (ABLA) 12/31/2023   Leukocytosis 09/17/2023   Sacral decubitus ulcer 09/17/2023   Dependence on renal dialysis (HCC) 08/25/2023   History of hemorrhagic stroke with residual hemiparesis (HCC) 04/10/2023   Mixed hyperlipidemia 07/31/2021   Paroxysmal A-fib (HCC) - Cannot be on systemic anticoagulants due to prior hemorrhagic CVA in June 2024. 07/30/2021   Carpal tunnel syndrome on right 07/10/2021   DM2 (diabetes mellitus, type 2) (HCC) 05/20/2021   HTN (hypertension) 05/20/2021    Patient's Medications  New Prescriptions   No medications on file  Previous Medications   ACETAMINOPHEN  (TYLENOL ) 500 MG TABLET    Take 1,000 mg by mouth every 6 (six) hours as needed for mild pain (pain score 1-3) or headache.   AMIODARONE  (PACERONE ) 100 MG TABLET    Take 100 mg by mouth daily.   AMLODIPINE  (NORVASC ) 10 MG TABLET    Take 10 mg by mouth daily.   ASPIRIN  EC 81 MG TABLET    Take 81 mg by mouth at bedtime. Swallow whole.    ATORVASTATIN  (LIPITOR ) 40 MG TABLET    Take 40 mg by mouth at bedtime.   BACLOFEN (LIORESAL) 20 MG TABLET    Take 20 mg by mouth 2 (two) times daily.   CARVEDILOL  (COREG ) 25 MG TABLET    Take 1 tablet (25 mg total) by mouth 2 (two) times daily with a meal.   CLONIDINE  (CATAPRES ) 0.1 MG TABLET    Take 1 tablet (0.1 mg total) by mouth daily.   DIPHENHYDRAMINE -ACETAMINOPHEN  (TYLENOL  PM) 25-500 MG TABS TABLET    Take 2 tablets by mouth at bedtime as needed.   FINASTERIDE  (PROSCAR ) 5 MG TABLET    Take 5 mg by mouth daily.   FUROSEMIDE  (LASIX ) 40 MG TABLET    Take 40 mg by mouth daily.   GLIPIZIDE  (GLUCOTROL ) 5 MG TABLET    Take 1 tablet (5 mg total) by mouth daily.   HYDRALAZINE  (APRESOLINE ) 25 MG TABLET    Take 25 mg by mouth 3 (three) times daily.   LANTUS  SOLOSTAR 100 UNIT/ML SOLOSTAR PEN    Inject 5 Units into the skin daily.   LEVETIRACETAM  (KEPPRA ) 750 MG TABLET    Take 1,500 mg by mouth 2 (two) times daily.   LOSARTAN  (COZAAR ) 50 MG TABLET    Take 1 tablet (50 mg total) by mouth daily.   MELATONIN 5 MG TABS    Take 5-10 mg by mouth at bedtime as needed (sleep).   METHOCARBAMOL  (ROBAXIN ) 500 MG TABLET    Take 1  tablet (500 mg total) by mouth every 8 (eight) hours as needed for muscle spasms.   MULTIVITAMIN (RENA-VIT) TABS TABLET    Take 1 tablet by mouth at bedtime.   MUPIROCIN  OINTMENT (BACTROBAN ) 2 %    Apply 1 Application topically 2 (two) times daily.   ONDANSETRON  (ZOFRAN -ODT) 4 MG DISINTEGRATING TABLET    Take 4 mg by mouth every 8 (eight) hours as needed for nausea or vomiting.   PANTOPRAZOLE  (PROTONIX ) 40 MG TABLET    Take 1 tablet (40 mg total) by mouth daily.   PAROXETINE  (PAXIL ) 10 MG TABLET    Take 10 mg by mouth daily.   POLYETHYLENE GLYCOL (MIRALAX  / GLYCOLAX ) 17 G PACKET    Take 17 g by mouth 2 (two) times daily.   PROMETHAZINE  (PHENERGAN ) 12.5 MG TABLET    Take 1 tablet (12.5 mg total) by mouth every 6 (six) hours as needed for nausea or vomiting.   PROMETHAZINE  (PHENERGAN ) 25  MG SUPPOSITORY    Place 1 suppository (25 mg total) rectally every 6 (six) hours as needed for nausea or vomiting.   SENNA-DOCUSATE (SENOKOT-S) 8.6-50 MG TABLET    Take 2 tablets by mouth 2 (two) times daily.   TERAZOSIN  (HYTRIN ) 1 MG CAPSULE    Take 1 mg by mouth at bedtime.   TRAZODONE  (DESYREL ) 100 MG TABLET    Take 1 tablet (100 mg total) by mouth at bedtime as needed for sleep.   VANCOMYCIN  (VANCOCIN ) 125 MG CAPSULE    Take 1 capsule (125 mg total) by mouth 2 (two) times daily.  Modified Medications   No medications on file  Discontinued Medications   No medications on file    Subjective:  52 YM presents for video follow-up of sacral OM based on bone Cx + corynebacterium and Pseudomonas. He is on vancomycin  and cefepime with HD  x 6 weeks till 04/10/23. Today his POA was at bedside. Pt denies any new complaints. Followed by Dr. Adriane Albe wound care.  Please see HPI from 02/07/24 for further details: "Wise is here for evaluation of osteomyelitis. He has a history of intracranial bleed with a craniectomy done in [place]  in June 2024 and end-stage renal disease on hemodialysis and atrial fibrillation here with positive bone culture at the site of his sacral decubitus ulcer.  He is followed by Dr. Adriane Albe of wound care who has been helping with his multiple pressure ulcers.  He is here with his wife who is his caregiver.  She is helping with offloading, nutrition and reduction of soiling.  Cultures from bone grew out corynebacterium and Pseudomonas." Review of Systems: Review of Systems  All other systems reviewed and are negative.   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 05/03/2023   CKD (chronic kidney disease) stage 5, GFR less than 15 ml/min (HCC) 07/02/2021   COVID    has  had it 2 times, one mild and one wasn't   Depression    DM2 (diabetes mellitus, type 2) (HCC)    Family history of adverse reaction to anesthesia    Dad has a "hard time waking up" after anesthesia   GERD (gastroesophageal reflux disease)    Hemorrhagic stroke (HCC) 04/30/2023   s/p right decompressive craniectomy and evacuation  of hematoma on 04/30/2023   History of blood transfusion    HTN (hypertension)    ICH (intracerebral hemorrhage) (HCC) 09/17/2023   Osteomyelitis of fifth toe of left foot (HCC) 08/13/2021   Osteomyelitis of fourth toe of left foot (HCC) 08/13/2021   Pneumonia    Sacral decubitus ulcer 05/2023   Stroke (HCC) 05/19/2021   unable to move left side    Social History   Tobacco Use   Smoking status: Never   Smokeless tobacco: Never  Vaping Use   Vaping status: Never Used  Substance Use Topics   Alcohol use: Not Currently   Drug use: Never    Family History  Problem Relation Age of Onset   Stroke Mother    Cancer Mother    Heart disease Father     No Known Allergies  Health Maintenance  Topic Date Due   FOOT EXAM  Never done   OPHTHALMOLOGY EXAM  Never done   DTaP/Tdap/Td (1 - Tdap) Never done   Pneumococcal Vaccine 2-27 Years old (1 of 2 - PCV) Never done   Colonoscopy  Never done   Zoster Vaccines- Shingrix (1 of 2) Never done   COVID-19 Vaccine (2 - 2024-25 season) 07/11/2023   HEMOGLOBIN A1C  03/17/2024   INFLUENZA VACCINE  06/09/2024   Hepatitis C Screening  Completed   HIV Screening  Completed   HPV VACCINES  Aged Out   Meningococcal B Vaccine  Aged Out    Lab Results Lab Results  Component Value Date   WBC 14.6 (H) 01/08/2024   HGB 10.9 (L) 01/18/2024   HCT 32.0 (L) 01/18/2024   MCV 89.8 01/08/2024   PLT 272 01/08/2024    Lab Results  Component Value Date   CREATININE 1.80 (H) 01/18/2024   BUN 27 (H) 01/18/2024   NA 135 01/18/2024   K 3.8 01/18/2024   CL 99 01/18/2024   CO2 26 01/08/2024    Lab Results  Component  Value Date   ALT 26 01/07/2024   AST 20 01/07/2024   ALKPHOS 135 (H) 01/07/2024   BILITOT 0.6 01/07/2024    Lab Results  Component Value Date   CHOL 271 (H) 05/21/2021   HDL 31 (L) 05/21/2021   LDLCALC UNABLE TO CALCULATE IF TRIGLYCERIDE OVER 400 mg/dL 16/08/9603   LDLDIRECT 122.9 (H) 05/21/2021   TRIG 456 (H) 05/21/2021   CHOLHDL 8.7 05/21/2021   Lab Results  Component Value Date   LABRPR NON REACTIVE 05/21/2021   No results found for: "HIV1RNAQUANT", "HIV1RNAVL", "CD4TABS"   Problem List Items Addressed This Visit   None  Results   Assessment/Plan #Sacral ulcer with osteomyelitis based on bone culture that has grown corynebacterium and Pseudomonas  -On vancomycin  and cefepime x6 weeks with HD EOT 03/20/24 -Sees wound care Dr. Adriane Albe noted on 4/4 "Patient has been using Santyl to the wound beds except for the right lateral leg he is using antibiotic ointment and Dakin' s wet- to- dry dressings to the sacral and ischial wounds. Overall wounds are smaller. The right lateral leg wound has healed". I reviewed images as well -Pt's POA prefers tele visit.  -Called [facility] for labs: please do weekly esr and crp. Vanc trough 19.3 on 4/24(relayed results following appt to POA).  -Ok to do tele visit on 5/23 (with [facility]) as images available form wound care and we do not have a hoyer lift.      Orlie Bjornstad, MD [facility] 03/08/2024, 12:52 PM   I have personally spent 42 minutes involved in face-to-face and non-face-to-face activities for this patient on the day of the visit.

## 2024-03-08 NOTE — Telephone Encounter (Signed)
 Called [facility] to obtain a copy of patients labs. Spoke with nurse [place] whom I gave fax number to and she was going to send them over.

## 2024-03-08 NOTE — Progress Notes (Signed)
 POST OPERATIVE OFFICE NOTE    CC:  F/u for surgery  HPI: Bruce Little is a 53 y.o. male who is here for postop visit.  He recently underwent creation of a left upper arm brachiocephalic fistula on 01/18/2024 by Dr. Vikki Graves. This was done for permanent dialysis access.  He returns today for follow up.  His wife is present at today's visit and provides most of the history.  The patient denies any left hand pain.  He has some tingling and numbness in the hand, but this is unchanged from his baseline.  He has no motor function in the left upper extremity due to previous stroke.  He denies any issues with his incision such as drainage, redness, or swelling.  He dialyzes on Tuesdays, Thursdays, and Saturdays at [facility].   No Known Allergies  Current Outpatient Medications  Medication Sig Dispense Refill   acetaminophen  (TYLENOL ) 500 MG tablet Take 1,000 mg by mouth every 6 (six) hours as needed for mild pain (pain score 1-3) or headache.     amiodarone  (PACERONE ) 100 MG tablet Take 100 mg by mouth daily.     amLODipine  (NORVASC ) 10 MG tablet Take 10 mg by mouth daily.     aspirin  EC 81 MG tablet Take 81 mg by mouth at bedtime. Swallow whole. (Patient not taking: Reported on 02/07/2024)     atorvastatin  (LIPITOR ) 40 MG tablet Take 40 mg by mouth at bedtime.     carvedilol  (COREG ) 25 MG tablet Take 1 tablet (25 mg total) by mouth 2 (two) times daily with a meal. 60 tablet 0   cloNIDine  (CATAPRES ) 0.1 MG tablet Take 1 tablet (0.1 mg total) by mouth daily. 30 tablet 0   diphenhydramine -acetaminophen  (TYLENOL  PM) 25-500 MG TABS tablet Take 2 tablets by mouth at bedtime as needed.     finasteride  (PROSCAR ) 5 MG tablet Take 5 mg by mouth daily.     furosemide  (LASIX ) 40 MG tablet Take 40 mg by mouth daily.     glipiZIDE  (GLUCOTROL ) 5 MG tablet Take 1 tablet (5 mg total) by mouth daily. 30 tablet 0   hydrALAZINE  (APRESOLINE ) 25 MG tablet Take 25 mg by mouth 3 (three) times daily.     LANTUS  SOLOSTAR  100 UNIT/ML Solostar Pen Inject 5 Units into the skin daily. (Patient taking differently: Inject 5 Units into the skin at bedtime.) 15 mL 0   levETIRAcetam  (KEPPRA ) 750 MG tablet Take 1,500 mg by mouth 2 (two) times daily.     losartan  (COZAAR ) 50 MG tablet Take 1 tablet (50 mg total) by mouth daily. 30 tablet 0   melatonin 5 MG TABS Take 5-10 mg by mouth at bedtime as needed (sleep).     methocarbamol  (ROBAXIN ) 500 MG tablet Take 1 tablet (500 mg total) by mouth every 8 (eight) hours as needed for muscle spasms. (Patient taking differently: Take 500 mg by mouth 2 (two) times daily. May take a third 500 mg dose midday as needed for muscle spasms) 30 tablet 0   multivitamin (RENA-VIT) TABS tablet Take 1 tablet by mouth at bedtime. 30 tablet 0   mupirocin  ointment (BACTROBAN ) 2 % Apply 1 Application topically 2 (two) times daily.     ondansetron  (ZOFRAN -ODT) 4 MG disintegrating tablet Take 4 mg by mouth every 8 (eight) hours as needed for nausea or vomiting.     pantoprazole  (PROTONIX ) 40 MG tablet Take 1 tablet (40 mg total) by mouth daily. 30 tablet 0   PARoxetine  (PAXIL ) 10 MG  tablet Take 10 mg by mouth daily.     polyethylene glycol (MIRALAX  / GLYCOLAX ) 17 g packet Take 17 g by mouth 2 (two) times daily. (Patient taking differently: Take 17 g by mouth daily as needed for moderate constipation.) 60 each 0   promethazine  (PHENERGAN ) 12.5 MG tablet Take 1 tablet (12.5 mg total) by mouth every 6 (six) hours as needed for nausea or vomiting. 60 tablet 0   promethazine  (PHENERGAN ) 25 MG suppository Place 1 suppository (25 mg total) rectally every 6 (six) hours as needed for nausea or vomiting. 12 each 0   senna-docusate (SENOKOT-S) 8.6-50 MG tablet Take 2 tablets by mouth 2 (two) times daily. (Patient taking differently: Take 2 tablets by mouth 2 (two) times daily as needed for mild constipation.) 120 tablet 0   terazosin  (HYTRIN ) 1 MG capsule Take 1 mg by mouth at bedtime.     traZODone  (DESYREL ) 100 MG  tablet Take 1 tablet (100 mg total) by mouth at bedtime as needed for sleep. 60 tablet 0   vancomycin  (VANCOCIN ) 125 MG capsule Take 1 capsule (125 mg total) by mouth 2 (two) times daily. 60 capsule 1   No current facility-administered medications for this visit.     ROS:  See HPI  Physical Exam:  Incision:  left arm incision well healed without swelling, erythema, or drainage Extremities:  palpable left radial pulse. Left brachiocephalic fistula with great thrill throughout the upper arm Neuro: baseline reduced sensation and 0/5 motor in LUE   Studies: Dialysis Duplex (03/08/2024) +------------+----------+-------------+----------+-------------------------  ----+  OUTFLOW VEINPSV (cm/s)Diameter (cm)Depth (cm)          Describe              +------------+----------+-------------+----------+-------------------------  ----+  Confluence    190        0.43        2.11                                   +------------+----------+-------------+----------+-------------------------  ----+  Shoulder      114        0.54        0.87                                   +------------+----------+-------------+----------+-------------------------  ----+  Prox UA        127        0.63        0.80                                   +------------+----------+-------------+----------+-------------------------  ----+  Mid UA         158        0.62        0.42     Branch 0.32 cm / 0.48  cm                                                        depth; 89 mL/min          +------------+----------+-------------+----------+-------------------------  ----+  Dist UA  118        0.75        0.52                                   +------------+----------+-------------+----------+-------------------------  ----+  AC Fossa       118        0.77        0.51                                    +------------+----------+-------------+----------+-------------------------     Assessment/Plan:  This is a 53 y.o. male who is here for post op check  - The patient recently underwent creation of a left brachiocephalic AV fistula for permanent dialysis access - Duplex demonstrates a well matured fistula with flow volumes of 955 mL/min.  The fistula is close to the surface of the skin and has well matured diameters greater than 6 mm throughout the upper arm -He denies any left hand pain.  At baseline he has reduced sensation in the left hand and 0 out of 5 motor function in the left upper extremity due to previous stroke -On exam his left arm incision is well-healed.  He has a palpable left radial pulse.  His left upper arm fistula has a great thrill in it - His fistula should be ready for access by April 11, 2024.  If his fistula functions well for at least 2-3 dialysis sessions, his [facility] can be removed -He can follow-up with our office as needed   Deneise Finlay, PA-C [facility] [phone]  Clinic MD:  Vikki Graves

## 2024-03-09 ENCOUNTER — Encounter (HOSPITAL_COMMUNITY): Payer: Self-pay

## 2024-03-10 ENCOUNTER — Encounter (HOSPITAL_BASED_OUTPATIENT_CLINIC_OR_DEPARTMENT_OTHER): Attending: Internal Medicine | Admitting: Internal Medicine

## 2024-03-10 DIAGNOSIS — E11621 Type 2 diabetes mellitus with foot ulcer: Secondary | ICD-10-CM | POA: Insufficient documentation

## 2024-03-10 DIAGNOSIS — E11622 Type 2 diabetes mellitus with other skin ulcer: Secondary | ICD-10-CM | POA: Insufficient documentation

## 2024-03-10 DIAGNOSIS — L89154 Pressure ulcer of sacral region, stage 4: Secondary | ICD-10-CM | POA: Insufficient documentation

## 2024-03-10 DIAGNOSIS — S81801A Unspecified open wound, right lower leg, initial encounter: Secondary | ICD-10-CM | POA: Insufficient documentation

## 2024-03-10 DIAGNOSIS — N186 End stage renal disease: Secondary | ICD-10-CM | POA: Insufficient documentation

## 2024-03-10 DIAGNOSIS — L89892 Pressure ulcer of other site, stage 2: Secondary | ICD-10-CM | POA: Insufficient documentation

## 2024-03-10 DIAGNOSIS — L89324 Pressure ulcer of left buttock, stage 4: Secondary | ICD-10-CM | POA: Insufficient documentation

## 2024-03-10 DIAGNOSIS — Z89512 Acquired absence of left leg below knee: Secondary | ICD-10-CM | POA: Diagnosis not present

## 2024-03-10 DIAGNOSIS — L89610 Pressure ulcer of right heel, unstageable: Secondary | ICD-10-CM

## 2024-03-10 DIAGNOSIS — S80811A Abrasion, right lower leg, initial encounter: Secondary | ICD-10-CM | POA: Insufficient documentation

## 2024-03-13 ENCOUNTER — Emergency Department (HOSPITAL_COMMUNITY)

## 2024-03-13 ENCOUNTER — Observation Stay (HOSPITAL_COMMUNITY)
Admission: EM | Admit: 2024-03-13 | Discharge: 2024-03-17 | Disposition: A | Discharge status: Home / Self Care | Attending: Internal Medicine | Admitting: Internal Medicine

## 2024-03-13 ENCOUNTER — Encounter (HOSPITAL_COMMUNITY): Payer: Self-pay

## 2024-03-13 DIAGNOSIS — I110 Hypertensive heart disease with heart failure: Secondary | ICD-10-CM | POA: Insufficient documentation

## 2024-03-13 DIAGNOSIS — I69359 Hemiplegia and hemiparesis following cerebral infarction affecting unspecified side: Secondary | ICD-10-CM

## 2024-03-13 DIAGNOSIS — D649 Anemia, unspecified: Secondary | ICD-10-CM | POA: Insufficient documentation

## 2024-03-13 DIAGNOSIS — E871 Hypo-osmolality and hyponatremia: Secondary | ICD-10-CM | POA: Diagnosis not present

## 2024-03-13 DIAGNOSIS — Z7982 Long term (current) use of aspirin: Secondary | ICD-10-CM | POA: Insufficient documentation

## 2024-03-13 DIAGNOSIS — D696 Thrombocytopenia, unspecified: Secondary | ICD-10-CM | POA: Diagnosis not present

## 2024-03-13 DIAGNOSIS — R4182 Altered mental status, unspecified: Secondary | ICD-10-CM | POA: Diagnosis present

## 2024-03-13 DIAGNOSIS — R404 Transient alteration of awareness: Secondary | ICD-10-CM | POA: Diagnosis not present

## 2024-03-13 DIAGNOSIS — Z79899 Other long term (current) drug therapy: Secondary | ICD-10-CM | POA: Insufficient documentation

## 2024-03-13 DIAGNOSIS — Z9359 Other cystostomy status: Secondary | ICD-10-CM

## 2024-03-13 DIAGNOSIS — R569 Unspecified convulsions: Principal | ICD-10-CM | POA: Insufficient documentation

## 2024-03-13 DIAGNOSIS — E1122 Type 2 diabetes mellitus with diabetic chronic kidney disease: Secondary | ICD-10-CM | POA: Insufficient documentation

## 2024-03-13 DIAGNOSIS — M89759 Major osseous defect, unspecified pelvic region and thigh: Secondary | ICD-10-CM

## 2024-03-13 DIAGNOSIS — M869 Osteomyelitis, unspecified: Secondary | ICD-10-CM | POA: Diagnosis not present

## 2024-03-13 DIAGNOSIS — N186 End stage renal disease: Secondary | ICD-10-CM | POA: Insufficient documentation

## 2024-03-13 DIAGNOSIS — L8990 Pressure ulcer of unspecified site, unspecified stage: Secondary | ICD-10-CM | POA: Insufficient documentation

## 2024-03-13 DIAGNOSIS — R339 Retention of urine, unspecified: Secondary | ICD-10-CM | POA: Diagnosis not present

## 2024-03-13 DIAGNOSIS — G9341 Metabolic encephalopathy: Principal | ICD-10-CM | POA: Insufficient documentation

## 2024-03-13 DIAGNOSIS — Z960 Presence of urogenital implants: Secondary | ICD-10-CM | POA: Diagnosis not present

## 2024-03-13 DIAGNOSIS — E119 Type 2 diabetes mellitus without complications: Secondary | ICD-10-CM

## 2024-03-13 DIAGNOSIS — I480 Paroxysmal atrial fibrillation: Secondary | ICD-10-CM | POA: Diagnosis not present

## 2024-03-13 DIAGNOSIS — Z992 Dependence on renal dialysis: Secondary | ICD-10-CM | POA: Insufficient documentation

## 2024-03-13 DIAGNOSIS — Z89512 Acquired absence of left leg below knee: Secondary | ICD-10-CM | POA: Insufficient documentation

## 2024-03-13 DIAGNOSIS — I10 Essential (primary) hypertension: Secondary | ICD-10-CM | POA: Diagnosis present

## 2024-03-13 DIAGNOSIS — N390 Urinary tract infection, site not specified: Secondary | ICD-10-CM | POA: Diagnosis not present

## 2024-03-13 LAB — BASIC METABOLIC PANEL WITH GFR
Anion gap: 12 (ref 5–15)
BUN: 37 mg/dL — ABNORMAL HIGH (ref 6–20)
CO2: 25 mmol/L (ref 22–32)
Calcium: 8.9 mg/dL (ref 8.9–10.3)
Chloride: 99 mmol/L (ref 98–111)
Creatinine, Ser: 3.44 mg/dL — ABNORMAL HIGH (ref 0.61–1.24)
GFR, Estimated: 21 mL/min — ABNORMAL LOW (ref >60)
Glucose, Bld: 138 mg/dL — ABNORMAL HIGH (ref 70–99)
Potassium: 5.3 mmol/L — ABNORMAL HIGH (ref 3.5–5.1)
Sodium: 136 mmol/L (ref 135–145)

## 2024-03-13 LAB — CBC
HCT: 34.2 % — ABNORMAL LOW (ref 39.0–52.0)
Hemoglobin: 10.1 g/dL — ABNORMAL LOW (ref 13.0–17.0)
MCH: 27.6 pg (ref 26.0–34.0)
MCHC: 29.5 g/dL — ABNORMAL LOW (ref 30.0–36.0)
MCV: 93.4 fL (ref 80.0–100.0)
Platelets: 177 10*3/uL (ref 150–400)
RBC: 3.66 MIL/uL — ABNORMAL LOW (ref 4.22–5.81)
RDW: 19.7 % — ABNORMAL HIGH (ref 11.5–15.5)
WBC: 14.1 10*3/uL — ABNORMAL HIGH (ref 4.0–10.5)
nRBC: 0 % (ref 0.0–0.2)

## 2024-03-13 LAB — URINALYSIS, ROUTINE W REFLEX MICROSCOPIC
Bilirubin Urine: NEGATIVE
Glucose, UA: NEGATIVE mg/dL
Ketones, ur: NEGATIVE mg/dL
Nitrite: NEGATIVE
Protein, ur: 100 mg/dL — AB
Specific Gravity, Urine: 1.01 (ref 1.005–1.030)
WBC, UA: >50 WBC/hpf (ref 0–5)
pH: 8 (ref 5.0–8.0)

## 2024-03-13 LAB — HEPATIC FUNCTION PANEL
ALT: 51 U/L — ABNORMAL HIGH (ref 0–44)
AST: 35 U/L (ref 15–41)
Albumin: 2 g/dL — ABNORMAL LOW (ref 3.5–5.0)
Alkaline Phosphatase: 136 U/L — ABNORMAL HIGH (ref 38–126)
Bilirubin, Direct: <0.1 mg/dL (ref 0.0–0.2)
Total Bilirubin: 0.7 mg/dL (ref 0.0–1.2)
Total Protein: 6.7 g/dL (ref 6.5–8.1)

## 2024-03-13 LAB — ETHANOL: Alcohol, Ethyl (B): <15 mg/dL (ref <15)

## 2024-03-13 LAB — AMMONIA: Ammonia: 34 umol/L (ref 9–35)

## 2024-03-13 LAB — HEPATITIS B SURFACE ANTIGEN: Hepatitis B Surface Ag: NONREACTIVE

## 2024-03-13 LAB — GLUCOSE, CAPILLARY: Glucose-Capillary: 138 mg/dL — ABNORMAL HIGH (ref 70–99)

## 2024-03-13 LAB — TROPONIN I (HIGH SENSITIVITY)
Troponin I (High Sensitivity): 13 ng/L (ref <18)
Troponin I (High Sensitivity): 13 ng/L (ref <18)

## 2024-03-13 LAB — CBG MONITORING, ED
Glucose-Capillary: 89 mg/dL (ref 70–99)
Glucose-Capillary: 91 mg/dL (ref 70–99)

## 2024-03-13 MED ORDER — FINASTERIDE 5 MG PO TABS
5.0000 mg | ORAL_TABLET | Freq: Every day | ORAL | Status: DC
  Administered 2024-03-14 – 2024-03-17 (×4): 5 mg via ORAL
  Filled 2024-03-13 (×4): qty 1

## 2024-03-13 MED ORDER — ALBUTEROL SULFATE (2.5 MG/3ML) 0.083% IN NEBU: 3.0000 mL | INHALATION_SOLUTION | Freq: Four times a day (QID) | RESPIRATORY_TRACT | Status: DC | PRN

## 2024-03-13 MED ORDER — TERAZOSIN HCL 1 MG PO CAPS
1.0000 mg | ORAL_CAPSULE | Freq: Every day | ORAL | Status: DC
  Administered 2024-03-14 – 2024-03-16 (×3): 1 mg via ORAL
  Filled 2024-03-13 (×5): qty 1

## 2024-03-13 MED ORDER — LOSARTAN POTASSIUM 50 MG PO TABS
50.0000 mg | ORAL_TABLET | Freq: Every day | ORAL | Status: DC
  Administered 2024-03-13 – 2024-03-17 (×5): 50 mg via ORAL
  Filled 2024-03-13 (×5): qty 1

## 2024-03-13 MED ORDER — ALTEPLASE 2 MG IJ SOLR: 2.0000 mg | Freq: Once | INTRAMUSCULAR | Status: DC | PRN

## 2024-03-13 MED ORDER — PANTOPRAZOLE SODIUM 40 MG PO TBEC
40.0000 mg | DELAYED_RELEASE_TABLET | Freq: Every day | ORAL | Status: DC
  Administered 2024-03-13 – 2024-03-17 (×5): 40 mg via ORAL
  Filled 2024-03-13 (×5): qty 1

## 2024-03-13 MED ORDER — PAROXETINE HCL 10 MG PO TABS
10.0000 mg | ORAL_TABLET | Freq: Every day | ORAL | Status: DC
  Administered 2024-03-14 – 2024-03-17 (×4): 10 mg via ORAL
  Filled 2024-03-13 (×5): qty 1

## 2024-03-13 MED ORDER — FUROSEMIDE 40 MG PO TABS
40.0000 mg | ORAL_TABLET | Freq: Every day | ORAL | Status: DC
  Administered 2024-03-13 – 2024-03-14 (×2): 40 mg via ORAL
  Filled 2024-03-13: qty 1
  Filled 2024-03-13: qty 2

## 2024-03-13 MED ORDER — ANTICOAGULANT SODIUM CITRATE 4% (200MG/5ML) IV SOLN: 5.0000 mL | Status: DC | PRN

## 2024-03-13 MED ORDER — SODIUM CHLORIDE 0.9% FLUSH
3.0000 mL | Freq: Two times a day (BID) | INTRAVENOUS | Status: DC
  Administered 2024-03-14 – 2024-03-15 (×4): 10 mL via INTRAVENOUS
  Administered 2024-03-16 – 2024-03-17 (×2): 3 mL via INTRAVENOUS

## 2024-03-13 MED ORDER — ACETAMINOPHEN 500 MG PO TABS: 1000.0000 mg | ORAL_TABLET | Freq: Four times a day (QID) | ORAL | Status: DC | PRN

## 2024-03-13 MED ORDER — CHLORHEXIDINE GLUCONATE CLOTH 2 % EX PADS
6.0000 | MEDICATED_PAD | Freq: Every day | CUTANEOUS | Status: DC
  Administered 2024-03-14: 6 via TOPICAL

## 2024-03-13 MED ORDER — ACETAMINOPHEN 325 MG PO TABS
650.0000 mg | ORAL_TABLET | Freq: Four times a day (QID) | ORAL | Status: DC | PRN
  Administered 2024-03-15 – 2024-03-16 (×2): 650 mg via ORAL
  Filled 2024-03-13: qty 2

## 2024-03-13 MED ORDER — SODIUM CHLORIDE 0.9 % IV SOLN
2.0000 g | Freq: Once | INTRAVENOUS | Status: AC
  Administered 2024-03-13: 2 g via INTRAVENOUS
  Filled 2024-03-13: qty 12.5

## 2024-03-13 MED ORDER — ONDANSETRON HCL 4 MG/2ML IJ SOLN
4.0000 mg | Freq: Four times a day (QID) | INTRAMUSCULAR | Status: AC
  Administered 2024-03-13 – 2024-03-14 (×2): 4 mg via INTRAVENOUS
  Filled 2024-03-13 (×2): qty 2

## 2024-03-13 MED ORDER — HEPARIN SODIUM (PORCINE) 1000 UNIT/ML IJ SOLN
4000.0000 [IU] | Freq: Once | INTRAMUSCULAR | Status: AC
  Administered 2024-03-13: 4000 [IU]

## 2024-03-13 MED ORDER — ACETAMINOPHEN 650 MG RE SUPP: 650.0000 mg | Freq: Four times a day (QID) | RECTAL | Status: DC | PRN

## 2024-03-13 MED ORDER — LIDOCAINE-PRILOCAINE 2.5-2.5 % EX CREA: 1.0000 | TOPICAL_CREAM | CUTANEOUS | Status: DC | PRN

## 2024-03-13 MED ORDER — ATORVASTATIN CALCIUM 40 MG PO TABS
40.0000 mg | ORAL_TABLET | Freq: Every day | ORAL | Status: DC
  Administered 2024-03-14 – 2024-03-16 (×3): 40 mg via ORAL
  Filled 2024-03-13 (×3): qty 1

## 2024-03-13 MED ORDER — PIPERACILLIN-TAZOBACTAM IN DEX 2-0.25 GM/50ML IV SOLN
2.2500 g | Freq: Three times a day (TID) | INTRAVENOUS | Status: DC
  Administered 2024-03-14 – 2024-03-16 (×7): 2.25 g via INTRAVENOUS
  Filled 2024-03-13 (×9): qty 50

## 2024-03-13 MED ORDER — DEXTROSE-SODIUM CHLORIDE 5-0.45 % IV SOLN: INTRAVENOUS | Status: DC

## 2024-03-13 MED ORDER — AMLODIPINE BESYLATE 10 MG PO TABS
10.0000 mg | ORAL_TABLET | Freq: Every evening | ORAL | Status: DC
  Administered 2024-03-13 – 2024-03-16 (×4): 10 mg via ORAL
  Filled 2024-03-13: qty 1
  Filled 2024-03-13: qty 2
  Filled 2024-03-13 (×2): qty 1

## 2024-03-13 MED ORDER — HYDRALAZINE HCL 20 MG/ML IJ SOLN: 10.0000 mg | Freq: Four times a day (QID) | INTRAMUSCULAR | Status: DC | PRN

## 2024-03-13 MED ORDER — LEVETIRACETAM IN NACL 1500 MG/100ML IV SOLN
1500.0000 mg | Freq: Once | INTRAVENOUS | Status: AC
  Administered 2024-03-13: 1500 mg via INTRAVENOUS
  Filled 2024-03-13: qty 100

## 2024-03-13 MED ORDER — AMIODARONE HCL 200 MG PO TABS
100.0000 mg | ORAL_TABLET | Freq: Every evening | ORAL | Status: DC
  Administered 2024-03-13 – 2024-03-16 (×4): 100 mg via ORAL
  Filled 2024-03-13 (×4): qty 1

## 2024-03-13 MED ORDER — LIDOCAINE HCL (PF) 1 % IJ SOLN: 5.0000 mL | INTRAMUSCULAR | Status: DC | PRN

## 2024-03-13 MED ORDER — HEPARIN SODIUM (PORCINE) 5000 UNIT/ML IJ SOLN: 5000.0000 [IU] | Freq: Three times a day (TID) | INTRAMUSCULAR | Status: DC

## 2024-03-13 MED ORDER — PENTAFLUOROPROP-TETRAFLUOROETH EX AERO: 1.0000 | INHALATION_SPRAY | CUTANEOUS | Status: DC | PRN

## 2024-03-13 MED ORDER — CARVEDILOL 25 MG PO TABS
25.0000 mg | ORAL_TABLET | Freq: Two times a day (BID) | ORAL | Status: DC
  Administered 2024-03-14 – 2024-03-17 (×7): 25 mg via ORAL
  Filled 2024-03-13 (×6): qty 1

## 2024-03-13 MED ORDER — LEVETIRACETAM 500 MG PO TABS
750.0000 mg | ORAL_TABLET | Freq: Two times a day (BID) | ORAL | Status: DC
  Administered 2024-03-14 – 2024-03-17 (×7): 750 mg via ORAL
  Filled 2024-03-13 (×7): qty 1

## 2024-03-13 MED ORDER — PROMETHAZINE HCL 25 MG PO TABS
12.5000 mg | ORAL_TABLET | Freq: Four times a day (QID) | ORAL | Status: DC | PRN
  Administered 2024-03-15: 12.5 mg via ORAL
  Filled 2024-03-13: qty 1

## 2024-03-13 MED ORDER — HEPARIN SODIUM (PORCINE) 1000 UNIT/ML IJ SOLN
INTRAMUSCULAR | Status: AC
  Filled 2024-03-13: qty 4

## 2024-03-13 MED ORDER — HEPARIN SODIUM (PORCINE) 1000 UNIT/ML DIALYSIS: 1000.0000 [IU] | INTRAMUSCULAR | Status: DC | PRN

## 2024-03-13 MED ORDER — SODIUM CHLORIDE 0.9% FLUSH: 3.0000 mL | INTRAVENOUS | Status: DC | PRN

## 2024-03-13 MED ORDER — SODIUM CHLORIDE 0.9% FLUSH
3.0000 mL | Freq: Two times a day (BID) | INTRAVENOUS | Status: DC
  Administered 2024-03-14 – 2024-03-17 (×6): 3 mL via INTRAVENOUS

## 2024-03-13 NOTE — Progress Notes (Signed)
Hd Start

## 2024-03-13 NOTE — ED Notes (Signed)
 Pt's wife called out stating the Pt may be having a seizure.  Pt is staring off and making a repetitive motion with his raised arm.  EDP made aware.

## 2024-03-13 NOTE — Consult Note (Signed)
 NEUROLOGY CONSULT NOTE   Date of service: Mar 13, 2024 Patient Name: Bruce Little MRN:  [id number] DOB:  [birth year] Chief Complaint: "aphasia" Requesting Provider: Mozell Arias, MD  History of Present Illness  Bruce Little is a 53 y.o. male with hx of intracranial bleed with craniectomy June 2024, residual flaccid LUE, ESRD on HD, atrial fibrillation (previously on ASA but stopped taking due to persistent hematuria), left BKA, multiple pressure ulcers,diagnosed with osteomyelitis due to positive bone culture from sacral decubitus ulcer 03/08/24 who presented to [facility] ED 5/5 due to concern for not seeing right and some AMS, slurred speech and some epigastric pain/chest pain.  There was concern patient had possible seizure-like episodes for which neurology was consulted.  I discussed with wife about the episodes of concern.  She mentions that patient has a triangular Ortho lift handle right above his bed and is not unusual for him to try to hold it with his right hand as it brings him comfort.  However, at home today he would lift his right arm up and making repeated this/flapping his right hand for a couple minutes and he did that it a couple times.  He had a episode in the ED and wife recorded a video of the event on her phone.  She was worried that these episodes are probably seizures.  Patient does have a history of seizures and is on Keppra  750 mg twice a day.  Wife gives him Keppra  and is his primary caretaker and patient has not missed any doses of Keppra .  There is no clear postictal period following these episodes.  Wife brought him into the ED for further evaluation and workup.  Wife mentions that over the last couple days, patient has had thick whitish-green urine.  Patient is ESRD and on hemodialysis.  At baseline, patient is bedbound and has decubitus ulcers.  He has had a left BKA.  He is able to answer questions.  He has baseline profound left hemiplegia with contracture in his left arm.  He  is able to feed himself food.  In the ED, patient was loaded with Keppra  1500 mg IV once.  A CT head without contrast was obtained which on my review seems pretty stable compared to his prior CT head and there is no acute intracranial normality.  ROS  Unable to ascertain due to dysarthric speech, slurred and slowed.  Past History   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 05/03/2023   CKD (chronic kidney disease) stage 5, GFR less than 15 ml/min (HCC) 07/02/2021   COVID    has had it 2 times, one mild and one wasn't   Depression    DM2 (diabetes mellitus, type 2) (HCC)    Family history of adverse reaction to anesthesia    Dad has a "hard time waking up" after anesthesia   GERD (gastroesophageal reflux disease)    Hemorrhagic stroke (HCC) 04/30/2023   s/p right decompressive craniectomy and evacuation of hematoma on 04/30/2023   History of blood transfusion    HTN (hypertension)    ICH (intracerebral hemorrhage) (HCC) 09/17/2023   Osteomyelitis of fifth toe of left foot (HCC) 08/13/2021   Osteomyelitis of fourth toe of left foot (HCC) 08/13/2021  Pneumonia    Sacral decubitus ulcer 05/2023   Stroke (HCC) 05/19/2021   unable to move left side    Past Surgical History:  Procedure Laterality Date   AMPUTATION Left 08/29/2021   Procedure: AMPUTATION OF FOURTH TOE AND RAY ALONG WITH REMAINING FITH METATARSAL;  Surgeon: Wes Hamman, MD;  Location: [facility] OR;  Service: Orthopedics;  Laterality: Left;   AMPUTATION Left 09/03/2021   Procedure: LISFRANC AMPUTATION;  Surgeon: Wes Hamman, MD;  Location: [facility] OR;  Service: Orthopedics;  Laterality: Left;   AMPUTATION Left 10/29/2021   Procedure: AMPUTATION BELOW KNEE -LEFT;  Surgeon: Wes Hamman, MD;  Location: [facility] OR;  Service:  Orthopedics;  Laterality: Left;   APPLICATION OF WOUND VAC Left 07/07/2021   Procedure: APPLICATION OF WOUND VAC;  Surgeon: Wes Hamman, MD;  Location: [facility] OR;  Service: Orthopedics;  Laterality: Left;   APPLICATION OF WOUND VAC Left 08/29/2021   Procedure: APPLICATION OF WOUND VAC;  Surgeon: Wes Hamman, MD;  Location: [facility] OR;  Service: Orthopedics;  Laterality: Left;   AV FISTULA PLACEMENT Left 01/18/2024   Procedure: LEFT ARM ARTERIOVENOUS (AV) FISTULA CREATION;  Surgeon: Adine Hoof, MD;  Location: [facility] OR;  Service: Vascular;  Laterality: Left;   DIALYSIS/PERMA CATHETER INSERTION N/A 12/28/2023   Procedure: DIALYSIS/PERMA CATHETER INSERTION;  Surgeon: Melodie Spry, MD;  Location: [facility] INVASIVE CV LAB;  Service: Cardiovascular;  Laterality: N/A;   DIALYSIS/PERMA CATHETER REMOVAL N/A 12/28/2023   Procedure: DIALYSIS/PERMA CATHETER REMOVAL;  Surgeon: Melodie Spry, MD;  Location: [facility] INVASIVE CV LAB;  Service: Cardiovascular;  Laterality: N/A;   I & D EXTREMITY Left 07/03/2021   Procedure: IRRIGATION AND DEBRIDEMENT ,FIFTH RAY  AMPUTATION LEFT FOOT, , WOUND VAC PLACEMENT;  Surgeon: Wes Hamman, MD;  Location: [facility] OR;  Service: Orthopedics;  Laterality: Left;   I & D EXTREMITY Left 07/07/2021   Procedure: IRRIGATION AND DEBRIDEMENT LEFT FOOT;  Surgeon: Wes Hamman, MD;  Location: [facility] OR;  Service: Orthopedics;  Laterality: Left;   I & D EXTREMITY Left 08/29/2021   Procedure: IRRIGATION AND DEBRIDEMENT LEFT FOOT;  Surgeon: Wes Hamman, MD;  Location: [facility] OR;  Service: Orthopedics;  Laterality: Left;   IR CYSTOSTOMY TUBE CHANGE COMPLICATED  02/21/2024   IR FLUORO GUIDE CV LINE RIGHT  07/09/2021   IR RADIOLOGIST EVAL & MGMT  01/05/2024   suprapubic cath- pt's wife states the bleeding from that has resolved   IR REMOVAL TUN CV CATH W/O FL  10/08/2021   IR REMOVAL TUN CV CATH W/O FL  07/13/2023   IR REPLACE G-TUBE SIMPLE WO FLUORO  09/20/2023   IR US  GUIDE VASC ACCESS RIGHT  07/09/2021    TRACHEOSTOMY     05/14/2023 - 07/23/2023   VITRECTOMY Left    [facility]    Family History: Family History  Problem Relation Age of Onset   Stroke Mother    Cancer Mother    Heart disease Father     Social History  reports that he has never smoked. He has never used smokeless tobacco. He reports that he does not currently use alcohol. He reports that he does not use drugs.  No Known Allergies  Medications  No current facility-administered medications for this encounter.  Current Outpatient Medications:    acetaminophen  (TYLENOL ) 500 MG tablet, Take 1,000 mg by mouth every 6 (six) hours as needed for mild pain (pain score 1-3) or headache., Disp: , Rfl:    amiodarone  (PACERONE )  100 MG tablet, Take 100 mg by mouth every evening., Disp: , Rfl:    amLODipine  (NORVASC ) 10 MG tablet, Take 10 mg by mouth every evening., Disp: , Rfl:    atorvastatin  (LIPITOR ) 40 MG tablet, Take 40 mg by mouth at bedtime., Disp: , Rfl:    baclofen (LIORESAL) 20 MG tablet, Take 10 mg by mouth 2 (two) times daily., Disp: , Rfl:    carvedilol  (COREG ) 25 MG tablet, Take 1 tablet (25 mg total) by mouth 2 (two) times daily with a meal., Disp: 60 tablet, Rfl: 0   diphenhydramine -acetaminophen  (TYLENOL  PM) 25-500 MG TABS tablet, Take 2 tablets by mouth at bedtime as needed (For sleep)., Disp: , Rfl:    finasteride  (PROSCAR ) 5 MG tablet, Take 5 mg by mouth daily., Disp: , Rfl:    furosemide  (LASIX ) 40 MG tablet, Take 40 mg by mouth daily., Disp: , Rfl:    LANTUS  SOLOSTAR 100 UNIT/ML Solostar Pen, Inject 5 Units into the skin daily. (Patient taking differently: Inject 5 Units into the skin at bedtime.), Disp: 15 mL, Rfl: 0   levETIRAcetam  (KEPPRA ) 750 MG tablet, Take 1,500 mg by mouth 2 (two) times daily., Disp: , Rfl:    aspirin  EC 81 MG tablet, Take 81 mg by mouth at bedtime. Swallow whole. (Patient not taking: Reported on 03/08/2024), Disp: , Rfl:    cloNIDine  (CATAPRES ) 0.1 MG tablet, Take 1 tablet (0.1 mg  total) by mouth daily. (Patient not taking: Reported on 03/13/2024), Disp: 30 tablet, Rfl: 0   glipiZIDE  (GLUCOTROL ) 5 MG tablet, Take 1 tablet (5 mg total) by mouth daily. (Patient not taking: Reported on 03/08/2024), Disp: 30 tablet, Rfl: 0   hydrALAZINE  (APRESOLINE ) 25 MG tablet, Take 25 mg by mouth 3 (three) times daily. (Patient not taking: Reported on 03/08/2024), Disp: , Rfl:    losartan  (COZAAR ) 50 MG tablet, Take 1 tablet (50 mg total) by mouth daily., Disp: 30 tablet, Rfl: 0   melatonin 5 MG TABS, Take 5-10 mg by mouth at bedtime as needed (sleep)., Disp: , Rfl:    methocarbamol  (ROBAXIN ) 500 MG tablet, Take 1 tablet (500 mg total) by mouth every 8 (eight) hours as needed for muscle spasms. (Patient taking differently: Take 500 mg by mouth 2 (two) times daily. May take a third 500 mg dose midday as needed for muscle spasms), Disp: 30 tablet, Rfl: 0   multivitamin (RENA-VIT) TABS tablet, Take 1 tablet by mouth at bedtime., Disp: 30 tablet, Rfl: 0   mupirocin  ointment (BACTROBAN ) 2 %, Apply 1 Application topically 2 (two) times daily., Disp: , Rfl:    ondansetron  (ZOFRAN -ODT) 4 MG disintegrating tablet, Take 4 mg by mouth every 8 (eight) hours as needed for nausea or vomiting., Disp: , Rfl:    pantoprazole  (PROTONIX ) 40 MG tablet, Take 1 tablet (40 mg total) by mouth daily., Disp: 30 tablet, Rfl: 0   PARoxetine  (PAXIL ) 10 MG tablet, Take 10 mg by mouth daily., Disp: , Rfl:    polyethylene glycol (MIRALAX  / GLYCOLAX ) 17 g packet, Take 17 g by mouth 2 (two) times daily. (Patient taking differently: Take 17 g by mouth daily as needed for moderate constipation.), Disp: 60 each, Rfl: 0   promethazine  (PHENERGAN ) 12.5 MG tablet, Take 1 tablet (12.5 mg total) by mouth every 6 (six) hours as needed for nausea or vomiting., Disp: 60 tablet, Rfl: 0   promethazine  (PHENERGAN ) 25 MG suppository, Place 1 suppository (25 mg total) rectally every 6 (six) hours as needed for nausea  or vomiting., Disp: 12 each, Rfl:  0   senna-docusate (SENOKOT-S) 8.6-50 MG tablet, Take 2 tablets by mouth 2 (two) times daily. (Patient taking differently: Take 2 tablets by mouth 2 (two) times daily as needed for mild constipation.), Disp: 120 tablet, Rfl: 0   terazosin  (HYTRIN ) 1 MG capsule, Take 1 mg by mouth at bedtime., Disp: , Rfl:    traZODone  (DESYREL ) 100 MG tablet, Take 1 tablet (100 mg total) by mouth at bedtime as needed for sleep., Disp: 60 tablet, Rfl: 0   vancomycin  (VANCOCIN ) 125 MG capsule, Take 1 capsule (125 mg total) by mouth 2 (two) times daily., Disp: 60 capsule, Rfl: 1  Vitals   Vitals:   03/13/24 1530 03/13/24 1600 03/13/24 1630 03/13/24 1700  BP: (!) 159/90 (!) 162/75 (!) 160/91 (!) 158/98  Pulse: 82 86 85 84  Resp: 14 15 15 10   Temp:    98.9 F (37.2 C)  TempSrc:    Oral  SpO2: 99% 100% 100% 100%  Weight:      Height:        Body mass index is 20.16 kg/m.  Physical Exam   General: Laying comfortably in bed; in no acute distress.  HENT: Normal oropharynx and mucosa. Normal external appearance of ears and nose.  Neck: Supple, no pain or tenderness  CV: No JVD. No peripheral edema.  Pulmonary: Symmetric Chest rise. Normal respiratory effort.  Abdomen: Soft to touch, non-tender.  Ext: No cyanosis, edema, or deformity  Skin: No rash. Normal palpation of skin.   Musculoskeletal: Normal digits and nails by inspection. No clubbing.   Neurologic Examination  Mental status/Cognition: He opens eyes to voice, oriented to self, place, but not to month and year, poor attention.  He is able to do simple calculations. Speech/language: Nonfluent, dysarthric comprehension intact, names most but not all objects Cranial nerves:   CN II Pupils equal and reactive to light, no VF deficits    CN III,IV,VI EOM intact, no gaze preference or deviation, no nystagmus    CN V normal sensation in V1, V2, and V3 segments bilaterally    CN VII Left facial droop   CN VIII normal hearing to speech    CN IX & X  normal palatal elevation, no uvular deviation    CN XI Able to shrug shoulder on the right only.   CN XII midline tongue protrusion    Motor:  Muscle bulk: Poor, tone normal in right upper extremity, contractures in left upper extremity.  Mvmt Root Nerve  Muscle Right Left Comments  SA C5/6 Ax Deltoid     EF C5/6 Mc Biceps 4+ 0   EE C6/7/8 Rad Triceps 4+ 0   WF C6/7 Med FCR     WE C7/8 PIN ECU     F Ab C8/T1 U ADM/FDI 4+ 0   HF L1/2/3 Fem Illopsoas 3 2   KE L2/3/4 Fem Quad 3  Left BKA  DF L4/5 D Peron Tib Ant 4    PF S1/2 Tibial Grc/Sol 4     Sensation:  Light touch No response to pinch in the left upper extremity and left lower extremity.   Pin prick    Temperature    Vibration   Proprioception    Coordination/Complex Motor:  - Finger to Nose intact in right upper extremity only. - Heel to shin unable to do due to left BKA and bedbound chronically. - Rapid alternating movement are slowed in right upper extremity.  Unable to do  with left upper extremity. - Gait: Deferred for patient's safety as he is bedbound at baseline.  Labs/Imaging/Neurodiagnostic studies   CBC:  Recent Labs  Lab Apr 04, 2024 1323  WBC 14.1*  HGB 10.1*  HCT 34.2*  MCV 93.4  PLT 177   Basic Metabolic Panel:  Lab Results  Component Value Date   NA 136 2024-04-04   K 5.3 (H) 2024/04/04   CO2 25 04-04-2024   GLUCOSE 138 (H) 04-04-2024   BUN 37 (H) 04-Apr-2024   CREATININE 3.44 (H) 04/04/24   CALCIUM  8.9 Apr 04, 2024   GFRNONAA 21 (L) 2024/04/04   Lipid Panel:  Lab Results  Component Value Date   LDLCALC UNABLE TO CALCULATE IF TRIGLYCERIDE OVER 400 mg/dL 56/21/3086   VHQI6N:  Lab Results  Component Value Date   HGBA1C 5.1 09/18/2023   Urine Drug Screen:     Component Value Date/Time   LABOPIA NONE DETECTED 05/19/2021 1950   COCAINSCRNUR NONE DETECTED 05/19/2021 1950   LABBENZ NONE DETECTED 05/19/2021 1950   AMPHETMU NONE DETECTED 05/19/2021 1950   THCU NONE DETECTED 05/19/2021 1950    LABBARB NONE DETECTED 05/19/2021 1950    Alcohol Level     Component Value Date/Time   ETH <15 04-Apr-2024 1323   INR  Lab Results  Component Value Date   INR 1.1 09/18/2023   APTT  Lab Results  Component Value Date   APTT 29 09/18/2023   AED levels: No results found for: "PHENYTOIN", "ZONISAMIDE", "LAMOTRIGINE", "LEVETIRACETA"  CT Head without contrast(Personally reviewed): No evidence of acute intracranial abnormality Extensive chronic encephalomalacia right cerebral hemisphere.  Right cerebral hemisphere bulges beyond right hemicraniectomy defect with 5 mm rightward midline shift, similar to prior head CT of 01/12/2024 Background cerebral white matter chronic small vessel ischemic disease.   ASSESSMENT   ELLIS COWELL is a 53 y.o. male with hx of intracranial bleed with craniectomy June 2024, residual flaccid LUE, ESRD on HD, atrial fibrillation (previously on ASA but stopped taking due to persistent hematuria), left BKA, multiple pressure ulcers,diagnosed with osteomyelitis due to positive bone culture from sacral decubitus ulcer 03/08/24 who presented to [facility] ED 5/5 due to concern for not seeing right and some AMS, slurred speech and some epigastric pain/chest pain.  There was concern for possible seizures for which neurology was consulted.  The episode of concern involves patient lifting his right arm up and making repeated fist with his right hand/flapping his right hand.  At baseline, patient will commonly reach out for a triangular board to lift position directly above his bed as that brings him comfort.  Wife reports that she is concerned that his episode of repeatedly lifting his right arm up and making a fist/flapping his right hand could be a possible seizure.  She had a video of the event captured on her cell phone which she was able to show me.  The episode on my review, seems more behavioral rather than a seizure.  Instead of going up on his antiepileptic, I am going to put  him up on continuous EEG to see if we can capture 1 of these events.  RECOMMENDATIONS   - Seizure Precautions - Continue Keppra  750 mg twice daily. - LTM EEG overnight for spell capture. - Continue infectious workup and treatment for Osteomyelitis/UTI as infection can lower seizure threshold - Avoid cefepime due to its neurotoxicity and potential to cause confusion and to lower seizure threshold   Roxan Copes [facility]

## 2024-03-13 NOTE — Progress Notes (Signed)
 Informed of routine consult, discussed with primary service. ESRD patient presents with AMS, possible seizures.  Outpatient HD orders: [facility], TTS. 4 hrs. F160. EDW 75kg. RIJ TDC. Flow rates: 500/autoflow 1.5. 3K, 2.5Cal. Heparin : no, only hep locks. Meds: Mircera 200mcg every 2 weeks (last dose of ESA 4/24), on vanc 760mcg+cefepime 2g (until 5/17 and 5/13 respectively, abx for sacral OM)  Plan: -plan for routine HD tomorrow per his TTS schedule -will defer to primary service in regards to continuation of vanc and cefepime given his presentation of AMS as cefepime can cause AMS -full consult tomorrow. Please call with questions/concerns in the interim  Cristi Donalds, MD [facility]

## 2024-03-13 NOTE — ED Notes (Signed)
 Patient transported to CT

## 2024-03-13 NOTE — Progress Notes (Signed)
Pre Hd 

## 2024-03-13 NOTE — ED Provider Notes (Signed)
 [place] [facility]MENT AT [facility] Provider Note   CSN: [id number] Arrival date & time: 03/13/24  1238     History  Chief Complaint  Patient presents with   Aphasia   Chest Pain    Bruce Little is a 53 y.o. 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  He did pain shortness of breath starting this morning reports that this felt similarly to his previous stroke apparently.  Rates chest pain 3/10.  Wife reports no missed dialysis.  She reports that he recently started baclofen, some concern for either infectious etiology, medication reaction or recurrent stroke.  Also some concern for possible seizure as he has had some focal or absence appearing seizures in the past.  He has been taking his Keppra  as prescribed.   Chest Pain      Home Medications Prior to Admission medications   Medication Sig Start Date End Date Taking? Authorizing Provider  acetaminophen  (TYLENOL ) 500 MG tablet Take 1,000 mg by mouth every 6 (six) hours as needed for mild pain (pain score 1-3) or headache.    [provider]  amiodarone  (PACERONE ) 100 MG tablet Take 100 mg by mouth daily.    [provider]  amLODipine  (NORVASC ) 10 MG tablet Take 10 mg by mouth daily.    [provider]  aspirin  EC 81 MG tablet Take 81 mg by mouth at bedtime. Swallow whole. Patient not taking: Reported on 03/08/2024    [provider]  atorvastatin  (LIPITOR ) 40 MG tablet Take 40 mg by mouth at bedtime.    [provider]  baclofen (LIORESAL) 20 MG tablet Take 20 mg by mouth 2 (two) times daily. 03/06/24   [provider]  carvedilol  (COREG ) 25 MG tablet Take 1 tablet (25 mg total) by mouth 2 (two) times daily with a meal.  10/02/23   Haydee Lipa, MD  cloNIDine  (CATAPRES ) 0.1 MG tablet Take 1 tablet (0.1 mg total) by mouth daily. 10/02/23   Haydee Lipa, MD  diphenhydramine -acetaminophen  (TYLENOL  PM) 25-500 MG TABS tablet Take 2 tablets by mouth at bedtime as needed.    [provider]  finasteride  (PROSCAR ) 5 MG tablet Take 5 mg by mouth daily.    [provider]  furosemide  (LASIX ) 40 MG tablet Take 40 mg by mouth daily.    [provider]  glipiZIDE  (GLUCOTROL ) 5 MG tablet Take 1 tablet (5 mg total) by mouth daily. Patient not taking: Reported on 03/08/2024 05/21/21 05/08/24  Aura Leeds Latif, DO  hydrALAZINE  (APRESOLINE ) 25 MG tablet Take 25 mg by mouth 3 (three) times daily. Patient not taking: Reported on 03/08/2024    [provider]  LANTUS  SOLOSTAR 100 UNIT/ML Solostar Pen Inject 5 Units into the skin daily. Patient taking differently: Inject 5 Units into the skin at bedtime. 10/02/23   Haydee Lipa, MD  levETIRAcetam  (KEPPRA ) 750 MG tablet Take 1,500 mg by mouth 2 (two) times daily.    [provider]  losartan  (COZAAR ) 50 MG tablet Take 1 tablet (50 mg total) by mouth daily. 10/02/23   Haydee Lipa, MD  melatonin 5 MG TABS Take 5-10 mg by mouth at bedtime as needed (sleep).    [provider]  methocarbamol  (ROBAXIN ) 500 MG tablet Take 1 tablet (500  mg total) by mouth every 8 (eight) hours as needed for muscle spasms. Patient taking differently: Take 500 mg by mouth 2 (two) times daily. May take a third 500 mg dose midday as needed for muscle spasms 10/02/23   Haydee Lipa, MD  multivitamin (RENA-VIT) TABS tablet Take 1 tablet by mouth at bedtime. 10/02/23   Haydee Lipa, MD  mupirocin  ointment (BACTROBAN ) 2 % Apply 1 Application topically 2 (two) times daily. 12/24/23   [provider]  ondansetron  (ZOFRAN -ODT) 4 MG disintegrating tablet Take 4 mg by mouth every 8 (eight) hours as needed for nausea  or vomiting.    [provider]  pantoprazole  (PROTONIX ) 40 MG tablet Take 1 tablet (40 mg total) by mouth daily. 05/22/21   Aura Leeds Latif, DO  PARoxetine  (PAXIL ) 10 MG tablet Take 10 mg by mouth daily.    [provider]  polyethylene glycol (MIRALAX  / GLYCOLAX ) 17 g packet Take 17 g by mouth 2 (two) times daily. Patient taking differently: Take 17 g by mouth daily as needed for moderate constipation. 11/03/23   Krishnan, Gokul, MD  promethazine  (PHENERGAN ) 12.5 MG tablet Take 1 tablet (12.5 mg total) by mouth every 6 (six) hours as needed for nausea or vomiting. 01/08/24   Unk Garb, DO  promethazine  (PHENERGAN ) 25 MG suppository Place 1 suppository (25 mg total) rectally every 6 (six) hours as needed for nausea or vomiting. 01/08/24   Unk Garb, DO  senna-docusate (SENOKOT-S) 8.6-50 MG tablet Take 2 tablets by mouth 2 (two) times daily. Patient taking differently: Take 2 tablets by mouth 2 (two) times daily as needed for mild constipation. 11/03/23   Krishnan, Gokul, MD  terazosin  (HYTRIN ) 1 MG capsule Take 1 mg by mouth at bedtime.    [provider]  traZODone  (DESYREL ) 100 MG tablet Take 1 tablet (100 mg total) by mouth at bedtime as needed for sleep. 01/08/24   Unk Garb, DO  vancomycin  (VANCOCIN ) 125 MG capsule Take 1 capsule (125 mg total) by mouth 2 (two) times daily. 02/07/24   Comer, Judithann Novas, MD      Allergies    Patient has no known allergies.    Review of Systems   Review of Systems  Cardiovascular:  Positive for chest pain.  All other systems reviewed and are negative.   Physical Exam Updated Vital Signs SpO2 97%  Physical Exam Vitals and nursing note reviewed.  Constitutional:      General: He is not in acute distress.    Appearance: Normal appearance. He is ill-appearing.     Comments: Chronically ill-appearing, nontoxic, nonseptic  HENT:     Head:     Comments: Status post craniectomy with helmet in place. Eyes:     General:         Right eye: No discharge.        Left eye: No discharge.  Cardiovascular:     Rate and Rhythm: Normal rate and regular rhythm.     Heart sounds: No murmur heard.    No friction rub. No gallop.  Pulmonary:     Effort: Pulmonary effort is normal.     Breath sounds: Normal breath sounds.  Abdominal:     General: Bowel sounds are normal.     Palpations: Abdomen is soft.  Skin:    General: Skin is warm and dry.     Capillary Refill: Capillary refill takes less than 2 seconds.     Comments: Chronic sacral decubitus ulcer appears stable  compared to photos from recent admission  Neurological:     Mental Status: He is alert.     Comments: At neurologic baseline which is alert to self, aware of his birthdate but not aware of time, he is aware of please.  He has chronic left-sided strength deficit, he has normal sensation throughout.  He has left-sided facial paralysis.  Psychiatric:        Mood and Affect: Mood normal.        Behavior: Behavior normal.     ED Results / Procedures / Treatments   Labs (all labs ordered are listed, but only abnormal results are displayed) Labs Reviewed - No data to display  EKG None  Radiology No results found.  Procedures Procedures    Medications Ordered in ED Medications - No data to display  ED Course/ Medical Decision Making/ A&P                                 Medical Decision Making Amount and/or Complexity of Data Reviewed Labs: ordered. Radiology: ordered.   This patient is a 53 y.o. male  who presents to the ED for concern of altered mental status.   Differential diagnoses prior to evaluation: The emergent differential diagnosis includes, but is not limited to,  CVA, seizure, hypotension, sepsis, hypoglycemia, hypoxic encephalopathy, metabolic encephalopathy, polypharmacy, substance abuse, developing dementia or alzheimers, meningitis, encephalitis, hypertensive emergency, other systemic infection, acute alcohol intoxication, acute  alcohol or other drug withdrawal or psychiatric manifestation vs other . This is not an exhaustive differential.   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 Pertinent results include: Extensively reviewed lab work, imaging from previous ED visits, recent hospitalizations  Physical Exam: Physical exam performed. The pertinent findings include: Vital signs stable other than blood pressure elevated at 167/96.  For me he seems to be overall close to his neurologic baseline with no new strength or sensation deficits.  Wife just reports that he has been staring off a little more and that his speech seems slightly slurred.  For the questions he is able to answer I do not appreciate any dysarthria or new aphasia from baseline based on her description of his baseline.  Chronic sacral decubitus ulcer/osteomyelitis appears fairly stable.  He has a suprapubic catheter which appears appropriately seated.  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  He is chronically ill-appearing but nontoxic, nonseptic.  Lab Tests/Imaging studies: I personally interpreted labs/imaging and the pertinent results include: Leukocytosis, blood cells 14.1 as well as anemia, hemoglobin 10.1 are fairly stable compared to baseline.  Negative ethanol, initial troponin normal at 13.  Hepatic function panel noted for decreased albumin which is consistent with his protein calorie malnutrition, mildly elevated ALT at 51, alkaline phosphatase at 136, suspect also related to generalized wasting and his chronically ill state.  His BMP is notable for elevated potassium at 5.3, BUN 37, creatinine 3.44, this is higher than recent baseline, but he is during his Saturday to Tuesday dialysis, and likely somewhat elevated in this context, not out of the  realm of normal expected range for patient that is due for dialysis in the morning..  Plan from chest x-ray with some central vascular congestion, no evidence of acute infiltrate.  CT head pending at time of handoff.  I agree  with the radiologist interpretation.  Although difficult to say how clean the sample is given his chronic suprapubic status he does have large leukocytes, greater than 50 white blood cells few bacteria and white blood cell clumps.  Will send for culture but plan to treat presumptively with antibiotics.  Cardiac monitoring: EKG obtained and interpreted by myself and attending physician which shows: Normal sinus rhythm, no acute ST-T changes.   Medications: I ordered medication including cefepime for apparent urinary tract infectious disease despite his getting vancomycin  and cefepime regularly at dialysis..  I have reviewed the patients home medicines and have made adjustments as needed.   2:57 PM Care of Arrie Bienenstock Mcneel transferred to Resident Physician Arminda Landmark and Dr. Salomon Cree at the end of my shift as the patient will require reassessment once labs/imaging have resulted. Patient presentation, ED course, and plan of care discussed with review of all pertinent labs and imaging. Please see his/her note for further details regarding further ED course and disposition. Plan at time of handoff is pending CT head, reevaluation, I do not think that his UTI alone necessarily would warranted admission given that he is receiving cefepime and vancomycin  regularly for his chronic osteomyelitis, but could consider admission for EEG overnight given his history of seizures and slightly altered mental status per wife who is a good historian to his normal state. This may be altered or completely changed at the discretion of the oncoming team pending results of further workup.  Final Clinical Impression(s) / ED Diagnoses Final diagnoses:  None    Rx / DC Orders ED Discharge Orders     None          Abron Neddo H, PA-C 03/13/24 1512    Mozell Arias, MD 03/13/24 1524

## 2024-03-13 NOTE — Progress Notes (Signed)
 Pharmacy Antibiotic Note  Bruce Little is a 53 y.o. male admitted on 03/13/2024 with UTI.  Pharmacy has been consulted for Zosyn  dosing. Pt ESRD, planning for HD tomorrow.   Plan: Zosyn  2.275g q8h.  Follow culture data for de-escalation.  Monitor renal function for dose adjustments as indicated.   Height: 6\' 2"  (188 cm) Weight: 71.2 kg (157 lb) IBW/kg (Calculated) : 82.2  Temp (24hrs), Avg:98.4 F (36.9 C), Min:97.9 F (36.6 C), Max:98.9 F (37.2 C)  Recent Labs  Lab 03/13/24 1323  WBC 14.1*  CREATININE 3.44*    Estimated Creatinine Clearance: 25.6 mL/min (A) (by C-G formula based on SCr of 3.44 mg/dL (H)).    No Known Allergies  Antimicrobials this admission: Cefepime x1 5/5  Zosyn  5/5 >>   Thank you for allowing pharmacy to be a part of this patient's care.  Mamie Searles, PharmD, BCCCP  03/13/2024 7:11 PM

## 2024-03-13 NOTE — H&P (Addendum)
 History and Physical    Patient: Bruce Little ZOX:[id number] DOB: 1971/06/09 DOA: 03/13/2024 DOS: the patient was seen and examined on 03/13/2024 PCP: Street, Renford Cartwright, MD  Patient coming from:  Home Chief complaint: Chief Complaint  Patient presents with   Aphasia   Chest Pain   HPI:  Bruce Little is a 53 y.o. 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 Vikki Graves , h/o hemorrhagic cva and a.fib off AC, h/o seizures on keppra  presenting with slurred speech and LWN was 2300 last night.  In the emergency room wife called out stating patient may be having a seizure he was staring off and making repetitive motions with both his arms raised.  Patient received a loading dose of Keppra .  ED Course: Pt in ed at bedside  is Awake and alert understands commands and can only move his RUE and is bedbound with sacral decubs, Pt at baseline is bedbound and oriented. Wife states he was not looking like himself and was staring in space which is not him, He has peg that he is not using and wife wants that taken out if possible  as pt is eating now. Pt is able to move his RUE and intermittently keeps reaching above as to grab on to handle bar for PT.  Vital signs in the ED were notable for the following:  Vitals:   03/13/24 1700 03/13/24 1730 03/13/24 1800 03/13/24 1900  BP: (!) 158/98 133/68 (!) 179/91 (!) 159/78  Pulse: 84 78 88 89  Temp: 98.9 F (37.2 C)     Resp: 10 12 10 14   Height:      Weight:      SpO2: 100% 100% 100% 100%  TempSrc: Oral     BMI (Calculated):      >>ED evaluation thus far shows: CMP shows potassium of 5.3 glucose 138 BUN 37 creatinine 3.44 glucose 136 albumin 2 ALT 51 and normal LFTs otherwise.  Troponin 13 x 2. EKG sinus rhythm 83 PR 172 QTc 436, no st twi or changes.  >> CBC shows a white count of 14.1 hemoglobin of 10.1 and platelet count of 137. >> Urinalysis today is turbid with  small hemoglobin large leukocytes more than 50 WBC.  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 05/03/2023   CKD (chronic kidney disease) stage 5, GFR less than 15 ml/min (HCC) 07/02/2021   COVID    has had it 2 times, one mild and one wasn't   Depression    DM2 (diabetes mellitus, type 2) (HCC)    Family history of adverse reaction to anesthesia    Dad has a "hard time waking up" after anesthesia   GERD (gastroesophageal reflux disease)    Hemorrhagic stroke (HCC) 04/30/2023   s/p right decompressive craniectomy and evacuation of hematoma  on 04/30/2023   History of blood transfusion    HTN (hypertension)    ICH (intracerebral hemorrhage) (HCC) 09/17/2023   Osteomyelitis of fifth toe of left foot (HCC) 08/13/2021   Osteomyelitis of fourth toe of left foot (HCC) 08/13/2021   Pneumonia    Sacral decubitus ulcer 05/2023   Stroke (HCC) 05/19/2021   unable to move left side   Past Surgical History:  Procedure Laterality Date   AMPUTATION Left 08/29/2021   Procedure: AMPUTATION OF FOURTH TOE AND RAY ALONG WITH REMAINING FITH METATARSAL;  Surgeon: Wes Hamman, MD;  Location: [facility] OR;  Service: Orthopedics;  Laterality: Left;   AMPUTATION Left 09/03/2021   Procedure: LISFRANC AMPUTATION;  Surgeon: Wes Hamman, MD;  Location: [facility] OR;  Service: Orthopedics;  Laterality: Left;   AMPUTATION Left 10/29/2021   Procedure: AMPUTATION BELOW KNEE -LEFT;  Surgeon: Wes Hamman, MD;  Location: [facility] OR;  Service: Orthopedics;  Laterality: Left;   APPLICATION OF WOUND VAC Left 07/07/2021   Procedure: APPLICATION OF WOUND VAC;  Surgeon: Wes Hamman, MD;  Location: [facility] OR;  Service: Orthopedics;  Laterality: Left;   APPLICATION OF WOUND VAC Left 08/29/2021   Procedure: APPLICATION OF WOUND VAC;  Surgeon: Wes Hamman, MD;  Location: [facility] OR;  Service: Orthopedics;  Laterality: Left;   AV FISTULA PLACEMENT Left 01/18/2024   Procedure: LEFT ARM ARTERIOVENOUS (AV) FISTULA CREATION;  Surgeon: Adine Hoof, MD;  Location[facility] OR;  Service: Vascular;  Laterality: Left;   DIALYSIS/PERMA CATHETER INSERTION N/A 12/28/2023   Procedure: DIALYSIS/PERMA CATHETER INSERTION;  Surgeon: Melodie Spry, MD;  Location: [facility] INVASIVE CV LAB;  Service: Cardiovascular;  Laterality: N/A;   DIALYSIS/PERMA CATHETER REMOVAL N/A 12/28/2023   Procedure: DIALYSIS/PERMA CATHETER REMOVAL;  Surgeon: Melodie Spry, MD;  Location: [facility] INVASIVE CV LAB;  Service: Cardiovascular;  Laterality: N/A;   I & D EXTREMITY Left 07/03/2021   Procedure: IRRIGATION AND DEBRIDEMENT ,FIFTH RAY  AMPUTATION LEFT FOOT, , WOUND VAC PLACEMENT;  Surgeon: Wes Hamman, MD;  Location: [facility] OR;  Service: Orthopedics;  Laterality: Left;   I & D EXTREMITY Left 07/07/2021   Procedure: IRRIGATION AND DEBRIDEMENT LEFT FOOT;  Surgeon: Wes Hamman, MD;  Location: [facility] OR;  Service: Orthopedics;  Laterality: Left;   I & D EXTREMITY Left 08/29/2021   Procedure: IRRIGATION AND DEBRIDEMENT LEFT FOOT;  Surgeon: Wes Hamman, MD;  Location: [facility] OR;  Service: Orthopedics;  Laterality: Left;   IR CYSTOSTOMY TUBE CHANGE COMPLICATED  02/21/2024   IR FLUORO GUIDE CV LINE RIGHT  07/09/2021   IR RADIOLOGIST EVAL & MGMT  01/05/2024   suprapubic cath- pt's wife states the bleeding from that has resolved   IR REMOVAL TUN CV CATH W/O FL  10/08/2021   IR REMOVAL TUN CV CATH W/O FL  07/13/2023   IR REPLACE G-TUBE SIMPLE WO FLUORO  09/20/2023    IR US  GUIDE VASC ACCESS RIGHT  07/09/2021   TRACHEOSTOMY     05/14/2023 - 07/23/2023   VITRECTOMY Left    [facility]    reports that he has never smoked. He has never used smokeless tobacco. He reports that he does not currently use alcohol. He reports that he does not use drugs. No Known Allergies Family History  Problem Relation Age of Onset   Stroke Mother    Cancer Mother    Heart disease Father    Prior to Admission medications   Medication Sig Start Date End Date  Taking? Authorizing Provider  acetaminophen  (TYLENOL ) 500 MG tablet Take 1,000 mg by mouth every 6 (six) hours as needed for mild pain (pain score 1-3) or headache.   Yes [provider]  albuterol  (VENTOLIN  HFA) 108 (90 Base) MCG/ACT inhaler Inhale 1 puff into the lungs every 6 (six) hours as needed for wheezing or shortness of breath. 01/20/24  Yes [provider]  amiodarone  (PACERONE ) 100 MG tablet Take 100 mg by mouth every evening.   Yes [provider]  amLODipine  (NORVASC ) 10 MG tablet Take 10 mg by mouth every evening.   Yes [provider]  atorvastatin  (LIPITOR ) 40 MG tablet Take 40 mg by mouth at bedtime.   Yes [provider]  baclofen (LIORESAL) 20 MG tablet Take 10 mg by mouth 2 (two) times daily. 03/06/24  Yes [provider]  carvedilol  (COREG ) 25 MG tablet Take 1 tablet (25 mg total) by mouth 2 (two) times daily with a meal. 10/02/23  Yes Haydee Lipa, MD  collagenase (SANTYL) 250 UNIT/GM ointment Apply 1 Application topically daily.   Yes [provider]  DAKINS EX Apply 1 Application topically daily.   Yes [provider]  diphenhydramine -acetaminophen  (TYLENOL  PM) 25-500 MG TABS tablet Take 2 tablets by mouth at bedtime as needed (For sleep).   Yes [provider]  finasteride  (PROSCAR ) 5 MG tablet Take 5 mg by mouth daily.   Yes [provider]  furosemide  (LASIX ) 40 MG tablet Take 40 mg by mouth daily.    Yes [provider]  LANTUS  SOLOSTAR 100 UNIT/ML Solostar Pen Inject 5 Units into the skin daily. Patient taking differently: Inject 5 Units into the skin at bedtime. 10/02/23  Yes Haydee Lipa, MD  levETIRAcetam  (KEPPRA ) 750 MG tablet Take 1,500 mg by mouth 2 (two) times daily.   Yes [provider]  losartan  (COZAAR ) 50 MG tablet Take 1 tablet (50 mg total) by mouth daily. 10/02/23  Yes Haydee Lipa, MD  melatonin 5 MG TABS Take 5-10 mg by mouth at bedtime as needed (sleep).   Yes [provider]  methocarbamol  (ROBAXIN ) 500 MG tablet Take 1 tablet (500 mg total) by mouth every 8 (eight) hours as needed for muscle spasms. Patient taking differently: Take 500 mg by mouth 2 (two) times daily. May take a third 500 mg dose midday as needed for muscle spasms 10/02/23  Yes Haydee Lipa, MD  Multiple Vitamins-Minerals (EQ MULTIVITAMINS ADULT GUMMY PO) Take 1 tablet by mouth daily.   Yes [provider]  multivitamin (RENA-VIT) TABS tablet Take 1 tablet by mouth at bedtime. 10/02/23  Yes Haydee Lipa, MD  mupirocin  ointment (BACTROBAN ) 2 % Apply 1 Application topically 2 (two) times daily. 12/24/23  Yes [provider]  ondansetron  (ZOFRAN -ODT) 4 MG disintegrating tablet Take 4 mg by mouth every 8 (eight) hours as needed for nausea or vomiting.   Yes [provider]  pantoprazole  (PROTONIX ) 40 MG tablet Take 1 tablet (40 mg total) by mouth daily. 05/22/21  Yes Sheikh, Omair Latif, DO  PARoxetine  (PAXIL ) 10 MG tablet Take 10 mg by mouth daily.   Yes [provider]  polyethylene glycol (MIRALAX  / GLYCOLAX ) 17 g packet Take 17 g by mouth 2 (two) times daily. Patient taking differently: Take 17 g by mouth daily as needed for moderate constipation. 11/03/23  Yes Krishnan, Gokul, MD  promethazine  (PHENERGAN ) 12.5 MG tablet Take 1 tablet (12.5 mg total) by mouth every 6 (six) hours  as needed for nausea or vomiting.  01/08/24  Yes Unk Garb, DO  senna-docusate (SENOKOT-S) 8.6-50 MG tablet Take 2 tablets by mouth 2 (two) times daily. Patient taking differently: Take 2 tablets by mouth 2 (two) times daily as needed for mild constipation. 11/03/23  Yes Maylene Spear, MD  terazosin  (HYTRIN ) 1 MG capsule Take 1 mg by mouth at bedtime.   Yes [provider]  traZODone  (DESYREL ) 100 MG tablet Take 1 tablet (100 mg total) by mouth at bedtime as needed for sleep. 01/08/24  Yes Unk Garb, DO  vancomycin  (VANCOCIN ) 125 MG capsule Take 1 capsule (125 mg total) by mouth 2 (two) times daily. 02/07/24  Yes Comer, Judithann Novas, MD  cloNIDine  (CATAPRES ) 0.1 MG tablet Take 1 tablet (0.1 mg total) by mouth daily. Patient not taking: Reported on 03/13/2024 10/02/23   Haydee Lipa, MD  hydrALAZINE  (APRESOLINE ) 25 MG tablet Take 25 mg by mouth 3 (three) times daily. Patient not taking: Reported on 03/08/2024    [provider]  promethazine  (PHENERGAN ) 25 MG suppository Place 1 suppository (25 mg total) rectally every 6 (six) hours as needed for nausea or vomiting. 01/08/24   Unk Garb, DO                                                                                 Vitals:   03/13/24 1700 03/13/24 1730 03/13/24 1800 03/13/24 1900  BP: (!) 158/98 133/68 (!) 179/91 (!) 159/78  Pulse: 84 78 88 89  Resp: 10 12 10 14   Temp: 98.9 F (37.2 C)     TempSrc: Oral     SpO2: 100% 100% 100% 100%  Weight:      Height:       Physical Exam Constitutional:      Appearance: He is ill-appearing.  Eyes:     General: Lids are normal.     Extraocular Movements: Extraocular movements intact.     Comments: Anisocoria with pupils at baseline left is 2mm  fixed and NR and right is 3mm and minimally reactive.   Cardiovascular:     Rate and Rhythm: Normal rate and regular rhythm.     Heart sounds: Murmur heard.  Pulmonary:     Effort: Pulmonary effort is normal.     Breath sounds: Normal breath sounds.  Abdominal:      General: Bowel sounds are normal. There is no distension.     Tenderness: There is no abdominal tenderness.     Comments: Pt is in bed with head to right almost like torticollis due to his HD catheter per wife.   Neurological:     Mental Status: He is alert. Mental status is at baseline.     Motor: Weakness present.     Labs on Admission: I have personally reviewed following labs and imaging studies CBC: Recent Labs  Lab 03/13/24 1323  WBC 14.1*  HGB 10.1*  HCT 34.2*  MCV 93.4  PLT 177   Basic Metabolic Panel: Recent Labs  Lab 03/13/24 1323  NA 136  K 5.3*  CL 99  CO2 25  GLUCOSE 138*  BUN 37*  CREATININE 3.44*  CALCIUM  8.9   GFR: Estimated  Creatinine Clearance: 25.6 mL/min (A) (by C-G formula based on SCr of 3.44 mg/dL (H)). Liver Function Tests: Recent Labs  Lab 03/13/24 1323  AST 35  ALT 51*  ALKPHOS 136*  BILITOT 0.7  PROT 6.7  ALBUMIN 2.0*   No results for input(s): "LIPASE", "AMYLASE" in the last 168 hours. Recent Labs  Lab 03/13/24 1323  AMMONIA 34   Coagulation Profile: No results for input(s): "INR", "PROTIME" in the last 168 hours. Cardiac Enzymes: No results for input(s): "CKTOTAL", "CKMB", "CKMBINDEX", "TROPONINI" in the last 168 hours. BNP (last 3 results) No results for input(s): "PROBNP" in the last 8760 hours. HbA1C: No results for input(s): "HGBA1C" in the last 72 hours. CBG: Recent Labs  Lab 03/13/24 1825 03/13/24 1849  GLUCAP 89 91   Lipid Profile: No results for input(s): "CHOL", "HDL", "LDLCALC", "TRIG", "CHOLHDL", "LDLDIRECT" in the last 72 hours. Thyroid Function Tests: No results for input(s): "TSH", "T4TOTAL", "FREET4", "T3FREE", "THYROIDAB" in the last 72 hours. Anemia Panel: No results for input(s): "VITAMINB12", "FOLATE", "FERRITIN", "TIBC", "IRON", "RETICCTPCT" in the last 72 hours. Urine analysis:    Component Value Date/Time   COLORURINE YELLOW 03/13/2024 1323   APPEARANCEUR TURBID (A) 03/13/2024 1323    LABSPEC 1.010 03/13/2024 1323   PHURINE 8.0 03/13/2024 1323   GLUCOSEU NEGATIVE 03/13/2024 1323   HGBUR SMALL (A) 03/13/2024 1323   BILIRUBINUR NEGATIVE 03/13/2024 1323   KETONESUR NEGATIVE 03/13/2024 1323   PROTEINUR 100 (A) 03/13/2024 1323   NITRITE NEGATIVE 03/13/2024 1323   LEUKOCYTESUR LARGE (A) 03/13/2024 1323   Radiological Exams on Admission: CT Head Wo Contrast Result Date: 03/13/2024 CLINICAL DATA:  Provided history: Neuro deficit, acute, stroke suspected. EXAM: CT HEAD WITHOUT CONTRAST TECHNIQUE: Contiguous axial images were obtained from the base of the skull through the vertex without intravenous contrast. RADIATION DOSE REDUCTION: This exam was performed according to the departmental dose-optimization program which includes automated exposure control, adjustment of the mA and/or kV according to patient size and/or use of iterative reconstruction technique. COMPARISON:  Prior head CT examinations 01/19/2024 and earlier. FINDINGS: Brain: Extensive chronic encephalomalacia/gliosis again demonstrated within the right cerebral hemisphere. The right cerebral hemisphere bulges beyond the right hemicraniectomy defect and there is 5 mm rightward midline shift, similar to the prior head CT of 01/12/2024. Ex vacuo dilatation of the right lateral and third ventricles, also similar to the prior exam. Patchy ill-defined hypoattenuation elsewhere within the cerebral white matter, nonspecific but compatible with chronic small vessel ischemic disease. There is no acute intracranial hemorrhage. No acute demarcated cortical infarct. No extra-axial fluid collection. No evidence of an intracranial mass. Vascular: No hyperdense vessel.  Atherosclerotic calcifications. Skull: Right hemicraniectomy. Burr hole within the midline frontal calvarium. No acute calvarial fracture. Sinuses/Orbits: No orbital mass or acute orbital finding. Mild mucosal thickening within the right frontal and left maxillary sinuses at the  imaged levels. IMPRESSION: 1.  No evidence of an acute intracranial abnormality. 2. Extensive chronic encephalomalacia/gliosis again demonstrated within the right cerebral hemisphere. The right cerebral hemisphere bulges beyond the right hemicraniectomy defect and there is 5 mm rightward midline shift, similar to the prior head CT of 01/12/2024. 3. Background cerebral white matter chronic small vessel ischemic disease. 4. Mild paranasal sinus mucosal thickening at the imaged levels. Electronically Signed   By: Bascom Lily D.O.   On: 03/13/2024 15:51   DG Chest Portable 1 View Result Date: 03/13/2024 CLINICAL DATA:  Chest pain EXAM: PORTABLE CHEST 1 VIEW COMPARISON:  November 01, 2023 FINDINGS: Right IJ  dialysis catheter in the cavoatrial junction with bilateral reticular interstitial infiltrates that could correlate with congestive changes without consolidations Heart and mediastinum normal without pleural effusions IMPRESSION: Mild bilateral congestive changes Electronically Signed   By: Fredrich Jefferson M.D.   On: 03/13/2024 14:08   Data Reviewed: Relevant notes from primary care and specialist visits, past discharge summaries as available in EHR, including Care Everywhere. Prior diagnostic testing as pertinent to current admission diagnoses, Updated medications and problem lists for reconciliation ED course, including vitals, labs, imaging, treatment and response to treatment,Triage notes, nursing and pharmacy notes and ED provider's notes Notable results as noted in HPI.Discussed case with EDMD/ ED APP/ or Specialty MD on call and as needed.  Assessment & Plan  >>AMS: Suspect from UTI. Will change to zosyn  2/2 to seizure d/o.   >> UTI: Abnormal urinalysis. Changed to zosyn .  >> Seizure disorder: Patient received Keppra  loading dose in the emergency room, neurology following greatly appreciate consult and management.  Will resume Keppra  750 twice daily per recommendation.  >>Hypoglycemia: D5NS  at 20 and POCT q 4 hours.   >>ESRD on HD: Nephrology Consulted ,appreciate consult / management Cont Lasix  losartan .  Hydralazine . Cont coreg  and amlodipine .   >> Essential hypertension Vitals:   03/13/24 1415 03/13/24 1430 03/13/24 1445 03/13/24 1500  BP: (!) 144/87 (!) 167/96 (!) 159/109 (!) 170/106   03/13/24 1515 03/13/24 1530 03/13/24 1600 03/13/24 1630  BP: (!) 159/84 (!) 159/90 (!) 162/75 (!) 160/91   03/13/24 1700 03/13/24 1730 03/13/24 1800 03/13/24 1900  BP: (!) 158/98 133/68 (!) 179/91 (!) 159/78  Resume home regimen with Cozaar , Lasix . As needed hydralazine .   >> Sacral decubitus Skin care precautions wound care team consult as deemed appropriate.   >> History of ICH: Held heparin  as pt also has INR elevated mildly and will do SCD tonight. If pt stays over tomorrow will start heparin  for DVT prophylaxis.    DVT prophylaxis:  Scd's  Consults:  Neurology. Nephrology.  Advance Care Planning:    Code Status: Full Code   Family Communication:  None .  Disposition Plan:  Home.  Severity of Illness: The appropriate patient status for this patient is INPATIENT. Inpatient status is judged to be reasonable and necessary in order to provide the required intensity of service to ensure the patient's safety. The patient's presenting symptoms, physical exam findings, and initial radiographic and laboratory data in the context of their chronic comorbidities is felt to place them at high risk for further clinical deterioration. Furthermore, it is not anticipated that the patient will be medically stable for discharge from the hospital within 2 midnights of admission.   * I certify that at the point of admission it is my clinical judgment that the patient will require inpatient hospital care spanning beyond 2 midnights from the point of admission due to high intensity of service, high risk for further deterioration and high frequency of surveillance required.*  Unresulted  Labs (From admission, onward)     Start     Ordered   03/14/24 0500  Comprehensive metabolic panel  Tomorrow morning,   R        03/13/24 1911   03/14/24 0500  CBC  Tomorrow morning,   R        03/13/24 1911   03/13/24 1937  Renal function panel  Once,   R        03/13/24 1936   03/13/24 1936  CBC  Once,   R  03/13/24 1936   03/13/24 1840  Hepatitis B surface antigen  (New Admission Hemo Labs (Hepatitis B))  Once,   URGENT        03/13/24 1840   03/13/24 1840  Hepatitis B surface antibody,quantitative  (New Admission Hemo Labs (Hepatitis B))  Once,   URGENT        03/13/24 1840           Orders Placed This Encounter  Procedures   CT Head Wo Contrast   DG Chest Portable 1 View   Basic metabolic panel   CBC   Ethanol   Hepatic function panel   Ammonia   Urinalysis, Routine w reflex microscopic -Urine, Clean Catch   Renal function panel   CBC   Hepatitis B surface antigen   Hepatitis B surface antibody,quantitative   Comprehensive metabolic panel   CBC   Diet renal with fluid restriction Fluid restriction: 1200 mL Fluid; Room service appropriate? Yes with Assist; Fluid consistency: Thin   Document Height and Actual Weight   Informed Consent Details: Physician/Practitioner Attestation; Transcribe to consent form and obtain patient signature   Initiate Adult Central Line Maintenance and Catheter Protocol for patients with central line (CVC, PICC, Port, Hemodialysis, Trialysis)   Pre-Hemodialysis Protocol - Day of Dialysis   Post-Dialysis Protocol - Day of Dialysis   Hemodialysis Treatment Protocol   Change HD cath dressing   No Heparin  (dialysis circuit)   Swallow screen   Maintain IV access   Vital signs   Notify physician (specify)   Mobility Protocol: No Restrictions RN to initiate protocols based on patient's level of care   Refer to Sidebar Report Refer to ICU, Med-Surg, Progressive, and Step-Down Mobility Protocol Sidebars   Initiate Adult Central Line  Maintenance and Catheter Protocol for patients with central line (CVC, PICC, Port, Hemodialysis, Trialysis)   Daily weights   Intake and Output   Do not place and if present remove PureWick   Initiate Oral Care Protocol   Initiate Carrier Fluid Protocol   RN may order General Admission PRN Orders utilizing "General Admission PRN medications" (through manage orders) for the following patient needs: allergy symptoms (Claritin), cold sores (Carmex), cough (Robitussin DM), eye irritation (Liquifilm Tears), hemorrhoids (Tucks), indigestion (Maalox), minor skin irritation (Hydrocortisone Cream), muscle pain (Ben Gay), nose irritation (saline nasal spray) and sore throat (Chloraseptic spray).   Cardiac Monitoring - Continuous Indefinite   Full code   Consult to neurology   Consult to hospitalist   Consult to nephrology Consult Timeframe: ROUTINE - requires response within 24 hours; Reason for Consult? ESRD ON HD tues/thursday/ sat. pt coming for slurred speech/ seizures has h/o h'gic cva in past.   PT eval and treat   Pulse oximetry check with vital signs   Oxygen therapy Mode or (Route): Nasal cannula; Liters Per Minute: 2; Keep O2 saturation between: greater than 92 %   CBG monitoring, ED   CBG monitoring, ED   EKG 12-Lead   EKG   Overnight EEG with video   Hemodialysis inpatient   Place in observation (patient's expected length of stay will be less than 2 midnights)   Aspiration precautions   Seizure precautions   Skin care precautions    Author: Lavanda Porter, MD 12 pm -8 pm. 03/13/2024 7:40 PM >>Please note for any concern,or critical results after hours past 8pm please contact the [facility] hospitalist [facility] floor coverage provider from 7 PM- 7 AM. For on call review [email or web address], username [email or web address] and PW: your  phone number<<

## 2024-03-13 NOTE — ED Provider Notes (Signed)
 Assume Care - Medical Decision Making  Care of patient assumed from previous emergency medicine provider. See their note for further details of history, physical exam and plan.  Briefly, Bruce Little is a 53 y.o. male who presents as below:  Clinical Course as of 03/13/24 2334  Mon Mar 13, 2024  1514 S; chronic illness; hx hemorrhagic stroke with hemi-crani, sacral decub with chronic osteo; mild AMS compared to baseline and slight slurred speech; recently started baclofen; f/u CT head -> admit for unspecified AMS [WC]  1604 Seizure activity noted by nursing and informed to me.  I presented to bedside patient was doing rhythmic motion of the right upper extremity.  Therefore we will give 20/kg of Keppra  at this time.  Neurology consulted. [WC]  1853 Discussed with hospital medicine.  They are evaluating the patient for admission. [WC]    Clinical Course User Index [WC] Arminda Landmark, MD     Reassessment:  I personally reassessed the patient: Vital Signs:  The most current vitals were  Vitals:   03/13/24 2252 03/13/24 2253  BP:  136/76  Pulse: 95 98  Resp: (!) 9 13  Temp:    SpO2: 100% 100%    Hemodynamics:  The patient is hemodynamically stable. Mental Status:  The patient is at baseline except for above event in ED course.   Additional MDM/ED Course: CT head is significant for no acute intracranial abnormality.  He does have extensive chronic encephalomalacia which is similar to prior.  Patient did have a seizure-like activity here in the emergency department around 1600.  Self terminated.  We did load with 20/kg of Keppra .  I did discuss with neurology and they state that if patient has had numerous episodes today he should be admitted for observation.  I discussed with the hospitalist team and they admit the patient to their service.    Arminda Landmark, MD 03/13/24 4098    Clay Cummins, MD 03/14/24 [phone]

## 2024-03-13 NOTE — Progress Notes (Signed)
 Pt not available for EEG set up, in dialysis

## 2024-03-13 NOTE — ED Notes (Signed)
 Pt is to go to hemo dialysis then to inpatient bed. Nurse called [facility] of the unit to give them the update about this.

## 2024-03-13 NOTE — ED Triage Notes (Signed)
 Per EMS, Pt, from home, presents w/ slurred speech, chest/epigastric pain, and SOB starting this morning.  Pain score 3/10.  LKW 2300 last night.    A&Ox3.  Hx of previous strokes w/ L sided deficits, craniectomy, and dialysis.

## 2024-03-14 ENCOUNTER — Other Ambulatory Visit: Payer: Self-pay

## 2024-03-14 ENCOUNTER — Observation Stay (HOSPITAL_COMMUNITY)
Admit: 2024-03-14 | Discharge: 2024-03-14 | Disposition: A | Discharge status: Home / Self Care | Attending: Internal Medicine | Admitting: Internal Medicine

## 2024-03-14 ENCOUNTER — Observation Stay (HOSPITAL_COMMUNITY)

## 2024-03-14 DIAGNOSIS — L8990 Pressure ulcer of unspecified site, unspecified stage: Secondary | ICD-10-CM | POA: Insufficient documentation

## 2024-03-14 DIAGNOSIS — R569 Unspecified convulsions: Secondary | ICD-10-CM | POA: Diagnosis not present

## 2024-03-14 LAB — COMPREHENSIVE METABOLIC PANEL WITH GFR
ALT: 48 U/L — ABNORMAL HIGH (ref 0–44)
AST: 36 U/L (ref 15–41)
Albumin: 2 g/dL — ABNORMAL LOW (ref 3.5–5.0)
Alkaline Phosphatase: 119 U/L (ref 38–126)
Anion gap: 10 (ref 5–15)
BUN: 15 mg/dL (ref 6–20)
CO2: 25 mmol/L (ref 22–32)
Calcium: 8.4 mg/dL — ABNORMAL LOW (ref 8.9–10.3)
Chloride: 98 mmol/L (ref 98–111)
Creatinine, Ser: 1.94 mg/dL — ABNORMAL HIGH (ref 0.61–1.24)
GFR, Estimated: 41 mL/min — ABNORMAL LOW (ref >60)
Glucose, Bld: 95 mg/dL (ref 70–99)
Potassium: 4 mmol/L (ref 3.5–5.1)
Sodium: 133 mmol/L — ABNORMAL LOW (ref 135–145)
Total Bilirubin: 0.5 mg/dL (ref 0.0–1.2)
Total Protein: 6.7 g/dL (ref 6.5–8.1)

## 2024-03-14 LAB — CBC
HCT: 32.9 % — ABNORMAL LOW (ref 39.0–52.0)
Hemoglobin: 10 g/dL — ABNORMAL LOW (ref 13.0–17.0)
MCH: 27.5 pg (ref 26.0–34.0)
MCHC: 30.4 g/dL (ref 30.0–36.0)
MCV: 90.6 fL (ref 80.0–100.0)
Platelets: 147 10*3/uL — ABNORMAL LOW (ref 150–400)
RBC: 3.63 MIL/uL — ABNORMAL LOW (ref 4.22–5.81)
RDW: 19.5 % — ABNORMAL HIGH (ref 11.5–15.5)
WBC: 12.6 10*3/uL — ABNORMAL HIGH (ref 4.0–10.5)
nRBC: 0 % (ref 0.0–0.2)

## 2024-03-14 LAB — MRSA NEXT GEN BY PCR, NASAL: MRSA by PCR Next Gen: DETECTED — AB

## 2024-03-14 LAB — GLUCOSE, CAPILLARY
Glucose-Capillary: 115 mg/dL — ABNORMAL HIGH (ref 70–99)
Glucose-Capillary: 97 mg/dL (ref 70–99)
Glucose-Capillary: 139 mg/dL — ABNORMAL HIGH (ref 70–99)
Glucose-Capillary: 310 mg/dL — ABNORMAL HIGH (ref 70–99)
Glucose-Capillary: 265 mg/dL — ABNORMAL HIGH (ref 70–99)

## 2024-03-14 MED ORDER — DAKINS (1/4 STRENGTH) 0.125 % EX SOLN
1.0000 | Freq: Every day | CUTANEOUS | Status: DC
  Administered 2024-03-14 – 2024-03-17 (×4): 1
  Filled 2024-03-14: qty 473

## 2024-03-14 MED ORDER — COLLAGENASE 250 UNIT/GM EX OINT
1.0000 | TOPICAL_OINTMENT | Freq: Every day | CUTANEOUS | Status: DC
  Administered 2024-03-14 – 2024-03-17 (×4): 1 via TOPICAL
  Filled 2024-03-14: qty 30

## 2024-03-14 MED ORDER — ZINC OXIDE 40 % EX OINT
TOPICAL_OINTMENT | Freq: Two times a day (BID) | CUTANEOUS | Status: DC
  Filled 2024-03-14: qty 57

## 2024-03-14 MED ORDER — MUPIROCIN 2 % EX OINT
1.0000 | TOPICAL_OINTMENT | Freq: Every day | CUTANEOUS | Status: DC
  Administered 2024-03-14 – 2024-03-17 (×4): 1 via TOPICAL
  Filled 2024-03-14 (×2): qty 22

## 2024-03-14 MED ORDER — CHLORHEXIDINE GLUCONATE CLOTH 2 % EX PADS
6.0000 | MEDICATED_PAD | Freq: Every day | CUTANEOUS | Status: DC
  Administered 2024-03-14 – 2024-03-15 (×2): 6 via TOPICAL

## 2024-03-14 MED ORDER — MUPIROCIN 2 % EX OINT
1.0000 | TOPICAL_OINTMENT | Freq: Two times a day (BID) | CUTANEOUS | Status: DC
  Administered 2024-03-14 – 2024-03-17 (×7): 1 via NASAL
  Filled 2024-03-14 (×4): qty 22

## 2024-03-14 NOTE — Progress Notes (Addendum)
 LTM EEG hooked up and running - no initial skin breakdown - push button tested - [facility] monitoring.

## 2024-03-14 NOTE — Procedures (Signed)
 Patient Name: Bruce Little  MRN: [id number]  Epilepsy Attending: Arleene Lack  Referring Physician/Provider: Khaliqdina, Salman, MD  Duration: 5/6/20525 4098 to 03/15/2024 0343  Patient history: 53yo M presented with concern for not seeing right and some AMS, slurred speech and some epigastric pain/chest pain. EEG to evaluate for seizure  Level of alertness: Awake, asleep  AEDs during EEG study: LEV  Technical aspects: This EEG study was done with scalp electrodes positioned according to the 10-20 International system of electrode placement. Electrical activity was reviewed with band pass filter of 1-70Hz , sensitivity of 7 uV/mm, display speed of 59mm/sec with a 60Hz  notched filter applied as appropriate. EEG data were recorded continuously and digitally stored.  Video monitoring was available and reviewed as appropriate.  Description: The posterior dominant rhythm consists of 8-9 Hz activity of moderate voltage (25-35 uV) seen predominantly in posterior head regions, asymmetric ( right<left) and reactive to eye opening and eye closing. EEG also showed continuous high amplitude 3-5hz  theta-delta slowing in right hemisphere admixed with12-14Hz  beta activity in right centro-parietal region consistent with breach artifact. Polyspikes were noted in right centro-parietal region, qasi periodic at 0.25 to 0.5hz . Hyperventilation and photic stimulation were not performed.      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 Additionally there is cortical dysfunction arising from right centro-parietal region  consistent with underlying craniotomy.  Lastly there is cortical dysfunction in right hemisphere likely secondary to underlying stroke. No seizures were seen throughout the  recording.   Lynsay Fesperman O Lacey Wallman

## 2024-03-14 NOTE — Progress Notes (Addendum)
 Pt's CBG was 310 after the wife reported that pt's Dexom was 250's. Pt is asymptomatic, IV infusion of Dextrose  5% was stopped. MD was informed, no insulin  coverage was given.

## 2024-03-14 NOTE — Progress Notes (Signed)
 New Admission Note:   Arrival Method: Arrived from hemodialysis  Mental Orientation: Alert and oriented to person and place Telemetry: Box #10 Assessment: Completed Skin: Please see LDA IV: NSL-Rt FA Pain: 0/10 Tubes: Suprapubic catheter, Peg tube Safety Measures: Safety Fall Prevention Plan has been discussed.  Admission: Completed Orientation: Patient has been oriented to the room, unit and staff.  Family: None at bedside  Orders have been reviewed and implemented. Will continue to monitor the patient. Call light has been placed within reach and bed alarm has been activated.   Halla Chopp [employer], RN-BC Phone number: [phone]

## 2024-03-14 NOTE — Progress Notes (Signed)
 Not available for EEG lead application. Patient is still in dialysis, will check back as schedule allows for Long term EEG set up.

## 2024-03-14 NOTE — Hospital Course (Addendum)
 53 y.o. 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 Vikki Graves , h/o hemorrhagic cva and a.fib off AC, h/o seizures on keppra  presenting with slurred speech and LWN was 2300 last night.  In the emergency room wife called out stating patient may be having a seizure he was staring off and making repetitive motions with both his arms raised.  Patient received a loading dose of Keppra . UA was suggestive of UTI

## 2024-03-14 NOTE — Plan of Care (Signed)
   Problem: Education: Goal: Knowledge of General Education information will improve Description Including pain rating scale, medication(s)/side effects and non-pharmacologic comfort measures Outcome: Progressing

## 2024-03-14 NOTE — Progress Notes (Signed)
 Pt's suprapubic catheter was changed by Rehab RN, pending urinary collection. Wound dressing was done per order. Pt was moved to air mattress. No acute event noted.

## 2024-03-14 NOTE — Consult Note (Addendum)
 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 Stage 4 Pressure Injury sacrum 2.  Stage 3 Pressure Injury L ischium  3.  Unstageable Pressure Injury R posterior heel  4.  R dorsal foot full thickness  5.  Full thickness R knee  Pressure Injury POA: Yes Measurement: see nursing flowsheet; per [facility] 5/2 R calcaneus 2.3 cm x 2.3 cm x 0.1 cm; R dorsal foot 3.2 cm x 5 cm x 0.1 cm L ischium 2.5 cm x 3.5 cm x 2.7 cm Wound bed: sacrum appears largely clean, L ischium appears 50% red 50% necrotic, R calcaneus 90% tan slough 10% circumferential red, R dorsal foot 80% necrotic 20% red  Drainage (amount, consistency, odor) see nursing flowsheet  Periwound: buttocks appear to have some moisture associated skin damage  Dressing procedure/placement/frequency:   Cleanse sacrum and L ischial wounds with Vashe wound cleanser Timm Foot [phone]) do not rinse and allow to air dry. Using a Q tip applicator insert Dakin's moistened gauze into wound bed daily making sure to cover depth of wound.  Cover with dry gauze and silicone foam or ABD pad whichever is preferred.  Cleanse R foot wounds with Vashe, apply 1/4" thick layer of Santyl to wound bed, top with saline moist gauze, top with dry gauze and secure with Kerlix roll gauze.  R foot should be placed in a Prevalon boot to offload pressure Timm Foot [phone]).  Apply Mupirocin  ointment to R knee wound daily, cover with Telfa nonstick gauze and silicone foam.    Patient would benefit from a low air loss mattress for pressure redistribution and moisture management.    POC discussed with bedside nurse. WOC team will not follow. Re-consult if further needs arise.   Thank you,    Ronni Colace MSN, RN-BC, [employer] [phone]

## 2024-03-14 NOTE — Progress Notes (Signed)
 Progress Note   Patient: Bruce Little AOZ:[id number] DOB: 25-Sep-1971 DOA: 03/13/2024     0 DOS: the patient was seen and examined on 03/14/2024   Brief hospital course: 53 y.o. 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 Vikki Graves , h/o hemorrhagic cva and a.fib off AC, h/o seizures on keppra  presenting with slurred speech and LWN was 2300 last night.  In the emergency room wife called out stating patient may be having a seizure he was staring off and making repetitive motions with both his arms raised.  Patient received a loading dose of Keppra . UA was suggestive of UTI  Assessment and Plan: >>Toxic metabolic encephalopathy: Suspect from UTI. Pt continued on zosyn  on presentation -Urine and blood cx ordered, pending -CXR reviewed, B congestive changes   >> possible UTI: -UA suggestive of UT. Zosyn  was ordered on presentation -Blood and urine cx ordered on 5/6, pending   >> Seizure disorder: Patient received Keppra  loading dose in the emergency room, neurology following  -continued on Keppra  750 twice daily -LTM EEG per Neurology   >>Hypoglycemia: -resolved. D5 fluids d/c'd -cont to encourage po as tolerated    >>ESRD on HD: -Nephrology consulted -Cont with TTS HD.    >> Essential hypertension -continue home regimen with Cozaar , Lasix . -Cont PRN hydralazine     >> Sacral decubitus present on admit   Pressure Injury 03/14/24 Sacrum Stage 3 -  Full thickness tissue loss. Subcutaneous fat may be visible but bone, tendon or muscle are NOT exposed. (Active)  03/14/24 0148  Location: Sacrum  Location Orientation:   Staging: Stage 3 -  Full thickness tissue loss. Subcutaneous fat may be visible but bone, tendon or muscle are NOT exposed.  Wound Description (Comments):   Present on Admission: Yes     Pressure Injury 03/14/24 Buttocks Left Unstageable - Full thickness tissue loss in which the  base of the injury is covered by slough (yellow, tan, gray, green or brown) and/or eschar (tan, brown or black) in the wound bed. (Active)  03/14/24 0148  Location: Buttocks  Location Orientation: Left  Staging: Unstageable - Full thickness tissue loss in which the base of the injury is covered by slough (yellow, tan, gray, green or brown) and/or eschar (tan, brown or black) in the wound bed.  Wound Description (Comments):   Present on Admission: Yes     Pressure Injury 03/14/24 Heel Distal;Posterior;Right Unstageable - Full thickness tissue loss in which the base of the injury is covered by slough (yellow, tan, gray, green or brown) and/or eschar (tan, brown or black) in the wound bed. (Active)  03/14/24 0148  Location: Heel  Location Orientation: Distal;Posterior;Right  Staging: Unstageable - Full thickness tissue loss in which the base of the injury is covered by slough (yellow, tan, gray, green or brown) and/or eschar (tan, brown or black) in the wound bed.  Wound Description (Comments):   Present on Admission: Yes   >> History of ICH: Seems stable Head CT reviewed. Extensive chronic encephalomalacia/gliosis demonstrated w/in the R cerebral hemisphere. Findings similar to prior CT      Subjective: Without complaints  Physical Exam: Vitals:   03/13/24 2353 03/14/24 0035 03/14/24 0143 03/14/24 0502  BP: 139/72 (!) 140/74 (!) 146/83 132/78  Pulse: 95 97 94 88  Resp: 10 16    Temp:  98.7 F (37.1 C) 98.3 F (36.8 C) 98.3 F (36.8 C)  TempSrc:  Oral Oral Oral  SpO2: 100% 100% 98% 97%  Weight:      Height:       General exam: laying in bed, in nad Respiratory system: Normal respiratory effort, no wheezing Cardiovascular system: regular rate, s1, s2 Gastrointestinal system: Soft, nondistended, positive BS Central nervous system: CN2-12 grossly intact, strength intact Extremities: Perfused, no clubbing Skin: Normal skin turgor, HD cath site with dressing overtop, surrounding  area not erythematous Psychiatry: mood/affect appear normal  Data Reviewed:  Labs reviewed: Na 133, K 4.0, Cr 1.94, WBC 12.6, Hgb 10.0  Family Communication: Pt in room, family at bedside  Disposition: Status is: Observation The patient will require care spanning > 2 midnights and should be moved to inpatient because: severity of illness  Planned Discharge Destination:  Unclear at this time    Author: Cherylle Corwin, MD 03/14/2024 4:43 PM  For on call review [email or web address].

## 2024-03-14 NOTE — Progress Notes (Addendum)
 Long term EEG  placed,  pt attempting to remove leads.  Request for mittens.  Mittens placed

## 2024-03-14 NOTE — Progress Notes (Signed)
 Pt receives out-pt HD at [facility] on TTS 10:15 am chair time. Will assist as needed.   Lauraine Polite Renal Navigator [phone]

## 2024-03-14 NOTE — Consult Note (Signed)
 [facility] Renal Consultation Note    Indication for Consultation:  Management of ESRD/hemodialysis; anemia, hypertension/volume and secondary hyperparathyroidism  ZOX:WRUEAV, Renford Cartwright, MD  HPI: Bruce Little is a 53 y.o. male with ESRD on HD TTS at [facility]. He has a past medical history significant for h/o hemorrhagic cva, A.fib (off AC), h/o seizure disorder, multiple pressure ulcers, diagnosed with osteomyelitis due to positive bone culture from sacral decubitus ulcer 03/08/24  who presented to the ED with AMS and concern for possible seizures.  Seen and examined patient at bedside. Patient resting in bed talking with his wife. Currently engaging and denies SOB, CP, palpitations, and N/V. Neurology is following and EEG currently is in progress. U/A concerning for an UTI. Appears his ABXs has now been switched to IV Zosyn . Appears he received dialysis overnight. Noted patient didn't reach max UF 2nd to hypotension. Noted net UF . Current renal labs are stable. Will continue dialysis TTS schedule while he is here.  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 05/03/2023   CKD (chronic kidney disease) stage 5, GFR less than 15 ml/min (HCC) 07/02/2021   COVID    has had it 2 times, one mild and one wasn't   Depression    DM2 (diabetes mellitus, type 2) (HCC)    Family history of adverse reaction to anesthesia    Dad has a "hard time waking up" after anesthesia   GERD (gastroesophageal reflux disease)    Hemorrhagic stroke (HCC) 04/30/2023   s/p right decompressive craniectomy and evacuation of hematoma on 04/30/2023   History of blood transfusion    HTN (hypertension)    ICH (intracerebral hemorrhage) (HCC) 09/17/2023   Osteomyelitis of  fifth toe of left foot (HCC) 08/13/2021   Osteomyelitis of fourth toe of left foot (HCC) 08/13/2021   Pneumonia    Sacral decubitus ulcer 05/2023   Stroke (HCC) 05/19/2021   unable to move left side   Past Surgical History:  Procedure Laterality Date   AMPUTATION Left 08/29/2021   Procedure: AMPUTATION OF FOURTH TOE AND RAY ALONG WITH REMAINING FITH METATARSAL;  Surgeon: Wes Hamman, MD;  Location: [facility] OR;  Service: Orthopedics;  Laterality: Left;   AMPUTATION Left 09/03/2021   Procedure: LISFRANC AMPUTATION;  Surgeon: Wes Hamman, MD;  Location: [facility] OR;  Service: Orthopedics;  Laterality: Left;   AMPUTATION Left 10/29/2021   Procedure: AMPUTATION BELOW KNEE -LEFT;  Surgeon: Wes Hamman, MD;  Location: [facility] OR;  Service: Orthopedics;  Laterality: Left;   APPLICATION OF WOUND VAC Left 07/07/2021   Procedure: APPLICATION OF WOUND VAC;  Surgeon: Wes Hamman, MD;  Location: [facility] OR;  Service: Orthopedics;  Laterality: Left;   APPLICATION OF WOUND VAC Left 08/29/2021   Procedure: APPLICATION OF WOUND VAC;  Surgeon: Wes Hamman, MD;  Location: [facility] OR;  Service: Orthopedics;  Laterality: Left;   AV FISTULA PLACEMENT Left 01/18/2024   Procedure: LEFT ARM ARTERIOVENOUS (AV) FISTULA CREATION;  Surgeon: Adine Hoof, MD;  Location: [facility] OR;  Service: Vascular;  Laterality: Left;   DIALYSIS/PERMA CATHETER INSERTION N/A 12/28/2023   Procedure: DIALYSIS/PERMA CATHETER INSERTION;  Surgeon: Melodie Spry, MD;  Location: [facility] INVASIVE CV LAB;  Service: Cardiovascular;  Laterality: N/A;   DIALYSIS/PERMA CATHETER REMOVAL N/A 12/28/2023   Procedure: DIALYSIS/PERMA CATHETER REMOVAL;  Surgeon: Melodie Spry, MD;  Location: [facility] INVASIVE CV LAB;  Service: Cardiovascular;  Laterality: N/A;   I & D EXTREMITY Left 07/03/2021   Procedure: IRRIGATION AND DEBRIDEMENT ,FIFTH RAY  AMPUTATION LEFT FOOT, , WOUND VAC PLACEMENT;  Surgeon: Wes Hamman, MD;  Location: [facility] OR;  Service: Orthopedics;  Laterality: Left;    I & D EXTREMITY Left 07/07/2021   Procedure: IRRIGATION AND DEBRIDEMENT LEFT FOOT;  Surgeon: Wes Hamman, MD;  Location: [facility] OR;  Service: Orthopedics;  Laterality: Left;   I & D EXTREMITY Left 08/29/2021   Procedure: IRRIGATION AND DEBRIDEMENT LEFT FOOT;  Surgeon: Wes Hamman, MD;  Location: [facility] OR;  Service: Orthopedics;  Laterality: Left;   IR CYSTOSTOMY TUBE CHANGE COMPLICATED  02/21/2024   IR FLUORO GUIDE CV LINE RIGHT  07/09/2021   IR RADIOLOGIST EVAL & MGMT  01/05/2024   suprapubic cath- pt's wife states the bleeding from that has resolved   IR REMOVAL TUN CV CATH W/O FL  10/08/2021   IR REMOVAL TUN CV CATH W/O FL  07/13/2023   IR REPLACE G-TUBE SIMPLE WO FLUORO  09/20/2023   IR US  GUIDE VASC ACCESS RIGHT  07/09/2021   TRACHEOSTOMY     05/14/2023 - 07/23/2023   VITRECTOMY Left    [place] [facility]   Family History  Problem Relation Age of Onset   Stroke Mother    Cancer Mother    Heart disease Father    Social History:  reports that he has never smoked. He has never used smokeless tobacco. He reports that he does not currently use alcohol. He reports that he does not use drugs. No Known Allergies Prior to Admission medications   Medication Sig Start Date End Date Taking? Authorizing Provider  acetaminophen  (TYLENOL ) 500 MG tablet Take 1,000 mg by mouth every 6 (six) hours as needed for mild pain (pain score 1-3) or headache.   Yes [provider]  albuterol  (VENTOLIN  HFA) 108 (90 Base) MCG/ACT inhaler Inhale 1 puff into the lungs every 6 (six) hours as needed for wheezing or shortness of breath. 01/20/24  Yes [provider]  amiodarone  (PACERONE ) 100 MG tablet Take 100 mg by mouth every evening.   Yes [provider]  amLODipine  (NORVASC ) 10 MG tablet Take 10 mg by mouth every evening.   Yes [provider]  atorvastatin  (LIPITOR ) 40 MG tablet Take 40 mg by mouth at bedtime.   Yes [provider]  baclofen (LIORESAL) 20 MG tablet Take  10 mg by mouth 2 (two) times daily. 03/06/24  Yes [provider]  carvedilol  (COREG ) 25 MG tablet Take 1 tablet (25 mg total) by mouth 2 (two) times daily with a meal. 10/02/23  Yes Haydee Lipa, MD  collagenase (SANTYL) 250 UNIT/GM ointment Apply 1 Application topically daily.   Yes [provider]  DAKINS EX Apply 1 Application topically daily.   Yes [provider]  diphenhydramine -acetaminophen  (TYLENOL  PM) 25-500 MG TABS tablet Take 2 tablets by mouth at bedtime as needed (For sleep).   Yes [provider]  finasteride  (PROSCAR ) 5 MG tablet Take 5 mg by mouth daily.   Yes [provider]  furosemide  (LASIX ) 40 MG tablet Take 40 mg by mouth daily.   Yes [provider]  LANTUS  SOLOSTAR 100 UNIT/ML Solostar Pen Inject 5 Units into the skin daily. Patient taking differently: Inject 5  Units into the skin at bedtime. 10/02/23  Yes Haydee Lipa, MD  levETIRAcetam  (KEPPRA ) 750 MG tablet Take 1,500 mg by mouth 2 (two) times daily.   Yes [provider]  losartan  (COZAAR ) 50 MG tablet Take 1 tablet (50 mg total) by mouth daily. 10/02/23  Yes Haydee Lipa, MD  melatonin 5 MG TABS Take 5-10 mg by mouth at bedtime as needed (sleep).   Yes [provider]  methocarbamol  (ROBAXIN ) 500 MG tablet Take 1 tablet (500 mg total) by mouth every 8 (eight) hours as needed for muscle spasms. Patient taking differently: Take 500 mg by mouth 2 (two) times daily. May take a third 500 mg dose midday as needed for muscle spasms 10/02/23  Yes Haydee Lipa, MD  Multiple Vitamins-Minerals (EQ MULTIVITAMINS ADULT GUMMY PO) Take 1 tablet by mouth daily.   Yes [provider]  multivitamin (RENA-VIT) TABS tablet Take 1 tablet by mouth at bedtime. 10/02/23  Yes Haydee Lipa, MD  mupirocin  ointment (BACTROBAN ) 2 % Apply 1 Application topically 2 (two) times daily. 12/24/23  Yes [provider]   ondansetron  (ZOFRAN -ODT) 4 MG disintegrating tablet Take 4 mg by mouth every 8 (eight) hours as needed for nausea or vomiting.   Yes [provider]  pantoprazole  (PROTONIX ) 40 MG tablet Take 1 tablet (40 mg total) by mouth daily. 05/22/21  Yes Sheikh, Omair Latif, DO  PARoxetine  (PAXIL ) 10 MG tablet Take 10 mg by mouth daily.   Yes [provider]  polyethylene glycol (MIRALAX  / GLYCOLAX ) 17 g packet Take 17 g by mouth 2 (two) times daily. Patient taking differently: Take 17 g by mouth daily as needed for moderate constipation. 11/03/23  Yes Krishnan, Gokul, MD  promethazine  (PHENERGAN ) 12.5 MG tablet Take 1 tablet (12.5 mg total) by mouth every 6 (six) hours as needed for nausea or vomiting. 01/08/24  Yes Unk Garb, DO  senna-docusate (SENOKOT-S) 8.6-50 MG tablet Take 2 tablets by mouth 2 (two) times daily. Patient taking differently: Take 2 tablets by mouth 2 (two) times daily as needed for mild constipation. 11/03/23  Yes Maylene Spear, MD  terazosin  (HYTRIN ) 1 MG capsule Take 1 mg by mouth at bedtime.   Yes [provider]  traZODone  (DESYREL ) 100 MG tablet Take 1 tablet (100 mg total) by mouth at bedtime as needed for sleep. 01/08/24  Yes Unk Garb, DO  vancomycin  (VANCOCIN ) 125 MG capsule Take 1 capsule (125 mg total) by mouth 2 (two) times daily. 02/07/24  Yes Comer, Judithann Novas, MD  cloNIDine  (CATAPRES ) 0.1 MG tablet Take 1 tablet (0.1 mg total) by mouth daily. Patient not taking: Reported on 03/13/2024 10/02/23   Haydee Lipa, MD  hydrALAZINE  (APRESOLINE ) 25 MG tablet Take 25 mg by mouth 3 (three) times daily. Patient not taking: Reported on 03/08/2024    [provider]  promethazine  (PHENERGAN ) 25 MG suppository Place 1 suppository (25 mg total) rectally every 6 (six) hours as needed for nausea or vomiting. 01/08/24   Unk Garb, DO   Current Facility-Administered Medications  Medication Dose Route Frequency Provider Last Rate Last Admin    acetaminophen  (TYLENOL ) tablet 650 mg  650 mg Oral Q6H PRN Patel, Ekta V, MD       Or   acetaminophen  (TYLENOL ) suppository 650 mg  650 mg Rectal Q6H PRN Patel, Ekta V, MD       albuterol  (PROVENTIL ) (2.5 MG/3ML) 0.083% nebulizer solution 3 mL  3 mL Inhalation Q6H PRN Brunilda Capra  V, MD       amiodarone  (PACERONE ) tablet 100 mg  100 mg Oral QPM Brunilda Capra V, MD   100 mg at 03/13/24 1854   amLODipine  (NORVASC ) tablet 10 mg  10 mg Oral QPM Patel, Ekta V, MD   10 mg at 03/13/24 1952   atorvastatin  (LIPITOR ) tablet 40 mg  40 mg Oral QHS Lavanda Porter, MD       carvedilol  (COREG ) tablet 25 mg  25 mg Oral BID WC Patel, Ekta V, MD   25 mg at 03/14/24 0856   Chlorhexidine  Gluconate Cloth 2 % PADS 6 each  6 each Topical Q0600 Cristi Donalds, MD   6 each at 03/14/24 0546   Chlorhexidine  Gluconate Cloth 2 % PADS 6 each  6 each Topical Daily Lavanda Porter, MD   6 each at 03/14/24 1027   collagenase (SANTYL) ointment 1 Application  1 Application Topical Daily Oral Billings, MD   1 Application at 03/14/24 1023   dextrose  5 % and 0.45 % NaCl infusion   Intravenous Continuous Lavanda Porter, MD 20 mL/hr at 03/14/24 0545 New Bag at 03/14/24 0545   finasteride  (PROSCAR ) tablet 5 mg  5 mg Oral Daily Patel, Ekta V, MD   5 mg at 03/14/24 1610   hydrALAZINE  (APRESOLINE ) injection 10 mg  10 mg Intravenous Q6H PRN Patel, Ekta V, MD       levETIRAcetam  (KEPPRA ) tablet 750 mg  750 mg Oral BID Patel, Ekta V, MD   750 mg at 03/14/24 9604   liver oil-zinc  oxide (DESITIN) 40 % ointment   Topical BID Oral Billings, MD       losartan  (COZAAR ) tablet 50 mg  50 mg Oral Daily Patel, Ekta V, MD   50 mg at 03/14/24 5409   mupirocin  ointment (BACTROBAN ) 2 % 1 Application  1 Application Nasal BID Lavanda Porter, MD   1 Application at 03/14/24 8119   mupirocin  ointment (BACTROBAN ) 2 % 1 Application  1 Application Topical Daily Oral Billings, MD   1 Application at 03/14/24 1024   pantoprazole  (PROTONIX ) EC tablet 40 mg  40 mg Oral  Daily Patel, Ekta V, MD   40 mg at 03/14/24 0855   PARoxetine  (PAXIL ) tablet 10 mg  10 mg Oral Daily Patel, Ekta V, MD   10 mg at 03/14/24 1478   piperacillin -tazobactam (ZOSYN ) IVPB 2.25 g  2.25 g Intravenous Q8H Synthia Ewing, RPH 100 mL/hr at 03/14/24 0615 2.25 g at 03/14/24 0615   promethazine  (PHENERGAN ) tablet 12.5 mg  12.5 mg Oral Q6H PRN Patel, Ekta V, MD       sodium chloride  flush (NS) 0.9 % injection 3 mL  3 mL Intravenous Q12H Brunilda Capra V, MD   3 mL at 03/14/24 0857   sodium chloride  flush (NS) 0.9 % injection 3-10 mL  3-10 mL Intravenous Q12H Brunilda Capra V, MD   10 mL at 03/14/24 0857   sodium chloride  flush (NS) 0.9 % injection 3-10 mL  3-10 mL Intravenous PRN Patel, Ekta V, MD       sodium hypochlorite (DAKIN'S 1/4 STRENGTH) topical solution 1 Application  1 Application Irrigation Daily Oral Billings, MD   1 Application at 03/14/24 1024   terazosin  (HYTRIN ) capsule 1 mg  1 mg Oral QHS Lavanda Porter, MD       Labs: Basic Metabolic Panel: Recent Labs  Lab 03/13/24 1323 03/14/24 0448  NA 136 133*  K 5.3*  4.0  CL 99 98  CO2 25 25  GLUCOSE 138* 95  BUN 37* 15  CREATININE 3.44* 1.94*  CALCIUM  8.9 8.4*   Liver Function Tests: Recent Labs  Lab 03/13/24 1323 03/14/24 0448  AST 35 36  ALT 51* 48*  ALKPHOS 136* 119  BILITOT 0.7 0.5  PROT 6.7 6.7  ALBUMIN 2.0* 2.0*   No results for input(s): "LIPASE", "AMYLASE" in the last 168 hours. Recent Labs  Lab 03/13/24 1323  AMMONIA 34   CBC: Recent Labs  Lab 03/13/24 1323 03/14/24 0448  WBC 14.1* 12.6*  HGB 10.1* 10.0*  HCT 34.2* 32.9*  MCV 93.4 90.6  PLT 177 147*   Cardiac Enzymes: No results for input(s): "CKTOTAL", "CKMB", "CKMBINDEX", "TROPONINI" in the last 168 hours. CBG: Recent Labs  Lab 03/13/24 1849 03/13/24 2250 03/14/24 0149 03/14/24 0554 03/14/24 1126  GLUCAP 91 138* 115* 97 139*   Iron Studies: No results for input(s): "IRON", "TIBC", "TRANSFERRIN", "FERRITIN" in the last 72  hours. Studies/Results: Overnight EEG with video Result Date: 03/14/2024 Arleene Lack, MD     03/14/2024  9:44 AM Patient Name: Bruce Little MRN: [id number] Epilepsy Attending: Arleene Lack Referring Physician/Provider: Khaliqdina, Salman, MD Duration: 5/6/20525 0343 to 03/14/2024 0945 Patient history: 53yo M presented with concern for not seeing right and some AMS, slurred speech and some epigastric pain/chest pain. EEG to evaluate for seizure Level of alertness: Awake AEDs during EEG study: LEV Technical aspects: This EEG study was done with scalp electrodes positioned according to the 10-20 International system of electrode placement. Electrical activity was reviewed with band pass filter of 1-70Hz , sensitivity of 7 uV/mm, display speed of 73mm/sec with a 60Hz  notched filter applied as appropriate. EEG data were recorded continuously and digitally stored.  Video monitoring was available and reviewed as appropriate. Description: The posterior dominant rhythm consists of 8-9 Hz activity of moderate voltage (25-35 uV) seen predominantly in posterior head regions, asymmetric ( right<left) and reactive to eye opening and eye closing. EEG also showed continuous high amplitude 3-5hz  theta-delta slowing in right hemisphere admixed with12-14Hz  beta activity in right centro-parietal region consistent with breach artifact. Polyspikes were noted in right centro-parietal region, qasi periodic at 0.25 to 0.5hz . Hyperventilation and photic stimulation were not performed.    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 Additionally there is cortical dysfunction arising from right centro-parietal region  consistent with underlying craniotomy.  Lastly there is cortical dysfunction in right hemisphere likely  secondary to underlying stroke. No seizures were seen throughout the recording.  Arleene Lack   CT Head Wo Contrast Result Date: 03/13/2024 CLINICAL DATA:  Provided history: Neuro deficit, acute, stroke suspected. EXAM: CT HEAD WITHOUT CONTRAST TECHNIQUE: Contiguous axial images were obtained from the base of the skull through the vertex without intravenous contrast. RADIATION DOSE REDUCTION: This exam was performed according to the departmental dose-optimization program which includes automated exposure control, adjustment of the mA and/or kV according to patient size and/or use of iterative reconstruction technique. COMPARISON:  Prior head CT examinations 01/19/2024 and earlier. FINDINGS: Brain: Extensive chronic encephalomalacia/gliosis again demonstrated within the right cerebral hemisphere. The right cerebral hemisphere bulges beyond the right hemicraniectomy defect and there is 5 mm rightward midline shift, similar to the prior head CT of 01/12/2024. Ex vacuo dilatation of the right lateral and third ventricles,  also similar to the prior exam. Patchy ill-defined hypoattenuation elsewhere within the cerebral white matter, nonspecific but compatible with chronic small vessel ischemic disease. There is no acute intracranial hemorrhage. No acute demarcated cortical infarct. No extra-axial fluid collection. No evidence of an intracranial mass. Vascular: No hyperdense vessel.  Atherosclerotic calcifications. Skull: Right hemicraniectomy. Burr hole within the midline frontal calvarium. No acute calvarial fracture. Sinuses/Orbits: No orbital mass or acute orbital finding. Mild mucosal thickening within the right frontal and left maxillary sinuses at the imaged levels. IMPRESSION: 1.  No evidence of an acute intracranial abnormality. 2. Extensive chronic encephalomalacia/gliosis again demonstrated within the right cerebral hemisphere. The right cerebral hemisphere bulges beyond the right hemicraniectomy defect  and there is 5 mm rightward midline shift, similar to the prior head CT of 01/12/2024. 3. Background cerebral white matter chronic small vessel ischemic disease. 4. Mild paranasal sinus mucosal thickening at the imaged levels. Electronically Signed   By: Bascom Lily D.O.   On: 03/13/2024 15:51   DG Chest Portable 1 View Result Date: 03/13/2024 CLINICAL DATA:  Chest pain EXAM: PORTABLE CHEST 1 VIEW COMPARISON:  November 01, 2023 FINDINGS: Right IJ dialysis catheter in the cavoatrial junction with bilateral reticular interstitial infiltrates that could correlate with congestive changes without consolidations Heart and mediastinum normal without pleural effusions IMPRESSION: Mild bilateral congestive changes Electronically Signed   By: Fredrich Jefferson M.D.   On: 03/13/2024 14:08    ROS: All others negative except those listed in HPI.   Physical Exam: Vitals:   03/13/24 2353 03/14/24 0035 03/14/24 0143 03/14/24 0502  BP: 139/72 (!) 140/74 (!) 146/83 132/78  Pulse: 95 97 94 88  Resp: 10 16    Temp:  98.7 F (37.1 C) 98.3 F (36.8 C) 98.3 F (36.8 C)  TempSrc:  Oral Oral Oral  SpO2: 100% 100% 98% 97%  Weight:      Height:         General: NAD Head: Sclera not icteric  Lungs: Clear anteriorly. No wheeze, rales or rhonchi. Breathing is unlabored. Heart: RRR. No murmur, rubs or gallops.  Abdomen: soft and non-tender Lower extremities: no LE edema Neuro: AAOx3. No neurological deficits noted Psych:  Responds to questions appropriately with a normal affect. Dialysis Access: [facility]  Dialysis Orders:  TTS - [facility] 4 hrs. F160. EDW 75kg. RIJ TDC. Flow rates: 500/autoflow 1.5. 3K, 2.5Cal.  Heparin  No bolus, only hep locks Mircera 200 mcg q2wks - last 03/02/24 *Vanc 770mcg+cefepime 2g (until 5/17 and 5/13 respectively, abx for sacral OM)   Last Labs: Na 133 Hgb 10, K 4.0, Ca 8.4, Alb 2.0  Assessment/Plan: AMS/Concern for seizures - Neurology following: EEG in progress; on  Keppra  Concern for UTI - On IV Zosyn  ESRD - on HD TTS. Received HD overnight. Didn't reach max UF 2nd hypotension. Continue TTS schedule while here. Hypertension/volume  - Euvolemic on exam. Noted Bps dropped with HD overnight. Stopped PO Lasix . Continue Carvedilol  and Losartan  for now. Anemia of CKD - Will start ESA if Hgb drops below 10. Monitor trend Secondary Hyperparathyroidism -  Ca on lower side. Checking phos in AM.  Nutrition - Renal diet with fluid restriction  Jadene Maxwell, NP [facility] 03/14/2024, 1:38 PM

## 2024-03-14 NOTE — Progress Notes (Signed)
 PT Cancellation Note  Patient Details Name: Bruce Little MRN: [id number] DOB: December 19, 1970   Cancelled Treatment:    Reason Eval/Treat Not Completed: Patient at procedure or test/unavailable (continuous EEG). Will check back tomorrow.   Amey Ka, PT  Acute Rehab Services Secure chat preferred Office [phone]    Sharman Debar 03/14/2024, 3:31 PM

## 2024-03-14 NOTE — Progress Notes (Signed)
   03/14/24 0035  Vitals  Temp 98.7 F (37.1 C)  Temp Source Oral  BP (!) 140/74  MAP (mmHg) 97  BP Location Left Arm  BP Method Automatic  Patient Position (if appropriate) Lying  Pulse Rate 97  Pulse Rate Source Monitor  ECG Heart Rate 97  Resp 16  Oxygen Therapy  SpO2 100 %  O2 Device Room Air  During Treatment Monitoring  Cumulative Fluid Removed (mL) per Treatment  800  HD Safety Checks Performed Yes  Intra-Hemodialysis Comments Tx completed  Post Treatment  Dialyzer Clearance Lightly streaked  Liters Processed 84  Fluid Removed (mL) 800 mL  Tolerated HD Treatment Yes  Hemodialysis Catheter Right Internal jugular Double lumen Permanent (Tunneled)  Placement Date/Time: 12/28/23 1333   Placed prior to admission: No  Serial / Lot #: [id number]  Expiration Date: 04/08/30  Time Out: Correct patient;Correct site;Correct procedure  Maximum sterile barrier precautions: Mask;Cap;Sterile probe cover;Hand ...  Site Condition No complications  Blue Lumen Status Flushed  Red Lumen Status Flushed  Catheter fill solution Heparin  1000 units/ml  Catheter fill volume (Arterial) 1.9 cc  Catheter fill volume (Venous) 1.9  Dressing Type Transparent  Dressing Status Clean, Dry, Intact;Antimicrobial disc/dressing in place  Interventions New dressing  Drainage Description None  Dressing Change Due 03/19/24  Post treatment catheter status Capped and Clamped   Unable to UF 3000 as ordered due to rapid drop in blood pressure

## 2024-03-15 ENCOUNTER — Encounter (HOSPITAL_COMMUNITY): Payer: Self-pay

## 2024-03-15 ENCOUNTER — Inpatient Hospital Stay (HOSPITAL_COMMUNITY)

## 2024-03-15 ENCOUNTER — Inpatient Hospital Stay (HOSPITAL_COMMUNITY)
Admit: 2024-03-15 | Discharge: 2024-03-15 | Disposition: A | Discharge status: Home / Self Care | Attending: Internal Medicine | Admitting: Internal Medicine

## 2024-03-15 ENCOUNTER — Encounter (HOSPITAL_COMMUNITY)

## 2024-03-15 DIAGNOSIS — R404 Transient alteration of awareness: Secondary | ICD-10-CM | POA: Diagnosis not present

## 2024-03-15 DIAGNOSIS — R4182 Altered mental status, unspecified: Secondary | ICD-10-CM

## 2024-03-15 DIAGNOSIS — R29818 Other symptoms and signs involving the nervous system: Secondary | ICD-10-CM | POA: Diagnosis not present

## 2024-03-15 DIAGNOSIS — R569 Unspecified convulsions: Secondary | ICD-10-CM | POA: Diagnosis not present

## 2024-03-15 HISTORY — PX: IR GASTROSTOMY TUBE REMOVAL: IMG5492

## 2024-03-15 LAB — CBC WITH DIFFERENTIAL/PLATELET
Abs Immature Granulocytes: 0.09 10*3/uL — ABNORMAL HIGH (ref 0.00–0.07)
Basophils Absolute: 0.1 10*3/uL (ref 0.0–0.1)
Basophils Relative: 1 %
Eosinophils Absolute: 0.3 10*3/uL (ref 0.0–0.5)
Eosinophils Relative: 3 %
HCT: 30.7 % — ABNORMAL LOW (ref 39.0–52.0)
Hemoglobin: 9.2 g/dL — ABNORMAL LOW (ref 13.0–17.0)
Immature Granulocytes: 1 %
Lymphocytes Relative: 15 %
Lymphs Abs: 1.7 10*3/uL (ref 0.7–4.0)
MCH: 27.4 pg (ref 26.0–34.0)
MCHC: 30 g/dL (ref 30.0–36.0)
MCV: 91.4 fL (ref 80.0–100.0)
Monocytes Absolute: 1.3 10*3/uL — ABNORMAL HIGH (ref 0.1–1.0)
Monocytes Relative: 12 %
Neutro Abs: 7.7 10*3/uL (ref 1.7–7.7)
Neutrophils Relative %: 68 %
Platelets: 116 10*3/uL — ABNORMAL LOW (ref 150–400)
RBC: 3.36 MIL/uL — ABNORMAL LOW (ref 4.22–5.81)
RDW: 19.1 % — ABNORMAL HIGH (ref 11.5–15.5)
WBC: 11 10*3/uL — ABNORMAL HIGH (ref 4.0–10.5)
nRBC: 0 % (ref 0.0–0.2)

## 2024-03-15 LAB — HEPATITIS B SURFACE ANTIBODY, QUANTITATIVE: Hep B S AB Quant (Post): <3.5 m[IU]/mL — ABNORMAL LOW

## 2024-03-15 LAB — URINE CULTURE: Culture: >=100000 — AB

## 2024-03-15 LAB — GLUCOSE, CAPILLARY: Glucose-Capillary: 80 mg/dL (ref 70–99)

## 2024-03-15 LAB — RENAL FUNCTION PANEL
Albumin: 1.9 g/dL — ABNORMAL LOW (ref 3.5–5.0)
Anion gap: 12 (ref 5–15)
BUN: 30 mg/dL — ABNORMAL HIGH (ref 6–20)
CO2: 23 mmol/L (ref 22–32)
Calcium: 8.2 mg/dL — ABNORMAL LOW (ref 8.9–10.3)
Chloride: 98 mmol/L (ref 98–111)
Creatinine, Ser: 3.04 mg/dL — ABNORMAL HIGH (ref 0.61–1.24)
GFR, Estimated: 24 mL/min — ABNORMAL LOW (ref >60)
Glucose, Bld: 264 mg/dL — ABNORMAL HIGH (ref 70–99)
Phosphorus: 5.1 mg/dL — ABNORMAL HIGH (ref 2.5–4.6)
Potassium: 4.3 mmol/L (ref 3.5–5.1)
Sodium: 133 mmol/L — ABNORMAL LOW (ref 135–145)

## 2024-03-15 MED ORDER — CHLORHEXIDINE GLUCONATE CLOTH 2 % EX PADS
6.0000 | MEDICATED_PAD | Freq: Every day | CUTANEOUS | Status: DC
  Administered 2024-03-16 – 2024-03-17 (×2): 6 via TOPICAL

## 2024-03-15 MED ORDER — METHOCARBAMOL 500 MG PO TABS
500.0000 mg | ORAL_TABLET | Freq: Three times a day (TID) | ORAL | Status: DC | PRN
Start: 2024-03-15 — End: 2024-03-17
  Administered 2024-03-15: 500 mg via ORAL
  Filled 2024-03-15: qty 1

## 2024-03-15 MED ORDER — BACLOFEN 10 MG PO TABS
10.0000 mg | ORAL_TABLET | Freq: Every day | ORAL | Status: DC
  Administered 2024-03-15 – 2024-03-16 (×2): 10 mg via ORAL
  Filled 2024-03-15 (×2): qty 1

## 2024-03-15 MED ORDER — DARBEPOETIN ALFA 100 MCG/0.5ML IJ SOSY
100.0000 ug | PREFILLED_SYRINGE | INTRAMUSCULAR | Status: DC
  Administered 2024-03-15: 100 ug via SUBCUTANEOUS
  Filled 2024-03-15: qty 0.5

## 2024-03-15 NOTE — Procedures (Signed)
 Patient Name: Bruce Little  MRN: [id number]  Epilepsy Attending: Arleene Lack  Referring Physician/Provider: Khaliqdina, Salman, MD  Duration: 5/7/20525 0343 to 03/15/2024 0945   Patient history: 53yo M presented with concern for not seeing right and some AMS, slurred speech and some epigastric pain/chest pain. EEG to evaluate for seizure   Level of alertness: Awake, asleep   AEDs during EEG study: LEV   Technical aspects: This EEG study was done with scalp electrodes positioned according to the 10-20 International system of electrode placement. Electrical activity was reviewed with band pass filter of 1-70Hz , sensitivity of 7 uV/mm, display speed of 72mm/sec with a 60Hz  notched filter applied as appropriate. EEG data were recorded continuously and digitally stored.  Video monitoring was available and reviewed as appropriate.   Description: The posterior dominant rhythm consists of 8-9 Hz activity of moderate voltage (25-35 uV) seen predominantly in posterior head regions, asymmetric ( right<left) and reactive to eye opening and eye closing. EEG also showed continuous high amplitude 3-5hz  theta-delta slowing in right hemisphere admixed with12-14Hz  beta activity in right centro-parietal region consistent with breach artifact. Polyspikes were noted in right centro-parietal region, qasi periodic at 0.25 to 0.5hz . Hyperventilation and photic stimulation were not performed.      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 Additionally there is cortical dysfunction arising from right centro-parietal region  consistent with underlying craniotomy. Lastly there is cortical dysfunction in right hemisphere likely secondary to underlying stroke. No seizures were seen throughout the  recording.   [name] [name]

## 2024-03-15 NOTE — Procedures (Addendum)
 IR consulted by Dr. Aldona Amel for removal of balloon retention g-tube. Wife aware and agrees with plan for removal given patient is tolerating PO intake and no longer uses the tube. Balloon deflated completely and g tube removed in its entirety. No leakage from stoma immediately after removal. Dry gauze dressing applied.   Recommendations after Gtube is removed:  The patient can resume eating and drinking after 2 hours, but do not over eat as food/drink may spill out of stoma.  Some patients experience a small amount of leaking of fluid - but the hole in the stomach wall usually heals within 24 hours and the hole in the skin within a few days.  Change the dressing daily and keep the dressing dry for five days.  Patient may shower 48 hours after the tube is removed. While showering, please avoid direct water  pressure to the site for five to seven days. After showering, apply a fresh, dry dressing.  Do not apply Bacitracin, Neosporin, hydrogen peroxide or any other cleanser/ointment to the area. Clean site with warm water .  To prevent infection, do not bathe in a bathtub for at least two weeks.  Do not do exercise that puts extra pressure on the stomach for at least a week.

## 2024-03-15 NOTE — Progress Notes (Signed)
 Dr. Del Favia informed that urine output since 1900 is only 125 mls while total intake is 750 mls, bladder scan performed and noted 0 ml. No new order made.

## 2024-03-15 NOTE — TOC CM/SW Note (Addendum)
 Transition of Care [facility]) - Inpatient Brief Assessment   Patient Details  Name: Bruce Little MRN: [id number] Date of Birth: [birth year]  Transition of Care [facility]) CM/SW Contact:    Tom-Johnson, Angelique Ken, RN Phone Number: 03/15/2024, 4:23 PM   Clinical Narrative:  Patient presented to the ED with Altered Mental Status,Slurred Speech, Chest/Epigastric pain, SOB and concern for possible Seizures. Neurology following, on continuous EEG.  Patient with recent hospital admissions. Has hx of ESRD, on TTS outpatient schedule, HTN, Hemorrhagic Stroke with Lt-sided Hemiparesis, Craniotomy, Diabetes, Hyperlipidemia and chronic Sacral Decub. Patient has a Peg tube which is clamped, Wound Vac to Sacrum and Suprapubic Catheter. Nephrology and WOC following. Continues on IV abx.   From home with wife and two children, Wife is patient's primary caregiver and transports patient to and from his Outpatient Dialysis appointment. Has all necessary DME's at home. Active with [facility] for Home health disciplines. Lynette notified of patient's admit, resumption of care order will be placed when patient is Medically ready.  PCP is Street, Renford Cartwright, MD and uses [facility] on [address]    Patient not Medically ready for discharge.  CM will continue to follow as patient progresses with care towards discharge.        Transition of Care Asessment: Insurance and Status: Insurance coverage has been reviewed Patient has primary care physician: Yes Home environment has been reviewed: Yes Prior level of function:: Dependent Prior/Current Home Services: Current home services Social Drivers of Health Review: SDOH reviewed no interventions necessary Readmission risk has been reviewed: Yes Transition of care needs: transition of care needs identified, TOC will continue to follow

## 2024-03-15 NOTE — Progress Notes (Signed)
LTM EEG D/C'd. No skin break down noted. [facility] notified.

## 2024-03-15 NOTE — Evaluation (Addendum)
 Occupational Therapy Evaluation Patient Details Name: Bruce Little MRN: [id number] DOB: 06/24/1971 Today's Date: 03/15/2024   History of Present Illness   Pt is a 53 y/o M admitted on 03/13/24 after presenting with c/o slurred speech & possible seizure. Pt is being treated for toxic metabolic encephalopathy suspect 2/2 UTI, seizure. PMH: ESRD on HD TTS, hemorrhagic CVA, a-fib off AC, seizures, depression, DM2, LLE BKA     Clinical Impressions PTA, pt lives with spouse who is his primary caregiver. Pt is typically able to self feed but receives assist for bathing, dressing and toileting bed level. Pt uses hoyer lift for OOB transfers to wheelchair for transport to HD. An aide assists 2x/wk and pt was recently working with HHPT on sitting EOB- hopeful to resume this again. Pt presents at baseline for ADLs. Wife present and engaged to demonstrate proficient implementation of LUE/LE ROM, positioning and discussion of ADL mgmt. Pt able to sit EOB for 5 min with Total A x 2 for bed mobility. Will follow briefly at the acute level to ensure pt with no further decline and address any potential caregiver concerns.      If plan is discharge home, recommend the following:   A lot of help with walking and/or transfers;A lot of help with bathing/dressing/bathroom;Assistance with feeding     Functional Status Assessment   Patient has not had a recent decline in their functional status     Equipment Recommendations   None recommended by OT     Recommendations for Other Services         Precautions/Restrictions   Precautions Precautions: Fall Precaution/Restrictions Comments: old L BKA, chronic L hemi, R skull flap removed - helmet with OOB (per wife) Restrictions Weight Bearing Restrictions Per Provider Order: No     Mobility Bed Mobility Overal bed mobility: Needs Assistance Bed Mobility: Supine to Sit, Sit to Supine, Rolling Rolling: Max assist, Total assist   Supine to sit:  Total assist, +2 for physical assistance, +2 for safety/equipment, HOB elevated, Used rails Sit to supine: Total assist, +2 for safety/equipment, HOB elevated, Used rails, +2 for physical assistance        Transfers                   General transfer comment: deferred; uses lift at baseline      Balance Overall balance assessment: Needs assistance Sitting-balance support: Feet supported Sitting balance-Leahy Scale: Zero Sitting balance - Comments: pushing tendencies if BUE not in lap Postural control: Posterior lean                                 ADL either performed or assessed with clinical judgement   ADL Overall ADL's : Needs assistance/impaired;At baseline                                       General ADL Comments: Pt appears at baseline for ADLs. Discussed positioning, ROM and tone mgmt with wife proficient in this as well as ADL mgmt bed level.     Vision Ability to See in Adequate Light: 0 Adequate Patient Visual Report: No change from baseline Vision Assessment?: No apparent visual deficits     Perception         Praxis         Pertinent Vitals/Pain Pain Assessment Pain Assessment:  Faces Faces Pain Scale: Hurts little more Pain Location: back Pain Descriptors / Indicators: Discomfort Pain Intervention(s): Monitored during session, Limited activity within patient's tolerance     Extremity/Trunk Assessment Upper Extremity Assessment Upper Extremity Assessment: LUE deficits/detail;Right hand dominant LUE Deficits / Details: no active movement, PROM WFL and wife able to demo the PROM exercises that she assists pt with   Lower Extremity Assessment Lower Extremity Assessment: Defer to PT evaluation LLE Deficits / Details: knee fully flexed, able to achieve flexion ~45-50 degrees with PROM stretching   Cervical / Trunk Assessment Cervical / Trunk Assessment: Other exceptions Cervical / Trunk Exceptions: R head tilt,  L shoulder hiked up when sitting EOB   Communication Communication Communication: Impaired Factors Affecting Communication: Reduced clarity of speech   Cognition Arousal: Alert Behavior During Therapy: Flat affect Cognition: History of cognitive impairments             OT - Cognition Comments: hx of hemorrhagic CVA and seizures, follows one step commands, able to recall bands he saw at a 1990s [employer] concert when wife asked. slower processing, likely at baseline                 Following commands: Impaired Following commands impaired: Follows one step commands with increased time     Cueing  General Comments   Cueing Techniques: Verbal cues  Wife at bedside   Exercises     Shoulder Instructions      Home Living Family/patient expects to be discharged to:: Private residence Living Arrangements: Spouse/significant other Available Help at Discharge: Family;Available 24 hours/day Type of Home: House Home Access: Ramped entrance     Home Layout: Two level;Able to live on main level with bedroom/bathroom Alternate Level Stairs-Number of Steps: flight Alternate Level Stairs-Rails: Right Bathroom Shower/Tub: Producer, television/film/video: Standard     Home Equipment: Lift chair;Shower seat;Hospital bed;Wheelchair - Careers adviser (comment) (air mattress, handicap accessible van)   Additional Comments: working on getting a power chair      Prior Functioning/Environment Prior Level of Function : Needs assist             Mobility Comments: Wife uses hoyer lift to assist pt OOB 3x/week to go to dialysis, just completed HHPT where they assisted pt with sitting EOB. ADLs Comments: wife assists pt with bathing & dressing from bed level, has an aide that assists 2x/wk for bed baths. pt participates in feeding himself. Wife stretches LUE/LLE and works on repositioning to decrease skin breakdown.    OT Problem List: Decreased strength;Decreased range of  motion;Decreased activity tolerance;Impaired balance (sitting and/or standing);Decreased coordination;Impaired tone;Impaired UE functional use   OT Treatment/Interventions: Self-care/ADL training;Therapeutic exercise;Energy conservation;DME and/or AE instruction;Therapeutic activities;Patient/family education;Balance training      OT Goals(Current goals can be found in the care plan section)   Acute Rehab OT Goals Patient Stated Goal: for continued sacral wound healing, continue working with therapy at home OT Goal Formulation: With patient/family Time For Goal Achievement: 03/29/24 Potential to Achieve Goals: Fair   OT Frequency:  Min 1X/week    Co-evaluation PT/OT/SLP Co-Evaluation/Treatment: Yes Reason for Co-Treatment: For patient/therapist safety   OT goals addressed during session: ADL's and self-care;Strengthening/ROM      AM-PAC OT "6 Clicks" Daily Activity     Outcome Measure Help from another person eating meals?: A Little Help from another person taking care of personal grooming?: A Little Help from another person toileting, which includes using toliet, bedpan, or urinal?: Total Help from  another person bathing (including washing, rinsing, drying)?: A Lot Help from another person to put on and taking off regular upper body clothing?: A Lot Help from another person to put on and taking off regular lower body clothing?: Total 6 Click Score: 12   End of Session    Activity Tolerance: Patient tolerated treatment well Patient left: in bed;with call bell/phone within reach;with family/visitor present  OT Visit Diagnosis: Other abnormalities of gait and mobility (R26.89);Muscle weakness (generalized) (M62.81);Hemiplegia and hemiparesis Hemiplegia - Right/Left: Left Hemiplegia - dominant/non-dominant: Non-Dominant                Time: 4098-1191 OT Time Calculation (min): 22 min Charges:  OT General Charges $OT Visit: 1 Visit OT Evaluation $OT Eval Low Complexity: 1  Low  Lawrence Pretty, OTR/L Acute Rehab Services Office: [phone]   Annabella Barr 03/15/2024, 11:10 AM

## 2024-03-15 NOTE — Plan of Care (Signed)
  Problem: Clinical Measurements: Goal: Will remain free from infection Outcome: Not Progressing   Problem: Elimination: Goal: Will not experience complications related to bowel motility Outcome: Not Progressing   Problem: Safety: Goal: Ability to remain free from injury will improve Outcome: Not Progressing   Problem: Skin Integrity: Goal: Risk for impaired skin integrity will decrease Outcome: Not Progressing

## 2024-03-15 NOTE — Progress Notes (Signed)
 [facility] Progress Note   Subjective:    Seen and examined patient at bedside. EEG still in progress. He's awake and answering my questions appropraitely. He denies SOB, CP, and N/V. Next HD 5/8.  Objective Vitals:   03/14/24 1700 03/14/24 2100 03/15/24 0540 03/15/24 0841  BP: 127/70 110/69 103/62 134/77  Pulse: 84 83 84 81  Resp: 18 18  19   Temp: 98.7 F (37.1 C) 98.7 F (37.1 C) 98.5 F (36.9 C) 98.2 F (36.8 C)  TempSrc: Oral Oral Oral Oral  SpO2: 100% 100% 99% 99%  Weight:      Height:       Physical Exam General: Alert; awake, NAD, on RA Heart: S1 and S2; No murmurs, gallops, and rubs Lungs: Clear anteriorly Abdomen: Soft and non-tender Extremities: No LE edema Dialysis Access: [facility]   Filed Weights   03/13/24 1250  Weight: 71.2 kg    Intake/Output Summary (Last 24 hours) at 03/15/2024 1145 Last data filed at 03/15/2024 0948 Gross per 24 hour  Intake 1152.29 ml  Output 425 ml  Net 727.29 ml    Additional Objective Labs: Basic Metabolic Panel: Recent Labs  Lab 03/13/24 1323 03/14/24 0448 03/15/24 0456  NA 136 133* 133*  K 5.3* 4.0 4.3  CL 99 98 98  CO2 25 25 23   GLUCOSE 138* 95 264*  BUN 37* 15 30*  CREATININE 3.44* 1.94* 3.04*  CALCIUM  8.9 8.4* 8.2*  PHOS  --   --  5.1*   Liver Function Tests: Recent Labs  Lab 03/13/24 1323 03/14/24 0448 03/15/24 0456  AST 35 36  --   ALT 51* 48*  --   ALKPHOS 136* 119  --   BILITOT 0.7 0.5  --   PROT 6.7 6.7  --   ALBUMIN 2.0* 2.0* 1.9*   No results for input(s): "LIPASE", "AMYLASE" in the last 168 hours. CBC: Recent Labs  Lab 03/13/24 1323 03/14/24 0448 03/15/24 0456  WBC 14.1* 12.6* 11.0*  NEUTROABS  --   --  7.7  HGB 10.1* 10.0* 9.2*  HCT 34.2* 32.9* 30.7*  MCV 93.4 90.6 91.4  PLT 177 147* 116*   Blood Culture    Component Value Date/Time   SDES BLOOD SITE NOT SPECIFIED 03/14/2024 1831   SPECREQUEST  03/14/2024 1831    BOTTLES DRAWN AEROBIC AND ANAEROBIC Blood Culture  results may not be optimal due to an inadequate volume of blood received in culture bottles   CULT  03/14/2024 1831    NO GROWTH < 12 HOURS Performed at [facility], [address], [place], [place] [address]    REPTSTATUS PENDING 03/14/2024 1831    Cardiac Enzymes: No results for input(s): "CKTOTAL", "CKMB", "CKMBINDEX", "TROPONINI" in the last 168 hours. CBG: Recent Labs  Lab 03/14/24 0149 03/14/24 0554 03/14/24 1126 03/14/24 1502 03/14/24 1703  GLUCAP 115* 97 139* 310* 265*   Iron Studies: No results for input(s): "IRON", "TIBC", "TRANSFERRIN", "FERRITIN" in the last 72 hours. Lab Results  Component Value Date   INR 1.1 09/18/2023   INR 1.5 (H) 07/01/2021   INR 1.0 06/04/2021   Studies/Results: Overnight EEG with video Result Date: 03/14/2024 Arleene Lack, MD     03/15/2024  9:44 AM Patient Name: Bruce Little MRN: [id number] Epilepsy Attending: Arleene Lack Referring Physician/Provider: Khaliqdina, Salman, MD Duration: 5/6/20525 0343 to 03/15/2024 0343 Patient history: 53yo M presented with concern for not seeing right and some AMS, slurred speech and some epigastric pain/chest pain.  EEG to evaluate for seizure Level of alertness: Awake, asleep AEDs during EEG study: LEV Technical aspects: This EEG study was done with scalp electrodes positioned according to the 10-20 International system of electrode placement. Electrical activity was reviewed with band pass filter of 1-70Hz , sensitivity of 7 uV/mm, display speed of 44mm/sec with a 60Hz  notched filter applied as appropriate. EEG data were recorded continuously and digitally stored.  Video monitoring was available and reviewed as appropriate. Description: The posterior dominant rhythm consists of 8-9 Hz activity of moderate voltage (25-35 uV) seen predominantly in posterior head regions, asymmetric ( right<left) and reactive to eye opening and eye closing. EEG also showed continuous high amplitude 3-5hz  theta-delta slowing  in right hemisphere admixed with12-14Hz  beta activity in right centro-parietal region consistent with breach artifact. Polyspikes were noted in right centro-parietal region, qasi periodic at 0.25 to 0.5hz . Hyperventilation and photic stimulation were not performed.    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 Additionally there is cortical dysfunction arising from right centro-parietal region  consistent with underlying craniotomy.  Lastly there is cortical dysfunction in right hemisphere likely secondary to underlying stroke. No seizures were seen throughout the recording.  Arleene Lack   CT Head Wo Contrast Result Date: 03/13/2024 CLINICAL DATA:  Provided history: Neuro deficit, acute, stroke suspected. EXAM: CT HEAD WITHOUT CONTRAST TECHNIQUE: Contiguous axial images were obtained from the base of the skull through the vertex without intravenous contrast. RADIATION DOSE REDUCTION: This exam was performed according to the departmental dose-optimization program which includes automated exposure control, adjustment of the mA and/or kV according to patient size and/or use of iterative reconstruction technique. COMPARISON:  Prior head CT examinations 01/19/2024 and earlier. FINDINGS: Brain: Extensive chronic encephalomalacia/gliosis again demonstrated within the right cerebral hemisphere. The right cerebral hemisphere bulges beyond the right hemicraniectomy defect and there is 5 mm rightward midline shift, similar to the prior head CT of 01/12/2024. Ex vacuo dilatation of the right lateral and third ventricles, also similar to the prior exam. Patchy ill-defined hypoattenuation elsewhere within the cerebral white matter, nonspecific but compatible with chronic small vessel ischemic disease. There  is no acute intracranial hemorrhage. No acute demarcated cortical infarct. No extra-axial fluid collection. No evidence of an intracranial mass. Vascular: No hyperdense vessel.  Atherosclerotic calcifications. Skull: Right hemicraniectomy. Burr hole within the midline frontal calvarium. No acute calvarial fracture. Sinuses/Orbits: No orbital mass or acute orbital finding. Mild mucosal thickening within the right frontal and left maxillary sinuses at the imaged levels. IMPRESSION: 1.  No evidence of an acute intracranial abnormality. 2. Extensive chronic encephalomalacia/gliosis again demonstrated within the right cerebral hemisphere. The right cerebral hemisphere bulges beyond the right hemicraniectomy defect and there is 5 mm rightward midline shift, similar to the prior head CT of 01/12/2024. 3. Background cerebral white matter chronic small vessel ischemic disease. 4. Mild paranasal sinus mucosal thickening at the imaged levels. Electronically Signed   By: Bascom Lily D.O.   On: 03/13/2024 15:51   DG Chest Portable 1 View Result Date: 03/13/2024 CLINICAL DATA:  Chest pain EXAM: PORTABLE CHEST 1 VIEW COMPARISON:  November 01, 2023 FINDINGS: Right IJ dialysis catheter in the cavoatrial junction with bilateral reticular interstitial infiltrates that could correlate with congestive changes without consolidations Heart and mediastinum normal without pleural effusions IMPRESSION: Mild bilateral congestive changes Electronically Signed   By:  Fredrich Jefferson M.D.   On: 03/13/2024 14:08    Medications:  piperacillin -tazobactam (ZOSYN )  IV 2.25 g (03/15/24 0529)    amiodarone   100 mg Oral QPM   amLODipine   10 mg Oral QPM   atorvastatin   40 mg Oral QHS   carvedilol   25 mg Oral BID WC   Chlorhexidine  Gluconate Cloth  6 each Topical Daily   collagenase  1 Application Topical Daily   finasteride   5 mg Oral Daily   levETIRAcetam   750 mg Oral BID   liver oil-zinc  oxide   Topical BID   losartan   50 mg Oral Daily    mupirocin  ointment  1 Application Nasal BID   mupirocin  ointment  1 Application Topical Daily   pantoprazole   40 mg Oral Daily   PARoxetine   10 mg Oral Daily   sodium chloride  flush  3 mL Intravenous Q12H   sodium chloride  flush  3-10 mL Intravenous Q12H   sodium hypochlorite  1 Application Irrigation Daily   terazosin   1 mg Oral QHS    Dialysis Orders: TTS - [facility] 4 hrs. F160. EDW 75kg. RIJ TDC. Flow rates: 500/autoflow 1.5. 3K, 2.5Cal.  Heparin  No bolus, only hep locks Mircera 200 mcg q2wks - last 03/02/24 *Vanc 797mcg+cefepime 2g (until 5/17 and 5/13 respectively, abx for sacral OM)   Assessment/Plan: AMS/Concern for seizures - Neurology following: EEG in progress (see neurology procedure note from 5/7); on Keppra  Concern for UTI - On IV Zosyn  ESRD - on HD TTS. Received HD overnight. Didn't reach max UF 2nd hypotension. Next HD 5/8 Hypertension/volume  - Euvolemic on exam. Bps variable: soft/stable. Off Lasix . Continue Carvedilol  and Losartan  for now and will adjust further if indicated Anemia of CKD - Hgb 9.2, will start ESA here Secondary Hyperparathyroidism -  Ca on lower side and phos okay. Monitor for now. Nutrition - Renal diet with fluid restriction  Jadene Maxwell, NP [facility] 03/15/2024,11:45 AM  LOS: 1 day

## 2024-03-15 NOTE — Progress Notes (Signed)
 PROGRESS NOTE  Bruce Little MVH:[id number] DOB: Jul 26, 1971 DOA: 03/13/2024 PCP: Freya Jesus, MD   LOS: 1 day   Brief Narrative / Interim history: 53 year old male with history of prior ICH and now essentially bedbound with movement of the right upper extremity only, ESRD on TTS HD, recent brachiocephalic fistula in the right upper arm March 2025, history of seizures on Keppra  comes into the hospital with slurred speech.  There was also concern in the ER that patient may have had a seizure, staring off and making repetitive motions with the right arm l ike picking something up in the air.  Neurology was consulted and he was admitted to the hospital.  Urinalysis on admission was suggestive of a UTI.  Subjective / 24h Interval events: He is doing well this morning, knows that he is in the hospital.  He denies any chest pain, denies any shortness of breath  Assesement and Plan: Principal Problem:   AMS (altered mental status) Active Problems:   ESRD (end stage renal disease) on dialysis (HCC)   DM2 (diabetes mellitus, type 2) (HCC)   HTN (hypertension)   Paroxysmal A-fib (HCC) - Cannot be on systemic anticoagulants due to prior hemorrhagic CVA in June 2024.   S/P BKA (below knee amputation) unilateral, left (HCC)   History of hemorrhagic stroke with residual hemiparesis (HCC)   Suprapubic catheter (HCC) - placed 01-03-2024.   Seizure (HCC)   Pressure injury of skin   Principal problem Acute metabolic encephalopathy -possibly due to UTI, has been started on Zosyn , continue, monitor cultures.  Seems alert and oriented x 4 for me this morning  Active problems Concern for seizures -has received Keppra  loading dose in the ER, apparently possibly had a seizure episode on admission.  Neurology consulted, currently on LTM EEG.  Appreciate follow-up  History of PAF-continue amiodarone , Coreg .  He is not on anticoagulation following his ICH in 2024  ESRD on HD-nephrology following,  continue dialysis while here  Essential hypertension-continue Cozaar , Lasix   History of ICH-stable, control blood pressure  Chronic urinary retention-with suprapubic catheter  UTI-due to chronic catheter  Anemia-of chronic illness, chronic renal disease.  Hemoglobin stable, no bleeding  Thrombocytopenia-platelets stable, no bleeding  Hyponatremia-fluid management with HD  Decubitus ulcer, POA - noted, local wound care   Pressure Injury 03/14/24 Sacrum Stage 3 -  Full thickness tissue loss. Subcutaneous fat may be visible but bone, tendon or muscle are NOT exposed. (Active)  03/14/24 0148  Location: Sacrum  Location Orientation:   Staging: Stage 3 -  Full thickness tissue loss. Subcutaneous fat may be visible but bone, tendon or muscle are NOT exposed.  Wound Description (Comments):   Present on Admission: Yes     Pressure Injury 03/14/24 Buttocks Left Unstageable - Full thickness tissue loss in which the base of the injury is covered by slough (yellow, tan, gray, green or brown) and/or eschar (tan, brown or black) in the wound bed. (Active)  03/14/24 0148  Location: Buttocks  Location Orientation: Left  Staging: Unstageable - Full thickness tissue loss in which the base of the injury is covered by slough (yellow, tan, gray, green or brown) and/or eschar (tan, brown or black) in the wound bed.  Wound Description (Comments):   Present on Admission: Yes     Pressure Injury 03/14/24 Heel Distal;Posterior;Right Unstageable - Full thickness tissue loss in which the base of the injury is covered by slough (yellow, tan, gray, green or brown) and/or eschar (tan, brown or black) in  the wound bed. (Active)  03/14/24 0148  Location: Heel  Location Orientation: Distal;Posterior;Right  Staging: Unstageable - Full thickness tissue loss in which the base of the injury is covered by slough (yellow, tan, gray, green or brown) and/or eschar (tan, brown or black) in the wound bed.  Wound  Description (Comments):   Present on Admission: Yes     Scheduled Meds:  amiodarone   100 mg Oral QPM   amLODipine   10 mg Oral QPM   atorvastatin   40 mg Oral QHS   carvedilol   25 mg Oral BID WC   Chlorhexidine  Gluconate Cloth  6 each Topical Daily   collagenase  1 Application Topical Daily   finasteride   5 mg Oral Daily   levETIRAcetam   750 mg Oral BID   liver oil-zinc  oxide   Topical BID   losartan   50 mg Oral Daily   mupirocin  ointment  1 Application Nasal BID   mupirocin  ointment  1 Application Topical Daily   pantoprazole   40 mg Oral Daily   PARoxetine   10 mg Oral Daily   sodium chloride  flush  3 mL Intravenous Q12H   sodium chloride  flush  3-10 mL Intravenous Q12H   sodium hypochlorite  1 Application Irrigation Daily   terazosin   1 mg Oral QHS   Continuous Infusions:  piperacillin -tazobactam (ZOSYN )  IV 2.25 g (03/15/24 0529)   PRN Meds:.acetaminophen  **OR** acetaminophen , albuterol , hydrALAZINE , promethazine , sodium chloride  flush  Current Outpatient Medications  Medication Instructions   acetaminophen  (TYLENOL ) 1,000 mg, Every 6 hours PRN   albuterol  (VENTOLIN  HFA) 108 (90 Base) MCG/ACT inhaler 1 puff, Inhalation, Every 6 hours PRN   amiodarone  (PACERONE ) 100 mg, Every evening   amLODipine  (NORVASC ) 10 mg, Every evening   atorvastatin  (LIPITOR ) 40 mg, Daily at bedtime   baclofen (LIORESAL) 10 mg, Oral, 2 times daily   carvedilol  (COREG ) 25 mg, Oral, 2 times daily with meals   cloNIDine  (CATAPRES ) 0.1 mg, Oral, Daily   collagenase (SANTYL) 250 UNIT/GM ointment 1 Application, Topical, Daily   DAKINS EX 1 Application, Apply externally, Daily   diphenhydramine -acetaminophen  (TYLENOL  PM) 25-500 MG TABS tablet 2 tablets, At bedtime PRN   finasteride  (PROSCAR ) 5 mg, Daily   furosemide  (LASIX ) 40 mg, Daily   hydrALAZINE  (APRESOLINE ) 25 mg, 3 times daily   Lantus  SoloStar 5 Units, Subcutaneous, Daily   levETIRAcetam  (KEPPRA ) 1,500 mg, 2 times daily   losartan  (COZAAR ) 50  mg, Oral, Daily   melatonin 5-10 mg, At bedtime PRN   methocarbamol  (ROBAXIN ) 500 mg, Oral, Every 8 hours PRN   Multiple Vitamins-Minerals (EQ MULTIVITAMINS ADULT GUMMY PO) 1 tablet, Daily   multivitamin (RENA-VIT) TABS tablet 1 tablet, Oral, Daily at bedtime   mupirocin  ointment (BACTROBAN ) 2 % 1 Application, 2 times daily   ondansetron  (ZOFRAN -ODT) 4 mg, Every 8 hours PRN   pantoprazole  (PROTONIX ) 40 mg, Oral, Daily   PARoxetine  (PAXIL ) 10 mg, Daily   polyethylene glycol (MIRALAX  / GLYCOLAX ) 17 g, Oral, 2 times daily   promethazine  (PHENERGAN ) 12.5 mg, Oral, Every 6 hours PRN   promethazine  (PHENERGAN ) 25 mg, Rectal, Every 6 hours PRN   senna-docusate (SENOKOT-S) 8.6-50 MG tablet 2 tablets, Oral, 2 times daily   terazosin  (HYTRIN ) 1 mg, Daily at bedtime   traZODone  (DESYREL ) 100 mg, Oral, At bedtime PRN   vancomycin  (VANCOCIN ) 125 mg, Oral, 2 times daily    Diet Orders (From admission, onward)     Start     Ordered   03/13/24 1911  Diet renal with  fluid restriction Fluid restriction: 1200 mL Fluid; Room service appropriate? Yes with Assist; Fluid consistency: Thin  Diet effective now       Question Answer Comment  Fluid restriction: 1200 mL Fluid   Room service appropriate? Yes with Assist   Fluid consistency: Thin      03/13/24 1911            DVT prophylaxis: Place and maintain sequential compression device Start: 03/14/24 1656   Lab Results  Component Value Date   PLT 116 (L) 03/15/2024      Code Status: Full Code  Family Communication: no family at bedside   Status is: Inpatient Remains inpatient appropriate because: severity of illness  Level of care: Telemetry Medical  Consultants:  Neurology   Objective: Vitals:   03/14/24 1700 03/14/24 2100 03/15/24 0540 03/15/24 0841  BP: 127/70 110/69 103/62 134/77  Pulse: 84 83 84 81  Resp: 18 18  19   Temp: 98.7 F (37.1 C) 98.7 F (37.1 C) 98.5 F (36.9 C) 98.2 F (36.8 C)  TempSrc: Oral Oral Oral Oral   SpO2: 100% 100% 99% 99%  Weight:      Height:        Intake/Output Summary (Last 24 hours) at 03/15/2024 0929 Last data filed at 03/15/2024 0432 Gross per 24 hour  Intake 1402.29 ml  Output 225 ml  Net 1177.29 ml   Wt Readings from Last 3 Encounters:  03/13/24 71.2 kg  02/21/24 71.4 kg  01/18/24 77.1 kg    Examination:  Constitutional: NAD Eyes: no scleral icterus ENMT: Mucous membranes are moist.  Neck: normal, supple Respiratory: clear to auscultation bilaterally, no wheezing, no crackles.  Cardiovascular: Regular rate and rhythm, no murmurs / rubs / gallops.  Abdomen: non distended, no tenderness. Bowel sounds positive.  Musculoskeletal: no clubbing / cyanosis.   Data Reviewed: I have independently reviewed following labs and imaging studies   CBC Recent Labs  Lab 03/13/24 1323 03/14/24 0448 03/15/24 0456  WBC 14.1* 12.6* 11.0*  HGB 10.1* 10.0* 9.2*  HCT 34.2* 32.9* 30.7*  PLT 177 147* 116*  MCV 93.4 90.6 91.4  MCH 27.6 27.5 27.4  MCHC 29.5* 30.4 30.0  RDW 19.7* 19.5* 19.1*  LYMPHSABS  --   --  1.7  MONOABS  --   --  1.3*  EOSABS  --   --  0.3  BASOSABS  --   --  0.1    Recent Labs  Lab 03/13/24 1323 03/14/24 0448 03/15/24 0456  NA 136 133* 133*  K 5.3* 4.0 4.3  CL 99 98 98  CO2 25 25 23   GLUCOSE 138* 95 264*  BUN 37* 15 30*  CREATININE 3.44* 1.94* 3.04*  CALCIUM  8.9 8.4* 8.2*  AST 35 36  --   ALT 51* 48*  --   ALKPHOS 136* 119  --   BILITOT 0.7 0.5  --   ALBUMIN 2.0* 2.0* 1.9*  AMMONIA 34  --   --     ------------------------------------------------------------------------------------------------------------------ No results for input(s): "CHOL", "HDL", "LDLCALC", "TRIG", "CHOLHDL", "LDLDIRECT" in the last 72 hours.  Lab Results  Component Value Date   HGBA1C 5.1 09/18/2023   ------------------------------------------------------------------------------------------------------------------ No results for input(s): "TSH", "T4TOTAL",  "T3FREE", "THYROIDAB" in the last 72 hours.  Invalid input(s): "FREET3"  Cardiac Enzymes No results for input(s): "CKMB", "TROPONINI", "MYOGLOBIN" in the last 168 hours.  Invalid input(s): "CK" ------------------------------------------------------------------------------------------------------------------ No results found for: "BNP"  CBG: Recent Labs  Lab 03/14/24 0149 03/14/24 0554 03/14/24 1126 03/14/24  1502 03/14/24 1703  GLUCAP 115* 97 139* 310* 265*    Recent Results (from the past 240 hours)  MRSA Next Gen by PCR, Nasal     Status: Abnormal   Collection Time: 03/14/24  1:53 AM   Specimen: Nasal Mucosa; Nasal Swab  Result Value Ref Range Status   MRSA by PCR Next Gen DETECTED (A) NOT DETECTED Final    Comment: RESULT CALLED TO, READ BACK BY AND VERIFIED WITH: E CASTRO RN 03/14/2024 @ 0315 BY AB (NOTE) The GeneXpert MRSA Assay (FDA approved for NASAL specimens only), is one component of a comprehensive MRSA colonization surveillance program. It is not intended to diagnose MRSA infection nor to guide or monitor treatment for MRSA infections. Test performance is not FDA approved in patients less than 26 years old. Performed at [facility], [address], [place], [place] [address]   Culture, blood (Routine X 2) w Reflex to ID Panel     Status: None (Preliminary result)   Collection Time: 03/14/24  6:31 PM   Specimen: BLOOD  Result Value Ref Range Status   Specimen Description BLOOD SITE NOT SPECIFIED  Final   Special Requests   Final    BOTTLES DRAWN AEROBIC AND ANAEROBIC Blood Culture results may not be optimal due to an inadequate volume of blood received in culture bottles   Culture   Final    NO GROWTH < 12 HOURS Performed at [facility], [address] [place], [place] [address]    Report Status PENDING  Incomplete     Radiology Studies: Overnight EEG with video Result Date: 03/14/2024 Arleene Lack, MD     03/14/2024  9:44 AM Patient  Name: FREEMAN ZESATI MRN: [id number] Epilepsy Attending: Arleene Lack Referring Physician/Provider: Khaliqdina, Salman, MD Duration: 5/6/20525 0343 to 03/14/2024 0945 Patient history: 53yo M presented with concern for not seeing right and some AMS, slurred speech and some epigastric pain/chest pain. EEG to evaluate for seizure Level of alertness: Awake AEDs during EEG study: LEV Technical aspects: This EEG study was done with scalp electrodes positioned according to the 10-20 International system of electrode placement. Electrical activity was reviewed with band pass filter of 1-70Hz , sensitivity of 7 uV/mm, display speed of 83mm/sec with a 60Hz  notched filter applied as appropriate. EEG data were recorded continuously and digitally stored.  Video monitoring was available and reviewed as appropriate. Description: The posterior dominant rhythm consists of 8-9 Hz activity of moderate voltage (25-35 uV) seen predominantly in posterior head regions, asymmetric ( right<left) and reactive to eye opening and eye closing. EEG also showed continuous high amplitude 3-5hz  theta-delta slowing in right hemisphere admixed with12-14Hz  beta activity in right centro-parietal region consistent with breach artifact. Polyspikes were noted in right centro-parietal region, qasi periodic at 0.25 to 0.5hz . Hyperventilation and photic stimulation were not performed.    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 Additionally there is cortical dysfunction arising from right centro-parietal region  consistent with underlying craniotomy.  Lastly there is cortical dysfunction in right hemisphere likely secondary to underlying stroke. No seizures were seen throughout the recording.  Priyanka O Yadav     Romell Wolden, MD,  PhD [facility]  Between 7 am - 7 pm I am available, please contact me via Amion (for emergencies) or Securechat (non urgent messages)  Between 7 pm - 7 am I am not available, please contact night coverage MD/APP via Amion

## 2024-03-15 NOTE — Evaluation (Signed)
 Physical Therapy Evaluation Patient Details Name: Bruce Little MRN: [id number] DOB: Sep 04, 1971 Today's Date: 03/15/2024  History of Present Illness  Pt is a 53 y/o M admitted on 03/13/24 after presenting with c/o slurred speech & possible seizure. Pt is being treated for toxic metabolic encephalopathy suspect 2/2 UTI, seizure. PMH: ESRD on HD TTS, hemorrhagic CVA, a-fib off AC, seizures, depression, DM2, LLE BKA  Clinical Impression  Pt seen for PT evaluation with pt's wife arriving during session. Prior to admission pt was previously receiving HHPT services & they assisted him with sitting EOB. Pt's wife is his primary caregiver & she assisted him out of bed to w/c 3x/week to go to dialysis.  On this date, pt requires total assist +2 for supine<>sit & tolerates sitting EOB ~5 minutes with total assist +2 with decreased ability to correct posterior lean. Pt's wife hopeful to return home & restart HHPT services.  Of note, pt with baseline dizziness with change in position, noted c/o dizziness today but symptoms did not worsen with prolonged sitting EOB.      If plan is discharge home, recommend the following: Two people to help with walking and/or transfers;Two people to help with bathing/dressing/bathroom;Direct supervision/assist for medications management;Help with stairs or ramp for entrance;Assist for transportation;Assistance with feeding;Assistance with cooking/housework;Direct supervision/assist for financial management   Can travel by private vehicle        Equipment Recommendations None recommended by PT  Recommendations for Other Services       Functional Status Assessment Patient has had a recent decline in their functional status and demonstrates the ability to make significant improvements in function in a reasonable and predictable amount of time.     Precautions / Restrictions Precautions Precautions: Fall Precaution/Restrictions Comments: old L BKA, chronic L hemi, R skull  flap removed - helmet with OOB (per wife) Restrictions Weight Bearing Restrictions Per Provider Order: No      Mobility  Bed Mobility Overal bed mobility: Needs Assistance Bed Mobility: Supine to Sit, Sit to Supine, Rolling Rolling: Max assist, Total assist (max assist roll L, total assist roll R)   Supine to sit: Total assist, +2 for physical assistance, +2 for safety/equipment, HOB elevated, Used rails Sit to supine: Total assist, +2 for safety/equipment, HOB elevated, Used rails, +2 for physical assistance        Transfers                        Ambulation/Gait                  Stairs            Wheelchair Mobility     Tilt Bed    Modified Rankin (Stroke Patients Only)       Balance Overall balance assessment: Needs assistance Sitting-balance support: Feet supported Sitting balance-Leahy Scale: Zero   Postural control: Posterior lean                                   Pertinent Vitals/Pain Pain Assessment Pain Assessment: Faces Faces Pain Scale: Hurts little more Pain Location: back Pain Descriptors / Indicators: Discomfort Pain Intervention(s): Monitored during session, Limited activity within patient's tolerance    Home Living Family/patient expects to be discharged to:: Private residence Living Arrangements: Spouse/significant other Available Help at Discharge: Family;Available 24 hours/day Type of Home: House Home Access: Ramped entrance  Home Layout: Two level;Able to live on main level with bedroom/bathroom Home Equipment: Lift chair;Shower seat;Hospital bed;Wheelchair - Careers adviser (comment) (air mattress, handicap accessible van)      Prior Function               Mobility Comments: Wife uses hoyer lift to assist pt OOB 3x/week to go to dialysis, just completed HHPT where they assisted pt with sitting EOB. ADLs Comments: wife assists pt with bathing & dressing from bed level, pt participates  in feeding himself     Extremity/Trunk Assessment   Upper Extremity Assessment Upper Extremity Assessment: LUE deficits/detail;Defer to OT evaluation (no active movement noted in LUE)    Lower Extremity Assessment Lower Extremity Assessment: LLE deficits/detail LLE Deficits / Details: knee fully flexed, able to achieve flexion ~45-50 degrees with PROM stretching       Communication   Communication Communication: Impaired Factors Affecting Communication: Reduced clarity of speech    Cognition Arousal: Alert Behavior During Therapy: Flat affect   PT - Cognitive impairments: Initiation, Attention, Problem solving                         Following commands: Impaired Following commands impaired: Follows one step commands with increased time     Cueing Cueing Techniques: Verbal cues     General Comments      Exercises     Assessment/Plan    PT Assessment Patient needs continued PT services  PT Problem List Decreased strength;Decreased coordination;Decreased range of motion;Decreased activity tolerance;Decreased cognition;Decreased balance;Decreased mobility;Decreased skin integrity;Decreased safety awareness       PT Treatment Interventions Neuromuscular re-education;Therapeutic activities;Therapeutic exercise;Manual techniques;Balance training;Functional mobility training;Patient/family education;Cognitive remediation    PT Goals (Current goals can be found in the Care Plan section)  Acute Rehab PT Goals Patient Stated Goal: go home & resume HHPT PT Goal Formulation: With patient/family Time For Goal Achievement: 03/29/24 Potential to Achieve Goals: Fair Additional Goals Additional Goal #1: Pt will tolerate sitting EOB x 10 minutes with support to promote core/trunk strength & decrease skin breakdown 2/2 prolonged lying in bed.    Frequency Min 1X/week     Co-evaluation PT/OT/SLP Co-Evaluation/Treatment: Yes Reason for Co-Treatment: For  patient/therapist safety           AM-PAC PT "6 Clicks" Mobility  Outcome Measure Help needed turning from your back to your side while in a flat bed without using bedrails?: Total Help needed moving from lying on your back to sitting on the side of a flat bed without using bedrails?: Total Help needed moving to and from a bed to a chair (including a wheelchair)?: Total Help needed standing up from a chair using your arms (e.g., wheelchair or bedside chair)?: Total Help needed to walk in hospital room?: Total Help needed climbing 3-5 steps with a railing? : Total 6 Click Score: 6    End of Session   Activity Tolerance: Patient tolerated treatment well Patient left: in bed;with call bell/phone within reach;with family/visitor present (OT still in room) Nurse Communication:  (need for new sacral patch 2/2 BM) PT Visit Diagnosis: Other abnormalities of gait and mobility (R26.89);Hemiplegia and hemiparesis;Muscle weakness (generalized) (M62.81) Hemiplegia - Right/Left: Left Hemiplegia - dominant/non-dominant: Dominant    Time: 8119-1478 PT Time Calculation (min) (ACUTE ONLY): 35 min   Charges:   PT Evaluation $PT Eval High Complexity: 1 High   PT General Charges $$ ACUTE PT VISIT: 1 Visit  Emaline Handsome, PT, DPT 03/15/24, 10:17 AM   Venetta Gill 03/15/2024, 10:15 AM

## 2024-03-15 NOTE — Progress Notes (Signed)
 Patient strongly refused to be turned every 2 hours and told this nurse "I am fine" when asked if we can turn him now as he has been refusing earlier. He only agrees turning if he needs to be cleaned, educated on importance of turning especially on his wound healing for pressure sores.

## 2024-03-15 NOTE — Progress Notes (Signed)
 NEUROLOGY CONSULT FOLLOW UP NOTE   Date of service: Mar 15, 2024 Patient Name: Bruce Little MRN:  [id number] DOB:  Nov 11, 1970  Interval Hx/subjective   Wife is att he bedside. Patient is laying in bed in NAD. He c/o being nauseous.  NO new neurological events overnight  LTM with no seizures. Will d/c today   Vitals   Vitals:   03/14/24 1700 03/14/24 2100 03/15/24 0540 03/15/24 0841  BP: 127/70 110/69 103/62 134/77  Pulse: 84 83 84 81  Resp: 18 18  19   Temp: 98.7 F (37.1 C) 98.7 F (37.1 C) 98.5 F (36.9 C) 98.2 F (36.8 C)  TempSrc: Oral Oral Oral Oral  SpO2: 100% 100% 99% 99%  Weight:      Height:         Body mass index is 20.16 kg/m.  Physical Exam   Constitutional: Appears well-developed and well-nourished.  Psych: Affect appropriate to situation.  Eyes: No scleral injection.  HENT: No OP obstrucion.  Head: Normocephalic.  Cardiovascular: Normal rate and regular rhythm.  Respiratory: Effort normal, non-labored breathing.  GI: Soft.  No distension. There is no tenderness.  Skin: WDI. Left BKA   Neurologic Examination   Mental Status -  He is awake and alert in NAD. He is oriented to self, age and place. No aphasia Mild dysarthria Able to follow commands   Cranial Nerves II - XII - II - Visual field intact OU . III, IV, VI - Extraocular movements intact . V - Facial sensation intact bilaterally . VII - left facial droop VIII - Hearing & vestibular intact bilaterally . X - Palate elevates symmetrically . XI - Chin turning & shoulder shrug on right. XII - Tongue protrusion intact .  Motor Strength - left arm is flaccid left BKA.  Bulk was normal and fasciculations were absent .  Left arm with contractures Motor Tone - Muscle tone was assessed at the neck and appendages and was normal . Sensory - no response to left arm and left leg Coordination - The patient had normal movements in the hands and feet with no ataxia or dysmetria.  Tremor was absent. Gait  and Station - deferred.    Medications  Current Facility-Administered Medications:    acetaminophen  (TYLENOL ) tablet 650 mg, 650 mg, Oral, Q6H PRN **OR** acetaminophen  (TYLENOL ) suppository 650 mg, 650 mg, Rectal, Q6H PRN, Lydia Sams, Ekta V, MD   albuterol  (PROVENTIL ) (2.5 MG/3ML) 0.083% nebulizer solution 3 mL, 3 mL, Inhalation, Q6H PRN, Lavanda Porter, MD   amiodarone  (PACERONE ) tablet 100 mg, 100 mg, Oral, QPM, Brunilda Capra V, MD, 100 mg at 03/14/24 1732   amLODipine  (NORVASC ) tablet 10 mg, 10 mg, Oral, QPM, Brunilda Capra V, MD, 10 mg at 03/14/24 1732   atorvastatin  (LIPITOR ) tablet 40 mg, 40 mg, Oral, QHS, Patel, Ekta V, MD, 40 mg at 03/14/24 2126   carvedilol  (COREG ) tablet 25 mg, 25 mg, Oral, BID WC, Patel, Ekta V, MD, 25 mg at 03/15/24 0909   [START ON 03/16/2024] Chlorhexidine  Gluconate Cloth 2 % PADS 6 each, 6 each, Topical, Q0600, Kitty Perkins, NP   collagenase (SANTYL) ointment 1 Application, 1 Application, Topical, Daily, Oral Billings, MD, 1 Application at 03/15/24 0910   Darbepoetin Alfa  (ARANESP ) injection 100 mcg, 100 mcg, Subcutaneous, Q Wed-1800, Jadene Maxwell E, NP   finasteride  (PROSCAR ) tablet 5 mg, 5 mg, Oral, Daily, Brunilda Capra V, MD, 5 mg at 03/15/24 0909   hydrALAZINE  (APRESOLINE ) injection 10 mg, 10 mg,  Intravenous, Q6H PRN, Lavanda Porter, MD   levETIRAcetam  (KEPPRA ) tablet 750 mg, 750 mg, Oral, BID, Brunilda Capra V, MD, 750 mg at 03/15/24 0908   liver oil-zinc  oxide (DESITIN) 40 % ointment, , Topical, BID, Oral Billings, MD, Given at 03/15/24 0910   losartan  (COZAAR ) tablet 50 mg, 50 mg, Oral, Daily, Brunilda Capra V, MD, 50 mg at 03/15/24 0908   mupirocin  ointment (BACTROBAN ) 2 % 1 Application, 1 Application, Nasal, BID, Lavanda Porter, MD, 1 Application at 03/15/24 0909   mupirocin  ointment (BACTROBAN ) 2 % 1 Application, 1 Application, Topical, Daily, Oral Billings, MD, 1 Application at 03/15/24 0909   pantoprazole  (PROTONIX ) EC tablet 40 mg, 40 mg, Oral, Daily,  Brunilda Capra V, MD, 40 mg at 03/15/24 0909   PARoxetine  (PAXIL ) tablet 10 mg, 10 mg, Oral, Daily, Brunilda Capra V, MD, 10 mg at 03/15/24 0909   piperacillin -tazobactam (ZOSYN ) IVPB 2.25 g, 2.25 g, Intravenous, Q8H, Synthia Ewing, RPH, Last Rate: 100 mL/hr at 03/15/24 0529, 2.25 g at 03/15/24 0529   promethazine  (PHENERGAN ) tablet 12.5 mg, 12.5 mg, Oral, Q6H PRN, Brunilda Capra V, MD, 12.5 mg at 03/15/24 1156   sodium chloride  flush (NS) 0.9 % injection 3 mL, 3 mL, Intravenous, Q12H, Brunilda Capra V, MD, 3 mL at 03/15/24 0910   sodium chloride  flush (NS) 0.9 % injection 3-10 mL, 3-10 mL, Intravenous, Q12H, Brunilda Capra V, MD, 10 mL at 03/15/24 0910   sodium chloride  flush (NS) 0.9 % injection 3-10 mL, 3-10 mL, Intravenous, PRN, Lavanda Porter, MD   sodium hypochlorite (DAKIN'S 1/4 STRENGTH) topical solution 1 Application, 1 Application, Irrigation, Daily, Oral Billings, MD, 1 Application at 03/15/24 1478   terazosin  (HYTRIN ) capsule 1 mg, 1 mg, Oral, QHS, Brunilda Capra V, MD, 1 mg at 03/14/24 2126  Labs and Diagnostic Imaging   CBC:  Recent Labs  Lab 03/14/24 0448 03/15/24 0456  WBC 12.6* 11.0*  NEUTROABS  --  7.7  HGB 10.0* 9.2*  HCT 32.9* 30.7*  MCV 90.6 91.4  PLT 147* 116*    Basic Metabolic Panel:  Lab Results  Component Value Date   NA 133 (L) 03/15/2024   K 4.3 03/15/2024   CO2 23 03/15/2024   GLUCOSE 264 (H) 03/15/2024   BUN 30 (H) 03/15/2024   CREATININE 3.04 (H) 03/15/2024   CALCIUM  8.2 (L) 03/15/2024   GFRNONAA 24 (L) 03/15/2024   Lipid Panel:  Lab Results  Component Value Date   LDLCALC UNABLE TO CALCULATE IF TRIGLYCERIDE OVER 400 mg/dL 29/56/2130   QMVH8I:  Lab Results  Component Value Date   HGBA1C 5.1 09/18/2023   Urine Drug Screen:     Component Value Date/Time   LABOPIA NONE DETECTED 05/19/2021 1950   COCAINSCRNUR NONE DETECTED 05/19/2021 1950   LABBENZ NONE DETECTED 05/19/2021 1950   AMPHETMU NONE DETECTED 05/19/2021 1950   THCU NONE DETECTED 05/19/2021  1950   LABBARB NONE DETECTED 05/19/2021 1950    Alcohol Level     Component Value Date/Time   ETH <15 03/13/2024 1323   INR  Lab Results  Component Value Date   INR 1.1 09/18/2023   APTT  Lab Results  Component Value Date   APTT 29 09/18/2023   AED levels: No results found for: "PHENYTOIN", "ZONISAMIDE", "LAMOTRIGINE", "LEVETIRACETA"  CT Head without contrast(Personally reviewed): No evidence of acute intracranial abnormality Extensive chronic encephalomalacia right cerebral hemisphere.  Right cerebral hemisphere bulges beyond right hemicraniectomy defect with 5 mm rightward midline shift,  similar to prior head CT of 01/12/2024 Background cerebral white matter chronic small vessel ischemic disease.    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 Additionally there is cortical dysfunction arising from right centro-parietal region consistent with underlying craniotomy. Lastly there is cortical dysfunction in right hemisphere likely secondary to underlying stroke. No seizures were seen throughout the recording.   Assessment   COLTAN FRANCHETTI is a 53 y.o. male hx of intracranial bleed with craniectomy June 2024, residual flaccid LUE, ESRD on HD, atrial fibrillation (previously on ASA but stopped taking due to persistent hematuria), left BKA, multiple pressure ulcers,diagnosed with osteomyelitis due to positive bone culture from sacral decubitus ulcer 03/08/24 who presented to [facility] ED 5/5 due to concern for not seeing right and some AMS, slurred speech and some epigastric pain/chest pain.   There was concern for possible seizures for which neurology was consulted.  The episode of concern involves patient lifting his right arm up and making repeated fist with his right hand/flapping his right hand.  At baseline, patient will commonly reach out for a triangular handle of an ortho lift positioned directly above his bed as  that brings him comfort.   Wife reports that she is concerned that his episode of repeatedly lifting his right arm up and making a fist/flapping his right hand could be a possible seizure.  She had a video of the event captured on her cell phone which she was able to show me.  The episode on my review, seems more behavioral rather than a seizure.  Instead of going up on his antiepileptic, we put him up on continuous EEG to see if we can capture 1 of these events. LTM EEG with no events capture over the last 2 days.  I have very low suspicion that the event was a seizure and therefore, I will not increase his Keppra .   Recommendations   - Seizure Precautions - Continue Keppra  750 mg twice daily. - Discontinue LTM  - Continue infectious workup and treatment for Osteomyelitis/UTI as infection can lower seizure threshold - Avoid cefepime due to its neurotoxicity and potential to cause confusion and to lower seizure threshold - neurology will signoff. Please feel free to contact us  with any questions or concerns. Okay to discharge from a neuro standpoint. ______________________________________________________________________   Signed, Bruce Priestly, NP [facility]   NEUROHOSPITALIST ADDENDUM Performed a face to face diagnostic evaluation.   I have reviewed the contents of history and physical exam as documented by PA/ARNP/Resident and agree with above documentation.  I have discussed and formulated the above plan as documented. Edits to the note have been made as needed.  Bruce Roan, MD [facility] [phone]   If 7pm to 7am, please call on call as listed on AMION.

## 2024-03-15 NOTE — Inpatient Diabetes Management (Signed)
 Inpatient Diabetes Program Recommendations  AACE/ADA: New Consensus Statement on Inpatient Glycemic Control (2015)  Target Ranges:  Prepandial:   less than 140 mg/dL      Peak postprandial:   less than 180 mg/dL (1-2 hours)      Critically ill patients:  140 - 180 mg/dL   Lab Results  Component Value Date   GLUCAP 265 (H) 03/14/2024   HGBA1C 5.1 09/18/2023    Review of Glycemic Control  Latest Reference Range & Units 03/14/24 11:26 03/14/24 15:02 03/14/24 17:03  Glucose-Capillary 70 - 99 mg/dL 478 (H) 295 (H) 621 (H)   Diabetes history: DM 2 Outpatient Diabetes medications:  Lantus  5 units q HS Current orders for Inpatient glycemic control:  None Inpatient Diabetes Program Recommendations:    May consider adding Novolog  0-6 units tid with meals and HS.   Thanks,  Josefa Ni, RN, BC-ADM Inpatient Diabetes Coordinator Pager [phone]  (8a-5p)

## 2024-03-16 DIAGNOSIS — R404 Transient alteration of awareness: Secondary | ICD-10-CM | POA: Diagnosis not present

## 2024-03-16 LAB — COMPREHENSIVE METABOLIC PANEL WITH GFR
ALT: 35 U/L (ref 0–44)
AST: 21 U/L (ref 15–41)
Albumin: 1.9 g/dL — ABNORMAL LOW (ref 3.5–5.0)
Alkaline Phosphatase: 100 U/L (ref 38–126)
Anion gap: 15 (ref 5–15)
BUN: 37 mg/dL — ABNORMAL HIGH (ref 6–20)
CO2: 20 mmol/L — ABNORMAL LOW (ref 22–32)
Calcium: 8 mg/dL — ABNORMAL LOW (ref 8.9–10.3)
Chloride: 97 mmol/L — ABNORMAL LOW (ref 98–111)
Creatinine, Ser: 3.95 mg/dL — ABNORMAL HIGH (ref 0.61–1.24)
GFR, Estimated: 18 mL/min — ABNORMAL LOW (ref >60)
Glucose, Bld: 235 mg/dL — ABNORMAL HIGH (ref 70–99)
Potassium: 4.2 mmol/L (ref 3.5–5.1)
Sodium: 132 mmol/L — ABNORMAL LOW (ref 135–145)
Total Bilirubin: 0.6 mg/dL (ref 0.0–1.2)
Total Protein: 6.2 g/dL — ABNORMAL LOW (ref 6.5–8.1)

## 2024-03-16 LAB — CBC
HCT: 30.5 % — ABNORMAL LOW (ref 39.0–52.0)
Hemoglobin: 9 g/dL — ABNORMAL LOW (ref 13.0–17.0)
MCH: 27.2 pg (ref 26.0–34.0)
MCHC: 29.5 g/dL — ABNORMAL LOW (ref 30.0–36.0)
MCV: 92.1 fL (ref 80.0–100.0)
Platelets: 135 10*3/uL — ABNORMAL LOW (ref 150–400)
RBC: 3.31 MIL/uL — ABNORMAL LOW (ref 4.22–5.81)
RDW: 19 % — ABNORMAL HIGH (ref 11.5–15.5)
WBC: 10.7 10*3/uL — ABNORMAL HIGH (ref 4.0–10.5)
nRBC: 0 % (ref 0.0–0.2)

## 2024-03-16 LAB — PHOSPHORUS: Phosphorus: 6.9 mg/dL — ABNORMAL HIGH (ref 2.5–4.6)

## 2024-03-16 LAB — GLUCOSE, CAPILLARY
Glucose-Capillary: 231 mg/dL — ABNORMAL HIGH (ref 70–99)
Glucose-Capillary: 181 mg/dL — ABNORMAL HIGH (ref 70–99)

## 2024-03-16 LAB — MAGNESIUM: Magnesium: 1.8 mg/dL (ref 1.7–2.4)

## 2024-03-16 MED ORDER — HEPARIN SODIUM (PORCINE) 1000 UNIT/ML IJ SOLN
INTRAMUSCULAR | Status: AC
  Filled 2024-03-16: qty 1

## 2024-03-16 MED ORDER — FLUCONAZOLE 100 MG PO TABS
100.0000 mg | ORAL_TABLET | Freq: Every day | ORAL | Status: DC
  Administered 2024-03-16 – 2024-03-17 (×2): 100 mg via ORAL
  Filled 2024-03-16 (×2): qty 1

## 2024-03-16 MED ORDER — ACETAMINOPHEN 325 MG PO TABS
ORAL_TABLET | ORAL | Status: AC
  Filled 2024-03-16: qty 2

## 2024-03-16 NOTE — Progress Notes (Signed)
 PROGRESS NOTE  Bruce Little ZOX:[id number] DOB: November 18, 1970 DOA: 03/13/2024 PCP: Freya Jesus, MD   LOS: 2 days   Brief Narrative / Interim history: 53 year old male with history of prior ICH and now essentially bedbound with movement of the right upper extremity only, ESRD on TTS HD, recent brachiocephalic fistula in the right upper arm March 2025, history of seizures on Keppra  comes into the hospital with slurred speech.  There was also concern in the ER that patient may have had a seizure, staring off and making repetitive motions with the right arm l ike picking something up in the air.  Neurology was consulted and he was admitted to the hospital.  Urinalysis on admission was suggestive of a UTI.  Subjective / 24h Interval events: Feels a little bit sleepy this morning, but wakes up and has no complaints.  Denies any pain, denies any shortness of breath  Assesement and Plan: Principal Problem:   AMS (altered mental status) Active Problems:   ESRD (end stage renal disease) on dialysis (HCC)   DM2 (diabetes mellitus, type 2) (HCC)   HTN (hypertension)   Paroxysmal A-fib (HCC) - Cannot be on systemic anticoagulants due to prior hemorrhagic CVA in June 2024.   S/P BKA (below knee amputation) unilateral, left (HCC)   History of hemorrhagic stroke with residual hemiparesis (HCC)   Suprapubic catheter (HCC) - placed 01-03-2024.   Seizure (HCC)   Pressure injury of skin   Principal problem Acute metabolic encephalopathy -possibly due to UTI, has been started on Zosyn  initially, cultures with yeast, transition to Diflucan and discontinue Zosyn .  Seems alert and oriented x 4 for me this morning  Active problems Concern for seizures -has received Keppra  loading dose in the ER, apparently possibly had a seizure episode on admission.  Neurology consulted, underwent LTM EEG, no evidence of seizures, neurology signed off 5/7  History of PAF-continue amiodarone , Coreg .  He is not on  anticoagulation following his ICH in 2024  ESRD on HD-nephrology following, continue dialysis while here  Essential hypertension-continue Cozaar , Lasix   History of ICH-stable, control blood pressure  Chronic urinary retention-with suprapubic catheter  UTI-due to chronic catheter  Anemia-of chronic illness, chronic renal disease.  Hemoglobin stable, no bleeding  Thrombocytopenia-platelets stable, no bleeding  Hyponatremia-fluid management with HD  Decubitus ulcer, POA - noted, local wound care   Pressure Injury 03/14/24 Sacrum Stage 3 -  Full thickness tissue loss. Subcutaneous fat may be visible but bone, tendon or muscle are NOT exposed. (Active)  03/14/24 0148  Location: Sacrum  Location Orientation:   Staging: Stage 3 -  Full thickness tissue loss. Subcutaneous fat may be visible but bone, tendon or muscle are NOT exposed.  Wound Description (Comments):   Present on Admission: Yes     Pressure Injury 03/14/24 Buttocks Left Unstageable - Full thickness tissue loss in which the base of the injury is covered by slough (yellow, tan, gray, green or brown) and/or eschar (tan, brown or black) in the wound bed. (Active)  03/14/24 0148  Location: Buttocks  Location Orientation: Left  Staging: Unstageable - Full thickness tissue loss in which the base of the injury is covered by slough (yellow, tan, gray, green or brown) and/or eschar (tan, brown or black) in the wound bed.  Wound Description (Comments):   Present on Admission: Yes     Pressure Injury 03/14/24 Heel Distal;Posterior;Right Unstageable - Full thickness tissue loss in which the base of the injury is covered by slough (yellow, tan, gray,  green or brown) and/or eschar (tan, brown or black) in the wound bed. (Active)  03/14/24 0148  Location: Heel  Location Orientation: Distal;Posterior;Right  Staging: Unstageable - Full thickness tissue loss in which the base of the injury is covered by slough (yellow, tan, gray, green or  brown) and/or eschar (tan, brown or black) in the wound bed.  Wound Description (Comments):   Present on Admission: Yes     Scheduled Meds:  amiodarone   100 mg Oral QPM   amLODipine   10 mg Oral QPM   atorvastatin   40 mg Oral QHS   baclofen  10 mg Oral QHS   carvedilol   25 mg Oral BID WC   Chlorhexidine  Gluconate Cloth  6 each Topical Q0600   collagenase  1 Application Topical Daily   darbepoetin (ARANESP ) injection - DIALYSIS  100 mcg Subcutaneous Q Wed-1800   finasteride   5 mg Oral Daily   fluconazole  100 mg Oral Daily   levETIRAcetam   750 mg Oral BID   liver oil-zinc  oxide   Topical BID   losartan   50 mg Oral Daily   mupirocin  ointment  1 Application Nasal BID   mupirocin  ointment  1 Application Topical Daily   pantoprazole   40 mg Oral Daily   PARoxetine   10 mg Oral Daily   sodium chloride  flush  3 mL Intravenous Q12H   sodium chloride  flush  3-10 mL Intravenous Q12H   sodium hypochlorite  1 Application Irrigation Daily   terazosin   1 mg Oral QHS   Continuous Infusions:   PRN Meds:.acetaminophen  **OR** acetaminophen , albuterol , hydrALAZINE , methocarbamol , promethazine , sodium chloride  flush  Current Outpatient Medications  Medication Instructions   acetaminophen  (TYLENOL ) 1,000 mg, Every 6 hours PRN   albuterol  (VENTOLIN  HFA) 108 (90 Base) MCG/ACT inhaler 1 puff, Inhalation, Every 6 hours PRN   amiodarone  (PACERONE ) 100 mg, Every evening   amLODipine  (NORVASC ) 10 mg, Every evening   atorvastatin  (LIPITOR ) 40 mg, Daily at bedtime   baclofen (LIORESAL) 10 mg, Oral, 2 times daily   carvedilol  (COREG ) 25 mg, Oral, 2 times daily with meals   cloNIDine  (CATAPRES ) 0.1 mg, Oral, Daily   collagenase (SANTYL) 250 UNIT/GM ointment 1 Application, Topical, Daily   DAKINS EX 1 Application, Apply externally, Daily   diphenhydramine -acetaminophen  (TYLENOL  PM) 25-500 MG TABS tablet 2 tablets, At bedtime PRN   finasteride  (PROSCAR ) 5 mg, Daily   furosemide  (LASIX ) 40 mg, Daily    hydrALAZINE  (APRESOLINE ) 25 mg, 3 times daily   Lantus  SoloStar 5 Units, Subcutaneous, Daily   levETIRAcetam  (KEPPRA ) 1,500 mg, 2 times daily   losartan  (COZAAR ) 50 mg, Oral, Daily   melatonin 5-10 mg, At bedtime PRN   methocarbamol  (ROBAXIN ) 500 mg, Oral, Every 8 hours PRN   Multiple Vitamins-Minerals (EQ MULTIVITAMINS ADULT GUMMY PO) 1 tablet, Daily   multivitamin (RENA-VIT) TABS tablet 1 tablet, Oral, Daily at bedtime   mupirocin  ointment (BACTROBAN ) 2 % 1 Application, 2 times daily   ondansetron  (ZOFRAN -ODT) 4 mg, Every 8 hours PRN   pantoprazole  (PROTONIX ) 40 mg, Oral, Daily   PARoxetine  (PAXIL ) 10 mg, Daily   polyethylene glycol (MIRALAX  / GLYCOLAX ) 17 g, Oral, 2 times daily   promethazine  (PHENERGAN ) 12.5 mg, Oral, Every 6 hours PRN   promethazine  (PHENERGAN ) 25 mg, Rectal, Every 6 hours PRN   senna-docusate (SENOKOT-S) 8.6-50 MG tablet 2 tablets, Oral, 2 times daily   terazosin  (HYTRIN ) 1 mg, Daily at bedtime   traZODone  (DESYREL ) 100 mg, Oral, At bedtime PRN   vancomycin  (VANCOCIN ) 125  mg, Oral, 2 times daily    Diet Orders (From admission, onward)     Start     Ordered   03/13/24 1911  Diet renal with fluid restriction Fluid restriction: 1200 mL Fluid; Room service appropriate? Yes with Assist; Fluid consistency: Thin  Diet effective now       Question Answer Comment  Fluid restriction: 1200 mL Fluid   Room service appropriate? Yes with Assist   Fluid consistency: Thin      03/13/24 1911            DVT prophylaxis: Place and maintain sequential compression device Start: 03/14/24 1656   Lab Results  Component Value Date   PLT 135 (L) 03/16/2024      Code Status: Full Code  Family Communication: no family at bedside   Status is: Inpatient Remains inpatient appropriate because: severity of illness  Level of care: Telemetry Medical  Consultants:  Neurology   Objective: Vitals:   03/15/24 2128 03/16/24 0500 03/16/24 0525 03/16/24 0744  BP: 129/69   122/70 127/68  Pulse:   92 78  Resp:   18 15  Temp:   98.7 F (37.1 C) 98.8 F (37.1 C)  TempSrc:   Oral Oral  SpO2:   98% 97%  Weight:  71.2 kg    Height:        Intake/Output Summary (Last 24 hours) at 03/16/2024 1004 Last data filed at 03/16/2024 8295 Gross per 24 hour  Intake 203 ml  Output 600 ml  Net -397 ml   Wt Readings from Last 3 Encounters:  03/16/24 71.2 kg  02/21/24 71.4 kg  01/18/24 77.1 kg    Examination:  Constitutional: NAD Eyes: lids and conjunctivae normal, no scleral icterus ENMT: mmm Neck: normal, supple Respiratory: clear to auscultation bilaterally, no wheezing, no crackles Cardiovascular: Regular rate and rhythm, no murmurs / rubs / gallops. Abdomen: soft, no distention, no tenderness. Bowel sounds positive.   Data Reviewed: I have independently reviewed following labs and imaging studies   CBC Recent Labs  Lab 03/13/24 1323 03/14/24 0448 03/15/24 0456 03/16/24 0545  WBC 14.1* 12.6* 11.0* 10.7*  HGB 10.1* 10.0* 9.2* 9.0*  HCT 34.2* 32.9* 30.7* 30.5*  PLT 177 147* 116* 135*  MCV 93.4 90.6 91.4 92.1  MCH 27.6 27.5 27.4 27.2  MCHC 29.5* 30.4 30.0 29.5*  RDW 19.7* 19.5* 19.1* 19.0*  LYMPHSABS  --   --  1.7  --   MONOABS  --   --  1.3*  --   EOSABS  --   --  0.3  --   BASOSABS  --   --  0.1  --     Recent Labs  Lab 03/13/24 1323 03/14/24 0448 03/15/24 0456 03/16/24 0545  NA 136 133* 133* 132*  K 5.3* 4.0 4.3 4.2  CL 99 98 98 97*  CO2 25 25 23  20*  GLUCOSE 138* 95 264* 235*  BUN 37* 15 30* 37*  CREATININE 3.44* 1.94* 3.04* 3.95*  CALCIUM  8.9 8.4* 8.2* 8.0*  AST 35 36  --  21  ALT 51* 48*  --  35  ALKPHOS 136* 119  --  100  BILITOT 0.7 0.5  --  0.6  ALBUMIN 2.0* 2.0* 1.9* 1.9*  MG  --   --   --  1.8  AMMONIA 34  --   --   --     ------------------------------------------------------------------------------------------------------------------ No results for input(s): "CHOL", "HDL", "LDLCALC", "TRIG", "CHOLHDL", "LDLDIRECT"  in the last 72  hours.  Lab Results  Component Value Date   HGBA1C 5.1 09/18/2023   ------------------------------------------------------------------------------------------------------------------ No results for input(s): "TSH", "T4TOTAL", "T3FREE", "THYROIDAB" in the last 72 hours.  Invalid input(s): "FREET3"  Cardiac Enzymes No results for input(s): "CKMB", "TROPONINI", "MYOGLOBIN" in the last 168 hours.  Invalid input(s): "CK" ------------------------------------------------------------------------------------------------------------------ No results found for: "BNP"  CBG: Recent Labs  Lab 03/14/24 1502 03/14/24 1703 03/15/24 2047 03/16/24 0748 03/16/24 0921  GLUCAP 310* 265* 80 231* 181*    Recent Results (from the past 240 hours)  Urine Culture (for pregnant, neutropenic or urologic patients or patients with an indwelling urinary catheter)     Status: Abnormal   Collection Time: 03/13/24  1:23 PM   Specimen: Urine, Clean Catch  Result Value Ref Range Status   Specimen Description URINE, CLEAN CATCH  Final   Special Requests   Final    NONE Performed at [facility], [address], [place], [place] [address]    Culture >=100,000 COLONIES/mL YEAST (A)  Final   Report Status 03/15/2024 FINAL  Final  MRSA Next Gen by PCR, Nasal     Status: Abnormal   Collection Time: 03/14/24  1:53 AM   Specimen: Nasal Mucosa; Nasal Swab  Result Value Ref Range Status   MRSA by PCR Next Gen DETECTED (A) NOT DETECTED Final    Comment: RESULT CALLED TO, READ BACK BY AND VERIFIED WITH: E CASTRO RN 03/14/2024 @ 0315 BY AB (NOTE) The GeneXpert MRSA Assay (FDA approved for NASAL specimens only), is one component of a comprehensive MRSA colonization surveillance program. It is not intended to diagnose MRSA infection nor to guide or monitor treatment for MRSA infections. Test performance is not FDA approved in patients less than 7 years old. Performed at [facility],  [address], [place], [place] [address]   Culture, blood (Routine X 2) w Reflex to ID Panel     Status: None (Preliminary result)   Collection Time: 03/14/24  6:31 PM   Specimen: BLOOD  Result Value Ref Range Status   Specimen Description BLOOD SITE NOT SPECIFIED  Final   Special Requests   Final    BOTTLES DRAWN AEROBIC AND ANAEROBIC Blood Culture results may not be optimal due to an inadequate volume of blood received in culture bottles   Culture   Final    NO GROWTH < 12 HOURS Performed at [facility], [address], [place], [place] [address]    Report Status PENDING  Incomplete     Radiology Studies: IR GASTROSTOMY TUBE REMOVAL/REPAIR Result Date: 03/15/2024 INDICATION: Patient with history of prior ICH and now essentially bedbound with movement of the right upper extremity only, ESRD on TTS HD. He was admitted for seizure concerns. While here, admitting team learned that he has not been needing the G-tube placed at another facility after his ICH and requested removal. Tract is well established. Initially placed in [place] , has since been exchanged to current balloon retention. EXAM: Gastrostomy tube removal MEDICATIONS: None ANESTHESIA/SEDATION: None CONTRAST:  None FLUOROSCOPY: None COMPLICATIONS: None immediate. PROCEDURE: Informed written consent was obtained from the patient's wife, Ld Eplin after a thorough discussion of the procedural risks, benefits and alternatives. All questions were addressed. Balloon retention deflated. Gastrostomy tube removed in its entirety without immediate complication. No immediate drainage from stoma. Dry gauze dressing placed. IMPRESSION: Successful removal of gastrostomy tube from established tract. Recommendations for oral intake post-procedure provided to RN and patient's wife. Performed at bedside  by Terressa Fess, NP Electronically Signed   By: Myrlene Asper D.O.   On: 03/15/2024 17:05     Kathlen Para, MD, PhD [facility]  Between 7 am - 7 pm I am available, please contact me via Amion (for emergencies) or Securechat (non urgent messages)  Between 7 pm - 7 am I am not available, please contact night coverage MD/APP via Amion

## 2024-03-16 NOTE — Progress Notes (Signed)
 [facility] Progress Note   Subjective:    Seen and examined patient at bedside. He's sleeping but arouses to voice. Denies SOB and CP. Appears diaphorectic though and requested bedside RN to obtain a blood sugar. Plan for HD this afternoon. S/p g-tube removal by IR yesterday. Appears he's tolerating PO.  Objective Vitals:   03/15/24 2128 03/16/24 0500 03/16/24 0525 03/16/24 0744  BP: 129/69  122/70 127/68  Pulse:   92 78  Resp:   18 15  Temp:   98.7 F (37.1 C) 98.8 F (37.1 C)  TempSrc:   Oral Oral  SpO2:   98% 97%  Weight:  71.2 kg    Height:       Physical Exam General: Alert; awake, NAD, on RA Heart: S1 and S2; No murmurs, gallops, and rubs Lungs: Clear anteriorly Abdomen: Soft and non-tender Extremities: No LE edema Dialysis Access: [facility]   Filed Weights   03/13/24 1250 03/16/24 0500  Weight: 71.2 kg 71.2 kg    Intake/Output Summary (Last 24 hours) at 03/16/2024 1026 Last data filed at 03/16/2024 4782 Gross per 24 hour  Intake 203 ml  Output 600 ml  Net -397 ml    Additional Objective Labs: Basic Metabolic Panel: Recent Labs  Lab 03/14/24 0448 03/15/24 0456 03/16/24 0545  NA 133* 133* 132*  K 4.0 4.3 4.2  CL 98 98 97*  CO2 25 23 20*  GLUCOSE 95 264* 235*  BUN 15 30* 37*  CREATININE 1.94* 3.04* 3.95*  CALCIUM  8.4* 8.2* 8.0*  PHOS  --  5.1* 6.9*   Liver Function Tests: Recent Labs  Lab 03/13/24 1323 03/14/24 0448 03/15/24 0456 03/16/24 0545  AST 35 36  --  21  ALT 51* 48*  --  35  ALKPHOS 136* 119  --  100  BILITOT 0.7 0.5  --  0.6  PROT 6.7 6.7  --  6.2*  ALBUMIN 2.0* 2.0* 1.9* 1.9*   No results for input(s): "LIPASE", "AMYLASE" in the last 168 hours. CBC: Recent Labs  Lab 03/13/24 1323 03/14/24 0448 03/15/24 0456 03/16/24 0545  WBC 14.1* 12.6* 11.0* 10.7*  NEUTROABS  --   --  7.7  --   HGB 10.1* 10.0* 9.2* 9.0*  HCT 34.2* 32.9* 30.7* 30.5*  MCV 93.4 90.6 91.4 92.1  PLT 177 147* 116* 135*   Blood Culture     Component Value Date/Time   SDES BLOOD SITE NOT SPECIFIED 03/14/2024 1831   SPECREQUEST  03/14/2024 1831    BOTTLES DRAWN AEROBIC AND ANAEROBIC Blood Culture results may not be optimal due to an inadequate volume of blood received in culture bottles   CULT  03/14/2024 1831    NO GROWTH < 12 HOURS Performed at [facility], [address], [place], [place] [address]    REPTSTATUS PENDING 03/14/2024 1831    Cardiac Enzymes: No results for input(s): "CKTOTAL", "CKMB", "CKMBINDEX", "TROPONINI" in the last 168 hours. CBG: Recent Labs  Lab 03/14/24 1502 03/14/24 1703 03/15/24 2047 03/16/24 0748 03/16/24 0921  GLUCAP 310* 265* 80 231* 181*   Iron Studies: No results for input(s): "IRON", "TIBC", "TRANSFERRIN", "FERRITIN" in the last 72 hours. Lab Results  Component Value Date   INR 1.1 09/18/2023   INR 1.5 (H) 07/01/2021   INR 1.0 06/04/2021   Studies/Results: IR GASTROSTOMY TUBE REMOVAL/REPAIR Result Date: 03/15/2024 INDICATION: Patient with history of prior ICH and now essentially bedbound with movement of the right upper extremity only, ESRD on TTS HD. He  was admitted for seizure concerns. While here, admitting team learned that he has not been needing the G-tube placed at another facility after his ICH and requested removal. Tract is well established. Initially placed in [place] , has since been exchanged to current balloon retention. EXAM: Gastrostomy tube removal MEDICATIONS: None ANESTHESIA/SEDATION: None CONTRAST:  None FLUOROSCOPY: None COMPLICATIONS: None immediate. PROCEDURE: Informed written consent was obtained from the patient's wife, Riad Slemmer after a thorough discussion of the procedural risks, benefits and alternatives. All questions were addressed. Balloon retention deflated. Gastrostomy tube removed in its entirety without immediate complication. No immediate drainage from stoma. Dry gauze dressing placed. IMPRESSION: Successful removal of gastrostomy tube from  established tract. Recommendations for oral intake post-procedure provided to RN and patient's wife. Performed at bedside by Terressa Fess, NP Electronically Signed   By: Myrlene Asper D.O.   On: 03/15/2024 17:05    Medications:   amiodarone   100 mg Oral QPM   amLODipine   10 mg Oral QPM   atorvastatin   40 mg Oral QHS   baclofen  10 mg Oral QHS   carvedilol   25 mg Oral BID WC   Chlorhexidine  Gluconate Cloth  6 each Topical Q0600   collagenase  1 Application Topical Daily   darbepoetin (ARANESP ) injection - DIALYSIS  100 mcg Subcutaneous Q Wed-1800   finasteride   5 mg Oral Daily   fluconazole  100 mg Oral Daily   levETIRAcetam   750 mg Oral BID   liver oil-zinc  oxide   Topical BID   losartan   50 mg Oral Daily   mupirocin  ointment  1 Application Nasal BID   mupirocin  ointment  1 Application Topical Daily   pantoprazole   40 mg Oral Daily   PARoxetine   10 mg Oral Daily   sodium chloride  flush  3 mL Intravenous Q12H   sodium chloride  flush  3-10 mL Intravenous Q12H   sodium hypochlorite  1 Application Irrigation Daily   terazosin   1 mg Oral QHS    Dialysis Orders: TTS - [facility] 4 hrs. F160. EDW 75kg. RIJ TDC. Flow rates: 500/autoflow 1.5. 3K, 2.5Cal.  Heparin  No bolus, only hep locks Mircera 200 mcg q2wks - last 03/02/24 *Vanc 715mcg+cefepime 2g (until 5/17 and 5/13 respectively, abx for sacral OM)   Assessment/Plan: AMS/Concern for seizures - Received Keppra  load at admit. EEG (-) for seizures. Neurology signed off 5/7. Concern for UTI - Cultures (+) yeast. Zosyn  stopped and now on Diflucan ESRD - on HD TTS. Received HD overnight. Didn't reach max UF 2nd hypotension. Next HD this afternoon Hypertension/volume  - Euvolemic on exam. Bps variable: soft/stable. Off Lasix . Continue Carvedilol  and Losartan  for now and will adjust further if indicated Anemia of CKD - Hgb 9.2, will start ESA here Secondary Hyperparathyroidism -  Ca on lower side and phos okay.  Monitor for now. PAF: On Amiodarone  and Coreg  Hx ICH: Per Primary Chronic urinary retention: with suprapubic catheter Nutrition - Renal diet with fluid restriction  Jadene Maxwell, NP [facility] 03/16/2024,10:26 AM  LOS: 2 days

## 2024-03-16 NOTE — Plan of Care (Signed)
 Problem: Education: Goal: Knowledge of General Education information will improve Description: Including pain rating scale, medication(s)/side effects and non-pharmacologic comfort measures Outcome: Progressing   Problem: Health Behavior/Discharge Planning: Goal: Ability to manage health-related needs will improve Outcome: Progressing   Problem: Clinical Measurements: Goal: Ability to maintain clinical measurements within normal limits will improve Outcome: Progressing Goal: Will remain free from infection Outcome: Progressing Goal: Respiratory complications will improve Outcome: Progressing Goal: Cardiovascular complication will be avoided Outcome: Progressing   Problem: Coping: Goal: Level of anxiety will decrease Outcome: Progressing   Problem: Elimination: Goal: Will not experience complications related to bowel motility Outcome: Progressing   Problem: Safety: Goal: Ability to remain free from injury will improve Outcome: Progressing   Problem: Skin Integrity: Goal: Risk for impaired skin integrity will decrease Outcome: Progressing   Problem: Activity: Goal: Risk for activity intolerance will decrease Outcome: Not Progressing   Problem: Nutrition: Goal: Adequate nutrition will be maintained Outcome: Not Progressing   Problem: Pain Managment: Goal: General experience of comfort will improve and/or be controlled Outcome: Not Progressing

## 2024-03-16 NOTE — Progress Notes (Signed)
   03/16/24 1847  Vitals  Temp (!) 97.4 F (36.3 C)  Pulse Rate 79  Resp 16  BP 137/69  SpO2 100 %  O2 Device Room Air  Weight 84 kg  Type of Weight Post-Dialysis  Post Treatment  Dialyzer Clearance Lightly streaked  Hemodialysis Intake (mL) 0 mL  Liters Processed 84  Fluid Removed (mL) 1000 mL  Tolerated HD Treatment Yes   Received patient in bed to unit.  Alert and oriented.  Informed consent signed and in chart.   TX duration: Three hours and thirty minutes  Patient tolerated well.  Transported back to the room  Alert, without acute distress.  Hand-off given to patient's nurse.   Access used: Right HD Chest Catheter Access issues: None  Medication(s) given: acetaminophen  750mg .

## 2024-03-17 ENCOUNTER — Other Ambulatory Visit (HOSPITAL_COMMUNITY): Payer: Self-pay

## 2024-03-17 DIAGNOSIS — R404 Transient alteration of awareness: Secondary | ICD-10-CM | POA: Diagnosis not present

## 2024-03-17 LAB — GLUCOSE, CAPILLARY: Glucose-Capillary: 330 mg/dL — ABNORMAL HIGH (ref 70–99)

## 2024-03-17 MED ORDER — FLUCONAZOLE 100 MG PO TABS
100.0000 mg | ORAL_TABLET | Freq: Every day | ORAL | 0 refills | Status: AC
  Filled 2024-03-17: qty 6, 6d supply, fill #0

## 2024-03-17 NOTE — Inpatient Diabetes Management (Signed)
 Inpatient Diabetes Program Recommendations  AACE/ADA: New Consensus Statement on Inpatient Glycemic Control (2015)  Target Ranges:  Prepandial:   less than 140 mg/dL      Peak postprandial:   less than 180 mg/dL (1-2 hours)      Critically ill patients:  140 - 180 mg/dL   Lab Results  Component Value Date   GLUCAP 330 (H) 03/17/2024   HGBA1C 5.1 09/18/2023    Review of Glycemic Control  Latest Reference Range & Units 03/15/24 20:47 03/16/24 07:48 03/16/24 09:21 03/17/24 05:47  Glucose-Capillary 70 - 99 mg/dL 80 098 (H) 119 (H) 147 (H)   Diabetes history: DM 2 Outpatient Diabetes medications:  Lantus  5 units q HS Current orders for Inpatient glycemic control:  None Inpatient Diabetes Program Recommendations:    May consider adding Novolog  0-6 units tid with meals and HS.   Thanks,  Eloise Hake RN, MSN, BC-ADM Inpatient Diabetes Coordinator Team Pager [phone] (8a-5p)

## 2024-03-17 NOTE — TOC Transition Note (Signed)
 Transition of Care [facility]) - Discharge Note   Patient Details  Name: Bruce Little MRN: [id number] Date of Birth: Mar 21, 1971  Transition of Care [facility]) CM/SW Contact:  Tom-Johnson, Loui Massenburg Daphne, RN Phone Number: 03/17/2024, 12:13 PM   Clinical Narrative:     Patient is scheduled for discharge today.  Readmission Risk Assessment done. Home health info, hospital f/u and discharge instructions on AVS. Prescriptions sent to [facility] pharmacy and patient will receive meds prior discharge. Wife, Bruce Little to transport at discharge.  No further TOC needs noted.        Final next level of care: Home w Home Health Services Barriers to Discharge: Barriers Resolved   Patient Goals and CMS Choice Patient states their goals for this hospitalization and ongoing recovery are:: To return home CMS Medicare.gov Compare Post Acute Care list provided to:: Patient Choice offered to / list presented to : Patient, Spouse      Discharge Placement                       Discharge Plan and Services Additional resources added to the After Visit Summary for                  DME Arranged: N/A DME Agency: NA       HH Arranged: PT, RN (Resumption of care)          Social Drivers of Health (SDOH) Interventions SDOH Screenings   Food Insecurity: No Food Insecurity (03/14/2024)  Housing: Low Risk  (03/14/2024)  Recent Concern: Housing - High Risk (01/05/2024)  Transportation Needs: No Transportation Needs (03/14/2024)  Utilities: Not At Risk (03/14/2024)  Depression (PHQ2-9): Low Risk  (08/27/2021)  Financial Resource Strain: Low Risk  (08/10/2023)   Received from [facility]  Social Connections: Unknown (08/13/2023)   Received from [facility]  Stress: No Stress Concern Present (08/10/2023)   Received from [facility]  Tobacco Use: Low Risk  (03/13/2024)     Readmission Risk Interventions    03/17/2024   12:12 PM 09/22/2023   11:18 AM 07/11/2021    3:04 PM  Readmission Risk Prevention  Plan  Transportation Screening Complete Complete Complete  PCP or Specialist Appt within 5-7 Days  Complete Complete  Home Care Screening  Complete Complete  Medication Review (RN CM)  Referral to Pharmacy Complete  Medication Review (RN Care Manager) Referral to Pharmacy    PCP or Specialist appointment within 3-5 days of discharge Complete    HRI or Home Care Consult Complete    SW Recovery Care/Counseling Consult Complete    Palliative Care Screening Not Applicable    Skilled Nursing Facility Not Applicable

## 2024-03-17 NOTE — Plan of Care (Signed)
  Problem: Education: Goal: Knowledge of General Education information will improve Description: Including pain rating scale, medication(s)/side effects and non-pharmacologic comfort measures Outcome: Adequate for Discharge   Problem: Health Behavior/Discharge Planning: Goal: Ability to manage health-related needs will improve Outcome: Adequate for Discharge   Problem: Clinical Measurements: Goal: Ability to maintain clinical measurements within normal limits will improve Outcome: Adequate for Discharge Goal: Will remain free from infection Outcome: Adequate for Discharge Goal: Diagnostic test results will improve Outcome: Adequate for Discharge Goal: Respiratory complications will improve Outcome: Adequate for Discharge Goal: Cardiovascular complication will be avoided Outcome: Adequate for Discharge   Problem: Activity: Goal: Risk for activity intolerance will decrease Outcome: Adequate for Discharge   Problem: Nutrition: Goal: Adequate nutrition will be maintained Outcome: Adequate for Discharge   Problem: Coping: Goal: Level of anxiety will decrease Outcome: Adequate for Discharge   Problem: Elimination: Goal: Will not experience complications related to bowel motility Outcome: Adequate for Discharge Goal: Will not experience complications related to urinary retention Outcome: Adequate for Discharge   Problem: Pain Managment: Goal: General experience of comfort will improve and/or be controlled Outcome: Adequate for Discharge   Problem: Safety: Goal: Ability to remain free from injury will improve Outcome: Adequate for Discharge   Problem: Skin Integrity: Goal: Risk for impaired skin integrity will decrease Outcome: Adequate for Discharge   Problem: Acute Rehab PT Goals(only PT should resolve) Goal: PT Additional Goal #1 Outcome: Adequate for Discharge   Problem: Acute Rehab OT Goals (only OT should resolve) Goal: Pt. Will Perform Eating Outcome: Adequate  for Discharge Goal: Pt. Will Perform Grooming Outcome: Adequate for Discharge Goal: OT Additional ADL Goal #1 Outcome: Adequate for Discharge Goal: OT Additional ADL Goal #2 Outcome: Adequate for Discharge Goal: OT Additional ADL Goal #3 Outcome: Adequate for Discharge

## 2024-03-17 NOTE — Discharge Planning (Signed)
 [facility] Patient Discharge Orders- [facility] CLINIC: [facility]  Patient's name: Bruce Little Admit/DC Dates: 03/13/2024 - 03/17/2024  Discharge Diagnoses: Acute metabolic encephalopathy - poss 2nd UTI, cultures (+) yeast, on PO anti0fungal, see below. Appears suprapubic cath exchanged today  Concern for seizures - Received Keppra . S/p EEG no evidence of seizures, Neuro signed off 5/7  Aranesp : Given: Yes    Date and amount of last dose: 100mcg on 5/7   Last Hgb: 9.0 PRBC's Given: No  ESA dose for discharge: mircera 200 mcg IV q 2 weeks  IV Iron dose at discharge: N/A  Heparin  change: No  EDW Change: No  Bath Change: No  Access intervention/Change: No  Hectorol /Calcitriol change: N/A  Discharge Labs: Calcium  8.0 Phosphorus 6.9 Albumin 1.9 K+ 4.2  IV Antibiotics: Yes Details: Urinary tract infection (yeast) On PO Diflucan  100mg  daily X 6 days  On Coumadin?: No   OTHER/APPTS/LAB ORDERS: Attn Renal Provider: It was advised by me and Hospiatlist to stop Baclofen  BID which was noted on his home med list. I already spoke to Mrs Doland who confirms she will stop the medication. She inquired an alternative to help with his muscle spasms. I considered low-dose Flexeril at bedtime but I want him to complete his ABXs first and ensure his mental status fully returns to baseline. She is in agreement with this. Mrs. Callas request when we discuss an alternative for muscle spasms that she is present or called.   D/C Meds to be reconciled by nurse after every discharge.  Completed By: Jadene Maxwell, NP   Reviewed by: MD:______ RN_______

## 2024-03-17 NOTE — Discharge Summary (Signed)
 Physician Discharge Summary  Jonathandavid Munkres Maguire UJW:[id number] DOB: 02-11-1971 DOA: 03/13/2024  PCP: Freya Jesus, MD  Admit date: 03/13/2024 Discharge date: 03/17/2024  Admitted From: home Disposition:  home  Recommendations for Outpatient Follow-up:  Follow up with PCP in 1-2 weeks  Home Health: none Equipment/Devices: none  Discharge Condition: stable CODE STATUS: Full code Diet Orders (From admission, onward)     Start     Ordered   03/13/24 1911  Diet renal with fluid restriction Fluid restriction: 1200 mL Fluid; Room service appropriate? Yes with Assist; Fluid consistency: Thin  Diet effective now       Question Answer Comment  Fluid restriction: 1200 mL Fluid   Room service appropriate? Yes with Assist   Fluid consistency: Thin      03/13/24 1911            Brief Narrative / Interim history: 53 year old male with history of prior ICH and now essentially bedbound with movement of the right upper extremity only, ESRD on TTS HD, recent brachiocephalic fistula in the right upper arm March 2025, history of seizures on Keppra  comes into the hospital with slurred speech.  There was also concern in the ER that patient may have had a seizure, staring off and making repetitive motions with the right arm l ike picking something up in the air.  Neurology was consulted and he was admitted to the hospital.  Urinalysis on admission was suggestive of a UTI.  Hospital Course / Discharge diagnoses: Principal Problem:   AMS (altered mental status) Active Problems:   ESRD (end stage renal disease) on dialysis (HCC)   DM2 (diabetes mellitus, type 2) (HCC)   HTN (hypertension)   Paroxysmal A-fib (HCC) - Cannot be on systemic anticoagulants due to prior hemorrhagic CVA in June 2024.   S/P BKA (below knee amputation) unilateral, left (HCC)   History of hemorrhagic stroke with residual hemiparesis (HCC)   Suprapubic catheter (HCC) - placed 01-03-2024.   Seizure (HCC)   Pressure injury  of skin   Principal problem Acute metabolic encephalopathy -possibly due to UTI, has been started on Zosyn  initially, cultures with yeast, transition to Diflucan  and discontinue Zosyn .  Encephalopathy resolved and he is back to baseline   Active problems Concern for seizures -has received Keppra  loading dose in the ER, apparently possibly had a seizure episode on admission.  Neurology consulted, underwent LTM EEG, no evidence of seizures, neurology signed off 5/7.  Continue home Keppra  and treat underlying UTI UTI-continue Diflucan  History of PAF-continue amiodarone , Coreg .  He is not on anticoagulation following his ICH in 2024 ESRD on HD-nephrology following, continue dialysis while here Essential hypertension-continue Cozaar , Lasix  History of ICH-stable, control blood pressure Chronic urinary retention-with suprapubic catheter UTI-due to chronic catheter Anemia-of chronic illness, chronic renal disease.  Hemoglobin stable, no bleeding Thrombocytopenia-platelets stable, no bleeding Hyponatremia-fluid management with HD Decubitus ulcer, POA - noted, local wound care  Pressure Injury 03/14/24 Sacrum Stage 3 -  Full thickness tissue loss. Subcutaneous fat may be visible but bone, tendon or muscle are NOT exposed. (Active)  03/14/24 0148  Location: Sacrum  Location Orientation:   Staging: Stage 3 -  Full thickness tissue loss. Subcutaneous fat may be visible but bone, tendon or muscle are NOT exposed.  Wound Description (Comments):   Present on Admission: Yes     Pressure Injury 03/14/24 Buttocks Left Unstageable - Full thickness tissue loss in which the base of the injury is covered by slough (yellow, tan, gray, green  or brown) and/or eschar (tan, brown or black) in the wound bed. (Active)  03/14/24 0148  Location: Buttocks  Location Orientation: Left  Staging: Unstageable - Full thickness tissue loss in which the base of the injury is covered by slough (yellow, tan, gray, green or  brown) and/or eschar (tan, brown or black) in the wound bed.  Wound Description (Comments):   Present on Admission: Yes     Pressure Injury 03/14/24 Heel Distal;Posterior;Right Unstageable - Full thickness tissue loss in which the base of the injury is covered by slough (yellow, tan, gray, green or brown) and/or eschar (tan, brown or black) in the wound bed. (Active)  03/14/24 0148  Location: Heel  Location Orientation: Distal;Posterior;Right  Staging: Unstageable - Full thickness tissue loss in which the base of the injury is covered by slough (yellow, tan, gray, green or brown) and/or eschar (tan, brown or black) in the wound bed.  Wound Description (Comments):   Present on Admission: Yes    Sepsis ruled out   Discharge Instructions   Allergies as of 03/17/2024   No Known Allergies      Medication List     STOP taking these medications    hydrALAZINE  25 MG tablet Commonly known as: APRESOLINE    vancomycin  125 MG capsule Commonly known as: VANCOCIN        TAKE these medications    acetaminophen  500 MG tablet Commonly known as: TYLENOL  Take 1,000 mg by mouth every 6 (six) hours as needed for mild pain (pain score 1-3) or headache.   albuterol  108 (90 Base) MCG/ACT inhaler Commonly known as: VENTOLIN  HFA Inhale 1 puff into the lungs every 6 (six) hours as needed for wheezing or shortness of breath.   amiodarone  100 MG tablet Commonly known as: PACERONE  Take 100 mg by mouth every evening.   amLODipine  10 MG tablet Commonly known as: NORVASC  Take 10 mg by mouth every evening.   atorvastatin  40 MG tablet Commonly known as: LIPITOR  Take 40 mg by mouth at bedtime.   baclofen  20 MG tablet Commonly known as: LIORESAL  Take 10 mg by mouth 2 (two) times daily.   carvedilol  25 MG tablet Commonly known as: COREG  Take 1 tablet (25 mg total) by mouth 2 (two) times daily with a meal.   cloNIDine  0.1 MG tablet Commonly known as: CATAPRES  Take 1 tablet (0.1 mg total)  by mouth daily.   DAKINS EX Apply 1 Application topically daily.   diphenhydramine -acetaminophen  25-500 MG Tabs tablet Commonly known as: TYLENOL  PM Take 2 tablets by mouth at bedtime as needed (For sleep).   EQ MULTIVITAMINS ADULT GUMMY PO Take 1 tablet by mouth daily.   finasteride  5 MG tablet Commonly known as: PROSCAR  Take 5 mg by mouth daily.   fluconazole  100 MG tablet Commonly known as: DIFLUCAN  Take 1 tablet (100 mg total) by mouth daily for 6 days. Start taking on: Mar 18, 2024   furosemide  40 MG tablet Commonly known as: LASIX  Take 40 mg by mouth daily.   Lantus  SoloStar 100 UNIT/ML Solostar Pen Generic drug: insulin  glargine Inject 5 Units into the skin daily. What changed: when to take this   levETIRAcetam  750 MG tablet Commonly known as: KEPPRA  Take 1,500 mg by mouth 2 (two) times daily.   losartan  50 MG tablet Commonly known as: COZAAR  Take 1 tablet (50 mg total) by mouth daily.   melatonin 5 MG Tabs Take 5-10 mg by mouth at bedtime as needed (sleep).   methocarbamol  500 MG tablet Commonly  known as: ROBAXIN  Take 1 tablet (500 mg total) by mouth every 8 (eight) hours as needed for muscle spasms. What changed:  when to take this additional instructions   multivitamin Tabs tablet Take 1 tablet by mouth at bedtime.   mupirocin  ointment 2 % Commonly known as: BACTROBAN  Apply 1 Application topically 2 (two) times daily.   ondansetron  4 MG disintegrating tablet Commonly known as: ZOFRAN -ODT Take 4 mg by mouth every 8 (eight) hours as needed for nausea or vomiting.   pantoprazole  40 MG tablet Commonly known as: PROTONIX  Take 1 tablet (40 mg total) by mouth daily.   PARoxetine  10 MG tablet Commonly known as: PAXIL  Take 10 mg by mouth daily.   polyethylene glycol 17 g packet Commonly known as: MIRALAX  / GLYCOLAX  Take 17 g by mouth 2 (two) times daily. What changed:  when to take this reasons to take this   promethazine  12.5 MG  tablet Commonly known as: PHENERGAN  Take 1 tablet (12.5 mg total) by mouth every 6 (six) hours as needed for nausea or vomiting.   promethazine  25 MG suppository Commonly known as: PHENERGAN  Place 1 suppository (25 mg total) rectally every 6 (six) hours as needed for nausea or vomiting.   Santyl  250 UNIT/GM ointment Generic drug: collagenase  Apply 1 Application topically daily.   senna-docusate 8.6-50 MG tablet Commonly known as: Senokot-S Take 2 tablets by mouth 2 (two) times daily. What changed:  when to take this reasons to take this   terazosin  1 MG capsule Commonly known as: HYTRIN  Take 1 mg by mouth at bedtime.   traZODone  100 MG tablet Commonly known as: DESYREL  Take 1 tablet (100 mg total) by mouth at bedtime as needed for sleep.       Consultations: Nephrology Neurology  Procedures/Studies:  IR GASTROSTOMY TUBE REMOVAL/REPAIR Result Date: 03/15/2024 INDICATION: Patient with history of prior ICH and now essentially bedbound with movement of the right upper extremity only, ESRD on TTS HD. He was admitted for seizure concerns. While here, admitting team learned that he has not been needing the G-tube placed at another facility after his ICH and requested removal. Tract is well established. Initially placed in [place] , has since been exchanged to current balloon retention. EXAM: Gastrostomy tube removal MEDICATIONS: None ANESTHESIA/SEDATION: None CONTRAST:  None FLUOROSCOPY: None COMPLICATIONS: None immediate. PROCEDURE: Informed written consent was obtained from the patient's wife, Abiola Faddis after a thorough discussion of the procedural risks, benefits and alternatives. All questions were addressed. Balloon retention deflated. Gastrostomy tube removed in its entirety without immediate complication. No immediate drainage from stoma. Dry gauze dressing placed. IMPRESSION: Successful removal of gastrostomy tube from established tract. Recommendations for oral intake  post-procedure provided to RN and patient's wife. Performed at bedside by Terressa Fess, NP Electronically Signed   By: Myrlene Asper D.O.   On: 03/15/2024 17:05   Overnight EEG with video Result Date: 03/14/2024 Arleene Lack, MD     03/15/2024  9:44 AM Patient Name: Bruce Little MRN: [id number] Epilepsy Attending: Arleene Lack Referring Physician/Provider: Khaliqdina, Salman, MD Duration: 5/6/20525 8295 to 03/15/2024 0343 Patient history: 53yo M presented with concern for not seeing right and some AMS, slurred speech and some epigastric pain/chest pain. EEG to evaluate for seizure Level of alertness: Awake, asleep AEDs during EEG study: LEV Technical aspects: This EEG study was done with scalp electrodes positioned according to the 10-20 International system of electrode placement. Electrical activity was reviewed with band pass filter of 1-70Hz , sensitivity of 7  uV/mm, display speed of 50mm/sec with a 60Hz  notched filter applied as appropriate. EEG data were recorded continuously and digitally stored.  Video monitoring was available and reviewed as appropriate. Description: The posterior dominant rhythm consists of 8-9 Hz activity of moderate voltage (25-35 uV) seen predominantly in posterior head regions, asymmetric ( right<left) and reactive to eye opening and eye closing. EEG also showed continuous high amplitude 3-5hz  theta-delta slowing in right hemisphere admixed with12-14Hz  beta activity in right centro-parietal region consistent with breach artifact. Polyspikes were noted in right centro-parietal region, qasi periodic at 0.25 to 0.5hz . Hyperventilation and photic stimulation were not performed.    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 Additionally there is cortical dysfunction  arising from right centro-parietal region  consistent with underlying craniotomy.  Lastly there is cortical dysfunction in right hemisphere likely secondary to underlying stroke. No seizures were seen throughout the recording.  Arleene Lack   CT Head Wo Contrast Result Date: 03/13/2024 CLINICAL DATA:  Provided history: Neuro deficit, acute, stroke suspected. EXAM: CT HEAD WITHOUT CONTRAST TECHNIQUE: Contiguous axial images were obtained from the base of the skull through the vertex without intravenous contrast. RADIATION DOSE REDUCTION: This exam was performed according to the departmental dose-optimization program which includes automated exposure control, adjustment of the mA and/or kV according to patient size and/or use of iterative reconstruction technique. COMPARISON:  Prior head CT examinations 01/19/2024 and earlier. FINDINGS: Brain: Extensive chronic encephalomalacia/gliosis again demonstrated within the right cerebral hemisphere. The right cerebral hemisphere bulges beyond the right hemicraniectomy defect and there is 5 mm rightward midline shift, similar to the prior head CT of 01/12/2024. Ex vacuo dilatation of the right lateral and third ventricles, also similar to the prior exam. Patchy ill-defined hypoattenuation elsewhere within the cerebral white matter, nonspecific but compatible with chronic small vessel ischemic disease. There is no acute intracranial hemorrhage. No acute demarcated cortical infarct. No extra-axial fluid collection. No evidence of an intracranial mass. Vascular: No hyperdense vessel.  Atherosclerotic calcifications. Skull: Right hemicraniectomy. Burr hole within the midline frontal calvarium. No acute calvarial fracture. Sinuses/Orbits: No orbital mass or acute orbital finding. Mild mucosal thickening within the right frontal and left maxillary sinuses at the imaged levels. IMPRESSION: 1.  No evidence of an acute intracranial abnormality. 2. Extensive chronic  encephalomalacia/gliosis again demonstrated within the right cerebral hemisphere. The right cerebral hemisphere bulges beyond the right hemicraniectomy defect and there is 5 mm rightward midline shift, similar to the prior head CT of 01/12/2024. 3. Background cerebral white matter chronic small vessel ischemic disease. 4. Mild paranasal sinus mucosal thickening at the imaged levels. Electronically Signed   By: Bascom Lily D.O.   On: 03/13/2024 15:51   DG Chest Portable 1 View Result Date: 03/13/2024 CLINICAL DATA:  Chest pain EXAM: PORTABLE CHEST 1 VIEW COMPARISON:  November 01, 2023 FINDINGS: Right IJ dialysis catheter in the cavoatrial junction with bilateral reticular interstitial infiltrates that could correlate with congestive changes without consolidations Heart and mediastinum normal without pleural effusions IMPRESSION: Mild bilateral congestive changes Electronically Signed   By: Fredrich Jefferson M.D.   On: 03/13/2024 14:08   VAS US  DUPLEX DIALYSIS ACCESS (AVF, AVG) Result Date: 03/08/2024 DIALYSIS ACCESS Patient Name:  Svanik Geiser Watling  Date of Exam:   03/08/2024 Medical Rec #: [id number]     Accession #:  [id number] Date of Birth: 1971-07-19     Patient Gender: M Patient Age:   73 years Exam Location:  [facility] Procedure:      VAS US  DUPLEX DIALYSIS ACCESS (AVF, AVG) Referring Phys: Ova Bloomer RHYNE --------------------------------------------------------------------------------  Reason for Exam: Routine follow up. Access Site: Left Upper Extremity. Access Type: Brachial-cephalic AVF. History: S/P LUE AVF 01/18/24. Performing Technologist: Jenifer Miu RVT  Examination Guidelines: A complete evaluation includes B-mode imaging, spectral Doppler, color Doppler, and power Doppler as needed of all accessible portions of each vessel. Unilateral testing is considered an integral part of a complete examination. Limited examinations for reoccurring indications may be performed as noted.  Findings:  +--------------------+----------+-----------------+---------------------+ AVF                 PSV (cm/s)Flow Vol (mL/min)      Comments        +--------------------+----------+-----------------+---------------------+ Native artery inflow   292           955       1216, 903, 748 mL/min +--------------------+----------+-----------------+---------------------+ AVF Anastomosis        277                                           +--------------------+----------+-----------------+---------------------+  +------------+----------+-------------+----------+-----------------------------+ OUTFLOW VEINPSV (cm/s)Diameter (cm)Depth (cm)          Describe            +------------+----------+-------------+----------+-----------------------------+ Confluence     190        0.43        2.11                                 +------------+----------+-------------+----------+-----------------------------+ Shoulder       114        0.54        0.87                                 +------------+----------+-------------+----------+-----------------------------+ Prox UA        127        0.63        0.80                                 +------------+----------+-------------+----------+-----------------------------+ Mid UA         158        0.62        0.42     Branch 0.32 cm / 0.48 cm                                                       depth; 89 mL/min        +------------+----------+-------------+----------+-----------------------------+ Dist UA        118        0.75        0.52                                 +------------+----------+-------------+----------+-----------------------------+ AC Fossa  118        0.77        0.51                                 +------------+----------+-------------+----------+-----------------------------+ Left Radial Artery Wrist: 23 cm/s Antegrade Left Ulnar Artery Wrist: 26 cm/s Antegrade   Summary: - Patent left upper extremity  arterial-venous fistula with averaged flow volume of 955 mL/min. - Small competing branch observed at mid upper arm outflow with flow volume of 89 mL/min. - Left radial and ulnar arteries at the wrist are patent and antegrade.  *See table(s) above for measurements and observations.  Diagnosing physician: Angela Kell MD Electronically signed by Angela Kell MD on 03/08/2024 at 4:16:06 PM.   --------------------------------------------------------------------------------   Final    IR Cystostomy Tube Exchange Simple Result Date: 02/21/2024 INDICATION: NEUROGENIC BLADDER, URINARY RETENTION EXAM: FLUOROSCOPIC SUPRAPUBIC CATHETER EXCHANGE MEDICATIONS: NONE. ANESTHESIA/SEDATION: NONE. COMPLICATIONS: None immediate. PROCEDURE: Informed written consent was obtained from the patient after a thorough discussion of the procedural risks, benefits and alternatives. All questions were addressed. Maximal Sterile Barrier Technique was utilized including caps, mask, sterile gowns, sterile gloves, sterile drape, hand hygiene and skin antiseptic. A timeout was performed prior to the initiation of the procedure. under sterile conditions, the existing 16 french multipurpose drain was removed over an amplatz guidewire from the bladder. over the guidewire, a new 16 french council tip balloon retention catheter was advanced. balloon was inflated with 10 cc saline and retracted against the anterior bladder wall. contrast injection confirms position. images obtained for documentation. sterile dressing applied. gravity drainage bag connected. IMPRESSION: Successful fluoroscopic exchange of the suprapubic catheter for a 16 French balloon tip Council catheter. Electronically Signed   By: Melven Stable.  Shick M.D.   On: 02/21/2024 15:07     Subjective: - no chest pain, shortness of breath, no abdominal pain, nausea or vomiting.   Discharge Exam: BP 132/78 (BP Location: Left Arm)   Pulse (!) 104   Temp 98.4 F (36.9 C) (Oral)   Resp 18   Ht  6\' 2"  (1.88 m)   Wt 84 kg   SpO2 99%   BMI 23.78 kg/m   General: Pt is alert, awake, not in acute distress Cardiovascular: RRR, S1/S2 +, no rubs, no gallops Respiratory: CTA bilaterally, no wheezing, no rhonchi Abdominal: Soft, NT, ND, bowel sounds + Extremities: no edema, no cyanosis    The results of significant diagnostics from this hospitalization (including imaging, microbiology, ancillary and laboratory) are listed below for reference.     Microbiology: Recent Results (from the past 240 hours)  Urine Culture (for pregnant, neutropenic or urologic patients or patients with an indwelling urinary catheter)     Status: Abnormal   Collection Time: 03/13/24  1:23 PM   Specimen: Urine, Clean Catch  Result Value Ref Range Status   Specimen Description URINE, CLEAN CATCH  Final   Special Requests   Final    NONE Performed at [facility], [address], [place], [place] [address]    Culture >=100,000 COLONIES/mL YEAST (A)  Final   Report Status 03/15/2024 FINAL  Final  MRSA Next Gen by PCR, Nasal     Status: Abnormal   Collection Time: 03/14/24  1:53 AM   Specimen: Nasal Mucosa; Nasal Swab  Result Value Ref Range Status   MRSA by PCR Next Gen DETECTED (A) NOT DETECTED Final    Comment: RESULT CALLED  TO, READ BACK BY AND VERIFIED WITH: E CASTRO RN 03/14/2024 @ 0315 BY AB (NOTE) The GeneXpert MRSA Assay (FDA approved for NASAL specimens only), is one component of a comprehensive MRSA colonization surveillance program. It is not intended to diagnose MRSA infection nor to guide or monitor treatment for MRSA infections. Test performance is not FDA approved in patients less than 21 years old. Performed at [facility], [address], [place], [place] [address]   Culture, blood (Routine X 2) w Reflex to ID Panel     Status: None (Preliminary result)   Collection Time: 03/14/24  6:31 PM   Specimen: BLOOD  Result Value Ref Range Status   Specimen Description BLOOD  SITE NOT SPECIFIED  Final   Special Requests   Final    BOTTLES DRAWN AEROBIC AND ANAEROBIC Blood Culture results may not be optimal due to an inadequate volume of blood received in culture bottles   Culture   Final    NO GROWTH 3 DAYS Performed at [facility], [address], [place], [place] [address]    Report Status PENDING  Incomplete     Labs: Basic Metabolic Panel: Recent Labs  Lab 03/13/24 1323 03/14/24 0448 03/15/24 0456 03/16/24 0545  NA 136 133* 133* 132*  K 5.3* 4.0 4.3 4.2  CL 99 98 98 97*  CO2 25 25 23  20*  GLUCOSE 138* 95 264* 235*  BUN 37* 15 30* 37*  CREATININE 3.44* 1.94* 3.04* 3.95*  CALCIUM  8.9 8.4* 8.2* 8.0*  MG  --   --   --  1.8  PHOS  --   --  5.1* 6.9*   Liver Function Tests: Recent Labs  Lab 03/13/24 1323 03/14/24 0448 03/15/24 0456 03/16/24 0545  AST 35 36  --  21  ALT 51* 48*  --  35  ALKPHOS 136* 119  --  100  BILITOT 0.7 0.5  --  0.6  PROT 6.7 6.7  --  6.2*  ALBUMIN 2.0* 2.0* 1.9* 1.9*   CBC: Recent Labs  Lab 03/13/24 1323 03/14/24 0448 03/15/24 0456 03/16/24 0545  WBC 14.1* 12.6* 11.0* 10.7*  NEUTROABS  --   --  7.7  --   HGB 10.1* 10.0* 9.2* 9.0*  HCT 34.2* 32.9* 30.7* 30.5*  MCV 93.4 90.6 91.4 92.1  PLT 177 147* 116* 135*   CBG: Recent Labs  Lab 03/14/24 1703 03/15/24 2047 03/16/24 0748 03/16/24 0921 03/17/24 0547  GLUCAP 265* 80 231* 181* 330*   Hgb A1c No results for input(s): "HGBA1C" in the last 72 hours. Lipid Profile No results for input(s): "CHOL", "HDL", "LDLCALC", "TRIG", "CHOLHDL", "LDLDIRECT" in the last 72 hours. Thyroid function studies No results for input(s): "TSH", "T4TOTAL", "T3FREE", "THYROIDAB" in the last 72 hours.  Invalid input(s): "FREET3" Urinalysis    Component Value Date/Time   COLORURINE YELLOW 03/13/2024 1323   APPEARANCEUR TURBID (A) 03/13/2024 1323   LABSPEC 1.010 03/13/2024 1323   PHURINE 8.0 03/13/2024 1323   GLUCOSEU NEGATIVE 03/13/2024 1323   HGBUR SMALL (A)  03/13/2024 1323   BILIRUBINUR NEGATIVE 03/13/2024 1323   KETONESUR NEGATIVE 03/13/2024 1323   PROTEINUR 100 (A) 03/13/2024 1323   NITRITE NEGATIVE 03/13/2024 1323   LEUKOCYTESUR LARGE (A) 03/13/2024 1323    FURTHER DISCHARGE INSTRUCTIONS:   Get Medicines reviewed and adjusted: Please take all your medications with you for your next visit with your Primary MD   Laboratory/radiological data: Please request your Primary MD to go over all hospital  tests and procedure/radiological results at the follow up, please ask your Primary MD to get all Hospital records sent to his/her office.   In some cases, they will be blood work, cultures and biopsy results pending at the time of your discharge. Please request that your primary care M.D. goes through all the records of your hospital data and follows up on these results.   Also Note the following: If you experience worsening of your admission symptoms, develop shortness of breath, life threatening emergency, suicidal or homicidal thoughts you must seek medical attention immediately by calling 911 or calling your MD immediately  if symptoms less severe.   You must read complete instructions/literature along with all the possible adverse reactions/side effects for all the Medicines you take and that have been prescribed to you. Take any new Medicines after you have completely understood and accpet all the possible adverse reactions/side effects.    Do not drive when taking Pain medications or sleeping medications (Benzodaizepines)   Do not take more than prescribed Pain, Sleep and Anxiety Medications. It is not advisable to combine anxiety,sleep and pain medications without talking with your primary care practitioner   Special Instructions: If you have smoked or chewed Tobacco  in the last 2 yrs please stop smoking, stop any regular Alcohol  and or any Recreational drug use.   Wear Seat belts while driving.   Please note: You were cared for by a  hospitalist during your hospital stay. Once you are discharged, your primary care physician will handle any further medical issues. Please note that NO REFILLS for any discharge medications will be authorized once you are discharged, as it is imperative that you return to your primary care physician (or establish a relationship with a primary care physician if you do not have one) for your post hospital discharge needs so that they can reassess your need for medications and monitor your lab values.  Time coordinating discharge: 35 minutes  SIGNED:  Kathlen Para, MD, PhD 03/17/2024, 10:34 AM

## 2024-03-17 NOTE — Progress Notes (Signed)
 Discharge instructions (including medications) discussed with and copy provided to patient/caregiver

## 2024-03-17 NOTE — Progress Notes (Signed)
 Spoke to Mr. Domer wife advising her she stop Baclofen  which was noted on discharge summary. Baclofen  is contraindicated for patients on dialysis and also considering recent AMS. Patient's wife reports this was already advised to her by Hospitalist. She inquired an alternative to help with his muscle spasms. We can consider low-dose Flexeril at bedtime but I want him to complete his ABXs first and ensure his mental status fully returns to his baseline. She is in agreement with that and plan to discuss this further during routine rounds at his outpatient HD center.  Jadene Maxwell, NP [facility]

## 2024-03-17 NOTE — Progress Notes (Signed)
  [facility] Progress Note   Subjective:    Seen and examined patient at bedside. Denies any acute complaints. Tolerated yesterday's HD net UF 1L. Hopefully can go home soon. Next HD 5/10 if he remains inpatient.  Objective Vitals:   03/16/24 1840 03/16/24 1847 03/16/24 2010 03/17/24 0522  BP: 134/73 137/69 (!) 148/73 132/78  Pulse: 80 79 87 (!) 104  Resp: 15 16 18 18   Temp:  (!) 97.4 F (36.3 C) 98.6 F (37 C) 98.4 F (36.9 C)  TempSrc:   Oral Oral  SpO2: 100% 100% 100% 99%  Weight:  84 kg    Height:       Physical Exam General: Alert; awake, NAD, on RA Heart: S1 and S2; No murmurs, gallops, and rubs Lungs: Clear anteriorly Abdomen: Soft and non-tender Extremities: No LE edema Dialysis Access: [facility]   Filed Weights   03/16/24 0500 03/16/24 1454 03/16/24 1847  Weight: 71.2 kg 85 kg 84 kg    Intake/Output Summary (Last 24 hours) at 03/17/2024 1110 Last data filed at 03/17/2024 0500 Gross per 24 hour  Intake 480 ml  Output 1650 ml  Net -1170 ml    Additional Objective Labs: Basic Metabolic Panel: Recent Labs  Lab 03/14/24 0448 03/15/24 0456 03/16/24 0545  NA 133* 133* 132*  K 4.0 4.3 4.2  CL 98 98 97*  CO2 25 23 20*  GLUCOSE 95 264* 235*  BUN 15 30* 37*  CREATININE 1.94* 3.04* 3.95*  CALCIUM  8.4* 8.2* 8.0*  PHOS  --  5.1* 6.9*   Liver Function Tests: Recent Labs  Lab 03/13/24 1323 03/14/24 0448 03/15/24 0456 03/16/24 0545  AST 35 36  --  21  ALT 51* 48*  --  35  ALKPHOS 136* 119  --  100  BILITOT 0.7 0.5  --  0.6  PROT 6.7 6.7  --  6.2*  ALBUMIN 2.0* 2.0* 1.9* 1.9*   No results for input(s): "LIPASE", "AMYLASE" in the last 168 hours. CBC: Recent Labs  Lab 03/13/24 1323 03/14/24 0448 03/15/24 0456 03/16/24 0545  WBC 14.1* 12.6* 11.0* 10.7*  NEUTROABS  --   --  7.7  --   HGB 10.1* 10.0* 9.2* 9.0*  HCT 34.2* 32.9* 30.7* 30.5*  MCV 93.4 90.6 91.4 92.1  PLT 177 147* 116* 135*   Blood Culture    Component Value Date/Time    SDES BLOOD SITE NOT SPECIFIED 03/14/2024 1831   SPECREQUEST  03/14/2024 1831    BOTTLES DRAWN AEROBIC AND ANAEROBIC Blood Culture results may not be optimal due to an inadequate volume of blood received in culture bottles   CULT  03/14/2024 1831    NO GROWTH 3 DAYS Performed at [facility], [address] [place], [place] [address]    REPTSTATUS PENDING 03/14/2024 1831    Cardiac Enzymes: No results for input(s): "CKTOTAL", "CKMB", "CKMBINDEX", "TROPONINI" in the last 168 hours. CBG: Recent Labs  Lab 03/14/24 1703 03/15/24 2047 03/16/24 0748 03/16/24 0921 03/17/24 0547  GLUCAP 265* 80 231* 181* 330*   Iron Studies: No results for input(s): "IRON", "TIBC", "TRANSFERRIN", "FERRITIN" in the last 72 hours. Lab Results  Component Value Date   INR 1.1 09/18/2023   INR 1.5 (H) 07/01/2021   INR 1.0 06/04/2021   Studies/Results: IR GASTROSTOMY TUBE REMOVAL/REPAIR Result Date: 03/15/2024 INDICATION: Patient with history of prior ICH and now essentially bedbound with movement of the right upper extremity only, ESRD on TTS HD. He was admitted for seizure concerns. While  here, admitting team learned that he has not been needing the G-tube placed at another facility after his ICH and requested removal. Tract is well established. Initially placed in [place] , has since been exchanged to current balloon retention. EXAM: Gastrostomy tube removal MEDICATIONS: None ANESTHESIA/SEDATION: None CONTRAST:  None FLUOROSCOPY: None COMPLICATIONS: None immediate. PROCEDURE: Informed written consent was obtained from the patient's wife, Cayde Masley after a thorough discussion of the procedural risks, benefits and alternatives. All questions were addressed. Balloon retention deflated. Gastrostomy tube removed in its entirety without immediate complication. No immediate drainage from stoma. Dry gauze dressing placed. IMPRESSION: Successful removal of gastrostomy tube from established tract. Recommendations  for oral intake post-procedure provided to RN and patient's wife. Performed at bedside by Terressa Fess, NP Electronically Signed   By: Myrlene Asper D.O.   On: 03/15/2024 17:05    Medications:   amiodarone   100 mg Oral QPM   amLODipine   10 mg Oral QPM   atorvastatin   40 mg Oral QHS   baclofen   10 mg Oral QHS   carvedilol   25 mg Oral BID WC   Chlorhexidine  Gluconate Cloth  6 each Topical Q0600   collagenase   1 Application Topical Daily   darbepoetin (ARANESP ) injection - DIALYSIS  100 mcg Subcutaneous Q Wed-1800   finasteride   5 mg Oral Daily   fluconazole   100 mg Oral Daily   levETIRAcetam   750 mg Oral BID   liver oil-zinc  oxide   Topical BID   losartan   50 mg Oral Daily   mupirocin  ointment  1 Application Nasal BID   mupirocin  ointment  1 Application Topical Daily   pantoprazole   40 mg Oral Daily   PARoxetine   10 mg Oral Daily   sodium chloride  flush  3 mL Intravenous Q12H   sodium chloride  flush  3-10 mL Intravenous Q12H   sodium hypochlorite  1 Application Irrigation Daily   terazosin   1 mg Oral QHS    Dialysis Orders: TTS - [facility] 4 hrs. F160. EDW 75kg. RIJ TDC. Flow rates: 500/autoflow 1.5. 3K, 2.5Cal.  Heparin  No bolus, only hep locks Mircera 200 mcg q2wks - last 03/02/24 *Vanc 749mcg+cefepime  2g (until 5/17 and 5/13 respectively, abx for sacral OM)   Assessment/Plan: AMS/Concern for seizures - Received Keppra  load at admit. EEG (-) for seizures. Neurology signed off 5/7. Concern for UTI - Cultures (+) yeast. Zosyn  stopped and now on Diflucan  ESRD - on HD TTS. Removed 1L yesterday. Next HD 5/10 if remains inpatient. Hypertension/volume  - Euvolemic on exam. Bps variable: soft/stable. Off Lasix . Continue Carvedilol  and Losartan  for now and will adjust further if indicated Anemia of CKD - Hgb 9.2, Aranesp  100mcg given 5/7. Secondary Hyperparathyroidism -  Ca on lower side and phos okay. Monitor for now. PAF: On Amiodarone  and Coreg  Hx ICH:  Per Primary Chronic urinary retention: with suprapubic catheter Nutrition - Renal diet with fluid restriction Dispo - Okay for discharge from renal standpoint  Jadene Maxwell, NP [facility] 03/17/2024,11:10 AM  LOS: 3 days

## 2024-03-17 NOTE — Progress Notes (Signed)
 D/C order noted. Contacted [facility] to be advised of pt's d/c today and that pt should resume care tomorrow.   Olivia Canter Renal Navigator [phone]

## 2024-03-18 ENCOUNTER — Telehealth: Payer: Self-pay | Admitting: Nephrology

## 2024-03-18 NOTE — Telephone Encounter (Signed)
 Transition of Care - Initial Contact after Hospitalization  Date of discharge:  03/27/24 Date of contact: 03/18/24  Method: Phone Spoke to: Patient's wife   Patient contacted to discuss transition of care from recent inpatient hospitalization. Patient was admitted to [facility] from 5/5-03/17/24 with discharge diagnosis of acute encephalopathy 2/2 UTI.   The discharge medication list was reviewed.   Patient will return to his/her outpatient HD unit on: He went to his outpatient dialysis today Saturday. Started vomiting after he got home.

## 2024-03-19 LAB — CULTURE, BLOOD (ROUTINE X 2): Culture: NO GROWTH

## 2024-03-20 ENCOUNTER — Ambulatory Visit: Payer: Self-pay | Admitting: Infectious Diseases

## 2024-03-24 ENCOUNTER — Encounter (HOSPITAL_BASED_OUTPATIENT_CLINIC_OR_DEPARTMENT_OTHER): Admitting: Internal Medicine

## 2024-03-24 DIAGNOSIS — E11621 Type 2 diabetes mellitus with foot ulcer: Secondary | ICD-10-CM | POA: Diagnosis not present

## 2024-03-24 DIAGNOSIS — E11622 Type 2 diabetes mellitus with other skin ulcer: Secondary | ICD-10-CM | POA: Diagnosis not present

## 2024-03-24 DIAGNOSIS — L89610 Pressure ulcer of right heel, unstageable: Secondary | ICD-10-CM

## 2024-03-24 DIAGNOSIS — L89324 Pressure ulcer of left buttock, stage 4: Secondary | ICD-10-CM | POA: Diagnosis not present

## 2024-03-24 DIAGNOSIS — S80811A Abrasion, right lower leg, initial encounter: Secondary | ICD-10-CM

## 2024-03-24 DIAGNOSIS — L89154 Pressure ulcer of sacral region, stage 4: Secondary | ICD-10-CM | POA: Diagnosis not present

## 2024-03-24 DIAGNOSIS — L89892 Pressure ulcer of other site, stage 2: Secondary | ICD-10-CM

## 2024-03-31 ENCOUNTER — Ambulatory Visit (INDEPENDENT_AMBULATORY_CARE_PROVIDER_SITE_OTHER): Admitting: Infectious Diseases

## 2024-03-31 ENCOUNTER — Encounter: Payer: Self-pay | Admitting: Infectious Diseases

## 2024-03-31 ENCOUNTER — Other Ambulatory Visit: Payer: Self-pay

## 2024-03-31 VITALS — BP 119/74 | HR 77 | Temp 98.2°F

## 2024-03-31 DIAGNOSIS — M4628 Osteomyelitis of vertebra, sacral and sacrococcygeal region: Secondary | ICD-10-CM

## 2024-03-31 NOTE — Patient Instructions (Addendum)
 Will update the CRP / ESR today as we discussed for trending purposes but it is not specific to only the sacral bone and can be influneced by other things. I would not expect them to be in the normal range.   I think based off the other blood work and especially the sequential reports of improvement for the wound you are ready for a vac application to the sacral and ischial sites.   Keep a good eye on things after antibiotics are stopped -  Redness around the boarders that is increasing / expanding  Fevers  Drainage that is more in volume or especially if thicker / purulent / foul smelling   Continue the wound care per Dr. Lazaro Prime recommendations to keep the bacterial burden in the wound beds down.   I would ask about the benefit of adding zinc  or vitamin c  - will check a zinc  level here today to see where that is as well as vitamin D 

## 2024-03-31 NOTE — Progress Notes (Signed)
 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   Discussed the use of AI scribe software for clinical note transcription with the patient, who gave verbal consent to proceed.  History of Present Illness   Bruce Little is a 52 year old male with sacral osteomyelitis and multiple chronic wounds who presents for follow-up on after completing antibiotics.   He has chronic wounds on the right foot/heel, left ischium, and sacrum. The right foot wound is treated with Santal, the ischium wound with Santal every other day and Dakin's solution on alternate days, and the sacral wound with Dakin's solution.   His osteomyelitis was treated with a six-week course of Vancomycin  + Cefepime  with last doses given 5/17. He undergoes HD on T/Th/Sat. He has antibiotic-associated diarrhea with soft stools, distinct from his previous C. diff infections. Typically stool is more formed when not on ABX and easier to manage.   Recent lab work did not include sed rates or CRP, but previous results showed anemia with a hemoglobin level of 9.2 and a white blood cell count of 10.3. Past infections include pseudomonas and corynebacterium both growing on bone cultures.   No fever, increased drainage, or redness around the wounds. He experiences persistent leg spasms.      Has been seen at the wound clinic (Dr. Adriane Albe) for several wounds:  Right lateral knee, right calcaneous, right dorsal foot, right lateral foot, left ischium and sacrum.  Multiple wounds on lower extremities and sacrum / ischium - he completed 6  weeks of IV Vancomycin  + Cefepime  post HD sessions for cultures that grew out corynebacterium and Pseudomonas from debrided bone.   Wound Cx 12/2023 -  Rare Pseudomonas (R-ceftazidime )  Rare Corynebacterium Striatum   Most recent Labs:  WBC 10.3K HgB 9.2 (RDW 19.5, MCV 90)   Sed Rate  Date Value  09/18/2023 54 mm/hr (H)  10/13/2021 >130 mm/h (H)   CRP  Date Value  11/01/2023 15.3 mg/dL (H)  32/44/0102 72.5 mg/dL (H)  36/64/4034 74.2 mg/L (H)   Review of Systems  Constitutional:  Negative for chills, diaphoresis, fever and malaise/fatigue.  Gastrointestinal:  Positive for diarrhea. Negative for abdominal pain, nausea and vomiting.    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 05/03/2023   CKD (chronic kidney disease) stage 5, GFR less than 15 ml/min (HCC) 07/02/2021   COVID    has had it 2 times, one mild and one wasn't   Depression    DM2 (diabetes mellitus, type 2) (HCC)    Family history of adverse reaction to anesthesia    Dad has a "hard time waking up" after anesthesia   GERD (gastroesophageal reflux disease)    Hemorrhagic stroke (HCC) 04/30/2023   s/p right decompressive craniectomy and evacuation of hematoma on 04/30/2023  History of blood transfusion    HTN (hypertension)    ICH (intracerebral hemorrhage) (HCC) 09/17/2023   Osteomyelitis of fifth toe of left foot (HCC) 08/13/2021   Osteomyelitis of fourth toe of left foot (HCC) 08/13/2021   Pneumonia    Sacral decubitus ulcer 05/2023   Stroke (HCC) 05/19/2021   unable to move left side    Outpatient Medications Prior to Visit  Medication Sig Dispense Refill   acetaminophen  (TYLENOL ) 500 MG tablet Take 1,000 mg by mouth every 6 (six) hours as needed for mild pain (pain score 1-3) or headache.      albuterol  (VENTOLIN  HFA) 108 (90 Base) MCG/ACT inhaler Inhale 1 puff into the lungs every 6 (six) hours as needed for wheezing or shortness of breath.     amiodarone  (PACERONE ) 100 MG tablet Take 100 mg by mouth every evening.     amLODipine  (NORVASC ) 10 MG tablet Take 10 mg by mouth every evening.     atorvastatin  (LIPITOR ) 40 MG tablet Take 40 mg by mouth at bedtime.     baclofen  (LIORESAL ) 20 MG tablet Take 10 mg by mouth 2 (two) times daily.     carvedilol  (COREG ) 25 MG tablet Take 1 tablet (25 mg total) by mouth 2 (two) times daily with a meal. 60 tablet 0   collagenase  (SANTYL ) 250 UNIT/GM ointment Apply 1 Application topically daily.     Continuous Glucose Sensor (FREESTYLE LIBRE 3 PLUS SENSOR) MISC      DAKINS EX Apply 1 Application topically daily.     diphenhydramine -acetaminophen  (TYLENOL  PM) 25-500 MG TABS tablet Take 2 tablets by mouth at bedtime as needed (For sleep).     finasteride  (PROSCAR ) 5 MG tablet Take 5 mg by mouth daily.     furosemide  (LASIX ) 40 MG tablet Take 40 mg by mouth daily.     LANTUS  SOLOSTAR 100 UNIT/ML Solostar Pen Inject 5 Units into the skin daily. (Patient taking differently: Inject 5 Units into the skin at bedtime.) 15 mL 0   levETIRAcetam  (KEPPRA ) 750 MG tablet Take 1,500 mg by mouth 2 (two) times daily.     losartan  (COZAAR ) 50 MG tablet Take 1 tablet (50 mg total) by mouth daily. 30 tablet 0   melatonin 5 MG TABS Take 5-10 mg by mouth at bedtime as needed (sleep).     methocarbamol  (ROBAXIN ) 500 MG tablet Take 1 tablet (500 mg total) by mouth every 8 (eight) hours as needed for muscle spasms. (Patient taking differently: Take 500 mg by mouth 2 (two) times daily. May take a third 500 mg dose midday as needed for muscle spasms) 30 tablet 0   Multiple Vitamins-Minerals (EQ MULTIVITAMINS ADULT GUMMY PO) Take 1 tablet by mouth daily.     multivitamin (RENA-VIT) TABS tablet Take 1 tablet by mouth at bedtime. 30 tablet 0   mupirocin  ointment (BACTROBAN ) 2 %  Apply 1 Application topically 2 (two) times daily.     ondansetron  (ZOFRAN -ODT) 4 MG disintegrating tablet Take 4 mg by mouth every 8 (eight) hours as needed for nausea or vomiting.     pantoprazole  (PROTONIX ) 40 MG tablet Take 1 tablet (40 mg total) by mouth daily. 30 tablet 0   PARoxetine  (PAXIL ) 10 MG tablet Take 10 mg by mouth daily.     polyethylene glycol (MIRALAX  / GLYCOLAX ) 17 g packet Take 17 g by mouth 2 (two) times daily. (Patient taking differently: Take 17 g by mouth daily as needed for moderate constipation.) 60 each 0   promethazine  (  PHENERGAN ) 12.5 MG tablet Take 1 tablet (12.5 mg total) by mouth every 6 (six) hours as needed for nausea or vomiting. 60 tablet 0   promethazine  (PHENERGAN ) 25 MG suppository Place 1 suppository (25 mg total) rectally every 6 (six) hours as needed for nausea or vomiting. 12 each 0   senna-docusate (SENOKOT-S) 8.6-50 MG tablet Take 2 tablets by mouth 2 (two) times daily. (Patient taking differently: Take 2 tablets by mouth 2 (two) times daily as needed for mild constipation.) 120 tablet 0   sodium hypochlorite (DAKIN'S 1/2 STRENGTH) external solution Apply topically daily.     terazosin  (HYTRIN ) 1 MG capsule Take 1 mg by mouth at bedtime.     traZODone  (DESYREL ) 100 MG tablet Take 1 tablet (100 mg total) by mouth at bedtime as needed for sleep. 60 tablet 0   cloNIDine  (CATAPRES ) 0.1 MG tablet Take 1 tablet (0.1 mg total) by mouth daily. (Patient not taking: Reported on 03/13/2024) 30 tablet 0   No facility-administered medications prior to visit.     No Known Allergies  Social History   Tobacco Use   Smoking status: Never   Smokeless tobacco: Never  Vaping Use   Vaping status: Never Used  Substance Use Topics   Alcohol use: Not Currently   Drug use: Never    Family History  Problem Relation Age of Onset   Stroke Mother    Cancer Mother    Heart disease Father        Objective   Objective:   Vitals:   03/31/24 1000  BP: 119/74   Pulse: 77  Temp: 98.2 F (36.8 C)  TempSrc: Oral  SpO2: 98%   There is no height or weight on file to calculate BMI.  Physical Exam Constitutional:      Appearance: He is not ill-appearing.  Cardiovascular:     Rate and Rhythm: Normal rate.  Pulmonary:     Effort: Pulmonary effort is normal.  Musculoskeletal:     Comments: Seated in W/C. Having frequent muscle spasms with having to sit up in wheelchair.   Neurological:     Mental Status: He is alert and oriented to person, place, and time.       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 In review of sequential photos and wound clinic visits, there is a very small bone exposure that remains but largely a significant improvement in granulation tissue and size has decreased significantly. They will continue current regimen for wound care per Dr. Adriane Albe. No increased drainage or erythema. We discussed the limitations of inflammatory markers in people with other health conditions (esp ESRD and Anemia); no recent markers to compare to that I can see. I think in reference to his sacral wound and ischial wound he is ready for [facility] therapy to help this close and heal up further.  Discussed the fragile nature of these deep open wounds that have not re-epithelialized; increased risk for recurrent infections / new infections.  Importance of nutrition therapy, wound care to decrease bioburden in wounds and offloading --> they are doing a great job from what it sounds.  - Order sed rate and CRP  - vitamin D  and zinc  levels today  - Continue Juven supplementation for additional protein  - Agree his ready for [facility] therapy for sacral and ischial wounds. - Continue wound care with Santal and Dakin's solution. -  Avoid oral quinolones due to risk of C. difficile infection.  Open wounds of right foot - Chronic open wound on the right  foot with bone exposure. Treated with Santal and silver nitrate. Wound improving with decreased size and enhanced granulation tissue. No culture-positive infections. - Continue wound care with VASHE / Santyl  as directed by Dr. Adriane Albe  - Will need to watch the right anterior wound - has some hyper-grannulation tissue and may be that its too wet from photo review. We discussed signs of acute infection changes to be on the look out for.   Diarrhea -  H/O CDiff in 05-2023 - Diarrhea likely secondary to antibiotic use. No signs of Clostridium difficile infection. Soft stools reported, not consistent with Clostridium difficile symptoms. - Avoid quinolones due to risk of Clostridium difficile recurrence.  Follow-up Plan to monitor wound healing and response to treatment. Virtual follow-up preferred. - Schedule virtual follow-up in three weeks to assess wound healing and response to treatment. - Coordinate with dialysis for any necessary lab work.  Recording duration: 24 minutes       Orders Placed This Encounter  Procedures   Sedimentation rate   C-reactive protein   Zinc    Vitamin D  (25 hydroxy)    No orders of the defined types were placed in this encounter.   04/26/2024 video visit with myself arranged to follow up after stopping antibiotics.    Gibson Kurtz, MSN, NP-C [facility]  [email or web address] Pager: [phone] Office: [phone] [facility] Main Line: [phone] *Secure Chat Communication Welcome

## 2024-04-01 LAB — LAB REPORT - SCANNED: PTH: 84

## 2024-04-04 ENCOUNTER — Ambulatory Visit: Payer: Self-pay | Admitting: Infectious Diseases

## 2024-04-04 LAB — ZINC: Zinc: 43 ug/dL — ABNORMAL LOW (ref 60–130)

## 2024-04-04 LAB — SEDIMENTATION RATE: Sed Rate: 31 mm/h — ABNORMAL HIGH (ref 0–20)

## 2024-04-04 LAB — VITAMIN D 25 HYDROXY (VIT D DEFICIENCY, FRACTURES): Vit D, 25-Hydroxy: 71 ng/mL (ref 30–100)

## 2024-04-04 LAB — C-REACTIVE PROTEIN: CRP: 83.6 mg/L — ABNORMAL HIGH (ref <8.0)

## 2024-04-04 NOTE — Progress Notes (Signed)
 Some of his inflammatory markers are better and some are higher - it's really non-specific and I don't have anything recently to compare from this current episode. I think it's best to go off the clinical information in that his wound has been doing well  without any signs of infection and healing up. I would still recommend going forward with VAC therapy at this time.

## 2024-04-07 ENCOUNTER — Encounter (HOSPITAL_BASED_OUTPATIENT_CLINIC_OR_DEPARTMENT_OTHER): Admitting: Internal Medicine

## 2024-04-07 DIAGNOSIS — E11621 Type 2 diabetes mellitus with foot ulcer: Secondary | ICD-10-CM

## 2024-04-07 DIAGNOSIS — L89154 Pressure ulcer of sacral region, stage 4: Secondary | ICD-10-CM

## 2024-04-07 DIAGNOSIS — L89324 Pressure ulcer of left buttock, stage 4: Secondary | ICD-10-CM | POA: Diagnosis not present

## 2024-04-07 DIAGNOSIS — L89892 Pressure ulcer of other site, stage 2: Secondary | ICD-10-CM | POA: Diagnosis not present

## 2024-04-07 DIAGNOSIS — S81801A Unspecified open wound, right lower leg, initial encounter: Secondary | ICD-10-CM

## 2024-04-07 DIAGNOSIS — L89610 Pressure ulcer of right heel, unstageable: Secondary | ICD-10-CM | POA: Diagnosis not present

## 2024-04-07 DIAGNOSIS — S80811A Abrasion, right lower leg, initial encounter: Secondary | ICD-10-CM | POA: Diagnosis not present

## 2024-04-26 ENCOUNTER — Encounter: Payer: Self-pay | Admitting: Infectious Diseases

## 2024-04-26 ENCOUNTER — Telehealth (INDEPENDENT_AMBULATORY_CARE_PROVIDER_SITE_OTHER): Admitting: Infectious Diseases

## 2024-04-26 ENCOUNTER — Other Ambulatory Visit: Payer: Self-pay

## 2024-04-26 DIAGNOSIS — Z87440 Personal history of urinary (tract) infections: Secondary | ICD-10-CM | POA: Diagnosis not present

## 2024-04-26 DIAGNOSIS — M4628 Osteomyelitis of vertebra, sacral and sacrococcygeal region: Secondary | ICD-10-CM | POA: Diagnosis not present

## 2024-04-26 DIAGNOSIS — N186 End stage renal disease: Secondary | ICD-10-CM

## 2024-04-26 DIAGNOSIS — L89154 Pressure ulcer of sacral region, stage 4: Secondary | ICD-10-CM

## 2024-04-26 DIAGNOSIS — Z992 Dependence on renal dialysis: Secondary | ICD-10-CM

## 2024-04-26 MED ORDER — AMOXICILLIN-POT CLAVULANATE 500-125 MG PO TABS: 1.0000 | ORAL_TABLET | Freq: Two times a day (BID) | ORAL | 0 refills | Status: DC

## 2024-04-26 NOTE — Progress Notes (Unsigned)
 Patient: Bruce Little  DOB: 12/18/1970 MRN: [id number] PCP: Rusty Lonni HERO, MD   Chief Complaint  Patient presents with   Telehealth Consent    VIRTUAL CARE ENCOUNTER  I connected with [name] on 05/01/24 at  2:30 PM EDT by VIDEO and verified that I am speaking with the correct person using two identifiers.   I discussed the limitations, risks, security and privacy concerns of performing an evaluation and management service by telephone and the availability of in person appointments. I also discussed with the patient that there may be a patient responsible charge related to this service. The patient expressed understanding and agreed to proceed.  Patient Location: [facility]   Other Participants:   Provider Location: [facility] Office   Subjective  Subjective:   Chief Complaint  Patient presents with   Telehealth Consent    Hx ICH 05/2023 right side; left side hemiplegia Sacral ulcer since 05/2023 -- was treated with abx several weeks for morganella, ecoli, enterococcus. 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   Discussed the use of AI scribe software for clinical note transcription with the patient, who gave verbal consent to proceed.  History of Present Illness   Bruce Little is a 53 year old male with stage 4 healing sacral wound, stage 4 healing ischial wound and lower extremity wounds who presents for follow-up after completing antibiotic treatment.   At our last meeting 3 weeks ago he did have an elevated CRP that was unexplained as the condition of his wounds had improved. His wife mentioned that he recently completed a course of ciprofloxacin for what sounded like a prostate infection (renally dosed for ESRD).  His urine is now clear, and he feels better following the antibiotic treatment. He has a history of chronic indwelling catheter use, which is changed approximately once a month. There is a noted pattern of recurrent  urinary tract infections coinciding with the timing of catheter changes where he just before it is exchanged develops symptoms. She is wondering about the utility of chronic suppressive antibiotics in this setting in hopes to prevent infection. He is feeling significantly better since completing that round of antibiotic.   At the time of our video visit, his home health RN is there changing out VAC to sacrum / ischium and allowed me to see these sites today.  He has a wound on his foot that is being monitored. There is also a wound on the top of his ankle or foot that was treated with silver nitrate due to hypergranulation - she will send a photo of these later through MYchart   No current symptoms of cloudy urine or increased drainage from wounds.      Brief Narrative:  Has been seen at the wound clinic (Dr. Rosan) for several wounds:  Right lateral knee, right calcaneous, right dorsal foot, right lateral foot, left ischium and sacrum.  Multiple wounds on lower extremities and sacrum / ischium - he completed 6 weeks of IV Vancomycin  + Cefepime  post HD sessions for cultures that grew out corynebacterium and Pseudomonas from debrided bone.   Wound Cx 12/2023 -  Rare Pseudomonas (R-ceftazidime )  Rare Corynebacterium Striatum    Sed Rate  Date Value  03/31/2024 31 mm/h (H)  09/18/2023 54 mm/hr (H)  10/13/2021 >130 mm/h (H)   CRP  Date Value  03/31/2024 83.6 mg/L (H)  11/01/2023 15.3 mg/dL (H)  88/89/7975 75.4 mg/dL (H)  Review of Systems  Constitutional:  Negative for chills, diaphoresis, fever and malaise/fatigue.  Gastrointestinal:  Positive for diarrhea. Negative for abdominal pain, nausea and vomiting.    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 05/03/2023   CKD  (chronic kidney disease) stage 5, GFR less than 15 ml/min (HCC) 07/02/2021   COVID    has had it 2 times, one mild and one wasn't   Depression    DM2 (diabetes mellitus, type 2) (HCC)    Family history of adverse reaction to anesthesia    Dad has a hard time waking up after anesthesia   GERD (gastroesophageal reflux disease)    Hemorrhagic stroke (HCC) 04/30/2023   s/p right decompressive craniectomy and evacuation of hematoma on 04/30/2023   History of blood transfusion    HTN (hypertension)    ICH (intracerebral hemorrhage) (HCC) 09/17/2023   Osteomyelitis of fifth toe of left foot (HCC) 08/13/2021   Osteomyelitis of fourth toe of left foot (HCC) 08/13/2021   Pneumonia    Sacral decubitus ulcer 05/2023   Stroke (HCC) 05/19/2021   unable to move left side    Outpatient Medications Prior to Visit  Medication Sig Dispense Refill   acetaminophen  (TYLENOL ) 500 MG tablet Take 1,000 mg by mouth every 6 (six) hours as needed for mild pain (pain score 1-3) or headache.     albuterol  (VENTOLIN  HFA) 108 (90 Base) MCG/ACT inhaler Inhale 1 puff into the lungs every 6 (six) hours as needed for wheezing or shortness of breath.     amiodarone  (PACERONE ) 100 MG tablet Take 100 mg by mouth every evening.     amLODipine  (NORVASC ) 10 MG tablet Take 10 mg by mouth every evening.     atorvastatin  (LIPITOR ) 40 MG tablet Take 40 mg by mouth at bedtime.     baclofen  (LIORESAL ) 20 MG tablet Take 10 mg by mouth 2 (two) times daily.     carvedilol  (COREG ) 25 MG tablet Take 1 tablet (25 mg total) by mouth 2 (two) times daily with a meal. 60 tablet 0   collagenase  (SANTYL ) 250 UNIT/GM ointment Apply 1 Application topically daily.     Continuous Glucose Sensor (FREESTYLE LIBRE 3 PLUS SENSOR) MISC      DAKINS EX Apply 1 Application topically daily.     diphenhydramine -acetaminophen  (TYLENOL  PM) 25-500 MG TABS tablet Take 2 tablets by mouth at bedtime as needed (For sleep).     finasteride  (PROSCAR ) 5 MG tablet  Take 5 mg by mouth daily.     furosemide  (LASIX ) 40 MG tablet Take 40 mg by mouth daily.     LANTUS  SOLOSTAR 100 UNIT/ML Solostar Pen Inject 5 Units into the skin daily. (Patient taking differently: Inject 5 Units into the skin at bedtime.) 15 mL 0   levETIRAcetam  (KEPPRA ) 750 MG tablet Take 1,500 mg by mouth 2 (two) times daily.     losartan  (COZAAR ) 50 MG tablet Take 1 tablet (50 mg total) by mouth daily. 30 tablet 0   melatonin 5 MG TABS Take 5-10 mg by mouth at bedtime as needed (sleep).     methocarbamol  (ROBAXIN ) 500 MG tablet Take 1 tablet (500 mg total) by mouth every 8 (eight) hours as needed for muscle spasms. (Patient taking differently:  Take 500 mg by mouth 2 (two) times daily. May take a third 500 mg dose midday as needed for muscle spasms) 30 tablet 0   Multiple Vitamins-Minerals (EQ MULTIVITAMINS ADULT GUMMY PO) Take 1 tablet by mouth daily.     multivitamin (RENA-VIT) TABS tablet Take 1 tablet by mouth at bedtime. 30 tablet 0   mupirocin  ointment (BACTROBAN ) 2 % Apply 1 Application topically 2 (two) times daily.     ondansetron  (ZOFRAN -ODT) 4 MG disintegrating tablet Take 4 mg by mouth every 8 (eight) hours as needed for nausea or vomiting.     pantoprazole  (PROTONIX ) 40 MG tablet Take 1 tablet (40 mg total) by mouth daily. 30 tablet 0   PARoxetine  (PAXIL ) 10 MG tablet Take 10 mg by mouth daily.     polyethylene glycol (MIRALAX  / GLYCOLAX ) 17 g packet Take 17 g by mouth 2 (two) times daily. (Patient taking differently: Take 17 g by mouth daily as needed for moderate constipation.) 60 each 0   promethazine  (PHENERGAN ) 12.5 MG tablet Take 1 tablet (12.5 mg total) by mouth every 6 (six) hours as needed for nausea or vomiting. 60 tablet 0   promethazine  (PHENERGAN ) 25 MG suppository Place 1 suppository (25 mg total) rectally every 6 (six) hours as needed for nausea or vomiting. 12 each 0   senna-docusate (SENOKOT-S) 8.6-50 MG tablet Take 2 tablets by mouth 2 (two) times daily. (Patient  taking differently: Take 2 tablets by mouth 2 (two) times daily as needed for mild constipation.) 120 tablet 0   sodium hypochlorite (DAKIN'S 1/2 STRENGTH) external solution Apply topically daily.     terazosin  (HYTRIN ) 1 MG capsule Take 1 mg by mouth at bedtime.     traZODone  (DESYREL ) 100 MG tablet Take 1 tablet (100 mg total) by mouth at bedtime as needed for sleep. 60 tablet 0   No facility-administered medications prior to visit.     No Known Allergies  Social History   Tobacco Use   Smoking status: Never   Smokeless tobacco: Never  Vaping Use   Vaping status: Never Used  Substance Use Topics   Alcohol use: Not Currently   Drug use: Never    Family History  Problem Relation Age of Onset   Stroke Mother    Cancer Mother    Heart disease Father        Objective   Objective:   There were no vitals filed for this visit.  There is no height or weight on file to calculate BMI.  Physical Exam Constitutional:      Appearance: He is not ill-appearing.   Cardiovascular:     Rate and Rhythm: Normal rate.  Pulmonary:     Effort: Pulmonary effort is normal.   Musculoskeletal:     Comments: Seated in W/C. Having frequent muscle spasms with having to sit up in wheelchair.    Neurological:     Mental Status: He is alert and oriented to person, place, and time.       Assessment & Plan:     Healing Stage 4 Sacral and Ischial Wounds - Treated Osteomyelitis S/P 6 weeks of IV cefepime  and vancomycin  following dialysis sessions with last dose 1 month ago. On exam today the sacral wound appears improved with good tissue granulation and reduced size. There is still some visible bone noted. No purulent drainage. ? if he would benefit from some tissue accelerator/collagen product applications to promote granulation over bone. He has follow up with Dr. Rosan who is  helping with wound care.    Lower Extremity Wounds -  The right anterior foot wound, previously treated with  silver nitrate sounds to have improved - his wife sent photos via mychart portal for my review and it indeed is much shallower. He still has some adherent slough on the heel but this also looks improved and smaller. Discussed there is always a risk of infection if the skin barrier is compromised. Will have them keep on demand dose of Augmentin  at home to use if there is notable decline in wound or increase in drainage/signs of local skin infection.  - Prescribe Augmentin  to have on hand for potential wound infection, to be taken once daily after dialysis if needed. Notify ID clinic if they need to start this.   Recurrent urinary tract infections -  2/2 Chronic Indwelling Foley Catheter -  Taimur experiences recurrent UTIs, potentially linked to catheter use. Continuous antibiotic prophylaxis should be avoided due to the risk of antimicrobial resistance, which could necessitate the use of more toxic and expensive medications. Given chronic wounds present and the likely need for future treatability would look to other ways to prevent (?if catheters can be exchanged every 3 weeks instead of monthly). The CRP was elevated, possibly due to a recent bladder/prostate infection; he feels much improved after completing these antibiotics.  - His wife will discuss with primary care about changing catheter every three weeks to prevent UTIs. - Would not recommend chronic antibiotics due to almost certain emergence of antimicrobial resistance with gram negative organisms. We discussed today.   End-stage renal disease (ESRD) Semaj is on dialysis, noted when we consider dosing of antibiotics.   Diarrhea -  Resolved since stopping abx. He unfortunately received a quinolone recently but seems to have done OK with it.  H/O CDiff in 05-2023 -       No orders of the defined types were placed in this encounter.   Meds ordered this encounter  Medications   amoxicillin -clavulanate (AUGMENTIN ) 500-125 MG tablet     Sig: Take 1 tablet by mouth 2 (two) times daily. In the event of increased wound drainage    Dispense:  30 tablet    Refill:  0   FU PRN wound clinic concerns   Corean Fireman, MSN, NP-C [facility]  [email or web address] Pager: [phone] Office: [phone] [facility] Main Line: [phone] *Secure Chat Communication Welcome

## 2024-04-28 ENCOUNTER — Encounter (HOSPITAL_BASED_OUTPATIENT_CLINIC_OR_DEPARTMENT_OTHER): Attending: Internal Medicine | Admitting: Internal Medicine

## 2024-04-28 DIAGNOSIS — L89324 Pressure ulcer of left buttock, stage 4: Secondary | ICD-10-CM | POA: Diagnosis not present

## 2024-04-28 DIAGNOSIS — S80811A Abrasion, right lower leg, initial encounter: Secondary | ICD-10-CM | POA: Diagnosis not present

## 2024-04-28 DIAGNOSIS — Z6822 Body mass index (BMI) 22.0-22.9, adult: Secondary | ICD-10-CM | POA: Insufficient documentation

## 2024-04-28 DIAGNOSIS — E11621 Type 2 diabetes mellitus with foot ulcer: Secondary | ICD-10-CM | POA: Diagnosis not present

## 2024-04-28 DIAGNOSIS — Z89512 Acquired absence of left leg below knee: Secondary | ICD-10-CM | POA: Insufficient documentation

## 2024-04-28 DIAGNOSIS — S81801A Unspecified open wound, right lower leg, initial encounter: Secondary | ICD-10-CM | POA: Diagnosis not present

## 2024-04-28 DIAGNOSIS — E46 Unspecified protein-calorie malnutrition: Secondary | ICD-10-CM | POA: Insufficient documentation

## 2024-04-28 DIAGNOSIS — E1122 Type 2 diabetes mellitus with diabetic chronic kidney disease: Secondary | ICD-10-CM | POA: Insufficient documentation

## 2024-04-28 DIAGNOSIS — L89610 Pressure ulcer of right heel, unstageable: Secondary | ICD-10-CM | POA: Insufficient documentation

## 2024-04-28 DIAGNOSIS — L89154 Pressure ulcer of sacral region, stage 4: Secondary | ICD-10-CM | POA: Diagnosis not present

## 2024-04-28 DIAGNOSIS — N186 End stage renal disease: Secondary | ICD-10-CM | POA: Insufficient documentation

## 2024-04-28 DIAGNOSIS — Z794 Long term (current) use of insulin: Secondary | ICD-10-CM | POA: Insufficient documentation

## 2024-04-28 DIAGNOSIS — Z8673 Personal history of transient ischemic attack (TIA), and cerebral infarction without residual deficits: Secondary | ICD-10-CM | POA: Diagnosis not present

## 2024-04-28 DIAGNOSIS — I480 Paroxysmal atrial fibrillation: Secondary | ICD-10-CM | POA: Insufficient documentation

## 2024-04-28 DIAGNOSIS — L89892 Pressure ulcer of other site, stage 2: Secondary | ICD-10-CM | POA: Insufficient documentation

## 2024-04-28 DIAGNOSIS — E11622 Type 2 diabetes mellitus with other skin ulcer: Secondary | ICD-10-CM | POA: Insufficient documentation

## 2024-05-09 ENCOUNTER — Telehealth: Payer: Self-pay

## 2024-05-09 NOTE — Telephone Encounter (Signed)
 Patients spouse called regarding swelling of the left arm HD access site. [employer] called and RN stated she would contact nephrologist to refer patient to [facility].  The patients wife was called to let her know the HD center will need to refer the patient and she verbalized understanding of this process.  The spouse was instructed to go to ER attention if the left arm or hand has increased pain, changes color or temperature or if there is bleeding or pus from the access site.  She verbalized understanding of these instructions.

## 2024-05-15 ENCOUNTER — Encounter (HOSPITAL_BASED_OUTPATIENT_CLINIC_OR_DEPARTMENT_OTHER): Attending: Internal Medicine | Admitting: Internal Medicine

## 2024-05-15 DIAGNOSIS — E11621 Type 2 diabetes mellitus with foot ulcer: Secondary | ICD-10-CM | POA: Insufficient documentation

## 2024-05-15 DIAGNOSIS — L89324 Pressure ulcer of left buttock, stage 4: Secondary | ICD-10-CM | POA: Insufficient documentation

## 2024-05-15 DIAGNOSIS — Z89512 Acquired absence of left leg below knee: Secondary | ICD-10-CM | POA: Insufficient documentation

## 2024-05-15 DIAGNOSIS — E1122 Type 2 diabetes mellitus with diabetic chronic kidney disease: Secondary | ICD-10-CM | POA: Diagnosis not present

## 2024-05-15 DIAGNOSIS — L89154 Pressure ulcer of sacral region, stage 4: Secondary | ICD-10-CM | POA: Insufficient documentation

## 2024-05-15 DIAGNOSIS — L89892 Pressure ulcer of other site, stage 2: Secondary | ICD-10-CM | POA: Diagnosis not present

## 2024-05-15 DIAGNOSIS — E1169 Type 2 diabetes mellitus with other specified complication: Secondary | ICD-10-CM | POA: Diagnosis not present

## 2024-05-15 DIAGNOSIS — L89610 Pressure ulcer of right heel, unstageable: Secondary | ICD-10-CM | POA: Diagnosis not present

## 2024-05-15 DIAGNOSIS — S80811A Abrasion, right lower leg, initial encounter: Secondary | ICD-10-CM | POA: Diagnosis not present

## 2024-05-15 DIAGNOSIS — S81801A Unspecified open wound, right lower leg, initial encounter: Secondary | ICD-10-CM | POA: Insufficient documentation

## 2024-05-15 DIAGNOSIS — N186 End stage renal disease: Secondary | ICD-10-CM | POA: Insufficient documentation

## 2024-05-18 ENCOUNTER — Other Ambulatory Visit: Payer: Self-pay | Admitting: *Deleted

## 2024-05-18 DIAGNOSIS — N186 End stage renal disease: Secondary | ICD-10-CM

## 2024-05-19 ENCOUNTER — Ambulatory Visit (HOSPITAL_BASED_OUTPATIENT_CLINIC_OR_DEPARTMENT_OTHER): Admitting: Internal Medicine

## 2024-05-26 ENCOUNTER — Ambulatory Visit (HOSPITAL_COMMUNITY)
Admission: RE | Admit: 2024-05-26 | Discharge: 2024-05-26 | Disposition: A | Discharge status: Home / Self Care | Source: Ambulatory Visit | Attending: Vascular Surgery | Admitting: Vascular Surgery

## 2024-05-26 DIAGNOSIS — N186 End stage renal disease: Secondary | ICD-10-CM | POA: Insufficient documentation

## 2024-06-02 ENCOUNTER — Encounter (HOSPITAL_BASED_OUTPATIENT_CLINIC_OR_DEPARTMENT_OTHER): Admitting: Internal Medicine

## 2024-06-02 DIAGNOSIS — L89892 Pressure ulcer of other site, stage 2: Secondary | ICD-10-CM

## 2024-06-02 DIAGNOSIS — E11621 Type 2 diabetes mellitus with foot ulcer: Secondary | ICD-10-CM | POA: Diagnosis not present

## 2024-06-02 DIAGNOSIS — L89610 Pressure ulcer of right heel, unstageable: Secondary | ICD-10-CM

## 2024-06-02 DIAGNOSIS — L89154 Pressure ulcer of sacral region, stage 4: Secondary | ICD-10-CM | POA: Diagnosis not present

## 2024-06-02 DIAGNOSIS — L89324 Pressure ulcer of left buttock, stage 4: Secondary | ICD-10-CM | POA: Diagnosis not present

## 2024-06-08 NOTE — Progress Notes (Unsigned)
 Office Note     CC:  follow up Requesting Provider:  Macel Jayson PARAS, MD  HPI: Bruce Little is a 53 y.o. (Nov 26, 1970) male who presents to clinic for evaluation of left arm edema and bruising overlying left arm fistula.  He underwent left brachiocephalic fistula creation in March of this year by Dr. Sheree.  ***   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 05/03/2023   CKD (chronic kidney disease) stage 5, GFR less than 15 ml/min (HCC) 07/02/2021   COVID    has had it 2 times, one mild and one wasn't   Depression    DM2 (diabetes mellitus, type 2) (HCC)    Family history of adverse reaction to anesthesia    Dad has a hard time waking up after anesthesia   GERD (gastroesophageal reflux disease)    Hemorrhagic stroke (HCC) 04/30/2023   s/p right decompressive craniectomy and evacuation of hematoma on 04/30/2023   History of blood transfusion    HTN (hypertension)    ICH (intracerebral hemorrhage) (HCC) 09/17/2023   Osteomyelitis of fifth toe of left foot (HCC) 08/13/2021   Osteomyelitis of fourth toe of left foot (HCC) 08/13/2021   Pneumonia    Sacral decubitus ulcer 05/2023   Stroke (HCC) 05/19/2021   unable to move left side    Past Surgical History:  Procedure Laterality Date   AMPUTATION Left 08/29/2021   Procedure: AMPUTATION OF FOURTH TOE AND RAY ALONG WITH REMAINING FITH METATARSAL;  Surgeon: Jerri Kay HERO, MD;  Location: [facility] OR;  Service: Orthopedics;  Laterality: Left;   AMPUTATION Left 09/03/2021   Procedure: LISFRANC AMPUTATION;  Surgeon: Jerri Kay HERO, MD;  Location: [facility] OR;  Service: Orthopedics;  Laterality: Left;   AMPUTATION Left 10/29/2021   Procedure: AMPUTATION BELOW KNEE -LEFT;  Surgeon: Jerri Kay HERO, MD;  Location: [facility] OR;  Service: Orthopedics;   Laterality: Left;   APPLICATION OF WOUND VAC Left 07/07/2021   Procedure: APPLICATION OF WOUND VAC;  Surgeon: Jerri Kay HERO, MD;  Location: [facility] OR;  Service: Orthopedics;  Laterality: Left;   APPLICATION OF WOUND VAC Left 08/29/2021   Procedure: APPLICATION OF WOUND VAC;  Surgeon: Jerri Kay HERO, MD;  Location: [facility] OR;  Service: Orthopedics;  Laterality: Left;   AV FISTULA PLACEMENT Left 01/18/2024   Procedure: LEFT ARM ARTERIOVENOUS (AV) FISTULA CREATION;  Surgeon: Sheree Penne Bruckner, MD;  Location: [facility] OR;  Service: Vascular;  Laterality: Left;   DIALYSIS/PERMA CATHETER INSERTION N/A 12/28/2023   Procedure: DIALYSIS/PERMA CATHETER INSERTION;  Surgeon: Tobie Gordy POUR, MD;  Location: [facility] INVASIVE CV LAB;  Service: Cardiovascular;  Laterality: N/A;   DIALYSIS/PERMA CATHETER REMOVAL N/A 12/28/2023   Procedure: DIALYSIS/PERMA CATHETER REMOVAL;  Surgeon: Tobie Gordy POUR, MD;  Location: [facility] INVASIVE CV LAB;  Service: Cardiovascular;  Laterality: N/A;   I & D EXTREMITY Left 07/03/2021   Procedure: IRRIGATION AND DEBRIDEMENT ,FIFTH RAY  AMPUTATION LEFT FOOT, , WOUND VAC PLACEMENT;  Surgeon: Jerri Kay HERO, MD;  Location: [facility] OR;  Service: Orthopedics;  Laterality: Left;   I & D EXTREMITY Left 07/07/2021   Procedure: IRRIGATION AND DEBRIDEMENT LEFT FOOT;  Surgeon: Jerri Kay HERO, MD;  Location: [facility] OR;  Service: Orthopedics;  Laterality: Left;   I &  D EXTREMITY Left 08/29/2021   Procedure: IRRIGATION AND DEBRIDEMENT LEFT FOOT;  Surgeon: Jerri Kay HERO, MD;  Location: [facility] OR;  Service: Orthopedics;  Laterality: Left;   IR CYSTOSTOMY TUBE CHANGE COMPLICATED W IMG  02/21/2024   IR FLUORO GUIDE CV LINE RIGHT  07/09/2021   IR GASTROSTOMY TUBE REMOVAL  03/15/2024   IR RADIOLOGIST EVAL & MGMT  01/05/2024   suprapubic cath- pt's wife states the bleeding from that has resolved   IR REMOVAL TUN CV CATH W/O FL  10/08/2021   IR REMOVAL TUN CV CATH W/O FL  07/13/2023   IR REPLACE G-TUBE SIMPLE WO FLUORO  09/20/2023   IR US   GUIDE VASC ACCESS RIGHT  07/09/2021   TRACHEOSTOMY     05/14/2023 - 07/23/2023   VITRECTOMY Left    [place] [facility]    Social History   Socioeconomic History   Marital status: Married    Spouse name: Rosina   Number of children: Not on file   Years of education: Not on file   Highest education level: Not on file  Occupational History   Not on file  Tobacco Use   Smoking status: Never   Smokeless tobacco: Never  Vaping Use   Vaping status: Never Used  Substance and Sexual Activity   Alcohol use: Not Currently   Drug use: Never   Sexual activity: Not on file  Other Topics Concern   Not on file  Social History Narrative   Not on file   Social Drivers of Health   Financial Resource Strain: Low Risk  (08/10/2023)   Received from [facility]   Overall Financial Resource Strain (CARDIA)    Difficulty of Paying Living Expenses: Not hard at all  Food Insecurity: No Food Insecurity (03/14/2024)   Hunger Vital Sign    Worried About Radiation protection practitioner of Food in the Last Year: Never true    Ran Out of Food in the Last Year: Never true  Transportation Needs: No Transportation Needs (03/14/2024)   PRAPARE - Administrator, Civil Service (Medical): No    Lack of Transportation (Non-Medical): No  Physical Activity: Not on file  Stress: No Stress Concern Present (08/10/2023)   Received from [facility]   [employer] of Occupational Health - Occupational Stress Questionnaire    Feeling of Stress : Not at all  Social Connections: Unknown (08/13/2023)   Received from [facility]   Social Network    Social Network: Not on file  Intimate Partner Violence: Not At Risk (03/14/2024)   Humiliation, Afraid, Rape, and Kick questionnaire    Fear of Current or Ex-Partner: No    Emotionally Abused: No    Physically Abused: No    Sexually Abused: No    Family History  Problem Relation Age of Onset   Stroke Mother    Cancer Mother    Heart disease Father     Current Outpatient  Medications  Medication Sig Dispense Refill   acetaminophen  (TYLENOL ) 500 MG tablet Take 1,000 mg by mouth every 6 (six) hours as needed for mild pain (pain score 1-3) or headache.     albuterol  (VENTOLIN  HFA) 108 (90 Base) MCG/ACT inhaler Inhale 1 puff into the lungs every 6 (six) hours as needed for wheezing or shortness of breath.     amiodarone  (PACERONE ) 100 MG tablet Take 100 mg by mouth every evening.     amLODipine  (NORVASC ) 10 MG tablet Take 10 mg by mouth every evening.  amoxicillin -clavulanate (AUGMENTIN ) 500-125 MG tablet Take 1 tablet by mouth 2 (two) times daily. In the event of increased wound drainage 30 tablet 0   atorvastatin  (LIPITOR ) 40 MG tablet Take 40 mg by mouth at bedtime.     baclofen  (LIORESAL ) 20 MG tablet Take 10 mg by mouth 2 (two) times daily.     carvedilol  (COREG ) 25 MG tablet Take 1 tablet (25 mg total) by mouth 2 (two) times daily with a meal. 60 tablet 0   collagenase  (SANTYL ) 250 UNIT/GM ointment Apply 1 Application topically daily.     Continuous Glucose Sensor (FREESTYLE LIBRE 3 PLUS SENSOR) MISC      DAKINS EX Apply 1 Application topically daily.     diphenhydramine -acetaminophen  (TYLENOL  PM) 25-500 MG TABS tablet Take 2 tablets by mouth at bedtime as needed (For sleep).     finasteride  (PROSCAR ) 5 MG tablet Take 5 mg by mouth daily.     furosemide  (LASIX ) 40 MG tablet Take 40 mg by mouth daily.     LANTUS  SOLOSTAR 100 UNIT/ML Solostar Pen Inject 5 Units into the skin daily. (Patient taking differently: Inject 5 Units into the skin at bedtime.) 15 mL 0   levETIRAcetam  (KEPPRA ) 750 MG tablet Take 1,500 mg by mouth 2 (two) times daily.     losartan  (COZAAR ) 50 MG tablet Take 1 tablet (50 mg total) by mouth daily. 30 tablet 0   melatonin 5 MG TABS Take 5-10 mg by mouth at bedtime as needed (sleep).     methocarbamol  (ROBAXIN ) 500 MG tablet Take 1 tablet (500 mg total) by mouth every 8 (eight) hours as needed for muscle spasms. (Patient taking differently:  Take 500 mg by mouth 2 (two) times daily. May take a third 500 mg dose midday as needed for muscle spasms) 30 tablet 0   Multiple Vitamins-Minerals (EQ MULTIVITAMINS ADULT GUMMY PO) Take 1 tablet by mouth daily.     multivitamin (RENA-VIT) TABS tablet Take 1 tablet by mouth at bedtime. 30 tablet 0   mupirocin  ointment (BACTROBAN ) 2 % Apply 1 Application topically 2 (two) times daily.     ondansetron  (ZOFRAN -ODT) 4 MG disintegrating tablet Take 4 mg by mouth every 8 (eight) hours as needed for nausea or vomiting.     pantoprazole  (PROTONIX ) 40 MG tablet Take 1 tablet (40 mg total) by mouth daily. 30 tablet 0   PARoxetine  (PAXIL ) 10 MG tablet Take 10 mg by mouth daily.     polyethylene glycol (MIRALAX  / GLYCOLAX ) 17 g packet Take 17 g by mouth 2 (two) times daily. (Patient taking differently: Take 17 g by mouth daily as needed for moderate constipation.) 60 each 0   promethazine  (PHENERGAN ) 12.5 MG tablet Take 1 tablet (12.5 mg total) by mouth every 6 (six) hours as needed for nausea or vomiting. 60 tablet 0   promethazine  (PHENERGAN ) 25 MG suppository Place 1 suppository (25 mg total) rectally every 6 (six) hours as needed for nausea or vomiting. 12 each 0   senna-docusate (SENOKOT-S) 8.6-50 MG tablet Take 2 tablets by mouth 2 (two) times daily. (Patient taking differently: Take 2 tablets by mouth 2 (two) times daily as needed for mild constipation.) 120 tablet 0   sodium hypochlorite (DAKIN'S 1/2 STRENGTH) external solution Apply topically daily.     terazosin  (HYTRIN ) 1 MG capsule Take 1 mg by mouth at bedtime.     traZODone  (DESYREL ) 100 MG tablet Take 1 tablet (100 mg total) by mouth at bedtime as needed for sleep. 60 tablet  0   No current facility-administered medications for this visit.    No Known Allergies   REVIEW OF SYSTEMS:   [X]  denotes positive finding, [ ]  denotes negative finding Cardiac  Comments:  Chest pain or chest pressure:    Shortness of breath upon exertion:    Short  of breath when lying flat:    Irregular heart rhythm:        Vascular    Pain in calf, thigh, or hip brought on by ambulation:    Pain in feet at night that wakes you up from your sleep:     Blood clot in your veins:    Leg swelling:         Pulmonary    Oxygen at home:    Productive cough:     Wheezing:         Neurologic    Sudden weakness in arms or legs:     Sudden numbness in arms or legs:     Sudden onset of difficulty speaking or slurred speech:    Temporary loss of vision in one eye:     Problems with dizziness:         Gastrointestinal    Blood in stool:     Vomited blood:         Genitourinary    Burning when urinating:     Blood in urine:        Psychiatric    Major depression:         Hematologic    Bleeding problems:    Problems with blood clotting too easily:        Skin    Rashes or ulcers:        Constitutional    Fever or chills:      PHYSICAL EXAMINATION:  There were no vitals filed for this visit.***  General:  WDWN in NAD; vital signs documented above Gait: Not observed HENT: WNL, normocephalic Pulmonary: normal non-labored breathing Cardiac: regular HR Abdomen: soft, NT, no masses Skin: without rashes Vascular Exam/Pulses: *** Musculoskeletal: no muscle wasting or atrophy  Neurologic: A&O X 3 Psychiatric:  The pt has Normal affect.   Non-Invasive Vascular Imaging:   Fistula duplex demonstrates widely patent fistula with adequate diameter and flow volume    ASSESSMENT/PLAN:: 53 y.o. male here for follow up for evaluation of edematous left arm with bruising overlying brachiocephalic fistula   -***   Donnice Sender, PA-C [facility] [phone]  Clinic MD:   [place]

## 2024-06-09 ENCOUNTER — Ambulatory Visit: Attending: Vascular Surgery | Admitting: Physician Assistant

## 2024-06-09 VITALS — BP 104/66 | HR 55 | Temp 97.9°F

## 2024-06-09 DIAGNOSIS — N186 End stage renal disease: Secondary | ICD-10-CM | POA: Diagnosis not present

## 2024-06-12 ENCOUNTER — Encounter: Payer: Self-pay | Admitting: Neurology

## 2024-06-12 ENCOUNTER — Ambulatory Visit (INDEPENDENT_AMBULATORY_CARE_PROVIDER_SITE_OTHER): Payer: BC Managed Care – PPO | Admitting: Neurology

## 2024-06-12 VITALS — BP 128/84 | HR 86

## 2024-06-12 DIAGNOSIS — G4709 Other insomnia: Secondary | ICD-10-CM | POA: Diagnosis not present

## 2024-06-12 DIAGNOSIS — I611 Nontraumatic intracerebral hemorrhage in hemisphere, cortical: Secondary | ICD-10-CM | POA: Diagnosis not present

## 2024-06-12 DIAGNOSIS — G40009 Localization-related (focal) (partial) idiopathic epilepsy and epileptic syndromes with seizures of localized onset, not intractable, without status epilepticus: Secondary | ICD-10-CM

## 2024-06-12 DIAGNOSIS — G8114 Spastic hemiplegia affecting left nondominant side: Secondary | ICD-10-CM | POA: Diagnosis not present

## 2024-06-12 MED ORDER — MELATONIN 10 MG PO CAPS: 1.0000 | ORAL_CAPSULE | ORAL | 1 refills | Status: AC

## 2024-06-12 MED ORDER — TRAZODONE HCL 100 MG PO TABS: 150.0000 mg | ORAL_TABLET | Freq: Every evening | ORAL | 0 refills | Status: DC | PRN

## 2024-06-12 NOTE — Patient Instructions (Addendum)
 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 I also advised the patient to eat a healthy diet with plenty of whole grains, cereals, fruits and vegetables, exercise regularly and maintain ideal body weight .continue ongoing physical occupational and speech therapy.  Continue Keppra  in the current dose of 750 mg  twice daily for seizure prophylaxis.  I recommend patient start taking melatonin 10 mg at [facility] and increase trazodone  to 150 mg at night to help with sleep.  He has an upcoming appointment with primary care physician advised him to discuss his multiple blood pressure medications and sleeping concerns as well.  Followup in the future with with me only as necessary or call earlier if necessary

## 2024-06-12 NOTE — Progress Notes (Signed)
 [facility] [address] [place]. [place] [address] [phone]       STROKE FOLLOW UP NOTE  Bruce Little Date of Birth:  05/08/1971 Medical Record Number:  [id number]   Reason for Referral: stroke follow up    CHIEF COMPLAINT:  Stroke follow-up   HPI:  Update 06/12/2024 : He returns for follow-up after last visit 8 months ago.  He is accompanied by his wife.  Patient continues to have spastic left hemiplegia and is wheelchair-bound.  He had a recent admission in May 2025 due to altered mental status which was felt to be due to metabolic encephalopathy in the setting of UTI.  The wife at there is some concerns about intermittent episodes of right upper extremity going up possibly seizures however he underwent prolonged overnight long-term EEG monitoring which was negative for any suspicious electrographic seizures.  Patient was continued on the home medication regimen of Keppra  1500 mg twice daily.  Patient has had no recurrent stroke or TIA symptoms.  He remains off anticoagulation due to his history of intracerebral hemorrhage for his A-fib.  Patient has had some increasing anxiety recently.  Paxil  dose was increased to 20 mg daily.  He also has insomnia.  Takes trazodone  100 mg at night.  Seems to have developed some tolerance as it was working initially.  Patient's blood pressures have fluctuated especially on postdialysis days he is quite hypotensive.  He does have an appointment with his primary care physician next week and we will discuss blood pressure management and sleep issues. Update 10/18/2023 : Patient is seen for follow-up today after last visit with Bruce Little nurse practitioner year and half ago.  Patient was visiting [place]  for her teachers conference when his wife did not hear from him for a day.  She could track him in the hotel room though he was supposed to be at the children's game.  She called the hotel and when there was no response from inside the  room EMS was called.  Patient was admitted at [facility] in [place] ,[place]  from 04/30/2023 to 07/01/2023.  I have reviewed the patient's hospital course on electronic medical records which the patient's wife was able to share with me on her phone.  Patient presented with altered mental status with vomiting and was found to be significantly hypertensive in the emergency room with a blood pressure 226/100.  Noncontrast CT head showed a large 9.2 x 5.5 cm right frontotemporal parenchymal hematoma with cytotoxic edema centimeter leftward midline shift.  He was taken for emergency hemicraniectomy with hematoma evacuation and EVD placement.  Hospital course was complicated by Staph epidermidis in the CSF on 05/26/2023 with repeat growth on 05/31/2023 and 06/02/2023.  He was treated with antibiotics and eventually EVD was removed.  There was debridement done of the flap and the drain was placed.  Patient also developed sacral decubitus ulcer and underwent serial debridements and sacral wounds grew E. coli, Morganella and Enterococcus faecalis he was treated with antibiotics for several weeks with this.  Hospital course was also complicated by C. difficile diarrhea which was treated with a full course of IV vancomycin .  Patient had a tracheostomy and a PEG tube in he initially developed renal failure requiring dialysis.  [facility] [place] [facility] from 07/01/2023 to 08/10/2023 tracheostomy was decannulated and he was weaned to room air.  He was seen by speech therapy and started on a p.o. diet.  [facility] for another month.  She subsequently went home but developed a UTI and was admitted to [facility] for 2 weeks from 09/17/2023 to 10/04/2023.  He had a focal seizure there was started on Keppra .  EEG on 09/27/2023 showed cortical dysfunction arising from the right central parietal  region but no seizures were noted during the recording.  Patient has since now been home for 1 week.  He is started home physical occupational and speech therapy.  He still bedbound.  Any attempts to move him result in nausea and hypertension.  Patient has dense left hemiplegia.  Is able to move his right arm purposefully but right leg has some weakness though he can move it off the bed.  He does have a indwelling Foley catheter and is having several bouts of UTIs and is presently on antibiotic with amoxicillin . Update 02/23/2022 Bruce Little: Patient returns for stroke follow-up after prior visit 7 months ago.  Stable from stroke standpoint without new stroke/TIA symptoms.  Remains on Eliquis  5 mg twice daily and atorvastatin , denies side effects.  Blood pressure routinely monitored at home which has been stable - typically 120s/80s. Closely follows with PCP and cardiology. Reports continued weight loss with current weight 195 pounds down from 260 lbs. 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 Jerri.  He has been slowly trying to work with prosthetic, closely followed by orthopedics Dr. Jerri. 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  No further concerns no further concerns at this time    History provided for reference purposes only Initial visit 08/04/2021 Bruce Little: [name] is being seen for hospital follow-up accompanied by his wife, Bruce Little.  Overall stable from stroke standpoint.  Denies new or reoccurring stroke/TIA symptoms.  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 Post op, developed A. fib and placed on Eliquis  5 mg twice daily. He has been gradually recovering, remains nonweightbearing and use of wound VAC -routinely followed by infectious  disease, orthopedics and wound care.  He has remained on Eliquis  5 mg twice daily as well as atorvastatin  and fenofibrate  tolerating without side effects.  Blood pressure today 157/97. Routinely monitors at home which has been fluctuating based on pain and activity levels. Glucose levels monitored at home and typically 100-130.  He is questioning undergoing retina surgery previously scheduled on 8/9 but in setting of stroke, this was postponed until follow up with our office (initially scheduled f/u on 8/31 but rescheduled as he was hospitalized).  No further concerns at this time.  Stroke admission 05/19/2021 Bruce Little is a 53 y.o. male with history of DM2, HTN, who presented on 05/19/2021 with Left sided weakness and numbness since 7/9.  Personally reviewed hospitalization pertinent progress notes, lab work and imaging.  Evaluated by Dr. Jerri for right caudate infarct secondary to small vessel disease source.  Consult evidence of old right thalamus infarct and right BG ICH.  MRA head mild stenosis proximal L P2 segment.  Carotid Doppler right ICA 40 to 59% stenosis in left ICA 1 to 39% stenosis. Was not felt to symptomatic but did recommend follow-up with VVS OP.  EF 60 to 65%.  Direct LDL 122.9 and TG 456-initiated atorvastatin  80 mg daily and fenofibrate  160 mg daily.  A1c 7.4. He was found to have severe elevated creatinine level as well as significantly elevated BP.  Evaluated by nephrology with plans on completing renal biopsy OP.  Recommended DAPT for 3 weeks then Plavix  alone as on aspirin  PTA.  Evaluated by therapies without therapy needs and discharged home.      PERTINENT IMAGING  MR BRAIN 05/19/2021 MR ANGIO HEAD IMPRESSION: 1. Small acute/early subacute infarct of the right caudate body. No hemorrhage or mass effect. 2. 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 3. Mild stenosis of the proximal left P2 segment.  VAS US  CAROTID DUPLEX 05/20/2021 Summary:  Right Carotid:  Velocities in the right ICA are consistent with a 40-59% stenosis.  Left Carotid: Velocities in the left ICA are consistent with a 1-39% stenosis.  Vertebrals: Bilateral vertebral arteries demonstrate antegrade flow.   ECHO 05/20/2021 IMPRESSIONS   1. Left ventricular ejection fraction, by estimation, is 60 to 65%. The  left ventricle has normal function. The left ventricle has no regional  wall motion abnormalities. There is mild concentric left ventricular  hypertrophy. Left ventricular diastolic  function could not be evaluated.   2. Right ventricular systolic function is normal. The right ventricular  size is normal. Tricuspid regurgitation signal is inadequate for assessing  PA pressure.   3. Left atrial size was mildly dilated.   4. The mitral valve is normal in structure. Trivial mitral valve  regurgitation. No evidence of mitral stenosis.   5. The aortic valve is normal in structure. Aortic valve regurgitation is  not visualized. No aortic stenosis is present.   6. The inferior vena cava is normal in size with <50% respiratory  variability, suggesting right atrial pressure of 8 mmHg.     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   CKD (chronic kidney disease) stage 5, GFR less than 15 ml/min (HCC) 07/02/2021   COVID    has had it 2 times, one mild and one wasn't   Depression    DM2 (diabetes mellitus, type 2) (HCC)    Family history of adverse reaction to anesthesia    Dad has a hard time waking up after anesthesia   GERD (gastroesophageal reflux disease)    Hemorrhagic stroke (HCC) 04/30/2023   s/p right decompressive craniectomy and evacuation  of hematoma on 04/30/2023   History of blood transfusion    HTN (hypertension)    ICH (intracerebral hemorrhage) (HCC) 09/17/2023   Osteomyelitis of fifth toe of left foot (HCC) 08/13/2021   Osteomyelitis of fourth toe of left foot (HCC) 08/13/2021   Pneumonia    Sacral decubitus ulcer 05/2023   Stroke (HCC) 05/19/2021   unable to move left side    PSH:  Past Surgical History:  Procedure Laterality Date   AMPUTATION Left 08/29/2021   Procedure: AMPUTATION OF FOURTH TOE AND RAY ALONG WITH REMAINING FITH METATARSAL;  Surgeon: Bruce Little Kay HERO, MD;  Location: [facility] OR;  Service: Orthopedics;  Laterality: Left;   AMPUTATION Left 09/03/2021   Procedure: LISFRANC AMPUTATION;  Surgeon: Bruce Little Kay HERO, MD;  Location: [facility] OR;  Service: Orthopedics;  Laterality: Left;   AMPUTATION Left 10/29/2021   Procedure: AMPUTATION BELOW KNEE -LEFT;  Surgeon: Bruce Little Kay HERO, MD;  Location: [facility] OR;  Service: Orthopedics;  Laterality: Left;   APPLICATION OF WOUND VAC Left 07/07/2021   Procedure: APPLICATION OF WOUND VAC;  Surgeon: Bruce Little Kay HERO, MD;  Location: [facility] OR;  Service: Orthopedics;  Laterality: Left;   APPLICATION OF WOUND VAC Left 08/29/2021   Procedure: APPLICATION OF WOUND VAC;  Surgeon: Bruce Little Kay HERO, MD;  Location: [facility] OR;  Service: Orthopedics;  Laterality: Left;   AV FISTULA PLACEMENT Left 01/18/2024   Procedure: LEFT ARM ARTERIOVENOUS (AV) FISTULA CREATION;  Surgeon: Sheree Penne Bruckner, MD;  Location: [facility] OR;  Service: Vascular;  Laterality: Left;   DIALYSIS/PERMA CATHETER INSERTION N/A 12/28/2023   Procedure: DIALYSIS/PERMA CATHETER INSERTION;  Surgeon: Tobie Gordy POUR, MD;  Location: [facility] INVASIVE CV LAB;  Service: Cardiovascular;  Laterality: N/A;   DIALYSIS/PERMA CATHETER REMOVAL N/A 12/28/2023   Procedure: DIALYSIS/PERMA CATHETER REMOVAL;  Surgeon: Tobie Gordy POUR, MD;  Location: [facility] INVASIVE CV LAB;  Service: Cardiovascular;  Laterality: N/A;   I & D EXTREMITY Left 07/03/2021   Procedure: IRRIGATION  AND DEBRIDEMENT ,FIFTH RAY  AMPUTATION LEFT FOOT, , WOUND VAC PLACEMENT;  Surgeon: Bruce Little Kay HERO, MD;  Location: [facility] OR;  Service: Orthopedics;  Laterality: Left;   I & D EXTREMITY Left 07/07/2021   Procedure: IRRIGATION AND DEBRIDEMENT LEFT FOOT;  Surgeon: Bruce Little Kay HERO, MD;  Location: [facility] OR;  Service: Orthopedics;  Laterality: Left;   I & D EXTREMITY Left 08/29/2021   Procedure: IRRIGATION AND DEBRIDEMENT LEFT FOOT;  Surgeon: Bruce Little Kay HERO, MD;  Location: [facility] OR;  Service: Orthopedics;  Laterality: Left;   IR CYSTOSTOMY TUBE CHANGE COMPLICATED W IMG  02/21/2024   IR FLUORO GUIDE CV LINE RIGHT  07/09/2021   IR GASTROSTOMY TUBE REMOVAL  03/15/2024   IR RADIOLOGIST EVAL & MGMT  01/05/2024   suprapubic cath- pt's wife states the bleeding from that has resolved   IR REMOVAL TUN CV CATH W/O FL  10/08/2021   IR REMOVAL TUN CV CATH W/O FL  07/13/2023   IR REPLACE G-TUBE SIMPLE WO FLUORO  09/20/2023   IR US  GUIDE VASC ACCESS RIGHT  07/09/2021   TRACHEOSTOMY     05/14/2023 - 07/23/2023   VITRECTOMY Left    [place] [facility]    Social History:  Social History   Socioeconomic History   Marital status: Married    Spouse name: Bruce Little   Number of children: Not on file   Years of education: Not on file   Highest education level: Not on file  Occupational History   Not on file  Tobacco Use   Smoking status: Never   Smokeless tobacco: Never  Vaping Use   Vaping status: Never Used  Substance and Sexual Activity   Alcohol use: Not Currently   Drug use: Never   Sexual activity: Not on file  Other Topics Concern   Not on file  Social History Narrative   Not on file   Social Drivers of Health   Financial Resource Strain: Low Risk  (08/10/2023)   Received from [facility]   Overall Financial Resource Strain (CARDIA)    Difficulty of Paying Living Expenses: Not hard at all  Food Insecurity: No Food Insecurity (03/14/2024)   Hunger Vital Sign    Worried About Running Out of Food in the Last Year:  Never true    Ran Out of Food in the Last Year: Never true  Transportation Needs: No Transportation Needs (03/14/2024)   PRAPARE - Administrator, Civil Service (Medical): No    Lack of Transportation (Non-Medical): No  Physical Activity: Not on file  Stress: No Stress Concern Present (08/10/2023)   Received from [facility]   [employer] of Occupational Health - Occupational Stress Questionnaire    Feeling of Stress : Not at all  Social Connections: Unknown (08/13/2023)   Received from [facility]   Social Network    Social Network: Not on file  Intimate Partner Violence: Not At Risk (03/14/2024)   Humiliation, Afraid, Rape, and Kick questionnaire    Fear of Current or Ex-Partner: No    Emotionally Abused: No    Physically Abused: No    Sexually Abused: No    Family History:  Family History  Problem Relation Age of Onset   Stroke Mother    Cancer Mother    Heart disease Father     Medications:   Current Outpatient Medications on File Prior to Visit  Medication Sig Dispense Refill   acetaminophen  (TYLENOL ) 500 MG tablet Take 1,000 mg by mouth every 6 (six) hours as needed for mild pain (pain score 1-3) or headache.     albuterol  (VENTOLIN  HFA) 108 (90 Base) MCG/ACT inhaler Inhale 1 puff into the lungs every 6 (six) hours as needed for wheezing or shortness of breath.     amiodarone  (PACERONE ) 100 MG tablet Take 100 mg by mouth every evening.     amLODipine  (NORVASC ) 10 MG tablet Take 10 mg by mouth every evening.     atorvastatin  (LIPITOR ) 40 MG tablet Take 40 mg by mouth at bedtime.     baclofen  (LIORESAL ) 20 MG tablet Take 10 mg by mouth 2 (two) times daily.     carvedilol  (COREG ) 25 MG tablet Take 1 tablet (25 mg total) by mouth 2 (two) times daily with a meal. 60 tablet 0   collagenase  (SANTYL ) 250 UNIT/GM ointment Apply 1 Application topically daily.     Continuous Glucose Sensor (FREESTYLE LIBRE 3 PLUS SENSOR) MISC      DAKINS EX Apply 1 Application  topically daily.     diphenhydramine -acetaminophen  (TYLENOL  PM) 25-500 MG TABS tablet Take 2 tablets by mouth at bedtime as needed (For sleep).     doxycycline  (VIBRAMYCIN ) 100 MG capsule Take 100 mg by mouth 2 (two) times daily.     finasteride  (PROSCAR ) 5 MG tablet Take 5 mg by mouth daily.     fluconazole  (DIFLUCAN ) 150 MG tablet Take by mouth.     furosemide  (LASIX ) 40 MG tablet Take 40 mg by mouth daily.     LANTUS  SOLOSTAR 100 UNIT/ML Solostar Pen Inject 5 Units into the skin daily. (Patient taking differently: Inject 5 Units into the skin at bedtime.) 15 mL 0   levETIRAcetam  (KEPPRA ) 750 MG tablet Take 1,500 mg by mouth 2 (two) times daily.     losartan  (COZAAR ) 50 MG tablet Take 1 tablet (50 mg total) by mouth daily. 30 tablet 0   melatonin 5 MG TABS Take 5-10 mg by mouth at bedtime as needed (sleep).     methocarbamol  (ROBAXIN ) 500 MG tablet Take 1 tablet (500 mg total) by mouth every 8 (eight) hours as needed for muscle spasms. (Patient taking differently: Take 500 mg by mouth 2 (two) times daily. May take a third 500 mg dose midday as needed for  muscle spasms) 30 tablet 0   Multiple Vitamins-Minerals (EQ MULTIVITAMINS ADULT GUMMY PO) Take 1 tablet by mouth daily.     mupirocin  ointment (BACTROBAN ) 2 % Apply 1 Application topically 2 (two) times daily.     ondansetron  (ZOFRAN -ODT) 4 MG disintegrating tablet Take 4 mg by mouth every 8 (eight) hours as needed for nausea or vomiting.     pantoprazole  (PROTONIX ) 40 MG tablet Take 1 tablet (40 mg total) by mouth daily. 30 tablet 0   PARoxetine  (PAXIL ) 10 MG tablet Take 10 mg by mouth daily. (Patient taking differently: Take 20 mg by mouth daily.)     promethazine  (PHENERGAN ) 12.5 MG tablet Take 1 tablet (12.5 mg total) by mouth every 6 (six) hours as needed for nausea or vomiting. 60 tablet 0   promethazine  (PHENERGAN ) 25 MG suppository Place 1 suppository (25 mg total) rectally every 6 (six) hours as needed for nausea or vomiting. 12 each 0    senna-docusate (SENOKOT-S) 8.6-50 MG tablet Take 2 tablets by mouth 2 (two) times daily. (Patient taking differently: Take 2 tablets by mouth 2 (two) times daily as needed for mild constipation.) 120 tablet 0   sodium hypochlorite (DAKIN'S 1/2 STRENGTH) external solution Apply topically daily.     terazosin  (HYTRIN ) 1 MG capsule Take 1 mg by mouth at bedtime.     traZODone  (DESYREL ) 100 MG tablet Take 1 tablet (100 mg total) by mouth at bedtime as needed for sleep. 60 tablet 0   amoxicillin -clavulanate (AUGMENTIN ) 500-125 MG tablet Take 1 tablet by mouth 2 (two) times daily. In the event of increased wound drainage (Patient not taking: Reported on 06/12/2024) 30 tablet 0   multivitamin (RENA-VIT) TABS tablet Take 1 tablet by mouth at bedtime. (Patient not taking: Reported on 06/12/2024) 30 tablet 0   polyethylene glycol (MIRALAX  / GLYCOLAX ) 17 g packet Take 17 g by mouth 2 (two) times daily. (Patient not taking: Reported on 06/12/2024) 60 each 0   No current facility-administered medications on file prior to visit.    Allergies:  No Known Allergies    OBJECTIVE:  Physical Exam General: pleasant middle-age Caucasian male, seated, in no evident distress.  Status post right hemicraniectomy bone defect on his skull and wearing a helmet.  He does not have Head: head normocephalic and atraumatic.    Neurologic Exam Mental Status: Awake and fully alert.  Disoriented to place and time. Recent and remote memory diminished attention span, concentration and fund of knowledge poor e. Mood and affect appropriate.  Follows 1 and few two-step commands. Cranial Nerves: Right gaze deviation.  Able to look to the left past midline.  Left homonymous hemianopsia extraocular movements full without nystagmus. Hearing intact to voice. Facial sensation intact.  Mild right lower facial weakness.  Tongue, palate moves normally and symmetrically.  Shoulder shrug symmetric. Motor: Dense spastic left hemiplegia 0/5 strength.   Right upper extremity strength is normal.  Right lower extremity strength is 3-4/5 with drift with right foot drop. Diminished sensation in the left hemibody from lower face down.  Normal sensation in the right. Coordination normal on the right unable to test on the left. Gait patient unable to walk  NIH stroke scale 15 Modified Rankin scale score 4      ASSESSMENT: Bruce Little is a 53 y.o. 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 Vascular risk factors include HTN, HLD, DM, prior strokes  on imaging and R>L carotid stenosis.  Hospitalized in August for sepsis in setting of left lower necrotizing infection and postop PAF with RVR. S/p L BKA 10/2021.  Recent hospitalization in [place]  from June to August 2024 for large right frontoparietal parenchymal hemorrhage due to hypertension and anticoagulation status post decompressive hemicraniectomy with a prolonged hospital course complicated by meningitis ,seizures, sacral ulcers, C. difficile colitis and UTI.  He has significant residual spastic left hemiplegia.  Recent admission in May 2025 due to altered mental status due to metabolic encephalopathy possibly due to UTI.  There was concern for seizures but long-term EEG monitoring was negative    PLAN:  I had a long d/w patient and his wife about his recent hemorrhagic stroke, decompressive hemicraniectomy and residual spastic left hemiplegia, symptomatic seizures, risk for recurrent stroke/TIAs, personally independently reviewed imaging studies and stroke evaluation results and answered questions.recommend aspirin  81 mg daily   for secondary stroke prevention and maintain strict control of hypertension with blood pressure goal below 130/90, diabetes with hemoglobin A1c goal below 6.5% and lipids with LDL cholesterol goal below 70 mg/dL. I also advised the patient to eat a healthy diet  with plenty of whole grains, cereals, fruits and vegetables, exercise regularly and maintain ideal body weight .continue ongoing physical occupational and speech therapy.  Continue Keppra  in the current dose of 1500 mg  twice daily for seizure prophylaxis.  I recommend patient start taking melatonin 10 mg at [facility] and increase trazodone  to 150 mg at night to help with sleep.  He has an upcoming appointment with primary care physician advised him to discuss his multiple blood pressure medications and sleeping concerns as well.  Followup in the future with with me only as necessary or call earlier if necessary.      CC:  PCP: Street, Lonni HERO, MD    I spent 50 minutes of face-to-face and non-face-to-face time with patient and his wife with extensive review of electronic medical records from his recent hospitalization for intracerebral hemorrhage in [place]  and this included previsit chart review, lab review, study review, electronic health record documentation, patient education regarding prior stroke, secondary stroke prevention measures and importance of managing stroke risk factors,  and answered all other questions to patients satisfaction  Eather Popp, MD  [facility] [address] [place], [place] [address]  Phone [phone] Fax [phone] Note: This document was prepared with digital dictation and possible smart phrase technology. Any transcriptional errors that result from this process are unintentional.

## 2024-06-14 ENCOUNTER — Encounter (HOSPITAL_BASED_OUTPATIENT_CLINIC_OR_DEPARTMENT_OTHER): Attending: Internal Medicine | Admitting: Internal Medicine

## 2024-06-14 DIAGNOSIS — L89154 Pressure ulcer of sacral region, stage 4: Secondary | ICD-10-CM | POA: Diagnosis not present

## 2024-06-14 DIAGNOSIS — E1122 Type 2 diabetes mellitus with diabetic chronic kidney disease: Secondary | ICD-10-CM | POA: Diagnosis not present

## 2024-06-14 DIAGNOSIS — N186 End stage renal disease: Secondary | ICD-10-CM | POA: Diagnosis not present

## 2024-06-14 DIAGNOSIS — S81801A Unspecified open wound, right lower leg, initial encounter: Secondary | ICD-10-CM | POA: Diagnosis not present

## 2024-06-14 DIAGNOSIS — L89610 Pressure ulcer of right heel, unstageable: Secondary | ICD-10-CM | POA: Diagnosis not present

## 2024-06-14 DIAGNOSIS — X58XXXA Exposure to other specified factors, initial encounter: Secondary | ICD-10-CM | POA: Diagnosis not present

## 2024-06-14 DIAGNOSIS — L89892 Pressure ulcer of other site, stage 2: Secondary | ICD-10-CM | POA: Diagnosis not present

## 2024-06-14 DIAGNOSIS — Z89512 Acquired absence of left leg below knee: Secondary | ICD-10-CM | POA: Insufficient documentation

## 2024-06-14 DIAGNOSIS — L89324 Pressure ulcer of left buttock, stage 4: Secondary | ICD-10-CM | POA: Diagnosis not present

## 2024-06-14 DIAGNOSIS — S80811A Abrasion, right lower leg, initial encounter: Secondary | ICD-10-CM | POA: Diagnosis not present

## 2024-06-30 ENCOUNTER — Encounter (HOSPITAL_BASED_OUTPATIENT_CLINIC_OR_DEPARTMENT_OTHER): Admitting: Internal Medicine

## 2024-06-30 DIAGNOSIS — L89154 Pressure ulcer of sacral region, stage 4: Secondary | ICD-10-CM

## 2024-06-30 DIAGNOSIS — E11621 Type 2 diabetes mellitus with foot ulcer: Secondary | ICD-10-CM | POA: Diagnosis not present

## 2024-06-30 DIAGNOSIS — L89610 Pressure ulcer of right heel, unstageable: Secondary | ICD-10-CM

## 2024-06-30 DIAGNOSIS — L89324 Pressure ulcer of left buttock, stage 4: Secondary | ICD-10-CM

## 2024-07-10 ENCOUNTER — Other Ambulatory Visit: Payer: Self-pay | Admitting: Internal Medicine

## 2024-07-11 NOTE — Telephone Encounter (Signed)
Does this need to be refilled? 

## 2024-07-14 ENCOUNTER — Encounter (HOSPITAL_BASED_OUTPATIENT_CLINIC_OR_DEPARTMENT_OTHER): Admitting: Internal Medicine

## 2024-07-19 ENCOUNTER — Encounter (HOSPITAL_BASED_OUTPATIENT_CLINIC_OR_DEPARTMENT_OTHER): Attending: Internal Medicine | Admitting: Internal Medicine

## 2024-07-19 DIAGNOSIS — L89892 Pressure ulcer of other site, stage 2: Secondary | ICD-10-CM | POA: Diagnosis not present

## 2024-07-19 DIAGNOSIS — S81801A Unspecified open wound, right lower leg, initial encounter: Secondary | ICD-10-CM | POA: Diagnosis not present

## 2024-07-19 DIAGNOSIS — X58XXXA Exposure to other specified factors, initial encounter: Secondary | ICD-10-CM | POA: Diagnosis not present

## 2024-07-19 DIAGNOSIS — E11622 Type 2 diabetes mellitus with other skin ulcer: Secondary | ICD-10-CM | POA: Diagnosis not present

## 2024-07-19 DIAGNOSIS — L89324 Pressure ulcer of left buttock, stage 4: Secondary | ICD-10-CM | POA: Diagnosis not present

## 2024-07-19 DIAGNOSIS — S80811A Abrasion, right lower leg, initial encounter: Secondary | ICD-10-CM | POA: Insufficient documentation

## 2024-07-19 DIAGNOSIS — I480 Paroxysmal atrial fibrillation: Secondary | ICD-10-CM | POA: Insufficient documentation

## 2024-07-19 DIAGNOSIS — L89154 Pressure ulcer of sacral region, stage 4: Secondary | ICD-10-CM | POA: Insufficient documentation

## 2024-07-19 DIAGNOSIS — N186 End stage renal disease: Secondary | ICD-10-CM | POA: Insufficient documentation

## 2024-07-19 DIAGNOSIS — L89610 Pressure ulcer of right heel, unstageable: Secondary | ICD-10-CM | POA: Diagnosis not present

## 2024-07-19 DIAGNOSIS — E43 Unspecified severe protein-calorie malnutrition: Secondary | ICD-10-CM | POA: Insufficient documentation

## 2024-07-19 DIAGNOSIS — E11621 Type 2 diabetes mellitus with foot ulcer: Secondary | ICD-10-CM | POA: Insufficient documentation

## 2024-07-19 DIAGNOSIS — Z794 Long term (current) use of insulin: Secondary | ICD-10-CM | POA: Diagnosis not present

## 2024-07-19 DIAGNOSIS — Z89512 Acquired absence of left leg below knee: Secondary | ICD-10-CM | POA: Diagnosis not present

## 2024-07-19 DIAGNOSIS — Z6822 Body mass index (BMI) 22.0-22.9, adult: Secondary | ICD-10-CM | POA: Insufficient documentation

## 2024-07-19 DIAGNOSIS — E1122 Type 2 diabetes mellitus with diabetic chronic kidney disease: Secondary | ICD-10-CM | POA: Insufficient documentation

## 2024-07-19 DIAGNOSIS — Z8673 Personal history of transient ischemic attack (TIA), and cerebral infarction without residual deficits: Secondary | ICD-10-CM | POA: Insufficient documentation

## 2024-07-28 ENCOUNTER — Encounter (HOSPITAL_BASED_OUTPATIENT_CLINIC_OR_DEPARTMENT_OTHER): Admitting: Internal Medicine

## 2024-08-02 ENCOUNTER — Encounter (HOSPITAL_COMMUNITY)
Admission: RE | Disposition: A | Discharge status: Home / Self Care | Payer: Self-pay | Source: Home / Self Care | Attending: Surgery

## 2024-08-02 ENCOUNTER — Ambulatory Visit (HOSPITAL_COMMUNITY)
Admission: RE | Admit: 2024-08-02 | Discharge: 2024-08-02 | Disposition: A | Discharge status: Home / Self Care | Attending: Surgery | Admitting: Surgery

## 2024-08-02 ENCOUNTER — Other Ambulatory Visit: Payer: Self-pay

## 2024-08-02 ENCOUNTER — Encounter (HOSPITAL_BASED_OUTPATIENT_CLINIC_OR_DEPARTMENT_OTHER): Admitting: Internal Medicine

## 2024-08-02 ENCOUNTER — Encounter (HOSPITAL_COMMUNITY): Payer: Self-pay | Admitting: Surgery

## 2024-08-02 DIAGNOSIS — E1122 Type 2 diabetes mellitus with diabetic chronic kidney disease: Secondary | ICD-10-CM | POA: Insufficient documentation

## 2024-08-02 DIAGNOSIS — L89324 Pressure ulcer of left buttock, stage 4: Secondary | ICD-10-CM

## 2024-08-02 DIAGNOSIS — L89610 Pressure ulcer of right heel, unstageable: Secondary | ICD-10-CM | POA: Diagnosis not present

## 2024-08-02 DIAGNOSIS — E11622 Type 2 diabetes mellitus with other skin ulcer: Secondary | ICD-10-CM

## 2024-08-02 DIAGNOSIS — Z992 Dependence on renal dialysis: Secondary | ICD-10-CM | POA: Insufficient documentation

## 2024-08-02 DIAGNOSIS — L89892 Pressure ulcer of other site, stage 2: Secondary | ICD-10-CM | POA: Diagnosis not present

## 2024-08-02 DIAGNOSIS — S81801A Unspecified open wound, right lower leg, initial encounter: Secondary | ICD-10-CM

## 2024-08-02 DIAGNOSIS — I120 Hypertensive chronic kidney disease with stage 5 chronic kidney disease or end stage renal disease: Secondary | ICD-10-CM | POA: Insufficient documentation

## 2024-08-02 DIAGNOSIS — L89154 Pressure ulcer of sacral region, stage 4: Secondary | ICD-10-CM | POA: Diagnosis not present

## 2024-08-02 DIAGNOSIS — E11621 Type 2 diabetes mellitus with foot ulcer: Secondary | ICD-10-CM

## 2024-08-02 DIAGNOSIS — T82858A Stenosis of vascular prosthetic devices, implants and grafts, initial encounter: Secondary | ICD-10-CM | POA: Insufficient documentation

## 2024-08-02 DIAGNOSIS — N186 End stage renal disease: Secondary | ICD-10-CM | POA: Diagnosis present

## 2024-08-02 DIAGNOSIS — I871 Compression of vein: Secondary | ICD-10-CM | POA: Insufficient documentation

## 2024-08-02 DIAGNOSIS — Y832 Surgical operation with anastomosis, bypass or graft as the cause of abnormal reaction of the patient, or of later complication, without mention of misadventure at the time of the procedure: Secondary | ICD-10-CM | POA: Diagnosis not present

## 2024-08-02 HISTORY — PX: VENOUS ANGIOPLASTY: CATH118376

## 2024-08-02 HISTORY — PX: A/V FISTULAGRAM: CATH118298

## 2024-08-02 SURGERY — A/V FISTULAGRAM
Anesthesia: LOCAL | Site: Arm Upper | Laterality: Left

## 2024-08-02 MED ORDER — LIDOCAINE HCL (PF) 1 % IJ SOLN
INTRAMUSCULAR | Status: AC
  Filled 2024-08-02: qty 30

## 2024-08-02 MED ORDER — IODIXANOL 320 MG/ML IV SOLN
INTRAVENOUS | Status: DC | PRN
  Administered 2024-08-02: 25 mL via INTRAVENOUS

## 2024-08-02 MED ORDER — LIDOCAINE HCL (PF) 1 % IJ SOLN
INTRAMUSCULAR | Status: DC | PRN
  Administered 2024-08-02: 5 mL via INTRADERMAL

## 2024-08-02 MED ORDER — HEPARIN (PORCINE) IN NACL 1000-0.9 UT/500ML-% IV SOLN
INTRAVENOUS | Status: DC | PRN
  Administered 2024-08-02: 500 mL

## 2024-08-02 SURGICAL SUPPLY — 9 items
BALLOON MUSTANG 8X60X75 (BALLOONS) IMPLANT
KIT ENCORE 26 ADVANTAGE (KITS) IMPLANT
KIT MICROPUNCTURE NIT STIFF (SHEATH) IMPLANT
KIT PV (KITS) ×2 IMPLANT
MAT PREVALON FULL STRYKER (MISCELLANEOUS) IMPLANT
SHEATH PINNACLE R/O II 6F 4CM (SHEATH) IMPLANT
TRAY PV CATH (CUSTOM PROCEDURE TRAY) ×2 IMPLANT
TUBING CIL FLEX 10 FLL-RA (TUBING) IMPLANT
WIRE BENTSON .035X145CM (WIRE) IMPLANT

## 2024-08-02 NOTE — H&P (Signed)
 [facility] [place]  Patient name: Bruce Little MRN: [id number] DOB: September 19, 1971 Sex: male   REASON FOR VISIT:    Dialysis access  HISOTRY OF PRESENT ILLNESS:    Bruce Little is a 53 y.o. male who is status post left upper arm brachiocephalic fistula created on 01/18/2024 by Dr. Sheree.  He is having trouble with flow rates and is here today for fistulogram.  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 05/03/2023   CKD (chronic kidney disease) stage 5, GFR less than 15 ml/min (HCC) 07/02/2021   COVID    has had it 2 times, one mild and one wasn't   Depression    DM2 (diabetes mellitus, type 2) (HCC)    Family history of adverse reaction to anesthesia    Dad has a hard time waking up after anesthesia   GERD (gastroesophageal reflux disease)    Hemorrhagic stroke (HCC) 04/30/2023   s/p right decompressive craniectomy and evacuation of hematoma on 04/30/2023   History of blood transfusion    HTN (hypertension)    ICH (intracerebral hemorrhage) (HCC) 09/17/2023   Osteomyelitis of fifth toe of left foot (HCC) 08/13/2021   Osteomyelitis of fourth toe of left foot (HCC) 08/13/2021   Pneumonia    Sacral decubitus ulcer 05/2023   Stroke (HCC) 05/19/2021   unable to move left side     FAMILY HISTORY:   Family History  Problem Relation Age of Onset   Stroke Mother    Cancer Mother    Heart disease Father     SOCIAL HISTORY:   Social History   Tobacco Use   Smoking status: Never   Smokeless tobacco: Never  Substance Use Topics   Alcohol use: Not Currently     ALLERGIES:   No Known Allergies   CURRENT MEDICATIONS:   No  current facility-administered medications for this encounter.    REVIEW OF SYSTEMS:   [X]  denotes positive finding, [ ]  denotes negative finding Cardiac  Comments:  Chest pain or chest pressure:    Shortness of breath upon exertion:    Short of breath when lying flat:    Irregular heart rhythm:        Vascular    Pain in calf, thigh, or hip brought on by ambulation:    Pain in feet at night that wakes you up from your sleep:     Blood clot in your veins:    Leg swelling:         Pulmonary    Oxygen at home:    Productive cough:     Wheezing:         Neurologic    Sudden weakness in arms or legs:     Sudden numbness in arms or legs:     Sudden onset of difficulty speaking or slurred speech:    Temporary loss of vision in one eye:     Problems with dizziness:         Gastrointestinal    Blood in stool:     Vomited blood:         Genitourinary    Burning when urinating:  Blood in urine:        Psychiatric    Major depression:         Hematologic    Bleeding problems:    Problems with blood clotting too easily:        Skin    Rashes or ulcers:        Constitutional    Fever or chills:      PHYSICAL EXAM:   Vitals:   08/02/24 0859 08/02/24 0906  BP: (!) 136/56 (!) 133/58  Pulse: 65 65  Resp: 12 16  Temp: (!) 97.5 F (36.4 C)   TempSrc: Oral   SpO2: 96% 94%    GENERAL: The patient is a well-nourished male, in no acute distress. The vital signs are documented above. CARDIAC: There is a regular rate and rhythm.  VASCULAR: Palpable thrill within left arm fistula PULMONARY: Non-labored respirations  STUDIES:     MEDICAL ISSUES:   ESRD: We discussed proceeding with fistulogram and intervention as indicated.  The details the procedure discussed the patient and he agrees.  All questions were answered.    Malvina Serene CLORE, MD, FACS [facility] [phone] Pager [phone]

## 2024-08-02 NOTE — Op Note (Signed)
    Patient name: Bruce Little MRN: [id number] DOB: 10/21/1971 Sex: male  08/02/2024 Pre-operative Diagnosis: ESRD Post-operative diagnosis:  Same Surgeon:  Malvina New Procedure Performed:  1.  Ultrasound-guided access, left arm fistula  2.  Fistulogram  3.  Venoplasty, left axillary vein (central)  4.  Venoplasty, left cephalic vein (peripheral)   Indications: This is a 53 year old gentleman with end-stage renal disease who has a left fistula that infiltrated.  He has been having some trouble since then and comes in for fistulogram.  Procedure:  The patient was identified in the holding area and taken to room 8.  The patient was then placed supine on the table and prepped and draped in the usual sterile fashion.  A time out was called.  Ultrasound was used to evaluate the fistula.  The vein was patent and compressible.  A digital ultrasound image was acquired.  The fistula was then accessed under ultrasound guidance using a micropuncture needle.  An 018 wire was then asvanced without resistance and a micropuncture sheath was placed.  Contrast injections were then performed through the sheath.  Findings: The SVC and left innominate vein and left subclavian vein are widely patent.  At the junction between the axillary vein and the cephalic vein there are 2 areas of stenosis at the junction and then more peripherally in the cephalic vein.  These are greater than 60%.  The remaining portion of the fistula is widely patent.  There is a large branch near the arterial anastomosis that prevented good evaluation of the arterial venous anastomosis.   Intervention: After the above images were acquired the decision made to proceed with intervention.  A 6 French sheath was placed.  A Bentson wire was advanced across the lesion.  I selected an 8 x 60 Mustang balloon and perform venoplasty of the axillary vein as well as the cephalic vein at the arch.  Completion imaging showed resolution of the stenosis.   Catheters and wires were removed.  A suture was used to close the access site.  There were no immediate complications  Impression:  #1  Successful venoplasty of the left axillary and cephalic vein at the level of the arch using an 8 mm balloon  #2  Access remains amenable to percutaneous intervention   V. Malvina New, M.D., [facility] [place] Office: [phone] Pager:  [phone]

## 2024-08-09 ENCOUNTER — Other Ambulatory Visit: Payer: Self-pay | Admitting: Neurology

## 2024-08-09 NOTE — Telephone Encounter (Signed)
 Last filled by patient on 08/08/24 Last office visit : 06/12/24 Next office visit : per 06/12/24 note - Followup in the future with with me only as necessary or call earlier if necessary. last sent in by provider 06/12/24   Please advise on 90 day refill request

## 2024-08-14 ENCOUNTER — Other Ambulatory Visit: Payer: Self-pay | Admitting: Neurosurgery

## 2024-08-16 ENCOUNTER — Ambulatory Visit (HOSPITAL_BASED_OUTPATIENT_CLINIC_OR_DEPARTMENT_OTHER): Admitting: Internal Medicine

## 2024-08-30 ENCOUNTER — Encounter (HOSPITAL_BASED_OUTPATIENT_CLINIC_OR_DEPARTMENT_OTHER): Attending: Internal Medicine | Admitting: Internal Medicine

## 2024-08-30 DIAGNOSIS — N186 End stage renal disease: Secondary | ICD-10-CM | POA: Diagnosis not present

## 2024-08-30 DIAGNOSIS — S80811A Abrasion, right lower leg, initial encounter: Secondary | ICD-10-CM | POA: Diagnosis not present

## 2024-08-30 DIAGNOSIS — E1122 Type 2 diabetes mellitus with diabetic chronic kidney disease: Secondary | ICD-10-CM | POA: Diagnosis not present

## 2024-08-30 DIAGNOSIS — L89154 Pressure ulcer of sacral region, stage 4: Secondary | ICD-10-CM | POA: Diagnosis not present

## 2024-08-30 DIAGNOSIS — S81801A Unspecified open wound, right lower leg, initial encounter: Secondary | ICD-10-CM | POA: Insufficient documentation

## 2024-08-30 DIAGNOSIS — L89892 Pressure ulcer of other site, stage 2: Secondary | ICD-10-CM | POA: Insufficient documentation

## 2024-08-30 DIAGNOSIS — L89610 Pressure ulcer of right heel, unstageable: Secondary | ICD-10-CM

## 2024-08-30 DIAGNOSIS — L89324 Pressure ulcer of left buttock, stage 4: Secondary | ICD-10-CM | POA: Diagnosis not present

## 2024-08-30 DIAGNOSIS — Z89512 Acquired absence of left leg below knee: Secondary | ICD-10-CM | POA: Diagnosis not present

## 2024-08-30 DIAGNOSIS — E11621 Type 2 diabetes mellitus with foot ulcer: Secondary | ICD-10-CM | POA: Insufficient documentation

## 2024-08-30 DIAGNOSIS — E11622 Type 2 diabetes mellitus with other skin ulcer: Secondary | ICD-10-CM | POA: Diagnosis present

## 2024-08-30 LAB — GLUCOSE, CAPILLARY: Glucose-Capillary: 104 mg/dL — ABNORMAL HIGH (ref 70–99)

## 2024-09-04 ENCOUNTER — Encounter (HOSPITAL_COMMUNITY): Payer: Self-pay

## 2024-09-05 NOTE — Pre-Procedure Instructions (Signed)
 Surgical Instructions   Your procedure is scheduled on Tuesday, November 18th.  Report to [facility] Main Entrance A at 0900 A.M., then check in with the Admitting office. Any questions or running late day of surgery: call [phone]  Questions prior to your surgery date: call [phone], Monday-Friday, 8am-4pm. If you experience any cold or flu symptoms such as cough, fever, chills, shortness of breath, etc. between now and your scheduled surgery, please notify us  at the above number.     Remember:  Do not eat or drink after midnight the night before your surgery    Take these medicines the morning of surgery with A SIP OF WATER : carvedilol  (COREG )  levETIRAcetam  (KEPPRA )   May take these medicines IF NEEDED: acetaminophen  (TYLENOL )  albuterol  (VENTOLIN  HFA) - please bring it to the hospital  methocarbamol  (ROBAXIN )  ondansetron  (ZOFRAN -ODT  promethazine  (PHENERGAN )   One week prior to surgery, STOP taking any Aspirin  (unless otherwise instructed by your surgeon) Aleve, Naproxen, Ibuprofen, Motrin, Advil, Goody's, BC's, all herbal medications, fish oil, and non-prescription vitamins.  WHAT DO I DO ABOUT MY DIABETES MEDICATION?   Do not take oral diabetes medicines (pills) the morning of surgery.  THE NIGHT BEFORE SURGERY, take 2 units of LANTUS  insulin  If blood glucose greater than 150.       HOW TO MANAGE YOUR DIABETES BEFORE AND AFTER SURGERY  Why is it important to control my blood sugar before and after surgery? Improving blood sugar levels before and after surgery helps healing and can limit problems. A way of improving blood sugar control is eating a healthy diet by:  Eating less sugar and carbohydrates  Increasing activity/exercise  Talking with your doctor about reaching your blood sugar goals High blood sugars (greater than 180 mg/dL) can raise your risk of infections and slow your recovery, so you will need to focus on controlling your diabetes during  the weeks before surgery. Make sure that the doctor who takes care of your diabetes knows about your planned surgery including the date and location.  How do I manage my blood sugar before surgery? Check your blood sugar at least 4 times a day, starting 2 days before surgery, to make sure that the level is not too high or low.  Check your blood sugar the morning of your surgery when you wake up and every 2 hours until you get to the Short Stay unit.  If your blood sugar is less than 70 mg/dL, you will need to treat for low blood sugar: Do not take insulin . Treat a low blood sugar (less than 70 mg/dL) with  cup of clear juice (cranberry or apple), 4 glucose tablets, OR glucose gel. Recheck blood sugar in 15 minutes after treatment (to make sure it is greater than 70 mg/dL). If your blood sugar is not greater than 70 mg/dL on recheck, call [phone] for further instructions. Report your blood sugar to the short stay nurse when you get to Short Stay.  If you are admitted to the hospital after surgery: Your blood sugar will be checked by the staff and you will probably be given insulin  after surgery (instead of oral diabetes medicines) to make sure you have good blood sugar levels. The goal for blood sugar control after surgery is 80-180 mg/dL.                      Do NOT Smoke (Tobacco/Vaping) for 24 hours prior to your procedure.  If you use a CPAP  at night, you may bring your mask/headgear for your overnight stay.   You will be asked to remove any contacts, glasses, piercing's, hearing aid's, dentures/partials prior to surgery. Please bring cases for these items if needed.    Patients discharged the day of surgery will not be allowed to drive home, and someone needs to stay with them for 24 hours.  SURGICAL WAITING ROOM VISITATION Patients may have no more than 2 support people in the waiting area - these visitors may rotate.   Pre-op  nurse will coordinate an appropriate time for 1  ADULT support person, who may not rotate, to accompany patient in pre-op .  Children under the age of 33 must have an adult with them who is not the patient and must remain in the main waiting area with an adult.  If the patient needs to stay at the hospital during part of their recovery, the visitor guidelines for inpatient rooms apply.  Please refer to the [facility] website for the visitor guidelines for any additional information.   If you received a COVID test during your pre-op  visit  it is requested that you wear a mask when out in public, stay away from anyone that may not be feeling well and notify your surgeon if you develop symptoms. If you have been in contact with anyone that has tested positive in the last 10 days please notify you surgeon.      Pre-operative CHG Bathing Instructions   You can play a key role in reducing the risk of infection after surgery. Your skin needs to be as free of germs as possible. You can reduce the number of germs on your skin by washing with CHG (chlorhexidine  gluconate) soap before surgery. CHG is an antiseptic soap that kills germs and continues to kill germs even after washing.   DO NOT use if you have an allergy to chlorhexidine /CHG or antibacterial soaps. If your skin becomes reddened or irritated, stop using the CHG and notify one of our RNs at [phone].              TAKE A SHOWER THE NIGHT BEFORE SURGERY   Please keep in mind the following:  DO NOT shave, including legs and underarms, 48 hours prior to surgery.   You may shave your face before/day of surgery.  Place clean sheets on your bed the night before surgery Use a clean washcloth (not used since being washed) for shower. DO NOT sleep with pet's night before surgery.  CHG Shower Instructions:  Wash your face and private area with normal soap. If you choose to wash your hair, wash first with your normal shampoo.  After you use shampoo/soap, rinse your hair and body thoroughly  to remove shampoo/soap residue.  Turn the water  OFF and apply half the bottle of CHG soap to a CLEAN washcloth.  Apply CHG soap ONLY FROM YOUR NECK DOWN TO YOUR TOES (washing for 3-5 minutes)  DO NOT use CHG soap on face, private areas, open wounds, or sores.  Pay special attention to the area where your surgery is being performed.  If you are having back surgery, having someone wash your back for you may be helpful. Wait 2 minutes after CHG soap is applied, then you may rinse off the CHG soap.  Pat dry with a clean towel  Put on clean pajamas    Additional instructions for the day of surgery: If you choose, you may shower the morning of surgery with an antibacterial soap.  DO NOT APPLY any lotions, deodorants, cologne, or perfumes.   Do not wear jewelry or makeup Do not wear nail polish, gel polish, artificial nails, or any other type of covering on natural nails (fingers and toes) Do not bring valuables to the hospital. [facility] is not responsible for valuables/personal belongings. Put on clean/comfortable clothes.  Please brush your teeth.  Ask your nurse before applying any prescription medications to the skin.

## 2024-09-06 ENCOUNTER — Inpatient Hospital Stay (HOSPITAL_COMMUNITY)
Admission: RE | Admit: 2024-09-06 | Discharge: 2024-09-06 | Disposition: A | Discharge status: Home / Self Care | Source: Ambulatory Visit

## 2024-09-13 ENCOUNTER — Encounter (HOSPITAL_BASED_OUTPATIENT_CLINIC_OR_DEPARTMENT_OTHER): Admitting: Internal Medicine

## 2024-09-19 ENCOUNTER — Other Ambulatory Visit (HOSPITAL_COMMUNITY)

## 2024-09-20 ENCOUNTER — Encounter (HOSPITAL_BASED_OUTPATIENT_CLINIC_OR_DEPARTMENT_OTHER): Admitting: Internal Medicine

## 2024-09-21 ENCOUNTER — Encounter (HOSPITAL_COMMUNITY): Payer: Self-pay

## 2024-09-26 ENCOUNTER — Inpatient Hospital Stay (HOSPITAL_COMMUNITY): Admit: 2024-09-26 | Admitting: Neurosurgery

## 2024-09-26 SURGERY — CRANIOTOMY BONE FLAP/PROSTHETIC PLATE
Anesthesia: General | Laterality: Right

## 2024-10-09 DEATH — deceased

## 2024-11-07 ENCOUNTER — Encounter (HOSPITAL_COMMUNITY): Payer: Self-pay

## 2024-12-13 ENCOUNTER — Encounter (HOSPITAL_COMMUNITY): Payer: Self-pay
